# Patient Record
Sex: Female | Born: 1975 | Race: White | Hispanic: No | Marital: Married | Smoking: Current every day smoker
Health system: Southern US, Community
[De-identification: ages and names within clinical notes are randomized; demographics above are authoritative.]

## PROBLEM LIST (undated history)

## (undated) DIAGNOSIS — Z1371 Encounter for nonprocreative screening for genetic disease carrier status: Secondary | ICD-10-CM

## (undated) DIAGNOSIS — Z9221 Personal history of antineoplastic chemotherapy: Secondary | ICD-10-CM

## (undated) DIAGNOSIS — J189 Pneumonia, unspecified organism: Secondary | ICD-10-CM

## (undated) DIAGNOSIS — M5136 Other intervertebral disc degeneration, lumbar region: Secondary | ICD-10-CM

## (undated) DIAGNOSIS — M51369 Other intervertebral disc degeneration, lumbar region without mention of lumbar back pain or lower extremity pain: Secondary | ICD-10-CM

## (undated) DIAGNOSIS — IMO0002 Reserved for concepts with insufficient information to code with codable children: Secondary | ICD-10-CM

## (undated) DIAGNOSIS — I10 Essential (primary) hypertension: Secondary | ICD-10-CM

## (undated) DIAGNOSIS — Z923 Personal history of irradiation: Secondary | ICD-10-CM

## (undated) DIAGNOSIS — C50919 Malignant neoplasm of unspecified site of unspecified female breast: Secondary | ICD-10-CM

## (undated) HISTORY — PX: TUBAL LIGATION: SHX77

## (undated) HISTORY — PX: BREAST SURGERY: SHX581

## (undated) HISTORY — PX: PORT A CATH REVISION: SHX6033

## (undated) HISTORY — DX: Encounter for nonprocreative screening for genetic disease carrier status: Z13.71

## (undated) HISTORY — PX: OTHER SURGICAL HISTORY: SHX169

---

## 2004-08-23 ENCOUNTER — Emergency Department: Payer: Self-pay | Admitting: Unknown Physician Specialty

## 2005-08-21 ENCOUNTER — Ambulatory Visit: Payer: Self-pay | Admitting: Family Medicine

## 2005-09-22 ENCOUNTER — Ambulatory Visit: Payer: Self-pay | Admitting: Psychiatry

## 2006-08-03 HISTORY — PX: SPINE SURGERY: SHX786

## 2009-10-09 ENCOUNTER — Ambulatory Visit: Payer: Self-pay | Admitting: Family Medicine

## 2009-11-26 ENCOUNTER — Ambulatory Visit: Payer: Self-pay | Admitting: Family Medicine

## 2009-11-29 ENCOUNTER — Ambulatory Visit: Payer: Self-pay | Admitting: Family Medicine

## 2009-12-13 ENCOUNTER — Ambulatory Visit (HOSPITAL_COMMUNITY): Admission: RE | Admit: 2009-12-13 | Discharge: 2009-12-13 | Payer: Self-pay | Admitting: Neurosurgery

## 2010-01-05 ENCOUNTER — Inpatient Hospital Stay (HOSPITAL_COMMUNITY): Admission: EM | Admit: 2010-01-05 | Discharge: 2010-01-09 | Payer: Self-pay | Admitting: Neurosurgery

## 2010-05-09 ENCOUNTER — Encounter: Admission: RE | Admit: 2010-05-09 | Discharge: 2010-05-09 | Payer: Self-pay | Admitting: Neurosurgery

## 2010-10-20 LAB — BASIC METABOLIC PANEL
BUN: 5 mg/dL — ABNORMAL LOW (ref 6–23)
Calcium: 9.4 mg/dL (ref 8.4–10.5)
Creatinine, Ser: 0.65 mg/dL (ref 0.4–1.2)
GFR calc Af Amer: >60 mL/min (ref >60)
GFR calc non Af Amer: >60 mL/min (ref >60)

## 2010-10-20 LAB — CBC
HCT: 42.6 % (ref 36.0–46.0)
Hemoglobin: 14.5 g/dL (ref 12.0–15.0)
MCHC: 34.1 g/dL (ref 30.0–36.0)
MCV: 95.7 fL (ref 78.0–100.0)
Platelets: 227 10*3/uL (ref 150–400)
RBC: 4.46 MIL/uL (ref 3.87–5.11)
RDW: 15.3 % (ref 11.5–15.5)
WBC: 14.4 10*3/uL — ABNORMAL HIGH (ref 4.0–10.5)

## 2010-10-20 LAB — DIFFERENTIAL
Eosinophils Absolute: 0.1 10*3/uL (ref 0.0–0.7)
Lymphs Abs: 2.5 10*3/uL (ref 0.7–4.0)
Monocytes Relative: 6 % (ref 3–12)
Neutro Abs: 10.9 10*3/uL — ABNORMAL HIGH (ref 1.7–7.7)
Neutrophils Relative %: 75 % (ref 43–77)

## 2010-10-20 LAB — PROTIME-INR
INR: 0.91 (ref 0.00–1.49)
Prothrombin Time: 12.2 seconds (ref 11.6–15.2)

## 2010-10-20 LAB — APTT: aPTT: 27 seconds (ref 24–37)

## 2010-10-21 LAB — CBC
HCT: 43.3 % (ref 36.0–46.0)
Hemoglobin: 14.9 g/dL (ref 12.0–15.0)
MCHC: 34.3 g/dL (ref 30.0–36.0)
MCV: 95.9 fL (ref 78.0–100.0)
Platelets: 216 10*3/uL (ref 150–400)
RBC: 4.51 MIL/uL (ref 3.87–5.11)
RDW: 14.5 % (ref 11.5–15.5)
WBC: 10.6 10*3/uL — ABNORMAL HIGH (ref 4.0–10.5)

## 2010-10-21 LAB — SURGICAL PCR SCREEN
MRSA, PCR: NEGATIVE
Staphylococcus aureus: NEGATIVE

## 2010-10-27 ENCOUNTER — Encounter: Payer: Commercial Indemnity | Attending: Physical Medicine & Rehabilitation

## 2010-10-27 ENCOUNTER — Ambulatory Visit (HOSPITAL_BASED_OUTPATIENT_CLINIC_OR_DEPARTMENT_OTHER): Payer: Commercial Indemnity | Admitting: Physical Medicine & Rehabilitation

## 2010-10-27 DIAGNOSIS — M961 Postlaminectomy syndrome, not elsewhere classified: Secondary | ICD-10-CM

## 2010-10-27 DIAGNOSIS — IMO0002 Reserved for concepts with insufficient information to code with codable children: Secondary | ICD-10-CM

## 2010-10-27 DIAGNOSIS — F17200 Nicotine dependence, unspecified, uncomplicated: Secondary | ICD-10-CM | POA: Insufficient documentation

## 2010-10-27 DIAGNOSIS — G8929 Other chronic pain: Secondary | ICD-10-CM | POA: Insufficient documentation

## 2010-10-27 DIAGNOSIS — Z79899 Other long term (current) drug therapy: Secondary | ICD-10-CM | POA: Insufficient documentation

## 2010-10-27 DIAGNOSIS — M79609 Pain in unspecified limb: Secondary | ICD-10-CM | POA: Insufficient documentation

## 2010-10-27 DIAGNOSIS — M545 Low back pain, unspecified: Secondary | ICD-10-CM | POA: Insufficient documentation

## 2010-11-04 NOTE — Assessment & Plan Note (Signed)
REASON FOR VISIT:  Back pain and left lower extremity pain.  HISTORY OF PRESENT ILLNESS:  This is a 35 year old female with greater than 1-year history of lumbar pain and lower extremity pain.  She underwent conservative care including physical therapy and medications including narcotic analgesics, nonnarcotic, analgesics, NSAIDS, and Neurontin prior to undergoing a left L5-S1 laminectomy and diskectomy for herniated nucleus pulposus on Dec 13, 2009.  She had a recurrent disk at that same level and underwent an L5-S1 fusion on January 07, 2010. Postoperatively, she has had continued pain which she now grades as 6/10.  Her pain is described as sharp and constant aching.  She states that when her back pain really flares up her left lower extremity pain starts up again as well.  Her pain interferes with activities at a moderate level.  Sleep is fair.  Pain is worse with walking, bending, sitting, and standing and improves with rest, heat, and medications. Relief from meds is good.  The patient can walk without assistant, climb steps, and drives.  She walks 10 minutes at a time.  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  She needs help with certain meal prep, household duties, and shopping.  REVIEW OF SYSTEMS:  Positive for spasms in the low back area.  SOCIAL HISTORY:  Lives with her husband and 4 children.  Smokes 1 pack per day, but is down from a pack and half.  I reviewed her C-spine CT which showed moderate bulge at C5-6.  I reviewed her CT myelogram of the lumbosacral spine.  No recurrent disk.  This was done in November 2011.  Slight anterolisthesis 3-mm at L5 on S1.  Fusion appears to be intact.  PHYSICAL EXAMINATION:  VITAL SIGNS:  Blood pressure 116/76, pulse 85, respirations 18, and sat 98% on room air. GENERAL:  In no acute stress.  Mood and affect appropriate. BACK:  No tenderness to palpation except right around the scar location. NEUROLOGIC:  Straight leg  raising test is negative.  She has decreased bilateral S1 deep tendon reflexes, decreased left S1 sensory deficit. Upper extremity strength, range of motion, and sensation are all intact. Deep tendon reflexes are normal in the upper extremities.  IMPRESSION: 1. Left S1 chronic radiculopathy. 2. Lumbar postlaminectomy syndrome with chronic back pain as well as     lower extremity pain, 3. Narcotic analgesic dependent.  She would like to be off narcotic     analgesics be managed just on ibuprofen.  1. We discussed medication management.  We will need to get a urine     drug screen before I can prescribe any narcotic analgesics.  She     just had her hydrocodone refilled by Dr. Jeral Fruit and should have 90     tablets.  She still has two morphine 2-mg tablets left.  She has     been taking these 1 tablet twice a day with 3 times a day of     hydrocodone.  I have instructed her to take 1 tablet of     hydromorphone b.i.d. for a day along with t.i.d. hydrocodone and     then stop the hydromorphone and switch to hydrocodone 10/325 q.i.d.     thereafter. 2. I instructed her to stop the meloxicam she is on this as well as     the ibuprofen.  I have written a prescription to increase ibuprofen     to 800 t.i.d. 3. We discussed the diazepam, goal would be  to discontinue this.  We     will put her on a wean of this once again after UDS comes back, she     is on 5 mg t.i.d. 4. She will continue on her bupropion, she is on this for smoking     cessation by Dr. Elizabeth Sauer.  I discussed with the patient and     agrees with plan.  I will see her back for the epidural injection     that were planning at S1.     This will be part of the overall goal to employ a multimodal     approach and reduce narcotic analgesics hopefully in the next 2     months and discontinue.     Erick Colace, M.D. Electronically Signed    AEK/MedQ D:  10/27/2010 16:21:10  T:  10/28/2010 00:04:16  Job #:   [id number]  cc:   Duanne Limerick, MD [address] [place], [place] [address]

## 2010-12-01 ENCOUNTER — Ambulatory Visit (HOSPITAL_BASED_OUTPATIENT_CLINIC_OR_DEPARTMENT_OTHER): Payer: Commercial Indemnity | Admitting: Physical Medicine & Rehabilitation

## 2010-12-01 ENCOUNTER — Encounter: Payer: Commercial Indemnity | Attending: Physical Medicine & Rehabilitation

## 2010-12-01 DIAGNOSIS — Z79899 Other long term (current) drug therapy: Secondary | ICD-10-CM | POA: Insufficient documentation

## 2010-12-01 DIAGNOSIS — M545 Low back pain, unspecified: Secondary | ICD-10-CM | POA: Insufficient documentation

## 2010-12-01 DIAGNOSIS — M79609 Pain in unspecified limb: Secondary | ICD-10-CM | POA: Insufficient documentation

## 2010-12-01 DIAGNOSIS — M961 Postlaminectomy syndrome, not elsewhere classified: Secondary | ICD-10-CM | POA: Insufficient documentation

## 2010-12-01 DIAGNOSIS — G8929 Other chronic pain: Secondary | ICD-10-CM | POA: Insufficient documentation

## 2010-12-01 DIAGNOSIS — F17200 Nicotine dependence, unspecified, uncomplicated: Secondary | ICD-10-CM | POA: Insufficient documentation

## 2010-12-01 DIAGNOSIS — IMO0002 Reserved for concepts with insufficient information to code with codable children: Secondary | ICD-10-CM | POA: Insufficient documentation

## 2010-12-01 NOTE — Procedures (Signed)
NAMEBRYLAN, Elizabeth Torres                 ACCOUNT NO.:  [id number]  MEDICAL RECORD NO.:  [id number]           PATIENT TYPE:  O  LOCATION:  [facility]                          FACILITY:  [facility]  PHYSICIAN:  Erick Colace, M.D.DATE OF BIRTH:  12/28/1975  DATE OF PROCEDURE: DATE OF DISCHARGE:  10/27/2010                              OPERATIVE REPORT  PROCEDURE:  Left S1 transforaminal lumbar epidural steroid injection under fluoroscopic guidance.  INDICATION:  Chronic left S1 radiculitis, history of L5-S1 laminectomy, left side postoperative radicular pain.  Informed consent was obtained after describing risks and benefits of the procedure with the patient.  These include bleeding, bruising, and infection.  She elects to proceed and has given written consent.  The patient was placed prone on fluoroscopy table.  Betadine prep, sterile drape, 25-gauge inch and half needle was used to anesthetize the skin and subcutaneous tissue with 1% lidocaine x2 mL.  Then a 22-gauge 3-1/2- inch spinal needle was inserted fluoroscopic guidance, left S1 foramen. AP, lateral, and oblique images utilized.  Omnipaque 180 under live fluoro demonstrated no intravascular uptake.  Then 1 mL of 10 mg/mL of dexamethasone and 2 mL of 1% MPF lidocaine were injected.  The patient tolerated procedure well.  Postprocedure instructions given.  Return in 1 month.     Erick Colace, M.D. Electronically Signed    AEK/MEDQ  D:  12/01/2010 11:18:44  T:  12/01/2010 23:26:52  Job:  [id number]

## 2010-12-15 ENCOUNTER — Emergency Department (HOSPITAL_COMMUNITY)
Admission: EM | Admit: 2010-12-15 | Discharge: 2010-12-15 | Disposition: A | Discharge status: Home / Self Care | Payer: BC Managed Care – PPO | Attending: Emergency Medicine | Admitting: Emergency Medicine

## 2010-12-15 DIAGNOSIS — IMO0002 Reserved for concepts with insufficient information to code with codable children: Secondary | ICD-10-CM | POA: Insufficient documentation

## 2010-12-15 DIAGNOSIS — M545 Low back pain, unspecified: Secondary | ICD-10-CM | POA: Insufficient documentation

## 2010-12-15 DIAGNOSIS — M79609 Pain in unspecified limb: Secondary | ICD-10-CM | POA: Insufficient documentation

## 2010-12-15 DIAGNOSIS — Z9889 Other specified postprocedural states: Secondary | ICD-10-CM | POA: Insufficient documentation

## 2010-12-15 DIAGNOSIS — Z79899 Other long term (current) drug therapy: Secondary | ICD-10-CM | POA: Insufficient documentation

## 2010-12-18 ENCOUNTER — Ambulatory Visit (HOSPITAL_BASED_OUTPATIENT_CLINIC_OR_DEPARTMENT_OTHER): Payer: Commercial Indemnity | Admitting: Physical Medicine & Rehabilitation

## 2010-12-18 ENCOUNTER — Encounter: Payer: BC Managed Care – PPO | Attending: Physical Medicine & Rehabilitation

## 2010-12-18 DIAGNOSIS — M545 Low back pain, unspecified: Secondary | ICD-10-CM | POA: Insufficient documentation

## 2010-12-18 DIAGNOSIS — IMO0002 Reserved for concepts with insufficient information to code with codable children: Secondary | ICD-10-CM | POA: Insufficient documentation

## 2010-12-18 DIAGNOSIS — Z79899 Other long term (current) drug therapy: Secondary | ICD-10-CM | POA: Insufficient documentation

## 2010-12-18 DIAGNOSIS — G8929 Other chronic pain: Secondary | ICD-10-CM | POA: Insufficient documentation

## 2010-12-18 DIAGNOSIS — F17200 Nicotine dependence, unspecified, uncomplicated: Secondary | ICD-10-CM | POA: Insufficient documentation

## 2010-12-18 DIAGNOSIS — G544 Lumbosacral root disorders, not elsewhere classified: Secondary | ICD-10-CM

## 2010-12-18 DIAGNOSIS — M961 Postlaminectomy syndrome, not elsewhere classified: Secondary | ICD-10-CM | POA: Insufficient documentation

## 2010-12-18 DIAGNOSIS — M79609 Pain in unspecified limb: Secondary | ICD-10-CM | POA: Insufficient documentation

## 2010-12-18 NOTE — Procedures (Signed)
Elizabeth Torres, Elizabeth Torres                 ACCOUNT NO.:  [id number]  MEDICAL RECORD NO.:  [id number]           PATIENT TYPE:  O  LOCATION:  [facility]                          FACILITY:  [facility]  PHYSICIAN:  Erick Colace, M.D.DATE OF BIRTH:  27-Jan-1976  DATE OF PROCEDURE: DATE OF DISCHARGE:                              OPERATIVE REPORT  PROCEDURE:  This is a left S1 transforaminal lumbar epidural steroid injection under fluoroscopic guidance.  INDICATION:  Left S1 radiculitis previously relieved for almost 2 months with epidural injection, now recurrence after unusual activities.  Informed consent was obtained after describing risks and benefits of the procedure with the patient.  These include bleeding, bruising, and infection, she elects proceed and has given written consent.  The patient placed prone on fluoroscopy table.  Betadine prep, sterile drape, a 25-gauge inch and half needle was used to anesthetize skin and subcu tissue 1% lidocaine x2 mL.  Then a 22-gauge 3-1/2-inch spinal needle was inserted under fluoroscopic guidance, left S1 foramen, AP and lateral images utilized.  Omnipaque 180 under live fluoro demonstrated no intravascular uptake.  Then 1 mL of 10 mg/mL dexamethasone, 2 mL of 1% MPF lidocaine were injected.  The patient tolerated procedure well. Postprocedure instructions given.     Erick Colace, M.D. Electronically Signed    AEK/MEDQ  D:  12/18/2010 10:54:14  T:  12/18/2010 22:01:49  Job:  [id number]

## 2011-01-05 ENCOUNTER — Ambulatory Visit: Payer: Commercial Indemnity | Admitting: Physical Medicine & Rehabilitation

## 2011-01-19 ENCOUNTER — Ambulatory Visit: Payer: BC Managed Care – PPO | Admitting: Physical Medicine & Rehabilitation

## 2011-02-23 ENCOUNTER — Ambulatory Visit (HOSPITAL_BASED_OUTPATIENT_CLINIC_OR_DEPARTMENT_OTHER): Payer: BC Managed Care – PPO | Admitting: Physical Medicine & Rehabilitation

## 2011-02-23 ENCOUNTER — Encounter: Payer: BC Managed Care – PPO | Attending: Physical Medicine & Rehabilitation

## 2011-02-23 DIAGNOSIS — M545 Low back pain, unspecified: Secondary | ICD-10-CM | POA: Insufficient documentation

## 2011-02-23 DIAGNOSIS — F17200 Nicotine dependence, unspecified, uncomplicated: Secondary | ICD-10-CM | POA: Insufficient documentation

## 2011-02-23 DIAGNOSIS — IMO0002 Reserved for concepts with insufficient information to code with codable children: Secondary | ICD-10-CM

## 2011-02-23 DIAGNOSIS — Z79899 Other long term (current) drug therapy: Secondary | ICD-10-CM | POA: Insufficient documentation

## 2011-02-23 DIAGNOSIS — G8929 Other chronic pain: Secondary | ICD-10-CM | POA: Insufficient documentation

## 2011-02-23 DIAGNOSIS — M79609 Pain in unspecified limb: Secondary | ICD-10-CM | POA: Insufficient documentation

## 2011-02-23 DIAGNOSIS — M961 Postlaminectomy syndrome, not elsewhere classified: Secondary | ICD-10-CM | POA: Insufficient documentation

## 2011-02-24 NOTE — Assessment & Plan Note (Signed)
A 34 year old female, she has chronic radicular discomfort.  She has had previous lumbar spine surgery.  She has had good results with transforaminal injections S1 distribution.  Her pain medications are hydrocodone 10/325 q.i.d.  She still had a few Dilaudid 2 mg dose as left over which she is taking about 2-3 times per week.  She really feels that is mainly for her lower extremity symptoms.  However, she does not have these symptoms on a consistent basis.  Her average pain is 4-5/10.  Her sleep is fair.  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  Her main limitations are uncertain household duties and shopping.  Her pain is mainly in the lower extremities when it does occur less in her back.  Blood pressure 117/74, pulse 88, respirations 18 and O2 sat 98% on room air.  General, no acute distress.  Mood and affect appropriate. Negative straight leg raising test.  Deep tendon reflexes bilateral S1 reduced.  Motor strength is normal bilateral lower extremity.  No calf atrophy.  IMPRESSION:  S1 radiculitis mainly on the left side greater than right side, has a good results with transforaminal lumbar epidural steroid injection.  We will repeat in 1 month.  It is starting to wear off.  We will continue hydrocodone.  She is now out of her hydromorphone, I will not restart this.  I have given her an extra 15 tablets of the hydrocodone.  She can take 0.5 tablet 3 times a week as needed for her "bad days."  If this is not particularly helpful, we will start her on a regular dose of her gabapentin.  Discussed with the patient, agrees with plan.     Erick Colace, M.D. Electronically Signed    AEK/MedQ D:  02/23/2011 14:42:40  T:  02/24/2011 00:04:09  Job #:  [id number]  cc:   Hilda Lias, M.D. Fax: [phone]

## 2011-03-30 ENCOUNTER — Encounter: Payer: BC Managed Care – PPO | Attending: Physical Medicine & Rehabilitation

## 2011-03-30 ENCOUNTER — Ambulatory Visit (HOSPITAL_BASED_OUTPATIENT_CLINIC_OR_DEPARTMENT_OTHER): Payer: BC Managed Care – PPO | Admitting: Physical Medicine & Rehabilitation

## 2011-03-30 DIAGNOSIS — M545 Low back pain, unspecified: Secondary | ICD-10-CM | POA: Insufficient documentation

## 2011-03-30 DIAGNOSIS — M79609 Pain in unspecified limb: Secondary | ICD-10-CM | POA: Insufficient documentation

## 2011-03-30 DIAGNOSIS — IMO0002 Reserved for concepts with insufficient information to code with codable children: Secondary | ICD-10-CM | POA: Insufficient documentation

## 2011-03-30 DIAGNOSIS — F17200 Nicotine dependence, unspecified, uncomplicated: Secondary | ICD-10-CM | POA: Insufficient documentation

## 2011-03-30 DIAGNOSIS — M961 Postlaminectomy syndrome, not elsewhere classified: Secondary | ICD-10-CM | POA: Insufficient documentation

## 2011-03-30 DIAGNOSIS — G8929 Other chronic pain: Secondary | ICD-10-CM | POA: Insufficient documentation

## 2011-03-30 DIAGNOSIS — Z79899 Other long term (current) drug therapy: Secondary | ICD-10-CM | POA: Insufficient documentation

## 2011-04-01 NOTE — Procedures (Signed)
NAMEBILL, MCVEY                 ACCOUNT NO.:  [id number]  MEDICAL RECORD NO.:  [id number]           PATIENT TYPE:  LOCATION:                                 FACILITY:  PHYSICIAN:  Erick Colace, M.D.   DATE OF BIRTH:  DATE OF PROCEDURE: DATE OF DISCHARGE:                              OPERATIVE REPORT  PROCEDURE:  Left S1 transforaminal lumbar epidural steroid injection under fluoroscopic guidance.  INDICATIONS:  Lumbosacral neuritis, left S1 has had good relief with prior injections performed on December 01, 2010 as well as Dec 18, 2010, now having recurrence.  The patient has only partially relieved with medication management including narcotic analgesics.  Her pain does interfere with self-care mobility and is described as severe.  Informed consent was obtained after describing risks and benefits of the procedure with the patient.  These include bleeding, bruising, and infection.  She elects to proceed and has given written consent.  The patient was placed prone on fluoroscopy table.  Betadine prep, sterile drape, 25-gauge inch and half needle was used to anesthetize the skin and subcutaneous tissue with 1% lidocaine x2 mL.  Then, a 22 gauge 3-1/2 inches spinal needle was inserted under fluoroscopic guidance, starting the left S1 foramen.  AP, lateral, and oblique images were utilized. Omnipaque 180 under live fluoro demonstrated no intravascular uptake and a good epidural and nerve root spread followed by injection of 1 mL of 10 mg/mL dexamethasone and 2 mL of 1% MPF lidocaine.  The patient tolerated the procedure well.  Postprocedure instructions were given.     Erick Colace, M.D. Electronically Signed    AEK/MEDQ  D:  03/30/2011 10:39:42  T:  03/30/2011 13:16:23  Job:  [id number]

## 2011-06-10 ENCOUNTER — Ambulatory Visit: Payer: Self-pay | Admitting: Family Medicine

## 2011-06-23 ENCOUNTER — Ambulatory Visit: Payer: Self-pay | Admitting: Family Medicine

## 2011-06-23 ENCOUNTER — Ambulatory Visit: Payer: Self-pay | Admitting: Surgery

## 2011-06-29 ENCOUNTER — Encounter: Payer: BC Managed Care – PPO | Attending: Physical Medicine & Rehabilitation

## 2011-06-29 ENCOUNTER — Ambulatory Visit (HOSPITAL_BASED_OUTPATIENT_CLINIC_OR_DEPARTMENT_OTHER): Payer: BC Managed Care – PPO | Admitting: Physical Medicine & Rehabilitation

## 2011-06-29 DIAGNOSIS — M549 Dorsalgia, unspecified: Secondary | ICD-10-CM | POA: Insufficient documentation

## 2011-06-29 DIAGNOSIS — Z981 Arthrodesis status: Secondary | ICD-10-CM | POA: Insufficient documentation

## 2011-06-29 DIAGNOSIS — M961 Postlaminectomy syndrome, not elsewhere classified: Secondary | ICD-10-CM | POA: Insufficient documentation

## 2011-06-29 DIAGNOSIS — M79609 Pain in unspecified limb: Secondary | ICD-10-CM | POA: Insufficient documentation

## 2011-06-29 DIAGNOSIS — IMO0002 Reserved for concepts with insufficient information to code with codable children: Secondary | ICD-10-CM

## 2011-06-29 NOTE — Assessment & Plan Note (Signed)
HISTORY:  A 35 year old female with chronic left lower extremity pain. She had a 3 day relief with left S1 transforaminal lumbar epidural steroid injection under fluoroscopic guidance on March 30, 2011.  Her pain went away completely, previous injections S1 were also affected but did not last very long.  Previous injections lasted perhaps 1-2 months.  She has had no other new medical problems in the interval time.  PAST MEDICAL HISTORY:  Significant for chronic back pain and left lower extremity pain.  PAST SURGICAL HISTORY:  L5-S1 fusion on January 07, 2010.  CT myelogram in November 2011 shows slight anterolisthesis 3 mm at L5 on S1, otherwise fusion appeared to be intact.  MEDICATIONS: 1. Ibuprofen 800 t.i.d. 2. Hydrocodone 10/325, 4 or 5 times per day. 3. Flexeril 10 mg b.i.d. p.r.n.  PHYSICAL EXAMINATION:  GENERAL:  No acute distress.  Mood and affect appropriate. EXTREMITIES:  Straight leg raising test is negative.  She is able to toe walk and heel walk.  Her strength is normal in lower extremities.  Her back has some tenderness at the lumbosacral junction bilaterally.  IMPRESSION:  Lumbar post laminectomy syndrome with chronic left S1 radicular pain as well as chronic back pain.  RECOMMENDATIONS:  We had a long discussion regarding her treatment options.  Certainly if she only has a very temporary relief with her S1 transforaminal injections, I really do not see there is any reason to repeat at this point.  We also discussed her other treatment options and specifically a spinal cord stimulation.  I went over potential risks and benefits, at least in general but indicated that physician who actually performs these procedures would be better to go over this in more detail with her.  I did give her a DVD explaining the trial as well as the actual procedure.  I did explain the process in general terms.  I answered her questions.  Her husband within the room with [place].  We  will continue her current medications.  I did bump up her hydrocodone from 135 tablets per month to 150 tablets per month.  So she could take it more consistently 5 times per day.  Overall goal will be to reduce this if she had a good result with a spinal cord stimulation.  We will continue her ibuprofen 800 t.i.d., as well as her Flexeril which she only takes on a p.r.n. basis. I told her not to take any extra hydrocodone on bad days but instead rely on her Flexeril.  I discussed this at least 30 minutes visit, 50% counseling and coordination of care.  No injection today.  I will see her back in 2 months.     Erick Colace, M.D. Electronically Signed    AEK/MedQ D:  06/29/2011 10:56:53  T:  06/29/2011 14:04:30  Job #:  [id number]  cc:   Delano Metz Fax: [phone]  Dr. Elizabeth Sauer

## 2011-07-04 HISTORY — PX: BREAST BIOPSY: SHX20

## 2011-07-21 ENCOUNTER — Ambulatory Visit: Payer: Self-pay | Admitting: Surgery

## 2011-07-24 ENCOUNTER — Ambulatory Visit: Payer: Self-pay | Admitting: Oncology

## 2011-08-03 ENCOUNTER — Ambulatory Visit: Payer: Self-pay | Admitting: Oncology

## 2011-08-04 DIAGNOSIS — Z923 Personal history of irradiation: Secondary | ICD-10-CM

## 2011-08-04 DIAGNOSIS — Z9221 Personal history of antineoplastic chemotherapy: Secondary | ICD-10-CM

## 2011-08-04 DIAGNOSIS — IMO0001 Reserved for inherently not codable concepts without codable children: Secondary | ICD-10-CM

## 2011-08-04 DIAGNOSIS — C50919 Malignant neoplasm of unspecified site of unspecified female breast: Secondary | ICD-10-CM

## 2011-08-04 HISTORY — DX: Personal history of antineoplastic chemotherapy: Z92.21

## 2011-08-04 HISTORY — DX: Reserved for inherently not codable concepts without codable children: IMO0001

## 2011-08-04 HISTORY — PX: BREAST LUMPECTOMY: SHX2

## 2011-08-04 HISTORY — DX: Malignant neoplasm of unspecified site of unspecified female breast: C50.919

## 2011-08-04 HISTORY — DX: Personal history of irradiation: Z92.3

## 2011-08-07 ENCOUNTER — Ambulatory Visit: Payer: Self-pay | Admitting: Oncology

## 2011-08-07 ENCOUNTER — Other Ambulatory Visit: Payer: Self-pay | Admitting: *Deleted

## 2011-08-07 DIAGNOSIS — C50511 Malignant neoplasm of lower-outer quadrant of right female breast: Secondary | ICD-10-CM

## 2011-08-07 DIAGNOSIS — C50519 Malignant neoplasm of lower-outer quadrant of unspecified female breast: Secondary | ICD-10-CM

## 2011-08-07 HISTORY — DX: Malignant neoplasm of lower-outer quadrant of right female breast: C50.511

## 2011-08-10 ENCOUNTER — Ambulatory Visit: Payer: BC Managed Care – PPO

## 2011-08-10 ENCOUNTER — Other Ambulatory Visit (HOSPITAL_BASED_OUTPATIENT_CLINIC_OR_DEPARTMENT_OTHER): Payer: BC Managed Care – PPO | Admitting: Lab

## 2011-08-10 ENCOUNTER — Ambulatory Visit (HOSPITAL_BASED_OUTPATIENT_CLINIC_OR_DEPARTMENT_OTHER): Payer: BC Managed Care – PPO | Admitting: Oncology

## 2011-08-10 VITALS — BP 118/81 | HR 85 | Temp 98.2°F | Ht 65.0 in | Wt 147.2 lb

## 2011-08-10 DIAGNOSIS — C50919 Malignant neoplasm of unspecified site of unspecified female breast: Secondary | ICD-10-CM

## 2011-08-10 DIAGNOSIS — C50519 Malignant neoplasm of lower-outer quadrant of unspecified female breast: Secondary | ICD-10-CM

## 2011-08-10 LAB — COMPREHENSIVE METABOLIC PANEL
ALT: 22 U/L (ref 0–35)
AST: 21 U/L (ref 0–37)
Albumin: 4.1 g/dL (ref 3.5–5.2)
Alkaline Phosphatase: 68 U/L (ref 39–117)
CO2: 28 mEq/L (ref 19–32)
Chloride: 102 mEq/L (ref 96–112)
Glucose, Bld: 68 mg/dL — ABNORMAL LOW (ref 70–99)
Potassium: 3.7 mEq/L (ref 3.5–5.3)
Sodium: 139 mEq/L (ref 135–145)
Total Bilirubin: 0.1 mg/dL — ABNORMAL LOW (ref 0.3–1.2)
Total Protein: 7.1 g/dL (ref 6.0–8.3)

## 2011-08-10 LAB — CBC WITH DIFFERENTIAL/PLATELET
BASO%: 0.4 % (ref 0.0–2.0)
Basophils Absolute: 0 10*3/uL (ref 0.0–0.1)
EOS%: 7.3 % — ABNORMAL HIGH (ref 0.0–7.0)
Eosinophils Absolute: 0.7 10*3/uL — ABNORMAL HIGH (ref 0.0–0.5)
HGB: 13.6 g/dL (ref 11.6–15.9)
LYMPH%: 34.4 % (ref 14.0–49.7)
MCH: 32 pg (ref 25.1–34.0)
MCHC: 33.5 g/dL (ref 31.5–36.0)
MCV: 95.5 fL (ref 79.5–101.0)
MONO#: 0.6 10*3/uL (ref 0.1–0.9)
MONO%: 5.9 % (ref 0.0–14.0)
NEUT#: 4.9 10*3/uL (ref 1.5–6.5)
NEUT%: 52 % (ref 38.4–76.8)
Platelets: 237 10*3/uL (ref 145–400)
RBC: 4.26 10*6/uL (ref 3.70–5.45)
RDW: 14 % (ref 11.2–14.5)
WBC: 9.5 10*3/uL (ref 3.9–10.3)

## 2011-08-10 LAB — CANCER ANTIGEN 27.29: CA 27.29: 23 U/mL (ref 0–39)

## 2011-08-10 NOTE — Progress Notes (Signed)
Elizabeth Torres  DOB: 04-05-1976  MR#: [id number]  CSN#: [id number]    History of present illness:   Elizabeth Torres is a 36 year-old [facility] woman seeking a second opinion in our clinic regarding newly diagnosed breast cancer.  She had negative mammography April of 2011. More recently, she palpated a mass in her right breast. She brought this to the attention of her primary care physician Dr. Elizabeth Sauer, and he set the patient up for bilateral diagnostic mammography [facility] 06[facility]10/[facility]. The mammographer describes the breasts as moderately dense to dense. Compression views showed no definite malignant features or calcifications. Ultrasonography showed more than 1 area which were hypoechoic, one measuring 0.88 and a second one 0.93 centimeters. Taking both areas together the entire length was 1.75 cm.  Biopsy was suggested, and performed by Dr. Dionne Milo 07/21/2011. The pathology from this lesion ([id number]) showed an invasive ductal carcinoma, grade 3, which was estrogen and progesterone receptor negative at 0%, and showed no amplification of HER-2 by FISH, with a ratio of 1.08. Margins were negative for the invasive component, at 1.4 mm, but there was focal positivity for DCIS in at least one margin.  The patient has met with surgery and medical oncology in [place]. She is here to discuss her disease in general and treatment options.  Past medical history:     History of tobacco abuse; history of degenerative disc disease  Past surgical history:   Status post lumbar laminectomy x2 under Dr. Jeral Fruit  Family history:    The patient's father is alive at age 36, her mother is alive at age 19. She has 2 brothers and one sister. There is no history of cancer in the immediate family. One of 5 paternal siblings (1 of the patient's aunts) had jaw surgery remotely for a tumor.  Gynecologic history: Menarche age 43, first pregnancy to term age 75. The patient is GX P4, last menstrual  period started December 23. 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 Her husband, Wade Sigala, present today, works as a Naval architect. At home they live with their children ages 1357 and 2 year old twins. Cliff's son from an earlier marriage, age 51, is currently at home as well. The patient is not a church attender.    ADVANCED DIRECTIVES:  Health maintenance:       History  Substance Use Topics  . Smoking status: Not on file  . Smokeless tobacco: Not on file  . Alcohol Use: Not on file   the patient smokes one to one and a half packs daily and has since age 76. She is "thinking of quitting", but does not wish to pursue this at present. She drinks 12 beers over 2 days on weekends.    Colonoscopy: never  PAP: Up-to-date, December 2012  Bone density: Never  Cholesterol: "Fine"  Review of systems:  She has had significant bleeding and bruising since her excisional biopsy. Pain and has been moderate. There has been no fever. She has chronic low back pain, treated with medication 3 times a day. She thinks she may have a heart murmur but is not sure. Currently she has a slight cough which is minimally productive. A detailed review of systems was otherwise noncontributory.  Allergies:     Allergies  Allergen Reactions  . Penicillins Anaphylaxis  . Sulfa Antibiotics Anaphylaxis    Medications:  Current Outpatient Prescriptions  Medication Sig Dispense Refill  . diazepam (VALIUM) 5 MG tablet Take 5 mg by mouth daily as needed.        Marland Kitchen HYDROcodone-acetaminophen (NORCO) 10-325 MG per tablet Take 1 tablet by mouth every 8 (eight) hours as needed.        Marland Kitchen ibuprofen (ADVIL,MOTRIN) 800 MG tablet Take 800 mg by mouth.          Physical exam:  She is a young white female in no acute distress    Filed Vitals:   08/10/11 1644  BP: 118/81  Pulse: 85  Temp: 98.2 F (36.8 C)     Body mass index is 24.50  kg/(m^2).  ECOG PS:  Sclerae unicteric Oropharynx clear No peripheral adenopathy, in particular detailed examination of the right exam was negative Lungs no rales or rhonchi Heart regular rate and rhythm,  no murmur appreciated Abd benign MSK no focal spinal tenderness; there is a laminectomy scar in the lumbar area; there is some scoliosis; no peripheral edema Neuro: nonfocal Breasts: The right breast is partly bandaged. She did not want this uncovered because "it will gush out." The left breast was unremarkable   Lab results:      CBC  Lab 08/10/11 1631  WBC 9.5  HGB 13.6  HCT 40.7  PLT 237  MCV 95.5  MCH 32.0  MCHC 33.5  RDW 14.0  LYMPHSABS 3.3  MONOABS 0.6  EOSABS 0.7*  BASOSABS 0.0  BANDABS --    Chemistries   Lab 08/10/11 1631  NA 139  K 3.7  CL 102  CO2 28  GLUCOSE 68*  BUN 13  CREATININE 0.75  CALCIUM 9.6  MG --    GFR Estimated Creatinine Clearance: 89.2 ml/min (by C-G formula based on Cr of 0.75).  Coagulation profile No results found for this basename: INR:5,PROTIME:5 in the last 168 hours  Cardiac Enzymes No results found for this basename: CK:3,CKMB:3,TROPONINI:3,MYOGLOBIN:3 in the last 168 hours  No components found with this basename: POCBNP:3 No results found for this basename: DDIMER:2 in the last 72 hours No results found for this basename: HGBA1C:2 in the last 72 hours No results found for this basename: CHOL:2,HDL:2,LDLCALC:2,TRIG:2,CHOLHDL:2,LDLDIRECT:2 in the last 72 hours No results found for this basename: TSH,T4TOTAL,FREET3,T3FREE,THYROIDAB in the last 72 hours No results found for this basename: VITAMINB12:2,FOLATE:2,FERRITIN:2,TIBC:2,IRON:2,RETICCTPCT:2 in the last 72 hours No results found for this basename: LIPASE:2,AMYLASE:2 in the last 72 hours  Urine Studies No results found for this basename:  UACOL:2,UAPR:2,USPG:2,UPH:2,UTP:2,UGL:2,UKET:2,UBIL:2,UHGB:2,UNIT:2,UROB:2,ULEU:2,UEPI:2,UWBC:2,URBC:2,UBAC:2,CAST:2,CRYS:2,UCOM:2,BILUA:2 in the last 72 hours  MICROBIOLOGY: No results found for this or any previous visit (from the past 240 hour(s)).      Chemistry      Assessment: 36 year old Karie Schwalbe woman status post excisional biopsy of a right breast mass, which pathologically proved to be an invasive ductal carcinoma, grade 3, triple negative, measuring 7 mm, with margins clear for the invasive component but focally involved for the in situ component.    Plan: We spent the better part of her hour-long visit today discussing breast cancer in general, then her own situation more specifically. At this point her tumor is T1c Nx, or stage I. She should have a good prognosis overall, but there is a definite risk of systemic spread and unfortunately anti-estrogens and anti-HER-2 therapy are not options in her situation. She will need chemotherapy, and I would consider specifically 4 cycles of doxorubicin/cyclophosphamide given in dose dense fashion followed by 4 cycles of dose dense paclitaxel. I did not  discuss this specific choice with the patient, because there are many options that are equally valid as far and her local medical oncologist may have a different preference. I did tell her I did not think of 4 doses of chemotherapy alone would be sufficient in my opinion.  When she has her sentinel lymph node sampling she will have further margin clearance, and a port placed. She understands she will need radiation at the completion of chemotherapy.  I believe all her questions were answered, and the information was given to her in writing. I am not making a return appointment for her here as she may prefer to followup through her local surgeon and medical and radiation oncologists. She knows we will be glad to see her at any point if in any way we can be useful.   Haylin Camilli  C 08/10/2011

## 2011-08-17 ENCOUNTER — Ambulatory Visit: Payer: Self-pay | Admitting: Surgery

## 2011-08-17 LAB — PREGNANCY, URINE: Pregnancy Test, Urine: NEGATIVE m[IU]/mL

## 2011-08-18 ENCOUNTER — Encounter: Payer: Self-pay | Admitting: *Deleted

## 2011-08-18 NOTE — Progress Notes (Signed)
Mailed after appt letter to pt. 

## 2011-08-24 ENCOUNTER — Ambulatory Visit: Payer: BC Managed Care – PPO | Admitting: Physical Medicine & Rehabilitation

## 2011-08-24 ENCOUNTER — Ambulatory Visit: Payer: Self-pay | Admitting: Oncology

## 2011-08-24 ENCOUNTER — Encounter: Payer: BC Managed Care – PPO | Attending: Physical Medicine & Rehabilitation

## 2011-08-24 DIAGNOSIS — Z981 Arthrodesis status: Secondary | ICD-10-CM | POA: Insufficient documentation

## 2011-08-24 DIAGNOSIS — M961 Postlaminectomy syndrome, not elsewhere classified: Secondary | ICD-10-CM | POA: Insufficient documentation

## 2011-08-24 DIAGNOSIS — M549 Dorsalgia, unspecified: Secondary | ICD-10-CM | POA: Insufficient documentation

## 2011-08-24 DIAGNOSIS — M79609 Pain in unspecified limb: Secondary | ICD-10-CM | POA: Insufficient documentation

## 2011-08-24 LAB — COMPREHENSIVE METABOLIC PANEL
Anion Gap: 5 — ABNORMAL LOW (ref 7–16)
BUN: 8 mg/dL (ref 7–18)
Bilirubin,Total: 0.3 mg/dL (ref 0.2–1.0)
Chloride: 104 mmol/L (ref 98–107)
Co2: 31 mmol/L (ref 21–32)
Creatinine: 0.88 mg/dL (ref 0.60–1.30)
EGFR (African American): >60
Osmolality: 278 (ref 275–301)
Potassium: 4.9 mmol/L (ref 3.5–5.1)
Sodium: 140 mmol/L (ref 136–145)
Total Protein: 7 g/dL (ref 6.4–8.2)

## 2011-08-24 LAB — CBC CANCER CENTER
Basophil #: 0 x10 3/mm (ref 0.0–0.1)
Basophil %: 0.5 %
Eosinophil #: 1.1 x10 3/mm — ABNORMAL HIGH (ref 0.0–0.7)
Eosinophil %: 12 %
HGB: 14.1 g/dL (ref 12.0–16.0)
Lymphocyte %: 29.1 %
MCH: 32.7 pg (ref 26.0–34.0)
MCHC: 34.4 g/dL (ref 32.0–36.0)
Monocyte #: 0.4 x10 3/mm (ref 0.0–0.7)
Monocyte %: 4.3 %
Neutrophil #: 5 x10 3/mm (ref 1.4–6.5)
Neutrophil %: 54.1 %
Platelet: 267 x10 3/mm (ref 150–440)
RBC: 4.32 10*6/uL (ref 3.80–5.20)
RDW: 14.4 % (ref 11.5–14.5)

## 2011-08-28 LAB — URINALYSIS, COMPLETE
Bacteria: NONE SEEN
Bilirubin,UR: NEGATIVE
Glucose,UR: NEGATIVE mg/dL (ref 0–75)
Ketone: NEGATIVE
Nitrite: NEGATIVE
Ph: 6 (ref 4.5–8.0)
Protein: NEGATIVE
RBC,UR: 2 /HPF (ref 0–5)
Specific Gravity: 1.008 (ref 1.003–1.030)
Squamous Epithelial: 3
WBC UR: <1 /HPF (ref 0–5)

## 2011-08-28 LAB — CBC CANCER CENTER
Basophil %: 0.6 %
Eosinophil %: 7 %
HCT: 37.5 % (ref 35.0–47.0)
HGB: 12.8 g/dL (ref 12.0–16.0)
Lymphocyte %: 18.1 %
MCH: 32.4 pg (ref 26.0–34.0)
Monocyte #: 0.8 x10 3/mm — ABNORMAL HIGH (ref 0.0–0.7)
Monocyte %: 5.6 %
Neutrophil #: 9.8 x10 3/mm — ABNORMAL HIGH (ref 1.4–6.5)
Neutrophil %: 68.7 %
RBC: 3.95 10*6/uL (ref 3.80–5.20)
WBC: 14.3 x10 3/mm — ABNORMAL HIGH (ref 3.6–11.0)

## 2011-08-30 LAB — URINE CULTURE

## 2011-09-03 LAB — CULTURE, BLOOD (SINGLE)

## 2011-09-04 ENCOUNTER — Ambulatory Visit: Payer: Self-pay | Admitting: Oncology

## 2011-09-14 ENCOUNTER — Ambulatory Visit: Payer: BC Managed Care – PPO | Admitting: Physical Medicine & Rehabilitation

## 2011-09-14 ENCOUNTER — Encounter: Payer: BC Managed Care – PPO | Attending: Physical Medicine & Rehabilitation

## 2011-09-14 DIAGNOSIS — M961 Postlaminectomy syndrome, not elsewhere classified: Secondary | ICD-10-CM | POA: Insufficient documentation

## 2011-09-14 DIAGNOSIS — M79609 Pain in unspecified limb: Secondary | ICD-10-CM | POA: Insufficient documentation

## 2011-09-14 DIAGNOSIS — Z981 Arthrodesis status: Secondary | ICD-10-CM | POA: Insufficient documentation

## 2011-09-14 DIAGNOSIS — M549 Dorsalgia, unspecified: Secondary | ICD-10-CM | POA: Insufficient documentation

## 2011-09-14 LAB — COMPREHENSIVE METABOLIC PANEL
Albumin: 3.8 g/dL (ref 3.4–5.0)
Alkaline Phosphatase: 74 U/L (ref 50–136)
Anion Gap: 9 (ref 7–16)
BUN: 9 mg/dL (ref 7–18)
Calcium, Total: 9.2 mg/dL (ref 8.5–10.1)
Co2: 29 mmol/L (ref 21–32)
EGFR (African American): >60
EGFR (Non-African Amer.): >60
Glucose: 91 mg/dL (ref 65–99)
Osmolality: 278 (ref 275–301)
Potassium: 4.2 mmol/L (ref 3.5–5.1)
SGOT(AST): 17 U/L (ref 15–37)
Sodium: 140 mmol/L (ref 136–145)
Total Protein: 7.2 g/dL (ref 6.4–8.2)

## 2011-09-14 LAB — CBC CANCER CENTER
Basophil %: 1.2 %
Eosinophil #: 0.7 x10 3/mm (ref 0.0–0.7)
Eosinophil %: 6.2 %
HCT: 40.7 % (ref 35.0–47.0)
HGB: 13.9 g/dL (ref 12.0–16.0)
Lymphocyte %: 17.5 %
MCH: 32.2 pg (ref 26.0–34.0)
MCV: 94 fL (ref 80–100)
Monocyte #: 0.5 x10 3/mm (ref 0.0–0.7)
Monocyte %: 4.2 %
Neutrophil #: 8.2 x10 3/mm — ABNORMAL HIGH (ref 1.4–6.5)
Neutrophil %: 70.9 %
RBC: 4.34 10*6/uL (ref 3.80–5.20)
WBC: 11.6 x10 3/mm — ABNORMAL HIGH (ref 3.6–11.0)

## 2011-09-15 ENCOUNTER — Ambulatory Visit (HOSPITAL_BASED_OUTPATIENT_CLINIC_OR_DEPARTMENT_OTHER): Payer: BC Managed Care – PPO | Admitting: Physical Medicine & Rehabilitation

## 2011-09-15 DIAGNOSIS — M961 Postlaminectomy syndrome, not elsewhere classified: Secondary | ICD-10-CM

## 2011-09-15 DIAGNOSIS — IMO0002 Reserved for concepts with insufficient information to code with codable children: Secondary | ICD-10-CM

## 2011-09-15 NOTE — Assessment & Plan Note (Signed)
A 36 year old female who I have been following for lumbar postlaminectomy syndrome with chronic severe radiculitis in the right lower extremity.  She was last seen by me on June 29, 2011.  Interval medical history positive for diagnosis of right breast carcinoma.  She has undergone lumpectomy as well as a partial mastectomy on July 26, 2011.  She is scheduled for 16 chemotherapy treatments over the next 22 weeks, followed by radiation therapy 5 days per week for 6 weeks following that.  Current pain medications include ibuprofen 800 t.i.d., hydrocodone 10/325 five times per day, and Flexeril 10 mg b.i.d. p.r.n.  PAST SURGICAL HISTORY:  L5-S1 fusion on January 07, 2010.  Last imaging studies; CT myelogram November 2011 showing slight anterolisthesis, 3 mm L5 on S1, otherwise fusion appeared to be intact.  PHYSICAL EXAMINATION:  GENERAL:  No acute distress.  Mood and affect appropriate. MUSCULOSKELETAL:  Straight leg raising test is negative.  She is able to toe walk and heel walk.  Strength is normal in the lower extremities. Back has mild tenderness at the lumbosacral junction bilaterally.  She has decreased sensation in the right S1 dermatome.  IMPRESSION:  Chronic bilateral S1 radiculitis generally worse on the left side in terms of pain although on the right side in terms of sensation.  She was previously scheduled to undergo spinal cord stimulator trial, however, because of her new diagnosis of breast carcinoma, this will be postponed.  Since her oncologist will be seeing her on a very frequent basis, I would like to turn over her prescribing the hydrocodone 10/325 five times per day to Oncology over the next 6 months or so, until the patient is stabilized from her breast cancer treatment.  Discussed with the patient, agrees with plan.  I gave her note to give to Dr. Orlie Dakin and also another prescription for hydrocodone dated today.     Erick Colace,  M.D. Electronically Signed    AEK/MedQ D:  09/15/2011 10:29:34  T:  09/15/2011 11:43:39  Job #:  [id number]  cc:   Dr. Orlie Dakin Department of Oncology [facility]

## 2011-09-21 LAB — CBC CANCER CENTER
Basophil #: 0 x10 3/mm (ref 0.0–0.1)
Eosinophil %: 19.9 %
Lymphocyte #: 1 x10 3/mm (ref 1.0–3.6)
Lymphocyte %: 21.4 %
MCV: 94 fL (ref 80–100)
Monocyte %: 9.3 %
Neutrophil %: 49.1 %
Platelet: 196 x10 3/mm (ref 150–440)
RBC: 4.16 10*6/uL (ref 3.80–5.20)
RDW: 14 % (ref 11.5–14.5)
WBC: 4.5 x10 3/mm (ref 3.6–11.0)

## 2011-09-28 LAB — CBC CANCER CENTER
Basophil #: 0 x10 3/mm (ref 0.0–0.1)
Eosinophil %: 0.8 %
MCH: 31.8 pg (ref 26.0–34.0)
MCHC: 34.1 g/dL (ref 32.0–36.0)
MCV: 93 fL (ref 80–100)
Monocyte #: 1.1 x10 3/mm — ABNORMAL HIGH (ref 0.0–0.7)
Monocyte %: 6.6 %
Neutrophil %: 78.5 %
Platelet: 273 x10 3/mm (ref 150–440)
RBC: 4.04 10*6/uL (ref 3.80–5.20)
RDW: 14.3 % (ref 11.5–14.5)
WBC: 17 x10 3/mm — ABNORMAL HIGH (ref 3.6–11.0)

## 2011-09-28 LAB — COMPREHENSIVE METABOLIC PANEL
Albumin: 3.6 g/dL (ref 3.4–5.0)
Alkaline Phosphatase: 118 U/L (ref 50–136)
Anion Gap: 11 (ref 7–16)
Calcium, Total: 8.9 mg/dL (ref 8.5–10.1)
Chloride: 103 mmol/L (ref 98–107)
Co2: 28 mmol/L (ref 21–32)
EGFR (Non-African Amer.): >60
Osmolality: 281 (ref 275–301)
Potassium: 3.7 mmol/L (ref 3.5–5.1)
SGOT(AST): 13 U/L — ABNORMAL LOW (ref 15–37)

## 2011-10-02 ENCOUNTER — Ambulatory Visit: Payer: Self-pay | Admitting: Oncology

## 2011-10-05 LAB — CBC CANCER CENTER
Eosinophil #: 0.1 x10 3/mm (ref 0.0–0.7)
Eosinophil %: 1.6 %
HCT: 38.3 % (ref 35.0–47.0)
HGB: 12.8 g/dL (ref 12.0–16.0)
Lymphocyte #: 1.1 x10 3/mm (ref 1.0–3.6)
MCHC: 33.3 g/dL (ref 32.0–36.0)
MCV: 93.9 fL (ref 80–100)
Monocyte #: 0.3 x10 3/mm (ref 0.0–0.7)
Neutrophil #: 3.9 x10 3/mm (ref 1.4–6.5)
Platelet: 255 x10 3/mm (ref 150–440)
RDW: 12.9 % (ref 11.5–14.5)
WBC: 5.4 x10 3/mm (ref 3.6–11.0)

## 2011-10-12 LAB — CBC CANCER CENTER
Basophil %: 0.1 %
Eosinophil #: 0.1 x10 3/mm (ref 0.0–0.7)
Eosinophil %: 0.6 %
HCT: 33.9 % — ABNORMAL LOW (ref 35.0–47.0)
HGB: 11.5 g/dL — ABNORMAL LOW (ref 12.0–16.0)
Lymphocyte %: 11 %
MCH: 31.7 pg (ref 26.0–34.0)
MCHC: 34 g/dL (ref 32.0–36.0)
Monocyte %: 3.2 %
Neutrophil #: 15.6 x10 3/mm — ABNORMAL HIGH (ref 1.4–6.5)
Neutrophil %: 85.1 %
Platelet: 226 x10 3/mm (ref 150–440)
RBC: 3.64 10*6/uL — ABNORMAL LOW (ref 3.80–5.20)
WBC: 18.4 x10 3/mm — ABNORMAL HIGH (ref 3.6–11.0)

## 2011-10-12 LAB — COMPREHENSIVE METABOLIC PANEL
Albumin: 3.4 g/dL (ref 3.4–5.0)
Alkaline Phosphatase: 114 U/L (ref 50–136)
Anion Gap: 9 (ref 7–16)
BUN: 6 mg/dL — ABNORMAL LOW (ref 7–18)
Calcium, Total: 8.3 mg/dL — ABNORMAL LOW (ref 8.5–10.1)
EGFR (Non-African Amer.): >60
Glucose: 97 mg/dL (ref 65–99)
Osmolality: 279 (ref 275–301)
Potassium: 4 mmol/L (ref 3.5–5.1)
SGPT (ALT): 35 U/L
Sodium: 141 mmol/L (ref 136–145)
Total Protein: 6.5 g/dL (ref 6.4–8.2)

## 2011-10-19 LAB — CBC CANCER CENTER
Basophil #: 0 x10 3/mm (ref 0.0–0.1)
Basophil %: 0.7 %
Eosinophil #: 0.1 x10 3/mm (ref 0.0–0.7)
HCT: 34.2 % — ABNORMAL LOW (ref 35.0–47.0)
HGB: 11.2 g/dL — ABNORMAL LOW (ref 12.0–16.0)
Lymphocyte #: 0.8 x10 3/mm — ABNORMAL LOW (ref 1.0–3.6)
MCH: 30.8 pg (ref 26.0–34.0)
MCHC: 32.9 g/dL (ref 32.0–36.0)
MCV: 93.5 fL (ref 80–100)
Monocyte %: 4.2 %
Neutrophil #: 2.6 x10 3/mm (ref 1.4–6.5)
Neutrophil %: 69.5 %
Platelet: 240 x10 3/mm (ref 150–440)
WBC: 3.7 x10 3/mm (ref 3.6–11.0)

## 2011-10-26 LAB — CBC CANCER CENTER
Basophil #: 0 x10 3/mm (ref 0.0–0.1)
Eosinophil #: 0.1 x10 3/mm (ref 0.0–0.7)
Eosinophil %: 0.6 %
HCT: 33.1 % — ABNORMAL LOW (ref 35.0–47.0)
HGB: 11.3 g/dL — ABNORMAL LOW (ref 12.0–16.0)
Lymphocyte %: 11.3 %
MCHC: 34 g/dL (ref 32.0–36.0)
Neutrophil #: 11.6 x10 3/mm — ABNORMAL HIGH (ref 1.4–6.5)
Neutrophil %: 83.1 %
RBC: 3.53 10*6/uL — ABNORMAL LOW (ref 3.80–5.20)
RDW: 14.7 % — ABNORMAL HIGH (ref 11.5–14.5)

## 2011-10-26 LAB — COMPREHENSIVE METABOLIC PANEL
Albumin: 3.7 g/dL (ref 3.4–5.0)
Alkaline Phosphatase: 130 U/L (ref 50–136)
BUN: 8 mg/dL (ref 7–18)
Bilirubin,Total: 0.2 mg/dL (ref 0.2–1.0)
Calcium, Total: 8.8 mg/dL (ref 8.5–10.1)
Chloride: 104 mmol/L (ref 98–107)
Co2: 29 mmol/L (ref 21–32)
Creatinine: 0.69 mg/dL (ref 0.60–1.30)
EGFR (African American): >60
Glucose: 86 mg/dL (ref 65–99)
Osmolality: 277 (ref 275–301)
SGOT(AST): 15 U/L (ref 15–37)
SGPT (ALT): 36 U/L
Sodium: 140 mmol/L (ref 136–145)

## 2011-11-02 ENCOUNTER — Ambulatory Visit: Payer: Self-pay | Admitting: Oncology

## 2011-11-02 LAB — CBC CANCER CENTER
Basophil %: 0.7 %
Eosinophil %: 3 %
HCT: 31.7 % — ABNORMAL LOW (ref 35.0–47.0)
HGB: 10.6 g/dL — ABNORMAL LOW (ref 12.0–16.0)
Lymphocyte #: 0.6 x10 3/mm — ABNORMAL LOW (ref 1.0–3.6)
MCH: 31.8 pg (ref 26.0–34.0)
MCHC: 33.3 g/dL (ref 32.0–36.0)
MCV: 96 fL (ref 80–100)
Monocyte #: 0.7 x10 3/mm (ref 0.0–0.7)
Monocyte %: 10.7 %
Neutrophil #: 4.9 x10 3/mm (ref 1.4–6.5)
Neutrophil %: 75.9 %
Platelet: 257 x10 3/mm (ref 150–440)
RBC: 3.32 10*6/uL — ABNORMAL LOW (ref 3.80–5.20)
RDW: 18.6 % — ABNORMAL HIGH (ref 11.5–14.5)
WBC: 6.4 x10 3/mm (ref 3.6–11.0)

## 2011-11-02 LAB — CREATININE, SERUM: EGFR (African American): >60

## 2011-11-09 LAB — CBC CANCER CENTER
Basophil #: 0 x10 3/mm (ref 0.0–0.1)
Basophil %: 0.4 %
Eosinophil #: 0.4 x10 3/mm (ref 0.0–0.7)
HGB: 11.1 g/dL — ABNORMAL LOW (ref 12.0–16.0)
Lymphocyte #: 0.9 x10 3/mm — ABNORMAL LOW (ref 1.0–3.6)
MCH: 32.3 pg (ref 26.0–34.0)
MCHC: 33.9 g/dL (ref 32.0–36.0)
MCV: 95.3 fL (ref 80–100)
Monocyte #: 0.1 x10 3/mm (ref 0.0–0.7)
Neutrophil #: 5.1 x10 3/mm (ref 1.4–6.5)
WBC: 6.5 x10 3/mm (ref 3.6–11.0)

## 2011-11-13 ENCOUNTER — Other Ambulatory Visit: Payer: Self-pay | Admitting: *Deleted

## 2011-11-13 MED ORDER — IBUPROFEN 800 MG PO TABS: 800.0000 mg | ORAL_TABLET | Freq: Three times a day (TID) | ORAL | Status: DC

## 2011-11-16 LAB — CBC CANCER CENTER
Basophil #: 0.1 x10 3/mm (ref 0.0–0.1)
Basophil %: 0.6 %
Eosinophil #: 0 x10 3/mm (ref 0.0–0.7)
HGB: 11.9 g/dL — ABNORMAL LOW (ref 12.0–16.0)
Lymphocyte %: 4.4 %
MCH: 31.6 pg (ref 26.0–34.0)
MCHC: 32.9 g/dL (ref 32.0–36.0)
MCV: 96 fL (ref 80–100)
Monocyte #: 0.2 x10 3/mm (ref 0.2–0.9)
Neutrophil %: 93.6 %
Platelet: 269 x10 3/mm (ref 150–440)
RDW: 18.8 % — ABNORMAL HIGH (ref 11.5–14.5)
WBC: 15.1 x10 3/mm — ABNORMAL HIGH (ref 3.6–11.0)

## 2011-11-16 LAB — COMPREHENSIVE METABOLIC PANEL
Alkaline Phosphatase: 125 U/L (ref 50–136)
BUN: 10 mg/dL (ref 7–18)
Calcium, Total: 9.3 mg/dL (ref 8.5–10.1)
Chloride: 100 mmol/L (ref 98–107)
Co2: 28 mmol/L (ref 21–32)
EGFR (Non-African Amer.): >60
Osmolality: 284 (ref 275–301)
Potassium: 3.6 mmol/L (ref 3.5–5.1)
SGOT(AST): 18 U/L (ref 15–37)
SGPT (ALT): 26 U/L

## 2011-11-18 ENCOUNTER — Other Ambulatory Visit: Payer: Self-pay

## 2011-11-18 MED ORDER — CYCLOBENZAPRINE HCL 10 MG PO TABS: 10.0000 mg | ORAL_TABLET | Freq: Two times a day (BID) | ORAL | Status: DC | PRN

## 2011-11-23 LAB — CBC CANCER CENTER
Basophil #: 0.1 x10 3/mm (ref 0.0–0.1)
Basophil %: 1.2 %
Eosinophil #: 0.1 x10 3/mm (ref 0.0–0.7)
Eosinophil %: 1.2 %
Lymphocyte #: 1 x10 3/mm (ref 1.0–3.6)
MCV: 96 fL (ref 80–100)
Monocyte #: 0.5 x10 3/mm (ref 0.2–0.9)
Monocyte %: 8.5 %
Neutrophil #: 4.8 x10 3/mm (ref 1.4–6.5)
RDW: 18.5 % — ABNORMAL HIGH (ref 11.5–14.5)

## 2011-11-30 LAB — CBC CANCER CENTER
Basophil #: 0.1 x10 3/mm (ref 0.0–0.1)
Basophil %: 1 %
Eosinophil %: 3.5 %
Lymphocyte #: 1 x10 3/mm (ref 1.0–3.6)
MCH: 32.2 pg (ref 26.0–34.0)
MCV: 97 fL (ref 80–100)
Monocyte #: 0.4 x10 3/mm (ref 0.2–0.9)
Monocyte %: 7.3 %
Neutrophil #: 4.3 x10 3/mm (ref 1.4–6.5)
Platelet: 272 x10 3/mm (ref 150–440)
RBC: 3.4 10*6/uL — ABNORMAL LOW (ref 3.80–5.20)
WBC: 6 x10 3/mm (ref 3.6–11.0)

## 2011-12-02 ENCOUNTER — Ambulatory Visit: Payer: Self-pay | Admitting: Oncology

## 2011-12-07 LAB — CBC CANCER CENTER
HGB: 11.2 g/dL — ABNORMAL LOW (ref 12.0–16.0)
MCH: 32.4 pg (ref 26.0–34.0)
Monocyte #: 0.4 x10 3/mm (ref 0.2–0.9)
Neutrophil #: 4.8 x10 3/mm (ref 1.4–6.5)
Neutrophil %: 73 %
Platelet: 284 x10 3/mm (ref 150–440)
RBC: 3.44 10*6/uL — ABNORMAL LOW (ref 3.80–5.20)
RDW: 18.6 % — ABNORMAL HIGH (ref 11.5–14.5)
WBC: 6.5 x10 3/mm (ref 3.6–11.0)

## 2011-12-07 LAB — COMPREHENSIVE METABOLIC PANEL
Albumin: 3.7 g/dL (ref 3.4–5.0)
Alkaline Phosphatase: 76 U/L (ref 50–136)
Anion Gap: 7 (ref 7–16)
BUN: 9 mg/dL (ref 7–18)
Bilirubin,Total: 0.2 mg/dL (ref 0.2–1.0)
Co2: 26 mmol/L (ref 21–32)
Creatinine: 0.63 mg/dL (ref 0.60–1.30)
EGFR (African American): >60
EGFR (Non-African Amer.): >60
Glucose: 104 mg/dL — ABNORMAL HIGH (ref 65–99)
Osmolality: 277 (ref 275–301)
Potassium: 4 mmol/L (ref 3.5–5.1)
SGOT(AST): 21 U/L (ref 15–37)
SGPT (ALT): 33 U/L
Sodium: 139 mmol/L (ref 136–145)
Total Protein: 6.8 g/dL (ref 6.4–8.2)

## 2011-12-14 LAB — CBC CANCER CENTER
Basophil %: 0.7 %
Eosinophil %: 4.1 %
MCH: 32.8 pg (ref 26.0–34.0)
MCHC: 33.6 g/dL (ref 32.0–36.0)
MCV: 98 fL (ref 80–100)
Monocyte #: 0.5 x10 3/mm (ref 0.2–0.9)
Neutrophil #: 5.7 x10 3/mm (ref 1.4–6.5)
Neutrophil %: 73 %
Platelet: 259 x10 3/mm (ref 150–440)
RBC: 3.31 10*6/uL — ABNORMAL LOW (ref 3.80–5.20)
RDW: 17.1 % — ABNORMAL HIGH (ref 11.5–14.5)

## 2011-12-21 LAB — CBC CANCER CENTER
Basophil #: 0.1 x10 3/mm (ref 0.0–0.1)
Basophil %: 1.1 %
Eosinophil #: 0.3 x10 3/mm (ref 0.0–0.7)
Eosinophil %: 4 %
HGB: 11.6 g/dL — ABNORMAL LOW (ref 12.0–16.0)
Lymphocyte #: 1.2 x10 3/mm (ref 1.0–3.6)
Lymphocyte %: 16.7 %
MCH: 32.5 pg (ref 26.0–34.0)
MCHC: 32.9 g/dL (ref 32.0–36.0)
MCV: 99 fL (ref 80–100)
Monocyte #: 0.4 x10 3/mm (ref 0.2–0.9)
Neutrophil %: 73 %
Platelet: 291 x10 3/mm (ref 150–440)
WBC: 6.9 x10 3/mm (ref 3.6–11.0)

## 2011-12-29 LAB — CBC CANCER CENTER
Eosinophil #: 0.3 x10 3/mm (ref 0.0–0.7)
HGB: 11.4 g/dL — ABNORMAL LOW (ref 12.0–16.0)
Lymphocyte #: 0.9 x10 3/mm — ABNORMAL LOW (ref 1.0–3.6)
Lymphocyte %: 10.6 %
MCH: 32.9 pg (ref 26.0–34.0)
MCHC: 33.2 g/dL (ref 32.0–36.0)
MCV: 99 fL (ref 80–100)
Monocyte #: 0.5 x10 3/mm (ref 0.2–0.9)
Monocyte %: 5.2 %
Neutrophil #: 6.9 x10 3/mm — ABNORMAL HIGH (ref 1.4–6.5)
Platelet: 302 x10 3/mm (ref 150–440)
RBC: 3.47 10*6/uL — ABNORMAL LOW (ref 3.80–5.20)

## 2011-12-29 LAB — COMPREHENSIVE METABOLIC PANEL
Albumin: 3.6 g/dL (ref 3.4–5.0)
Alkaline Phosphatase: 72 U/L (ref 50–136)
Anion Gap: 9 (ref 7–16)
Bilirubin,Total: 0.4 mg/dL (ref 0.2–1.0)
Calcium, Total: 8.9 mg/dL (ref 8.5–10.1)
Chloride: 106 mmol/L (ref 98–107)
Creatinine: 0.68 mg/dL (ref 0.60–1.30)
EGFR (Non-African Amer.): >60
Glucose: 104 mg/dL — ABNORMAL HIGH (ref 65–99)
Sodium: 141 mmol/L (ref 136–145)
Total Protein: 6.6 g/dL (ref 6.4–8.2)

## 2012-01-02 ENCOUNTER — Ambulatory Visit: Payer: Self-pay | Admitting: Oncology

## 2012-01-04 LAB — CBC CANCER CENTER
HCT: 33.9 % — ABNORMAL LOW (ref 35.0–47.0)
Lymphocyte #: 1.2 x10 3/mm (ref 1.0–3.6)
MCH: 33.3 pg (ref 26.0–34.0)
MCHC: 33.5 g/dL (ref 32.0–36.0)
MCV: 100 fL (ref 80–100)
Monocyte #: 0.2 x10 3/mm (ref 0.2–0.9)
Monocyte %: 4 %
Neutrophil #: 3.5 x10 3/mm (ref 1.4–6.5)
Neutrophil %: 67.8 %
Platelet: 274 x10 3/mm (ref 150–440)
RBC: 3.41 10*6/uL — ABNORMAL LOW (ref 3.80–5.20)
RDW: 15.3 % — ABNORMAL HIGH (ref 11.5–14.5)
WBC: 5.2 x10 3/mm (ref 3.6–11.0)

## 2012-01-11 LAB — CBC CANCER CENTER
Basophil #: 0.1 x10 3/mm (ref 0.0–0.1)
Eosinophil %: 4.1 %
HGB: 12.1 g/dL (ref 12.0–16.0)
Lymphocyte %: 19.4 %
MCH: 32.8 pg (ref 26.0–34.0)
MCHC: 33 g/dL (ref 32.0–36.0)
MCV: 100 fL (ref 80–100)
Platelet: 283 x10 3/mm (ref 150–440)
RBC: 3.69 10*6/uL — ABNORMAL LOW (ref 3.80–5.20)
WBC: 5.9 x10 3/mm (ref 3.6–11.0)

## 2012-01-18 LAB — COMPREHENSIVE METABOLIC PANEL
Albumin: 3.6 g/dL (ref 3.4–5.0)
Anion Gap: 7 (ref 7–16)
Chloride: 107 mmol/L (ref 98–107)
Co2: 28 mmol/L (ref 21–32)
EGFR (African American): >60
EGFR (Non-African Amer.): >60
Osmolality: 281 (ref 275–301)
Potassium: 4 mmol/L (ref 3.5–5.1)
Sodium: 142 mmol/L (ref 136–145)

## 2012-01-18 LAB — CBC CANCER CENTER
Eosinophil %: 3.5 %
Lymphocyte %: 21.7 %
MCHC: 33.2 g/dL (ref 32.0–36.0)
MCV: 100 fL (ref 80–100)
Monocyte %: 7 %
Neutrophil #: 3.1 x10 3/mm (ref 1.4–6.5)
Neutrophil %: 67 %
RDW: 14.9 % — ABNORMAL HIGH (ref 11.5–14.5)
WBC: 4.6 x10 3/mm (ref 3.6–11.0)

## 2012-01-25 LAB — CBC CANCER CENTER
Basophil #: 0.1 x10 3/mm (ref 0.0–0.1)
Basophil %: 1.1 %
Eosinophil #: 0.2 x10 3/mm (ref 0.0–0.7)
Eosinophil %: 4.6 %
HCT: 36.7 % (ref 35.0–47.0)
Lymphocyte #: 1 x10 3/mm (ref 1.0–3.6)
MCH: 33.1 pg (ref 26.0–34.0)
Monocyte %: 6.9 %
Neutrophil #: 3.2 x10 3/mm (ref 1.4–6.5)
Neutrophil %: 67.3 %
Platelet: 261 x10 3/mm (ref 150–440)
RBC: 3.66 10*6/uL — ABNORMAL LOW (ref 3.80–5.20)
WBC: 4.8 x10 3/mm (ref 3.6–11.0)

## 2012-02-01 ENCOUNTER — Ambulatory Visit: Payer: Self-pay | Admitting: Oncology

## 2012-02-01 LAB — CBC CANCER CENTER
Basophil #: 0 x10 3/mm (ref 0.0–0.1)
Basophil %: 0.9 %
Eosinophil %: 4.6 %
HGB: 12.4 g/dL (ref 12.0–16.0)
Monocyte #: 0.3 x10 3/mm (ref 0.2–0.9)
Monocyte %: 6.6 %
Neutrophil %: 64.3 %
Platelet: 251 x10 3/mm (ref 150–440)
RBC: 3.79 10*6/uL — ABNORMAL LOW (ref 3.80–5.20)
RDW: 14.6 % — ABNORMAL HIGH (ref 11.5–14.5)
WBC: 4.9 x10 3/mm (ref 3.6–11.0)

## 2012-02-01 LAB — COMPREHENSIVE METABOLIC PANEL
Alkaline Phosphatase: 79 U/L (ref 50–136)
Calcium, Total: 9.1 mg/dL (ref 8.5–10.1)
Co2: 26 mmol/L (ref 21–32)
Creatinine: 0.64 mg/dL (ref 0.60–1.30)
EGFR (African American): >60
EGFR (Non-African Amer.): >60
Glucose: 106 mg/dL — ABNORMAL HIGH (ref 65–99)
SGOT(AST): 19 U/L (ref 15–37)
SGPT (ALT): 29 U/L

## 2012-02-22 LAB — CBC CANCER CENTER
Basophil %: 1 %
Eosinophil #: 0.5 x10 3/mm (ref 0.0–0.7)
Eosinophil %: 6.5 %
HCT: 38.1 % (ref 35.0–47.0)
Lymphocyte %: 25.8 %
MCHC: 34.5 g/dL (ref 32.0–36.0)
Monocyte #: 0.5 x10 3/mm (ref 0.2–0.9)
Monocyte %: 6.1 %
Neutrophil #: 4.6 x10 3/mm (ref 1.4–6.5)
Neutrophil %: 60.6 %
WBC: 7.6 x10 3/mm (ref 3.6–11.0)

## 2012-02-22 LAB — COMPREHENSIVE METABOLIC PANEL
Albumin: 3.8 g/dL (ref 3.4–5.0)
Alkaline Phosphatase: 88 U/L (ref 50–136)
Anion Gap: 9 (ref 7–16)
BUN: 10 mg/dL (ref 7–18)
Bilirubin,Total: 0.2 mg/dL (ref 0.2–1.0)
Calcium, Total: 9.2 mg/dL (ref 8.5–10.1)
Chloride: 103 mmol/L (ref 98–107)
Creatinine: 0.83 mg/dL (ref 0.60–1.30)
EGFR (African American): >60
Glucose: 98 mg/dL (ref 65–99)
Potassium: 4.2 mmol/L (ref 3.5–5.1)
SGPT (ALT): 27 U/L
Sodium: 141 mmol/L (ref 136–145)
Total Protein: 6.8 g/dL (ref 6.4–8.2)

## 2012-03-03 ENCOUNTER — Ambulatory Visit: Payer: Self-pay | Admitting: Oncology

## 2012-03-22 LAB — CBC CANCER CENTER
Basophil #: 0.1 x10 3/mm (ref 0.0–0.1)
Eosinophil #: 0.9 x10 3/mm — ABNORMAL HIGH (ref 0.0–0.7)
Eosinophil %: 11.5 %
HGB: 13.2 g/dL (ref 12.0–16.0)
Lymphocyte %: 22.9 %
MCH: 32.3 pg (ref 26.0–34.0)
MCHC: 34.3 g/dL (ref 32.0–36.0)
Monocyte #: 0.5 x10 3/mm (ref 0.2–0.9)
Neutrophil %: 57 %
Platelet: 224 x10 3/mm (ref 150–440)
RBC: 4.1 10*6/uL (ref 3.80–5.20)

## 2012-03-30 LAB — CBC CANCER CENTER
Basophil %: 1 %
Eosinophil #: 0.8 x10 3/mm — ABNORMAL HIGH (ref 0.0–0.7)
HGB: 13 g/dL (ref 12.0–16.0)
Monocyte #: 0.5 x10 3/mm (ref 0.2–0.9)
Neutrophil #: 3.5 x10 3/mm (ref 1.4–6.5)
RBC: 3.95 10*6/uL (ref 3.80–5.20)
WBC: 6.5 x10 3/mm (ref 3.6–11.0)

## 2012-03-31 ENCOUNTER — Other Ambulatory Visit: Payer: Self-pay | Admitting: Physical Medicine & Rehabilitation

## 2012-04-03 ENCOUNTER — Ambulatory Visit: Payer: Self-pay | Admitting: Oncology

## 2012-04-05 LAB — CBC CANCER CENTER
Basophil #: 0 x10 3/mm (ref 0.0–0.1)
Basophil %: 0.2 %
Eosinophil #: 1 x10 3/mm — ABNORMAL HIGH (ref 0.0–0.7)
Lymphocyte %: 20.7 %
MCH: 32 pg (ref 26.0–34.0)
MCHC: 34.2 g/dL (ref 32.0–36.0)
Monocyte #: 0.5 x10 3/mm (ref 0.2–0.9)
Monocyte %: 6.4 %
Neutrophil %: 60 %
Platelet: 207 x10 3/mm (ref 150–440)
RDW: 14.3 % (ref 11.5–14.5)

## 2012-04-12 ENCOUNTER — Encounter: Payer: BC Managed Care – PPO | Attending: Physical Medicine & Rehabilitation

## 2012-04-12 ENCOUNTER — Ambulatory Visit (HOSPITAL_BASED_OUTPATIENT_CLINIC_OR_DEPARTMENT_OTHER): Payer: Self-pay | Admitting: Physical Medicine & Rehabilitation

## 2012-04-12 ENCOUNTER — Other Ambulatory Visit: Payer: Self-pay | Admitting: Physical Medicine and Rehabilitation

## 2012-04-12 ENCOUNTER — Encounter: Payer: Self-pay | Admitting: Physical Medicine & Rehabilitation

## 2012-04-12 VITALS — BP 104/75 | HR 83 | Resp 16 | Ht 63.0 in | Wt 160.0 lb

## 2012-04-12 DIAGNOSIS — M961 Postlaminectomy syndrome, not elsewhere classified: Secondary | ICD-10-CM | POA: Insufficient documentation

## 2012-04-12 DIAGNOSIS — T50904A Poisoning by unspecified drugs, medicaments and biological substances, undetermined, initial encounter: Secondary | ICD-10-CM

## 2012-04-12 DIAGNOSIS — IMO0002 Reserved for concepts with insufficient information to code with codable children: Secondary | ICD-10-CM | POA: Insufficient documentation

## 2012-04-12 DIAGNOSIS — G620 Drug-induced polyneuropathy: Secondary | ICD-10-CM | POA: Insufficient documentation

## 2012-04-12 DIAGNOSIS — T451X5A Adverse effect of antineoplastic and immunosuppressive drugs, initial encounter: Secondary | ICD-10-CM

## 2012-04-12 LAB — CBC CANCER CENTER
Basophil %: 1.2 %
Eosinophil #: 1.2 x10 3/mm — ABNORMAL HIGH (ref 0.0–0.7)
Lymphocyte #: 2 x10 3/mm (ref 1.0–3.6)
MCH: 32.6 pg (ref 26.0–34.0)
MCHC: 34.8 g/dL (ref 32.0–36.0)
MCV: 94 fL (ref 80–100)
Monocyte #: 0.5 x10 3/mm (ref 0.2–0.9)
Monocyte %: 6.1 %
Neutrophil %: 56.2 %
Platelet: 222 x10 3/mm (ref 150–440)
RBC: 4.22 10*6/uL (ref 3.80–5.20)

## 2012-04-12 MED ORDER — GABAPENTIN 400 MG PO CAPS: 400.0000 mg | ORAL_CAPSULE | Freq: Three times a day (TID) | ORAL | Status: DC

## 2012-04-12 MED ORDER — HYDROCODONE-ACETAMINOPHEN 10-325 MG PO TABS: 1.0000 | ORAL_TABLET | Freq: Four times a day (QID) | ORAL | Status: DC | PRN

## 2012-04-12 NOTE — Patient Instructions (Addendum)
Her gabapentin dose will increase to 400 mg 3 times per day Hydrocodone 10 mg 4 times per day Return to clinic in one month for reassessment Walk 10 minutes 3 times a day on the weekends

## 2012-04-12 NOTE — Progress Notes (Signed)
Subjective:    Patient ID: Elizabeth Torres, female    DOB: 12/27/1975, 36 y.o.   MRN: [id number]  HPI This is a 36 year old female with greater  than 2-year history of lumbar pain and lower extremity pain. She  underwent conservative care including physical therapy and medications  including narcotic analgesics, nonnarcotic, analgesics, NSAIDS, and  Neurontin prior to undergoing a left L5-S1 laminectomy and diskectomy  for herniated nucleus pulposus on Dec 13, 2009. She had a recurrent  disk at that same level and underwent an L5-S1 fusion on January 07, 2010.  Interval medical history positive for diagnosis of right breast  carcinoma. She has undergone lumpectomy as well as a partial mastectomy  on July 26, 2011. She is scheduled for 16 chemotherapy treatments  over the next 22 weeks, followed by radiation therapy 5 days per week  for 7 wks.  Now completing this   Has been working all along.  Pain Inventory Average Pain 6 Pain Right Now 8 My pain is constant and aching  In the last 24 hours, has pain interfered with the following? General activity 7 Relation with others 7 Enjoyment of life 7 What TIME of day is your pain at its worst? daytime Sleep (in general) Fair  Pain is worse with: walking and standing Pain improves with: heat/ice and medication Relief from Meds: 6  Mobility walk without assistance how many minutes can you walk? 10 ability to climb steps?  yes do you drive?  yes  Function employed # of hrs/week 45 I need assistance with the following:  meal prep and household duties  Neuro/Psych numbness  Prior Studies x-rays Chemo/Radiation  Physicians involved in your care Any changes since last visit?  no   Family History  Problem Relation Age of Onset  . Diabetes Mother   . Hypertension Mother   . Cancer Paternal Uncle    History   Social History  . Marital Status: Married    Spouse Name: N/A    Number of Children: N/A  . Years of  Education: N/A   Social History Main Topics  . Smoking status: Current Everyday Smoker  . Smokeless tobacco: None  . Alcohol Use: None  . Drug Use: None  . Sexually Active: None   Other Topics Concern  . None   Social History Narrative  . None   Past Surgical History  Procedure Date  . Breast surgery   . Spine surgery    Past Medical History  Diagnosis Date  . Cancer    BP 104/75  Pulse 83  Resp 16  Ht 5\' 3"  (1.6 m)  Wt 160 lb (72.576 kg)  BMI 28.34 kg/m2  SpO2 99%      Review of Systems  Constitutional: Negative.   HENT: Negative.   Eyes: Negative.   Respiratory: Negative.   Cardiovascular: Negative.   Gastrointestinal: Negative.   Genitourinary: Negative.   Musculoskeletal: Positive for myalgias, back pain and arthralgias.  Skin: Negative.   Neurological: Negative.   Hematological: Negative.   Psychiatric/Behavioral: Negative.        Objective:   Physical Exam  Constitutional: She is oriented to person, place, and time. She appears well-developed and well-nourished.  HENT:       alopecia  Eyes: Conjunctivae are normal. Pupils are equal, round, and reactive to light.  Musculoskeletal:       Lumbar back: She exhibits decreased range of motion. She exhibits no tenderness and no deformity.  Negative straight leg raising test  Neurological: She is alert and oriented to person, place, and time. She has normal strength. She displays no atrophy. A sensory deficit is present. Gait normal.       Sensory deficit in the feet hyperalgesia to pinprick below the knees  Psychiatric: She has a normal mood and affect.          Assessment & Plan:  1. 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 Recommend increase Neurontin to 400 mg 3 times per day Continue hydrocodone 10/325 4 times a day Will need urine drug screen today I'll see her back in one month  will reassess medications. Discussed walking program 10 minutes 3 times a day on weekends.

## 2012-04-19 LAB — CBC CANCER CENTER
Basophil %: 0.8 %
Eosinophil %: 14.8 %
Lymphocyte %: 22.3 %
MCH: 32.7 pg (ref 26.0–34.0)
Monocyte #: 0.5 x10 3/mm (ref 0.2–0.9)
Monocyte %: 6.5 %
Neutrophil %: 55.6 %
RBC: 4.1 10*6/uL (ref 3.80–5.20)
WBC: 7.6 x10 3/mm (ref 3.6–11.0)

## 2012-04-26 LAB — CBC CANCER CENTER
Basophil %: 1.3 %
Eosinophil #: 1.3 x10 3/mm — ABNORMAL HIGH (ref 0.0–0.7)
Eosinophil %: 18.5 %
HCT: 38.8 % (ref 35.0–47.0)
HGB: 13.2 g/dL (ref 12.0–16.0)
MCH: 32 pg (ref 26.0–34.0)
MCHC: 34.1 g/dL (ref 32.0–36.0)
Monocyte #: 0.6 x10 3/mm (ref 0.2–0.9)
Neutrophil %: 46.7 %
RDW: 14.2 % (ref 11.5–14.5)
WBC: 7.2 x10 3/mm (ref 3.6–11.0)

## 2012-05-03 ENCOUNTER — Ambulatory Visit: Payer: Self-pay | Admitting: Oncology

## 2012-05-09 ENCOUNTER — Encounter: Payer: BC Managed Care – PPO | Attending: Physical Medicine & Rehabilitation

## 2012-05-09 ENCOUNTER — Ambulatory Visit: Payer: BC Managed Care – PPO | Admitting: Physical Medicine & Rehabilitation

## 2012-05-09 DIAGNOSIS — IMO0002 Reserved for concepts with insufficient information to code with codable children: Secondary | ICD-10-CM | POA: Insufficient documentation

## 2012-05-09 DIAGNOSIS — M961 Postlaminectomy syndrome, not elsewhere classified: Secondary | ICD-10-CM | POA: Insufficient documentation

## 2012-05-11 ENCOUNTER — Telehealth: Payer: Self-pay | Admitting: Physical Medicine & Rehabilitation

## 2012-05-11 MED ORDER — HYDROCODONE-ACETAMINOPHEN 10-325 MG PO TABS: 1.0000 | ORAL_TABLET | Freq: Four times a day (QID) | ORAL | Status: DC | PRN

## 2012-05-11 NOTE — Telephone Encounter (Signed)
Rx has been called in, pt aware. 

## 2012-05-11 NOTE — Telephone Encounter (Signed)
Please call in Hydrocodone to [facility]

## 2012-05-16 ENCOUNTER — Ambulatory Visit (HOSPITAL_BASED_OUTPATIENT_CLINIC_OR_DEPARTMENT_OTHER): Payer: BC Managed Care – PPO | Admitting: Physical Medicine & Rehabilitation

## 2012-05-16 ENCOUNTER — Encounter: Payer: Self-pay | Admitting: Physical Medicine & Rehabilitation

## 2012-05-16 VITALS — BP 112/69 | HR 77 | Resp 16 | Ht 63.0 in | Wt 163.0 lb

## 2012-05-16 DIAGNOSIS — M961 Postlaminectomy syndrome, not elsewhere classified: Secondary | ICD-10-CM

## 2012-05-16 DIAGNOSIS — G620 Drug-induced polyneuropathy: Secondary | ICD-10-CM

## 2012-05-16 DIAGNOSIS — T451X5A Adverse effect of antineoplastic and immunosuppressive drugs, initial encounter: Secondary | ICD-10-CM

## 2012-05-16 MED ORDER — GABAPENTIN 300 MG PO CAPS: 600.0000 mg | ORAL_CAPSULE | Freq: Three times a day (TID) | ORAL | Status: DC

## 2012-05-16 NOTE — Progress Notes (Signed)
Subjective:    Patient ID: Elizabeth Torres, female    DOB: 1975-11-19, 36 y.o.   MRN: [id number] This is a 36 year old female with greater  than 2-year history of lumbar pain and lower extremity pain. She  underwent conservative care including physical therapy and medications  including narcotic analgesics, nonnarcotic, analgesics, NSAIDS, and  Neurontin prior to undergoing a left L5-S1 laminectomy and diskectomy  for herniated nucleus pulposus on Dec 13, 2009. She had a recurrent  disk at that same level and underwent an L5-S1 fusion on January 07, 2010.  Interval medical history positive for diagnosis of right breast  carcinoma. She has undergone lumpectomy as well as a partial mastectomy  on July 26, 2011. She is scheduled for 16 chemotherapy treatments  over the next 22 weeks, followed by radiation therapy 5 days per week  for 7 wks. Now completing this  Has been working all along.  HPI No significant improvement on Neurontin 400 3 times per day. She is taking this for her neuropathy pain which started after her chemotherapy treatments. She has chronic back pain and postlaminectomy syndrome and has been on Norco for this 10 mg 4 times per day. She finds she is sometimes taking an extra half tablet. Pain Inventory Average Pain 5 Pain Right Now 6 My pain is aching  In the last 24 hours, has pain interfered with the following? General activity 4 Relation with others 4 Enjoyment of life 4 What TIME of day is your pain at its worst? daytime Sleep (in general) Fair  Pain is worse with: walking, bending, sitting and standing Pain improves with: heat/ice and medication Relief from Meds: 5  Mobility walk without assistance ability to climb steps?  yes do you drive?  yes  Function employed # of hrs/week 40 I need assistance with the following:  meal prep and household duties  Neuro/Psych trouble walking  Prior Studies Any changes since last visit?  no  Physicians involved  in your care Any changes since last visit?  no   Family History  Problem Relation Age of Onset  . Diabetes Mother   . Hypertension Mother   . Cancer Paternal Uncle    History   Social History  . Marital Status: Married    Spouse Name: N/A    Number of Children: N/A  . Years of Education: N/A   Social History Main Topics  . Smoking status: Current Every Day Smoker  . Smokeless tobacco: None  . Alcohol Use: None  . Drug Use: None  . Sexually Active: None   Other Topics Concern  . None   Social History Narrative  . None   Past Surgical History  Procedure Date  . Breast surgery   . Spine surgery    Past Medical History  Diagnosis Date  . Cancer    BP 112/69  Pulse 77  Resp 16  Ht 5\' 3"  (1.6 m)  Wt 163 lb (73.936 kg)  BMI 28.87 kg/m2  SpO2 97%      Review of Systems  Constitutional: Negative.   HENT: Negative.   Eyes: Negative.   Respiratory: Negative.   Cardiovascular: Negative.   Gastrointestinal: Negative.   Genitourinary: Negative.   Musculoskeletal: Positive for myalgias, back pain and arthralgias.  Skin: Negative.   Neurological: Negative.   Hematological: Negative.   Psychiatric/Behavioral: Negative.        Objective:   Physical Exam Constitutional: She is oriented to person, place, and time. She appears well-developed and well-nourished.  HENT:  alopecia  Eyes: Conjunctivae are normal. Pupils are equal, round, and reactive to light.  Musculoskeletal:  Lumbar back: She exhibits decreased range of motion. She exhibits no tenderness and no deformity.  Negative straight leg raising test  Neurological: She is alert and oriented to person, place, and time. She has normal strength. She displays no atrophy. A sensory deficit is present. Gait normal.  Sensory deficit in the feet hyperalgesia to pinprick below the knees  Psychiatric: She has a normal mood and affect.         Assessment & Plan:  1. 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  Recommend increase Neurontin to 600 mg 3 times per day  Continue hydrocodone 10/325 4 times a day ,urine drug screen Last visit was appropriate I'll see her back in one month will reassess medications

## 2012-05-16 NOTE — Patient Instructions (Addendum)
We are increasing the gabapentin dose. This is for the nerve pain in your feet. Your foot pain is mainly chemotherapy related. The hydrocodone is mainly for your back pain. We may need to change that medication but will discuss further next month.

## 2012-05-23 ENCOUNTER — Telehealth: Payer: Self-pay | Admitting: Physical Medicine & Rehabilitation

## 2012-05-23 MED ORDER — ZOLPIDEM TARTRATE 5 MG PO TABS: 5.0000 mg | ORAL_TABLET | Freq: Every evening | ORAL | Status: DC | PRN

## 2012-05-23 NOTE — Telephone Encounter (Signed)
Requesting ambien refill. She said that you released her to her oncologist and he prescribed it, but now he has released her back to you. Do you want to ok a refill?

## 2012-05-23 NOTE — Telephone Encounter (Signed)
Ok for [employer] 5 mg 1/2-1 tab qhs prn #30 0 RF

## 2012-05-23 NOTE — Telephone Encounter (Signed)
Refill on Ambien [facility]

## 2012-05-23 NOTE — Telephone Encounter (Signed)
[facility]  Patient aware.

## 2012-06-03 ENCOUNTER — Ambulatory Visit: Payer: Self-pay | Admitting: Oncology

## 2012-06-06 ENCOUNTER — Other Ambulatory Visit: Payer: Self-pay | Admitting: Physical Medicine & Rehabilitation

## 2012-06-20 ENCOUNTER — Ambulatory Visit (HOSPITAL_BASED_OUTPATIENT_CLINIC_OR_DEPARTMENT_OTHER): Payer: BC Managed Care – PPO | Admitting: Physical Medicine & Rehabilitation

## 2012-06-20 ENCOUNTER — Encounter: Payer: Self-pay | Admitting: Physical Medicine & Rehabilitation

## 2012-06-20 ENCOUNTER — Encounter: Payer: BC Managed Care – PPO | Attending: Physical Medicine & Rehabilitation

## 2012-06-20 VITALS — BP 118/60 | HR 92 | Resp 14 | Ht 63.0 in | Wt 160.0 lb

## 2012-06-20 DIAGNOSIS — M961 Postlaminectomy syndrome, not elsewhere classified: Secondary | ICD-10-CM

## 2012-06-20 DIAGNOSIS — IMO0002 Reserved for concepts with insufficient information to code with codable children: Secondary | ICD-10-CM | POA: Insufficient documentation

## 2012-06-20 DIAGNOSIS — G620 Drug-induced polyneuropathy: Secondary | ICD-10-CM

## 2012-06-20 DIAGNOSIS — T451X5A Adverse effect of antineoplastic and immunosuppressive drugs, initial encounter: Secondary | ICD-10-CM

## 2012-06-20 MED ORDER — GABAPENTIN 400 MG PO CAPS: 800.0000 mg | ORAL_CAPSULE | Freq: Three times a day (TID) | ORAL | Status: DC

## 2012-06-20 NOTE — Patient Instructions (Signed)
Plantar Fasciitis (Heel Spur Syndrome) with Rehab The plantar fascia is a fibrous, ligament-like, soft-tissue structure that spans the bottom of the foot. Plantar fasciitis is a condition that causes pain in the foot due to inflammation of the tissue. SYMPTOMS   Pain and tenderness on the underneath side of the foot.  Pain that worsens with standing or walking. CAUSES  Plantar fasciitis is caused by irritation and injury to the plantar fascia on the underneath side of the foot. Common mechanisms of injury include:  Direct trauma to bottom of the foot.  Damage to a small nerve that runs under the foot where the main fascia attaches to the heel bone.  Stress placed on the plantar fascia due to bone spurs. RISK INCREASES WITH:   Activities that place stress on the plantar fascia (running, jumping, pivoting, or cutting).  Poor strength and flexibility.  Improperly fitted shoes.  Tight calf muscles.  Flat feet.  Failure to warm-up properly before activity.  Obesity. PREVENTION  Warm up and stretch properly before activity.  Allow for adequate recovery between workouts.  Maintain physical fitness:  Strength, flexibility, and endurance.  Cardiovascular fitness.  Maintain a health body weight.  Avoid stress on the plantar fascia.  Wear properly fitted shoes, including arch supports for individuals who have flat feet. PROGNOSIS  If treated properly, then the symptoms of plantar fasciitis usually resolve without surgery. However, occasionally surgery is necessary. RELATED COMPLICATIONS   Recurrent symptoms that may result in a chronic condition.  Problems of the lower back that are caused by compensating for the injury, such as limping.  Pain or weakness of the foot during push-off following surgery.  Chronic inflammation, scarring, and partial or complete fascia tear, occurring more often from repeated injections. TREATMENT  Treatment initially involves the use of  ice and medication to help reduce pain and inflammation. The use of strengthening and stretching exercises may help reduce pain with activity, especially stretches of the Achilles tendon. These exercises may be performed at home or with a therapist. Your caregiver may recommend that you use heel cups of arch supports to help reduce stress on the plantar fascia. Occasionally, corticosteroid injections are given to reduce inflammation. If symptoms persist for greater than 6 months despite non-surgical (conservative), then surgery may be recommended.  MEDICATION   If pain medication is necessary, then nonsteroidal anti-inflammatory medications, such as aspirin and ibuprofen, or other minor pain relievers, such as acetaminophen, are often recommended.  Do not take pain medication within 7 days before surgery.  Prescription pain relievers may be given if deemed necessary by your caregiver. Use only as directed and only as much as you need.  Corticosteroid injections may be given by your caregiver. These injections should be reserved for the most serious cases, because they may only be given a certain number of times. HEAT AND COLD  Cold treatment (icing) relieves pain and reduces inflammation. Cold treatment should be applied for 10 to 15 minutes every 2 to 3 hours for inflammation and pain and immediately after any activity that aggravates your symptoms. Use ice packs or massage the area with a piece of ice (ice massage).  Heat treatment may be used prior to performing the stretching and strengthening activities prescribed by your caregiver, physical therapist, or athletic trainer. Use a heat pack or soak the injury in warm water. SEEK IMMEDIATE MEDICAL CARE IF:  Treatment seems to offer no benefit, or the condition worsens.  Any medications produce adverse side effects. EXERCISES RANGE   OF MOTION (ROM) AND STRETCHING EXERCISES - Plantar Fasciitis (Heel Spur Syndrome) These exercises may help you  when beginning to rehabilitate your injury. Your symptoms may resolve with or without further involvement from your physician, physical therapist or athletic trainer. While completing these exercises, remember:   Restoring tissue flexibility helps normal motion to return to the joints. This allows healthier, less painful movement and activity.  An effective stretch should be held for at least 30 seconds.  A stretch should never be painful. You should only feel a gentle lengthening or release in the stretched tissue. RANGE OF MOTION - Toe Extension, Flexion  Sit with your right / left leg crossed over your opposite knee.  Grasp your toes and gently pull them back toward the top of your foot. You should feel a stretch on the bottom of your toes and/or foot.  Hold this stretch for __________ seconds.  Now, gently pull your toes toward the bottom of your foot. You should feel a stretch on the top of your toes and or foot.  Hold this stretch for __________ seconds. Repeat __________ times. Complete this stretch __________ times per day.  RANGE OF MOTION - Ankle Dorsiflexion, Active Assisted  Remove shoes and sit on a chair that is preferably not on a carpeted surface.  Place right / left foot under knee. Extend your opposite leg for support.  Keeping your heel down, slide your right / left foot back toward the chair until you feel a stretch at your ankle or calf. If you do not feel a stretch, slide your bottom forward to the edge of the chair, while still keeping your heel down.  Hold this stretch for __________ seconds. Repeat __________ times. Complete this stretch __________ times per day.  STRETCH  Gastroc, Standing  Place hands on wall.  Extend right / left leg, keeping the front knee somewhat bent.  Slightly point your toes inward on your back foot.  Keeping your right / left heel on the floor and your knee straight, shift your weight toward the wall, not allowing your back to  arch.  You should feel a gentle stretch in the right / left calf. Hold this position for __________ seconds. Repeat __________ times. Complete this stretch __________ times per day. STRETCH  Soleus, Standing  Place hands on wall.  Extend right / left leg, keeping the other knee somewhat bent.  Slightly point your toes inward on your back foot.  Keep your right / left heel on the floor, bend your back knee, and slightly shift your weight over the back leg so that you feel a gentle stretch deep in your back calf.  Hold this position for __________ seconds. Repeat __________ times. Complete this stretch __________ times per day. STRETCH  Gastrocsoleus, Standing  Note: This exercise can place a lot of stress on your foot and ankle. Please complete this exercise only if specifically instructed by your caregiver.   Place the ball of your right / left foot on a step, keeping your other foot firmly on the same step.  Hold on to the wall or a rail for balance.  Slowly lift your other foot, allowing your body weight to press your heel down over the edge of the step.  You should feel a stretch in your right / left calf.  Hold this position for __________ seconds.  Repeat this exercise with a slight bend in your right / left knee. Repeat __________ times. Complete this stretch __________ times per day.    STRENGTHENING EXERCISES - Plantar Fasciitis (Heel Spur Syndrome)  These exercises may help you when beginning to rehabilitate your injury. They may resolve your symptoms with or without further involvement from your physician, physical therapist or athletic trainer. While completing these exercises, remember:   Muscles can gain both the endurance and the strength needed for everyday activities through controlled exercises.  Complete these exercises as instructed by your physician, physical therapist or athletic trainer. Progress the resistance and repetitions only as guided. STRENGTH - Towel  Curls  Sit in a chair positioned on a non-carpeted surface.  Place your foot on a towel, keeping your heel on the floor.  Pull the towel toward your heel by only curling your toes. Keep your heel on the floor.  If instructed by your physician, physical therapist or athletic trainer, add ____________________ at the end of the towel. Repeat __________ times. Complete this exercise __________ times per day. STRENGTH - Ankle Inversion  Secure one end of a rubber exercise band/tubing to a fixed object (table, pole). Loop the other end around your foot just before your toes.  Place your fists between your knees. This will focus your strengthening at your ankle.  Slowly, pull your big toe up and in, making sure the band/tubing is positioned to resist the entire motion.  Hold this position for __________ seconds.  Have your muscles resist the band/tubing as it slowly pulls your foot back to the starting position. Repeat __________ times. Complete this exercises __________ times per day.  Document Released: 07/20/2005 Document Revised: 10/12/2011 Document Reviewed: 11/01/2008 [facility] Patient Information 2013 [place], [place].  Also do ice massage with water bottle twice a day

## 2012-06-20 NOTE — Progress Notes (Signed)
Subjective:    Patient ID: Elizabeth Torres, female    DOB: 1976/03/26, 36 y.o.   MRN: [id number]  HPI This is a 36 year old female with greater  than 2-year history of lumbar pain and lower extremity pain. She  underwent conservative care including physical therapy and medications  including narcotic analgesics, nonnarcotic, analgesics, NSAIDS, and  Neurontin prior to undergoing a left L5-S1 laminectomy and diskectomy  for herniated nucleus pulposus on Dec 13, 2009. She had a recurrent  disk at that same level and underwent an L5-S1 fusion on January 07, 2010.  Interval medical history positive for diagnosis of right breast  carcinoma. She has undergone lumpectomy as well as a partial mastectomy  on July 26, 2011. She is scheduled for 16 chemotherapy treatments  over the next 22 weeks, followed by radiation therapy 5 days per week  for 7 wks. Completed radiation sept 25 2013 Onc f/u 330pm today Pain Inventory Average Pain 4 Pain Right Now 5 My pain is constant and aching  In the last 24 hours, has pain interfered with the following? General activity 4 Relation with others 3 Enjoyment of life 4 What TIME of day is your pain at its worst? varies Sleep (in general) Fair  Pain is worse with: standing and some activites Pain improves with: pacing activities and medication Relief from Meds: 7  Mobility walk without assistance how many minutes can you walk? 10 ability to climb steps?  yes do you drive?  yes  Function employed # of hrs/week 40  Neuro/Psych trouble walking  Prior Studies Any changes since last visit?  no  Physicians involved in your care Any changes since last visit?  no   Family History  Problem Relation Age of Onset  . Diabetes Mother   . Hypertension Mother   . Cancer Paternal Uncle    History   Social History  . Marital Status: Married    Spouse Name: N/A    Number of Children: N/A  . Years of Education: N/A   Social History Main Topics    . Smoking status: Current Every Day Smoker  . Smokeless tobacco: None  . Alcohol Use: None  . Drug Use: None  . Sexually Active: None   Other Topics Concern  . None   Social History Narrative  . None   Past Surgical History  Procedure Date  . Breast surgery   . Spine surgery    Past Medical History  Diagnosis Date  . Cancer    BP 118/60  Pulse 92  Resp 14  Ht 5\' 3"  (1.6 m)  Wt 160 lb (72.576 kg)  BMI 28.34 kg/m2  SpO2 97%  LMP 06/11/2012     Review of Systems  Musculoskeletal: Positive for back pain and gait problem.  All other systems reviewed and are negative.       Objective:   Physical Exam  Constitutional: She is oriented to person, place, and time.  Neurological: She is alert and oriented to person, place, and time.    Constitutional: She is oriented to person, place, and time. She appears well-developed and well-nourished.  HENT:  alopecia  Eyes: Conjunctivae are normal. Pupils are equal, round, and reactive to light.  Musculoskeletal:  Lumbar back: She exhibits decreased range of motion. She exhibits no tenderness and no deformity.  Negative straight leg raising test  Neurological: She is alert and oriented to person, place, and time. She has normal strength. She displays no atrophy. A sensory deficit is present.  Gait normal.  Sensory deficit in the feet hyperalgesia to pinprick below the knees  Psychiatric: She has a normal mood and affect.  Pain over coccyx Pain on plantar surface of foot bilateral Sensation to proprioception is normal      Assessment & Plan:  1. 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  2. Plantar fasciitis, Ice massage continue ibuprofen, start ankle stretching exercise#3. Coccydynia recommend a voiding pressure may use doughnut Recommend increase Neurontin to 800 mg 3 times per day  Continue hydrocodone 10/325 4 times  a day  Ice massage to the feet twice a day I'll see her back in one month will reassess medications.  Discussed walking program 10 minutes 3 times a day on weekends.

## 2012-06-21 ENCOUNTER — Other Ambulatory Visit: Payer: Self-pay | Admitting: Physical Medicine & Rehabilitation

## 2012-07-01 ENCOUNTER — Other Ambulatory Visit: Payer: Self-pay | Admitting: Physical Medicine & Rehabilitation

## 2012-07-03 ENCOUNTER — Ambulatory Visit: Payer: Self-pay | Admitting: Oncology

## 2012-07-11 ENCOUNTER — Other Ambulatory Visit: Payer: Self-pay | Admitting: Physical Medicine & Rehabilitation

## 2012-07-18 ENCOUNTER — Encounter: Payer: BC Managed Care – PPO | Attending: Physical Medicine & Rehabilitation

## 2012-07-18 ENCOUNTER — Ambulatory Visit: Payer: BC Managed Care – PPO | Admitting: Physical Medicine & Rehabilitation

## 2012-07-18 DIAGNOSIS — M961 Postlaminectomy syndrome, not elsewhere classified: Secondary | ICD-10-CM | POA: Insufficient documentation

## 2012-07-18 DIAGNOSIS — IMO0002 Reserved for concepts with insufficient information to code with codable children: Secondary | ICD-10-CM | POA: Insufficient documentation

## 2012-07-21 ENCOUNTER — Other Ambulatory Visit: Payer: Self-pay | Admitting: Physical Medicine & Rehabilitation

## 2012-08-10 ENCOUNTER — Other Ambulatory Visit: Payer: Self-pay | Admitting: Physical Medicine & Rehabilitation

## 2012-08-15 ENCOUNTER — Inpatient Hospital Stay: Payer: Self-pay | Admitting: Physical Medicine & Rehabilitation

## 2012-08-22 ENCOUNTER — Other Ambulatory Visit: Payer: Self-pay | Admitting: Physical Medicine & Rehabilitation

## 2012-08-29 ENCOUNTER — Ambulatory Visit (HOSPITAL_BASED_OUTPATIENT_CLINIC_OR_DEPARTMENT_OTHER): Payer: Self-pay | Admitting: Physical Medicine & Rehabilitation

## 2012-08-29 ENCOUNTER — Encounter: Payer: BC Managed Care – PPO | Attending: Physical Medicine & Rehabilitation

## 2012-08-29 ENCOUNTER — Encounter: Payer: Self-pay | Admitting: Physical Medicine & Rehabilitation

## 2012-08-29 VITALS — BP 120/75 | HR 76 | Resp 14 | Ht 63.0 in | Wt 163.2 lb

## 2012-08-29 DIAGNOSIS — G620 Drug-induced polyneuropathy: Secondary | ICD-10-CM

## 2012-08-29 DIAGNOSIS — M961 Postlaminectomy syndrome, not elsewhere classified: Secondary | ICD-10-CM | POA: Insufficient documentation

## 2012-08-29 DIAGNOSIS — IMO0002 Reserved for concepts with insufficient information to code with codable children: Secondary | ICD-10-CM | POA: Insufficient documentation

## 2012-08-29 DIAGNOSIS — T451X5A Adverse effect of antineoplastic and immunosuppressive drugs, initial encounter: Secondary | ICD-10-CM

## 2012-08-29 MED ORDER — GABAPENTIN 300 MG PO CAPS: 600.0000 mg | ORAL_CAPSULE | Freq: Three times a day (TID) | ORAL | Status: DC

## 2012-08-29 NOTE — Patient Instructions (Addendum)
Will go back to 600 mg of gabapentin Recommend walking 10 minutes 3 times per day on the weekend Can you walk in 5 minutes twice a day at work

## 2012-08-29 NOTE — Progress Notes (Signed)
Subjective:    Patient ID: Elizabeth Torres, female    DOB: 05-01-1976, 37 y.o.   MRN: [id number]  HPI This is a 37 year old female with greater  than 2-year history of lumbar pain and lower extremity pain. She  underwent conservative care including physical therapy and medications  including narcotic analgesics, nonnarcotic, analgesics, NSAIDS, and  Neurontin prior to undergoing a left L5-S1 laminectomy and diskectomy  for herniated nucleus pulposus on Dec 13, 2009. She had a recurrent  disk at that same level and underwent an L5-S1 fusion on January 07, 2010.  Interval medical history positive for diagnosis of right breast  carcinoma. She has undergone lumpectomy as well as a partial mastectomy  on July 26, 2011. She had 16 chemotherapy treatments  , followed by radiation therapy 5 days per week  for 7 wks. Completed radiation sept 25 2013     Mammogram Dec 2013 ok, plan to repeat in 6 months              Increased drowsiness since gabapentin dose was increased from 600 mg to 800 mg          Pain Inventory Average Pain 7 Pain Right Now 7 My pain is constant and aching  In the last 24 hours, has pain interfered with the following? General activity 5 Relation with others 5 Enjoyment of life 5 What TIME of day is your pain at its worst? daytime Sleep (in general) Fair  Pain is worse with: sitting Pain improves with: heat/ice and medication Relief from Meds: 5  Mobility walk without assistance how many minutes can you walk? 5 ability to climb steps?  yes do you drive?  yes  Function employed # of hrs/week 40  Neuro/Psych No problems in this area  Prior Studies Any changes since last visit?  no  Physicians involved in your care Any changes since last visit?  no   Family History  Problem Relation Age of Onset  . Diabetes Mother   . Hypertension Mother   . Cancer Paternal Uncle    History   Social History  . Marital Status: Married    Spouse Name: N/A   Number of Children: N/A  . Years of Education: N/A   Social History Main Topics  . Smoking status: Current Every Day Smoker  . Smokeless tobacco: Never Used  . Alcohol Use: None  . Drug Use: None  . Sexually Active: None   Other Topics Concern  . None   Social History Narrative  . None   Past Surgical History  Procedure Date  . Breast surgery   . Spine surgery    Past Medical History  Diagnosis Date  . Cancer    BP 120/75  Pulse 76  Resp 14  Ht 5\' 3"  (1.6 m)  Wt 163 lb 3.2 oz (74.027 kg)  BMI 28.91 kg/m2  SpO2 96%   Review of Systems  Musculoskeletal: Positive for back pain.  All other systems reviewed and are negative.       Objective:   Physical Exam  alopecia  Eyes: Conjunctivae are normal. Pupils are equal, round, and reactive to light.  Musculoskeletal:  Lumbar back: She exhibits decreased range of motion. She exhibits no tenderness and no deformity.  Negative straight leg raising test  Neurological: She is alert and oriented to person, place, and time. She has normal strength. She displays no atrophy. A sensory deficit is present. Gait normal.  Sensory deficit in the feet hyperalgesia to pinprick  below the knees  Psychiatric: She has a normal mood and affect.  Pain over coccyx Pain on plantar surface of foot bilateral Sensation to proprioception is normal      Assessment & Plan:  1. 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  2. Plantar fasciitis, improved main symptoms are in morning #3. Coccydynia recommend a voiding pressure may use doughnut Recommend Decreased Neurontin to 600 mg 3 times per day  Continue hydrocodone 10/325 4 times a day  Ice massage to the feet twice a day I'll see her back in one month will reassess medications.  Discussed walking program  2 times a day at work.  Encourage walking 10 minutes 3 times a day on  weekends

## 2012-09-07 ENCOUNTER — Other Ambulatory Visit: Payer: Self-pay | Admitting: Physical Medicine & Rehabilitation

## 2012-09-20 ENCOUNTER — Other Ambulatory Visit: Payer: Self-pay | Admitting: Physical Medicine & Rehabilitation

## 2012-09-27 ENCOUNTER — Other Ambulatory Visit: Payer: Self-pay | Admitting: Physical Medicine & Rehabilitation

## 2012-10-01 ENCOUNTER — Ambulatory Visit: Payer: Self-pay | Admitting: Oncology

## 2012-10-04 ENCOUNTER — Other Ambulatory Visit: Payer: Self-pay | Admitting: Physical Medicine & Rehabilitation

## 2012-11-08 ENCOUNTER — Other Ambulatory Visit: Payer: Self-pay | Admitting: Physical Medicine & Rehabilitation

## 2012-11-28 ENCOUNTER — Ambulatory Visit: Payer: BC Managed Care – PPO | Admitting: Physical Medicine & Rehabilitation

## 2012-11-28 ENCOUNTER — Encounter: Payer: BC Managed Care – PPO | Attending: Physical Medicine & Rehabilitation

## 2012-12-09 ENCOUNTER — Other Ambulatory Visit: Payer: Self-pay

## 2012-12-09 MED ORDER — HYDROCODONE-ACETAMINOPHEN 10-325 MG PO TABS: ORAL_TABLET | ORAL | Status: DC

## 2012-12-15 ENCOUNTER — Ambulatory Visit: Payer: Self-pay | Admitting: Oncology

## 2013-01-13 ENCOUNTER — Other Ambulatory Visit: Payer: Self-pay

## 2013-01-13 MED ORDER — HYDROCODONE-ACETAMINOPHEN 10-325 MG PO TABS: ORAL_TABLET | ORAL | Status: DC

## 2013-01-25 ENCOUNTER — Telehealth: Payer: Self-pay

## 2013-01-25 MED ORDER — ZOLPIDEM TARTRATE 5 MG PO TABS: ORAL_TABLET | ORAL | Status: DC

## 2013-01-25 NOTE — Telephone Encounter (Signed)
Ambien called in, patient informed.

## 2013-01-25 NOTE — Telephone Encounter (Signed)
Patient called to get ambien refilled.

## 2013-02-10 ENCOUNTER — Telehealth: Payer: Self-pay

## 2013-02-10 NOTE — Telephone Encounter (Signed)
[facility] called requesting hydrocodone refill.  Patient has not been seen since January.  Patient needs an appointment and to be seen to get further refills.  [facility] informed.

## 2013-02-13 ENCOUNTER — Telehealth: Payer: Self-pay

## 2013-02-13 NOTE — Telephone Encounter (Signed)
She has to have an appointment, Dr. Doroteo Bradford wanted to see her back one month after her app. In January, i guess she never came back after this. Per chart she is on Hydrocodone 10mg , not sure how she got his the last 6 month. She definitely has to be seen

## 2013-02-13 NOTE — Telephone Encounter (Signed)
Patient says she just started a new job.  She needs hydrocodone refill.  She has not been seen since January.  She was wondering if she could schedule an appointment then get enough medication to last until then.  She is afraid of going through withdrawal.  Please advise.

## 2013-02-14 NOTE — Telephone Encounter (Signed)
Notified Elizabeth Torres and she is scheduling an appt.

## 2013-02-20 ENCOUNTER — Encounter
Payer: BC Managed Care – PPO | Attending: Physical Medicine and Rehabilitation | Admitting: Physical Medicine and Rehabilitation

## 2013-02-20 ENCOUNTER — Encounter: Payer: Self-pay | Admitting: Physical Medicine and Rehabilitation

## 2013-02-20 VITALS — BP 126/85 | HR 76 | Resp 14 | Ht 63.0 in | Wt 171.0 lb

## 2013-02-20 DIAGNOSIS — G622 Polyneuropathy due to other toxic agents: Secondary | ICD-10-CM | POA: Insufficient documentation

## 2013-02-20 DIAGNOSIS — Z79899 Other long term (current) drug therapy: Secondary | ICD-10-CM

## 2013-02-20 DIAGNOSIS — Z853 Personal history of malignant neoplasm of breast: Secondary | ICD-10-CM | POA: Insufficient documentation

## 2013-02-20 DIAGNOSIS — M722 Plantar fascial fibromatosis: Secondary | ICD-10-CM | POA: Insufficient documentation

## 2013-02-20 DIAGNOSIS — M961 Postlaminectomy syndrome, not elsewhere classified: Secondary | ICD-10-CM | POA: Insufficient documentation

## 2013-02-20 DIAGNOSIS — G620 Drug-induced polyneuropathy: Secondary | ICD-10-CM

## 2013-02-20 DIAGNOSIS — Z9221 Personal history of antineoplastic chemotherapy: Secondary | ICD-10-CM | POA: Insufficient documentation

## 2013-02-20 DIAGNOSIS — Z5181 Encounter for therapeutic drug level monitoring: Secondary | ICD-10-CM

## 2013-02-20 DIAGNOSIS — T451X5A Adverse effect of antineoplastic and immunosuppressive drugs, initial encounter: Secondary | ICD-10-CM | POA: Insufficient documentation

## 2013-02-20 DIAGNOSIS — Z923 Personal history of irradiation: Secondary | ICD-10-CM | POA: Insufficient documentation

## 2013-02-20 DIAGNOSIS — Z981 Arthrodesis status: Secondary | ICD-10-CM | POA: Insufficient documentation

## 2013-02-20 MED ORDER — HYDROCODONE-ACETAMINOPHEN 10-325 MG PO TABS: ORAL_TABLET | ORAL | Status: DC

## 2013-02-20 NOTE — Progress Notes (Signed)
Subjective:    Patient ID: Elizabeth Torres, female    DOB: Oct 23, 1975, 37 y.o.   MRN: [id number]  HPI This is a 37 year old female with greater  than 2-year history of lumbar pain and lower extremity pain. She  underwent conservative care including physical therapy and medications  including narcotic analgesics, nonnarcotic, analgesics, NSAIDS, and  Neurontin prior to undergoing a left L5-S1 laminectomy and diskectomy  for herniated nucleus pulposus on Dec 13, 2009. She had a recurrent  disk at that same level and underwent an L5-S1 fusion on January 07, 2010.  Interval medical history positive for diagnosis of right breast  carcinoma. She has undergone lumpectomy as well as a partial mastectomy  on July 26, 2011. She had 16 chemotherapy treatments  , followed by radiation therapy 5 days per week  for 7 wks.  Completed radiation sept 25 2013  Mammogram Dec 2013 ok, plan to repeat in 6 months  Increased drowsiness since gabapentin dose was increased from 600 mg to 800 mg   Pain Inventory Average Pain 8 Pain Right Now 8 My pain is constant and aching  In the last 24 hours, has pain interfered with the following? General activity 7 Relation with others 8 Enjoyment of life 8 What TIME of day is your pain at its worst? evening Sleep (in general) Poor  Pain is worse with: standing Pain improves with: heat/ice and medication Relief from Meds: 5  Mobility Do you have any goals in this area?  no  Function Do you have any goals in this area?  no  Neuro/Psych No problems in this area  Prior Studies Any changes since last visit?  no  Physicians involved in your care Any changes since last visit?  no   Family History  Problem Relation Age of Onset  . Diabetes Mother   . Hypertension Mother   . Cancer Paternal Uncle    History   Social History  . Marital Status: Married    Spouse Name: N/A    Number of Children: N/A  . Years of Education: N/A   Social History Main  Topics  . Smoking status: Current Every Day Smoker  . Smokeless tobacco: Never Used  . Alcohol Use: None  . Drug Use: None  . Sexually Active: None   Other Topics Concern  . None   Social History Narrative  . None   Past Surgical History  Procedure Laterality Date  . Breast surgery    . Spine surgery     Past Medical History  Diagnosis Date  . Cancer    BP 126/85  Pulse 76  Resp 14  Ht 5\' 3"  (1.6 m)  Wt 171 lb (77.565 kg)  BMI 30.3 kg/m2  SpO2 97%     Review of Systems  Musculoskeletal: Positive for back pain.  All other systems reviewed and are negative.       Objective:   Physical Exam  Constitutional: She is oriented to person, place, and time. She appears well-developed and well-nourished.  HENT:  Head: Normocephalic.  Neck: Neck supple.  Musculoskeletal: She exhibits tenderness.  Neurological: She is alert and oriented to person, place, and time.  Skin: Skin is warm and dry.  Psychiatric: She has a normal mood and affect.    Symmetric normal motor tone is noted throughout. Normal muscle bulk. Muscle testing reveals 5/5 muscle strength of the upper extremity, and 5/5 of the lower extremity. Full range of motion in upper and lower extremities. ROM of  spine is restricted. Fine motor movements are normal in both hands.  DTR in the upper and lower extremity are present and symmetric trace to 1+. No clonus is noted.  Patient arises from chair without difficulty. Narrow based gait with normal arm swing bilateral , able to walk on heels and toes . Tandem walk is stable. No pronator drift. Rhomberg negative. Good finger to nose and heel to shin testing. No tremor, dystaxia or dysmetria noted.       Assessment & Plan:  1. Lumbar postlaminectomy syndrome status post L5-S1 fusion, 2010, by Dr. 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 Recommended a Vit B12,5,6 supplement to help with the regeneration of her  nerves. 2. Plantar fasciitis, improved main symptoms are in morning, recommended Arnica cream for her foot pain. 3. Hx of breast cancer, mastectomy in Jan. 2013, finished Chemo in Elizabeth 2013 Recommend Decreased Neurontin to 600 mg 3 times per day  Continue hydrocodone 10/325 4 times a day  Ice massage to the feet twice a day  I'll see her back in 3 month Discussed walking program 2 times a day at work. Encourage walking 10 minutes 3 times a day on weekends, also showed patient exercises to strengthen her core muscles, which she should do on a daily if possible

## 2013-02-20 NOTE — Patient Instructions (Signed)
Continue with your walking program, also try to do some of the core exercises we discussed.

## 2013-03-01 ENCOUNTER — Telehealth: Payer: Self-pay | Admitting: *Deleted

## 2013-03-01 NOTE — Telephone Encounter (Signed)
Notified Tabytha of her results and counseled about the risk involved with narcotics and ETOH.

## 2013-03-01 NOTE — Telephone Encounter (Addendum)
Message copied by Doreene Eland on Wed Mar 01, 2013  9:20 AM ------      Message from: Su Monks      Created: Mon Feb 27, 2013  3:21 PM       Please educate patient about the risks, and also please tell her, that if we detect alcohol in her UDS again, we will not prescribe narcotics anymore to her. ------

## 2013-03-14 ENCOUNTER — Other Ambulatory Visit: Payer: Self-pay | Admitting: Physical Medicine and Rehabilitation

## 2013-03-20 ENCOUNTER — Other Ambulatory Visit: Payer: Self-pay

## 2013-03-20 MED ORDER — HYDROCODONE-ACETAMINOPHEN 10-325 MG PO TABS: ORAL_TABLET | ORAL | Status: DC

## 2013-04-06 ENCOUNTER — Other Ambulatory Visit: Payer: Self-pay

## 2013-04-06 MED ORDER — IBUPROFEN 800 MG PO TABS: ORAL_TABLET | ORAL | Status: DC

## 2013-04-19 ENCOUNTER — Other Ambulatory Visit: Payer: Self-pay

## 2013-04-19 ENCOUNTER — Other Ambulatory Visit: Payer: Self-pay | Admitting: *Deleted

## 2013-04-19 MED ORDER — IBUPROFEN 800 MG PO TABS: ORAL_TABLET | ORAL | Status: DC

## 2013-04-19 NOTE — Telephone Encounter (Signed)
[facility] in [place] called about refills on ibuprofen.  [facility], [facility] to [facility] and sent.

## 2013-04-24 ENCOUNTER — Other Ambulatory Visit: Payer: Self-pay | Admitting: Physical Medicine and Rehabilitation

## 2013-04-25 ENCOUNTER — Encounter: Payer: Self-pay | Admitting: Physical Medicine and Rehabilitation

## 2013-04-25 ENCOUNTER — Encounter
Payer: BC Managed Care – PPO | Attending: Physical Medicine and Rehabilitation | Admitting: Physical Medicine and Rehabilitation

## 2013-04-25 VITALS — BP 113/79 | HR 80 | Resp 14 | Ht 63.0 in | Wt 169.4 lb

## 2013-04-25 DIAGNOSIS — F17200 Nicotine dependence, unspecified, uncomplicated: Secondary | ICD-10-CM | POA: Insufficient documentation

## 2013-04-25 DIAGNOSIS — Z9010 Acquired absence of unspecified breast and nipple: Secondary | ICD-10-CM | POA: Insufficient documentation

## 2013-04-25 DIAGNOSIS — M961 Postlaminectomy syndrome, not elsewhere classified: Secondary | ICD-10-CM

## 2013-04-25 DIAGNOSIS — M79609 Pain in unspecified limb: Secondary | ICD-10-CM | POA: Insufficient documentation

## 2013-04-25 DIAGNOSIS — Z853 Personal history of malignant neoplasm of breast: Secondary | ICD-10-CM | POA: Insufficient documentation

## 2013-04-25 DIAGNOSIS — M722 Plantar fascial fibromatosis: Secondary | ICD-10-CM | POA: Insufficient documentation

## 2013-04-25 DIAGNOSIS — M545 Low back pain, unspecified: Secondary | ICD-10-CM | POA: Insufficient documentation

## 2013-04-25 DIAGNOSIS — Z9221 Personal history of antineoplastic chemotherapy: Secondary | ICD-10-CM | POA: Insufficient documentation

## 2013-04-25 DIAGNOSIS — Z79899 Other long term (current) drug therapy: Secondary | ICD-10-CM | POA: Insufficient documentation

## 2013-04-25 MED ORDER — HYDROCODONE-ACETAMINOPHEN 10-325 MG PO TABS: ORAL_TABLET | ORAL | Status: DC

## 2013-04-25 NOTE — Progress Notes (Signed)
Subjective:    Patient ID: Elizabeth Torres, female    DOB: [birth year], 37 y.o.   MRN: [id number]  HPI This is a 37 year old female with greater than 2-year history of lumbar pain and lower extremity pain.The medication is controlling her pain well, she is working and functioning considerably well in her daily life. The problem has been stable.   She underwent conservative care including physical therapy and medications  including narcotic analgesics, nonnarcotic, analgesics, NSAIDS, and  Neurontin prior to undergoing a left L5-S1 laminectomy and diskectomy  for herniated nucleus pulposus on Dec 13, 2009. She had a recurrent  disk at that same level and underwent an L5-S1 fusion on January 07, 2010.  Interval medical history positive for diagnosis of right breast  carcinoma. She has undergone lumpectomy as well as a partial mastectomy  on July 26, 2011. She had 16 chemotherapy treatments  , followed by radiation therapy 5 days per week  for 7 wks.  Completed radiation sept 25 2013  Mammogram Dec 2013 ok, plan to repeat in 6 months    Pain Inventory Average Pain 6 Pain Right Now 7 My pain is constant and aching  In the last 24 hours, has pain interfered with the following? General activity 5 Relation with others 5 Enjoyment of life 4 What TIME of day is your pain at its worst? daytime Sleep (in general) Fair  Pain is worse with: walking, sitting and standing Pain improves with: na Relief from Meds: 5  Mobility walk without assistance ability to climb steps?  yes do you drive?  yes transfers alone Do you have any goals in this area?  no  Function Do you have any goals in this area?  no  Neuro/Psych No problems in this area  Prior Studies Any changes since last visit?  no  Physicians involved in your care Any changes since last visit?  no   Family History  Problem Relation Age of Onset  . Diabetes Mother   . Hypertension Mother   . Cancer Paternal Uncle     History   Social History  . Marital Status: Married    Spouse Name: N/A    Number of Children: N/A  . Years of Education: N/A   Social History Main Topics  . Smoking status: Current Every Day Smoker  . Smokeless tobacco: Never Used  . Alcohol Use: None  . Drug Use: None  . Sexual Activity: None   Other Topics Concern  . None   Social History Narrative  . None   Past Surgical History  Procedure Laterality Date  . Breast surgery    . Spine surgery     Past Medical History  Diagnosis Date  . Cancer    BP 113/79  Pulse 80  Resp 14  Ht 5\' 3"  (1.6 m)  Wt 169 lb 6.4 oz (76.839 kg)  BMI 30.02 kg/m2  SpO2 94%  LMP 03/22/2013    Review of Systems  All other systems reviewed and are negative.       Objective:   Physical Exam Constitutional: She is oriented to person, place, and time. She appears well-developed and well-nourished.  HENT:  Head: Normocephalic.  Neck: Neck supple.  Musculoskeletal: She exhibits tenderness.  Neurological: She is alert and oriented to person, place, and time.  Skin: Skin is warm and dry.  Psychiatric: She has a normal mood and affect.  Symmetric normal motor tone is noted throughout. Normal muscle bulk. Muscle testing reveals 5/5 muscle strength  of the upper extremity, and 5/5 of the lower extremity. Full range of motion in upper and lower extremities. ROM of spine is restricted. Fine motor movements are normal in both hands.  DTR in the upper and lower extremity are present and symmetric trace to 1+. No clonus is noted.  Patient arises from chair without difficulty. Narrow based gait with normal arm swing bilateral , able to walk on heels and toes . Tandem walk is stable. No pronator drift. Rhomberg negative.  Good finger to nose and heel to shin testing. No tremor, dystaxia or dysmetria noted.         Assessment & Plan:  1. Lumbar postlaminectomy syndrome status post L5-S1 fusion, 2010, by Dr. 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 Recommended a Vit B12,5,6 supplement to help with the regeneration of her nerves.  2. Plantar fasciitis, improved main symptoms are in morning, recommended Arnica cream for her foot pain, and ice bottle massage.  3. Hx of breast cancer, mastectomy in Jan. 2013, finished Chemo in July 2013   Continue hydrocodone 10/325 4 times a day, refilled today, she is not taking the gabapentin anymore.   I'll see her back in 1 month  Discussed walking program 2 times a day at work. Encourage walking 10 minutes 3 times a day on weekends, also showed patient exercises to strengthen her core muscles, which she should do on a daily if possible

## 2013-04-25 NOTE — Patient Instructions (Signed)
You can try Arnika cream for your foot pain

## 2013-05-22 ENCOUNTER — Ambulatory Visit: Payer: BC Managed Care – PPO | Admitting: Physical Medicine & Rehabilitation

## 2013-05-25 ENCOUNTER — Ambulatory Visit (HOSPITAL_BASED_OUTPATIENT_CLINIC_OR_DEPARTMENT_OTHER): Payer: BC Managed Care – PPO | Admitting: Physical Medicine & Rehabilitation

## 2013-05-25 ENCOUNTER — Encounter: Payer: Self-pay | Admitting: Physical Medicine & Rehabilitation

## 2013-05-25 ENCOUNTER — Encounter: Payer: BC Managed Care – PPO | Attending: Physical Medicine and Rehabilitation

## 2013-05-25 VITALS — BP 123/80 | HR 67 | Resp 14 | Ht 63.0 in | Wt 168.6 lb

## 2013-05-25 DIAGNOSIS — Z79899 Other long term (current) drug therapy: Secondary | ICD-10-CM | POA: Insufficient documentation

## 2013-05-25 DIAGNOSIS — M545 Low back pain, unspecified: Secondary | ICD-10-CM | POA: Insufficient documentation

## 2013-05-25 DIAGNOSIS — M79609 Pain in unspecified limb: Secondary | ICD-10-CM | POA: Insufficient documentation

## 2013-05-25 DIAGNOSIS — G620 Drug-induced polyneuropathy: Secondary | ICD-10-CM

## 2013-05-25 DIAGNOSIS — Z9221 Personal history of antineoplastic chemotherapy: Secondary | ICD-10-CM | POA: Insufficient documentation

## 2013-05-25 DIAGNOSIS — M722 Plantar fascial fibromatosis: Secondary | ICD-10-CM | POA: Insufficient documentation

## 2013-05-25 DIAGNOSIS — Z9010 Acquired absence of unspecified breast and nipple: Secondary | ICD-10-CM | POA: Insufficient documentation

## 2013-05-25 DIAGNOSIS — T451X5A Adverse effect of antineoplastic and immunosuppressive drugs, initial encounter: Secondary | ICD-10-CM

## 2013-05-25 DIAGNOSIS — Z853 Personal history of malignant neoplasm of breast: Secondary | ICD-10-CM | POA: Insufficient documentation

## 2013-05-25 DIAGNOSIS — M961 Postlaminectomy syndrome, not elsewhere classified: Secondary | ICD-10-CM | POA: Insufficient documentation

## 2013-05-25 DIAGNOSIS — F17200 Nicotine dependence, unspecified, uncomplicated: Secondary | ICD-10-CM | POA: Insufficient documentation

## 2013-05-25 MED ORDER — HYDROCODONE-ACETAMINOPHEN 10-325 MG PO TABS: ORAL_TABLET | ORAL | Status: DC

## 2013-05-25 MED ORDER — ZOLPIDEM TARTRATE 5 MG PO TABS: ORAL_TABLET | ORAL | Status: DC

## 2013-05-25 MED ORDER — PREGABALIN 75 MG PO CAPS: 75.0000 mg | ORAL_CAPSULE | Freq: Two times a day (BID) | ORAL | Status: DC

## 2013-05-25 NOTE — Progress Notes (Signed)
Subjective:    Patient ID: Elizabeth Torres, female    DOB: 03/11/1976, 37 y.o.   MRN: [id number]  HPI This is a 37 year old female with greater  than 2-year history of lumbar pain and lower extremity pain. She  underwent conservative care including physical therapy and medications  including narcotic analgesics, nonnarcotic, analgesics, NSAIDS, and  Neurontin prior to undergoing a left L5-S1 laminectomy and diskectomy  for herniated nucleus pulposus on Dec 13, 2009. She had a recurrent  disk at that same level and underwent an L5-S1 fusion on January 07, 2010.  Interval medical history positive for diagnosis of right breast  carcinoma. She has undergone lumpectomy as well as a partial mastectomy  on July 26, 2011. She had 16 chemotherapy treatments  , followed by radiation therapy 5 days per week  for 7 wks.  Completed radiation sept 25 2013   Started in new job, promoted.  Walking 4 miles a day  Pain Inventory Average Pain 6 Pain Right Now 6 My pain is constant  In the last 24 hours, has pain interfered with the following? General activity 4 Relation with others 3 Enjoyment of life 3 What TIME of day is your pain at its worst? evening Sleep (in general) Fair  Pain is worse with: walking, bending, sitting and standing Pain improves with: heat/ice and medication Relief from Meds: 8  Mobility Do you have any goals in this area?  no  Function Do you have any goals in this area?  no  Neuro/Psych No problems in this area  Prior Studies Any changes since last visit?  no  Physicians involved in your care Any changes since last visit?  no   Family History  Problem Relation Age of Onset  . Diabetes Mother   . Hypertension Mother   . Cancer Paternal Uncle    History   Social History  . Marital Status: Married    Spouse Name: N/A    Number of Children: N/A  . Years of Education: N/A   Social History Main Topics  . Smoking status: Current Every Day Smoker  .  Smokeless tobacco: Never Used  . Alcohol Use: None  . Drug Use: None  . Sexual Activity: None   Other Topics Concern  . None   Social History Narrative  . None   Past Surgical History  Procedure Laterality Date  . Breast surgery    . Spine surgery     Past Medical History  Diagnosis Date  . Cancer    BP 123/80  Pulse 67  Resp 14  Ht 5\' 3"  (1.6 m)  Wt 168 lb 9.6 oz (76.476 kg)  BMI 29.87 kg/m2  SpO2 97%  LMP 03/22/2013    Review of Systems  Musculoskeletal: Positive for back pain.  All other systems reviewed and are negative.       Objective:   Physical Exam alopecia  Eyes: Conjunctivae are normal. Pupils are equal, round, and reactive to light.  Musculoskeletal:  Lumbar back: She exhibits decreased range of motion. She exhibits no tenderness and no deformity.  Negative straight leg raising test  Neurological: She is alert and oriented to person, place, and time. She has normal strength. She displays no atrophy. A sensory deficit is present. Gait normal.  Sensory deficit , decreased sensation in the right foot until 4 cm above the malleoli  Decreased sensation left foot to the malleoli  Decreased left little toe sensation  Psychiatric: She has a normal mood and affect.  Sensation to proprioception is normal        Assessment & Plan:  1. 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   2. Coccydynia continue Donut Recommend lyrica 75mg  twice a day, may titrate upward if ineffective Continue hydrocodone 10/325 4 times a day   PA visit  in one month will reassess medications.  Discussed walking program  new job now walking about 4 miles per day

## 2013-05-25 NOTE — Patient Instructions (Signed)
Pregabalin capsules What is this medicine? PREGABALIN (pre GAB a lin) is used to treat nerve pain from diabetes, shingles, spinal cord injury, and fibromyalgia. It is also used to control seizures in epilepsy. This medicine may be used for other purposes; ask your health care provider or pharmacist if you have questions. What should I tell my health care provider before I take this medicine? They need to know if you have any of these conditions: -bleeding problems -heart disease, including heart failure -history of alcohol or drug abuse -kidney disease -suicidal thoughts, plans, or attempt; a previous suicide attempt by you or a family member -an unusual or allergic reaction to pregabalin, gabapentin, other medicines, foods, dyes, or preservatives -pregnant or trying to get pregnant or trying to conceive with your partner -breast-feeding How should I use this medicine? Take this medicine by mouth with a glass of water. Follow the directions on the prescription label. You can take this medicine with or without food. Take your doses at regular intervals. Do not take your medicine more often than directed. Do not stop taking except on your doctor's advice. A special MedGuide will be given to you by the pharmacist with each prescription and refill. Be sure to read this information carefully each time. Talk to your pediatrician regarding the use of this medicine in children. Special care may be needed. Overdosage: If you think you have taken too much of this medicine contact a poison control center or emergency room at once. NOTE: This medicine is only for you. Do not share this medicine with others. What if I miss a dose? If you miss a dose, take it as soon as you can. If it is almost time for your next dose, take only that dose. Do not take double or extra doses. What may interact with this medicine? -alcohol -certain medicines for blood pressure like captopril, enalapril, or  lisinopril -certain medicines for diabetes, like pioglitazone or rosiglitazone -certain medicines for anxiety or sleep -narcotic medicines for pain This list may not describe all possible interactions. Give your health care provider a list of all the medicines, herbs, non-prescription drugs, or dietary supplements you use. Also tell them if you smoke, drink alcohol, or use illegal drugs. Some items may interact with your medicine. What should I watch for while using this medicine? Tell your doctor or healthcare professional if your symptoms do not start to get better or if they get worse. Visit your doctor or health care professional for regular checks on your progress. Do not stop taking except on your doctor's advice. You may develop a severe reaction. Your doctor will tell you how much medicine to take. Wear a medical identification bracelet or chain if you are taking this medicine for seizures, and carry a card that describes your disease and details of your medicine and dosage times. You may get drowsy or dizzy. Do not drive, use machinery, or do anything that needs mental alertness until you know how this medicine affects you. Do not stand or sit up quickly, especially if you are an older patient. This reduces the risk of dizzy or fainting spells. Alcohol may interfere with the effect of this medicine. Avoid alcoholic drinks. If you have a heart condition, like congestive heart failure, and notice that you are retaining water and have swelling in your hands or feet, contact your health care provider immediately. The use of this medicine may increase the chance of suicidal thoughts or actions. Pay special attention to how you are   responding while on this medicine. Any worsening of mood, or thoughts of suicide or dying should be reported to your health care professional right away. This medicine has caused reduced sperm counts in some men. This may interfere with the ability to father a child. You  should talk to your doctor or health care professional if you are concerned about your fertility. Women who become pregnant while using this medicine for seizures may enroll in the [place] [employer] Antiepileptic Drug Pregnancy Registry by calling ([phone]. This registry collects information about the safety of antiepileptic drug use during pregnancy. What side effects may I notice from receiving this medicine? Side effects that you should report to your doctor or health care professional as soon as possible: -allergic reactions like skin rash, itching or hives, swelling of the face, lips, or tongue -breathing problems -changes in vision -chest pain -confusion -jerking or unusual movements of any part of your body -loss of memory -muscle pain, tenderness, or weakness -suicidal thoughts or other mood changes -swelling of the ankles, feet, hands -unusual bruising or bleeding Side effects that usually do not require medical attention (Report these to your doctor or health care professional if they continue or are bothersome.): -dizziness -drowsiness -dry mouth -headache -nausea -tremors -trouble sleeping -weight gain This list may not describe all possible side effects. Call your doctor for medical advice about side effects. You may report side effects to FDA at 1-800-FDA-1088. Where should I keep my medicine? Keep out of the reach of children. This medicine can be abused. Keep your medicine in a safe place to protect it from theft. Do not share this medicine with anyone. Selling or giving away this medicine is dangerous and against the law. Store at room temperature between 15 and 30 degrees C (59 and 86 degrees F). Throw away any unused medicine after the expiration date. NOTE: This sheet is a summary. It may not cover all possible information. If you have questions about this medicine, talk to your doctor, pharmacist, or health care provider.  2013, [employer]. (01/22/2011  8:00:36 PM)

## 2013-06-23 ENCOUNTER — Encounter: Payer: Self-pay | Admitting: Physical Medicine and Rehabilitation

## 2013-06-23 ENCOUNTER — Encounter
Payer: BC Managed Care – PPO | Attending: Physical Medicine and Rehabilitation | Admitting: Physical Medicine and Rehabilitation

## 2013-06-23 VITALS — BP 121/83 | HR 80 | Resp 14 | Ht 63.0 in | Wt 170.0 lb

## 2013-06-23 DIAGNOSIS — M961 Postlaminectomy syndrome, not elsewhere classified: Secondary | ICD-10-CM | POA: Insufficient documentation

## 2013-06-23 DIAGNOSIS — IMO0002 Reserved for concepts with insufficient information to code with codable children: Secondary | ICD-10-CM | POA: Insufficient documentation

## 2013-06-23 DIAGNOSIS — Z923 Personal history of irradiation: Secondary | ICD-10-CM | POA: Insufficient documentation

## 2013-06-23 DIAGNOSIS — M722 Plantar fascial fibromatosis: Secondary | ICD-10-CM | POA: Insufficient documentation

## 2013-06-23 DIAGNOSIS — Z853 Personal history of malignant neoplasm of breast: Secondary | ICD-10-CM | POA: Insufficient documentation

## 2013-06-23 DIAGNOSIS — Z981 Arthrodesis status: Secondary | ICD-10-CM | POA: Insufficient documentation

## 2013-06-23 DIAGNOSIS — T451X5A Adverse effect of antineoplastic and immunosuppressive drugs, initial encounter: Secondary | ICD-10-CM | POA: Insufficient documentation

## 2013-06-23 DIAGNOSIS — G620 Drug-induced polyneuropathy: Secondary | ICD-10-CM

## 2013-06-23 DIAGNOSIS — Z9221 Personal history of antineoplastic chemotherapy: Secondary | ICD-10-CM | POA: Insufficient documentation

## 2013-06-23 MED ORDER — HYDROCODONE-ACETAMINOPHEN 10-325 MG PO TABS: ORAL_TABLET | ORAL | Status: DC

## 2013-06-23 NOTE — Patient Instructions (Signed)
Continue with your walking

## 2013-06-23 NOTE — Progress Notes (Signed)
Subjective:    Patient ID: Elizabeth Torres, female    DOB: Apr 21, 1976, 37 y.o.   MRN: [id number]  HPI This is a 37 year old female with greater than 2-year history of lumbar pain and lower extremity pain.The medication is controlling her pain well, she is working and functioning considerably well in her daily life. The problem has been stable. Only change is that she has a new job, where she walks 4 miles per day now. She underwent conservative care including physical therapy and medications  including narcotic analgesics, nonnarcotic, analgesics, NSAIDS, and  Neurontin prior to undergoing a left L5-S1 laminectomy and diskectomy  for herniated nucleus pulposus on Dec 13, 2009. She had a recurrent  disk at that same level and underwent an L5-S1 fusion on January 07, 2010.  Interval medical history positive for diagnosis of right breast  carcinoma. She has undergone lumpectomy as well as a partial mastectomy  on July 26, 2011. She had 16 chemotherapy treatments  , followed by radiation therapy 5 days per week  for 7 wks.  Completed radiation sept 25 2013  Next Mammogram on 07/06/13   Pain Inventory Average Pain 6 Pain Right Now 6 My pain is constant and dull  In the last 24 hours, has pain interfered with the following? General activity 5 Relation with others 4 Enjoyment of life 4 What TIME of day is your pain at its worst? evening Sleep (in general) Fair  Pain is worse with: walking, sitting and standing Pain improves with: heat/ice and medication Relief from Meds: 7  Mobility Do you have any goals in this area?  no  Function Do you have any goals in this area?  no  Neuro/Psych No problems in this area  Prior Studies Any changes since last visit?  no  Physicians involved in your care Any changes since last visit?  no   Family History  Problem Relation Age of Onset  . Diabetes Mother   . Hypertension Mother   . Cancer Paternal Uncle    History   Social History   . Marital Status: Married    Spouse Name: N/A    Number of Children: N/A  . Years of Education: N/A   Social History Main Topics  . Smoking status: Current Every Day Smoker  . Smokeless tobacco: Never Used  . Alcohol Use: None  . Drug Use: None  . Sexual Activity: None   Other Topics Concern  . None   Social History Narrative  . None   Past Surgical History  Procedure Laterality Date  . Breast surgery    . Spine surgery     Past Medical History  Diagnosis Date  . Cancer    BP 121/83  Pulse 80  Resp 14  Ht 5\' 3"  (1.6 m)  Wt 170 lb (77.111 kg)  BMI 30.12 kg/m2  SpO2 96%    Review of Systems  All other systems reviewed and are negative.       Objective:   Physical Exam Constitutional: She is oriented to person, place, and time. She appears well-developed and well-nourished.  HENT:  Head: Normocephalic.  Neck: Neck supple.  Musculoskeletal: She exhibits tenderness.  Neurological: She is alert and oriented to person, place, and time.  Skin: Skin is warm and dry.  Psychiatric: She has a normal mood and affect.  Symmetric normal motor tone is noted throughout. Normal muscle bulk. Muscle testing reveals 5/5 muscle strength of the upper extremity, and 5/5 of the lower extremity. Full  range of motion in upper and lower extremities. ROM of spine is restricted. Sensory deficit in both feet, up to the malleoli  Fine motor movements are normal in both hands.  DTR in the upper and lower extremity are present and symmetric trace to 1+. No clonus is noted.  Patient arises from chair without difficulty. Narrow based gait with normal arm swing bilateral , able to walk on heels and toes . Tandem walk is stable. No pronator drift. Rhomberg negative.  Good finger to nose and heel to shin testing. No tremor, dystaxia or dysmetria noted.         Assessment & Plan:  1. Lumbar postlaminectomy syndrome status post L5-S1 fusion, 2010, by Dr. 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 Recommended a Vit B12,5,6 supplement to help with the regeneration of her nerves.  2. Plantar fasciitis, improved main symptoms are in morning, recommended Arnica cream for her foot pain, and ice bottle massage.  3. Hx of breast cancer, mastectomy in Jan. 2013, finished Chemo in July 2013  Continue hydrocodone 10/325, 4 times a day, refilled today, she is not taking the Lyrica at this time her insurance has not approved this medication yet.  back in 1 month   Encourage her to continue her walking , and recommended to wear shoes with good cushion. Also showed patient exercises to strengthen her core muscles,again this visit, which she should do on a daily if possible

## 2013-07-06 ENCOUNTER — Ambulatory Visit: Payer: Self-pay | Admitting: Oncology

## 2013-07-07 LAB — CANCER ANTIGEN 27.29: CA 27.29: 20.8 U/mL (ref 0.0–38.6)

## 2013-07-20 ENCOUNTER — Encounter
Payer: BC Managed Care – PPO | Attending: Physical Medicine and Rehabilitation | Admitting: Physical Medicine and Rehabilitation

## 2013-07-21 ENCOUNTER — Ambulatory Visit (HOSPITAL_BASED_OUTPATIENT_CLINIC_OR_DEPARTMENT_OTHER): Payer: BC Managed Care – PPO | Admitting: Physical Medicine & Rehabilitation

## 2013-07-21 ENCOUNTER — Telehealth: Payer: Self-pay

## 2013-07-21 ENCOUNTER — Encounter: Payer: Self-pay | Admitting: Physical Medicine & Rehabilitation

## 2013-07-21 ENCOUNTER — Encounter: Payer: BC Managed Care – PPO | Attending: Physical Medicine and Rehabilitation

## 2013-07-21 VITALS — BP 105/66 | HR 74 | Resp 14 | Ht 63.0 in | Wt 168.6 lb

## 2013-07-21 DIAGNOSIS — M961 Postlaminectomy syndrome, not elsewhere classified: Secondary | ICD-10-CM

## 2013-07-21 DIAGNOSIS — M722 Plantar fascial fibromatosis: Secondary | ICD-10-CM | POA: Insufficient documentation

## 2013-07-21 DIAGNOSIS — Z853 Personal history of malignant neoplasm of breast: Secondary | ICD-10-CM | POA: Insufficient documentation

## 2013-07-21 DIAGNOSIS — Z79899 Other long term (current) drug therapy: Secondary | ICD-10-CM | POA: Insufficient documentation

## 2013-07-21 DIAGNOSIS — Z9221 Personal history of antineoplastic chemotherapy: Secondary | ICD-10-CM | POA: Insufficient documentation

## 2013-07-21 DIAGNOSIS — M545 Low back pain, unspecified: Secondary | ICD-10-CM | POA: Insufficient documentation

## 2013-07-21 DIAGNOSIS — G620 Drug-induced polyneuropathy: Secondary | ICD-10-CM

## 2013-07-21 DIAGNOSIS — T451X5A Adverse effect of antineoplastic and immunosuppressive drugs, initial encounter: Secondary | ICD-10-CM

## 2013-07-21 DIAGNOSIS — M79609 Pain in unspecified limb: Secondary | ICD-10-CM | POA: Insufficient documentation

## 2013-07-21 DIAGNOSIS — Z5181 Encounter for therapeutic drug level monitoring: Secondary | ICD-10-CM

## 2013-07-21 DIAGNOSIS — F17200 Nicotine dependence, unspecified, uncomplicated: Secondary | ICD-10-CM | POA: Insufficient documentation

## 2013-07-21 DIAGNOSIS — Z9010 Acquired absence of unspecified breast and nipple: Secondary | ICD-10-CM | POA: Insufficient documentation

## 2013-07-21 MED ORDER — HYDROCODONE-ACETAMINOPHEN 10-325 MG PO TABS: ORAL_TABLET | ORAL | Status: DC

## 2013-07-21 MED ORDER — AZITHROMYCIN 250 MG PO TABS: ORAL_TABLET | ORAL | Status: DC

## 2013-07-21 MED ORDER — PREGABALIN 75 MG PO CAPS: 75.0000 mg | ORAL_CAPSULE | Freq: Two times a day (BID) | ORAL | Status: DC

## 2013-07-21 NOTE — Addendum Note (Signed)
Addended by: Doreene Eland on: 07/21/2013 11:44 AM   Modules accepted: Orders

## 2013-07-21 NOTE — Telephone Encounter (Signed)
Patient needs a PA for Lyrica.

## 2013-07-21 NOTE — Progress Notes (Signed)
Subjective:    Patient ID: Elizabeth Torres, female    DOB: 04/13/1976, 37 y.o.   MRN: [id number] This is a 37 year old female with greater  than 2-year history of lumbar pain and lower extremity pain. She  underwent conservative care including physical therapy and medications  including narcotic analgesics, nonnarcotic, analgesics, NSAIDS, and  Neurontin prior to undergoing a left L5-S1 laminectomy and diskectomy  for herniated nucleus pulposus on Dec 13, 2009. She had a recurrent  disk at that same level and underwent an L5-S1 fusion on January 07, 2010.  Interval medical history positive for diagnosis of right breast  carcinoma. She has undergone lumpectomy as well as a partial mastectomy  on July 26, 2011. She had 16 chemotherapy treatments  , followed by radiation therapy 5 days per week  for 7 wks.  Completed radiation sept 25 2013   HPI Pain Inventory Average Pain 6 Pain Right Now 6 My pain is constant and dull  In the last 24 hours, has pain interfered with the following? General activity 5 Relation with others 4 Enjoyment of life 4 What TIME of day is your pain at its worst? evening Sleep (in general) Fair  Pain is worse with: walking, sitting and standing Pain improves with: medication Relief from Meds: 6  Mobility walk without assistance transfers alone Do you have any goals in this area?  no  Function Do you have any goals in this area?  no  Neuro/Psych No problems in this area  Prior Studies Any changes since last visit?  no  Physicians involved in your care Any changes since last visit?  no   Family History  Problem Relation Age of Onset  . Diabetes Mother   . Hypertension Mother   . Cancer Paternal Uncle    History   Social History  . Marital Status: Married    Spouse Name: N/A    Number of Children: N/A  . Years of Education: N/A   Social History Main Topics  . Smoking status: Current Every Day Smoker  . Smokeless tobacco: Never Used    . Alcohol Use: None  . Drug Use: None  . Sexual Activity: None   Other Topics Concern  . None   Social History Narrative  . None   Past Surgical History  Procedure Laterality Date  . Breast surgery    . Spine surgery     Past Medical History  Diagnosis Date  . Cancer    BP 105/66  Pulse 74  Resp 14  Ht 5\' 3"  (1.6 m)  Wt 168 lb 9.6 oz (76.476 kg)  BMI 29.87 kg/m2  SpO2 95%      Review of Systems  Musculoskeletal: Positive for back pain.  All other systems reviewed and are negative.       Objective:   Physical Exam  Eyes: Conjunctivae are normal. Pupils are equal, round, and reactive to light.  Musculoskeletal:  Lumbar back: She exhibits decreased range of motion. She exhibits no tenderness and no deformity.  Negative straight leg raising test  Neurological: She is alert and oriented to person, place, and time. She has normal strength. She displays no atrophy. Gait normal.  Sensory deficit , decreased sensation in the bilat  feet until medial malleoli  Decreased sensation left foot to the malleoli  Decreased left little toe sensation  Psychiatric: She has a normal mood and affect.  Sensation to vibratory is normal       Assessment & Plan:  1. Lumbar postlaminectomy syndrome status post L5-S1 fusion with chronic S1 radiculopathy now has superimposed chemotherapy-induced polyneuropathy affecting plantar surface of both.  2. Coccydynia chronic  Recommend lyrica 75mg  twice a day, may titrate upward if ineffective, no benefit from gabapentin and it caused lethargy Continue hydrocodone 10/325 4 times a day  PA visit in one month will monitor medications.  Discussed walking program new job now walking about 2mi per day

## 2013-07-21 NOTE — Patient Instructions (Signed)
We will check on insurance pre authorization for lyrica

## 2013-07-24 NOTE — Telephone Encounter (Signed)
Prior authorization has been sent to insurance.

## 2013-08-03 ENCOUNTER — Ambulatory Visit: Payer: Self-pay | Admitting: Oncology

## 2013-08-17 ENCOUNTER — Encounter: Payer: Self-pay | Admitting: Physical Medicine and Rehabilitation

## 2013-08-17 ENCOUNTER — Encounter: Payer: 59 | Attending: Physical Medicine and Rehabilitation | Admitting: Physical Medicine and Rehabilitation

## 2013-08-17 VITALS — BP 126/70 | HR 82 | Resp 14 | Ht 63.0 in | Wt 169.0 lb

## 2013-08-17 DIAGNOSIS — G620 Drug-induced polyneuropathy: Secondary | ICD-10-CM | POA: Insufficient documentation

## 2013-08-17 DIAGNOSIS — IMO0002 Reserved for concepts with insufficient information to code with codable children: Secondary | ICD-10-CM | POA: Insufficient documentation

## 2013-08-17 DIAGNOSIS — M961 Postlaminectomy syndrome, not elsewhere classified: Secondary | ICD-10-CM | POA: Insufficient documentation

## 2013-08-17 DIAGNOSIS — T451X5A Adverse effect of antineoplastic and immunosuppressive drugs, initial encounter: Secondary | ICD-10-CM | POA: Insufficient documentation

## 2013-08-17 DIAGNOSIS — Z79899 Other long term (current) drug therapy: Secondary | ICD-10-CM

## 2013-08-17 MED ORDER — HYDROCODONE-ACETAMINOPHEN 10-325 MG PO TABS: ORAL_TABLET | ORAL | Status: DC

## 2013-08-17 MED ORDER — ZOLPIDEM TARTRATE 5 MG PO TABS: ORAL_TABLET | ORAL | Status: DC

## 2013-08-17 NOTE — Progress Notes (Signed)
Subjective:    Patient ID: Elizabeth Torres, female    DOB: 22-Jun-1976, 38 y.o.   MRN: [id number]  HPI Elizabeth Torres is a 38 year old female with greater  than 2-year history of lumbar pain and lower extremity pain. She underwent conservative care including physical therapy and medications  including narcotic analgesics, nonnarcotic, analgesics, NSAIDS, and Neurontin prior to undergoing a left L5-S1 laminectomy and diskectomy  for herniated nucleus pulposus on Dec 13, 2009. She had a recurrent disk at that same level and underwent an L5-S1 fusion on January 07, 2010.   Interval medical history positive for diagnosis of right breast  carcinoma. She has undergone lumpectomy as well as a partial mastectomy on July 26, 2011. She had 16 chemotherapy treatments  followed by radiation therapy 5 days per week for 7 wks--completed treatment Sept 25 th 2013. She is here for follow up on refill of medications. She has not started lyrica yet due to confusion pre-authorization issues. She has started an aerobic exercise program with friend but reports worsening of pain in feet with jumping activity. 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   The problem has been stable, no new medical issues.  Please see nurse's note for review of systems   Review of Systems   Pain Inventory Average Pain 6 Pain Right Now 6 My pain is constant, dull and aching  In the last 24 hours, has pain interfered with the following? General activity 4 Relation with others 4 Enjoyment of life 4 What TIME of day is your pain at its worst? evening Sleep (in general) Fair  Pain is worse with: walking, bending and sitting Pain improves with: heat/ice and medication Relief from Meds: 7  Mobility walk without assistance transfers alone Do you have any goals in this area?  no  Function Do you have any goals in this area?  no  Neuro/Psych No problems in this area  Prior Studies Any changes  since last visit?  no  Physicians involved in your care Any changes since last visit?  no   Family History  Problem Relation Age of Onset  . Diabetes Mother   . Hypertension Mother   . Cancer Paternal Uncle    History   Social History  . Marital Status: Married    Spouse Name: N/A    Number of Children: N/A  . Years of Education: N/A   Social History Main Topics  . Smoking status: Current Every Day Smoker  . Smokeless tobacco: Never Used  . Alcohol Use: None  . Drug Use: None  . Sexual Activity: None   Other Topics Concern  . None   Social History Narrative  . None   Past Surgical History  Procedure Laterality Date  . Breast surgery    . Spine surgery     Past Medical History  Diagnosis Date  . Cancer    BP 126/70  Pulse 82  Resp 14  Ht 5\' 3"  (1.6 m)  Wt 169 lb (76.658 kg)  BMI 29.94 kg/m2  SpO2 97%      Review of Systems  Musculoskeletal: Positive for back pain.  Neurological: Positive for numbness.  All other systems reviewed and are negative.       Objective:   Physical Exam  Nursing note and vitals reviewed. Constitutional: She is oriented to person, place, and time. She appears well-developed and well-nourished.  HENT:  Head: Normocephalic and atraumatic.  Eyes: Conjunctivae are normal.  Pupils are equal, round, and reactive to light.  Neck: Normal range of motion. Neck supple.  Cardiovascular: Normal rate and regular rhythm.   No murmur heard. Pulmonary/Chest: Effort normal and breath sounds normal. No respiratory distress. She has no wheezes.  Abdominal: Soft. Bowel sounds are normal. She exhibits no distension.  Musculoskeletal: She exhibits no edema and no tenderness.  Decreased ROM lower back. No pain with SLR. Has decreased sensation in the Left> right foott until medial malleoli.   Neurological: She is alert and oriented to person, place, and time.  Skin: Skin is warm and dry.  Psychiatric: She has a normal mood and affect. Her  behavior is normal. Judgment and thought content normal.          Assessment & Plan:  1. Lumbar postlaminectomy syndrome status post L5-S1 fusion with chronic S1 radiculopathy:  Hydrocodone and ibuprofen effective for pain management. Refilled:  Hydrocodone 10/325 mg #120--one every 6 hours as needed for pain. No refills--must last 30 days.   Ambien 5 mg # 30 pills with  Refills.  2. 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 She did not like neurontin due to sedative SE. Advised to start lyrica at 75 mg/HS for a week then increase to bid to prvent SE.   3. Preventive care: She was concerned about exercise worsening her symptoms. Recommended starting with bike or walking. Or taking classes that promoted gentle stretching or 3-5 lbs weight activity to slowly build up endurance /strenght prior to initiating full force areobic activity.  Also advised to use good support shoes to prevent exacerbation of back/neuropathic symptoms. Smoking cessation discussed but patient not interested.

## 2013-08-17 NOTE — Patient Instructions (Addendum)
Start Lyrica as one pill at bedtime for a week. Then increase to twice a day.  This will help decrease sedative effects Call the insurance company and get authorization number.

## 2013-09-15 ENCOUNTER — Other Ambulatory Visit: Payer: Self-pay | Admitting: Physical Medicine & Rehabilitation

## 2013-09-15 ENCOUNTER — Ambulatory Visit: Payer: Self-pay | Admitting: Family Medicine

## 2013-09-15 LAB — CBC AND DIFFERENTIAL: Hemoglobin: 14.1 g/dL (ref 12.0–16.0)

## 2013-09-18 ENCOUNTER — Other Ambulatory Visit: Payer: Self-pay | Admitting: Physical Medicine & Rehabilitation

## 2013-09-21 ENCOUNTER — Encounter: Payer: Self-pay | Admitting: Physical Medicine and Rehabilitation

## 2013-09-21 ENCOUNTER — Encounter: Payer: 59 | Attending: Physical Medicine and Rehabilitation | Admitting: Physical Medicine and Rehabilitation

## 2013-09-21 VITALS — BP 109/69 | HR 86 | Resp 14 | Ht 63.0 in | Wt 163.0 lb

## 2013-09-21 DIAGNOSIS — Z9221 Personal history of antineoplastic chemotherapy: Secondary | ICD-10-CM | POA: Insufficient documentation

## 2013-09-21 DIAGNOSIS — M545 Low back pain, unspecified: Secondary | ICD-10-CM | POA: Insufficient documentation

## 2013-09-21 DIAGNOSIS — Z9010 Acquired absence of unspecified breast and nipple: Secondary | ICD-10-CM | POA: Insufficient documentation

## 2013-09-21 DIAGNOSIS — Z791 Long term (current) use of non-steroidal anti-inflammatories (NSAID): Secondary | ICD-10-CM

## 2013-09-21 DIAGNOSIS — F17200 Nicotine dependence, unspecified, uncomplicated: Secondary | ICD-10-CM | POA: Insufficient documentation

## 2013-09-21 DIAGNOSIS — M961 Postlaminectomy syndrome, not elsewhere classified: Secondary | ICD-10-CM | POA: Insufficient documentation

## 2013-09-21 DIAGNOSIS — T451X5A Adverse effect of antineoplastic and immunosuppressive drugs, initial encounter: Secondary | ICD-10-CM | POA: Insufficient documentation

## 2013-09-21 DIAGNOSIS — C50919 Malignant neoplasm of unspecified site of unspecified female breast: Secondary | ICD-10-CM | POA: Insufficient documentation

## 2013-09-21 DIAGNOSIS — R209 Unspecified disturbances of skin sensation: Secondary | ICD-10-CM | POA: Insufficient documentation

## 2013-09-21 DIAGNOSIS — G620 Drug-induced polyneuropathy: Secondary | ICD-10-CM | POA: Insufficient documentation

## 2013-09-21 MED ORDER — HYDROCODONE-ACETAMINOPHEN 10-325 MG PO TABS: ORAL_TABLET | ORAL | Status: DC

## 2013-09-21 NOTE — Progress Notes (Signed)
Subjective:    Patient ID: Elizabeth Torres, female    DOB: 08/09/1975, 38 y.o.   MRN: [id number]  HPI  Elizabeth Torres is a 38 year old female with greater than 2-year history of lumbar pain, BLE as well as numbness/tingling in her feet.  She has had partial mastectomy with chemo/XRT for breast cancer and has chemo induced polyneuropathy.  She is here for efill of pain medications. She has still not started lyrica yet due to pre-authorization issues. She has been sick for the past three weeks and has required couple of MD visits before inally getting over pneumonia. She continues on Levaquin and has not needed cough medication past two nights. She has not been able to exercise in the past month due to illness.   She was given Cheratussin by PMD due to cough affecting sleep. She was educated that on narcotic containing cough syrups and advised to call our office prior to refilling Rx next time.   Pain Inventory Average Pain 7 Pain Right Now 7 My pain is constant, dull and aching  In the last 24 hours, has pain interfered with the following? General activity 5 Relation with others 5 Enjoyment of life 4 What TIME of day is your pain at its worst? daytime Sleep (in general) Fair  Pain is worse with: walking, bending and inactivity Pain improves with: heat/ice and medication Relief from Meds: 7  Mobility walk without assistance transfers alone Do you have any goals in this area?  no  Function Do you have any goals in this area?  no  Neuro/Psych No problems in this area  Prior Studies Any changes since last visit?  yes x-rays  Physicians involved in your care Any changes since last visit?  yes Primary care Dr. Beverely Low   Family History  Problem Relation Age of Onset  . Diabetes Mother   . Hypertension Mother   . Cancer Paternal Uncle    History   Social History  . Marital Status: Married    Spouse Name: N/A    Number of Children: N/A  . Years of Education: N/A    Social History Main Topics  . Smoking status: Current Every Day Smoker -- 1.00 packs/day    Types: Cigarettes  . Smokeless tobacco: Never Used  . Alcohol Use: No  . Drug Use: None  . Sexual Activity: None   Other Topics Concern  . None   Social History Narrative  . None   Past Surgical History  Procedure Laterality Date  . Spine surgery  2008    Dr Joya Salm  . Mastectomy Right     partial with lymph node dissection  . Breast surgery     Past Medical History  Diagnosis Date  . Cancer    BP 109/69  Pulse 86  Resp 14  Ht 5\' 3"  (1.6 m)  Wt 163 lb (73.936 kg)  BMI 28.88 kg/m2  SpO2 96%  Opioid Risk Score:   Fall Risk Score: Low Fall Risk (0-5 points) (pt educated on fall risk, brochure given to pt.)    Review of Systems  Respiratory: Positive for cough, shortness of breath and wheezing.   Musculoskeletal: Positive for back pain.  All other systems reviewed and are negative.       Objective:   Physical Exam  Nursing note and vitals reviewed. Constitutional: She is oriented to person, place, and time. She appears well-developed and well-nourished.  HENT:  Head: Normocephalic and atraumatic.  Eyes: Pupils are equal, round,  and reactive to light.  Neck: Normal range of motion.  Cardiovascular: Normal rate and regular rhythm.   Pulmonary/Chest: Effort normal and breath sounds normal. No respiratory distress. She has no wheezes.  Congested, non-productive cough noted.   Musculoskeletal: She exhibits no edema.  Decreased ROM lower back. No pain with SLR. Has decreased sensation in the Left> right foott until medial malleoli. Gets out of chair without difficulty. Normal gait.   Neurological: She is alert and oriented to person, place, and time.  Psychiatric: She has a normal mood and affect. Her behavior is normal. Judgment and thought content normal.          Assessment & Plan:  1. Lumbar postlaminectomy syndrome status post L5-S1 fusion with chronic S1  radiculopathy: Hydrocodone and ibuprofen effective for pain management. She expressed concerns that her LFTs/lytes have not been checked in two years. She was given Rx to have CMET drawn at local lab.  Refilled: Hydrocodone 10/325 mg #120--one every 6 hours as needed for pain.    2. 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 She last checked with pharmacy  3 weeks ago. Told her to call them again. Again advised to start lyrica at 75 mg/HS for a week then increase to bid to prvent SE.

## 2013-09-21 NOTE — Patient Instructions (Signed)
Be aware of cough medication with narcotics. Call office prior to filling Rx.

## 2013-10-13 ENCOUNTER — Other Ambulatory Visit: Payer: Self-pay | Admitting: *Deleted

## 2013-10-13 MED ORDER — HYDROCODONE-ACETAMINOPHEN 10-325 MG PO TABS: ORAL_TABLET | ORAL | Status: DC

## 2013-10-13 NOTE — Telephone Encounter (Signed)
RX printed for MD to sign for RN visit 10/17/13 

## 2013-10-18 ENCOUNTER — Encounter: Payer: 59 | Attending: Physical Medicine & Rehabilitation | Admitting: *Deleted

## 2013-10-18 VITALS — BP 115/64 | HR 69 | Resp 14

## 2013-10-18 DIAGNOSIS — Z923 Personal history of irradiation: Secondary | ICD-10-CM | POA: Insufficient documentation

## 2013-10-18 DIAGNOSIS — Z9221 Personal history of antineoplastic chemotherapy: Secondary | ICD-10-CM | POA: Insufficient documentation

## 2013-10-18 DIAGNOSIS — T451X5A Adverse effect of antineoplastic and immunosuppressive drugs, initial encounter: Secondary | ICD-10-CM | POA: Insufficient documentation

## 2013-10-18 DIAGNOSIS — M5412 Radiculopathy, cervical region: Secondary | ICD-10-CM | POA: Insufficient documentation

## 2013-10-18 DIAGNOSIS — M961 Postlaminectomy syndrome, not elsewhere classified: Secondary | ICD-10-CM | POA: Insufficient documentation

## 2013-10-18 DIAGNOSIS — Z981 Arthrodesis status: Secondary | ICD-10-CM | POA: Insufficient documentation

## 2013-10-18 DIAGNOSIS — F17200 Nicotine dependence, unspecified, uncomplicated: Secondary | ICD-10-CM | POA: Insufficient documentation

## 2013-10-18 DIAGNOSIS — G620 Drug-induced polyneuropathy: Secondary | ICD-10-CM | POA: Insufficient documentation

## 2013-10-18 DIAGNOSIS — C50919 Malignant neoplasm of unspecified site of unspecified female breast: Secondary | ICD-10-CM | POA: Insufficient documentation

## 2013-10-18 NOTE — Progress Notes (Signed)
Here for pill count and medication refills. Hydrocodone 10/325 # 120  Fill date 09/21/13    Today NV# 3  VSS  Dominik has had no falls and is a low fall risk.  She was educated and given a fall prevention handout at previous visit.  No changes.  She has completed her use of cough medication so it has been removed from her list.  She will return to the clinic in one month for RN med refill and 2 months with Dr Letta Pate.

## 2013-10-18 NOTE — Patient Instructions (Signed)
Follow up one month with RN for med management and 2 months with Dr Letta Pate

## 2013-11-15 ENCOUNTER — Encounter: Payer: Self-pay | Admitting: Registered Nurse

## 2013-11-15 ENCOUNTER — Encounter: Payer: 59 | Attending: Physical Medicine and Rehabilitation | Admitting: Registered Nurse

## 2013-11-15 VITALS — BP 114/77 | HR 79 | Resp 14 | Ht 63.0 in | Wt 164.0 lb

## 2013-11-15 DIAGNOSIS — Z5189 Encounter for other specified aftercare: Secondary | ICD-10-CM | POA: Insufficient documentation

## 2013-11-15 DIAGNOSIS — Z79899 Other long term (current) drug therapy: Secondary | ICD-10-CM

## 2013-11-15 DIAGNOSIS — M961 Postlaminectomy syndrome, not elsewhere classified: Secondary | ICD-10-CM | POA: Insufficient documentation

## 2013-11-15 DIAGNOSIS — T451X5A Adverse effect of antineoplastic and immunosuppressive drugs, initial encounter: Secondary | ICD-10-CM | POA: Insufficient documentation

## 2013-11-15 DIAGNOSIS — Z5181 Encounter for therapeutic drug level monitoring: Secondary | ICD-10-CM | POA: Insufficient documentation

## 2013-11-15 DIAGNOSIS — G620 Drug-induced polyneuropathy: Secondary | ICD-10-CM | POA: Insufficient documentation

## 2013-11-15 MED ORDER — HYDROCODONE-ACETAMINOPHEN 10-325 MG PO TABS: ORAL_TABLET | ORAL | Status: DC

## 2013-11-15 NOTE — Progress Notes (Signed)
Subjective:    Patient ID: Elizabeth Torres, female    DOB: July 17, 1976, 38 y.o.   MRN: [id number]  HPI: Elizabeth Torres is a 38 year old female who returns for follow up for chronic pain and medication refill. Her history includes a 2-year history of lumbar pain, BLE as well as numbness/tingling in her feet. She has had partial mastectomy with chemo/XRT for breast cancer and has chemo induced polyneuropathy. Her surgical history includes left L5-S1 laminectomy and diskectomy for herniated nucleus pulposus on Dec 13, 2009. She had a recurrent disk at that same level and underwent an L5-S1 fusion on January 07, 2010.    Her pain is usually in her lower back and bilateral lower extremities.She rated her pain last night at 7. She denies any pain at this time. Her current exercise regime is walking and exercise DVD insanity. She still waiting on authorization for Lyrica.  Pain Inventory Average Pain 6 Pain Right Now 7 My pain is constant, dull and aching  In the last 24 hours, has pain interfered with the following? General activity 5 Relation with others 5 Enjoyment of life 4 What TIME of day is your pain at its worst? evening Sleep (in general) Fair  Pain is worse with: walking, sitting, inactivity and standing Pain improves with: rest, heat/ice and medication Relief from Meds: 5  Mobility walk without assistance  Function Do you have any goals in this area?  no  Neuro/Psych No problems in this area  Prior Studies Any changes since last visit?  no  Physicians involved in your care Any changes since last visit?  no   Family History  Problem Relation Age of Onset  . Diabetes Mother   . Hypertension Mother   . Cancer Paternal Uncle    History   Social History  . Marital Status: Married    Spouse Name: N/A    Number of Children: N/A  . Years of Education: N/A   Social History Main Topics  . Smoking status: Current Every Day Smoker -- 1.00 packs/day    Types: Cigarettes    . Smokeless tobacco: Never Used  . Alcohol Use: No  . Drug Use: None  . Sexual Activity: None   Other Topics Concern  . None   Social History Narrative  . None   Past Surgical History  Procedure Laterality Date  . Spine surgery  2008    Dr Joya Salm  . Mastectomy Right     partial with lymph node dissection  . Breast surgery     Past Medical History  Diagnosis Date  . Cancer    BP 114/77  Pulse 79  Resp 14  Ht 5\' 3"  (1.6 m)  Wt 164 lb (74.39 kg)  BMI 29.06 kg/m2  SpO2 98%  Opioid Risk Score:   Fall Risk Score: Low Fall Risk (0-5 points) (educated and handout given on fall prevention at previous visit)  Review of Systems  Musculoskeletal: Positive for back pain.  All other systems reviewed and are negative.      Objective:   Physical Exam  Constitutional: She is oriented to person, place, and time. She appears well-developed and well-nourished.  HENT:  Head: Normocephalic.  Neck: Normal range of motion. Neck supple.  Cardiovascular: Normal rate, regular rhythm and normal heart sounds.   Pulmonary/Chest: Effort normal and breath sounds normal.  Musculoskeletal: Normal range of motion.  Neurological: She is alert and oriented to person, place, and time.  Skin: Skin is  warm and dry.  Psychiatric: She has a normal mood and affect.          Assessment & Plan:  1. Lumbar postlaminectomy syndrome status post L5-S1 fusion with chronic S1 radiculopathy Refilled: Hydrocodone 10/325 mg #120--one every 6 hours as needed for pain.  2. 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  20 minutes of face to face time was spent during this visit. All questions were encouraged and answered.

## 2013-11-27 ENCOUNTER — Other Ambulatory Visit: Payer: Self-pay | Admitting: Physical Medicine & Rehabilitation

## 2013-11-27 NOTE — Progress Notes (Signed)
Urine drug screen results from 11/15/2013 are consistent.

## 2013-12-03 ENCOUNTER — Emergency Department: Payer: Self-pay | Admitting: Internal Medicine

## 2013-12-12 ENCOUNTER — Ambulatory Visit (HOSPITAL_BASED_OUTPATIENT_CLINIC_OR_DEPARTMENT_OTHER): Payer: 59 | Admitting: Physical Medicine & Rehabilitation

## 2013-12-12 ENCOUNTER — Encounter: Payer: Self-pay | Admitting: Physical Medicine & Rehabilitation

## 2013-12-12 ENCOUNTER — Encounter: Payer: 59 | Attending: Physical Medicine and Rehabilitation

## 2013-12-12 VITALS — BP 120/77 | HR 82 | Resp 14 | Ht 63.0 in | Wt 163.4 lb

## 2013-12-12 DIAGNOSIS — G620 Drug-induced polyneuropathy: Secondary | ICD-10-CM

## 2013-12-12 DIAGNOSIS — M961 Postlaminectomy syndrome, not elsewhere classified: Secondary | ICD-10-CM

## 2013-12-12 DIAGNOSIS — T451X5A Adverse effect of antineoplastic and immunosuppressive drugs, initial encounter: Secondary | ICD-10-CM

## 2013-12-12 DIAGNOSIS — Z79899 Other long term (current) drug therapy: Secondary | ICD-10-CM | POA: Insufficient documentation

## 2013-12-12 DIAGNOSIS — Z5181 Encounter for therapeutic drug level monitoring: Secondary | ICD-10-CM | POA: Insufficient documentation

## 2013-12-12 MED ORDER — NORTRIPTYLINE HCL 10 MG PO CAPS: 10.0000 mg | ORAL_CAPSULE | Freq: Every day | ORAL | Status: DC

## 2013-12-12 MED ORDER — HYDROCODONE-ACETAMINOPHEN 10-325 MG PO TABS: ORAL_TABLET | ORAL | Status: DC

## 2013-12-12 NOTE — Patient Instructions (Signed)
Nortriptyline capsules What is this medicine? NORTRIPTYLINE (nor TRIP ti leen) is used to treat depression. This medicine may be used for other purposes; ask your health care provider or pharmacist if you have questions. COMMON BRAND NAME(S): Aventyl, Pamelor What should I tell my health care provider before I take this medicine? They need to know if you have any of these conditions: -an alcohol problem -bipolar disorder or schizophrenia -difficulty passing urine, prostate trouble -glaucoma -heart disease or recent heart attack -liver disease -over active thyroid -seizures -thoughts or plans of suicide or a previous suicide attempt or family history of suicide attempt -an unusual or allergic reaction to nortriptyline, other medicines, foods, dyes, or preservatives -pregnant or trying to get pregnant -breast-feeding How should I use this medicine? Take this medicine by mouth with a glass of water. Follow the directions on the prescription label. Take your doses at regular intervals. Do not take it more often than directed. Do not stop taking this medicine suddenly except upon the advice of your doctor. Stopping this medicine too quickly may cause serious side effects or your condition may worsen. A special MedGuide will be given to you by the pharmacist with each prescription and refill. Be sure to read this information carefully each time. Talk to your pediatrician regarding the use of this medicine in children. Special care may be needed. Overdosage: If you think you have taken too much of this medicine contact a poison control center or emergency room at once. NOTE: This medicine is only for you. Do not share this medicine with others. What if I miss a dose? If you miss a dose, take it as soon as you can. If it is almost time for your next dose, take only that dose. Do not take double or extra doses. What may interact with this medicine? Do not take this medicine with any of the  following medications: -arsenic trioxide -certain medicines medicines for irregular heart beat -cisapride -halofantrine -linezolid -MAOIs like Carbex, Eldepryl, Marplan, Nardil, and Parnate -methylene blue (injected into a vein) -other medicines for mental depression -phenothiazines like perphenazine, thioridazine and chlorpromazine -pimozide -probucol -procarbazine -sparfloxacin -St. John's Wort -ziprasidone This medicine may also interact with any of the following medications: -atropine and related drugs like hyoscyamine, scopolamine, tolterodine and others -barbiturate medicines for inducing sleep or treating seizures, such as phenobarbital -cimetidine -medicines for diabetes -medicines for seizures like carbamazepine or phenytoin -reserpine -thyroid medicine This list may not describe all possible interactions. Give your health care provider a list of all the medicines, herbs, non-prescription drugs, or dietary supplements you use. Also tell them if you smoke, drink alcohol, or use illegal drugs. Some items may interact with your medicine. What should I watch for while using this medicine? Tell your doctor if your symptoms do not get better or if they get worse. Visit your doctor or health care professional for regular checks on your progress. Because it may take several weeks to see the full effects of this medicine, it is important to continue your treatment as prescribed by your doctor. Patients and their families should watch out for new or worsening thoughts of suicide or depression. Also watch out for sudden changes in feelings such as feeling anxious, agitated, panicky, irritable, hostile, aggressive, impulsive, severely restless, overly excited and hyperactive, or not being able to sleep. If this happens, especially at the beginning of treatment or after a change in dose, call your health care professional. You may get drowsy or dizzy. Do not drive,   use machinery, or do  anything that needs mental alertness until you know how this medicine affects you. Do not stand or sit up quickly, especially if you are an older patient. This reduces the risk of dizzy or fainting spells. Alcohol may interfere with the effect of this medicine. Avoid alcoholic drinks. Do not treat yourself for coughs, colds, or allergies without asking your doctor or health care professional for advice. Some ingredients can increase possible side effects. Your mouth may get dry. Chewing sugarless gum or sucking hard candy, and drinking plenty of water may help. Contact your doctor if the problem does not go away or is severe. This medicine may cause dry eyes and blurred vision. If you wear contact lenses you may feel some discomfort. Lubricating drops may help. See your eye doctor if the problem does not go away or is severe. This medicine can cause constipation. Try to have a bowel movement at least every 2 to 3 days. If you do not have a bowel movement for 3 days, call your doctor or health care professional. This medicine can make you more sensitive to the sun. Keep out of the sun. If you cannot avoid being in the sun, wear protective clothing and use sunscreen. Do not use sun lamps or tanning beds/booths. What side effects may I notice from receiving this medicine? Side effects that you should report to your doctor or health care professional as soon as possible: -allergic reactions like skin rash, itching or hives, swelling of the face, lips, or tongue -abnormal production of milk in females -breast enlargement in both males and females -breathing problems -confusion, hallucinations -fever with increased sweating -irregular or fast, pounding heartbeat -muscle stiffness, or spasms -pain or difficulty passing urine, loss of bladder control -seizures -suicidal thoughts or other mood changes -swelling of the testicles -tingling, pain, or numbness in the feet or hands -yellowing of the eyes or  skin Side effects that usually do not require medical attention (report to your doctor or health care professional if they continue or are bothersome): -change in sex drive or performance -diarrhea -nausea, vomiting -weight gain or loss This list may not describe all possible side effects. Call your doctor for medical advice about side effects. You may report side effects to FDA at 1-800-FDA-1088. Where should I keep my medicine? Keep out of the reach of children. Store at room temperature between 15 and 30 degrees C (59 and 86 degrees F). Keep container tightly closed. Throw away any unused medicine after the expiration date. NOTE: This sheet is a summary. It may not cover all possible information. If you have questions about this medicine, talk to your doctor, pharmacist, or health care provider.  2014, [employer] Standard. (2011-12-07 13:57:12)

## 2013-12-12 NOTE — Progress Notes (Signed)
Subjective:    Patient ID: Elizabeth Torres, female    DOB: 1976/03/22, 38 y.o.   MRN: [id number] This is a 38 year old female with greater  than 2-year history of lumbar pain and lower extremity pain. She  underwent conservative care including physical therapy and medications  including narcotic analgesics, nonnarcotic, analgesics, NSAIDS, and  Neurontin prior to undergoing a left L5-S1 laminectomy and diskectomy  for herniated nucleus pulposus on Dec 13, 2009. She had a recurrent  disk at that same level and underwent an L5-S1 fusion on January 07, 2010.  Interval medical history positive for diagnosis of right breast  carcinoma. She has undergone lumpectomy as well as a partial mastectomy  on July 26, 2011. She had 16 chemotherapy treatments  , followed by radiation therapy 5 days per week  for 7 wks.  Completed radiation sept 25 2013   HPI Twisted R ankle at home  Pain Inventory Average Pain 6 Pain Right Now 6 My pain is constant, dull and aching  In the last 24 hours, has pain interfered with the following? General activity 4 Relation with others 3 Enjoyment of life 4 What TIME of day is your pain at its worst? evening Sleep (in general) Fair  Pain is worse with: standing Pain improves with: rest, heat/ice and medication Relief from Meds: 6  Mobility walk without assistance  Function Do you have any goals in this area?  no  Neuro/Psych No problems in this area  Prior Studies Any changes since last visit?  no  Physicians involved in your care Any changes since last visit?  no   Family History  Problem Relation Age of Onset  . Diabetes Mother   . Hypertension Mother   . Cancer Paternal Uncle    History   Social History  . Marital Status: Married    Spouse Name: N/A    Number of Children: N/A  . Years of Education: N/A   Social History Main Topics  . Smoking status: Current Every Day Smoker -- 1.00 packs/day    Types: Cigarettes  . Smokeless  tobacco: Never Used  . Alcohol Use: No  . Drug Use: None  . Sexual Activity: None   Other Topics Concern  . None   Social History Narrative  . None   Past Surgical History  Procedure Laterality Date  . Spine surgery  2008    Dr Joya Salm  . Mastectomy Right     partial with lymph node dissection  . Breast surgery     Past Medical History  Diagnosis Date  . Cancer    BP 120/77  Pulse 82  Resp 14  Ht 5\' 3"  (1.6 m)  Wt 163 lb 6.4 oz (74.118 kg)  BMI 28.95 kg/m2  SpO2 99%  Opioid Risk Score:   Fall Risk Score: Low Fall Risk (0-5 points) (educated and handout on fall prevention in the home given at previous visit) Review of Systems  Musculoskeletal: Positive for back pain.  All other systems reviewed and are negative.      Objective:   Physical Exam  Musculoskeletal:       Lumbar back: Normal.  Neurological: A sensory deficit is present. She exhibits abnormal muscle tone. Gait normal.  Reflex Scores:      Patellar reflexes are 0 on the right side and 0 on the left side.      Achilles reflexes are 0 on the right side and 0 on the left side. Reduced pp sensation bilateral toes  intact dorsal midfoot          Assessment & Plan:  1. Lumbar postlaminectomy syndrome status post L5-S1 fusion with chronic S1 radiculopathy now has superimposed chemotherapy-induced polyneuropathy affecting plantar surface of both.  2. Coccydynia chronic  3.  Twisted ankle at work limited walking Recommend lyrica 75mg  twice a day, , no benefit from gabapentin and it caused lethargy ,Lyrica not covered  By insurance will try nortriptyline Continue hydrocodone 10/325 4 times a day  M.D. visit one month may need to increase nortriptyline

## 2014-01-11 ENCOUNTER — Other Ambulatory Visit: Payer: Self-pay | Admitting: Physical Medicine & Rehabilitation

## 2014-01-19 ENCOUNTER — Ambulatory Visit: Payer: 59 | Admitting: Physical Medicine & Rehabilitation

## 2014-01-22 ENCOUNTER — Encounter: Payer: Self-pay | Admitting: Registered Nurse

## 2014-01-22 ENCOUNTER — Encounter: Payer: 59 | Attending: Physical Medicine and Rehabilitation | Admitting: Registered Nurse

## 2014-01-22 VITALS — BP 126/75 | HR 97 | Resp 14 | Ht 63.0 in | Wt 163.0 lb

## 2014-01-22 DIAGNOSIS — M961 Postlaminectomy syndrome, not elsewhere classified: Secondary | ICD-10-CM | POA: Insufficient documentation

## 2014-01-22 DIAGNOSIS — Z5189 Encounter for other specified aftercare: Secondary | ICD-10-CM | POA: Insufficient documentation

## 2014-01-22 DIAGNOSIS — Z79899 Other long term (current) drug therapy: Secondary | ICD-10-CM | POA: Insufficient documentation

## 2014-01-22 DIAGNOSIS — T451X5A Adverse effect of antineoplastic and immunosuppressive drugs, initial encounter: Secondary | ICD-10-CM | POA: Insufficient documentation

## 2014-01-22 DIAGNOSIS — G620 Drug-induced polyneuropathy: Secondary | ICD-10-CM | POA: Insufficient documentation

## 2014-01-22 DIAGNOSIS — Z5181 Encounter for therapeutic drug level monitoring: Secondary | ICD-10-CM | POA: Insufficient documentation

## 2014-01-22 MED ORDER — HYDROCODONE-ACETAMINOPHEN 10-325 MG PO TABS: ORAL_TABLET | ORAL | Status: DC

## 2014-01-22 NOTE — Progress Notes (Signed)
Subjective:    Patient ID: Elizabeth Torres, female    DOB: 1976/06/12, 38 y.o.   MRN: [id number]  HPI: Elizabeth Torres is a 38 year old female who returns for follow up for chronic pain and medication refill. She rates her pain 5. Her current exercise regime is walking.  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 Pain Inventory Average Pain 6 Pain Right Now 5 My pain is constant, dull and aching  In the last 24 hours, has pain interfered with the following? General activity 4 Relation with others 4 Enjoyment of life 4 What TIME of day is your pain at its worst? evening Sleep (in general) Fair  Pain is worse with: sitting, inactivity and standing Pain improves with: rest and medication Relief from Meds: 6  Mobility walk without assistance transfers alone Do you have any goals in this area?  no  Function Do you have any goals in this area?  no  Neuro/Psych No problems in this area  Prior Studies Any changes since last visit?  no  Physicians involved in your care Any changes since last visit?  no   Family History  Problem Relation Age of Onset  . Diabetes Mother   . Hypertension Mother   . Cancer Paternal Uncle    History   Social History  . Marital Status: Married    Spouse Name: N/A    Number of Children: N/A  . Years of Education: N/A   Social History Main Topics  . Smoking status: Current Every Day Smoker -- 1.00 packs/day    Types: Cigarettes  . Smokeless tobacco: Never Used  . Alcohol Use: No  . Drug Use: None  . Sexual Activity: None   Other Topics Concern  . None   Social History Narrative  . None   Past Surgical History  Procedure Laterality Date  . Spine surgery  2008    Dr Joya Salm  . Mastectomy Right     partial with lymph node dissection  . Breast surgery     Past Medical History  Diagnosis Date  . Cancer    BP 126/75  Pulse 97  Resp 14  Ht 5\' 3"  (1.6 m)  Wt 163 lb (73.936 kg)  BMI 28.88 kg/m2   SpO2 97%  Opioid Risk Score:   Fall Risk Score: Moderate Fall Risk (6-13 points) (pt educated onf all risk, brochure given to pt previously)    Review of Systems  Musculoskeletal: Positive for back pain.  All other systems reviewed and are negative.      Objective:   Physical Exam  Nursing note and vitals reviewed. Constitutional: She is oriented to person, place, and time. She appears well-developed and well-nourished.  HENT:  Head: Normocephalic and atraumatic.  Neck: Normal range of motion. Neck supple.  Cardiovascular: Normal rate and regular rhythm.   Pulmonary/Chest: Effort normal and breath sounds normal.  Musculoskeletal:  Normal Muscle Bulk and Muscle Testing Reveals: Upper and Lower Extremities: Full ROM and Muscle Strength 5/5 Lumbar Paraspinal Tenderness: L-3- L-5 Arises from chair with ease Narrow based Gait  Neurological: She is alert and oriented to person, place, and time.  Skin: Skin is warm and dry.  Psychiatric: She has a normal mood and affect.          Assessment & Plan:  1. Lumbar postlaminectomy syndrome status post L5-S1 fusion with chronic S1 radiculopathy  Refilled: Hydrocodone 10/325 mg #120--one every 6 hours as needed  for pain.  2. 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 Letta Pate started Pamelor 10 mg HS. She says she noticed relief with the medication.  20 minutes of face to face time was spent during this visit. All questions were encouraged and answered.   F/U in 48month

## 2014-02-05 ENCOUNTER — Other Ambulatory Visit: Payer: Self-pay | Admitting: Physical Medicine & Rehabilitation

## 2014-02-19 ENCOUNTER — Encounter: Payer: Self-pay | Admitting: Registered Nurse

## 2014-02-19 ENCOUNTER — Encounter: Payer: 59 | Attending: Physical Medicine and Rehabilitation | Admitting: Registered Nurse

## 2014-02-19 VITALS — BP 125/77 | HR 98 | Resp 14 | Ht 63.0 in | Wt 164.0 lb

## 2014-02-19 DIAGNOSIS — G620 Drug-induced polyneuropathy: Secondary | ICD-10-CM | POA: Diagnosis present

## 2014-02-19 DIAGNOSIS — Z79899 Other long term (current) drug therapy: Secondary | ICD-10-CM | POA: Insufficient documentation

## 2014-02-19 DIAGNOSIS — Z5189 Encounter for other specified aftercare: Secondary | ICD-10-CM | POA: Diagnosis not present

## 2014-02-19 DIAGNOSIS — Z5181 Encounter for therapeutic drug level monitoring: Secondary | ICD-10-CM

## 2014-02-19 DIAGNOSIS — T451X5A Adverse effect of antineoplastic and immunosuppressive drugs, initial encounter: Secondary | ICD-10-CM | POA: Diagnosis not present

## 2014-02-19 DIAGNOSIS — M961 Postlaminectomy syndrome, not elsewhere classified: Secondary | ICD-10-CM | POA: Insufficient documentation

## 2014-02-19 MED ORDER — HYDROCODONE-ACETAMINOPHEN 10-325 MG PO TABS: ORAL_TABLET | ORAL | Status: DC

## 2014-02-19 NOTE — Progress Notes (Signed)
Subjective:    Patient ID: Elizabeth Torres, female    DOB: 1975/08/12, 38 y.o.   MRN: [id number]  HPI: [name] is a 38 year old female who returns for follow up for chronic pain and medication refill. She says her pain is located in her lower back and bilateral legs. She rates her pain 5. Her current exercise regime is walking while at work. She is working 50-55 hours a week. No complaints voiced during the visit, she states has to be at 9. Has a 30- 35 minute drive to get their.  Pain Inventory Average Pain 6 Pain Right Now 5 My pain is constant, dull and aching  In the last 24 hours, has pain interfered with the following? General activity 4 Relation with others 3 Enjoyment of life 3 What TIME of day is your pain at its worst? evening Sleep (in general) Fair  Pain is worse with: bending, inactivity and standing Pain improves with: rest and medication Relief from Meds: 5  Mobility Do you have any goals in this area?  no  Function Do you have any goals in this area?  no  Neuro/Psych No problems in this area  Prior Studies Any changes since last visit?  no  Physicians involved in your care Any changes since last visit?  no   Family History  Problem Relation Age of Onset  . Diabetes Mother   . Hypertension Mother   . Cancer Paternal Uncle    History   Social History  . Marital Status: Married    Spouse Name: N/A    Number of Children: N/A  . Years of Education: N/A   Social History Main Topics  . Smoking status: Current Every Day Smoker -- 1.00 packs/day    Types: Cigarettes  . Smokeless tobacco: Never Used  . Alcohol Use: No  . Drug Use: None  . Sexual Activity: None   Other Topics Concern  . None   Social History Narrative  . None   Past Surgical History  Procedure Laterality Date  . Spine surgery  2008    Dr Joya Salm  . Mastectomy Right     partial with lymph node dissection  . Breast surgery     Past Medical History  Diagnosis Date   . Cancer    BP 125/77  Pulse 98  Resp 14  Ht 5\' 3"  (1.6 m)  Wt 164 lb (74.39 kg)  BMI 29.06 kg/m2  SpO2 98%  Opioid Risk Score:   Fall Risk Score: Low Fall Risk (0-5 points) (patient educated handout declined)   Review of Systems  Musculoskeletal: Positive for back pain.  All other systems reviewed and are negative.      Objective:   Physical Exam  Nursing note and vitals reviewed. Constitutional: She is oriented to person, place, and time. She appears well-developed and well-nourished.  HENT:  Head: Normocephalic and atraumatic.  Neck: Normal range of motion. Neck supple.  Cardiovascular: Normal rate, regular rhythm and normal heart sounds.   Pulmonary/Chest: Effort normal and breath sounds normal.  Musculoskeletal:  Normal Muscle Bulk and Muscle testing Reveals: Upper Extremities: Full ROM and Muscle Strength 5/5 Back without spinal or paraspinal tenderness Lower Extremities: Full ROM and Muscle Strength 5/5 Arises from chair with ease Narrow based Gait   Neurological: She is alert and oriented to person, place, and time.  Skin: Skin is warm and dry.  Psychiatric: She has a normal mood and affect.  Assessment & Plan:  1. Lumbar postlaminectomy syndrome status post L5-S1 fusion with chronic S1 radiculopathy  Refilled: Hydrocodone 10/325 mg #120--one every 6 hours as needed for pain.  2. 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   face to face time was spent during this visit. All questions were encouraged and answered.   F/U in 43month

## 2014-02-20 ENCOUNTER — Ambulatory Visit: Payer: 59 | Admitting: Registered Nurse

## 2014-03-07 ENCOUNTER — Other Ambulatory Visit: Payer: Self-pay | Admitting: Physical Medicine & Rehabilitation

## 2014-03-21 ENCOUNTER — Encounter: Payer: Self-pay | Admitting: Registered Nurse

## 2014-03-21 ENCOUNTER — Encounter: Payer: 59 | Attending: Physical Medicine and Rehabilitation | Admitting: Registered Nurse

## 2014-03-21 VITALS — BP 126/81 | HR 83 | Resp 14 | Wt 166.8 lb

## 2014-03-21 DIAGNOSIS — M961 Postlaminectomy syndrome, not elsewhere classified: Secondary | ICD-10-CM | POA: Insufficient documentation

## 2014-03-21 DIAGNOSIS — Z5181 Encounter for therapeutic drug level monitoring: Secondary | ICD-10-CM | POA: Insufficient documentation

## 2014-03-21 DIAGNOSIS — Z5189 Encounter for other specified aftercare: Secondary | ICD-10-CM | POA: Insufficient documentation

## 2014-03-21 DIAGNOSIS — T451X5A Adverse effect of antineoplastic and immunosuppressive drugs, initial encounter: Secondary | ICD-10-CM | POA: Diagnosis not present

## 2014-03-21 DIAGNOSIS — G620 Drug-induced polyneuropathy: Secondary | ICD-10-CM | POA: Insufficient documentation

## 2014-03-21 DIAGNOSIS — Z79899 Other long term (current) drug therapy: Secondary | ICD-10-CM

## 2014-03-21 MED ORDER — HYDROCODONE-ACETAMINOPHEN 10-325 MG PO TABS: ORAL_TABLET | ORAL | Status: DC

## 2014-03-21 NOTE — Progress Notes (Signed)
Subjective:    Patient ID: Elizabeth Torres, female    DOB: 30-Apr-1976, 38 y.o.   MRN: [id number]  HPI: [name] is a 38 year old female who returns for follow up for chronic pain and medication refill. She says her pain is located in her lower back and bilateral legs. She rates her pain 6. She hasn't been able to follow her usual exercise regime due to a sprain ankle she says.  Pain Inventory Average Pain 5 Pain Right Now 6 My pain is dull  In the last 24 hours, has pain interfered with the following? General activity 6 Relation with others 3 Enjoyment of life 3 What TIME of day is your pain at its worst? evening Sleep (in general) Fair  Pain is worse with: bending, sitting and standing Pain improves with: rest and medication Relief from Meds: 6  Mobility walk without assistance Do you have any goals in this area?  no  Function Do you have any goals in this area?  no  Neuro/Psych No problems in this area  Prior Studies Any changes since last visit?  no  Physicians involved in your care Any changes since last visit?  no   Family History  Problem Relation Age of Onset  . Diabetes Mother   . Hypertension Mother   . Cancer Paternal Uncle    History   Social History  . Marital Status: Married    Spouse Name: N/A    Number of Children: N/A  . Years of Education: N/A   Social History Main Topics  . Smoking status: Current Every Day Smoker -- 1.00 packs/day    Types: Cigarettes  . Smokeless tobacco: Never Used  . Alcohol Use: No  . Drug Use: None  . Sexual Activity: None   Other Topics Concern  . None   Social History Narrative  . None   Past Surgical History  Procedure Laterality Date  . Spine surgery  2008    Dr Joya Salm  . Mastectomy Right     partial with lymph node dissection  . Breast surgery     Past Medical History  Diagnosis Date  . Cancer    BP 126/81  Pulse 83  Resp 14  Wt 166 lb 12.8 oz (75.66 kg)  SpO2 98%  Opioid Risk  Score:   Fall Risk Score: Low Fall Risk (0-5 points) (previously educated and given handout) Review of Systems  Musculoskeletal: Positive for back pain.  All other systems reviewed and are negative.      Objective:   Physical Exam  Nursing note and vitals reviewed. Constitutional: She is oriented to person, place, and time. She appears well-developed and well-nourished.  HENT:  Head: Normocephalic and atraumatic.  Neck: Normal range of motion. Neck supple.  Cardiovascular: Normal rate and regular rhythm.   Pulmonary/Chest: Effort normal and breath sounds normal.  Musculoskeletal:  Normal Muscle Bulk and Muscle testing Reveals: Upper Extremities: Full ROM and Muscle Strength 5/5 Spinal Forward Flexion 45 Degrees and Extension 20 Degrees Lower Extremities: Full ROM and Muscle Strength 5/5 Left Leg Flexion Produces Pain into Left Hip Arises from chair with ease Narrow Based gait  Neurological: She is alert and oriented to person, place, and time.  Skin: Skin is warm and dry.  Psychiatric: She has a normal mood and affect.          Assessment & Plan:  1. Lumbar postlaminectomy syndrome status post L5-S1 fusion with chronic S1 radiculopathy  Refilled:  Hydrocodone 10/325 mg #120--one every 6 hours as needed for pain.  2. 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  15 minutes of face to face time was spent during this visit. All questions were encouraged and answered.   F/U in 6month

## 2014-04-06 ENCOUNTER — Other Ambulatory Visit: Payer: Self-pay | Admitting: Physical Medicine & Rehabilitation

## 2014-04-18 ENCOUNTER — Encounter: Payer: 59 | Attending: Physical Medicine and Rehabilitation | Admitting: Registered Nurse

## 2014-04-18 DIAGNOSIS — G620 Drug-induced polyneuropathy: Secondary | ICD-10-CM | POA: Insufficient documentation

## 2014-04-18 DIAGNOSIS — Z79899 Other long term (current) drug therapy: Secondary | ICD-10-CM | POA: Insufficient documentation

## 2014-04-18 DIAGNOSIS — Z5189 Encounter for other specified aftercare: Secondary | ICD-10-CM | POA: Insufficient documentation

## 2014-04-18 DIAGNOSIS — Z5181 Encounter for therapeutic drug level monitoring: Secondary | ICD-10-CM | POA: Insufficient documentation

## 2014-04-18 DIAGNOSIS — M961 Postlaminectomy syndrome, not elsewhere classified: Secondary | ICD-10-CM | POA: Insufficient documentation

## 2014-05-07 ENCOUNTER — Other Ambulatory Visit: Payer: Self-pay | Admitting: Physical Medicine & Rehabilitation

## 2014-05-08 ENCOUNTER — Other Ambulatory Visit: Payer: Self-pay | Admitting: Physical Medicine & Rehabilitation

## 2014-05-11 ENCOUNTER — Encounter: Payer: Self-pay | Admitting: Registered Nurse

## 2014-05-11 ENCOUNTER — Encounter: Payer: 59 | Attending: Physical Medicine and Rehabilitation | Admitting: Registered Nurse

## 2014-05-11 VITALS — BP 127/84 | HR 86 | Resp 14 | Ht 63.0 in | Wt 165.0 lb

## 2014-05-11 DIAGNOSIS — F17210 Nicotine dependence, cigarettes, uncomplicated: Secondary | ICD-10-CM | POA: Insufficient documentation

## 2014-05-11 DIAGNOSIS — Z760 Encounter for issue of repeat prescription: Secondary | ICD-10-CM | POA: Insufficient documentation

## 2014-05-11 DIAGNOSIS — T451X5S Adverse effect of antineoplastic and immunosuppressive drugs, sequela: Secondary | ICD-10-CM | POA: Insufficient documentation

## 2014-05-11 DIAGNOSIS — M79605 Pain in left leg: Secondary | ICD-10-CM | POA: Diagnosis not present

## 2014-05-11 DIAGNOSIS — Z9221 Personal history of antineoplastic chemotherapy: Secondary | ICD-10-CM | POA: Insufficient documentation

## 2014-05-11 DIAGNOSIS — G620 Drug-induced polyneuropathy: Secondary | ICD-10-CM | POA: Diagnosis not present

## 2014-05-11 DIAGNOSIS — M961 Postlaminectomy syndrome, not elsewhere classified: Secondary | ICD-10-CM | POA: Diagnosis not present

## 2014-05-11 DIAGNOSIS — G8929 Other chronic pain: Secondary | ICD-10-CM | POA: Insufficient documentation

## 2014-05-11 DIAGNOSIS — M545 Low back pain: Secondary | ICD-10-CM | POA: Diagnosis not present

## 2014-05-11 DIAGNOSIS — Z5181 Encounter for therapeutic drug level monitoring: Secondary | ICD-10-CM

## 2014-05-11 DIAGNOSIS — T451X5A Adverse effect of antineoplastic and immunosuppressive drugs, initial encounter: Secondary | ICD-10-CM

## 2014-05-11 DIAGNOSIS — M79604 Pain in right leg: Secondary | ICD-10-CM | POA: Insufficient documentation

## 2014-05-11 DIAGNOSIS — Z79899 Other long term (current) drug therapy: Secondary | ICD-10-CM

## 2014-05-11 MED ORDER — ZOLPIDEM TARTRATE 5 MG PO TABS: ORAL_TABLET | ORAL | Status: DC

## 2014-05-11 MED ORDER — HYDROCODONE-ACETAMINOPHEN 10-325 MG PO TABS: ORAL_TABLET | ORAL | Status: DC

## 2014-05-11 NOTE — Progress Notes (Signed)
Subjective:    Patient ID: Elizabeth Torres, female    DOB: 11-24-1975, 38 y.o.   MRN: [id number]  HPI: [name] is a 38 year old female who returns for follow up for chronic pain and medication refill. She says her pain is located in her lower back and bilateral legs. She rates her pain 8. She hasn't  followed her usual exercise regime. She's walking at her place of employment. She's working 40 hours a week.   Pain Inventory Average Pain 8 Pain Right Now 8 My pain is constant and aching  In the last 24 hours, has pain interfered with the following? General activity 7 Relation with others 7 Enjoyment of life 7 What TIME of day is your pain at its worst? daytime Sleep (in general) Poor  Pain is worse with: inactivity Pain improves with: heat/ice and medication Relief from Meds: 3  Mobility walk without assistance  Function Employed 40 hours per week  Neuro/Psych No problems in this area  Prior Studies Any changes since last visit?  no  Physicians involved in your care Any changes since last visit?  no   Family History  Problem Relation Age of Onset  . Diabetes Mother   . Hypertension Mother   . Cancer Paternal Uncle    History   Social History  . Marital Status: Married    Spouse Name: N/A    Number of Children: N/A  . Years of Education: N/A   Social History Main Topics  . Smoking status: Current Every Day Smoker -- 1.00 packs/day    Types: Cigarettes  . Smokeless tobacco: Never Used  . Alcohol Use: No  . Drug Use: Not on file  . Sexual Activity: Not on file   Other Topics Concern  . Not on file   Social History Narrative  . No narrative on file   Past Surgical History  Procedure Laterality Date  . Spine surgery  2008    Dr Joya Salm  . Mastectomy Right     partial with lymph node dissection  . Breast surgery     Past Medical History  Diagnosis Date  . Cancer    BP 127/84  Pulse 86  Resp 14  Ht 5\' 3"  (1.6 m)  Wt 165 lb (74.844  kg)  BMI 29.24 kg/m2  SpO2 94%  Opioid Risk Score:   Fall Risk Score: Low Fall Risk (0-5 points)   Review of Systems     Objective:   Physical Exam  Nursing note and vitals reviewed. Constitutional: She is oriented to person, place, and time. She appears well-developed and well-nourished.  HENT:  Head: Normocephalic and atraumatic.  Neck: Normal range of motion. Neck supple.  Cardiovascular: Normal rate and regular rhythm.   Pulmonary/Chest: Effort normal and breath sounds normal.  Musculoskeletal:  Normal Muscle Bulk and Muscle testing Reveals: Upper extremities: Full ROM and Muscle Strength 5/5 Spinal Forward Flexion 45 Degrees and Extension 20 Degrees Lumbar Paraspinal tenderness: L-2- L-5 Lower Extremities: Full ROM and Muscle strength 5/5 Arises from chair with ease Narrow based Gait  Neurological: She is alert and oriented to person, place, and time.  Skin: Skin is warm and dry.  Psychiatric: She has a normal mood and affect.          Assessment & Plan:  1. Lumbar postlaminectomy syndrome status post L5-S1 fusion with chronic S1 radiculopathy  Refilled: Hydrocodone 10/325 mg #120--one every 6 hours as needed for pain.  2. Chemotherapy-induced polyneuropathy  affecting plantar surface of both feet: Continue Pamelor 10 mg HS.  15 minutes of face to face time was spent during this visit. All questions were encouraged and answered.  F/U in 51month

## 2014-05-28 LAB — LIPID PANEL
Cholesterol: 172 mg/dL (ref 0–200)
HDL: 67 mg/dL (ref 35–70)
LDL Cholesterol: 90 mg/dL
Triglycerides: 75 mg/dL (ref 40–160)

## 2014-05-28 LAB — HM PAP SMEAR: HM Pap smear: NORMAL

## 2014-05-28 LAB — FECAL OCCULT BLOOD, GUAIAC: FECAL OCCULT BLD: NEGATIVE

## 2014-05-28 LAB — BASIC METABOLIC PANEL
BUN: 13 mg/dL (ref 4–21)
Creatinine: 0.8 mg/dL (ref <=1.1)
Glucose: 80 mg/dL

## 2014-06-01 ENCOUNTER — Ambulatory Visit: Payer: Self-pay | Admitting: Oncology

## 2014-06-07 ENCOUNTER — Encounter: Payer: 59 | Admitting: Registered Nurse

## 2014-06-12 ENCOUNTER — Other Ambulatory Visit: Payer: Self-pay | Admitting: Physical Medicine & Rehabilitation

## 2014-06-13 ENCOUNTER — Encounter: Payer: 59 | Attending: Physical Medicine and Rehabilitation | Admitting: Registered Nurse

## 2014-06-13 ENCOUNTER — Encounter: Payer: Self-pay | Admitting: Registered Nurse

## 2014-06-13 VITALS — BP 130/85 | HR 72 | Resp 14 | Ht 63.0 in | Wt 168.0 lb

## 2014-06-13 DIAGNOSIS — Z5181 Encounter for therapeutic drug level monitoring: Secondary | ICD-10-CM

## 2014-06-13 DIAGNOSIS — G620 Drug-induced polyneuropathy: Secondary | ICD-10-CM | POA: Diagnosis not present

## 2014-06-13 DIAGNOSIS — M961 Postlaminectomy syndrome, not elsewhere classified: Secondary | ICD-10-CM | POA: Diagnosis not present

## 2014-06-13 DIAGNOSIS — Z79899 Other long term (current) drug therapy: Secondary | ICD-10-CM

## 2014-06-13 DIAGNOSIS — M79604 Pain in right leg: Secondary | ICD-10-CM | POA: Diagnosis not present

## 2014-06-13 DIAGNOSIS — M79605 Pain in left leg: Secondary | ICD-10-CM | POA: Insufficient documentation

## 2014-06-13 DIAGNOSIS — G8929 Other chronic pain: Secondary | ICD-10-CM | POA: Diagnosis not present

## 2014-06-13 DIAGNOSIS — M545 Low back pain: Secondary | ICD-10-CM | POA: Insufficient documentation

## 2014-06-13 DIAGNOSIS — T451X5S Adverse effect of antineoplastic and immunosuppressive drugs, sequela: Secondary | ICD-10-CM | POA: Diagnosis not present

## 2014-06-13 DIAGNOSIS — Z9221 Personal history of antineoplastic chemotherapy: Secondary | ICD-10-CM | POA: Insufficient documentation

## 2014-06-13 DIAGNOSIS — Z760 Encounter for issue of repeat prescription: Secondary | ICD-10-CM | POA: Insufficient documentation

## 2014-06-13 DIAGNOSIS — F17210 Nicotine dependence, cigarettes, uncomplicated: Secondary | ICD-10-CM | POA: Insufficient documentation

## 2014-06-13 DIAGNOSIS — T451X5A Adverse effect of antineoplastic and immunosuppressive drugs, initial encounter: Secondary | ICD-10-CM

## 2014-06-13 MED ORDER — HYDROCODONE-ACETAMINOPHEN 10-325 MG PO TABS: ORAL_TABLET | ORAL | Status: DC

## 2014-06-13 NOTE — Progress Notes (Signed)
Subjective:    Patient ID: Elizabeth Torres, female    DOB: 1976/07/11, 38 y.o.   MRN: [id number]  HPI: Elizabeth Torres is a 38 year old female who returns for follow up for chronic pain and medication refill. She says her pain is located in her lower back. She rates her pain 5. She hasn't followed her usual exercise regime. She's walking at her place of employment. She's working 40 hours a week.  Pain Inventory Average Pain 4 Pain Right Now 5 My pain is constant, dull and aching  In the last 24 hours, has pain interfered with the following? General activity 4 Relation with others 4 Enjoyment of life 4 What TIME of day is your pain at its worst? evening Sleep (in general) Fair  Pain is worse with: inactivity and standing Pain improves with: heat/ice and medication Relief from Meds: 6  Mobility walk without assistance how many minutes can you walk? 10-15 ability to climb steps?  yes do you drive?  yes Do you have any goals in this area?  no  Function employed # of hrs/week 40 hrs/week Do you have any goals in this area?  no  Neuro/Psych No problems in this area  Prior Studies Any changes since last visit?  no  Physicians involved in your care Any changes since last visit?  no   Family History  Problem Relation Age of Onset  . Diabetes Mother   . Hypertension Mother   . Cancer Paternal Uncle    History   Social History  . Marital Status: Married    Spouse Name: N/A    Number of Children: N/A  . Years of Education: N/A   Social History Main Topics  . Smoking status: Current Every Day Smoker -- 1.00 packs/day    Types: Cigarettes  . Smokeless tobacco: Never Used  . Alcohol Use: No  . Drug Use: None  . Sexual Activity: None   Other Topics Concern  . None   Social History Narrative   Past Surgical History  Procedure Laterality Date  . Spine surgery  2008    Dr Joya Salm  . Mastectomy Right     partial with lymph node dissection  . Breast surgery       Past Medical History  Diagnosis Date  . Cancer    BP 130/85 mmHg  Pulse 72  Resp 14  Ht 5\' 3"  (1.6 m)  Wt 168 lb (76.204 kg)  BMI 29.77 kg/m2  SpO2 97%  Opioid Risk Score:   Fall Risk Score: Low Fall Risk (0-5 points) Review of Systems  Musculoskeletal: Positive for back pain.  All other systems reviewed and are negative.      Objective:   Physical Exam  Constitutional: She is oriented to person, place, and time. She appears well-developed and well-nourished.  HENT:  Head: Normocephalic and atraumatic.  Neck: Normal range of motion.  Cardiovascular: Normal rate and regular rhythm.   Pulmonary/Chest: Effort normal and breath sounds normal.  Musculoskeletal:  Normal Muscle Bulk and Muscle testing Reveals: Upper Extremities: Full ROM and Muscle Strength 5/5 Lumbar Paraspinal Tenderness: L-3- L-5 Lower Extremities: Full ROM and Muscle Strength 5/5 Arises from chair with ease Narrow Based Gait  Neurological: She is alert and oriented to person, place, and time.  Skin: Skin is warm and dry.  Psychiatric: She has a normal mood and affect.  Nursing note and vitals reviewed.         Assessment & Plan:  1. Lumbar postlaminectomy syndrome status post L5-S1 fusion with chronic S1 radiculopathy  Refilled: Hydrocodone 10/325 mg #120--one every 6 hours as needed for pain.  2. 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  15 minutes of face to face time was spent during this visit. All questions were encouraged and answered.  F/U in 80month

## 2014-07-06 ENCOUNTER — Other Ambulatory Visit: Payer: Self-pay | Admitting: Physical Medicine & Rehabilitation

## 2014-07-12 ENCOUNTER — Encounter: Payer: 59 | Attending: Physical Medicine and Rehabilitation | Admitting: Registered Nurse

## 2014-07-12 ENCOUNTER — Encounter: Payer: Self-pay | Admitting: Registered Nurse

## 2014-07-12 VITALS — BP 138/87 | HR 93 | Resp 14

## 2014-07-12 DIAGNOSIS — Z5181 Encounter for therapeutic drug level monitoring: Secondary | ICD-10-CM

## 2014-07-12 DIAGNOSIS — T451X5S Adverse effect of antineoplastic and immunosuppressive drugs, sequela: Secondary | ICD-10-CM | POA: Diagnosis not present

## 2014-07-12 DIAGNOSIS — J0120 Acute ethmoidal sinusitis, unspecified: Secondary | ICD-10-CM

## 2014-07-12 DIAGNOSIS — M545 Low back pain: Secondary | ICD-10-CM | POA: Insufficient documentation

## 2014-07-12 DIAGNOSIS — Z79899 Other long term (current) drug therapy: Secondary | ICD-10-CM

## 2014-07-12 DIAGNOSIS — M79604 Pain in right leg: Secondary | ICD-10-CM | POA: Insufficient documentation

## 2014-07-12 DIAGNOSIS — G620 Drug-induced polyneuropathy: Secondary | ICD-10-CM | POA: Insufficient documentation

## 2014-07-12 DIAGNOSIS — Z760 Encounter for issue of repeat prescription: Secondary | ICD-10-CM | POA: Diagnosis not present

## 2014-07-12 DIAGNOSIS — M79605 Pain in left leg: Secondary | ICD-10-CM | POA: Insufficient documentation

## 2014-07-12 DIAGNOSIS — F17210 Nicotine dependence, cigarettes, uncomplicated: Secondary | ICD-10-CM | POA: Insufficient documentation

## 2014-07-12 DIAGNOSIS — G8929 Other chronic pain: Secondary | ICD-10-CM | POA: Diagnosis not present

## 2014-07-12 DIAGNOSIS — M961 Postlaminectomy syndrome, not elsewhere classified: Secondary | ICD-10-CM | POA: Insufficient documentation

## 2014-07-12 DIAGNOSIS — T451X5A Adverse effect of antineoplastic and immunosuppressive drugs, initial encounter: Secondary | ICD-10-CM

## 2014-07-12 DIAGNOSIS — Z9221 Personal history of antineoplastic chemotherapy: Secondary | ICD-10-CM | POA: Diagnosis not present

## 2014-07-12 MED ORDER — AZITHROMYCIN 250 MG PO TABS: ORAL_TABLET | ORAL | Status: DC

## 2014-07-12 MED ORDER — HYDROCODONE-ACETAMINOPHEN 10-325 MG PO TABS: ORAL_TABLET | ORAL | Status: DC

## 2014-07-12 NOTE — Progress Notes (Signed)
   Subjective:    Patient ID: Elizabeth Torres, female    DOB: 04/17/1976, 38 y.o.   MRN: [id number]  HPI: Ms. ARLENA MARSAN is a 38 year old female who returns for follow up for chronic pain and medication refill. She says her pain is located in her lower back and lower extremities. She rates her pain 4. Her current exercise regime is walking.  She's working 40 hours a week.  Pain Inventory Average Pain 4 Pain Right Now 4 My pain is constant, dull and aching  In the last 24 hours, has pain interfered with the following? General activity 4 Relation with others 3 Enjoyment of life 3 What TIME of day is your pain at its worst? evening Sleep (in general) Fair  Pain is worse with: walking and standing Pain improves with: rest and medication Relief from Meds: 7  Mobility walk without assistance  Function Do you have any goals in this area?  no  Neuro/Psych No problems in this area  Prior Studies Any changes since last visit?  no  Physicians involved in your care Any changes since last visit?  no   Family History  Problem Relation Age of Onset  . Diabetes Mother   . Hypertension Mother   . Cancer Paternal Uncle    History   Social History  . Marital Status: Married    Spouse Name: N/A    Number of Children: N/A  . Years of Education: N/A   Social History Main Topics  . Smoking status: Current Every Day Smoker -- 1.00 packs/day    Types: Cigarettes  . Smokeless tobacco: Never Used  . Alcohol Use: No  . Drug Use: None  . Sexual Activity: None   Other Topics Concern  . None   Social History Narrative   Past Surgical History  Procedure Laterality Date  . Spine surgery  2008    Dr Joya Salm  . Mastectomy Right     partial with lymph node dissection  . Breast surgery     Past Medical History  Diagnosis Date  . Cancer    BP 138/87 mmHg  Pulse 93  Resp 14  SpO2 97%  Opioid Risk Score:   Fall Risk Score:   Review of Systems  All other systems reviewed  and are negative.      Objective:   Physical Exam  Constitutional: She appears well-developed and well-nourished.  HENT:  Head: Normocephalic and atraumatic.  Neck: Normal range of motion. Neck supple.  Cardiovascular: Normal rate and regular rhythm.   Pulmonary/Chest: Effort normal and breath sounds normal.  Musculoskeletal:  Normal Muscle Bulk and Muscle testing Reveals: Upper Extremities: Full ROM and Muscle strength 5/5 Back without spinal or Paraspinal Tenderness Lower Extremities: Full ROM and Muscle strength 5/5 Arises from chair with ease Narrow Based gait  Neurological: She is alert.  Skin: Skin is warm and dry.  Psychiatric: She has a normal mood and affect.  Nursing note and vitals reviewed.         Assessment & Plan:  1. Lumbar postlaminectomy syndrome status post L5-S1 fusion with chronic S1 radiculopathy  Refilled: Hydrocodone 10/325 mg #120--one every 6 hours as needed for pain.  2. 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  3. Sinusitis: RX: Zpak 15 minutes of face to face time was spent during this visit. All questions were encouraged and answered.  F/U in 74month

## 2014-08-04 ENCOUNTER — Other Ambulatory Visit: Payer: Self-pay | Admitting: Physical Medicine & Rehabilitation

## 2014-08-07 ENCOUNTER — Other Ambulatory Visit: Payer: Self-pay | Admitting: Physical Medicine & Rehabilitation

## 2014-08-09 ENCOUNTER — Other Ambulatory Visit: Payer: Self-pay | Admitting: Physical Medicine & Rehabilitation

## 2014-08-09 ENCOUNTER — Encounter: Payer: Self-pay | Admitting: Registered Nurse

## 2014-08-09 ENCOUNTER — Encounter: Payer: 59 | Attending: Physical Medicine and Rehabilitation | Admitting: Registered Nurse

## 2014-08-09 VITALS — BP 138/79 | HR 78 | Resp 20

## 2014-08-09 DIAGNOSIS — G894 Chronic pain syndrome: Secondary | ICD-10-CM

## 2014-08-09 DIAGNOSIS — Z9221 Personal history of antineoplastic chemotherapy: Secondary | ICD-10-CM | POA: Diagnosis not present

## 2014-08-09 DIAGNOSIS — Z760 Encounter for issue of repeat prescription: Secondary | ICD-10-CM | POA: Diagnosis not present

## 2014-08-09 DIAGNOSIS — Z79899 Other long term (current) drug therapy: Secondary | ICD-10-CM

## 2014-08-09 DIAGNOSIS — T451X5S Adverse effect of antineoplastic and immunosuppressive drugs, sequela: Secondary | ICD-10-CM | POA: Insufficient documentation

## 2014-08-09 DIAGNOSIS — M79605 Pain in left leg: Secondary | ICD-10-CM | POA: Diagnosis not present

## 2014-08-09 DIAGNOSIS — M79604 Pain in right leg: Secondary | ICD-10-CM | POA: Insufficient documentation

## 2014-08-09 DIAGNOSIS — G620 Drug-induced polyneuropathy: Secondary | ICD-10-CM | POA: Insufficient documentation

## 2014-08-09 DIAGNOSIS — F17210 Nicotine dependence, cigarettes, uncomplicated: Secondary | ICD-10-CM | POA: Diagnosis not present

## 2014-08-09 DIAGNOSIS — M961 Postlaminectomy syndrome, not elsewhere classified: Secondary | ICD-10-CM | POA: Insufficient documentation

## 2014-08-09 DIAGNOSIS — G8929 Other chronic pain: Secondary | ICD-10-CM | POA: Diagnosis not present

## 2014-08-09 DIAGNOSIS — M545 Low back pain: Secondary | ICD-10-CM | POA: Insufficient documentation

## 2014-08-09 DIAGNOSIS — Z5181 Encounter for therapeutic drug level monitoring: Secondary | ICD-10-CM

## 2014-08-09 DIAGNOSIS — T451X5A Adverse effect of antineoplastic and immunosuppressive drugs, initial encounter: Secondary | ICD-10-CM

## 2014-08-09 MED ORDER — HYDROCODONE-ACETAMINOPHEN 10-325 MG PO TABS: ORAL_TABLET | ORAL | Status: DC

## 2014-08-09 NOTE — Progress Notes (Signed)
Subjective:    Patient ID: Elizabeth Torres, female    DOB: 04/17/1976, 39 y.o.   MRN: [id number]  HPI: [name] is a 39 year old female who returns for follow up for chronic pain and medication refill. She says her pain is located in her lower back and lower extremities. She rates her pain 4. Her current exercise regime is walking. She's working 40 hours a week.   Pain Inventory Average Pain 4 Pain Right Now 4 My pain is constant and aching  In the last 24 hours, has pain interfered with the following? General activity 3 Relation with others 3 Enjoyment of life 2 What TIME of day is your pain at its worst? evening Sleep (in general) Fair  Pain is worse with: walking and sitting Pain improves with: rest and medication Relief from Meds: 4  Mobility Do you have any goals in this area?  no  Function Do you have any goals in this area?  no  Neuro/Psych No problems in this area  Prior Studies Any changes since last visit?  no  Physicians involved in your care Any changes since last visit?  no   Family History  Problem Relation Age of Onset  . Diabetes Mother   . Hypertension Mother   . Cancer Paternal Uncle    History   Social History  . Marital Status: Married    Spouse Name: N/A    Number of Children: N/A  . Years of Education: N/A   Social History Main Topics  . Smoking status: Current Every Day Smoker -- 1.00 packs/day    Types: Cigarettes  . Smokeless tobacco: Never Used  . Alcohol Use: No  . Drug Use: None  . Sexual Activity: None   Other Topics Concern  . None   Social History Narrative   Past Surgical History  Procedure Laterality Date  . Spine surgery  2008    Dr Elizabeth Torres  . Mastectomy Right     partial with lymph node dissection  . Breast surgery     Past Medical History  Diagnosis Date  . Cancer    BP 138/79 mmHg  Pulse 78  Resp 20  SpO2 97%  Opioid Risk Score:   Fall Risk Score: Low Fall Risk (0-5 points) Review of  Systems  All other systems reviewed and are negative.      Objective:   Physical Exam  Constitutional: She is oriented to person, place, and time. She appears well-developed and well-nourished.  HENT:  Head: Normocephalic and atraumatic.  Neck: Normal range of motion. Neck supple.  Cardiovascular: Normal rate and regular rhythm.   Pulmonary/Chest: Effort normal and breath sounds normal.  Musculoskeletal:  Normal Muscle Bulk and Muscle Testing Reveals: Upper Extremities: Full ROM and Muscle Strength 5/5 Back without spinal or Paraspinal Tenderness Lower Extremities: Full ROM and Muscle strength 5/5 Arises from chair with ease Narrow Based gait  Neurological: She is alert and oriented to person, place, and time.  Skin: Skin is warm and dry.  Psychiatric: She has a normal mood and affect.  Nursing note and vitals reviewed.         Assessment & Plan:  1. Lumbar postlaminectomy syndrome status post L5-S1 fusion with chronic S1 radiculopathy  Refilled: Hydrocodone 10/325 mg #120--one every 6 hours as needed for pain.  2. 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   15 minutes of face to face time was spent during  this visit. All questions were encouraged and answered.  F/U in 33month

## 2014-08-10 LAB — PMP ALCOHOL METABOLITE (ETG): Ethyl Glucuronide (EtG): NEGATIVE ng/mL

## 2014-08-13 LAB — OPIATES/OPIOIDS (LC/MS-MS)
Codeine Urine: NEGATIVE ng/mL (ref <50)
Hydrocodone: 544 ng/mL (ref <50)
Hydromorphone: NEGATIVE ng/mL — AB (ref <50)
Morphine Urine: NEGATIVE ng/mL (ref <50)
Norhydrocodone, Ur: 616 ng/mL (ref <50)
Noroxycodone, Ur: NEGATIVE ng/mL (ref <50)
Oxycodone, ur: NEGATIVE ng/mL (ref <50)
Oxymorphone: NEGATIVE ng/mL (ref <50)

## 2014-08-14 LAB — PRESCRIPTION MONITORING PROFILE (SOLSTAS)
AMPHETAMINE/METH: NEGATIVE ng/mL
BARBITURATE SCREEN, URINE: NEGATIVE ng/mL
Benzodiazepine Screen, Urine: NEGATIVE ng/mL
Buprenorphine, Urine: NEGATIVE ng/mL
CANNABINOID SCRN UR: NEGATIVE ng/mL
Carisoprodol, Urine: NEGATIVE ng/mL
Cocaine Metabolites: NEGATIVE ng/mL
Creatinine, Urine: 14.11 mg/dL — ABNORMAL LOW (ref >20.0)
Fentanyl, Ur: NEGATIVE ng/mL
MDMA URINE: NEGATIVE ng/mL
Meperidine, Ur: NEGATIVE ng/mL
Methadone Screen, Urine: NEGATIVE ng/mL
Nitrites, Initial: NEGATIVE ug/mL
OXYCODONE SCRN UR: NEGATIVE ng/mL
PROPOXYPHENE: NEGATIVE ng/mL
TAPENTADOLUR: NEGATIVE ng/mL
Tramadol Scrn, Ur: NEGATIVE ng/mL
Zolpidem, Urine: NEGATIVE ng/mL
pH, Initial: 5.7 pH (ref 4.5–8.9)

## 2014-08-20 NOTE — Progress Notes (Signed)
Urine drug screen for this encounter is consistent for prescribed medication hydrocodone. Ambien reported last taken 08/08/14 but I don't think screen was actually done for ambien.

## 2014-09-06 ENCOUNTER — Other Ambulatory Visit: Payer: Self-pay | Admitting: Registered Nurse

## 2014-09-12 ENCOUNTER — Encounter: Payer: Self-pay | Admitting: Registered Nurse

## 2014-09-12 ENCOUNTER — Encounter: Payer: 59 | Attending: Physical Medicine and Rehabilitation | Admitting: Registered Nurse

## 2014-09-12 VITALS — BP 128/68 | HR 80 | Resp 14

## 2014-09-12 DIAGNOSIS — G8929 Other chronic pain: Secondary | ICD-10-CM | POA: Insufficient documentation

## 2014-09-12 DIAGNOSIS — M545 Low back pain: Secondary | ICD-10-CM | POA: Insufficient documentation

## 2014-09-12 DIAGNOSIS — F17210 Nicotine dependence, cigarettes, uncomplicated: Secondary | ICD-10-CM | POA: Insufficient documentation

## 2014-09-12 DIAGNOSIS — Z79899 Other long term (current) drug therapy: Secondary | ICD-10-CM

## 2014-09-12 DIAGNOSIS — Z760 Encounter for issue of repeat prescription: Secondary | ICD-10-CM | POA: Diagnosis present

## 2014-09-12 DIAGNOSIS — G894 Chronic pain syndrome: Secondary | ICD-10-CM

## 2014-09-12 DIAGNOSIS — G620 Drug-induced polyneuropathy: Secondary | ICD-10-CM | POA: Diagnosis not present

## 2014-09-12 DIAGNOSIS — M79604 Pain in right leg: Secondary | ICD-10-CM | POA: Diagnosis not present

## 2014-09-12 DIAGNOSIS — M961 Postlaminectomy syndrome, not elsewhere classified: Secondary | ICD-10-CM | POA: Diagnosis not present

## 2014-09-12 DIAGNOSIS — Z9221 Personal history of antineoplastic chemotherapy: Secondary | ICD-10-CM | POA: Diagnosis not present

## 2014-09-12 DIAGNOSIS — T451X5A Adverse effect of antineoplastic and immunosuppressive drugs, initial encounter: Secondary | ICD-10-CM

## 2014-09-12 DIAGNOSIS — M79605 Pain in left leg: Secondary | ICD-10-CM | POA: Insufficient documentation

## 2014-09-12 DIAGNOSIS — Z5181 Encounter for therapeutic drug level monitoring: Secondary | ICD-10-CM

## 2014-09-12 DIAGNOSIS — T451X5S Adverse effect of antineoplastic and immunosuppressive drugs, sequela: Secondary | ICD-10-CM | POA: Diagnosis not present

## 2014-09-12 MED ORDER — HYDROCODONE-ACETAMINOPHEN 10-325 MG PO TABS: ORAL_TABLET | ORAL | Status: DC

## 2014-09-12 NOTE — Progress Notes (Signed)
Subjective:    Patient ID: Elizabeth Torres, female    DOB: November 26, 1975, 39 y.o.   MRN: [id number]  HPI: [name] is a 39 year old female who returns for follow up for chronic pain and medication refill. She says her pain is located in her lower back and lower extremities. She rates her pain 4. Her current exercise regime is walking. She's working 40 hours a week.   Pain Inventory Average Pain 4 Pain Right Now 4 My pain is constant, dull and aching  In the last 24 hours, has pain interfered with the following? General activity 3 Relation with others 3 Enjoyment of life 3 What TIME of day is your pain at its worst? evening Sleep (in general) Fair  Pain is worse with: walking, inactivity and standing Pain improves with: rest and medication Relief from Meds: 5  Mobility walk without assistance how many minutes can you walk? 30 ability to climb steps?  yes do you drive?  yes  Function disabled: date disabled .  Neuro/Psych No problems in this area  Prior Studies Any changes since last visit?  no  Physicians involved in your care Any changes since last visit?  no   Family History  Problem Relation Age of Onset  . Diabetes Mother   . Hypertension Mother   . Cancer Paternal Uncle    History   Social History  . Marital Status: Married    Spouse Name: N/A  . Number of Children: N/A  . Years of Education: N/A   Social History Main Topics  . Smoking status: Current Every Day Smoker -- 1.00 packs/day    Types: Cigarettes  . Smokeless tobacco: Never Used  . Alcohol Use: No  . Drug Use: Not on file  . Sexual Activity: Not on file   Other Topics Concern  . None   Social History Narrative   Past Surgical History  Procedure Laterality Date  . Spine surgery  2008    Dr Joya Salm  . Mastectomy Right     partial with lymph node dissection  . Breast surgery     Past Medical History  Diagnosis Date  . Cancer    BP 128/68 mmHg  Pulse 80  Resp 14  SpO2  99%  Opioid Risk Score:   Fall Risk Score: Moderate Fall Risk (6-13 points)  Review of Systems  Constitutional: Negative.   HENT: Negative.   Eyes: Negative.   Respiratory: Negative.   Cardiovascular: Negative.   Gastrointestinal: Negative.   Endocrine: Negative.   Genitourinary: Negative.   Musculoskeletal: Positive for myalgias, back pain and arthralgias.  Skin: Negative.   Allergic/Immunologic: Negative.   Neurological: Negative.   Hematological: Negative.   Psychiatric/Behavioral: Negative.        Objective:   Physical Exam  Constitutional: She is oriented to person, place, and time. She appears well-developed and well-nourished.  HENT:  Head: Normocephalic and atraumatic.  Neck: Normal range of motion. Neck supple.  Cardiovascular: Normal rate and regular rhythm.   Pulmonary/Chest: Effort normal and breath sounds normal.  Musculoskeletal:  Normal Muscle Bulk and Muscle Testing Reveals: Upper Extremities: Full ROM and Muscle strength 5/5 Back without Spinal or Paraspinal tenderness Lower Extremities: Full ROM and Muscle strength 5/5 Arises from chair with ease Narrow Based Gait     Neurological: She is alert and oriented to person, place, and time.  Skin: Skin is warm and dry.  Psychiatric: She has a normal mood and affect.  Nursing  note and vitals reviewed.         Assessment & Plan:  1. Lumbar postlaminectomy syndrome status post L5-S1 fusion with chronic S1 radiculopathy  Refilled: Hydrocodone 10/325 mg #120--one every 6 hours as needed for pain.  2. 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   15 minutes of face to face time was spent during this visit. All questions were encouraged and answered.  F/U in 3month

## 2014-10-10 ENCOUNTER — Encounter: Payer: 59 | Attending: Physical Medicine and Rehabilitation | Admitting: Registered Nurse

## 2014-10-10 ENCOUNTER — Encounter: Payer: Self-pay | Admitting: Registered Nurse

## 2014-10-10 VITALS — BP 122/72 | HR 84 | Resp 14

## 2014-10-10 DIAGNOSIS — M545 Low back pain: Secondary | ICD-10-CM | POA: Insufficient documentation

## 2014-10-10 DIAGNOSIS — M79604 Pain in right leg: Secondary | ICD-10-CM | POA: Diagnosis not present

## 2014-10-10 DIAGNOSIS — J0120 Acute ethmoidal sinusitis, unspecified: Secondary | ICD-10-CM

## 2014-10-10 DIAGNOSIS — T451X5A Adverse effect of antineoplastic and immunosuppressive drugs, initial encounter: Secondary | ICD-10-CM

## 2014-10-10 DIAGNOSIS — T451X5S Adverse effect of antineoplastic and immunosuppressive drugs, sequela: Secondary | ICD-10-CM | POA: Diagnosis not present

## 2014-10-10 DIAGNOSIS — G8929 Other chronic pain: Secondary | ICD-10-CM | POA: Diagnosis not present

## 2014-10-10 DIAGNOSIS — M961 Postlaminectomy syndrome, not elsewhere classified: Secondary | ICD-10-CM | POA: Insufficient documentation

## 2014-10-10 DIAGNOSIS — Z5181 Encounter for therapeutic drug level monitoring: Secondary | ICD-10-CM

## 2014-10-10 DIAGNOSIS — Z79899 Other long term (current) drug therapy: Secondary | ICD-10-CM

## 2014-10-10 DIAGNOSIS — Z760 Encounter for issue of repeat prescription: Secondary | ICD-10-CM | POA: Diagnosis present

## 2014-10-10 DIAGNOSIS — F17210 Nicotine dependence, cigarettes, uncomplicated: Secondary | ICD-10-CM | POA: Insufficient documentation

## 2014-10-10 DIAGNOSIS — G620 Drug-induced polyneuropathy: Secondary | ICD-10-CM | POA: Insufficient documentation

## 2014-10-10 DIAGNOSIS — G894 Chronic pain syndrome: Secondary | ICD-10-CM

## 2014-10-10 DIAGNOSIS — M79605 Pain in left leg: Secondary | ICD-10-CM | POA: Insufficient documentation

## 2014-10-10 DIAGNOSIS — Z9221 Personal history of antineoplastic chemotherapy: Secondary | ICD-10-CM | POA: Insufficient documentation

## 2014-10-10 MED ORDER — HYDROCODONE-ACETAMINOPHEN 10-325 MG PO TABS: ORAL_TABLET | ORAL | Status: DC

## 2014-10-10 MED ORDER — AZITHROMYCIN 250 MG PO TABS: ORAL_TABLET | ORAL | Status: DC

## 2014-10-10 NOTE — Progress Notes (Signed)
Subjective:    Patient ID: Elizabeth Torres, female    DOB: 01-27-1976, 39 y.o.   MRN: [id number]  HPI: [name] is a 39 year old female who returns for follow up for chronic pain and medication refill. She says her pain is located in her lower back and lower extremities. She rates her pain 4. Her current exercise regime is walking. She's working 40 hours a week.  She has a sinusitis will order a Z-pak today, she verbalizes understanding. Hold Pamelor while on Z-Pak.  Pain Inventory Average Pain 4 Pain Right Now 4 My pain is constant, dull and aching  In the last 24 hours, has pain interfered with the following? General activity 3 Relation with others 3 Enjoyment of life 3 What TIME of day is your pain at its worst? evening Sleep (in general) Fair  Pain is worse with: sitting and standing Pain improves with: rest and medication Relief from Meds: 5  Mobility Do you have any goals in this area?  no  Function Do you have any goals in this area?  no  Neuro/Psych No problems in this area  Prior Studies Any changes since last visit?  no  Physicians involved in your care Any changes since last visit?  no   Family History  Problem Relation Age of Onset  . Diabetes Mother   . Hypertension Mother   . Cancer Paternal Uncle    History   Social History  . Marital Status: Married    Spouse Name: N/A  . Number of Children: N/A  . Years of Education: N/A   Social History Main Topics  . Smoking status: Current Every Day Smoker -- 1.00 packs/day    Types: Cigarettes  . Smokeless tobacco: Never Used  . Alcohol Use: No  . Drug Use: Not on file  . Sexual Activity: Not on file   Other Topics Concern  . None   Social History Narrative   Past Surgical History  Procedure Laterality Date  . Spine surgery  2008    Dr Joya Salm  . Mastectomy Right     partial with lymph node dissection  . Breast surgery     Past Medical History  Diagnosis Date  . Cancer    BP  122/72 mmHg  Pulse 84  Resp 14  SpO2 97%  Opioid Risk Score:   Fall Risk Score: Low Fall Risk (0-5 points) (patient previously educated)  Review of Systems  All other systems reviewed and are negative.      Objective:   Physical Exam  Constitutional: She is oriented to person, place, and time. She appears well-developed and well-nourished.  HENT:  Head: Normocephalic and atraumatic.  Neck: Normal range of motion. Neck supple.  Cardiovascular: Normal rate and regular rhythm.   Pulmonary/Chest: Effort normal and breath sounds normal.  Musculoskeletal:  Normal Muscle Bulk and Muscle Testing Reveals: Upper Extremities: Full ROM and Muscle Strength 5/5 Lumbar Paraspinal Tenderness: L-3- L-5 Lower Extremities: Full ROM and Muscle Strength 5/5 Arises from chair with ease   Neurological: She is alert and oriented to person, place, and time.  Skin: Skin is warm and dry.  Psychiatric: She has a normal mood and affect.  Nursing note and vitals reviewed.         Assessment & Plan:  1. Lumbar postlaminectomy syndrome status post L5-S1 fusion with chronic S1 radiculopathy  Refilled: Hydrocodone 10/325 mg #120--one every 6 hours as needed for pain.  2. 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  3. Sinusitis: RX: Z-pak. Hold Pamelor while on Z-pak 15 minutes of face to face time was spent during this visit. All questions were encouraged and answered.  F/U in 27month

## 2014-10-30 ENCOUNTER — Other Ambulatory Visit: Payer: Self-pay | Admitting: Registered Nurse

## 2014-11-14 ENCOUNTER — Encounter: Payer: Self-pay | Admitting: Registered Nurse

## 2014-11-14 ENCOUNTER — Other Ambulatory Visit: Payer: Self-pay | Admitting: Physical Medicine & Rehabilitation

## 2014-11-14 ENCOUNTER — Encounter: Payer: 59 | Attending: Physical Medicine and Rehabilitation | Admitting: Registered Nurse

## 2014-11-14 VITALS — BP 132/85 | HR 77 | Resp 14

## 2014-11-14 DIAGNOSIS — M545 Low back pain: Secondary | ICD-10-CM | POA: Insufficient documentation

## 2014-11-14 DIAGNOSIS — M961 Postlaminectomy syndrome, not elsewhere classified: Secondary | ICD-10-CM | POA: Diagnosis not present

## 2014-11-14 DIAGNOSIS — Z5181 Encounter for therapeutic drug level monitoring: Secondary | ICD-10-CM | POA: Diagnosis not present

## 2014-11-14 DIAGNOSIS — G620 Drug-induced polyneuropathy: Secondary | ICD-10-CM | POA: Diagnosis not present

## 2014-11-14 DIAGNOSIS — Z79899 Other long term (current) drug therapy: Secondary | ICD-10-CM

## 2014-11-14 DIAGNOSIS — G8929 Other chronic pain: Secondary | ICD-10-CM | POA: Insufficient documentation

## 2014-11-14 DIAGNOSIS — M79604 Pain in right leg: Secondary | ICD-10-CM | POA: Insufficient documentation

## 2014-11-14 DIAGNOSIS — T451X5A Adverse effect of antineoplastic and immunosuppressive drugs, initial encounter: Secondary | ICD-10-CM

## 2014-11-14 DIAGNOSIS — Z760 Encounter for issue of repeat prescription: Secondary | ICD-10-CM | POA: Insufficient documentation

## 2014-11-14 DIAGNOSIS — G894 Chronic pain syndrome: Secondary | ICD-10-CM | POA: Diagnosis not present

## 2014-11-14 DIAGNOSIS — T451X5S Adverse effect of antineoplastic and immunosuppressive drugs, sequela: Secondary | ICD-10-CM | POA: Insufficient documentation

## 2014-11-14 DIAGNOSIS — Z9221 Personal history of antineoplastic chemotherapy: Secondary | ICD-10-CM | POA: Diagnosis not present

## 2014-11-14 DIAGNOSIS — F17210 Nicotine dependence, cigarettes, uncomplicated: Secondary | ICD-10-CM | POA: Insufficient documentation

## 2014-11-14 DIAGNOSIS — M79605 Pain in left leg: Secondary | ICD-10-CM | POA: Insufficient documentation

## 2014-11-14 MED ORDER — HYDROCODONE-ACETAMINOPHEN 10-325 MG PO TABS: ORAL_TABLET | ORAL | Status: DC

## 2014-11-14 NOTE — Progress Notes (Signed)
Subjective:    Patient ID: Elizabeth Torres, female    DOB: [birth year], 39 y.o.   MRN: [id number]  HPI: [name] is a 39 year old female who returns for follow up for chronic pain and medication refill. She says her pain is located in her lower back and lower extremities. She rates her pain 3. Her current exercise regime is walking. She's working 40 hours a week.  She showed me pictures of her phone a few weeks ago she noticed her middle finger on the right hand and on the left hand her index and small finger turned white it lasted a few seconds. This is not the first time this has occurred. It could be Raynaud's Phenomenon instructed to call PCP and follow up she verbalizes understanding.    Pain Inventory Average Pain 4 Pain Right Now 3 My pain is constant, dull and aching  In the last 24 hours, has pain interfered with the following? General activity 3 Relation with others 3 Enjoyment of life 3 What TIME of day is your pain at its worst? evening Sleep (in general) Fair  Pain is worse with: sitting, inactivity and standing Pain improves with: rest and medication Relief from Meds: 5  Mobility Do you have any goals in this area?  no  Function Do you have any goals in this area?  no  Neuro/Psych No problems in this area  Prior Studies Any changes since last visit?  no  Physicians involved in your care Any changes since last visit?  no   Family History  Problem Relation Age of Onset  . Diabetes Mother   . Hypertension Mother   . Cancer Paternal Uncle    History   Social History  . Marital Status: Married    Spouse Name: N/A  . Number of Children: N/A  . Years of Education: N/A   Social History Main Topics  . Smoking status: Current Every Day Smoker -- 1.00 packs/day    Types: Cigarettes  . Smokeless tobacco: Never Used  . Alcohol Use: No  . Drug Use: Not on file  . Sexual Activity: Not on file   Other Topics Concern  . None   Social History  Narrative   Past Surgical History  Procedure Laterality Date  . Spine surgery  2008    Dr Joya Salm  . Mastectomy Right     partial with lymph node dissection  . Breast surgery     Past Medical History  Diagnosis Date  . Cancer    BP 132/85 mmHg  Pulse 77  Resp 14  SpO2 99%  Opioid Risk Score:   Fall Risk Score: Low Fall Risk (0-5 points)`1  Depression screen PHQ 2/9  Depression screen PHQ 2/9 11/14/2014  Decreased Interest 0  Down, Depressed, Hopeless 0  PHQ - 2 Score 0  Altered sleeping 1  Tired, decreased energy 1  Change in appetite 0  Feeling bad or failure about yourself  0  Trouble concentrating 0  Moving slowly or fidgety/restless 0  Suicidal thoughts 0  PHQ-9 Score 2     Review of Systems  All other systems reviewed and are negative.      Objective:   Physical Exam  Constitutional: She is oriented to person, place, and time. She appears well-developed and well-nourished.  HENT:  Head: Normocephalic and atraumatic.  Neck: Normal range of motion. Neck supple.  Cardiovascular: Normal rate and regular rhythm.   Pulmonary/Chest: Effort normal and breath sounds  normal.  Musculoskeletal:  Normal Muscle Bulk and Muscle Testing Reveals: Upper Extremities: Full ROM and Muscle Strength 5/5 Back without spinal or paraspinal tenderness Lower Extremities: Full ROM and Muscle Strength 5/5 Arises from chair with ease Narrow Based gait  Neurological: She is alert and oriented to person, place, and time.  Skin: Skin is warm and dry.  Psychiatric: She has a normal mood and affect.  Nursing note and vitals reviewed.         Assessment & Plan:  1. Lumbar postlaminectomy syndrome status post L5-S1 fusion with chronic S1 radiculopathy  Refilled: Hydrocodone 10/325 mg #120--one every 6 hours as needed for pain.  2. 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   15 minutes of face to face time was spent  during this visit. All questions were encouraged and answered.  F/U in 74month

## 2014-11-15 LAB — PMP ALCOHOL METABOLITE (ETG): Ethyl Glucuronide (EtG): NEGATIVE ng/mL

## 2014-11-16 LAB — OPIATES/OPIOIDS (LC/MS-MS)
CODEINE URINE: NEGATIVE ng/mL (ref <50)
HYDROMORPHONE: NEGATIVE ng/mL — AB (ref <50)
Hydrocodone: 598 ng/mL (ref <50)
Morphine Urine: NEGATIVE ng/mL (ref <50)
NOROXYCODONE, UR: NEGATIVE ng/mL (ref <50)
Norhydrocodone, Ur: 724 ng/mL (ref <50)
OXYCODONE, UR: NEGATIVE ng/mL (ref <50)
Oxymorphone: NEGATIVE ng/mL (ref <50)

## 2014-11-17 LAB — PRESCRIPTION MONITORING PROFILE (SOLSTAS)
AMPHETAMINE/METH: NEGATIVE ng/mL
Barbiturate Screen, Urine: NEGATIVE ng/mL
Benzodiazepine Screen, Urine: NEGATIVE ng/mL
Buprenorphine, Urine: NEGATIVE ng/mL
COCAINE METABOLITES: NEGATIVE ng/mL
CREATININE, URINE: 14.48 mg/dL — AB (ref >20.0)
Cannabinoid Scrn, Ur: NEGATIVE ng/mL
Carisoprodol, Urine: NEGATIVE ng/mL
ECSTASY: NEGATIVE ng/mL
Fentanyl, Ur: NEGATIVE ng/mL
MEPERIDINE UR: NEGATIVE ng/mL
METHADONE SCREEN, URINE: NEGATIVE ng/mL
Nitrites, Initial: NEGATIVE ug/mL
Oxycodone Screen, Ur: NEGATIVE ng/mL
PH URINE, INITIAL: 5.8 pH (ref 4.5–8.9)
Propoxyphene: NEGATIVE ng/mL
Tapentadol, urine: NEGATIVE ng/mL
Tramadol Scrn, Ur: NEGATIVE ng/mL
ZOLPIDEM, URINE: NEGATIVE ng/mL

## 2014-11-20 NOTE — Consult Note (Signed)
Reason for Visit: This 39 year old Female patient presents to the clinic for initial evaluation of  Breast cancer .   Referred by Dr. Grayland Ormond.  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 This was confirmed on mammogram. She underwent a wide local excision which was positive forinvasive mammary carcinoma nototherwise specified. Tumor was 0.7 cm. 4 sentinel lymph nodes were all negative for metastatic disease. Margins were clear but close at 2.6 mm. Tumor was triple negative. Patient was enrolled on protocoland is undergone Cytoxan Adriamycin with weekly Taxol. She tolerated her treatments well. She specifically denies cough or bone pain. She still has some persistent breast tenderness. She is otherwise without complaint.she was treated on NSABP protocol B. 49.she still has some breast tenderness.  Past Hx:    Oncology Protocol: Patient participating in NSABP B-49 research study - receiving Standard of Care regimen of Adriamycin, Cytoxan and Taxol. Research contact is [employer]. Call cancer center at [phone] for any problems or questions.   Heart Murmer:    Degenerative Disc Disease:    Tobacco Use:    wheezing:    Right breast cancer:    Right breast biopsy: Dec 2012   Cesarean Section: 2002   Lumbar Fusion: 2012  Past, Family and Social History:   Past Medical History positive    Cardiovascular Heart murmur    Respiratory asthma; History of wheezing    Past Surgical History Lumbar fusion, cesarean section    Family  History positive    Family History Comments Family history of adult onset diabetes and cardiovascular heart disease    Social History positive    Social History Comments 30-pack-year smoking history, no EtOH abuse history    Additional Past Medical and Surgical History Accompanied by protocol nurse today.   Allergies:   Penicillin: Other  Sulfa drugs: Other  Ampicillin: Other  Prochlorperazine: Headaches  Review of Systems:   General negative    Performance Status (ECOG) 0    Skin negative    Breast see HPI    Ophthalmologic negative    ENMT negative    Respiratory and Thorax negative    Cardiovascular negative    Gastrointestinal negative    Genitourinary negative    Musculoskeletal negative    Neurological negative    Psychiatric negative    Hematology/Lymphatics negative    Endocrine negative    Allergic/Immunologic negative   Nursing Notes:  Nursing Vital Signs and Chemo Nursing Nursing Notes: *CC Vital Signs Flowsheet:   22-Jul-13 15:40   Temp Temperature 97.6   Pulse Pulse 87   Respirations Respirations 18   SBP SBP 115   DBP DBP 78   Current Weight (kg) (kg) 73.7   Height (cm) centimeters 162.5   BSA (m2) 1.7   Physical Exam:  General/Skin/HEENT:   General normal    Skin normal    Eyes normal    ENMT normal    Head and Neck normal    Additional PE Well-developed well-nourished female in NAD. She has a right lumpectomy scar.  Fair cosmetic result with some retraction towards the scar. No dominant mass or nodularity is noted in either breast into position examined. No axillary or supraclavicular adenopathy is appreciated. Port-A-Cath is placed in the left anterior chest wall. Lungs are clear to A&P cardiac examination shows regular rate and rhythm.   Breasts/Resp/CV/GI/GU:   Respiratory and Thorax normal    Cardiovascular normal    Gastrointestinal normal    Genitourinary normal   MS/Neuro/Psych/Lymph:   Musculoskeletal  normal    Neurological normal    Lymphatics normal   Assessment and Plan:  Impression:   stage I invasive mammary carcinoma and 39 year old female treated with adjuvant chemotherapy under NSABP protocol B. 49  Plan:   at this time like to treat her right breast with adjuvant radiation therapy. Would plan on delivering 5000 cGy over 5 weeks. Would also boost or scar based on the close margin another 1600 cGy using electron beam therapy. Risks and benefits of treatment including skin reaction, inclusion of superficial lung, I have reviewed the current B. 49 protocol and will treat MI discretion with the above-stated doses recommended by [facility] guidelines. I have set her up for CT simulation later this week.  I would like to take this opportunity to thank you for allowing me to continue to participate in this patient's care.  CC Referral:   cc: Dr. Otilio Miu   Electronic Signatures: Baruch Gouty, Roda Shutters (MD)  (Signed 22-Jul-13 16:20)  Authored: HPI, Diagnosis, Past Hx, PFSH, Allergies, ROS, Nursing Notes, Physical Exam, Encounter Assessment and Plan, CC Referring Physician   Last Updated: 22-Jul-13 16:20 by Armstead Peaks (MD)

## 2014-11-21 NOTE — Progress Notes (Signed)
Urine drug screen for this encounter is consistent for prescribed medication. Reported Ambien the night before but there are no metabolites present.

## 2014-11-25 NOTE — Op Note (Signed)
PATIENT NAME:  Elizabeth Torres, Elizabeth Torres MR#:  [id number] DATE OF BIRTH:  Jan 19, 1976  DATE OF PROCEDURE:  08/17/2011  PREOPERATIVE DIAGNOSIS: Right breast cancer.   POSTOPERATIVE DIAGNOSIS: Right breast cancer.   PROCEDURES:  1. Right partial mastectomy.  2. Right sentinel node biopsy.  3. Left subclavian vein port placement with fluoro.   SURGEON: Phoebe Perch, M.D.   ANESTHESIA: General with LMA.   INDICATIONS: This is a patient with right breast cancer, triple negative in nature with positive margins for DCIS. Preoperatively we discussed rationale for re-excision partial mastectomy, sentinel node biopsy, the potential for axillary node dissection, and port placement. We discussed the risks of bleeding, infection, recurrence of disease, positive margins, and cosmetic deformity including hematoma, seroma, and draining wound, also the risk of port placement with infection, thrombosis, nonfunction, breakage, pneumothorax, hemopneumothorax, any of which could require further surgery, chest tube placement, etc., and this was all reviewed for her and her family in the preop holding area. They understood and agreed to proceed. She had no questions.   FINDINGS: Partial mastectomy was labeled with a nylon suture long lateral, short superior. The sentinel node was read as two nodes negative for macrometastases on touch prep. No frozen section was available.  Subclavian vein port placement on the left was performed with fluoroscopy and postoperative chest film was ordered.   DESCRIPTION OF PROCEDURE: The patient was induced to general anesthesia, given IV antibiotics. VTE prophylaxis was in place. She was prepped and draped in a sterile fashion. She had been injected with radionuclide preoperatively and was injected with Lymphazurin blue, 0.25 mL around the periareolar area. Then transcutaneous scanning of the axilla demonstrated an area of high counts. This was marked and then an incision was made after placing  a small amount of Marcaine in the subcutaneous tissues.   Dissection down to identifiable counts and a blue-stained lymph node was performed. It was elevated and sent off for examination.  Sentinel node evaluation by pathology was reported intraoperatively and found to be negative by touch prep.  No frozen section was available. There were two lymph nodes analyzed.   Once assuring that hemostasis was adequate, additional Marcaine was placed and the wound was closed with deep sutures of 3-0 Vicryl followed by 4-0 subcuticular Monocryl.   Attention was turned to the right biopsy site. The previous incision was drawn out as a lenticular incision to encompass the previous incision, and this was executed using electrocautery. Dissection around the hematoma was performed. The hematoma was evacuated and a nylon suture was utilized to label long lateral, short superior, and then the specimen was sent off in formalin.    Hemostasis was with electrocautery. Additional Marcaine was placed, and once assuring that hemostasis was adequate, the wound was closed with two layers of 3-0 Vicryl deep sutures followed by 4-0 subcuticular Monocryl. Steri-Strips, Mastisol, and sterile dressings were placed.  Gloves and gowns and instruments were changed. The patient was reprepped and draped and a surgical pause was performed. Then local anesthetic was placed into the subcutaneous tissues around the left subclavian area and the anterior chest wall on the left, and on the first attempt a large-bore needle was placed. Seldinger wire was placed. Fluoroscopy demonstrated that the wire was in the superior vena cava. An incision was made and a port pocket was developed with electrocautery and blunt finger dissection. Hemostasis was adequate. Over the Seldinger wire was placed the introducer and dilator followed by the previously flushed port. Fluoroscopy again demonstrated that  the port was in the superior vena cava in proper  position. It was trimmed and attached to the previously flushed port which was placed in the pocket, held in with 3-0 Prolenes. It was flushed for function and aspirated and flushed again and then the wound was closed with deep sutures of 3-0 Vicryl followed by 4-0 subcuticular Monocryl. Steri-Strips, Mastisol, and sterile dressings were placed.      The patient tolerated the procedure well. There were no complications. She was taken to the recovery room in stable condition. Postoperative chest film was ordered. Sponge, lap, and needle counts were correct.     ____________________________ Jerrol Banana. Burt Knack, MD rec:bjt D: 08/17/2011 14:59:57 ET T: 08/18/2011 10:03:19 ET JOB#: [id number]  cc: Jerrol Banana. Burt Knack, MD, <Dictator> Florene Glen MD ELECTRONICALLY SIGNED 08/21/2011 21:44

## 2014-11-25 NOTE — H&P (Signed)
PATIENT NAME:  Elizabeth Torres, CHOUINARD MR#:  [id number] DATE OF BIRTH:  [birth year]  DATE OF ADMISSION:  08/17/2011  CHIEF COMPLAINT: Right breast cancer.   HISTORY OF PRESENT ILLNESS: This is a patient with a right breast mass that was biopsied which showed infiltrating ductal carcinoma ER/PR and HER-2/neu negative so triple negative disease and she has negative margins for invasive disease but positive margins for DCIS and ADH. She is here for elective re-excision partial mastectomy on the right, right sentinel node biopsy, possible right axillary node dissection and port placement with fluoroscopy.   PAST MEDICAL HISTORY: None other than the newly diagnosed breast cancer.   PAST SURGICAL HISTORY: 1. Recent breast biopsy.  2. Back surgery.  3. Cesarean section.   MEDICATIONS:  1. Bupropion. 2. Wellbutrin. 3. Ibuprofen.   ALLERGIES: Penicillin and sulfa.   SOCIAL HISTORY: Patient drinks and is a smoker.   FAMILY HISTORY: High blood pressure, heart disease and diabetes. No breast cancer history.   PHYSICAL EXAMINATION:  GENERAL: Healthy female patient.   NECK: No palpable neck nodes.   CHEST: Clear to auscultation.   CARDIAC: Regular rate and rhythm.   ABDOMEN: Soft, nontender.   EXTREMITIES: Without edema.   NEUROLOGIC: Grossly intact.   INTEGUMENT: No jaundice.   BREASTS: Examination shows a healing wound with hematoma and minimal ecchymosis on the right breast. No other masses.   LABORATORY, DIAGNOSTIC AND RADIOLOGICAL DATA: Pathology report demonstrates an 8 mm infiltrating ductal carcinoma, negative margins, but positive DCIS at the margins with possible ADH and a triple negative profile.   ASSESSMENT AND PLAN: This is a patient with triple negative breast cancer in a young female patient with positive margins for DCIS. She requires re-excision, partial mastectomy for margins. She also requires sentinel node biopsy with the potential for axillary node dissection and  Port-A-Cath placement has been discussed with her as well for aggressive chemotherapy and the need for radiotherapy. She has chosen breast conservation therapy over mastectomy and we will plan as above.   Patient has been counseled concerning the options of mastectomy versus breast conservation therapy which she has chosen. She has been counseled concerning the absolute need for radiotherapy and Dr. Grayland Ormond has seen the patient concerning chemotherapy in the immediate postoperative period and has requested port placement.   We discussed the options and the risks of bleeding, infection, hematoma, seroma, cosmetic deformity, open draining wound, sentinel node biopsy versus axillary dissection with drain placement and temporary or permanent numbness or lymphedema, also the need for radiotherapy was discussed.   In relation to port placement we have discussed the options of port versus PICC line, and the risks of bleeding, infection, thrombosis, nonfunction, breakage, pneumothorax, hemopneumothorax, any of which could require chest tube placement and/or additional surgery. This was all reviewed for she and her husband.   They understood and agreed to proceed.  ____________________________ Jerrol Banana Burt Knack, MD rec:cms D: 08/17/2011 08:53:36 ET T: 08/17/2011 09:01:45 ET JOB#: [id number]  cc: Jerrol Banana. Burt Knack, MD, <Dictator> Florene Glen MD ELECTRONICALLY SIGNED 08/21/2011 21:44

## 2014-12-14 ENCOUNTER — Encounter: Payer: Self-pay | Admitting: Physical Medicine & Rehabilitation

## 2014-12-14 ENCOUNTER — Ambulatory Visit (HOSPITAL_BASED_OUTPATIENT_CLINIC_OR_DEPARTMENT_OTHER): Payer: 59 | Admitting: Physical Medicine & Rehabilitation

## 2014-12-14 ENCOUNTER — Encounter: Payer: 59 | Attending: Physical Medicine and Rehabilitation

## 2014-12-14 VITALS — BP 146/93 | HR 93 | Resp 14

## 2014-12-14 DIAGNOSIS — G8929 Other chronic pain: Secondary | ICD-10-CM | POA: Diagnosis not present

## 2014-12-14 DIAGNOSIS — Z760 Encounter for issue of repeat prescription: Secondary | ICD-10-CM | POA: Insufficient documentation

## 2014-12-14 DIAGNOSIS — G620 Drug-induced polyneuropathy: Secondary | ICD-10-CM | POA: Diagnosis not present

## 2014-12-14 DIAGNOSIS — T451X5A Adverse effect of antineoplastic and immunosuppressive drugs, initial encounter: Secondary | ICD-10-CM

## 2014-12-14 DIAGNOSIS — M79604 Pain in right leg: Secondary | ICD-10-CM | POA: Diagnosis not present

## 2014-12-14 DIAGNOSIS — F17210 Nicotine dependence, cigarettes, uncomplicated: Secondary | ICD-10-CM | POA: Diagnosis not present

## 2014-12-14 DIAGNOSIS — M961 Postlaminectomy syndrome, not elsewhere classified: Secondary | ICD-10-CM | POA: Diagnosis not present

## 2014-12-14 DIAGNOSIS — T451X5S Adverse effect of antineoplastic and immunosuppressive drugs, sequela: Secondary | ICD-10-CM | POA: Insufficient documentation

## 2014-12-14 DIAGNOSIS — M79605 Pain in left leg: Secondary | ICD-10-CM | POA: Diagnosis not present

## 2014-12-14 DIAGNOSIS — M545 Low back pain: Secondary | ICD-10-CM | POA: Insufficient documentation

## 2014-12-14 DIAGNOSIS — Z9221 Personal history of antineoplastic chemotherapy: Secondary | ICD-10-CM | POA: Insufficient documentation

## 2014-12-14 MED ORDER — NORTRIPTYLINE HCL 10 MG PO CAPS: 10.0000 mg | ORAL_CAPSULE | Freq: Every day | ORAL | Status: DC

## 2014-12-14 MED ORDER — HYDROCODONE-ACETAMINOPHEN 10-325 MG PO TABS: ORAL_TABLET | ORAL | Status: DC

## 2014-12-14 NOTE — Patient Instructions (Signed)
Cont walking and weight loss efforts

## 2014-12-14 NOTE — Progress Notes (Signed)
Subjective:    Patient ID: Elizabeth Torres, female    DOB: 13-Jun-1976, 39 y.o.   MRN: [id number] underwent conservative care including physical therapy and medications  including narcotic analgesics, nonnarcotic, analgesics, NSAIDS, and  Neurontin prior to undergoing a left L5-S1 laminectomy and diskectomy  for herniated nucleus pulposus on Dec 13, 2009. She had a recurrent  disk at that same level and underwent an L5-S1 fusion on January 07, 2010.  Interval medical history positive for diagnosis of right breast  carcinoma. She has undergone lumpectomy as well as a partial mastectomy  on July 26, 2011. She had 16 chemotherapy treatments  , followed by radiation therapy 5 days per week  for 7 wks.  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 Starting to do more walking and has lost 8 pounds so far on a diet Pain Inventory Average Pain 4 Pain Right Now 4 My pain is constant, dull and aching  In the last 24 hours, has pain interfered with the following? General activity 3 Relation with others 2 Enjoyment of life 3 What TIME of day is your pain at its worst? evening Sleep (in general) Fair  Pain is worse with: walking and standing Pain improves with: rest and medication Relief from Meds: not answered  Mobility walk without assistance Do you have any goals in this area?  no  Function Do you have any goals in this area?  no  Neuro/Psych No problems in this area  Prior Studies Any changes since last visit?  no  Physicians involved in your care Any changes since last visit?  no   Family History  Problem Relation Age of Onset  . Diabetes Mother   . Hypertension Mother   . Cancer Paternal Uncle    History   Social History  . Marital Status: Married    Spouse Name: N/A  . Number of Children: N/A  . Years of  Education: N/A   Social History Main Topics  . Smoking status: Current Every Day Smoker -- 1.00 packs/day    Types: Cigarettes  . Smokeless tobacco: Never Used  . Alcohol Use: No  . Drug Use: Not on file  . Sexual Activity: Not on file   Other Topics Concern  . None   Social History Narrative   Past Surgical History  Procedure Laterality Date  . Spine surgery  2008    Dr Joya Salm  . Mastectomy Right     partial with lymph node dissection  . Breast surgery     Past Medical History  Diagnosis Date  . Cancer    BP 146/93 mmHg  Pulse 93  Resp 14  SpO2 99%  Opioid Risk Score:   Fall Risk Score: Low Fall Risk (0-5 points)`1  Depression screen PHQ 2/9  Depression screen PHQ 2/9 11/14/2014  Decreased Interest 0  Down, Depressed, Hopeless 0  PHQ - 2 Score 0  Altered sleeping 1  Tired, decreased energy 1  Change in appetite 0  Feeling bad or failure about yourself  0  Trouble concentrating 0  Moving slowly or fidgety/restless 0  Suicidal thoughts 0  PHQ-9 Score 2     Review of Systems  Musculoskeletal: Positive for back pain.  All other systems reviewed and are negative.      Objective:   Physical Exam  Constitutional: She  is oriented to person, place, and time. She appears well-developed and well-nourished.  HENT:  Head: Normocephalic and atraumatic.  Eyes: Conjunctivae are normal. Pupils are equal, round, and reactive to light.  Neck: Normal range of motion.  Musculoskeletal:  Lumbar spine has a healed midline incision No tenderness to palpation Lumbar spine range of motion 75% flexion extension lateral bending  Negative straight leg raising    Neurological: She is alert and oriented to person, place, and time. Gait normal.  Motor strength is 5/5 bilateral Deltoid, biceps, triceps, grip, hip flexor, knee extensor, ankle dorsiflexor and plantar flexor  Psychiatric: She has a normal mood and affect.  Nursing note and vitals reviewed.           Assessment & Plan:  1. Lumbar postlaminectomy syndrome with chronic postoperative pain, she's had some increase of pain with weight gain but she is starting to walk and diet and has lost 8 pounds thus far. We'll continue current dose of hydrocodone 10/325 one by mouth 4 times a day #120 Continue opioid monitoring program. This consists of regular clinic visits, examinations, urine drug screen, pill counts as well as use of [place] controlled substance reporting System. 2. 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 Has decent relief with nortriptyline 10 mg daily at bedtime  Nurse practitioner visit monthly basis M.D. Visit yearly basis

## 2014-12-26 ENCOUNTER — Ambulatory Visit: Payer: Self-pay | Admitting: General Surgery

## 2015-01-01 ENCOUNTER — Other Ambulatory Visit: Payer: Self-pay | Admitting: Registered Nurse

## 2015-01-01 ENCOUNTER — Ambulatory Visit: Payer: Self-pay | Admitting: General Surgery

## 2015-01-11 ENCOUNTER — Encounter: Payer: 59 | Admitting: Registered Nurse

## 2015-01-15 ENCOUNTER — Encounter: Payer: 59 | Attending: Physical Medicine and Rehabilitation | Admitting: Registered Nurse

## 2015-01-15 ENCOUNTER — Encounter: Payer: Self-pay | Admitting: Registered Nurse

## 2015-01-15 VITALS — BP 124/86 | HR 82 | Resp 14

## 2015-01-15 DIAGNOSIS — Z9221 Personal history of antineoplastic chemotherapy: Secondary | ICD-10-CM | POA: Diagnosis not present

## 2015-01-15 DIAGNOSIS — F17210 Nicotine dependence, cigarettes, uncomplicated: Secondary | ICD-10-CM | POA: Insufficient documentation

## 2015-01-15 DIAGNOSIS — M961 Postlaminectomy syndrome, not elsewhere classified: Secondary | ICD-10-CM

## 2015-01-15 DIAGNOSIS — G620 Drug-induced polyneuropathy: Secondary | ICD-10-CM

## 2015-01-15 DIAGNOSIS — T451X5S Adverse effect of antineoplastic and immunosuppressive drugs, sequela: Secondary | ICD-10-CM | POA: Insufficient documentation

## 2015-01-15 DIAGNOSIS — M79605 Pain in left leg: Secondary | ICD-10-CM | POA: Insufficient documentation

## 2015-01-15 DIAGNOSIS — T451X5A Adverse effect of antineoplastic and immunosuppressive drugs, initial encounter: Secondary | ICD-10-CM

## 2015-01-15 DIAGNOSIS — Z760 Encounter for issue of repeat prescription: Secondary | ICD-10-CM | POA: Diagnosis not present

## 2015-01-15 DIAGNOSIS — Z5181 Encounter for therapeutic drug level monitoring: Secondary | ICD-10-CM | POA: Diagnosis not present

## 2015-01-15 DIAGNOSIS — G894 Chronic pain syndrome: Secondary | ICD-10-CM

## 2015-01-15 DIAGNOSIS — M545 Low back pain: Secondary | ICD-10-CM | POA: Insufficient documentation

## 2015-01-15 DIAGNOSIS — M79604 Pain in right leg: Secondary | ICD-10-CM | POA: Diagnosis not present

## 2015-01-15 DIAGNOSIS — G8929 Other chronic pain: Secondary | ICD-10-CM | POA: Insufficient documentation

## 2015-01-15 DIAGNOSIS — Z79899 Other long term (current) drug therapy: Secondary | ICD-10-CM

## 2015-01-15 MED ORDER — HYDROCODONE-ACETAMINOPHEN 10-325 MG PO TABS: ORAL_TABLET | ORAL | Status: DC

## 2015-01-15 NOTE — Progress Notes (Signed)
Subjective:    Patient ID: Elizabeth Torres, female    DOB: [birth year], 39 y.o.   MRN: [id number]  HPI: Elizabeth Torres is a 39 year old female who returns for follow up for chronic pain and medication refill. She says her pain is located in her lower back and lower extremities. She rates her pain 3. Her current exercise regime is walking is performing stretching and strengthening exercises. She's working 40 hours a week.   Pain Inventory Average Pain 4 Pain Right Now 3 My pain is constant, dull and aching  In the last 24 hours, has pain interfered with the following? General activity 3 Relation with others 3 Enjoyment of life 2 What TIME of day is your pain at its worst? evening Sleep (in general) Fair  Pain is worse with: sitting, inactivity and standing Pain improves with: rest and medication Relief from Meds: 5  Mobility Do you have any goals in this area?  no  Function Do you have any goals in this area?  no  Neuro/Psych No problems in this area  Prior Studies Any changes since last visit?  no  Physicians involved in your care Any changes since last visit?  no   Family History  Problem Relation Age of Onset  . Diabetes Mother   . Hypertension Mother   . Cancer Paternal Uncle    History   Social History  . Marital Status: Married    Spouse Name: N/A  . Number of Children: N/A  . Years of Education: N/A   Social History Main Topics  . Smoking status: Current Every Day Smoker -- 1.00 packs/day    Types: Cigarettes  . Smokeless tobacco: Never Used  . Alcohol Use: No  . Drug Use: Not on file  . Sexual Activity: Not on file   Other Topics Concern  . None   Social History Narrative   Past Surgical History  Procedure Laterality Date  . Spine surgery  2008    Dr Joya Salm  . Mastectomy Right     partial with lymph node dissection  . Breast surgery     Past Medical History  Diagnosis Date  . Cancer    BP 124/86 mmHg  Pulse 82  Resp 14  SpO2  95%  Opioid Risk Score:   Fall Risk Score: Low Fall Risk (0-5 points) (pt previously educated)`1  Depression screen PHQ 2/9  Depression screen PHQ 2/9 11/14/2014  Decreased Interest 0  Down, Depressed, Hopeless 0  PHQ - 2 Score 0  Altered sleeping 1  Tired, decreased energy 1  Change in appetite 0  Feeling bad or failure about yourself  0  Trouble concentrating 0  Moving slowly or fidgety/restless 0  Suicidal thoughts 0  PHQ-9 Score 2     Review of Systems  All other systems reviewed and are negative.      Objective:   Physical Exam  Constitutional: She is oriented to person, place, and time. She appears well-developed and well-nourished.  HENT:  Head: Normocephalic and atraumatic.  Neck: Normal range of motion. Neck supple.  Cardiovascular: Normal rate and regular rhythm.   Pulmonary/Chest: Effort normal and breath sounds normal.  Musculoskeletal:  Normal Muscle Bulk and Muscle Testing Reveals: Upper Extremities: Full ROM and Muscle Strength 5/5 Back without spinal or paraspinal tenderness Lower Extremities: Full ROM and Muscle Strength 5/5 Arises from chair with ease Narrow Based gait  Neurological: She is alert and oriented to person, place, and time.  Skin: Skin is warm and dry.  Psychiatric: She has a normal mood and affect.  Nursing note and vitals reviewed.         Assessment & Plan:  1. Lumbar postlaminectomy syndrome status post L5-S1 fusion with chronic S1 radiculopathy  Refilled: Hydrocodone 10/325 mg #120--one every 6 hours as needed for pain. Second script given to accommodate schedule appointment 2. 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   15 minutes of face to face time was spent during this visit. All questions were encouraged and answered.  F/U in 21month

## 2015-01-16 ENCOUNTER — Encounter: Payer: Self-pay | Admitting: *Deleted

## 2015-02-19 ENCOUNTER — Encounter (HOSPITAL_COMMUNITY): Payer: Self-pay | Admitting: Physical Medicine and Rehabilitation

## 2015-02-19 ENCOUNTER — Emergency Department (HOSPITAL_COMMUNITY): Payer: 59

## 2015-02-19 ENCOUNTER — Encounter: Payer: Self-pay | Admitting: Registered Nurse

## 2015-02-19 ENCOUNTER — Encounter: Payer: 59 | Attending: Physical Medicine and Rehabilitation | Admitting: Registered Nurse

## 2015-02-19 ENCOUNTER — Emergency Department (HOSPITAL_COMMUNITY)
Admission: EM | Admit: 2015-02-19 | Discharge: 2015-02-19 | Disposition: A | Discharge status: Home / Self Care | Payer: 59 | Attending: Emergency Medicine | Admitting: Emergency Medicine

## 2015-02-19 VITALS — BP 134/86 | HR 83

## 2015-02-19 DIAGNOSIS — M545 Low back pain: Secondary | ICD-10-CM | POA: Diagnosis not present

## 2015-02-19 DIAGNOSIS — I491 Atrial premature depolarization: Secondary | ICD-10-CM | POA: Insufficient documentation

## 2015-02-19 DIAGNOSIS — G620 Drug-induced polyneuropathy: Secondary | ICD-10-CM | POA: Diagnosis not present

## 2015-02-19 DIAGNOSIS — Z760 Encounter for issue of repeat prescription: Secondary | ICD-10-CM | POA: Insufficient documentation

## 2015-02-19 DIAGNOSIS — G894 Chronic pain syndrome: Secondary | ICD-10-CM

## 2015-02-19 DIAGNOSIS — Z5181 Encounter for therapeutic drug level monitoring: Secondary | ICD-10-CM

## 2015-02-19 DIAGNOSIS — M79605 Pain in left leg: Secondary | ICD-10-CM | POA: Diagnosis not present

## 2015-02-19 DIAGNOSIS — Z853 Personal history of malignant neoplasm of breast: Secondary | ICD-10-CM | POA: Diagnosis not present

## 2015-02-19 DIAGNOSIS — Z9221 Personal history of antineoplastic chemotherapy: Secondary | ICD-10-CM | POA: Insufficient documentation

## 2015-02-19 DIAGNOSIS — Z880 Allergy status to penicillin: Secondary | ICD-10-CM | POA: Diagnosis not present

## 2015-02-19 DIAGNOSIS — Z79899 Other long term (current) drug therapy: Secondary | ICD-10-CM | POA: Diagnosis not present

## 2015-02-19 DIAGNOSIS — R002 Palpitations: Secondary | ICD-10-CM | POA: Diagnosis present

## 2015-02-19 DIAGNOSIS — F17210 Nicotine dependence, cigarettes, uncomplicated: Secondary | ICD-10-CM | POA: Insufficient documentation

## 2015-02-19 DIAGNOSIS — M961 Postlaminectomy syndrome, not elsewhere classified: Secondary | ICD-10-CM | POA: Insufficient documentation

## 2015-02-19 DIAGNOSIS — T451X5A Adverse effect of antineoplastic and immunosuppressive drugs, initial encounter: Secondary | ICD-10-CM

## 2015-02-19 DIAGNOSIS — T451X5S Adverse effect of antineoplastic and immunosuppressive drugs, sequela: Secondary | ICD-10-CM | POA: Insufficient documentation

## 2015-02-19 DIAGNOSIS — Z720 Tobacco use: Secondary | ICD-10-CM | POA: Diagnosis not present

## 2015-02-19 DIAGNOSIS — G8929 Other chronic pain: Secondary | ICD-10-CM | POA: Insufficient documentation

## 2015-02-19 DIAGNOSIS — M79604 Pain in right leg: Secondary | ICD-10-CM | POA: Diagnosis not present

## 2015-02-19 LAB — COMPREHENSIVE METABOLIC PANEL
ALBUMIN: 3.9 g/dL (ref 3.5–5.0)
ALK PHOS: 59 U/L (ref 38–126)
ALT: 23 U/L (ref 14–54)
AST: 27 U/L (ref 15–41)
Anion gap: 6 (ref 5–15)
BUN: 7 mg/dL (ref 6–20)
CHLORIDE: 102 mmol/L (ref 101–111)
CO2: 28 mmol/L (ref 22–32)
CREATININE: 0.72 mg/dL (ref 0.44–1.00)
Calcium: 9.4 mg/dL (ref 8.9–10.3)
GFR calc Af Amer: >60 mL/min (ref >60)
Glucose, Bld: 100 mg/dL — ABNORMAL HIGH (ref 65–99)
Potassium: 3.8 mmol/L (ref 3.5–5.1)
Sodium: 136 mmol/L (ref 135–145)
Total Bilirubin: 0.7 mg/dL (ref 0.3–1.2)
Total Protein: 6.3 g/dL — ABNORMAL LOW (ref 6.5–8.1)

## 2015-02-19 LAB — CBC WITH DIFFERENTIAL/PLATELET
Basophils Absolute: 0 10*3/uL (ref 0.0–0.1)
Basophils Relative: 0 % (ref 0–1)
EOS ABS: 0.8 10*3/uL — AB (ref 0.0–0.7)
Eosinophils Relative: 9 % — ABNORMAL HIGH (ref 0–5)
HEMATOCRIT: 41.3 % (ref 36.0–46.0)
Hemoglobin: 14.2 g/dL (ref 12.0–15.0)
LYMPHS ABS: 2.6 10*3/uL (ref 0.7–4.0)
Lymphocytes Relative: 31 % (ref 12–46)
MCH: 32 pg (ref 26.0–34.0)
MCHC: 34.4 g/dL (ref 30.0–36.0)
MCV: 93 fL (ref 78.0–100.0)
MONO ABS: 0.4 10*3/uL (ref 0.1–1.0)
MONOS PCT: 4 % (ref 3–12)
NEUTROS ABS: 4.5 10*3/uL (ref 1.7–7.7)
Neutrophils Relative %: 56 % (ref 43–77)
Platelets: 233 10*3/uL (ref 150–400)
RBC: 4.44 MIL/uL (ref 3.87–5.11)
RDW: 14 % (ref 11.5–15.5)
WBC: 8.3 10*3/uL (ref 4.0–10.5)

## 2015-02-19 LAB — I-STAT TROPONIN, ED: TROPONIN I, POC: 0 ng/mL (ref 0.00–0.08)

## 2015-02-19 MED ORDER — HYDROCODONE-ACETAMINOPHEN 10-325 MG PO TABS: ORAL_TABLET | ORAL | Status: DC

## 2015-02-19 NOTE — ED Notes (Signed)
Patient called but had to go to the bathroom first.   Patient currently in restroom.

## 2015-02-19 NOTE — ED Notes (Signed)
Pt presents to department for evaluation of palpitations. Onset Monday. Reports feeling of heart "racing." denies chest pain upon arrival. Respirations unlabored. Pt is alert and oriented x4.

## 2015-02-19 NOTE — ED Notes (Signed)
Pt returned to room  

## 2015-02-19 NOTE — Discharge Instructions (Signed)
Your palpitations are coming from premature atrial contractions. Occasionally patients feel this as a fluttering or racing heart rate. Avoid caffeine, nicotine, stress, dehydration, or cold/sinus medications which can worsen symptoms. Follow up with your regular doctor for ongoing medical care, and follow up with cardiologist as needed for persistent symptoms. Return to the ER for changes or worsening symptoms.   Palpitations A palpitation is the feeling that your heartbeat is irregular. It may feel like your heart is fluttering or skipping a beat. It may also feel like your heart is beating faster than normal. This is usually not a serious problem. In some cases, you may need more medical tests. HOME CARE  Avoid:  Caffeine in coffee, tea, soft drinks, diet pills, and energy drinks.  Chocolate.  Alcohol.  Stop smoking if you smoke.  Reduce your stress and anxiety. Try:  A method that measures bodily functions so you can learn to control them (biofeedback).  Yoga.  Meditation.  Physical activity such as swimming, jogging, or walking.  Get plenty of rest and sleep. GET HELP IF:  Your fast or irregular heartbeat continues after 24 hours.  Your palpitations occur more often. GET HELP RIGHT AWAY IF:   You have chest pain.  You feel short of breath.  You have a very bad headache.  You feel dizzy or pass out (faint). MAKE SURE YOU:   Understand these instructions.  Will watch your condition.  Will get help right away if you are not doing well or get worse. Document Released: 04/28/2008 Document Revised: 12/04/2013 Document Reviewed: 09/18/2011 [facility] Patient Information 2015 [place], [place]. This information is not intended to replace advice given to you by your health care provider. Make sure you discuss any questions you have with your health care provider.  Premature Beats A premature beat is an extra heartbeat that happens earlier than normal. Premature beats are  called premature atrial contractions (PACs) or premature ventricular contractions (PVCs) depending on the area of the heart where they start. CAUSES  Premature beats may be brought on by a variety of factors including:  Emotional stress.  Lack of sleep.  Caffeine.  Asthma medicines.  Stimulants.  Herbal teas.  Dietary supplements.  Alcohol. In most cases, premature beats are not dangerous and are not a sign of serious heart disease. Most patients evaluated for premature beats have completely normal heart function. Rarely, premature beats may be a sign of more significant heart problems or medical illness. SYMPTOMS  Premature beats may cause palpitations. This means you feel like your heart is skipping a beat or beating harder than usual. Sometimes, slight chest pain occurs with premature beats, lasting only a few seconds. This pain has been described as a "flopping" feeling inside the chest. In many cases, premature beats do not cause any symptoms and they are only detected when an electrocardiography test (EKG) or heart monitoring is performed. DIAGNOSIS  Your caregiver may run some tests to evaluate your heart such as an EKG or echocardiography. You may need to wear a portable heart monitor for several days to record the electrical activity of your heart. Blood testing may also be performed to check your electrolytes and thyroid function. TREATMENT  Premature beats usually go away with rest. If the problem continues, your caregiver will determine a treatment plan for you.  HOME CARE INSTRUCTIONS  Get plenty of rest over the next few days until your symptoms improve.  Avoid coffee, tea, alcohol, and soda (pop, cola).  Do not smoke. SEEK  MEDICAL CARE IF:  Your symptoms continue after 1 to 2 days of rest.  You have new symptoms, such as chest pain or trouble breathing. SEEK IMMEDIATE MEDICAL CARE IF:  You have severe chest pain or abdominal pain.  You have pain that radiates  into the neck, arm, or jaw.  You faint or have extreme weakness.  You have shortness of breath.  Your heartbeat races for more than 5 seconds. MAKE SURE YOU:  Understand these instructions.  Will watch your condition.  Will get help right away if you are not doing well or get worse. Document Released: 08/27/2004 Document Revised: 10/12/2011 Document Reviewed: 03/23/2011 [facility] Patient Information 2015 [place], [place]. This information is not intended to replace advice given to you by your health care provider. Make sure you discuss any questions you have with your health care provider.

## 2015-02-19 NOTE — Progress Notes (Signed)
Subjective:    Patient ID: Elizabeth Torres, female    DOB: Aug 18, 1975, 39 y.o.   MRN: [id number]  HPI: [name] is a 39 year old female who returns for follow up for chronic pain and medication refill. She says her pain is located in her lower back and lower extremities. She rates her pain 3. Her current exercise regime is walking is performing stretching and strengthening exercises. She's working 40 hours a week. Also states yesterday she was having palpations she didn't call her PCP they subsided. As she was driving to office this morning they returned she denies chest pain or SOB. She refuses ED at [facility] she states she will go to her PCP office today Dr. Ronnald Ramp in [place]. Instructed to call office after seeing Dr. Ronnald Ramp she verbalizes understanding.  Pain Inventory Average Pain 4 Pain Right Now 3 My pain is constant, dull and aching  In the last 24 hours, has pain interfered with the following? General activity 3 Relation with others 3 Enjoyment of life 2 What TIME of day is your pain at its worst? evening Sleep (in general) Fair  Pain is worse with: sitting, inactivity and standing Pain improves with: rest and medication Relief from Meds: 5  Mobility walk without assistance Do you have any goals in this area?  no  Function Do you have any goals in this area?  no  Neuro/Psych No problems in this area  Prior Studies Any changes since last visit?  no  Physicians involved in your care Any changes since last visit?  no   Family History  Problem Relation Age of Onset  . Diabetes Mother   . Hypertension Mother   . Cancer Paternal Uncle    History   Social History  . Marital Status: Married    Spouse Name: N/A  . Number of Children: N/A  . Years of Education: N/A   Social History Main Topics  . Smoking status: Current Every Day Smoker -- 1.00 packs/day    Types: Cigarettes  . Smokeless tobacco: Never Used  . Alcohol Use: No  . Drug Use: Not on  file  . Sexual Activity: Not on file   Other Topics Concern  . None   Social History Narrative   Past Surgical History  Procedure Laterality Date  . Spine surgery  2008    Dr Joya Salm  . Mastectomy Right     partial with lymph node dissection  . Breast surgery     Past Medical History  Diagnosis Date  . Cancer    BP 134/86 mmHg  Pulse 83  SpO2 97%  Opioid Risk Score:   Fall Risk Score:  `1  Depression screen PHQ 2/9  Depression screen [facility] 2/9 02/19/2015 11/14/2014  Decreased Interest 0 0  Down, Depressed, Hopeless 0 0  PHQ - 2 Score 0 0  Altered sleeping 1 1  Tired, decreased energy 1 1  Change in appetite 0 0  Feeling bad or failure about yourself  0 0  Trouble concentrating 0 0  Moving slowly or fidgety/restless 0 0  Suicidal thoughts 0 0  PHQ-9 Score 2 2     Review of Systems  All other systems reviewed and are negative.      Objective:   Physical Exam  Constitutional: She is oriented to person, place, and time. She appears well-developed and well-nourished.  HENT:  Head: Normocephalic and atraumatic.  Neck: Normal range of motion. Neck supple.  Cardiovascular:  [id number]  Pulmonary/Chest: Effort normal and breath sounds normal.  Musculoskeletal:  Normal Muscle Bulk and Muscle Testing Reveals: Upper Extremities: Full ROM and Muscle Strength 5/5 Back without Spinal or Paraspinal Tenderness Lower Extremities: Full ROM and Muscle Strength 5/5 Arises from chair with ease Narrow Based Gait   Neurological: She is alert and oriented to person, place, and time.  Skin: Skin is warm and dry.  Psychiatric: She has a normal mood and affect.  Nursing note and vitals reviewed.         Assessment & Plan:  1. Lumbar postlaminectomy syndrome status post L5-S1 fusion with chronic S1 radiculopathy  Refilled: Hydrocodone 10/325 mg #120--one every 6 hours as needed for pain. 2. 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   15 minutes of face to face time was spent during this visit. All questions were encouraged and answered.  F/U in 65month

## 2015-02-19 NOTE — ED Notes (Signed)
Pt transported to xray 

## 2015-02-19 NOTE — ED Provider Notes (Signed)
CSN: [id number]     Arrival date & time 02/19/15  0931 History   First MD Initiated Contact with Patient 02/19/15 [phone]     Chief Complaint  Patient presents with  . Palpitations     (Consider location/radiation/quality/duration/timing/severity/associated sxs/prior Treatment) HPI Comments: Elizabeth Torres is a 39 y.o. female with a PMHx of chemotherapy induced polyneuropathy, breast cancer s/p mastectomy, XRT, and chemo, and chronic pain/back pain s/p left L5-S1 laminectomy and diskectomy for herniated nucleus pulposus and L5-S1 fusion (controlled substances contract with [facility]), who presents to the ED with complaints of palpitations that began yesterday morning upon awakening. She describes a sensation of her heart racing and fluttering for several seconds, intermittently throughout the day, with no known context, worsens with taking deep breaths and being in the heat, and with no known alleviating factors given that she has not tried anything prior to arrival. She denies any caffeine use, hyperventilation, dehydration, changes in medications, changes with exertion, change with smoking, anxiety/stress, or recent infection. She is a smoker but denies that her symptoms are associated with nicotine use. She denies any lightheadedness, dizziness, fevers, chills, chest pain, shortness of breath, URI symptoms, cough, leg swelling, recent travel/surgery/immobilization, estrogen use, abdominal pain, nausea, vomiting, diarrhea, constipation, melena, hematochezia, dysuria, hematuria, vaginal bleeding or discharge, numbness, tingling, or weakness. She has a family history of mitral valve prolapse and her daughter, but no other cardiac history in herself or in her family. Denies any illicit drug or chronic alcohol use.  Patient is a 39 y.o. female presenting with palpitations. The history is provided by the patient. No language interpreter was used.  Palpitations Palpitations quality:  Fast Onset quality:  At  rest Duration:  1 day Timing:  Intermittent Progression:  Unchanged Chronicity:  New Context: not anxiety, not caffeine, not hyperventilation, not illicit drugs, not nicotine and not stimulant use   Relieved by:  None tried Exacerbated by: taking deep breaths and being outside in the heat. Ineffective treatments:  None tried Associated symptoms: no chest pain, no chest pressure, no diaphoresis, no dizziness, no lower extremity edema, no nausea, no numbness, no shortness of breath, no vomiting and no weakness   Risk factors: no hx of atrial fibrillation, no hx of DVT and no stress     Past Medical History  Diagnosis Date  . Cancer    Past Surgical History  Procedure Laterality Date  . Spine surgery  2008    Dr Joya Salm  . Mastectomy Right     partial with lymph node dissection  . Breast surgery     Family History  Problem Relation Age of Onset  . Diabetes Mother   . Hypertension Mother   . Cancer Paternal Uncle    History  Substance Use Topics  . Smoking status: Current Every Day Smoker -- 1.00 packs/day    Types: Cigarettes  . Smokeless tobacco: Never Used  . Alcohol Use: No   OB History    No data available     Review of Systems  Constitutional: Negative for fever, chills and diaphoresis.  Respiratory: Negative for shortness of breath.   Cardiovascular: Positive for palpitations. Negative for chest pain and leg swelling.  Gastrointestinal: Negative for nausea, vomiting, abdominal pain, diarrhea, constipation and blood in stool.  Genitourinary: Negative for dysuria, hematuria, vaginal bleeding and vaginal discharge.  Musculoskeletal: Negative for myalgias and arthralgias.  Skin: Negative for color change.  Allergic/Immunologic: Negative for immunocompromised state.  Neurological: Negative for dizziness, weakness, light-headedness and numbness.  Psychiatric/Behavioral: Negative for confusion.   10 Systems reviewed and are negative for acute change except as noted in  the HPI.    Allergies  Penicillins; Sulfa antibiotics; Neurontin; and Zofran  Home Medications   Prior to Admission medications   Medication Sig Start Date End Date Taking? Authorizing Provider  HYDROcodone-acetaminophen (NORCO) 10-325 MG per tablet TAKE 1 TABLET BY MOUTH EVERY 6 HOURS AS NEEDED FOR PAIN 02/19/15   Bayard Hugger, NP  ibuprofen (ADVIL,MOTRIN) 800 MG tablet 4 (four) times daily - after meals and at bedtime. TAKE 1 TABLET BY MOUTH 3 TIMES A DAY 04/19/13   Aundria Mems, PA-C  nortriptyline (PAMELOR) 10 MG capsule Take 1 capsule (10 mg total) by mouth at bedtime. 12/14/14   Charlett Blake, MD  zolpidem (AMBIEN) 5 MG tablet TAKE 1 TABLET BY MOUTH AT BEDTIME AS NEEDED FOR SLEEP 01/02/15   Bayard Hugger, NP   BP 157/86 mmHg  Pulse 95  Temp(Src) 97.9 F (36.6 C) (Oral)  Resp 18  Ht 5\' 3"  (1.6 m)  Wt 150 lb (68.04 kg)  BMI 26.58 kg/m2  SpO2 98% Physical Exam  Constitutional: She is oriented to person, place, and time. Vital signs are normal. She appears well-developed and well-nourished.  Non-toxic appearance. No distress.  Afebrile, nontoxic, NAD  HENT:  Head: Normocephalic and atraumatic.  Mouth/Throat: Oropharynx is clear and moist and mucous membranes are normal.  Eyes: Conjunctivae and EOM are normal. Right eye exhibits no discharge. Left eye exhibits no discharge.  Neck: Normal range of motion. Neck supple.  Cardiovascular: Normal rate, regular rhythm, normal heart sounds and intact distal pulses.  Exam reveals no gallop and no friction rub.   No murmur heard. RRR, nl s1/s2, no m/r/g, distal pulses intact, no pedal edema   Pulmonary/Chest: Effort normal and breath sounds normal. No respiratory distress. She has no decreased breath sounds. She has no wheezes. She has no rhonchi. She has no rales. She exhibits no tenderness, no crepitus and no deformity.  CTAB in all lung fields, no w/r/r, no hypoxia or increased WOB, speaking in full sentences, SpO2 98% on RA No  chest wall tenderness, crepitus, or deformity  Abdominal: Soft. Normal appearance and bowel sounds are normal. She exhibits no distension. There is no tenderness. There is no rigidity, no rebound and no guarding.  Musculoskeletal: Normal range of motion.  MAE x4 Strength and sensation grossly intact Distal pulses intact No pedal edema  Neurological: She is alert and oriented to person, place, and time. She has normal strength. No sensory deficit.  Skin: Skin is warm, dry and intact. No rash noted.  Psychiatric: She has a normal mood and affect.  Nursing note and vitals reviewed.   ED Course  Procedures (including critical care time) Labs Review Labs Reviewed  CBC WITH DIFFERENTIAL/PLATELET - Abnormal; Notable for the following:    Eosinophils Relative 9 (*)    Eosinophils Absolute 0.8 (*)    All other components within normal limits  COMPREHENSIVE METABOLIC PANEL - Abnormal; Notable for the following:    Glucose, Bld 100 (*)    Total Protein 6.3 (*)    All other components within normal limits  I-STAT TROPOININ, ED    Imaging Review Dg Chest 2 View  02/19/2015   CLINICAL DATA:  Chest pain, palpitation, history of breast cancer  EXAM: CHEST  2 VIEW  COMPARISON:  09/15/2013  FINDINGS: Cardiomediastinal silhouette is stable. No acute infiltrate or pleural effusion. No pulmonary edema. Left subclavian  Port-A-Cath is stable in position. Bony thorax is unremarkable.  IMPRESSION: No active cardiopulmonary disease.   Electronically Signed   By: Lahoma Crocker M.D.   On: 02/19/2015 10:07     EKG Interpretation   Date/Time:  Tuesday February 19 2015 09:41:45 EDT Ventricular Rate:  97 PR Interval:  128 QRS Duration: 80 QT Interval:  360 QTC Calculation: 457 R Axis:   81 Text Interpretation:  Normal sinus rhythm Possible Left atrial enlargement  Borderline ECG No old tracing to compare Confirmed by CAMPOS  MD, KEVIN  ([id number]) on 02/19/2015 10:51:49 AM      MDM   Final diagnoses:   Intermittent palpitations  PAC (premature atrial contraction)    39 y.o. female here with sensation of palpitations intermittently since yesterday. No known inciting factor aside from taking deep breaths, denies CP/SOB/lightheadedness. Will obtain labs, EKG, CXR, and re-assess shortly. Will attempt to capture EKG strip when symptoms occur, currently asymptomatic.  10:58 AM Trop neg. EKG with LAE but otherwise WNL. CBC and CMP WNL. CXR clear. Able to capture on monitor a few PACs at the time of her symptoms. Discussed lifestyle modifications to help avoid this. Will have her f/up with PCP in 1wk to recheck, but gave her [facility] f/up PRN for persistent symptoms. I explained the diagnosis and have given explicit precautions to return to the ER including for any other new or worsening symptoms. The patient understands and accepts the medical plan as it's been dictated and I have answered their questions. Discharge instructions concerning home care and prescriptions have been given. The patient is STABLE and is discharged to home in good condition.  BP 157/86 mmHg  Pulse 95  Temp(Src) 97.9 F (36.6 C) (Oral)  Resp 18  Ht 5\' 3"  (1.6 m)  Wt 150 lb (68.04 kg)  BMI 26.58 kg/m2  SpO2 98%    Kaliopi Blyden Camprubi-Soms, PA-C 02/19/15 [place], MD 02/19/15 1115

## 2015-02-27 ENCOUNTER — Other Ambulatory Visit: Payer: Self-pay | Admitting: Registered Nurse

## 2015-03-19 ENCOUNTER — Encounter: Payer: 59 | Admitting: Registered Nurse

## 2015-03-20 ENCOUNTER — Encounter: Payer: Self-pay | Admitting: Registered Nurse

## 2015-03-20 ENCOUNTER — Encounter: Payer: 59 | Attending: Physical Medicine and Rehabilitation | Admitting: Registered Nurse

## 2015-03-20 VITALS — BP 125/86 | HR 75 | Resp 14

## 2015-03-20 DIAGNOSIS — G894 Chronic pain syndrome: Secondary | ICD-10-CM | POA: Diagnosis not present

## 2015-03-20 DIAGNOSIS — Z5181 Encounter for therapeutic drug level monitoring: Secondary | ICD-10-CM | POA: Diagnosis not present

## 2015-03-20 DIAGNOSIS — T451X5S Adverse effect of antineoplastic and immunosuppressive drugs, sequela: Secondary | ICD-10-CM | POA: Diagnosis not present

## 2015-03-20 DIAGNOSIS — M961 Postlaminectomy syndrome, not elsewhere classified: Secondary | ICD-10-CM | POA: Insufficient documentation

## 2015-03-20 DIAGNOSIS — F17210 Nicotine dependence, cigarettes, uncomplicated: Secondary | ICD-10-CM | POA: Diagnosis not present

## 2015-03-20 DIAGNOSIS — M79605 Pain in left leg: Secondary | ICD-10-CM | POA: Insufficient documentation

## 2015-03-20 DIAGNOSIS — G8929 Other chronic pain: Secondary | ICD-10-CM | POA: Insufficient documentation

## 2015-03-20 DIAGNOSIS — Z9221 Personal history of antineoplastic chemotherapy: Secondary | ICD-10-CM | POA: Diagnosis not present

## 2015-03-20 DIAGNOSIS — M545 Low back pain: Secondary | ICD-10-CM | POA: Insufficient documentation

## 2015-03-20 DIAGNOSIS — Z760 Encounter for issue of repeat prescription: Secondary | ICD-10-CM | POA: Insufficient documentation

## 2015-03-20 DIAGNOSIS — G620 Drug-induced polyneuropathy: Secondary | ICD-10-CM | POA: Insufficient documentation

## 2015-03-20 DIAGNOSIS — Z79899 Other long term (current) drug therapy: Secondary | ICD-10-CM

## 2015-03-20 DIAGNOSIS — T451X5A Adverse effect of antineoplastic and immunosuppressive drugs, initial encounter: Secondary | ICD-10-CM

## 2015-03-20 DIAGNOSIS — M79604 Pain in right leg: Secondary | ICD-10-CM | POA: Insufficient documentation

## 2015-03-20 MED ORDER — HYDROCODONE-ACETAMINOPHEN 10-325 MG PO TABS: ORAL_TABLET | ORAL | Status: DC

## 2015-03-20 NOTE — Progress Notes (Signed)
Subjective:    Patient ID: Elizabeth Torres, female    DOB: Jul 03, 1976, 39 y.o.   MRN: [id number]  HPI: Elizabeth Torres is a 39 year old female who returns for follow up for chronic pain and medication refill. She says her pain is located in her lower back and lower extremities. She rates her pain 4. Her current exercise regime is walking is performing stretching and strengthening exercises. She's working 40 hours a week.  Went to [employer] on 02/19/15 for palpitations. She was diagnosed with Intermittent palpitations ( PAC) PCP following.  Pain Inventory Average Pain 4 Pain Right Now 4 My pain is constant, dull and aching  In the last 24 hours, has pain interfered with the following? General activity 2 Relation with others 3 Enjoyment of life 3 What TIME of day is your pain at its worst? evening Sleep (in general) Fair  Pain is worse with: sitting, inactivity and standing Pain improves with: rest and medication Relief from Meds: 5  Mobility Do you have any goals in this area?  no  Function Do you have any goals in this area?  no  Neuro/Psych No problems in this area  Prior Studies Any changes since last visit?  no  Physicians involved in your care Any changes since last visit?  no   Family History  Problem Relation Age of Onset  . Diabetes Mother   . Hypertension Mother   . Cancer Paternal Uncle    Social History   Social History  . Marital Status: Married    Spouse Name: N/A  . Number of Children: N/A  . Years of Education: N/A   Social History Main Topics  . Smoking status: Current Every Day Smoker -- 1.00 packs/day    Types: Cigarettes  . Smokeless tobacco: Never Used  . Alcohol Use: No  . Drug Use: None  . Sexual Activity: Not Asked   Other Topics Concern  . None   Social History Narrative   Past Surgical History  Procedure Laterality Date  . Spine surgery  2008    Dr Joya Salm  . Mastectomy Right     partial with lymph node dissection  .  Breast surgery     Past Medical History  Diagnosis Date  . Cancer    BP 125/86 mmHg  Pulse 75  Resp 14  SpO2 98%  Opioid Risk Score:   Fall Risk Score:  `1  Depression screen PHQ 2/9  Depression screen [facility] 2/9 02/19/2015 11/14/2014  Decreased Interest 0 0  Down, Depressed, Hopeless 0 0  PHQ - 2 Score 0 0  Altered sleeping 1 1  Tired, decreased energy 1 1  Change in appetite 0 0  Feeling bad or failure about yourself  0 0  Trouble concentrating 0 0  Moving slowly or fidgety/restless 0 0  Suicidal thoughts 0 0  PHQ-9 Score 2 2     Review of Systems  All other systems reviewed and are negative.      Objective:   Physical Exam  Constitutional: She is oriented to person, place, and time. She appears well-developed and well-nourished.  HENT:  Head: Normocephalic and atraumatic.  Neck: Normal range of motion. Neck supple.  Cardiovascular: Normal rate and regular rhythm.   Pulmonary/Chest: Effort normal and breath sounds normal.  Musculoskeletal:  Normal Muscle Bulk and Muscle Testing Reveals: Upper Extremities: Full ROM and Muscle Strength 5/5 Back without spinal or paraspinal tenderness Lower Extremities: Full ROM and Muscle Strength  5/5 Arises from chair with ease Narrow Based gait  Neurological: She is alert and oriented to person, place, and time.  Skin: Skin is warm and dry.  Psychiatric: She has a normal mood and affect.  Nursing note and vitals reviewed.         Assessment & Plan:  1. Lumbar postlaminectomy syndrome status post L5-S1 fusion with chronic S1 radiculopathy  Refilled: Hydrocodone 10/325 mg #120--one every 6 hours as needed for pain. 2. 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   15 minutes of face to face time was spent during this visit. All questions were encouraged and answered.  F/U in 2month

## 2015-04-17 ENCOUNTER — Encounter: Payer: Self-pay | Admitting: Registered Nurse

## 2015-04-17 ENCOUNTER — Other Ambulatory Visit: Payer: Self-pay | Admitting: Physical Medicine & Rehabilitation

## 2015-04-17 ENCOUNTER — Encounter: Payer: 59 | Attending: Physical Medicine and Rehabilitation | Admitting: Registered Nurse

## 2015-04-17 VITALS — BP 128/86 | HR 90

## 2015-04-17 DIAGNOSIS — G620 Drug-induced polyneuropathy: Secondary | ICD-10-CM | POA: Diagnosis not present

## 2015-04-17 DIAGNOSIS — Z79899 Other long term (current) drug therapy: Secondary | ICD-10-CM

## 2015-04-17 DIAGNOSIS — M545 Low back pain: Secondary | ICD-10-CM | POA: Insufficient documentation

## 2015-04-17 DIAGNOSIS — Z5181 Encounter for therapeutic drug level monitoring: Secondary | ICD-10-CM | POA: Diagnosis not present

## 2015-04-17 DIAGNOSIS — F17210 Nicotine dependence, cigarettes, uncomplicated: Secondary | ICD-10-CM | POA: Diagnosis not present

## 2015-04-17 DIAGNOSIS — M79604 Pain in right leg: Secondary | ICD-10-CM | POA: Diagnosis not present

## 2015-04-17 DIAGNOSIS — T451X5A Adverse effect of antineoplastic and immunosuppressive drugs, initial encounter: Secondary | ICD-10-CM

## 2015-04-17 DIAGNOSIS — Z9221 Personal history of antineoplastic chemotherapy: Secondary | ICD-10-CM | POA: Diagnosis not present

## 2015-04-17 DIAGNOSIS — M79605 Pain in left leg: Secondary | ICD-10-CM | POA: Insufficient documentation

## 2015-04-17 DIAGNOSIS — M961 Postlaminectomy syndrome, not elsewhere classified: Secondary | ICD-10-CM | POA: Insufficient documentation

## 2015-04-17 DIAGNOSIS — T451X5S Adverse effect of antineoplastic and immunosuppressive drugs, sequela: Secondary | ICD-10-CM | POA: Diagnosis not present

## 2015-04-17 DIAGNOSIS — G8929 Other chronic pain: Secondary | ICD-10-CM | POA: Diagnosis not present

## 2015-04-17 DIAGNOSIS — G894 Chronic pain syndrome: Secondary | ICD-10-CM

## 2015-04-17 DIAGNOSIS — Z760 Encounter for issue of repeat prescription: Secondary | ICD-10-CM | POA: Diagnosis present

## 2015-04-17 MED ORDER — HYDROCODONE-ACETAMINOPHEN 10-325 MG PO TABS: ORAL_TABLET | ORAL | Status: DC

## 2015-04-17 NOTE — Progress Notes (Signed)
Subjective:    Patient ID: Elizabeth Torres, female    DOB: 1975-09-19, 39 y.o.   MRN: [id number]  HPI: Elizabeth Torres is a 39 year old female who returns for follow up for chronic pain and medication refill. She says her pain is located in her lower back and lower extremities. She rates her pain 4. Her current exercise regime is walking is performing stretching and strengthening exercises. She's working 40 hours a week.  Pain Inventory Average Pain 4 Pain Right Now 4 My pain is constant, dull and aching  In the last 24 hours, has pain interfered with the following? General activity 4 Relation with others 3 Enjoyment of life 3 What TIME of day is your pain at its worst? evening Sleep (in general) Fair  Pain is worse with: sitting and inactivity Pain improves with: rest and medication Relief from Meds: no answer  Mobility Do you have any goals in this area?  no  Function Do you have any goals in this area?  no  Neuro/Psych No problems in this area  Prior Studies Any changes since last visit?  no  Physicians involved in your care Any changes since last visit?  no   Family History  Problem Relation Age of Onset  . Diabetes Mother   . Hypertension Mother   . Cancer Paternal Uncle    Social History   Social History  . Marital Status: Married    Spouse Name: N/A  . Number of Children: N/A  . Years of Education: N/A   Social History Main Topics  . Smoking status: Current Every Day Smoker -- 1.00 packs/day    Types: Cigarettes  . Smokeless tobacco: Never Used  . Alcohol Use: No  . Drug Use: None  . Sexual Activity: Not Asked   Other Topics Concern  . None   Social History Narrative   Past Surgical History  Procedure Laterality Date  . Spine surgery  2008    Dr Joya Salm  . Mastectomy Right     partial with lymph node dissection  . Breast surgery     Past Medical History  Diagnosis Date  . Cancer    BP 128/86 mmHg  Pulse 90  SpO2 99%  Opioid  Risk Score:   Fall Risk Score:  `1  Depression screen PHQ 2/9  Depression screen [facility] 2/9 04/17/2015 02/19/2015 11/14/2014  Decreased Interest 0 0 0  Down, Depressed, Hopeless 0 0 0  PHQ - 2 Score 0 0 0  Altered sleeping - 1 1  Tired, decreased energy - 1 1  Change in appetite - 0 0  Feeling bad or failure about yourself  - 0 0  Trouble concentrating - 0 0  Moving slowly or fidgety/restless - 0 0  Suicidal thoughts - 0 0  PHQ-9 Score - 2 2     Review of Systems  Musculoskeletal: Positive for back pain.  All other systems reviewed and are negative.      Objective:   Physical Exam  Constitutional: She is oriented to person, place, and time. She appears well-developed and well-nourished.  HENT:  Head: Normocephalic and atraumatic.  Neck: Normal range of motion. Neck supple.  Cardiovascular: Normal rate and regular rhythm.   Pulmonary/Chest: Effort normal and breath sounds normal.  Musculoskeletal:  Normal Muscle Bulk and Muscle Testing Reveals:  Upper Extremities: Full ROM and Muscle Strength 5/5 Back without spinal or paraspinal tenderness Lower Extremities: Full ROM and Muscle Strength 5/5 Arises from  chair with ease Narrow Based Gait   Neurological: She is alert and oriented to person, place, and time.  Skin: Skin is warm and dry.  Nursing note and vitals reviewed.         Assessment & Plan:  1. Lumbar postlaminectomy syndrome status post L5-S1 fusion with chronic S1 radiculopathy  Refilled: Hydrocodone 10/325 mg #120--one every 6 hours as needed for pain. 2. 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   15 minutes of face to face time was spent during this visit. All questions were encouraged and answered.  F/U in 26month

## 2015-04-18 LAB — PMP ALCOHOL METABOLITE (ETG): ETGU: NEGATIVE ng/mL

## 2015-04-22 LAB — OPIATES/OPIOIDS (LC/MS-MS)
CODEINE URINE: NEGATIVE ng/mL (ref <50)
HYDROCODONE: 556 ng/mL (ref <50)
Hydromorphone: NEGATIVE ng/mL — AB (ref <50)
Morphine Urine: NEGATIVE ng/mL (ref <50)
NOROXYCODONE, UR: NEGATIVE ng/mL (ref <50)
Norhydrocodone, Ur: 547 ng/mL (ref <50)
Oxycodone, ur: NEGATIVE ng/mL (ref <50)
Oxymorphone: NEGATIVE ng/mL (ref <50)

## 2015-04-23 LAB — PRESCRIPTION MONITORING PROFILE (SOLSTAS)
AMPHETAMINE/METH: NEGATIVE ng/mL
BUPRENORPHINE, URINE: NEGATIVE ng/mL
Barbiturate Screen, Urine: NEGATIVE ng/mL
Benzodiazepine Screen, Urine: NEGATIVE ng/mL
CARISOPRODOL, URINE: NEGATIVE ng/mL
CREATININE, URINE: 12.15 mg/dL — AB (ref >20.0)
Cannabinoid Scrn, Ur: NEGATIVE ng/mL
Cocaine Metabolites: NEGATIVE ng/mL
ECSTASY: NEGATIVE ng/mL
Fentanyl, Ur: NEGATIVE ng/mL
MEPERIDINE UR: NEGATIVE ng/mL
Methadone Screen, Urine: NEGATIVE ng/mL
Nitrites, Initial: NEGATIVE ug/mL
Oxycodone Screen, Ur: NEGATIVE ng/mL
PH URINE, INITIAL: 5.1 pH (ref 4.5–8.9)
Propoxyphene: NEGATIVE ng/mL
Tapentadol, urine: NEGATIVE ng/mL
Tramadol Scrn, Ur: NEGATIVE ng/mL
ZOLPIDEM, URINE: NEGATIVE ng/mL

## 2015-04-25 ENCOUNTER — Other Ambulatory Visit: Payer: Self-pay | Admitting: Registered Nurse

## 2015-05-01 NOTE — Progress Notes (Signed)
Urine drug screen for this encounter is consistent for prescribed medication 

## 2015-05-05 ENCOUNTER — Other Ambulatory Visit: Payer: Self-pay | Admitting: Physical Medicine & Rehabilitation

## 2015-05-14 ENCOUNTER — Encounter: Payer: 59 | Attending: Physical Medicine and Rehabilitation | Admitting: Registered Nurse

## 2015-05-14 ENCOUNTER — Encounter: Payer: Self-pay | Admitting: Registered Nurse

## 2015-05-14 VITALS — BP 140/88 | HR 89

## 2015-05-14 DIAGNOSIS — M79605 Pain in left leg: Secondary | ICD-10-CM | POA: Insufficient documentation

## 2015-05-14 DIAGNOSIS — M961 Postlaminectomy syndrome, not elsewhere classified: Secondary | ICD-10-CM | POA: Insufficient documentation

## 2015-05-14 DIAGNOSIS — G8929 Other chronic pain: Secondary | ICD-10-CM | POA: Insufficient documentation

## 2015-05-14 DIAGNOSIS — F17210 Nicotine dependence, cigarettes, uncomplicated: Secondary | ICD-10-CM | POA: Diagnosis not present

## 2015-05-14 DIAGNOSIS — Z5181 Encounter for therapeutic drug level monitoring: Secondary | ICD-10-CM

## 2015-05-14 DIAGNOSIS — M545 Low back pain: Secondary | ICD-10-CM | POA: Insufficient documentation

## 2015-05-14 DIAGNOSIS — G894 Chronic pain syndrome: Secondary | ICD-10-CM | POA: Diagnosis not present

## 2015-05-14 DIAGNOSIS — Z760 Encounter for issue of repeat prescription: Secondary | ICD-10-CM | POA: Diagnosis not present

## 2015-05-14 DIAGNOSIS — M79604 Pain in right leg: Secondary | ICD-10-CM | POA: Insufficient documentation

## 2015-05-14 DIAGNOSIS — G620 Drug-induced polyneuropathy: Secondary | ICD-10-CM

## 2015-05-14 DIAGNOSIS — T451X5S Adverse effect of antineoplastic and immunosuppressive drugs, sequela: Secondary | ICD-10-CM | POA: Diagnosis not present

## 2015-05-14 DIAGNOSIS — T451X5A Adverse effect of antineoplastic and immunosuppressive drugs, initial encounter: Secondary | ICD-10-CM

## 2015-05-14 DIAGNOSIS — Z9221 Personal history of antineoplastic chemotherapy: Secondary | ICD-10-CM | POA: Diagnosis not present

## 2015-05-14 DIAGNOSIS — Z79899 Other long term (current) drug therapy: Secondary | ICD-10-CM

## 2015-05-14 MED ORDER — HYDROCODONE-ACETAMINOPHEN 10-325 MG PO TABS: ORAL_TABLET | ORAL | Status: DC

## 2015-05-14 NOTE — Progress Notes (Signed)
Subjective:    Patient ID: Elizabeth Torres, female    DOB: [birth year], 39 y.o.   MRN: [id number]  HPI: [name] is a 39 year old female who returns for follow up for chronic pain and medication refill. She says her pain is located in her lower back radiating into her lower extremities posteriorly. She rates her pain 3. Her current exercise regime is walking. She's working 40 hours a week.  Pain Inventory Average Pain 4 Pain Right Now 3 My pain is constant, dull and aching  In the last 24 hours, has pain interfered with the following? General activity 3 Relation with others 3 Enjoyment of life 2 What TIME of day is your pain at its worst? evening Sleep (in general) Fair  Pain is worse with: sitting, inactivity and standing Pain improves with: rest and medication Relief from Meds: 5  Mobility walk without assistance  Function Do you have any goals in this area?  no  Neuro/Psych No problems in this area  Prior Studies Any changes since last visit?  no  Physicians involved in your care Any changes since last visit?  no   Family History  Problem Relation Age of Onset  . Diabetes Mother   . Hypertension Mother   . Cancer Paternal Uncle    Social History   Social History  . Marital Status: Married    Spouse Name: N/A  . Number of Children: N/A  . Years of Education: N/A   Social History Main Topics  . Smoking status: Current Every Day Smoker -- 1.00 packs/day    Types: Cigarettes  . Smokeless tobacco: Never Used  . Alcohol Use: No  . Drug Use: None  . Sexual Activity: Not Asked   Other Topics Concern  . None   Social History Narrative   Past Surgical History  Procedure Laterality Date  . Spine surgery  2008    Dr Joya Salm  . Mastectomy Right     partial with lymph node dissection  . Breast surgery     Past Medical History  Diagnosis Date  . Cancer (HCC)    BP 140/88 mmHg  Pulse 89  SpO2 96%  Opioid Risk Score:   Fall Risk Score:   `1  Depression screen PHQ 2/9  Depression screen [facility] 2/9 04/17/2015 02/19/2015 11/14/2014  Decreased Interest 0 0 0  Down, Depressed, Hopeless 0 0 0  PHQ - 2 Score 0 0 0  Altered sleeping - 1 1  Tired, decreased energy - 1 1  Change in appetite - 0 0  Feeling bad or failure about yourself  - 0 0  Trouble concentrating - 0 0  Moving slowly or fidgety/restless - 0 0  Suicidal thoughts - 0 0  PHQ-9 Score - 2 2     Review of Systems  All other systems reviewed and are negative.      Objective:   Physical Exam  Constitutional: She is oriented to person, place, and time. She appears well-developed and well-nourished.  HENT:  Head: Normocephalic and atraumatic.  Neck: Normal range of motion. Neck supple.  Cardiovascular: Normal rate and regular rhythm.   Pulmonary/Chest: Effort normal and breath sounds normal.  Musculoskeletal:  Normal Muscle Bulk and muscle Testing Reveals: Upper Extremities: Full ROM and Muscle Strength 5/5 Lumbar Paraspinal Tenderness: L-3- L-5 Lower Extremities: Full ROM and Muscle Strength 5/5 Arises from chair with ease Narrow Based Gait  Neurological: She is alert and oriented to person, place, and  time.  Skin: Skin is warm and dry.  Psychiatric: She has a normal mood and affect.  Nursing note and vitals reviewed.         Assessment & Plan:  1. Lumbar postlaminectomy syndrome status post L5-S1 fusion with chronic S1 radiculopathy  Refilled: Hydrocodone 10/325 mg #120--one every 6 hours as needed for pain. 2. 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   15 minutes of face to face time was spent during this visit. All questions were encouraged and answered.  F/U in 73month

## 2015-06-11 ENCOUNTER — Ambulatory Visit (HOSPITAL_BASED_OUTPATIENT_CLINIC_OR_DEPARTMENT_OTHER): Payer: 59 | Admitting: Physical Medicine & Rehabilitation

## 2015-06-11 ENCOUNTER — Encounter: Payer: 59 | Attending: Physical Medicine and Rehabilitation

## 2015-06-11 ENCOUNTER — Encounter: Payer: Self-pay | Admitting: Physical Medicine & Rehabilitation

## 2015-06-11 VITALS — BP 126/87 | HR 86 | Resp 16

## 2015-06-11 DIAGNOSIS — G620 Drug-induced polyneuropathy: Secondary | ICD-10-CM

## 2015-06-11 DIAGNOSIS — Z760 Encounter for issue of repeat prescription: Secondary | ICD-10-CM | POA: Diagnosis not present

## 2015-06-11 DIAGNOSIS — M961 Postlaminectomy syndrome, not elsewhere classified: Secondary | ICD-10-CM

## 2015-06-11 DIAGNOSIS — F17210 Nicotine dependence, cigarettes, uncomplicated: Secondary | ICD-10-CM | POA: Diagnosis not present

## 2015-06-11 DIAGNOSIS — Z9221 Personal history of antineoplastic chemotherapy: Secondary | ICD-10-CM | POA: Diagnosis not present

## 2015-06-11 DIAGNOSIS — T451X5S Adverse effect of antineoplastic and immunosuppressive drugs, sequela: Secondary | ICD-10-CM | POA: Diagnosis not present

## 2015-06-11 DIAGNOSIS — M79604 Pain in right leg: Secondary | ICD-10-CM | POA: Insufficient documentation

## 2015-06-11 DIAGNOSIS — M79605 Pain in left leg: Secondary | ICD-10-CM | POA: Diagnosis not present

## 2015-06-11 DIAGNOSIS — M545 Low back pain: Secondary | ICD-10-CM | POA: Insufficient documentation

## 2015-06-11 DIAGNOSIS — T451X5A Adverse effect of antineoplastic and immunosuppressive drugs, initial encounter: Secondary | ICD-10-CM

## 2015-06-11 DIAGNOSIS — G8929 Other chronic pain: Secondary | ICD-10-CM | POA: Insufficient documentation

## 2015-06-11 MED ORDER — HYDROCODONE-ACETAMINOPHEN 10-325 MG PO TABS: ORAL_TABLET | ORAL | Status: DC

## 2015-06-11 NOTE — Progress Notes (Signed)
Subjective:    Patient ID: Elizabeth Torres, female    DOB: [birth year], 39 y.o.   MRN: [id number] L5-S1 laminectomy and diskectomy  for herniated nucleus pulposus on Dec 13, 2009. She had a recurrent  disk at that same level and underwent an L5-S1 fusion on January 07, 2010.  Interval medical history positive for diagnosis of right breast  carcinoma. She has undergone lumpectomy as well as a partial mastectomy  on July 26, 2011. She had 16 chemotherapy treatments  , followed by radiation therapy 5 days per week  for 7 wks.  Completed radiation sept 25 2013  HPI No new pain concerns Asking about whether the neuropathy pain is permanent. As noted above her chemotherapy was over 3 years ago. Therefore it is unlikely that the neuropathy pain will improve spontaneously. She continues to take nortriptyline for this pain. We discussed other options including capsaicin creme  Her back pain is about the same she has been compliant with her hydrocodone dosage  Pain Inventory Average Pain 4 Pain Right Now 3 My pain is constant, dull and aching  In the last 24 hours, has pain interfered with the following? General activity 3 Relation with others 3 Enjoyment of life 2 What TIME of day is your pain at its worst? evening Sleep (in general) Fair  Pain is worse with: sitting, inactivity and standing Pain improves with: rest and medication Relief from Meds: 5  Mobility Do you have any goals in this area?  no  Function Do you have any goals in this area?  no  Neuro/Psych No problems in this area  Prior Studies Any changes since last visit?  no  Physicians involved in your care Any changes since last visit?  no   Family History  Problem Relation Age of Onset  . Diabetes Mother   . Hypertension Mother   . Cancer Paternal Uncle    Social History   Social History  . Marital Status: Married    Spouse Name: N/A  . Number of Children: N/A  . Years of Education: N/A   Social  History Main Topics  . Smoking status: Current Every Day Smoker -- 1.00 packs/day    Types: Cigarettes  . Smokeless tobacco: Never Used  . Alcohol Use: No  . Drug Use: None  . Sexual Activity: Not Asked   Other Topics Concern  . None   Social History Narrative   Past Surgical History  Procedure Laterality Date  . Spine surgery  2008    Dr Joya Salm  . Mastectomy Right     partial with lymph node dissection  . Breast surgery     Past Medical History  Diagnosis Date  . Cancer (HCC)    BP 126/87 mmHg  Pulse 86  Resp 16  SpO2 99%  Opioid Risk Score:   Fall Risk Score:  `1  Depression screen PHQ 2/9  Depression screen [facility] 2/9 04/17/2015 02/19/2015 11/14/2014  Decreased Interest 0 0 0  Down, Depressed, Hopeless 0 0 0  PHQ - 2 Score 0 0 0  Altered sleeping - 1 1  Tired, decreased energy - 1 1  Change in appetite - 0 0  Feeling bad or failure about yourself  - 0 0  Trouble concentrating - 0 0  Moving slowly or fidgety/restless - 0 0  Suicidal thoughts - 0 0  PHQ-9 Score - 2 2     Review of Systems  All other systems reviewed and are negative.  Objective:   Physical Exam  Constitutional: She is oriented to person, place, and time. She appears well-developed and well-nourished.  HENT:  Head: Normocephalic and atraumatic.  Eyes: Conjunctivae and EOM are normal. Pupils are equal, round, and reactive to light.  Musculoskeletal:       Lumbar back: She exhibits normal range of motion, no tenderness and no deformity.  Negative straight leg raising  Neurological: She is alert and oriented to person, place, and time.  Psychiatric: She has a normal mood and affect.  Nursing note and vitals reviewed.   No pain to palpation lumbar paraspinal muscles She is decreased sensation to pinprick in the toes of both feet. She has intact light touch. Skin no evidence of lesions on the feet or toes. Motor strength is 5/5 bilateral deltoid, biceps, triceps, grip, hip flexor, knee  extensor, ankle dorsiflexor and plantar flexor      Assessment & Plan:   1. Lumbar postlaminectomy syndrome with history of L5-S1 fusion in 2011. No progressive pain, if this occurs she may need additional imaging study to look for adjacent level degeneration. Also at risk for sacroiliac pain as time wears on.  Continue hydrocodone 10/325 one by mouth 4 times a day Continue opioid monitoring program. This consists of regular clinic visits, examinations, urine drug screen, pill counts as well as use of [place] controlled substance reporting System. Last urine toxicology September 2016 which was appropriate and consistent  2. 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 She is getting good relief with nortriptyline but she would like to minimize her oral pain medications. Have instructed her to give a trial of Capsaicin creme to toes QID.  It may burn for the first week but then should diminish the pain to the point where she may be able to come off of her nortriptyline.

## 2015-06-11 NOTE — Patient Instructions (Signed)
Capsaicin Name, apply to feet 4 times per day, the first week will cause some burning and then it should diminish over time and her pain should improve. Hopefully we can get you off of the nortriptyline If this is helpful.

## 2015-06-24 ENCOUNTER — Telehealth: Payer: Self-pay

## 2015-06-24 ENCOUNTER — Other Ambulatory Visit: Payer: Self-pay | Admitting: Registered Nurse

## 2015-06-24 NOTE — Telephone Encounter (Signed)
It's okay to fill with 2 refills

## 2015-06-24 NOTE — Telephone Encounter (Signed)
Refill request for ambien.  No note to when or how patient should be taking this.  Please advise on refills?

## 2015-06-24 NOTE — Telephone Encounter (Signed)
[facility] per Danella Sensing ok.

## 2015-07-16 ENCOUNTER — Encounter: Payer: 59 | Attending: Physical Medicine and Rehabilitation | Admitting: Registered Nurse

## 2015-07-16 ENCOUNTER — Encounter: Payer: Self-pay | Admitting: Registered Nurse

## 2015-07-16 VITALS — BP 132/77 | HR 83

## 2015-07-16 DIAGNOSIS — F17210 Nicotine dependence, cigarettes, uncomplicated: Secondary | ICD-10-CM | POA: Insufficient documentation

## 2015-07-16 DIAGNOSIS — M79605 Pain in left leg: Secondary | ICD-10-CM | POA: Insufficient documentation

## 2015-07-16 DIAGNOSIS — T451X5S Adverse effect of antineoplastic and immunosuppressive drugs, sequela: Secondary | ICD-10-CM | POA: Diagnosis not present

## 2015-07-16 DIAGNOSIS — Z9221 Personal history of antineoplastic chemotherapy: Secondary | ICD-10-CM | POA: Insufficient documentation

## 2015-07-16 DIAGNOSIS — G894 Chronic pain syndrome: Secondary | ICD-10-CM

## 2015-07-16 DIAGNOSIS — M961 Postlaminectomy syndrome, not elsewhere classified: Secondary | ICD-10-CM | POA: Diagnosis not present

## 2015-07-16 DIAGNOSIS — M79604 Pain in right leg: Secondary | ICD-10-CM | POA: Insufficient documentation

## 2015-07-16 DIAGNOSIS — G8929 Other chronic pain: Secondary | ICD-10-CM | POA: Insufficient documentation

## 2015-07-16 DIAGNOSIS — G620 Drug-induced polyneuropathy: Secondary | ICD-10-CM | POA: Insufficient documentation

## 2015-07-16 DIAGNOSIS — Z5181 Encounter for therapeutic drug level monitoring: Secondary | ICD-10-CM

## 2015-07-16 DIAGNOSIS — Z760 Encounter for issue of repeat prescription: Secondary | ICD-10-CM | POA: Diagnosis not present

## 2015-07-16 DIAGNOSIS — T451X5A Adverse effect of antineoplastic and immunosuppressive drugs, initial encounter: Secondary | ICD-10-CM

## 2015-07-16 DIAGNOSIS — M545 Low back pain: Secondary | ICD-10-CM | POA: Insufficient documentation

## 2015-07-16 DIAGNOSIS — Z79899 Other long term (current) drug therapy: Secondary | ICD-10-CM

## 2015-07-16 MED ORDER — HYDROCODONE-ACETAMINOPHEN 10-325 MG PO TABS: ORAL_TABLET | ORAL | Status: DC

## 2015-07-16 NOTE — Progress Notes (Signed)
Subjective:    Patient ID: Elizabeth Torres, female    DOB: 09-Apr-1976, 39 y.o.   MRN: [id number]  HPI: Ms. HONORE PENSE is a 39 year old female who returns for follow up for chronic pain and medication refill. She says her pain is located in her lower back radiating into her lower extremities posteriorly. She rates her pain 3. Her current exercise regime is walking. She's working 40 hours a week.   Pain Inventory Average Pain 4 Pain Right Now 3 My pain is constant, dull and aching  In the last 24 hours, has pain interfered with the following? General activity 3 Relation with others 3 Enjoyment of life 2 What TIME of day is your pain at its worst? evening Sleep (in general) Fair  Pain is worse with: sitting, inactivity and standing Pain improves with: rest and medication Relief from Meds: 5  Mobility walk without assistance Do you have any goals in this area?  no  Function Do you have any goals in this area?  no  Neuro/Psych No problems in this area  Prior Studies Any changes since last visit?  no  Physicians involved in your care Any changes since last visit?  no   Family History  Problem Relation Age of Onset  . Diabetes Mother   . Hypertension Mother   . Cancer Paternal Uncle    Social History   Social History  . Marital Status: Married    Spouse Name: N/A  . Number of Children: N/A  . Years of Education: N/A   Social History Main Topics  . Smoking status: Current Every Day Smoker -- 1.00 packs/day    Types: Cigarettes  . Smokeless tobacco: Never Used  . Alcohol Use: No  . Drug Use: None  . Sexual Activity: Not Asked   Other Topics Concern  . None   Social History Narrative   Past Surgical History  Procedure Laterality Date  . Spine surgery  2008    Dr Joya Salm  . Mastectomy Right     partial with lymph node dissection  . Breast surgery     Past Medical History  Diagnosis Date  . Cancer (HCC)    BP 132/77 mmHg  Pulse 83  SpO2 98%   LMP 06/24/2015  Opioid Risk Score:   Fall Risk Score:  `1  Depression screen PHQ 2/9  Depression screen [facility] 2/9 04/17/2015 02/19/2015 11/14/2014  Decreased Interest 0 0 0  Down, Depressed, Hopeless 0 0 0  PHQ - 2 Score 0 0 0  Altered sleeping - 1 1  Tired, decreased energy - 1 1  Change in appetite - 0 0  Feeling bad or failure about yourself  - 0 0  Trouble concentrating - 0 0  Moving slowly or fidgety/restless - 0 0  Suicidal thoughts - 0 0  PHQ-9 Score - 2 2     Review of Systems  All other systems reviewed and are negative.      Objective:   Physical Exam  Constitutional: She is oriented to person, place, and time. She appears well-developed and well-nourished.  HENT:  Head: Normocephalic and atraumatic.  Neck: Normal range of motion. Neck supple.  Cardiovascular: Normal rate and regular rhythm.   Pulmonary/Chest: Effort normal and breath sounds normal.  Musculoskeletal:  Normal Muscle Bulk and Muscle Testing Reveals: Upper Extremities: Full ROM and Muscle Strength 5/5 Back without spinal or paraspinal tenderness Lower Extremities: Full ROM and Muscle Strength 5/5 Arises from chair with  ease Narrow Based Gait   Neurological: She is alert and oriented to person, place, and time.  Skin: Skin is warm and dry.  Psychiatric: She has a normal mood and affect.  Nursing note and vitals reviewed.         Assessment & Plan:  1. Lumbar postlaminectomy syndrome status post L5-S1 fusion with chronic S1 radiculopathy  Refilled: Hydrocodone 10/325 mg #120--one every 6 hours as needed for pain. 2. 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   15 minutes of face to face time was spent during this visit. All questions were encouraged and answered.  F/U in 5month

## 2015-08-13 ENCOUNTER — Encounter: Payer: 59 | Admitting: Registered Nurse

## 2015-08-15 ENCOUNTER — Encounter: Payer: 59 | Attending: Physical Medicine and Rehabilitation | Admitting: Registered Nurse

## 2015-08-15 ENCOUNTER — Encounter: Payer: Self-pay | Admitting: Registered Nurse

## 2015-08-15 VITALS — BP 117/82 | HR 75

## 2015-08-15 DIAGNOSIS — T451X5A Adverse effect of antineoplastic and immunosuppressive drugs, initial encounter: Secondary | ICD-10-CM

## 2015-08-15 DIAGNOSIS — F17210 Nicotine dependence, cigarettes, uncomplicated: Secondary | ICD-10-CM | POA: Diagnosis not present

## 2015-08-15 DIAGNOSIS — T451X5S Adverse effect of antineoplastic and immunosuppressive drugs, sequela: Secondary | ICD-10-CM | POA: Insufficient documentation

## 2015-08-15 DIAGNOSIS — Z5181 Encounter for therapeutic drug level monitoring: Secondary | ICD-10-CM | POA: Diagnosis not present

## 2015-08-15 DIAGNOSIS — M545 Low back pain: Secondary | ICD-10-CM | POA: Diagnosis not present

## 2015-08-15 DIAGNOSIS — M79605 Pain in left leg: Secondary | ICD-10-CM | POA: Insufficient documentation

## 2015-08-15 DIAGNOSIS — Z760 Encounter for issue of repeat prescription: Secondary | ICD-10-CM | POA: Insufficient documentation

## 2015-08-15 DIAGNOSIS — G620 Drug-induced polyneuropathy: Secondary | ICD-10-CM | POA: Diagnosis not present

## 2015-08-15 DIAGNOSIS — M79604 Pain in right leg: Secondary | ICD-10-CM | POA: Insufficient documentation

## 2015-08-15 DIAGNOSIS — G894 Chronic pain syndrome: Secondary | ICD-10-CM

## 2015-08-15 DIAGNOSIS — Z9221 Personal history of antineoplastic chemotherapy: Secondary | ICD-10-CM | POA: Diagnosis not present

## 2015-08-15 DIAGNOSIS — M961 Postlaminectomy syndrome, not elsewhere classified: Secondary | ICD-10-CM | POA: Diagnosis not present

## 2015-08-15 DIAGNOSIS — G8929 Other chronic pain: Secondary | ICD-10-CM | POA: Insufficient documentation

## 2015-08-15 DIAGNOSIS — Z79899 Other long term (current) drug therapy: Secondary | ICD-10-CM | POA: Diagnosis not present

## 2015-08-15 MED ORDER — HYDROCODONE-ACETAMINOPHEN 10-325 MG PO TABS: ORAL_TABLET | ORAL | Status: DC

## 2015-08-15 NOTE — Progress Notes (Signed)
Subjective:    Patient ID: Elizabeth Torres, female    DOB: November 21, 1975, 40 y.o.   MRN: [id number]  HPI: Elizabeth Torres is a 40 year old female who returns for follow up for chronic pain and medication refill. She says her pain is located in her lower back radiating into her lower extremities posteriorly. She rates her pain 3. Her current exercise regime is walking and performing stretching exercises. She's working 40 hours a week.  Pain Inventory Average Pain 4 Pain Right Now 3 My pain is constant, dull and aching  In the last 24 hours, has pain interfered with the following? General activity 3 Relation with others 3 Enjoyment of life 2 What TIME of day is your pain at its worst? evening Sleep (in general) Fair  Pain is worse with: sitting, inactivity and standing Pain improves with: rest and medication Relief from Meds: 5  Mobility walk without assistance  Function employed # of hrs/week .  Neuro/Psych No problems in this area  Prior Studies Any changes since last visit?  no  Physicians involved in your care Any changes since last visit?  no   Family History  Problem Relation Age of Onset  . Diabetes Mother   . Hypertension Mother   . Cancer Paternal Uncle    Social History   Social History  . Marital Status: Married    Spouse Name: N/A  . Number of Children: N/A  . Years of Education: N/A   Social History Main Topics  . Smoking status: Current Every Day Smoker -- 1.00 packs/day    Types: Cigarettes  . Smokeless tobacco: Never Used  . Alcohol Use: No  . Drug Use: None  . Sexual Activity: Not Asked   Other Topics Concern  . None   Social History Narrative   Past Surgical History  Procedure Laterality Date  . Spine surgery  2008    Dr Joya Salm  . Mastectomy Right     partial with lymph node dissection  . Breast surgery     Past Medical History  Diagnosis Date  . Cancer (HCC)    BP 117/82 mmHg  Pulse 75  SpO2 97%  LMP 06/24/2015  Opioid  Risk Score:   Fall Risk Score:  `1  Depression screen PHQ 2/9  Depression screen [facility] 2/9 08/15/2015 04/17/2015 02/19/2015 11/14/2014  Decreased Interest 0 0 0 0  Down, Depressed, Hopeless 0 0 0 0  PHQ - 2 Score 0 0 0 0  Altered sleeping - - 1 1  Tired, decreased energy - - 1 1  Change in appetite - - 0 0  Feeling bad or failure about yourself  - - 0 0  Trouble concentrating - - 0 0  Moving slowly or fidgety/restless - - 0 0  Suicidal thoughts - - 0 0  PHQ-9 Score - - 2 2     Review of Systems  All other systems reviewed and are negative.      Objective:   Physical Exam  Constitutional: She is oriented to person, place, and time. She appears well-developed and well-nourished.  HENT:  Head: Normocephalic and atraumatic.  Neck: Normal range of motion. Neck supple.  Cardiovascular: Normal rate and regular rhythm.   Pulmonary/Chest: Effort normal and breath sounds normal.  Musculoskeletal:  Normal Muscle Bulk and Muscle testing Reveals: Upper Extremities: Full ROM and Muscle Strength 5/5 Back without spinal or paraspinal tenderness Lower Extremities: Full ROM and Muscle Strength 5/5 Arises from chair with  ease Narrow Based Gait  Neurological: She is alert and oriented to person, place, and time.  Skin: Skin is warm and dry.  Psychiatric: She has a normal mood and affect.  Nursing note and vitals reviewed.         Assessment & Plan:  1. Lumbar postlaminectomy syndrome status post L5-S1 fusion with chronic S1 radiculopathy  Refilled: Hydrocodone 10/325 mg #120--one every 6 hours as needed for pain. 2. 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   15 minutes of face to face time was spent during this visit. All questions were encouraged and answered.  F/U in 36month

## 2015-08-19 ENCOUNTER — Other Ambulatory Visit: Payer: Self-pay | Admitting: Registered Nurse

## 2015-08-19 NOTE — Telephone Encounter (Signed)
Please advise on refill? I do not see documentation in note to continue this medication. Thanks!

## 2015-08-20 NOTE — Telephone Encounter (Signed)
Patient is requesting a refill on Ambien 5mg  [facility], phone number [phone].

## 2015-08-21 NOTE — Telephone Encounter (Signed)
Please advise on refill? I do not see documentation to continue this medication. Thanks!

## 2015-08-21 NOTE — Telephone Encounter (Signed)
Ambien prescription called in.

## 2015-08-24 LAB — TOXASSURE SELECT,+ANTIDEPR,UR: PDF: 0

## 2015-08-30 NOTE — Progress Notes (Signed)
Urine drug screen for this encounter is consistent for prescribed medication.  Urine creatinine is low, but has been low on previous tests as well, and her medication is present.

## 2015-09-02 ENCOUNTER — Other Ambulatory Visit: Payer: Self-pay | Admitting: Physical Medicine & Rehabilitation

## 2015-09-12 ENCOUNTER — Encounter: Payer: 59 | Attending: Physical Medicine and Rehabilitation | Admitting: Registered Nurse

## 2015-09-12 ENCOUNTER — Encounter: Payer: Self-pay | Admitting: Registered Nurse

## 2015-09-12 VITALS — BP 133/93 | HR 76 | Resp 14

## 2015-09-12 DIAGNOSIS — G620 Drug-induced polyneuropathy: Secondary | ICD-10-CM

## 2015-09-12 DIAGNOSIS — Z9221 Personal history of antineoplastic chemotherapy: Secondary | ICD-10-CM | POA: Insufficient documentation

## 2015-09-12 DIAGNOSIS — M79604 Pain in right leg: Secondary | ICD-10-CM | POA: Insufficient documentation

## 2015-09-12 DIAGNOSIS — G894 Chronic pain syndrome: Secondary | ICD-10-CM | POA: Diagnosis not present

## 2015-09-12 DIAGNOSIS — Z5181 Encounter for therapeutic drug level monitoring: Secondary | ICD-10-CM

## 2015-09-12 DIAGNOSIS — T451X5S Adverse effect of antineoplastic and immunosuppressive drugs, sequela: Secondary | ICD-10-CM | POA: Diagnosis not present

## 2015-09-12 DIAGNOSIS — M545 Low back pain: Secondary | ICD-10-CM | POA: Diagnosis not present

## 2015-09-12 DIAGNOSIS — Z79899 Other long term (current) drug therapy: Secondary | ICD-10-CM

## 2015-09-12 DIAGNOSIS — F17210 Nicotine dependence, cigarettes, uncomplicated: Secondary | ICD-10-CM | POA: Diagnosis not present

## 2015-09-12 DIAGNOSIS — G8929 Other chronic pain: Secondary | ICD-10-CM | POA: Insufficient documentation

## 2015-09-12 DIAGNOSIS — M961 Postlaminectomy syndrome, not elsewhere classified: Secondary | ICD-10-CM | POA: Diagnosis not present

## 2015-09-12 DIAGNOSIS — Z760 Encounter for issue of repeat prescription: Secondary | ICD-10-CM | POA: Insufficient documentation

## 2015-09-12 DIAGNOSIS — T451X5A Adverse effect of antineoplastic and immunosuppressive drugs, initial encounter: Secondary | ICD-10-CM

## 2015-09-12 DIAGNOSIS — M79605 Pain in left leg: Secondary | ICD-10-CM | POA: Diagnosis not present

## 2015-09-12 MED ORDER — HYDROCODONE-ACETAMINOPHEN 10-325 MG PO TABS: ORAL_TABLET | ORAL | Status: DC

## 2015-09-12 NOTE — Progress Notes (Signed)
Subjective:    Patient ID: Elizabeth Torres, female    DOB: May 14, 1976, 40 y.o.   MRN: [id number]  HPI: Elizabeth Torres is a 40 year old female who returns for follow up for chronic pain and medication refill. She says her pain is located in her lower back radiating into her lower extremities posteriorly. She rates her pain 3. Her current exercise regime is walking and performing stretching exercises. She's working 40 hours a week.  Pain Inventory Average Pain 4 Pain Right Now 3 My pain is constant, dull and aching  In the last 24 hours, has pain interfered with the following? General activity 3 Relation with others 3 Enjoyment of life 2 What TIME of day is your pain at its worst? evening Sleep (in general) Fair  Pain is worse with: sitting, inactivity and standing Pain improves with: rest and medication Relief from Meds: 5  Mobility Do you have any goals in this area?  no  Function Do you have any goals in this area?  no  Neuro/Psych No problems in this area  Prior Studies Any changes since last visit?  no  Physicians involved in your care Any changes since last visit?  no   Family History  Problem Relation Age of Onset  . Diabetes Mother   . Hypertension Mother   . Cancer Paternal Uncle    Social History   Social History  . Marital Status: Married    Spouse Name: N/A  . Number of Children: N/A  . Years of Education: N/A   Social History Main Topics  . Smoking status: Current Every Day Smoker -- 1.00 packs/day    Types: Cigarettes  . Smokeless tobacco: Never Used  . Alcohol Use: No  . Drug Use: None  . Sexual Activity: Not Asked   Other Topics Concern  . None   Social History Narrative   Past Surgical History  Procedure Laterality Date  . Spine surgery  2008    Dr Joya Salm  . Mastectomy Right     partial with lymph node dissection  . Breast surgery     Past Medical History  Diagnosis Date  . Cancer (HCC)    BP 133/93 mmHg  Pulse 76  Resp  14  SpO2 96%  Opioid Risk Score:   Fall Risk Score:  `1  Depression screen PHQ 2/9  Depression screen [facility] 2/9 08/15/2015 04/17/2015 02/19/2015 11/14/2014  Decreased Interest 0 0 0 0  Down, Depressed, Hopeless 0 0 0 0  PHQ - 2 Score 0 0 0 0  Altered sleeping - - 1 1  Tired, decreased energy - - 1 1  Change in appetite - - 0 0  Feeling bad or failure about yourself  - - 0 0  Trouble concentrating - - 0 0  Moving slowly or fidgety/restless - - 0 0  Suicidal thoughts - - 0 0  PHQ-9 Score - - 2 2     Review of Systems  All other systems reviewed and are negative.      Objective:   Physical Exam  Constitutional: She is oriented to person, place, and time. She appears well-developed and well-nourished.  HENT:  Head: Normocephalic and atraumatic.  Neck: Normal range of motion. Neck supple.  Cardiovascular: Normal rate and regular rhythm.   Pulmonary/Chest: Effort normal and breath sounds normal.  Musculoskeletal:  Normal Muscle Bulk and Muscle Testing Reveals: Upper Extremities: Full ROM and Muscle Strength 5/5 Lumbar Paraspinal Tenderness: L-3- L-5 Lower  Extremities: Full ROM and Muscle Strength 5/5 Arises from chair with ease Narrow Based Gait   Neurological: She is alert and oriented to person, place, and time.  Skin: Skin is warm and dry.  Psychiatric: She has a normal mood and affect.  Nursing note and vitals reviewed.         Assessment & Plan:  1. Lumbar postlaminectomy syndrome status post L5-S1 fusion with chronic S1 radiculopathy  Refilled: Hydrocodone 10/325 mg #120--one every 6 hours as needed for pain. 2. 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   15 minutes of face to face time was spent during this visit. All questions were encouraged and answered.  F/U in 77month

## 2015-10-09 ENCOUNTER — Telehealth: Payer: Self-pay | Admitting: *Deleted

## 2015-10-09 NOTE — Telephone Encounter (Signed)
Received t/c from Elizabeth Torres reporting she was long over due for an appointment with Dr. Grayland Ormond and her mammogram. Last appt with Dr. Grayland Ormond was 06/01/14 and last mammogram was on 06/01/14. Patient reports "things feel a little weird" regarding her right breast, and adds "it may just be scar tissue." Inst that I will talk to Dr. Grayland Ormond and see if he wants to see her first - or get a mammogram first, then call her back with appointments. [name] states she needs early morning appts and has to be at work ny noon. [name], BSN, MHA, OCN 10/09/2015 1:41 PM  T/C back to patient to inform of appt with Dr. Grayland Ormond on Friday - 10/11/15 at 9:30am at the [facility]. Patient agrees to this. Also informed that Dr. Grayland Ormond wants to see and examine her before scheduling a mammogram. [name], BSN, MHA, OCN 10/09/2015 2:26 PM

## 2015-10-10 ENCOUNTER — Encounter: Payer: 59 | Attending: Physical Medicine and Rehabilitation | Admitting: Registered Nurse

## 2015-10-10 ENCOUNTER — Encounter: Payer: Self-pay | Admitting: Registered Nurse

## 2015-10-10 VITALS — BP 126/96 | HR 84 | Resp 14

## 2015-10-10 DIAGNOSIS — F17210 Nicotine dependence, cigarettes, uncomplicated: Secondary | ICD-10-CM | POA: Diagnosis not present

## 2015-10-10 DIAGNOSIS — M79605 Pain in left leg: Secondary | ICD-10-CM | POA: Diagnosis not present

## 2015-10-10 DIAGNOSIS — G620 Drug-induced polyneuropathy: Secondary | ICD-10-CM | POA: Insufficient documentation

## 2015-10-10 DIAGNOSIS — Z9221 Personal history of antineoplastic chemotherapy: Secondary | ICD-10-CM | POA: Diagnosis not present

## 2015-10-10 DIAGNOSIS — M545 Low back pain: Secondary | ICD-10-CM | POA: Diagnosis not present

## 2015-10-10 DIAGNOSIS — Z79899 Other long term (current) drug therapy: Secondary | ICD-10-CM

## 2015-10-10 DIAGNOSIS — G894 Chronic pain syndrome: Secondary | ICD-10-CM | POA: Diagnosis not present

## 2015-10-10 DIAGNOSIS — Z5181 Encounter for therapeutic drug level monitoring: Secondary | ICD-10-CM | POA: Diagnosis not present

## 2015-10-10 DIAGNOSIS — M79604 Pain in right leg: Secondary | ICD-10-CM | POA: Insufficient documentation

## 2015-10-10 DIAGNOSIS — Z760 Encounter for issue of repeat prescription: Secondary | ICD-10-CM | POA: Insufficient documentation

## 2015-10-10 DIAGNOSIS — M961 Postlaminectomy syndrome, not elsewhere classified: Secondary | ICD-10-CM | POA: Diagnosis not present

## 2015-10-10 DIAGNOSIS — G8929 Other chronic pain: Secondary | ICD-10-CM | POA: Diagnosis not present

## 2015-10-10 DIAGNOSIS — T451X5S Adverse effect of antineoplastic and immunosuppressive drugs, sequela: Secondary | ICD-10-CM | POA: Insufficient documentation

## 2015-10-10 DIAGNOSIS — T451X5A Adverse effect of antineoplastic and immunosuppressive drugs, initial encounter: Secondary | ICD-10-CM

## 2015-10-10 MED ORDER — HYDROCODONE-ACETAMINOPHEN 10-325 MG PO TABS: ORAL_TABLET | ORAL | Status: DC

## 2015-10-10 NOTE — Progress Notes (Signed)
Subjective:    Patient ID: Elizabeth Torres, female    DOB: [birth year], 40 y.o.   MRN: [id number]  HPI: Elizabeth Torres is a 40 year old female who returns for follow up for chronic pain and medication refill. She states her pain is located in her lower back and lower extremities. She rates her pain 5. Her current exercise regime is walking and performing stretching exercises. She's working 40 hours a week.  Pain Inventory Average Pain 4 Pain Right Now 4 My pain is constant, dull and aching  In the last 24 hours, has pain interfered with the following? General activity 3 Relation with others 3 Enjoyment of life 3 What TIME of day is your pain at its worst? evening Sleep (in general) Fair  Pain is worse with: sitting and standing Pain improves with: rest and medication Relief from Meds: 7  Mobility Do you have any goals in this area?  no  Function Do you have any goals in this area?  no  Neuro/Psych No problems in this area  Prior Studies Any changes since last visit?  no  Physicians involved in your care Any changes since last visit?  no   Family History  Problem Relation Age of Onset  . Diabetes Mother   . Hypertension Mother   . Cancer Paternal Uncle    Social History   Social History  . Marital Status: Married    Spouse Name: N/A  . Number of Children: N/A  . Years of Education: N/A   Social History Main Topics  . Smoking status: Current Every Day Smoker -- 1.00 packs/day    Types: Cigarettes  . Smokeless tobacco: Never Used  . Alcohol Use: No  . Drug Use: None  . Sexual Activity: Not Asked   Other Topics Concern  . None   Social History Narrative   Past Surgical History  Procedure Laterality Date  . Spine surgery  2008    Dr Joya Salm  . Mastectomy Right     partial with lymph node dissection  . Breast surgery     Past Medical History  Diagnosis Date  . Cancer (HCC)    BP 126/96 mmHg  Pulse 84  Resp 14  SpO2 99%  Opioid Risk Score:    Fall Risk Score:  `1  Depression screen PHQ 2/9  Depression screen [facility] 2/9 10/10/2015 08/15/2015 04/17/2015 02/19/2015 11/14/2014  Decreased Interest 0 0 0 0 0  Down, Depressed, Hopeless 0 0 0 0 0  PHQ - 2 Score 0 0 0 0 0  Altered sleeping 2 - - 1 1  Tired, decreased energy 1 - - 1 1  Change in appetite 0 - - 0 0  Feeling bad or failure about yourself  0 - - 0 0  Trouble concentrating 0 - - 0 0  Moving slowly or fidgety/restless 0 - - 0 0  Suicidal thoughts - - - 0 0  PHQ-9 Score 3 - - 2 2  Difficult doing work/chores Not difficult at all - - - -     Review of Systems  All other systems reviewed and are negative.      Objective:   Physical Exam  Constitutional: She is oriented to person, place, and time. She appears well-developed and well-nourished.  HENT:  Head: Normocephalic and atraumatic.  Neck: Normal range of motion. Neck supple.  Cardiovascular: Normal rate and regular rhythm.   Pulmonary/Chest: Effort normal and breath sounds normal.  Musculoskeletal:  Normal  Muscle Bulk and Muscle Testing Reveals: Upper Extremities: Full ROM and Muscle Strength 5/5 Back without spinal or paraspinal tenderness Lower Extremities: Full ROM and Muscle Strength 5/5 Arises from chair with ease Narrow Based Gait  Neurological: She is alert and oriented to person, place, and time.  Skin: Skin is warm and dry.  Psychiatric: She has a normal mood and affect.  Nursing note and vitals reviewed.         Assessment & Plan:  1. Lumbar postlaminectomy syndrome status post L5-S1 fusion with chronic S1 radiculopathy  Refilled: Hydrocodone 10/325 mg #120--one every 6 hours as needed for pain. 2. 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   15 minutes of face to face time was spent during this visit. All questions were encouraged and answered.  F/U in 22month

## 2015-10-11 ENCOUNTER — Inpatient Hospital Stay: Payer: 59 | Attending: Oncology | Admitting: Oncology

## 2015-10-11 ENCOUNTER — Inpatient Hospital Stay: Payer: 59

## 2015-10-11 VITALS — BP 136/87 | HR 80 | Temp 97.2°F | Resp 18 | Wt 160.3 lb

## 2015-10-11 DIAGNOSIS — Z171 Estrogen receptor negative status [ER-]: Secondary | ICD-10-CM | POA: Diagnosis not present

## 2015-10-11 DIAGNOSIS — Z9221 Personal history of antineoplastic chemotherapy: Secondary | ICD-10-CM

## 2015-10-11 DIAGNOSIS — Z79899 Other long term (current) drug therapy: Secondary | ICD-10-CM | POA: Diagnosis not present

## 2015-10-11 DIAGNOSIS — F17210 Nicotine dependence, cigarettes, uncomplicated: Secondary | ICD-10-CM | POA: Diagnosis not present

## 2015-10-11 DIAGNOSIS — Z923 Personal history of irradiation: Secondary | ICD-10-CM | POA: Diagnosis not present

## 2015-10-11 DIAGNOSIS — Z9011 Acquired absence of right breast and nipple: Secondary | ICD-10-CM | POA: Insufficient documentation

## 2015-10-11 DIAGNOSIS — C50911 Malignant neoplasm of unspecified site of right female breast: Secondary | ICD-10-CM | POA: Diagnosis present

## 2015-10-11 DIAGNOSIS — C50511 Malignant neoplasm of lower-outer quadrant of right female breast: Secondary | ICD-10-CM

## 2015-10-11 NOTE — Progress Notes (Signed)
Patient not seen in office since 2015 and returns with concerns of rash on right breast also occasional right rib pain.

## 2015-10-11 NOTE — Progress Notes (Signed)
Elizabeth Torres  [phone] [phone]  ID: [name] OB: [birth year]  MR#: [id number]  GUR#:[id number]  Patient Care Team: Juline Patch, MD as PCP - General (Family Medicine)  CHIEF COMPLAINT:  Chief Complaint  Patient presents with  . Breast Cancer     INTERVAL HISTORY: Patient last evaluated in clinic in December 2014.  She returns to clinic today for routine followup and further evaluation.  She has some mild tenderness at her surgical site on her right breast, but otherwise feels well.  She currently feels well and is asymptomatic.  She has no neurologic complaints.  She denies any recent fevers. She has a good appetite and denies weight loss. She denies any chest pain or shortness of breath.  She has no nausea, vomiting, constipation, or diarrhea.  She has no urinary complaints.  Patient offers no further specific complaints today.  REVIEW OF SYSTEMS:   Review of Systems  Constitutional: Negative.  Negative for fever, weight loss and malaise/fatigue.  Respiratory: Negative.  Negative for shortness of breath.   Cardiovascular: Negative.  Negative for chest pain.  Gastrointestinal: Negative.  Negative for abdominal pain.  Musculoskeletal: Negative.   Neurological: Negative.  Negative for weakness.    As per HPI. Otherwise, a complete review of systems is negatve.  PAST MEDICAL HISTORY: Past Medical History  Diagnosis Date  . Cancer [facility])     PAST SURGICAL HISTORY: Past Surgical History  Procedure Laterality Date  . Spine surgery  2008    Dr Joya Salm  . Mastectomy Right     partial with lymph node dissection  . Breast surgery      FAMILY HISTORY Family History  Problem Relation Age of Onset  . Diabetes Mother   . Hypertension Mother   . Cancer Paternal Uncle        ADVANCED DIRECTIVES:    HEALTH MAINTENANCE: Social History  Substance Use Topics  . Smoking status: Current Every Day Smoker -- 1.00 packs/day    Types:  Cigarettes  . Smokeless tobacco: Never Used  . Alcohol Use: No     Colonoscopy:  PAP:  Bone density:  Lipid panel:  Allergies  Allergen Reactions  . Penicillins Anaphylaxis  . Sulfa Antibiotics Anaphylaxis  . Neurontin [Gabapentin] Other (See Comments)    lethargy  . Zofran [Ondansetron Hcl]     Current Outpatient Prescriptions  Medication Sig Dispense Refill  . HYDROcodone-acetaminophen (NORCO) 10-325 MG tablet TAKE 1 TABLET BY MOUTH EVERY 6 HOURS AS NEEDED FOR PAIN 120 tablet 0  . ibuprofen (ADVIL,MOTRIN) 800 MG tablet Take 800 mg by mouth every 6 (six) hours as needed (pain).     . nortriptyline (PAMELOR) 10 MG capsule TAKE 1 CAPSULE (10 MG TOTAL) BY MOUTH AT BEDTIME. 30 capsule 3  . zolpidem (AMBIEN) 5 MG tablet TAKE 1 TABLET BY MOUTH AT BEDTIME AS NEEDED 30 tablet 2   No current facility-administered medications for this visit.    OBJECTIVE: Filed Vitals:   10/11/15 0945  BP: 136/87  Pulse: 80  Temp: 97.2 F (36.2 C)  Resp: 18     Body mass index is 28.4 kg/(m^2).    ECOG FS:0 - Asymptomatic  General: Well-developed, well-nourished, no acute distress. Eyes: Pink conjunctiva, anicteric sclera. HEENT: Normocephalic, moist mucous membranes, clear oropharnyx. Breasts: Bilateral breast and axilla without lumps or masses. Lungs: Clear to auscultation bilaterally. Heart: Regular rate and rhythm. No rubs, murmurs, or gallops. Abdomen: Soft, nontender, nondistended. No organomegaly noted, normoactive bowel  sounds. Musculoskeletal: No edema, cyanosis, or clubbing. Neuro: Alert, answering all questions appropriately. Cranial nerves grossly intact. Skin: No rashes or petechiae noted. Psych: Normal affect. Lymphatics: No cervical, calvicular, axillary or inguinal LAD.   LAB RESULTS:  Lab Results  Component Value Date   NA 136 02/19/2015   K 3.8 02/19/2015   CL 102 02/19/2015   CO2 28 02/19/2015   GLUCOSE 100* 02/19/2015   BUN 7 02/19/2015   CREATININE 0.72  02/19/2015   CALCIUM 9.4 02/19/2015   PROT 6.3* 02/19/2015   ALBUMIN 3.9 02/19/2015   AST 27 02/19/2015   ALT 23 02/19/2015   ALKPHOS 59 02/19/2015   BILITOT 0.7 02/19/2015   GFRNONAA >60 02/19/2015   GFRAA >60 02/19/2015    Lab Results  Component Value Date   WBC 8.3 02/19/2015   NEUTROABS 4.5 02/19/2015   HGB 14.2 02/19/2015   HCT 41.3 02/19/2015   MCV 93.0 02/19/2015   PLT 233 02/19/2015     STUDIES: No results found.  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   PLAN:    1.  Breast cancer: BRCA negative.  Patient completed chemotherapy with AC-Taxol in July 2013. She completed adjuvant XRT in October 2013. Patient has not had a mammogram in greater than 2 years therefore will schedule one in the next 1-2 weeks. CA-27-29 is pending from today.  She does not require tamoxifen given the ER/PR status of her tumor. Return to clinic in 6 months for routine evaluation.  2.  Peripheral neuropathy: Resolved. 3.  Port: Patient has not had her port flushed since her last visit greater than 2 years ago. Will make referral to surgery for removal.  Patient expressed understanding and was in agreement with this plan. She also understands that She can call clinic at any time with any questions, concerns, or complaints.   Lloyd Huger, MD   10/11/2015 10:14 AM

## 2015-10-12 LAB — CANCER ANTIGEN 27.29: CA 27.29: 28.3 U/mL (ref 0.0–38.6)

## 2015-10-15 ENCOUNTER — Encounter: Payer: Self-pay | Admitting: *Deleted

## 2015-10-28 ENCOUNTER — Ambulatory Visit: Payer: Self-pay | Admitting: General Surgery

## 2015-10-31 ENCOUNTER — Other Ambulatory Visit: Payer: Self-pay | Admitting: Oncology

## 2015-10-31 ENCOUNTER — Ambulatory Visit
Admission: RE | Admit: 2015-10-31 | Discharge: 2015-10-31 | Disposition: A | Discharge status: Home / Self Care | Payer: 59 | Source: Ambulatory Visit | Attending: Oncology | Admitting: Oncology

## 2015-10-31 DIAGNOSIS — C50511 Malignant neoplasm of lower-outer quadrant of right female breast: Secondary | ICD-10-CM

## 2015-10-31 DIAGNOSIS — Z853 Personal history of malignant neoplasm of breast: Secondary | ICD-10-CM | POA: Diagnosis present

## 2015-10-31 HISTORY — DX: Malignant neoplasm of unspecified site of unspecified female breast: C50.919

## 2015-10-31 HISTORY — DX: Reserved for concepts with insufficient information to code with codable children: IMO0002

## 2015-10-31 HISTORY — DX: Personal history of antineoplastic chemotherapy: Z92.21

## 2015-11-07 ENCOUNTER — Encounter: Payer: 59 | Attending: Physical Medicine and Rehabilitation | Admitting: Registered Nurse

## 2015-11-07 ENCOUNTER — Encounter: Payer: Self-pay | Admitting: Registered Nurse

## 2015-11-07 ENCOUNTER — Ambulatory Visit (INDEPENDENT_AMBULATORY_CARE_PROVIDER_SITE_OTHER): Payer: 59 | Admitting: General Surgery

## 2015-11-07 ENCOUNTER — Encounter: Payer: Self-pay | Admitting: General Surgery

## 2015-11-07 ENCOUNTER — Ambulatory Visit: Payer: Self-pay | Admitting: General Surgery

## 2015-11-07 VITALS — BP 144/78 | HR 90 | Resp 12 | Ht 63.0 in | Wt 162.0 lb

## 2015-11-07 VITALS — BP 135/92 | HR 97 | Resp 14

## 2015-11-07 DIAGNOSIS — G8929 Other chronic pain: Secondary | ICD-10-CM | POA: Diagnosis not present

## 2015-11-07 DIAGNOSIS — M961 Postlaminectomy syndrome, not elsewhere classified: Secondary | ICD-10-CM | POA: Insufficient documentation

## 2015-11-07 DIAGNOSIS — G894 Chronic pain syndrome: Secondary | ICD-10-CM

## 2015-11-07 DIAGNOSIS — T451X5A Adverse effect of antineoplastic and immunosuppressive drugs, initial encounter: Secondary | ICD-10-CM

## 2015-11-07 DIAGNOSIS — G620 Drug-induced polyneuropathy: Secondary | ICD-10-CM | POA: Diagnosis not present

## 2015-11-07 DIAGNOSIS — Z5181 Encounter for therapeutic drug level monitoring: Secondary | ICD-10-CM

## 2015-11-07 DIAGNOSIS — Z79899 Other long term (current) drug therapy: Secondary | ICD-10-CM

## 2015-11-07 DIAGNOSIS — M79604 Pain in right leg: Secondary | ICD-10-CM | POA: Diagnosis not present

## 2015-11-07 DIAGNOSIS — M79605 Pain in left leg: Secondary | ICD-10-CM | POA: Diagnosis not present

## 2015-11-07 DIAGNOSIS — Z853 Personal history of malignant neoplasm of breast: Secondary | ICD-10-CM | POA: Diagnosis not present

## 2015-11-07 DIAGNOSIS — M545 Low back pain: Secondary | ICD-10-CM | POA: Insufficient documentation

## 2015-11-07 DIAGNOSIS — F17210 Nicotine dependence, cigarettes, uncomplicated: Secondary | ICD-10-CM | POA: Insufficient documentation

## 2015-11-07 DIAGNOSIS — T451X5S Adverse effect of antineoplastic and immunosuppressive drugs, sequela: Secondary | ICD-10-CM | POA: Diagnosis not present

## 2015-11-07 DIAGNOSIS — Z9221 Personal history of antineoplastic chemotherapy: Secondary | ICD-10-CM | POA: Insufficient documentation

## 2015-11-07 DIAGNOSIS — Z760 Encounter for issue of repeat prescription: Secondary | ICD-10-CM | POA: Diagnosis not present

## 2015-11-07 HISTORY — PX: PORT-A-CATH REMOVAL: SHX5289

## 2015-11-07 MED ORDER — HYDROCODONE-ACETAMINOPHEN 10-325 MG PO TABS: ORAL_TABLET | ORAL | Status: DC

## 2015-11-07 NOTE — Patient Instructions (Signed)
Keep area clean Ice pack as needed for comfort May remove dressing in 2-3 days, steri strips will fall off in 1-2 weeks. The patient is aware to call back for any questions or concerns.

## 2015-11-07 NOTE — Progress Notes (Signed)
Subjective:    Patient ID: Elizabeth Torres, female    DOB: July 31, 1976, 40 y.o.   MRN: [id number]  HPI: Elizabeth Torres is a 40 year old female who returns for follow up for chronic pain and medication refill. She states her pain is located in her lower back and lower extremities. She rates her pain 3. Her current exercise regime is walking and performing stretching exercises. She's working 40 hours a week.  Pain Inventory Average Pain 4 Pain Right Now 3 My pain is constant, dull and aching  In the last 24 hours, has pain interfered with the following? General activity 3 Relation with others 3 Enjoyment of life 2 What TIME of day is your pain at its worst? evening Sleep (in general) Fair  Pain is worse with: sitting, inactivity and standing Pain improves with: rest and medication Relief from Meds: 5  Mobility Do you have any goals in this area?  no  Function Do you have any goals in this area?  no  Neuro/Psych No problems in this area  Prior Studies Any changes since last visit?  no  Physicians involved in your care Any changes since last visit?  no   Family History  Problem Relation Age of Onset  . Diabetes Mother   . Hypertension Mother   . Cancer Paternal Uncle   . Breast cancer Paternal Uncle     40'S  . Breast cancer Paternal Aunt     88'S   Social History   Social History  . Marital Status: Married    Spouse Name: N/A  . Number of Children: N/A  . Years of Education: N/A   Social History Main Topics  . Smoking status: Current Every Day Smoker -- 1.00 packs/day    Types: Cigarettes  . Smokeless tobacco: Never Used  . Alcohol Use: No  . Drug Use: None  . Sexual Activity: Not Asked   Other Topics Concern  . None   Social History Narrative   Past Surgical History  Procedure Laterality Date  . Spine surgery  2008    Dr Joya Salm  . Mastectomy Right     partial with lymph node dissection  . Breast surgery     Past Medical History  Diagnosis  Date  . Cancer ([place])   . Breast cancer ([place]) 2013    RT LUMPECTOMY  . Radiation 2013    BREAST CA  . Status post chemotherapy 2013    BREAST CA   BP 135/92 mmHg  Pulse 97  Resp 14  SpO2 96%  LMP 10/09/2015  Opioid Risk Score:   Fall Risk Score:  `1  Depression screen PHQ 2/9  Depression screen [facility] 2/9 10/10/2015 08/15/2015 04/17/2015 02/19/2015 11/14/2014  Decreased Interest 0 0 0 0 0  Down, Depressed, Hopeless 0 0 0 0 0  PHQ - 2 Score 0 0 0 0 0  Altered sleeping 2 - - 1 1  Tired, decreased energy 1 - - 1 1  Change in appetite 0 - - 0 0  Feeling bad or failure about yourself  0 - - 0 0  Trouble concentrating 0 - - 0 0  Moving slowly or fidgety/restless 0 - - 0 0  Suicidal thoughts - - - 0 0  PHQ-9 Score 3 - - 2 2  Difficult doing work/chores Not difficult at all - - - -      Review of Systems  All other systems reviewed and are negative.  Objective:   Physical Exam  Constitutional: She is oriented to person, place, and time. She appears well-developed and well-nourished.  HENT:  Head: Normocephalic and atraumatic.  Neck: Normal range of motion. Neck supple.  Cardiovascular: Normal rate and regular rhythm.   Pulmonary/Chest: Effort normal and breath sounds normal.  Musculoskeletal:  Normal Muscle Bulk and Muscle Testing Reveals: Upper Extremities: Full ROM and Muscle Strength 5/5 Back without spinal tenderness Lower Extremities: Full ROM and Muscle Strength 5/5 Arises from chair with ease Narrow Based Gait  Neurological: She is alert and oriented to person, place, and time.  Skin: Skin is warm and dry.  Psychiatric: She has a normal mood and affect.  Nursing note and vitals reviewed.         Assessment & Plan:  1. Lumbar postlaminectomy syndrome status post L5-S1 fusion with chronic S1 radiculopathy  Refilled: Hydrocodone 10/325 mg #120--one every 6 hours as needed for pain. 2. 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   15 minutes of face to face time was spent during this visit. All questions were encouraged and answered.  F/U in 16month

## 2015-11-07 NOTE — Progress Notes (Signed)
Patient ID: Elizabeth Torres, female   DOB: [birth year], 40 y.o.   MRN: [id number]  Chief Complaint  Patient presents with  . Procedure    port removal    HPI Elizabeth Torres is a 40 y.o. female here today for a port removal. The initial port placement was completed by Phoebe Perch, M.D.   HPI  Past Medical History  Diagnosis Date  . Cancer ([place])   . Breast cancer ([place]) 2013    RT LUMPECTOMY  . Radiation 2013    BREAST CA  . Status post chemotherapy 2013    BREAST CA  . BRCA negative     Past Surgical History  Procedure Laterality Date  . Spine surgery  2008    Dr Joya Salm  . Mastectomy Right     partial with lymph node dissection  . Breast surgery    . Port a cath revision    . Port-a-cath removal  11-07-15    Dr Bary Castilla    Family History  Problem Relation Age of Onset  . Diabetes Mother   . Hypertension Mother   . Cancer Paternal Uncle   . Breast cancer Paternal Uncle     40'S  . Breast cancer Paternal Aunt     89'S    Social History Social History  Substance Use Topics  . Smoking status: Current Every Day Smoker -- 1.00 packs/day    Types: Cigarettes  . Smokeless tobacco: Never Used  . Alcohol Use: No    Allergies  Allergen Reactions  . Penicillins Anaphylaxis  . Sulfa Antibiotics Anaphylaxis  . Neurontin [Gabapentin] Other (See Comments)    lethargy  . Zofran [Ondansetron Hcl]     Current Outpatient Prescriptions  Medication Sig Dispense Refill  . HYDROcodone-acetaminophen (NORCO) 10-325 MG tablet TAKE 1 TABLET BY MOUTH EVERY 6 HOURS AS NEEDED FOR PAIN 120 tablet 0  . ibuprofen (ADVIL,MOTRIN) 800 MG tablet Take 800 mg by mouth every 6 (six) hours as needed (pain).     . nortriptyline (PAMELOR) 10 MG capsule TAKE 1 CAPSULE (10 MG TOTAL) BY MOUTH AT BEDTIME. 30 capsule 3  . zolpidem (AMBIEN) 5 MG tablet TAKE 1 TABLET BY MOUTH AT BEDTIME AS NEEDED 30 tablet 2   No current facility-administered medications for this visit.    Review of Systems Review  of Systems  Constitutional: Negative.   Respiratory: Negative.   Cardiovascular: Negative.     Blood pressure 144/78, pulse 90, resp. rate 12, height _0  (1.6 m), weight 162 lb (73.483 kg), last menstrual period 10/09/2015.  Physical Exam Physical Exam  Constitutional: She is oriented to person, place, and time. She appears well-developed and well-nourished.  Pulmonary/Chest:    Neurological: She is alert and oriented to person, place, and time.  Skin: Skin is warm and dry.    Data Reviewed Request for port removal received from attending oncologist.  Assessment    Candidate for port removal.    Plan    The procedure was reviewed with the patient and she was amenable to proceed. Alcohol is applied to the skin followed by 10 mL of 0.5% Xylocaine with 0.25% Marcaine with 1-200,000 units of epinephrine. ChloraPrep was applied to the skin. The original incision was opened. The 3 transfixing sutures were removed and the port mobilized. The catheter was easily extracted from the subclavian vein with no bleeding noted. The site was closed with a running 3-0 Vicryl suture to the adipose layer. The skin was closed with a running  3-0 Vicryl subcuticular suture. Benzoin, Steri-Strips, Telfa and Tegaderm dressing applied.  The patient tolerated the procedure well. Ice pack provided. Tylenol or nonsteroidal for comfort as needed. She is welcome to return for wound check with the nurse next week if desired. Follow up otherwise will be on an as-needed basis.     PCP:  Otilio Miu This information has been scribed by Gaspar Cola CMA.   Robert Bellow 11/07/2015, 1:55 PM

## 2015-11-13 ENCOUNTER — Other Ambulatory Visit: Payer: Self-pay | Admitting: Registered Nurse

## 2015-12-04 ENCOUNTER — Encounter: Payer: Self-pay | Admitting: Registered Nurse

## 2015-12-04 ENCOUNTER — Encounter: Payer: 59 | Attending: Physical Medicine and Rehabilitation | Admitting: Registered Nurse

## 2015-12-04 VITALS — BP 134/77 | HR 83 | Resp 14

## 2015-12-04 DIAGNOSIS — G8929 Other chronic pain: Secondary | ICD-10-CM | POA: Diagnosis not present

## 2015-12-04 DIAGNOSIS — M961 Postlaminectomy syndrome, not elsewhere classified: Secondary | ICD-10-CM | POA: Insufficient documentation

## 2015-12-04 DIAGNOSIS — F17210 Nicotine dependence, cigarettes, uncomplicated: Secondary | ICD-10-CM | POA: Insufficient documentation

## 2015-12-04 DIAGNOSIS — M79605 Pain in left leg: Secondary | ICD-10-CM | POA: Insufficient documentation

## 2015-12-04 DIAGNOSIS — Z9221 Personal history of antineoplastic chemotherapy: Secondary | ICD-10-CM | POA: Diagnosis not present

## 2015-12-04 DIAGNOSIS — M545 Low back pain: Secondary | ICD-10-CM | POA: Insufficient documentation

## 2015-12-04 DIAGNOSIS — Z5181 Encounter for therapeutic drug level monitoring: Secondary | ICD-10-CM | POA: Diagnosis not present

## 2015-12-04 DIAGNOSIS — G894 Chronic pain syndrome: Secondary | ICD-10-CM | POA: Diagnosis not present

## 2015-12-04 DIAGNOSIS — T451X5S Adverse effect of antineoplastic and immunosuppressive drugs, sequela: Secondary | ICD-10-CM | POA: Diagnosis not present

## 2015-12-04 DIAGNOSIS — Z760 Encounter for issue of repeat prescription: Secondary | ICD-10-CM | POA: Insufficient documentation

## 2015-12-04 DIAGNOSIS — Z79899 Other long term (current) drug therapy: Secondary | ICD-10-CM

## 2015-12-04 DIAGNOSIS — G620 Drug-induced polyneuropathy: Secondary | ICD-10-CM | POA: Diagnosis not present

## 2015-12-04 DIAGNOSIS — M79604 Pain in right leg: Secondary | ICD-10-CM | POA: Insufficient documentation

## 2015-12-04 DIAGNOSIS — T451X5A Adverse effect of antineoplastic and immunosuppressive drugs, initial encounter: Secondary | ICD-10-CM

## 2015-12-04 MED ORDER — HYDROCODONE-ACETAMINOPHEN 10-325 MG PO TABS: ORAL_TABLET | ORAL | Status: DC

## 2015-12-04 NOTE — Progress Notes (Signed)
Subjective:    Patient ID: Elizabeth Torres, female    DOB: 1975-08-25, 40 y.o.   MRN: [id number]  HPI: [name] is a 40 year old female who returns for follow up for chronic pain and medication refill. She states her pain is located in her lower back and lower extremities. She rates her pain 3. Her current exercise regime is walking and performing stretching exercises. She's working 40 hours a week.  Pain Inventory Average Pain 4 Pain Right Now 3 My pain is constant, dull and aching  In the last 24 hours, has pain interfered with the following? General activity 3 Relation with others 3 Enjoyment of life 2 What TIME of day is your pain at its worst? evening Sleep (in general) Fair  Pain is worse with: sitting, inactivity and standing Pain improves with: rest and medication Relief from Meds: 5  Mobility walk without assistance Do you have any goals in this area?  no  Function Do you have any goals in this area?  no  Neuro/Psych No problems in this area  Prior Studies Any changes since last visit?  no  Physicians involved in your care Any changes since last visit?  no   Family History  Problem Relation Age of Onset  . Diabetes Mother   . Hypertension Mother   . Cancer Paternal Uncle   . Breast cancer Paternal Uncle     40'S  . Breast cancer Paternal Aunt     68'S   Social History   Social History  . Marital Status: Married    Spouse Name: N/A  . Number of Children: N/A  . Years of Education: N/A   Social History Main Topics  . Smoking status: Current Every Day Smoker -- 1.00 packs/day    Types: Cigarettes  . Smokeless tobacco: Never Used  . Alcohol Use: No  . Drug Use: None  . Sexual Activity: Not Asked   Other Topics Concern  . None   Social History Narrative   Past Surgical History  Procedure Laterality Date  . Spine surgery  2008    Dr Joya Salm  . Mastectomy Right     partial with lymph node dissection  . Breast surgery    . Port a  cath revision    . Port-a-cath removal  11-07-15    Dr Bary Castilla   Past Medical History  Diagnosis Date  . Cancer ([place])   . Breast cancer ([place]) 2013    RT LUMPECTOMY  . Radiation 2013    BREAST CA  . Status post chemotherapy 2013    BREAST CA  . BRCA negative    BP 134/77 mmHg  Pulse 83  Resp 14  SpO2 97%  LMP 10/09/2015  Opioid Risk Score:   Fall Risk Score:  `1  Depression screen PHQ 2/9  Depression screen [facility] 2/9 10/10/2015 08/15/2015 04/17/2015 02/19/2015 11/14/2014  Decreased Interest 0 0 0 0 0  Down, Depressed, Hopeless 0 0 0 0 0  PHQ - 2 Score 0 0 0 0 0  Altered sleeping 2 - - 1 1  Tired, decreased energy 1 - - 1 1  Change in appetite 0 - - 0 0  Feeling bad or failure about yourself  0 - - 0 0  Trouble concentrating 0 - - 0 0  Moving slowly or fidgety/restless 0 - - 0 0  Suicidal thoughts - - - 0 0  PHQ-9 Score 3 - - 2 2  Difficult doing  work/chores Not difficult at all - - - -     Review of Systems  All other systems reviewed and are negative.      Objective:   Physical Exam  Constitutional: She is oriented to person, place, and time. She appears well-developed and well-nourished.  HENT:  Head: Normocephalic and atraumatic.  Neck: Normal range of motion. Neck supple.  Cardiovascular: Normal rate and regular rhythm.   Pulmonary/Chest: Effort normal and breath sounds normal.  Musculoskeletal:  Normal Muscle Bulk and Muscle Testing Reveals: Upper Extremities: Full ROM and Muscle Strength 5/5 Back without tenderness noted Lower Extremities: Full ROM and Muscle Strength 5/5 Arises from chair with ease Narrow Based Gait  Neurological: She is alert and oriented to person, place, and time.  Skin: Skin is warm and dry.  Psychiatric: She has a normal mood and affect.  Nursing note and vitals reviewed.         Assessment & Plan:  1. Lumbar postlaminectomy syndrome status post L5-S1 fusion with chronic S1 radiculopathy  Refilled: Hydrocodone 10/325 mg  #120--one every 6 hours as needed for pain. We will continue the opioid monitoring program, this consists of regular clinic visits, examinations, urine drug screen, pill counts as well as use of [place] Controlled Substance reporting System. 2. 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   15 minutes of face to face time was spent during this visit. All questions were encouraged and answered.  F/U in 72month

## 2015-12-04 NOTE — Addendum Note (Signed)
Addended by: Caro Hight on: 12/04/2015 02:49 PM   Modules accepted: Orders

## 2015-12-11 LAB — TOXASSURE SELECT,+ANTIDEPR,UR

## 2015-12-11 NOTE — Progress Notes (Signed)
Urine drug screen for this encounter is consistent for prescribed medication 

## 2015-12-17 ENCOUNTER — Telehealth: Payer: Self-pay | Admitting: Registered Nurse

## 2015-12-17 ENCOUNTER — Telehealth: Payer: Self-pay

## 2015-12-17 DIAGNOSIS — M542 Cervicalgia: Secondary | ICD-10-CM

## 2015-12-17 DIAGNOSIS — M25511 Pain in right shoulder: Secondary | ICD-10-CM

## 2015-12-17 NOTE — Telephone Encounter (Signed)
Pt is requesting to speak with ET. Did not specify details in message.

## 2015-12-17 NOTE — Telephone Encounter (Signed)
Xrays ordered.

## 2015-12-17 NOTE — Telephone Encounter (Signed)
Ms. Hollern states she awaken with te neck and right shoulder pain over a week ago. The pain has increased in intensity she denies falling. Also denies SOB or chest pain. We will order X-rays. She would like to have X-rays at [facility]. They are closed at this time. X-ray order will be faxed in the morning she verbalizes understanding. [facility]: [phone]

## 2015-12-18 ENCOUNTER — Ambulatory Visit
Admission: RE | Admit: 2015-12-18 | Discharge: 2015-12-18 | Disposition: A | Discharge status: Home / Self Care | Payer: 59 | Source: Ambulatory Visit | Attending: Registered Nurse | Admitting: Registered Nurse

## 2015-12-18 ENCOUNTER — Other Ambulatory Visit: Payer: Self-pay | Admitting: *Deleted

## 2015-12-18 ENCOUNTER — Telehealth: Payer: Self-pay | Admitting: Registered Nurse

## 2015-12-18 ENCOUNTER — Other Ambulatory Visit: Payer: Self-pay | Admitting: Registered Nurse

## 2015-12-18 DIAGNOSIS — M542 Cervicalgia: Secondary | ICD-10-CM

## 2015-12-18 DIAGNOSIS — M25511 Pain in right shoulder: Secondary | ICD-10-CM | POA: Insufficient documentation

## 2015-12-18 DIAGNOSIS — M40202 Unspecified kyphosis, cervical region: Secondary | ICD-10-CM | POA: Diagnosis not present

## 2015-12-18 DIAGNOSIS — M50322 Other cervical disc degeneration at C5-C6 level: Secondary | ICD-10-CM | POA: Diagnosis not present

## 2015-12-18 NOTE — Telephone Encounter (Signed)
Called Elizabeth Torres this morning, she is aware the X-ray ordered has been placed, she verbalizes understanding.

## 2015-12-18 NOTE — Telephone Encounter (Signed)
Placed a call to Elizabeth Torres. Reviewed X-ray results, she verbalizes understanding.

## 2015-12-31 ENCOUNTER — Other Ambulatory Visit: Payer: Self-pay | Admitting: Physical Medicine & Rehabilitation

## 2016-01-08 ENCOUNTER — Encounter: Payer: 59 | Attending: Physical Medicine and Rehabilitation | Admitting: Registered Nurse

## 2016-01-08 ENCOUNTER — Encounter: Payer: Self-pay | Admitting: Registered Nurse

## 2016-01-08 VITALS — BP 130/89 | HR 83

## 2016-01-08 DIAGNOSIS — M545 Low back pain: Secondary | ICD-10-CM | POA: Diagnosis not present

## 2016-01-08 DIAGNOSIS — M5030 Other cervical disc degeneration, unspecified cervical region: Secondary | ICD-10-CM | POA: Diagnosis not present

## 2016-01-08 DIAGNOSIS — F17210 Nicotine dependence, cigarettes, uncomplicated: Secondary | ICD-10-CM | POA: Insufficient documentation

## 2016-01-08 DIAGNOSIS — T451X5S Adverse effect of antineoplastic and immunosuppressive drugs, sequela: Secondary | ICD-10-CM | POA: Diagnosis not present

## 2016-01-08 DIAGNOSIS — M961 Postlaminectomy syndrome, not elsewhere classified: Secondary | ICD-10-CM | POA: Diagnosis not present

## 2016-01-08 DIAGNOSIS — Z760 Encounter for issue of repeat prescription: Secondary | ICD-10-CM | POA: Insufficient documentation

## 2016-01-08 DIAGNOSIS — M25511 Pain in right shoulder: Secondary | ICD-10-CM

## 2016-01-08 DIAGNOSIS — M79605 Pain in left leg: Secondary | ICD-10-CM | POA: Diagnosis not present

## 2016-01-08 DIAGNOSIS — Z9221 Personal history of antineoplastic chemotherapy: Secondary | ICD-10-CM | POA: Insufficient documentation

## 2016-01-08 DIAGNOSIS — G620 Drug-induced polyneuropathy: Secondary | ICD-10-CM | POA: Insufficient documentation

## 2016-01-08 DIAGNOSIS — M5010 Cervical disc disorder with radiculopathy, unspecified cervical region: Secondary | ICD-10-CM | POA: Diagnosis not present

## 2016-01-08 DIAGNOSIS — G894 Chronic pain syndrome: Secondary | ICD-10-CM

## 2016-01-08 DIAGNOSIS — G8929 Other chronic pain: Secondary | ICD-10-CM | POA: Insufficient documentation

## 2016-01-08 DIAGNOSIS — T451X5A Adverse effect of antineoplastic and immunosuppressive drugs, initial encounter: Secondary | ICD-10-CM

## 2016-01-08 DIAGNOSIS — M79604 Pain in right leg: Secondary | ICD-10-CM | POA: Insufficient documentation

## 2016-01-08 DIAGNOSIS — M47812 Spondylosis without myelopathy or radiculopathy, cervical region: Secondary | ICD-10-CM

## 2016-01-08 MED ORDER — HYDROCODONE-ACETAMINOPHEN 10-325 MG PO TABS: ORAL_TABLET | ORAL | Status: DC

## 2016-01-08 NOTE — Progress Notes (Signed)
Subjective:    Patient ID: Elizabeth Torres, female    DOB: 02-Feb-1976, 40 y.o.   MRN: [id number]  HPI: [name] is a 40 year old female who returns for follow up for chronic pain and medication refill. She states her pain is located in her neck radiating into her right arm, lower back and lower extremities.  [name] would like to see Dr. Joya Salm referral placed. She rates her pain 4. During assessment Ms. Bones becomes tearful with right arm elevation states she is tired of hurting. She was on gabapentin in the past with no relief, we discussed Lyrica she states her insurance wouldn't cover it. We discussed [place], will speak with Dr. Letta Pate and give her a call she verbalizes understanding. Her current exercise regime is walking. She's working 40 hours a week.  Pain Inventory Average Pain 4 Pain Right Now 4 My pain is constant, dull and aching  In the last 24 hours, has pain interfered with the following? General activity 6 Relation with others 5 Enjoyment of life 6 What TIME of day is your pain at its worst? evening Sleep (in general) Poor  Pain is worse with: sitting, standing and some activites Pain improves with: heat/ice and medication Relief from Meds: 4  Mobility ability to climb steps?  yes do you drive?  yes Do you have any goals in this area?  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  no  Neuro/Psych No problems in this area  Prior Studies Any changes since last visit?  yes  Physicians involved in your care Any changes since last visit?  no   Family History  Problem Relation Age of Onset  . Diabetes Mother   . Hypertension Mother   . Cancer Paternal Uncle   . Breast cancer Paternal Uncle     40'S  . Breast cancer Paternal Aunt     69'S   Social History   Social History  . Marital Status: Married    Spouse Name: N/A  . Number of Children: N/A  . Years of Education: N/A     Social History Main Topics  . Smoking status: Current Every Day Smoker -- 1.00 packs/day    Types: Cigarettes  . Smokeless tobacco: Never Used  . Alcohol Use: No  . Drug Use: None  . Sexual Activity: Not Asked   Other Topics Concern  . None   Social History Narrative   Past Surgical History  Procedure Laterality Date  . Spine surgery  2008    Dr Joya Salm  . Mastectomy Right     partial with lymph node dissection  . Breast surgery    . Port a cath revision    . Port-a-cath removal  11-07-15    Dr Bary Castilla   Past Medical History  Diagnosis Date  . Cancer ([place])   . Breast cancer ([place]) 2013    RT LUMPECTOMY  . Radiation 2013    BREAST CA  . Status post chemotherapy 2013    BREAST CA  . BRCA negative    BP 130/89 mmHg  Pulse 83  SpO2 96%  LMP 12/06/2015  Opioid Risk Score:   Fall Risk Score:  `1  Depression screen PHQ 2/9  Depression screen [facility] 2/9 10/10/2015 08/15/2015 04/17/2015 02/19/2015 11/14/2014  Decreased Interest 0 0 0 0 0  Down, Depressed, Hopeless 0 0 0 0 0  PHQ -  2 Score 0 0 0 0 0  Altered sleeping 2 - - 1 1  Tired, decreased energy 1 - - 1 1  Change in appetite 0 - - 0 0  Feeling bad or failure about yourself  0 - - 0 0  Trouble concentrating 0 - - 0 0  Moving slowly or fidgety/restless 0 - - 0 0  Suicidal thoughts - - - 0 0  PHQ-9 Score 3 - - 2 2  Difficult doing work/chores Not difficult at all - - - -     Review of Systems     Objective:   Physical Exam  Constitutional: She is oriented to person, place, and time. She appears well-developed and well-nourished.  HENT:  Head: Normocephalic and atraumatic.  Neck: Normal range of motion. Neck supple.  Cervical Paraspinal Tenderness: C-5- C-6 Mainly Right Side   Cardiovascular: Normal rate and regular rhythm.   Pulmonary/Chest: Effort normal and breath sounds normal.  Musculoskeletal:  Normal Muscle Bulk and Muscle Testing Reveals:  Upper Extremities: Right: Decreased ROM 45 Degrees and Muscle  Strength 4/5 Left: Full ROM and Muscle Strength 5/5 Thoracic Paraspinal Hypersensitivity: T-1- T-3 Lumbar Paraspinal Tenderness: L-4- L-5 Lower Extremities: Full ROM and Muscle Strength 5/5 Arises from chair with ease Narrow Based Gait  Neurological: She is alert and oriented to person, place, and time.  Skin: Skin is warm and dry.  Psychiatric: She has a normal mood and affect.  Nursing note and vitals reviewed.         Assessment & Plan:  1. Lumbar postlaminectomy syndrome status post L5-S1 fusion with chronic S1 radiculopathy  Refilled: Hydrocodone 10/325 mg #120--one every 6 hours as needed for pain. We will continue the opioid monitoring program, this consists of regular clinic visits, examinations, urine drug screen, pill counts as well as use of [place] Controlled Substance reporting System. 2. 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 Continue Pamelor 3. Cervical DDD: Referral Placed to Dr. Joya Salm  15 minutes of face to face time was spent during this visit. All questions were encouraged and answered.  F/U in 43month

## 2016-01-09 ENCOUNTER — Telehealth: Payer: Self-pay | Admitting: Registered Nurse

## 2016-01-09 NOTE — Telephone Encounter (Signed)
Placed a call to Elizabeth Torres no answer, left message to return the call.

## 2016-01-09 NOTE — Telephone Encounter (Signed)
Placed a call to Mrs. Casalino regarding Dr. Letta Pate decision, no answer. Left message to return the call.

## 2016-01-10 ENCOUNTER — Telehealth: Payer: Self-pay | Admitting: Registered Nurse

## 2016-01-10 MED ORDER — PREGABALIN 50 MG PO CAPS: 50.0000 mg | ORAL_CAPSULE | Freq: Two times a day (BID) | ORAL | Status: DC

## 2016-01-10 MED ORDER — METHYLPREDNISOLONE 4 MG PO TBPK
ORAL_TABLET | ORAL | Status: DC
Start: 2016-01-10 — End: 2016-02-14

## 2016-01-10 NOTE — Telephone Encounter (Signed)
Placed a call to Ms. Perras. No answer. Lyrica was called in and Medrol dose pak ordered.

## 2016-02-14 ENCOUNTER — Other Ambulatory Visit: Payer: Self-pay | Admitting: Registered Nurse

## 2016-02-14 ENCOUNTER — Encounter: Payer: 59 | Attending: Physical Medicine and Rehabilitation

## 2016-02-14 ENCOUNTER — Encounter: Payer: Self-pay | Admitting: Physical Medicine & Rehabilitation

## 2016-02-14 ENCOUNTER — Ambulatory Visit (HOSPITAL_BASED_OUTPATIENT_CLINIC_OR_DEPARTMENT_OTHER): Payer: 59 | Admitting: Physical Medicine & Rehabilitation

## 2016-02-14 VITALS — BP 121/86 | HR 83 | Resp 14

## 2016-02-14 DIAGNOSIS — M961 Postlaminectomy syndrome, not elsewhere classified: Secondary | ICD-10-CM | POA: Insufficient documentation

## 2016-02-14 DIAGNOSIS — Z760 Encounter for issue of repeat prescription: Secondary | ICD-10-CM | POA: Diagnosis present

## 2016-02-14 DIAGNOSIS — M79604 Pain in right leg: Secondary | ICD-10-CM | POA: Diagnosis not present

## 2016-02-14 DIAGNOSIS — M791 Myalgia: Secondary | ICD-10-CM | POA: Diagnosis not present

## 2016-02-14 DIAGNOSIS — Z9221 Personal history of antineoplastic chemotherapy: Secondary | ICD-10-CM | POA: Insufficient documentation

## 2016-02-14 DIAGNOSIS — G8929 Other chronic pain: Secondary | ICD-10-CM | POA: Diagnosis not present

## 2016-02-14 DIAGNOSIS — F17210 Nicotine dependence, cigarettes, uncomplicated: Secondary | ICD-10-CM | POA: Insufficient documentation

## 2016-02-14 DIAGNOSIS — M79605 Pain in left leg: Secondary | ICD-10-CM | POA: Diagnosis not present

## 2016-02-14 DIAGNOSIS — M7918 Myalgia, other site: Secondary | ICD-10-CM | POA: Insufficient documentation

## 2016-02-14 DIAGNOSIS — G620 Drug-induced polyneuropathy: Secondary | ICD-10-CM | POA: Diagnosis not present

## 2016-02-14 DIAGNOSIS — M545 Low back pain: Secondary | ICD-10-CM | POA: Insufficient documentation

## 2016-02-14 DIAGNOSIS — T451X5S Adverse effect of antineoplastic and immunosuppressive drugs, sequela: Secondary | ICD-10-CM | POA: Insufficient documentation

## 2016-02-14 MED ORDER — HYDROCODONE-ACETAMINOPHEN 10-325 MG PO TABS: ORAL_TABLET | ORAL | Status: DC

## 2016-02-14 MED ORDER — NORTRIPTYLINE HCL 10 MG PO CAPS: 20.0000 mg | ORAL_CAPSULE | Freq: Every day | ORAL | Status: DC

## 2016-02-14 MED ORDER — METHYLPREDNISOLONE 4 MG PO TBPK: ORAL_TABLET | ORAL | Status: DC

## 2016-02-14 NOTE — Progress Notes (Signed)
Subjective:    Patient ID: Elizabeth Torres, female    DOB: February 07, 1976, 40 y.o.   MRN: [id number] L5-S1 laminectomy and diskectomy  for herniated nucleus pulposus on Dec 13, 2009. She had a recurrent  disk at that same level and underwent an L5-S1 fusion on January 07, 2010.  Interval medical history positive for diagnosis of right breast  carcinoma. She has undergone lumpectomy as well as a partial mastectomy  on July 26, 2011. She had 16 chemotherapy treatments  , followed by radiation therapy 5 days per week  for 7 wks.  Completed radiation sept 25 2013  HPI  Patient's main complaint over the last month or 2 has been right-sided neck pain. She does not have any pain that goes into her hand. She does not have any numbness or tingling into the shoulder. She does not complain of any weakness in the right arm.  Has not tried any physical therapy. He has not tried any exercise. She continues on her narcotic analgesic medications, but this does not seem to help a lot. She has pain when she tries to sleep on her right side. No falls or trauma.  Pain Inventory Average Pain 5 Pain Right Now 5 My pain is constant and aching  In the last 24 hours, has pain interfered with the following? General activity 6 Relation with others 5 Enjoyment of life 5 What TIME of day is your pain at its worst? evening Sleep (in general) Poor  Pain is worse with: sitting Pain improves with: rest and medication Relief from Meds: 6  Mobility Do you have any goals in this area?  no  Function Do you have any goals in this area?  no  Neuro/Psych No problems in this area  Prior Studies Any changes since last visit?  no  Physicians involved in your care Any changes since last visit?  no   Family History  Problem Relation Age of Onset  . Diabetes Mother   . Hypertension Mother   . Cancer Paternal Uncle   . Breast cancer Paternal Uncle     40'S  . Breast cancer Paternal Aunt     62'S   Social  History   Social History  . Marital Status: Married    Spouse Name: N/A  . Number of Children: N/A  . Years of Education: N/A   Social History Main Topics  . Smoking status: Current Every Day Smoker -- 1.00 packs/day    Types: Cigarettes  . Smokeless tobacco: Never Used  . Alcohol Use: No  . Drug Use: None  . Sexual Activity: Not Asked   Other Topics Concern  . None   Social History Narrative   Past Surgical History  Procedure Laterality Date  . Spine surgery  2008    Dr Joya Salm  . Mastectomy Right     partial with lymph node dissection  . Breast surgery    . Port a cath revision    . Port-a-cath removal  11-07-15    Dr Bary Castilla   Past Medical History  Diagnosis Date  . Cancer ([place])   . Breast cancer ([place]) 2013    RT LUMPECTOMY  . Radiation 2013    BREAST CA  . Status post chemotherapy 2013    BREAST CA  . BRCA negative    BP 121/86 mmHg  Pulse 83  Resp 14  SpO2 94%  Opioid Risk Score:   Fall Risk Score:  `1  Depression screen [facility] 2/9  Depression screen [facility] 2/9 10/10/2015 08/15/2015 04/17/2015 02/19/2015 11/14/2014  Decreased Interest 0 0 0 0 0  Down, Depressed, Hopeless 0 0 0 0 0  PHQ - 2 Score 0 0 0 0 0  Altered sleeping 2 - - 1 1  Tired, decreased energy 1 - - 1 1  Change in appetite 0 - - 0 0  Feeling bad or failure about yourself  0 - - 0 0  Trouble concentrating 0 - - 0 0  Moving slowly or fidgety/restless 0 - - 0 0  Suicidal thoughts - - - 0 0  PHQ-9 Score 3 - - 2 2  Difficult doing work/chores Not difficult at all - - - -     Review of Systems  Constitutional: Negative.   HENT: Negative.   Eyes: Negative.   Respiratory: Negative.   Cardiovascular: Negative.   Gastrointestinal: Negative.   Endocrine: Negative.   Genitourinary: Negative.   Musculoskeletal: Positive for back pain.  Skin: Negative.   Allergic/Immunologic: Negative.   Neurological: Negative.   Hematological: Negative.   Psychiatric/Behavioral: Negative.   All other systems  reviewed and are negative.      Objective:   Physical Exam  Constitutional: She appears well-developed and well-nourished.  HENT:  Head: Normocephalic and atraumatic.  Nursing note and vitals reviewed.  Tenderness to palpation in the right trapezius muscle. She has pain with right lateral bending of the neck also limited motion with extension, flexion and rotation towards the right and to the left side. She has approximately 50% range. She has 5/5 strength, bilateral deltoid, biceps, triceps, grip Deep tendon reflexes are normal in the upper extremity. Sensation intact to light touch and pinprick in the right upper extremity. Her back is no tenderness. Patient to palpation. Lumbar spine. Gait is normal. Mood and affect are appropriate       Assessment & Plan:  1. Lumbar postlaminectomy syndrome. Her symptoms are well controlled on the current pain medication regimen Hydrocodone 10 mg 4 times per day Pamelor 10 mg daily at bedtime  2. Neck pain appears to be myofascial. We will increase Pamelor to 20 g daily at bedtime We'll do trigger point injection, right upper trapezius muscle.  Trigger Point Injection  Indication: Right trapezius Myofascial pain not relieved by medication management and other conservative care.  Informed consent was obtained after describing risk and benefits of the procedure with the patient, this includes bleeding, bruising, infection and medication side effects.  The patient wishes to proceed and has given written consent.  The patient was placed in a Seated position.  The Right trapezius  area was marked and prepped with Betadine.  It was entered with a 25-gauge 1-1/2 inch needle and 1 mL of 1% lidocaine was injected into each of 2 trigger points, after negative draw back for blood.  The patient tolerated the procedure well.  Post procedure instructions were given.

## 2016-02-14 NOTE — Patient Instructions (Addendum)
Trigger point injection Increased nortriptyline Cortisone 1 wk pack

## 2016-03-13 ENCOUNTER — Encounter: Payer: 59 | Attending: Physical Medicine and Rehabilitation | Admitting: Registered Nurse

## 2016-03-13 ENCOUNTER — Encounter: Payer: Self-pay | Admitting: Registered Nurse

## 2016-03-13 VITALS — BP 144/77 | HR 78 | Resp 16

## 2016-03-13 DIAGNOSIS — M545 Low back pain: Secondary | ICD-10-CM | POA: Diagnosis not present

## 2016-03-13 DIAGNOSIS — M5416 Radiculopathy, lumbar region: Secondary | ICD-10-CM

## 2016-03-13 DIAGNOSIS — M79604 Pain in right leg: Secondary | ICD-10-CM | POA: Insufficient documentation

## 2016-03-13 DIAGNOSIS — F17210 Nicotine dependence, cigarettes, uncomplicated: Secondary | ICD-10-CM | POA: Insufficient documentation

## 2016-03-13 DIAGNOSIS — T451X5S Adverse effect of antineoplastic and immunosuppressive drugs, sequela: Secondary | ICD-10-CM | POA: Diagnosis not present

## 2016-03-13 DIAGNOSIS — M79605 Pain in left leg: Secondary | ICD-10-CM | POA: Insufficient documentation

## 2016-03-13 DIAGNOSIS — G894 Chronic pain syndrome: Secondary | ICD-10-CM

## 2016-03-13 DIAGNOSIS — G620 Drug-induced polyneuropathy: Secondary | ICD-10-CM | POA: Insufficient documentation

## 2016-03-13 DIAGNOSIS — Z760 Encounter for issue of repeat prescription: Secondary | ICD-10-CM | POA: Insufficient documentation

## 2016-03-13 DIAGNOSIS — M961 Postlaminectomy syndrome, not elsewhere classified: Secondary | ICD-10-CM | POA: Diagnosis not present

## 2016-03-13 DIAGNOSIS — G8929 Other chronic pain: Secondary | ICD-10-CM | POA: Diagnosis not present

## 2016-03-13 DIAGNOSIS — T451X5A Adverse effect of antineoplastic and immunosuppressive drugs, initial encounter: Secondary | ICD-10-CM

## 2016-03-13 DIAGNOSIS — Z9221 Personal history of antineoplastic chemotherapy: Secondary | ICD-10-CM | POA: Insufficient documentation

## 2016-03-13 NOTE — Progress Notes (Signed)
Subjective:    Patient ID: LENEA BYWATER, female    DOB: Aug 14, 1975, 40 y.o.   MRN: [id number]  HPI:  Ms. SHANDIIN EISENBEIS is a 40 year old female who returns for follow up for chronic pain and medication refill. She states her pain is located in her lower back radiating into her lower extremities posteriorly. She rates her pain 4. Her current exercise regime is walking and performing stretching exercises. She's working 40 hours a week.  Pain Inventory Average Pain 5 Pain Right Now 4 My pain is constant, dull and aching  In the last 24 hours, has pain interfered with the following? General activity 4 Relation with others 4 Enjoyment of life 4 What TIME of day is your pain at its worst? evening Sleep (in general) Poor  Pain is worse with: sitting, inactivity and standing Pain improves with: medication Relief from Meds: 5  Mobility walk without assistance  Function Do you have any goals in this area?  no  Neuro/Psych No problems in this area  Prior Studies Any changes since last visit?  no  Physicians involved in your care Any changes since last visit?  no   Family History  Problem Relation Age of Onset  . Diabetes Mother   . Hypertension Mother   . Cancer Paternal Uncle   . Breast cancer Paternal Uncle     40'S  . Breast cancer Paternal Aunt     15'S   Social History   Social History  . Marital status: Married    Spouse name: N/A  . Number of children: N/A  . Years of education: N/A   Social History Main Topics  . Smoking status: Current Every Day Smoker    Packs/day: 1.00    Types: Cigarettes  . Smokeless tobacco: Never Used  . Alcohol use No  . Drug use: Unknown  . Sexual activity: Not on file   Other Topics Concern  . Not on file   Social History Narrative  . No narrative on file   Past Surgical History:  Procedure Laterality Date  . BREAST SURGERY    . mastectomy Right    partial with lymph node dissection  . PORT A CATH REVISION    .  PORT-A-CATH REMOVAL  11-07-15   Dr Bary Castilla  . SPINE SURGERY  2008   Dr Joya Salm   Past Medical History:  Diagnosis Date  . BRCA negative   . Breast cancer ([place]) 2013   RT LUMPECTOMY  . Cancer ([place])   . Radiation 2013   BREAST CA  . Status post chemotherapy 2013   BREAST CA   BP (!) 144/77 (BP Location: Left Arm, Patient Position: Sitting, Cuff Size: Normal)   Pulse 78   Resp 16   SpO2 98%   Opioid Risk Score:   Fall Risk Score:  `1  Depression screen [facility] 2/9  Depression screen [facility] 2/9 03/13/2016 10/10/2015 08/15/2015 04/17/2015 02/19/2015 11/14/2014  Decreased Interest 0 0 0 0 0 0  Down, Depressed, Hopeless 0 0 0 0 0 0  PHQ - 2 Score 0 0 0 0 0 0  Altered sleeping - 2 - - 1 1  Tired, decreased energy - 1 - - 1 1  Change in appetite - 0 - - 0 0  Feeling bad or failure about yourself  - 0 - - 0 0  Trouble concentrating - 0 - - 0 0  Moving slowly or fidgety/restless - 0 - - 0 0  Suicidal thoughts - - - - 0 0  PHQ-9 Score - 3 - - 2 2  Difficult doing work/chores - Not difficult at all - - - -    Review of Systems  All other systems reviewed and are negative.      Objective:   Physical Exam  Constitutional: She is oriented to person, place, and time. She appears well-developed and well-nourished.  HENT:  Head: Normocephalic and atraumatic.  Neck: Normal range of motion. Neck supple.  Cardiovascular: Normal rate and regular rhythm.   Pulmonary/Chest: Effort normal and breath sounds normal.  Musculoskeletal:  Normal Muscle Bulk and Muscle TestingReveals: Upper Extremities: Full ROM and Muscle Strength 5/5 Back without spinal tenderness Lower Extremities: Full ROM and Muscle Strength 5/5 Arises from chair with ease Narrow Based gait  Neurological: She is alert and oriented to person, place, and time.  Skin: Skin is warm and dry.  Psychiatric: She has a normal mood and affect.  Nursing note and vitals reviewed.         Assessment & Plan:  1. Lumbar postlaminectomy  syndrome status post L5-S1 fusion with chronic S1 radiculopathy  Continue: Hydrocodone 10/325 mg #120--one every 6 hours as needed for pain. We will continue the opioid monitoring program, this consists of regular clinic visits, examinations, urine drug screen, pill counts as well as use of [place] Controlled Substance reporting System. 2. 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 Insomnia: Continue Ambien  15 minutes of face to face time was spent during this visit. All questions were encouraged and answered.  F/U in 90month

## 2016-04-10 ENCOUNTER — Encounter: Payer: Self-pay | Admitting: Registered Nurse

## 2016-04-10 ENCOUNTER — Encounter: Payer: 59 | Attending: Physical Medicine and Rehabilitation | Admitting: Registered Nurse

## 2016-04-10 VITALS — BP 131/86 | HR 87 | Resp 14

## 2016-04-10 DIAGNOSIS — G620 Drug-induced polyneuropathy: Secondary | ICD-10-CM

## 2016-04-10 DIAGNOSIS — T451X5S Adverse effect of antineoplastic and immunosuppressive drugs, sequela: Secondary | ICD-10-CM | POA: Insufficient documentation

## 2016-04-10 DIAGNOSIS — M961 Postlaminectomy syndrome, not elsewhere classified: Secondary | ICD-10-CM

## 2016-04-10 DIAGNOSIS — F17210 Nicotine dependence, cigarettes, uncomplicated: Secondary | ICD-10-CM | POA: Diagnosis not present

## 2016-04-10 DIAGNOSIS — M79604 Pain in right leg: Secondary | ICD-10-CM | POA: Insufficient documentation

## 2016-04-10 DIAGNOSIS — G8929 Other chronic pain: Secondary | ICD-10-CM | POA: Diagnosis not present

## 2016-04-10 DIAGNOSIS — Z760 Encounter for issue of repeat prescription: Secondary | ICD-10-CM | POA: Insufficient documentation

## 2016-04-10 DIAGNOSIS — M5416 Radiculopathy, lumbar region: Secondary | ICD-10-CM

## 2016-04-10 DIAGNOSIS — G894 Chronic pain syndrome: Secondary | ICD-10-CM

## 2016-04-10 DIAGNOSIS — M545 Low back pain: Secondary | ICD-10-CM | POA: Insufficient documentation

## 2016-04-10 DIAGNOSIS — M79605 Pain in left leg: Secondary | ICD-10-CM | POA: Diagnosis not present

## 2016-04-10 DIAGNOSIS — Z79899 Other long term (current) drug therapy: Secondary | ICD-10-CM

## 2016-04-10 DIAGNOSIS — T451X5A Adverse effect of antineoplastic and immunosuppressive drugs, initial encounter: Secondary | ICD-10-CM

## 2016-04-10 DIAGNOSIS — Z9221 Personal history of antineoplastic chemotherapy: Secondary | ICD-10-CM | POA: Diagnosis not present

## 2016-04-10 DIAGNOSIS — Z5181 Encounter for therapeutic drug level monitoring: Secondary | ICD-10-CM

## 2016-04-10 MED ORDER — HYDROCODONE-ACETAMINOPHEN 10-325 MG PO TABS: ORAL_TABLET | ORAL | 0 refills | Status: DC

## 2016-04-10 NOTE — Addendum Note (Signed)
Addended by: Geryl Rankins D on: 04/10/2016 02:15 PM   Modules accepted: Orders

## 2016-04-10 NOTE — Progress Notes (Signed)
Subjective:    Patient ID: Elizabeth Torres, female    DOB: [birth year], 40 y.o.   MRN: [id number]  HPI: Elizabeth Torres is a 40 year old female who returns for follow up for chronic pain and medication refill. She states her pain is located in her lower back radiating into her lower extremities posteriorly. She rates her pain 4. Her current exercise regime is walking and performing stretching exercises. She's working 40 hours a week.  Pain Inventory Average Pain 4 Pain Right Now 4 My pain is constant, dull and aching  In the last 24 hours, has pain interfered with the following? General activity 4 Relation with others 3 Enjoyment of life 3 What TIME of day is your pain at its worst? evening Sleep (in general) Fair  Pain is worse with: sitting, inactivity and standing Pain improves with: rest and medication Relief from Meds: 5  Mobility walk without assistance Do you have any goals in this area?  no  Function Do you have any goals in this area?  no  Neuro/Psych No problems in this area  Prior Studies Any changes since last visit?  no  Physicians involved in your care Any changes since last visit?  no   Family History  Problem Relation Age of Onset  . Diabetes Mother   . Hypertension Mother   . Cancer Paternal Uncle   . Breast cancer Paternal Uncle     40'S  . Breast cancer Paternal Aunt     97'S   Social History   Social History  . Marital status: Married    Spouse name: N/A  . Number of children: N/A  . Years of education: N/A   Social History Main Topics  . Smoking status: Current Every Day Smoker    Packs/day: 1.00    Types: Cigarettes  . Smokeless tobacco: Never Used  . Alcohol use No  . Drug use: Unknown  . Sexual activity: Not Asked   Other Topics Concern  . None   Social History Narrative  . None   Past Surgical History:  Procedure Laterality Date  . BREAST SURGERY    . mastectomy Right    partial with lymph node dissection  . PORT  A CATH REVISION    . PORT-A-CATH REMOVAL  11-07-15   Dr Bary Castilla  . SPINE SURGERY  2008   Dr Joya Salm   Past Medical History:  Diagnosis Date  . BRCA negative   . Breast cancer ([place]) 2013   RT LUMPECTOMY  . Cancer ([place])   . Radiation 2013   BREAST CA  . Status post chemotherapy 2013   BREAST CA   BP 131/86 (BP Location: Left Arm, Patient Position: Sitting, Cuff Size: Large)   Pulse 87   Resp 14   SpO2 95%   Opioid Risk Score:   Fall Risk Score:  `1  Depression screen [facility] 2/9  Depression screen [facility] 2/9 03/13/2016 10/10/2015 08/15/2015 04/17/2015 02/19/2015 11/14/2014  Decreased Interest 0 0 0 0 0 0  Down, Depressed, Hopeless 0 0 0 0 0 0  PHQ - 2 Score 0 0 0 0 0 0  Altered sleeping - 2 - - 1 1  Tired, decreased energy - 1 - - 1 1  Change in appetite - 0 - - 0 0  Feeling bad or failure about yourself  - 0 - - 0 0  Trouble concentrating - 0 - - 0 0  Moving slowly or fidgety/restless - 0 - -  0 0  Suicidal thoughts - - - - 0 0  PHQ-9 Score - 3 - - 2 2  Difficult doing work/chores - Not difficult at all - - - -    Review of Systems  Constitutional: Negative.   HENT: Negative.   Eyes: Negative.   Respiratory: Negative.   Cardiovascular: Negative.   Gastrointestinal: Negative.   Endocrine: Negative.   Genitourinary: Negative.   Musculoskeletal: Positive for back pain and myalgias.  Allergic/Immunologic: Negative.   Neurological: Negative.   Hematological: Negative.   Psychiatric/Behavioral: Negative.   All other systems reviewed and are negative.      Objective:   Physical Exam  Constitutional: She is oriented to person, place, and time. She appears well-developed and well-nourished.  HENT:  Head: Normocephalic and atraumatic.  Neck: Normal range of motion. Neck supple.  Cardiovascular: Normal rate and regular rhythm.   Pulmonary/Chest: Effort normal and breath sounds normal.  Musculoskeletal:  Normal Muscle Bulk and Muscle Testing Reveals: Upper Extremities: Full ROM  and Muscle Strength 5/5 Lumbar Paraspinal Tenderness: L-3- L-5 Lower Extremities: Full ROM and Muscle Strength 5/5 Arises from Table with ease Narrow Based Gait  Neurological: She is alert and oriented to person, place, and time.  Skin: Skin is warm and dry.  Psychiatric: She has a normal mood and affect.  Nursing note and vitals reviewed.         Assessment & Plan:  1. Lumbar postlaminectomy syndrome status post L5-S1 fusion with chronic S1 radiculopathy  Continue: Hydrocodone 10/325 mg #120--one every 6 hours as needed for pain. We will continue the opioid monitoring program, this consists of regular clinic visits, examinations, urine drug screen, pill counts as well as use of [place] Controlled Substance reporting System. 2. 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 Insomnia: Continue Ambien  15 minutes of face to face time was spent during this visit. All questions were encouraged and answered.  F/U in 75month

## 2016-04-17 LAB — TOXASSURE SELECT,+ANTIDEPR,UR

## 2016-04-22 NOTE — Progress Notes (Signed)
Urine drug screen for this encounter is consistent for prescribed medications.   

## 2016-04-23 NOTE — Progress Notes (Deleted)
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   INTERVAL HISTORY: Patient last evaluated in clinic in December 2014.  She returns to clinic today for routine followup and further evaluation.  She has some mild tenderness at her surgical site on her right breast, but otherwise feels well.  She currently feels well and is asymptomatic.  She has no neurologic complaints.  She denies any recent fevers. She has a good appetite and denies weight loss. She denies any chest pain or shortness of breath.  She has no nausea, vomiting, constipation, or diarrhea.  She has no urinary complaints.  Patient offers no further specific complaints today.  REVIEW OF SYSTEMS:   Review of Systems  Constitutional: Negative.  Negative for fever, malaise/fatigue and weight loss.  Respiratory: Negative.  Negative for shortness of breath.   Cardiovascular: Negative.  Negative for chest pain.  Gastrointestinal: Negative.  Negative for abdominal pain.  Musculoskeletal: Negative.   Neurological: Negative.  Negative for weakness.    As per HPI. Otherwise, a complete review of systems is negatve.  PAST MEDICAL HISTORY: Past Medical History:  Diagnosis Date  . BRCA negative   . Breast cancer ([place]) 2013   RT LUMPECTOMY  . Cancer ([place])   . Radiation 2013   BREAST CA  . Status post chemotherapy 2013   BREAST CA    PAST SURGICAL HISTORY: Past Surgical History:  Procedure Laterality Date  . BREAST SURGERY    . mastectomy Right    partial with lymph node dissection  . PORT A CATH REVISION    . PORT-A-CATH REMOVAL  11-07-15   Dr Bary Castilla  .  SPINE SURGERY  2008   Dr Joya Salm    FAMILY HISTORY Family History  Problem Relation Age of Onset  . Diabetes Mother   . Hypertension Mother   . Cancer Paternal Uncle   . Breast cancer Paternal Uncle     40'S  . Breast cancer Paternal Aunt     27'S       ADVANCED DIRECTIVES:    HEALTH MAINTENANCE: Social History  Substance Use Topics  . Smoking status: Current Every Day Smoker    Packs/day: 1.00    Types: Cigarettes  . Smokeless tobacco: Never Used  . Alcohol use No     Colonoscopy:  PAP:  Bone density:  Lipid panel:  Allergies  Allergen Reactions  . Penicillins Anaphylaxis  . Sulfa Antibiotics Anaphylaxis  . Neurontin [Gabapentin] Other (See Comments)    lethargy  . Ondansetron     Other reaction(s): Other (See Comments) migraines  . Zofran [Ondansetron Hcl]     Current Outpatient Prescriptions  Medication Sig Dispense Refill  . HYDROcodone-acetaminophen (NORCO) 10-325 MG tablet TAKE 1 TABLET BY MOUTH EVERY 6 HOURS AS NEEDED FOR PAIN 120 tablet 0  . ibuprofen (ADVIL,MOTRIN) 800 MG tablet Take 800 mg by mouth every 6 (six) hours as needed (pain).     . nortriptyline (PAMELOR) 10 MG capsule Take 2 capsules (20 mg total) by mouth at bedtime. 30 capsule 3  . pregabalin (LYRICA) 50 MG capsule Take 1 capsule (50 mg total) by mouth 2 (two) times daily. 60 capsule  2  . zolpidem (AMBIEN) 5 MG tablet TAKE 1 TABLET BY MOUTH AT BEDTIME AS NEEDED 30 tablet 2   No current facility-administered medications for this visit.     OBJECTIVE: There were no vitals filed for this visit.   There is no height or weight on file to calculate BMI.    ECOG FS:0 - Asymptomatic  General: Well-developed, well-nourished, no acute distress. Eyes: Pink conjunctiva, anicteric sclera. HEENT: Normocephalic, moist mucous membranes, clear oropharnyx. Breasts: Bilateral breast and axilla without lumps or masses. Lungs: Clear to auscultation bilaterally. Heart: Regular rate and rhythm. No  rubs, murmurs, or gallops. Abdomen: Soft, nontender, nondistended. No organomegaly noted, normoactive bowel sounds. Musculoskeletal: No edema, cyanosis, or clubbing. Neuro: Alert, answering all questions appropriately. Cranial nerves grossly intact. Skin: No rashes or petechiae noted. Psych: Normal affect. Lymphatics: No cervical, calvicular, axillary or inguinal LAD.   LAB RESULTS:  Lab Results  Component Value Date   NA 136 02/19/2015   K 3.8 02/19/2015   CL 102 02/19/2015   CO2 28 02/19/2015   GLUCOSE 100 (H) 02/19/2015   BUN 7 02/19/2015   CREATININE 0.72 02/19/2015   CALCIUM 9.4 02/19/2015   PROT 6.3 (L) 02/19/2015   ALBUMIN 3.9 02/19/2015   AST 27 02/19/2015   ALT 23 02/19/2015   ALKPHOS 59 02/19/2015   BILITOT 0.7 02/19/2015   GFRNONAA >60 02/19/2015   GFRAA >60 02/19/2015    Lab Results  Component Value Date   WBC 8.3 02/19/2015   NEUTROABS 4.5 02/19/2015   HGB 14.2 02/19/2015   HCT 41.3 02/19/2015   MCV 93.0 02/19/2015   PLT 233 02/19/2015     STUDIES: No results found.  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   PLAN:    1.  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  Patient completed chemotherapy with AC-Taxol in July 2013. She completed adjuvant XRT in October 2013. Patient has not had a mammogram in greater than 2 years therefore will schedule one in the next 1-2 weeks. CA-27-29 is pending from today.  She does not require tamoxifen given the ER/PR status of her tumor. Return to clinic in 6 months for routine evaluation.  2.  Peripheral neuropathy: Resolved. 3.  Port: Patient has not had her port flushed since her last visit greater than 2 years ago. Will make referral to surgery for removal.  Patient expressed understanding and was in agreement with this plan. She also understands that She can call clinic at any time with any  questions, concerns, or complaints.   Lloyd Huger, MD   04/23/2016 10:53 PM

## 2016-04-24 ENCOUNTER — Inpatient Hospital Stay: Payer: 59

## 2016-04-24 ENCOUNTER — Ambulatory Visit
Admission: EM | Admit: 2016-04-24 | Discharge: 2016-04-24 | Disposition: A | Discharge status: Home / Self Care | Payer: 59 | Attending: Family Medicine | Admitting: Family Medicine

## 2016-04-24 ENCOUNTER — Inpatient Hospital Stay: Payer: 59 | Admitting: Oncology

## 2016-04-24 DIAGNOSIS — Z720 Tobacco use: Secondary | ICD-10-CM | POA: Diagnosis not present

## 2016-04-24 DIAGNOSIS — J40 Bronchitis, not specified as acute or chronic: Secondary | ICD-10-CM

## 2016-04-24 DIAGNOSIS — F17200 Nicotine dependence, unspecified, uncomplicated: Secondary | ICD-10-CM

## 2016-04-24 DIAGNOSIS — R059 Cough, unspecified: Secondary | ICD-10-CM

## 2016-04-24 DIAGNOSIS — R05 Cough: Secondary | ICD-10-CM

## 2016-04-24 MED ORDER — ALBUTEROL SULFATE HFA 108 (90 BASE) MCG/ACT IN AERS: 2.0000 | INHALATION_SPRAY | Freq: Four times a day (QID) | RESPIRATORY_TRACT | 0 refills | Status: DC | PRN

## 2016-04-24 MED ORDER — PREDNISONE 20 MG PO TABS: ORAL_TABLET | ORAL | 0 refills | Status: DC

## 2016-04-24 MED ORDER — DOXYCYCLINE HYCLATE 100 MG PO TABS
100.0000 mg | ORAL_TABLET | Freq: Two times a day (BID) | ORAL | 0 refills | Status: DC
Start: 2016-04-24 — End: 2016-06-05

## 2016-04-24 MED ORDER — IPRATROPIUM-ALBUTEROL 0.5-2.5 (3) MG/3ML IN SOLN
3.0000 mL | Freq: Once | RESPIRATORY_TRACT | Status: AC
  Administered 2016-04-24: 3 mL via RESPIRATORY_TRACT

## 2016-04-24 NOTE — ED Provider Notes (Signed)
[facility]    CSN: [id number] Arrival date & time: 04/24/16  8768  First Provider Contact:  None       History   Chief Complaint Chief Complaint  Patient presents with  . Shortness of Breath    HPI Elizabeth Torres is a 40 y.o. female.   The history is provided by the patient.  URI  Presenting symptoms: congestion, cough, fatigue and fever   Severity:  Moderate Onset quality:  Sudden Duration:  2 days Timing:  Constant Progression:  Worsening Chronicity:  New Relieved by:  None tried Associated symptoms: wheezing   Risk factors: sick contacts   Risk factors: not elderly, no chronic cardiac disease, no chronic kidney disease, no diabetes mellitus, no immunosuppression, no recent illness and no recent travel  Chronic respiratory disease: unknown, however patient is a chronic smoker.     Past Medical History:  Diagnosis Date  . BRCA negative   . Breast cancer ([place]) 2013   RT LUMPECTOMY  . Cancer ([place])   . Radiation 2013   BREAST CA  . Status post chemotherapy 2013   BREAST CA    Patient Active Problem List   Diagnosis Date Noted  . Cervical myofascial pain syndrome 02/14/2016  . History of breast cancer 11/07/2015  . Postlaminectomy syndrome, lumbar region 04/12/2012  . Chemotherapy-induced neuropathy ([place]) 04/12/2012  . Primary cancer of lower outer quadrant of left female breast ([place]) 08/07/2011    Past Surgical History:  Procedure Laterality Date  . BREAST SURGERY    . mastectomy Right    partial with lymph node dissection  . PORT A CATH REVISION    . PORT-A-CATH REMOVAL  11-07-15   Dr Bary Castilla  . SPINE SURGERY  2008   Dr Joya Salm    OB History    No data available       Home Medications    Prior to Admission medications   Medication Sig Start Date End Date Taking? Authorizing Provider  HYDROcodone-acetaminophen (NORCO) 10-325 MG tablet TAKE 1 TABLET BY MOUTH EVERY 6 HOURS AS NEEDED FOR PAIN 04/10/16  Yes Bayard Hugger, NP    ibuprofen (ADVIL,MOTRIN) 800 MG tablet Take 800 mg by mouth every 6 (six) hours as needed (pain).  04/19/13  Yes Santiago Glad Prueter, PA-C  nortriptyline (PAMELOR) 10 MG capsule Take 2 capsules (20 mg total) by mouth at bedtime. 02/14/16  Yes Charlett Blake, MD  zolpidem (AMBIEN) 5 MG tablet TAKE 1 TABLET BY MOUTH AT BEDTIME AS NEEDED 02/14/16  Yes Bayard Hugger, NP  albuterol (PROVENTIL HFA;VENTOLIN HFA) 108 (90 Base) MCG/ACT inhaler Inhale 2 puffs into the lungs every 6 (six) hours as needed for wheezing or shortness of breath. 04/24/16   Norval Gable, MD  doxycycline (VIBRA-TABS) 100 MG tablet Take 1 tablet (100 mg total) by mouth 2 (two) times daily. 04/24/16   Norval Gable, MD  predniSONE (DELTASONE) 20 MG tablet 1 tab po qd 04/24/16   Norval Gable, MD  pregabalin (LYRICA) 50 MG capsule Take 1 capsule (50 mg total) by mouth 2 (two) times daily. 01/10/16   Bayard Hugger, NP    Family History Family History  Problem Relation Age of Onset  . Diabetes Mother   . Hypertension Mother   . Cancer Paternal Uncle   . Breast cancer Paternal Uncle     40'S  . Breast cancer Paternal Aunt     97'S    Social History Social History  Substance Use Topics  .  Smoking status: Current Every Day Smoker    Packs/day: 1.00    Types: Cigarettes  . Smokeless tobacco: Never Used  . Alcohol use Yes     Comment: socially     Allergies   Penicillins; Sulfa antibiotics; Neurontin [gabapentin]; Ondansetron; and Zofran [ondansetron hcl]   Review of Systems Review of Systems  Constitutional: Positive for fatigue and fever.  HENT: Positive for congestion.   Respiratory: Positive for cough and wheezing.      Physical Exam Triage Vital Signs ED Triage Vitals  Enc Vitals Group     BP 04/24/16 1046 120/86     Pulse Rate 04/24/16 1046 95     Resp 04/24/16 1046 16     Temp 04/24/16 1046 97.9 F (36.6 C)     Temp Source 04/24/16 1046 Tympanic     SpO2 04/24/16 1046 98 %     Weight 04/24/16 1045 155  lb (70.3 kg)     Height 04/24/16 1045 '5\' 3"'  (1.6 m)     Head Circumference --      Peak Flow --      Pain Score 04/24/16 1047 7     Pain Loc --      Pain Edu? --      Excl. in [place]? --    No data found.   Updated Vital Signs BP 120/86 (BP Location: Left Arm)   Pulse 82   Temp 97.9 F (36.6 C) (Tympanic)   Resp 16   Ht '5\' 3"'  (1.6 m)   Wt 155 lb (70.3 kg)   LMP 04/12/2016   SpO2 100%   BMI 27.46 kg/m   Visual Acuity Right Eye Distance:   Left Eye Distance:   Bilateral Distance:    Right Eye Near:   Left Eye Near:    Bilateral Near:     Physical Exam  Constitutional: She appears well-developed and well-nourished. No distress.  HENT:  Head: Normocephalic and atraumatic.  Right Ear: Tympanic membrane, external ear and ear canal normal.  Left Ear: Tympanic membrane, external ear and ear canal normal.  Nose: Rhinorrhea present. No nose lacerations, sinus tenderness, nasal deformity, septal deviation or nasal septal hematoma. No epistaxis.  No foreign bodies. Right sinus exhibits no maxillary sinus tenderness and no frontal sinus tenderness. Left sinus exhibits no maxillary sinus tenderness and no frontal sinus tenderness.  Mouth/Throat: Uvula is midline, oropharynx is clear and moist and mucous membranes are normal. No oropharyngeal exudate.  Eyes: Conjunctivae and EOM are normal. Pupils are equal, round, and reactive to light. Right eye exhibits no discharge. Left eye exhibits no discharge. No scleral icterus.  Neck: Normal range of motion. Neck supple. No thyromegaly present.  Cardiovascular: Normal rate, regular rhythm and normal heart sounds.   Pulmonary/Chest: Effort normal. No respiratory distress. She has wheezes (diffuse inspiratory and expiratory wheezes). She has no rales.  Lymphadenopathy:    She has no cervical adenopathy.  Skin: She is not diaphoretic.  Nursing note and vitals reviewed.    UC Treatments / Results  Labs (all labs ordered are listed, but only  abnormal results are displayed) Labs Reviewed - No data to display  EKG  EKG Interpretation None       Radiology No results found.  Procedures Procedures (including critical care time)  Medications Ordered in UC Medications  ipratropium-albuterol (DUONEB) 0.5-2.5 (3) MG/3ML nebulizer solution 3 mL (3 mLs Nebulization Given 04/24/16 1228)  ipratropium-albuterol (DUONEB) 0.5-2.5 (3) MG/3ML nebulizer solution 3 mL (3 mLs Nebulization Given  04/24/16 1245)     Initial Impression / Assessment and Plan / UC Course  I have reviewed the triage vital signs and the nursing notes.  Pertinent labs & imaging results that were available during my care of the patient were reviewed by me and considered in my medical decision making (see chart for details).  Clinical Course      Final Clinical Impressions(s) / UC Diagnoses   Final diagnoses:  Bronchitis  Cough  Smoker    New Prescriptions Discharge Medication List as of 04/24/2016  1:10 PM    START taking these medications   Details  albuterol (PROVENTIL HFA;VENTOLIN HFA) 108 (90 Base) MCG/ACT inhaler Inhale 2 puffs into the lungs every 6 (six) hours as needed for wheezing or shortness of breath., Starting Fri 04/24/2016, Normal    doxycycline (VIBRA-TABS) 100 MG tablet Take 1 tablet (100 mg total) by mouth 2 (two) times daily., Starting Fri 04/24/2016, Normal    predniSONE (DELTASONE) 20 MG tablet 1 tab po qd, Normal       1.diagnosis reviewed with patient 2. Patient given duoneb tx x 2 with significant improvement of symptoms rx as per orders above; reviewed possible side effects, interactions, risks and benefits  3. Recommend supportive treatment with increased fluids, rest, smoking cessation 4. Follow-up prn if symptoms worsen or don't improve   Norval Gable, MD 04/24/16 1459

## 2016-04-24 NOTE — ED Triage Notes (Signed)
Patient reported that shortness of breath started yesterday with cough, fever, wheezing, congestion.

## 2016-05-08 ENCOUNTER — Encounter: Payer: 59 | Attending: Physical Medicine and Rehabilitation | Admitting: Registered Nurse

## 2016-05-08 ENCOUNTER — Encounter: Payer: Self-pay | Admitting: Registered Nurse

## 2016-05-08 VITALS — BP 121/80 | HR 97

## 2016-05-08 DIAGNOSIS — M961 Postlaminectomy syndrome, not elsewhere classified: Secondary | ICD-10-CM

## 2016-05-08 DIAGNOSIS — Z9221 Personal history of antineoplastic chemotherapy: Secondary | ICD-10-CM | POA: Diagnosis not present

## 2016-05-08 DIAGNOSIS — T451X5S Adverse effect of antineoplastic and immunosuppressive drugs, sequela: Secondary | ICD-10-CM | POA: Insufficient documentation

## 2016-05-08 DIAGNOSIS — M5416 Radiculopathy, lumbar region: Secondary | ICD-10-CM | POA: Diagnosis not present

## 2016-05-08 DIAGNOSIS — G620 Drug-induced polyneuropathy: Secondary | ICD-10-CM | POA: Diagnosis not present

## 2016-05-08 DIAGNOSIS — G894 Chronic pain syndrome: Secondary | ICD-10-CM

## 2016-05-08 DIAGNOSIS — T451X5A Adverse effect of antineoplastic and immunosuppressive drugs, initial encounter: Secondary | ICD-10-CM

## 2016-05-08 DIAGNOSIS — Z5181 Encounter for therapeutic drug level monitoring: Secondary | ICD-10-CM

## 2016-05-08 DIAGNOSIS — M79604 Pain in right leg: Secondary | ICD-10-CM | POA: Insufficient documentation

## 2016-05-08 DIAGNOSIS — F17210 Nicotine dependence, cigarettes, uncomplicated: Secondary | ICD-10-CM | POA: Insufficient documentation

## 2016-05-08 DIAGNOSIS — M79605 Pain in left leg: Secondary | ICD-10-CM | POA: Diagnosis not present

## 2016-05-08 DIAGNOSIS — G8929 Other chronic pain: Secondary | ICD-10-CM | POA: Diagnosis not present

## 2016-05-08 DIAGNOSIS — M545 Low back pain: Secondary | ICD-10-CM | POA: Insufficient documentation

## 2016-05-08 DIAGNOSIS — Z760 Encounter for issue of repeat prescription: Secondary | ICD-10-CM | POA: Diagnosis not present

## 2016-05-08 DIAGNOSIS — Z79899 Other long term (current) drug therapy: Secondary | ICD-10-CM

## 2016-05-08 MED ORDER — HYDROCODONE-ACETAMINOPHEN 10-325 MG PO TABS: ORAL_TABLET | ORAL | 0 refills | Status: DC
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
## 2016-05-12 ENCOUNTER — Ambulatory Visit: Payer: 59 | Admitting: Physical Medicine & Rehabilitation

## 2016-05-13 ENCOUNTER — Other Ambulatory Visit: Payer: Self-pay | Admitting: Registered Nurse

## 2016-05-13 NOTE — Telephone Encounter (Signed)
Per Dr Letta Pate note in July, Pamelor was increased to 20 mg q hs so refilled with #60 disp of 10mg  capsules.

## 2016-05-13 NOTE — Progress Notes (Deleted)
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   INTERVAL HISTORY: Patient last evaluated in clinic in December 2014.  She returns to clinic today for routine followup and further evaluation.  She has some mild tenderness at her surgical site on her right breast, but otherwise feels well.  She currently feels well and is asymptomatic.  She has no neurologic complaints.  She denies any recent fevers. She has a good appetite and denies weight loss. She denies any chest pain or shortness of breath.  She has no nausea, vomiting, constipation, or diarrhea.  She has no urinary complaints.  Patient offers no further specific complaints today.  REVIEW OF SYSTEMS:   Review of Systems  Constitutional: Negative.  Negative for fever, malaise/fatigue and weight loss.  Respiratory: Negative.  Negative for shortness of breath.   Cardiovascular: Negative.  Negative for chest pain.  Gastrointestinal: Negative.  Negative for abdominal pain.  Musculoskeletal: Negative.   Neurological: Negative.  Negative for weakness.    As per HPI. Otherwise, a complete review of systems is negatve.  PAST MEDICAL HISTORY: Past Medical History:  Diagnosis Date  . BRCA negative   . Breast cancer ([place]) 2013   RT LUMPECTOMY  . Cancer ([place])   . Radiation 2013   BREAST CA  . Status post chemotherapy 2013   BREAST CA    PAST SURGICAL HISTORY: Past Surgical History:  Procedure Laterality Date  . BREAST SURGERY    . mastectomy Right    partial with lymph node dissection  . PORT A CATH REVISION    . PORT-A-CATH REMOVAL  11-07-15   Dr Bary Castilla  .  SPINE SURGERY  2008   Dr Joya Salm    FAMILY HISTORY Family History  Problem Relation Age of Onset  . Diabetes Mother   . Hypertension Mother   . Cancer Paternal Uncle   . Breast cancer Paternal Uncle     40'S  . Breast cancer Paternal Aunt     23'S       ADVANCED DIRECTIVES:    HEALTH MAINTENANCE: Social History  Substance Use Topics  . Smoking status: Current Every Day Smoker    Packs/day: 1.00    Types: Cigarettes  . Smokeless tobacco: Never Used  . Alcohol use Yes     Comment: socially     Colonoscopy:  PAP:  Bone density:  Lipid panel:  Allergies  Allergen Reactions  . Penicillins Anaphylaxis  . Sulfa Antibiotics Anaphylaxis  . Neurontin [Gabapentin] Other (See Comments)    lethargy  . Ondansetron     Other reaction(s): Other (See Comments) migraines  . Zofran [Ondansetron Hcl]     Current Outpatient Prescriptions  Medication Sig Dispense Refill  . albuterol (PROVENTIL HFA;VENTOLIN HFA) 108 (90 Base) MCG/ACT inhaler Inhale 2 puffs into the lungs every 6 (six) hours as needed for wheezing or shortness of breath. 1 Inhaler 0  . doxycycline (VIBRA-TABS) 100 MG tablet Take 1 tablet (100 mg total) by mouth 2 (two) times daily. 20 tablet 0  . HYDROcodone-acetaminophen (NORCO) 10-325 MG tablet TAKE 1 TABLET BY MOUTH EVERY 6 HOURS AS NEEDED FOR PAIN 120 tablet 0  . ibuprofen (  ADVIL,MOTRIN) 800 MG tablet Take 800 mg by mouth every 6 (six) hours as needed (pain).     . nortriptyline (PAMELOR) 10 MG capsule TAKE 2 CAPSULES (20 MG TOTAL) BY MOUTH AT BEDTIME. 60 capsule 3  . predniSONE (DELTASONE) 20 MG tablet 1 tab po qd 7 tablet 0  . pregabalin (LYRICA) 50 MG capsule Take 1 capsule (50 mg total) by mouth 2 (two) times daily. 60 capsule 2  . zolpidem (AMBIEN) 5 MG tablet TAKE 1 TABLET BY MOUTH AT BEDTIME AS NEEDED 30 tablet 2   No current facility-administered medications for this visit.     OBJECTIVE: There were no vitals filed for this visit.   There is no  height or weight on file to calculate BMI.    ECOG FS:0 - Asymptomatic  General: Well-developed, well-nourished, no acute distress. Eyes: Pink conjunctiva, anicteric sclera. HEENT: Normocephalic, moist mucous membranes, clear oropharnyx. Breasts: Bilateral breast and axilla without lumps or masses. Lungs: Clear to auscultation bilaterally. Heart: Regular rate and rhythm. No rubs, murmurs, or gallops. Abdomen: Soft, nontender, nondistended. No organomegaly noted, normoactive bowel sounds. Musculoskeletal: No edema, cyanosis, or clubbing. Neuro: Alert, answering all questions appropriately. Cranial nerves grossly intact. Skin: No rashes or petechiae noted. Psych: Normal affect. Lymphatics: No cervical, calvicular, axillary or inguinal LAD.   LAB RESULTS:  Lab Results  Component Value Date   NA 136 02/19/2015   K 3.8 02/19/2015   CL 102 02/19/2015   CO2 28 02/19/2015   GLUCOSE 100 (H) 02/19/2015   BUN 7 02/19/2015   CREATININE 0.72 02/19/2015   CALCIUM 9.4 02/19/2015   PROT 6.3 (L) 02/19/2015   ALBUMIN 3.9 02/19/2015   AST 27 02/19/2015   ALT 23 02/19/2015   ALKPHOS 59 02/19/2015   BILITOT 0.7 02/19/2015   GFRNONAA >60 02/19/2015   GFRAA >60 02/19/2015    Lab Results  Component Value Date   WBC 8.3 02/19/2015   NEUTROABS 4.5 02/19/2015   HGB 14.2 02/19/2015   HCT 41.3 02/19/2015   MCV 93.0 02/19/2015   PLT 233 02/19/2015     STUDIES: No results found.  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   PLAN:    1.  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  Patient completed chemotherapy with AC-Taxol in July 2013. She completed adjuvant XRT in October 2013. Patient has not had a mammogram in greater than 2 years therefore will schedule one in the next 1-2 weeks. CA-27-29 is pending from today.  She does not require tamoxifen given the ER/PR status of  her tumor. Return to clinic in 6 months for routine evaluation.  2.  Peripheral neuropathy: Resolved. 3.  Port: Patient has not had her port flushed since her last visit greater than 2 years ago. Will make referral to surgery for removal.  Patient expressed understanding and was in agreement with this plan. She also understands that She can call clinic at any time with any questions, concerns, or complaints.   Lloyd Huger, MD   05/13/2016 11:18 PM

## 2016-05-15 ENCOUNTER — Ambulatory Visit: Payer: 59 | Admitting: Oncology

## 2016-05-15 ENCOUNTER — Other Ambulatory Visit: Payer: 59

## 2016-05-18 ENCOUNTER — Telehealth: Payer: Self-pay | Admitting: Physical Medicine & Rehabilitation

## 2016-05-18 MED ORDER — ZOLPIDEM TARTRATE 5 MG PO TABS: 5.0000 mg | ORAL_TABLET | Freq: Every evening | ORAL | 2 refills | Status: DC | PRN

## 2016-05-18 NOTE — Telephone Encounter (Signed)
Patient is calling to let us know she is in need of her Ambien and the pharmacy is telling her it needs prior authorization.

## 2016-05-27 NOTE — Progress Notes (Deleted)
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   INTERVAL HISTORY: Patient last evaluated in clinic in December 2014.  She returns to clinic today for routine followup and further evaluation.  She has some mild tenderness at her surgical site on her right breast, but otherwise feels well.  She currently feels well and is asymptomatic.  She has no neurologic complaints.  She denies any recent fevers. She has a good appetite and denies weight loss. She denies any chest pain or shortness of breath.  She has no nausea, vomiting, constipation, or diarrhea.  She has no urinary complaints.  Patient offers no further specific complaints today.  REVIEW OF SYSTEMS:   Review of Systems  Constitutional: Negative.  Negative for fever, malaise/fatigue and weight loss.  Respiratory: Negative.  Negative for shortness of breath.   Cardiovascular: Negative.  Negative for chest pain.  Gastrointestinal: Negative.  Negative for abdominal pain.  Musculoskeletal: Negative.   Neurological: Negative.  Negative for weakness.    As per HPI. Otherwise, a complete review of systems is negatve.  PAST MEDICAL HISTORY: Past Medical History:  Diagnosis Date  . BRCA negative   . Breast cancer ([place]) 2013   RT LUMPECTOMY  . Cancer ([place])   . Radiation 2013   BREAST CA  . Status post chemotherapy 2013   BREAST CA    PAST SURGICAL HISTORY: Past Surgical History:  Procedure Laterality Date  . BREAST SURGERY    . mastectomy Right    partial with lymph node dissection  . PORT A CATH REVISION    . PORT-A-CATH REMOVAL  11-07-15   Dr Bary Castilla  .  SPINE SURGERY  2008   Dr Joya Salm    FAMILY HISTORY Family History  Problem Relation Age of Onset  . Diabetes Mother   . Hypertension Mother   . Cancer Paternal Uncle   . Breast cancer Paternal Uncle     40'S  . Breast cancer Paternal Aunt     65'S       ADVANCED DIRECTIVES:    HEALTH MAINTENANCE: Social History  Substance Use Topics  . Smoking status: Current Every Day Smoker    Packs/day: 1.00    Types: Cigarettes  . Smokeless tobacco: Never Used  . Alcohol use Yes     Comment: socially     Colonoscopy:  PAP:  Bone density:  Lipid panel:  Allergies  Allergen Reactions  . Penicillins Anaphylaxis  . Sulfa Antibiotics Anaphylaxis  . Neurontin [Gabapentin] Other (See Comments)    lethargy  . Ondansetron     Other reaction(s): Other (See Comments) migraines  . Zofran [Ondansetron Hcl]     Current Outpatient Prescriptions  Medication Sig Dispense Refill  . albuterol (PROVENTIL HFA;VENTOLIN HFA) 108 (90 Base) MCG/ACT inhaler Inhale 2 puffs into the lungs every 6 (six) hours as needed for wheezing or shortness of breath. 1 Inhaler 0  . doxycycline (VIBRA-TABS) 100 MG tablet Take 1 tablet (100 mg total) by mouth 2 (two) times daily. 20 tablet 0  . HYDROcodone-acetaminophen (NORCO) 10-325 MG tablet TAKE 1 TABLET BY MOUTH EVERY 6 HOURS AS NEEDED FOR PAIN 120 tablet 0  . ibuprofen (  ADVIL,MOTRIN) 800 MG tablet Take 800 mg by mouth every 6 (six) hours as needed (pain).     . nortriptyline (PAMELOR) 10 MG capsule TAKE 2 CAPSULES (20 MG TOTAL) BY MOUTH AT BEDTIME. 60 capsule 3  . predniSONE (DELTASONE) 20 MG tablet 1 tab po qd 7 tablet 0  . pregabalin (LYRICA) 50 MG capsule Take 1 capsule (50 mg total) by mouth 2 (two) times daily. 60 capsule 2  . zolpidem (AMBIEN) 5 MG tablet Take 1 tablet (5 mg total) by mouth at bedtime as needed. 30 tablet 2   No current facility-administered medications for this visit.     OBJECTIVE: There were no vitals filed for this visit.    There is no height or weight on file to calculate BMI.    ECOG FS:0 - Asymptomatic  General: Well-developed, well-nourished, no acute distress. Eyes: Pink conjunctiva, anicteric sclera. HEENT: Normocephalic, moist mucous membranes, clear oropharnyx. Breasts: Bilateral breast and axilla without lumps or masses. Lungs: Clear to auscultation bilaterally. Heart: Regular rate and rhythm. No rubs, murmurs, or gallops. Abdomen: Soft, nontender, nondistended. No organomegaly noted, normoactive bowel sounds. Musculoskeletal: No edema, cyanosis, or clubbing. Neuro: Alert, answering all questions appropriately. Cranial nerves grossly intact. Skin: No rashes or petechiae noted. Psych: Normal affect. Lymphatics: No cervical, calvicular, axillary or inguinal LAD.   LAB RESULTS:  Lab Results  Component Value Date   NA 136 02/19/2015   K 3.8 02/19/2015   CL 102 02/19/2015   CO2 28 02/19/2015   GLUCOSE 100 (H) 02/19/2015   BUN 7 02/19/2015   CREATININE 0.72 02/19/2015   CALCIUM 9.4 02/19/2015   PROT 6.3 (L) 02/19/2015   ALBUMIN 3.9 02/19/2015   AST 27 02/19/2015   ALT 23 02/19/2015   ALKPHOS 59 02/19/2015   BILITOT 0.7 02/19/2015   GFRNONAA >60 02/19/2015   GFRAA >60 02/19/2015    Lab Results  Component Value Date   WBC 8.3 02/19/2015   NEUTROABS 4.5 02/19/2015   HGB 14.2 02/19/2015   HCT 41.3 02/19/2015   MCV 93.0 02/19/2015   PLT 233 02/19/2015     STUDIES: No results found.  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   PLAN:    1.  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  Patient completed chemotherapy with AC-Taxol in July 2013. She completed adjuvant XRT in October 2013. Patient has not had a mammogram in greater than 2 years therefore will schedule one in the next 1-2 weeks. CA-27-29 is pending from today.  She does not require tamoxifen given the  ER/PR status of her tumor. Return to clinic in 6 months for routine evaluation.  2.  Peripheral neuropathy: Resolved. 3.  Port: Patient has not had her port flushed since her last visit greater than 2 years ago. Will make referral to surgery for removal.  Patient expressed understanding and was in agreement with this plan. She also understands that She can call clinic at any time with any questions, concerns, or complaints.   Lloyd Huger, MD   05/27/2016 11:30 PM

## 2016-05-29 ENCOUNTER — Inpatient Hospital Stay: Payer: 59 | Attending: Oncology

## 2016-05-29 ENCOUNTER — Inpatient Hospital Stay: Payer: 59 | Admitting: Oncology

## 2016-06-05 ENCOUNTER — Encounter: Payer: Self-pay | Admitting: Physical Medicine & Rehabilitation

## 2016-06-05 ENCOUNTER — Encounter: Payer: 59 | Attending: Physical Medicine and Rehabilitation

## 2016-06-05 ENCOUNTER — Ambulatory Visit: Payer: 59 | Admitting: Registered Nurse

## 2016-06-05 ENCOUNTER — Ambulatory Visit (HOSPITAL_BASED_OUTPATIENT_CLINIC_OR_DEPARTMENT_OTHER): Payer: 59 | Admitting: Physical Medicine & Rehabilitation

## 2016-06-05 ENCOUNTER — Encounter (INDEPENDENT_AMBULATORY_CARE_PROVIDER_SITE_OTHER): Payer: Self-pay

## 2016-06-05 VITALS — BP 125/87 | HR 92 | Resp 14

## 2016-06-05 DIAGNOSIS — M791 Myalgia: Secondary | ICD-10-CM

## 2016-06-05 DIAGNOSIS — G620 Drug-induced polyneuropathy: Secondary | ICD-10-CM | POA: Diagnosis not present

## 2016-06-05 DIAGNOSIS — M79604 Pain in right leg: Secondary | ICD-10-CM | POA: Insufficient documentation

## 2016-06-05 DIAGNOSIS — M79605 Pain in left leg: Secondary | ICD-10-CM | POA: Diagnosis not present

## 2016-06-05 DIAGNOSIS — F17210 Nicotine dependence, cigarettes, uncomplicated: Secondary | ICD-10-CM | POA: Insufficient documentation

## 2016-06-05 DIAGNOSIS — M545 Low back pain: Secondary | ICD-10-CM | POA: Insufficient documentation

## 2016-06-05 DIAGNOSIS — Z9221 Personal history of antineoplastic chemotherapy: Secondary | ICD-10-CM | POA: Insufficient documentation

## 2016-06-05 DIAGNOSIS — T451X5S Adverse effect of antineoplastic and immunosuppressive drugs, sequela: Secondary | ICD-10-CM | POA: Insufficient documentation

## 2016-06-05 DIAGNOSIS — M961 Postlaminectomy syndrome, not elsewhere classified: Secondary | ICD-10-CM

## 2016-06-05 DIAGNOSIS — T451X5A Adverse effect of antineoplastic and immunosuppressive drugs, initial encounter: Secondary | ICD-10-CM | POA: Diagnosis not present

## 2016-06-05 DIAGNOSIS — Z760 Encounter for issue of repeat prescription: Secondary | ICD-10-CM | POA: Insufficient documentation

## 2016-06-05 DIAGNOSIS — G8929 Other chronic pain: Secondary | ICD-10-CM | POA: Diagnosis not present

## 2016-06-05 DIAGNOSIS — M7918 Myalgia, other site: Secondary | ICD-10-CM

## 2016-06-05 MED ORDER — HYDROCODONE-ACETAMINOPHEN 10-325 MG PO TABS
ORAL_TABLET | ORAL | 0 refills | Status: DC
Start: 2016-06-05 — End: 2016-07-08

## 2016-06-05 NOTE — Progress Notes (Signed)
 Subjective:    Patient ID: Elizabeth Torres, female    DOB: 09/27/1975, 40 y.o.   MRN: [id number]  HPI Working at warehouse ~60hrs per week. Right trapezius trigger point injections were helpful in July, lasted a couple months. The increase in nortriptyline was helpful for foot pain. Starting to develop some pain in the left shoulder area. Sits at a computer most of the day. No falls or trauma. No pain radiating into the arms. Pain Inventory Average Pain 4 Pain Right Now 3 My pain is constant, dull and aching  In the last 24 hours, has pain interfered with the following? General activity 3 Relation with others 3 Enjoyment of life 2 What TIME of day is your pain at its worst? evening Sleep (in general) Fair  Pain is worse with: sitting, inactivity and standing Pain improves with: rest and medication Relief from Meds: 5  Mobility Do you have any goals in this area?  no  Function Do you have any goals in this area?  no  Neuro/Psych No problems in this area  Prior Studies Any changes since last visit?  no  Physicians involved in your care Any changes since last visit?  no   Family History  Problem Relation Age of Onset  . Diabetes Mother   . Hypertension Mother   . Cancer Paternal Uncle   . Breast cancer Paternal Uncle     40'S  . Breast cancer Paternal Aunt     60'S   Social History   Social History  . Marital status: Married    Spouse name: N/A  . Number of children: N/A  . Years of education: N/A   Social History Main Topics  . Smoking status: Current Every Day Smoker    Packs/day: 1.00    Types: Cigarettes  . Smokeless tobacco: Never Used  . Alcohol use Yes     Comment: socially  . Drug use: No  . Sexual activity: Not Asked   Other Topics Concern  . None   Social History Narrative  . None   Past Surgical History:  Procedure Laterality Date  . BREAST SURGERY    . mastectomy Right    partial with lymph node dissection  . PORT A CATH  REVISION    . PORT-A-CATH REMOVAL  11-07-15   Dr Byrnett  . SPINE SURGERY  2008   Dr Botero   Past Medical History:  Diagnosis Date  . BRCA negative   . Breast cancer ([facility]) 2013   RT LUMPECTOMY  . Cancer ([facility])   . Radiation 2013   BREAST CA  . Status post chemotherapy 2013   BREAST CA   BP 125/87 (BP Location: Left Arm, Patient Position: Sitting, Cuff Size: Large)   Pulse 92   Resp 14   SpO2 93%   Opioid Risk Score:   Fall Risk Score:  `1  Depression screen PHQ 2/9  Depression screen PHQ 2/9 03/13/2016 10/10/2015 08/15/2015 04/17/2015 02/19/2015 11/14/2014  Decreased Interest 0 0 0 0 0 0  Down, Depressed, Hopeless 0 0 0 0 0 0  PHQ - 2 Score 0 0 0 0 0 0  Altered sleeping - 2 - - 1 1  Tired, decreased energy - 1 - - 1 1  Change in appetite - 0 - - 0 0  Feeling bad or failure about yourself  - 0 - - 0 0  Trouble concentrating - 0 - - 0 0  Moving slowly or fidgety/restless - 0 - -   0 0  Suicidal thoughts - - - - 0 0  PHQ-9 Score - 3 - - 2 2  Difficult doing work/chores - Not difficult at all - - - -    Review of Systems  Constitutional: Negative.   HENT: Negative.   Respiratory: Negative.   Cardiovascular: Negative.   Gastrointestinal: Negative.   Endocrine: Negative.   Genitourinary: Negative.   Musculoskeletal: Positive for arthralgias, back pain, myalgias and neck pain.  Skin: Negative.   Neurological: Negative.   Psychiatric/Behavioral: Negative.        Objective:   Physical Exam  Constitutional: She is oriented to person, place, and time. She appears well-developed and well-nourished.  HENT:  Head: Normocephalic and atraumatic.  Eyes: Conjunctivae and EOM are normal. Pupils are equal, round, and reactive to light.  Neck: Normal range of motion.  Pulmonary/Chest: Effort normal. No respiratory distress.  Neurological: She is alert and oriented to person, place, and time.  Psychiatric: She has a normal mood and affect.  Nursing note and vitals  reviewed.  Tenderness palpation bilateral upper trapezius. Upper extremity strength 5/5 in the deltoid, biceps, triceps, grip. Lower extremity strength is 5/5 hip flexor, knee extensor ankles. Lumbar spine has 75%, lateral bending percent for flexion percent, extension Negative straight leg raising.        Assessment & Plan:  1. Lumbar postlaminectomy syndrome, chronic low back pain Hydrocodone 10/325 4 times a day. Continue opioid monitoring program. This consists of regular clinic visits, examinations, urine drug screen, pill counts as well as use of [place] controlled substance reporting System.UDS 04/10/16 consistent  2. Cervical myofascial pain. Trigger point injections today Trigger Point Injection, Discussed shoulder roll exercises  Indication: Cervical Myofascial pain not relieved by medication management and other conservative care.  Informed consent was obtained after describing risk and benefits of the procedure with the patient, this includes bleeding, bruising, infection and medication side effects.  The patient wishes to proceed and has given written consent.  The patient was placed in a seated position.  The Trap area was marked and prepped with Betadine.  It was entered with a 25-gauge 1-1/2 inch needle and 1 mL of 1% lidocaine was injected into each of 4 trigger points, after negative draw back for blood.  The patient tolerated the procedure well.  Post procedure instructions were given.  3. 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

## 2016-06-05 NOTE — Patient Instructions (Signed)
Please perform shoulder rolls 20 repetitions forward and 20 repetitions backwards every hour when awake

## 2016-06-10 ENCOUNTER — Other Ambulatory Visit: Payer: Self-pay | Admitting: *Deleted

## 2016-06-10 MED ORDER — NORTRIPTYLINE HCL 10 MG PO CAPS: 20.0000 mg | ORAL_CAPSULE | Freq: Every day | ORAL | 0 refills | Status: DC

## 2016-07-06 ENCOUNTER — Encounter: Payer: 59 | Admitting: Registered Nurse

## 2016-07-08 ENCOUNTER — Encounter: Payer: 59 | Attending: Physical Medicine and Rehabilitation | Admitting: Registered Nurse

## 2016-07-08 ENCOUNTER — Encounter: Payer: Self-pay | Admitting: Registered Nurse

## 2016-07-08 VITALS — BP 125/84 | HR 84

## 2016-07-08 DIAGNOSIS — Z760 Encounter for issue of repeat prescription: Secondary | ICD-10-CM | POA: Diagnosis not present

## 2016-07-08 DIAGNOSIS — M545 Low back pain: Secondary | ICD-10-CM | POA: Insufficient documentation

## 2016-07-08 DIAGNOSIS — M79604 Pain in right leg: Secondary | ICD-10-CM | POA: Diagnosis not present

## 2016-07-08 DIAGNOSIS — T451X5S Adverse effect of antineoplastic and immunosuppressive drugs, sequela: Secondary | ICD-10-CM | POA: Diagnosis not present

## 2016-07-08 DIAGNOSIS — F17210 Nicotine dependence, cigarettes, uncomplicated: Secondary | ICD-10-CM | POA: Insufficient documentation

## 2016-07-08 DIAGNOSIS — Z9221 Personal history of antineoplastic chemotherapy: Secondary | ICD-10-CM | POA: Diagnosis not present

## 2016-07-08 DIAGNOSIS — M79605 Pain in left leg: Secondary | ICD-10-CM | POA: Diagnosis not present

## 2016-07-08 DIAGNOSIS — G620 Drug-induced polyneuropathy: Secondary | ICD-10-CM | POA: Diagnosis not present

## 2016-07-08 DIAGNOSIS — G894 Chronic pain syndrome: Secondary | ICD-10-CM

## 2016-07-08 DIAGNOSIS — M961 Postlaminectomy syndrome, not elsewhere classified: Secondary | ICD-10-CM

## 2016-07-08 DIAGNOSIS — T451X5A Adverse effect of antineoplastic and immunosuppressive drugs, initial encounter: Secondary | ICD-10-CM

## 2016-07-08 DIAGNOSIS — G8929 Other chronic pain: Secondary | ICD-10-CM | POA: Diagnosis not present

## 2016-07-08 DIAGNOSIS — Z79899 Other long term (current) drug therapy: Secondary | ICD-10-CM

## 2016-07-08 DIAGNOSIS — M5416 Radiculopathy, lumbar region: Secondary | ICD-10-CM

## 2016-07-08 DIAGNOSIS — Z5181 Encounter for therapeutic drug level monitoring: Secondary | ICD-10-CM

## 2016-07-08 MED ORDER — HYDROCODONE-ACETAMINOPHEN 10-325 MG PO TABS: ORAL_TABLET | ORAL | 0 refills | Status: DC

## 2016-07-08 NOTE — Progress Notes (Signed)
Subjective:    Patient ID: Elizabeth Torres, female    DOB: 1975/08/24, 40 y.o.   MRN: [id number]  HPI: Elizabeth Torres is a 40 year old female who returns for follow up for chronic pain and medication refill. She states her pain is located in her  lower back radiating into her bilateral lower extremities posteriorly. She rates her pain 4. Her current exercise regime is walking.   She's working 40 hours a week.  Pain Inventory Average Pain 4 Pain Right Now 4 My pain is constant and aching  In the last 24 hours, has pain interfered with the following? General activity 4 Relation with others 3 Enjoyment of life 3 What TIME of day is your pain at its worst? evening Sleep (in general) Fair  Pain is worse with: sitting, inactivity and standing Pain improves with: rest and medication Relief from Meds: 4  Mobility walk without assistance Do you have any goals in this area?  no  Function employed # of hrs/week 40  Neuro/Psych No problems in this area  Prior Studies Any changes since last visit?  no  Physicians involved in your care Any changes since last visit?  no   Family History  Problem Relation Age of Onset  . Diabetes Mother   . Hypertension Mother   . Cancer Paternal Uncle   . Breast cancer Paternal Uncle     40'S  . Breast cancer Paternal Aunt     49'S   Social History   Social History  . Marital status: Married    Spouse name: N/A  . Number of children: N/A  . Years of education: N/A   Social History Main Topics  . Smoking status: Current Every Day Smoker    Packs/day: 1.00    Types: Cigarettes  . Smokeless tobacco: Never Used  . Alcohol use Yes     Comment: socially  . Drug use: No  . Sexual activity: Not on file   Other Topics Concern  . Not on file   Social History Narrative  . No narrative on file   Past Surgical History:  Procedure Laterality Date  . BREAST SURGERY    . mastectomy Right    partial with lymph node dissection  .  PORT A CATH REVISION    . PORT-A-CATH REMOVAL  11-07-15   Dr Bary Castilla  . SPINE SURGERY  2008   Dr Joya Salm   Past Medical History:  Diagnosis Date  . BRCA negative   . Breast cancer ([place]) 2013   RT LUMPECTOMY  . Cancer ([place])   . Radiation 2013   BREAST CA  . Status post chemotherapy 2013   BREAST CA   There were no vitals taken for this visit.  Opioid Risk Score:   Fall Risk Score:  `1  Depression screen PHQ 2/9  Depression screen [facility] 2/9 03/13/2016 10/10/2015 08/15/2015 04/17/2015 02/19/2015 11/14/2014  Decreased Interest 0 0 0 0 0 0  Down, Depressed, Hopeless 0 0 0 0 0 0  PHQ - 2 Score 0 0 0 0 0 0  Altered sleeping - 2 - - 1 1  Tired, decreased energy - 1 - - 1 1  Change in appetite - 0 - - 0 0  Feeling bad or failure about yourself  - 0 - - 0 0  Trouble concentrating - 0 - - 0 0  Moving slowly or fidgety/restless - 0 - - 0 0  Suicidal thoughts - - - - 0  0  PHQ-9 Score - 3 - - 2 2  Difficult doing work/chores - Not difficult at all - - - -   Review of Systems  Constitutional: Negative.   HENT: Negative.   Eyes: Negative.   Respiratory: Negative.   Cardiovascular: Negative.   Gastrointestinal: Negative.   Endocrine: Negative.   Genitourinary: Negative.   Musculoskeletal: Positive for back pain.  Skin: Negative.   Allergic/Immunologic: Negative.   Neurological: Negative.   Hematological: Negative.   Psychiatric/Behavioral: Negative.   All other systems reviewed and are negative.      Objective:   Physical Exam  Constitutional: She is oriented to person, place, and time. She appears well-developed and well-nourished.  HENT:  Head: Normocephalic and atraumatic.  Neck: Normal range of motion. Neck supple.  Cardiovascular: Normal rate and regular rhythm.   Pulmonary/Chest: Effort normal and breath sounds normal.  Musculoskeletal:  Normal Muscle Bulk and Muscle Testing Reveals:  Upper Extremities: Full ROM and Muscle Strength 5/5 Lumbar Paraspinal Tenderness:  L-4-L-5 Lower Extremities: Full ROM and Muscle Strength 5/5 Arises From Table with ease Narrow Based Gait    Neurological: She is alert and oriented to person, place, and time.  Skin: Skin is warm and dry.  Psychiatric: She has a normal mood and affect.  Nursing note and vitals reviewed.         Assessment & Plan:  1. Lumbar postlaminectomy syndrome status post L5-S1 fusion with chronic S1 radiculopathy  Continue: Hydrocodone 10/325 mg #120--one every 6 hours as needed for pain. We will continue the opioid monitoring program, this consists of regular clinic visits, examinations, urine drug screen, pill counts as well as use of [place] Controlled Substance reporting System. 2. 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 Insomnia: Continue Ambien  15 minutes of face to face time was spent during this visit. All questions were encouraged and answered.   F/U in 56month

## 2016-08-05 ENCOUNTER — Encounter: Payer: Self-pay | Admitting: Registered Nurse

## 2016-08-05 ENCOUNTER — Encounter: Payer: 59 | Attending: Physical Medicine and Rehabilitation | Admitting: Registered Nurse

## 2016-08-05 VITALS — BP 122/85 | HR 88 | Resp 14

## 2016-08-05 DIAGNOSIS — M545 Low back pain: Secondary | ICD-10-CM | POA: Diagnosis not present

## 2016-08-05 DIAGNOSIS — Z5181 Encounter for therapeutic drug level monitoring: Secondary | ICD-10-CM

## 2016-08-05 DIAGNOSIS — Z79899 Other long term (current) drug therapy: Secondary | ICD-10-CM

## 2016-08-05 DIAGNOSIS — G620 Drug-induced polyneuropathy: Secondary | ICD-10-CM | POA: Insufficient documentation

## 2016-08-05 DIAGNOSIS — G894 Chronic pain syndrome: Secondary | ICD-10-CM

## 2016-08-05 DIAGNOSIS — Z9221 Personal history of antineoplastic chemotherapy: Secondary | ICD-10-CM | POA: Insufficient documentation

## 2016-08-05 DIAGNOSIS — F17210 Nicotine dependence, cigarettes, uncomplicated: Secondary | ICD-10-CM | POA: Insufficient documentation

## 2016-08-05 DIAGNOSIS — M79605 Pain in left leg: Secondary | ICD-10-CM | POA: Diagnosis not present

## 2016-08-05 DIAGNOSIS — G8929 Other chronic pain: Secondary | ICD-10-CM | POA: Diagnosis not present

## 2016-08-05 DIAGNOSIS — Z760 Encounter for issue of repeat prescription: Secondary | ICD-10-CM | POA: Insufficient documentation

## 2016-08-05 DIAGNOSIS — M961 Postlaminectomy syndrome, not elsewhere classified: Secondary | ICD-10-CM | POA: Insufficient documentation

## 2016-08-05 DIAGNOSIS — M79604 Pain in right leg: Secondary | ICD-10-CM | POA: Diagnosis not present

## 2016-08-05 DIAGNOSIS — M5416 Radiculopathy, lumbar region: Secondary | ICD-10-CM | POA: Diagnosis not present

## 2016-08-05 DIAGNOSIS — T451X5A Adverse effect of antineoplastic and immunosuppressive drugs, initial encounter: Secondary | ICD-10-CM

## 2016-08-05 DIAGNOSIS — T451X5S Adverse effect of antineoplastic and immunosuppressive drugs, sequela: Secondary | ICD-10-CM | POA: Insufficient documentation

## 2016-08-05 MED ORDER — HYDROCODONE-ACETAMINOPHEN 10-325 MG PO TABS: ORAL_TABLET | ORAL | 0 refills | Status: DC

## 2016-08-05 NOTE — Progress Notes (Signed)
Subjective:    Patient ID: Elizabeth Torres, female    DOB: 1976-04-16, 41 y.o.   MRN: [id number]  HPI: [name] is a 41year old female who returns for follow up appointment for chronic pain and medication refill. She states her pain is located in her lower back radiating into her bilateral lower extremities posteriorly.  Also stated "occasionally when she stands from a sitting position she hears a popping sound in her lower back and feels her bones are rubbing together". I will speak with Dr. Letta Pate regarding the above and give her a call. She rates her pain 4. Her current exercise regime is walking.   She's working 40 hours a week.  Pain Inventory Average Pain 4 Pain Right Now 4 My pain is constant, dull and aching  In the last 24 hours, has pain interfered with the following? General activity 4 Relation with others 3 Enjoyment of life 3 What TIME of day is your pain at its worst? evening Sleep (in general) Fair  Pain is worse with: sitting, inactivity and standing Pain improves with: rest and medication Relief from Meds: 4  Mobility Do you have any goals in this area?  no  Function Do you have any goals in this area?  no  Neuro/Psych No problems in this area  Prior Studies Any changes since last visit?  no  Physicians involved in your care Any changes since last visit?  no   Family History  Problem Relation Age of Onset  . Diabetes Mother   . Hypertension Mother   . Cancer Paternal Uncle   . Breast cancer Paternal Uncle     40'S  . Breast cancer Paternal Aunt     68'S   Social History   Social History  . Marital status: Married    Spouse name: N/A  . Number of children: N/A  . Years of education: N/A   Social History Main Topics  . Smoking status: Current Every Day Smoker    Packs/day: 1.00    Types: Cigarettes  . Smokeless tobacco: Never Used  . Alcohol use Yes     Comment: socially  . Drug use: No  . Sexual activity: Not Asked    Other Topics Concern  . None   Social History Narrative  . None   Past Surgical History:  Procedure Laterality Date  . BREAST SURGERY    . mastectomy Right    partial with lymph node dissection  . PORT A CATH REVISION    . PORT-A-CATH REMOVAL  11-07-15   Dr Bary Castilla  . SPINE SURGERY  2008   Dr Joya Salm   Past Medical History:  Diagnosis Date  . BRCA negative   . Breast cancer ([place]) 2013   RT LUMPECTOMY  . Cancer ([place])   . Radiation 2013   BREAST CA  . Status post chemotherapy 2013   BREAST CA   BP 122/85   Pulse 88   Resp 14   SpO2 98%   Opioid Risk Score:   Fall Risk Score:  `1  Depression screen PHQ 2/9  Depression screen [facility] 2/9 03/13/2016 10/10/2015 08/15/2015 04/17/2015 02/19/2015 11/14/2014  Decreased Interest 0 0 0 0 0 0  Down, Depressed, Hopeless 0 0 0 0 0 0  PHQ - 2 Score 0 0 0 0 0 0  Altered sleeping - 2 - - 1 1  Tired, decreased energy - 1 - - 1 1  Change in appetite - 0 - -  0 0  Feeling bad or failure about yourself  - 0 - - 0 0  Trouble concentrating - 0 - - 0 0  Moving slowly or fidgety/restless - 0 - - 0 0  Suicidal thoughts - - - - 0 0  PHQ-9 Score - 3 - - 2 2  Difficult doing work/chores - Not difficult at all - - - -    Review of Systems  Constitutional: Negative.   HENT: Negative.   Eyes: Negative.   Respiratory: Negative.   Cardiovascular: Negative.   Gastrointestinal: Negative.   Endocrine: Negative.   Genitourinary: Negative.   Musculoskeletal: Negative.   Skin: Negative.   Allergic/Immunologic: Negative.   Neurological: Negative.   Hematological: Negative.   Psychiatric/Behavioral: Negative.   All other systems reviewed and are negative.      Objective:   Physical Exam  Constitutional: She is oriented to person, place, and time. She appears well-developed and well-nourished.  HENT:  Head: Normocephalic and atraumatic.  Neck: Normal range of motion. Neck supple.  Cardiovascular: Normal rate and regular rhythm.    Pulmonary/Chest: Effort normal and breath sounds normal.  Musculoskeletal:  Normal Muscle Bulk and Muscle Testing Reveals: Upper Extremities: Full ROM and Muscle Strength 5/5 Back without spinal Tenderness Noted Lower Extremities: Full ROM and Muscle Strength 5/5 Arises from Chair with ease Narrow Based Gait  Neurological: She is alert and oriented to person, place, and time.  Skin: Skin is warm and dry.  Psychiatric: She has a normal mood and affect.  Nursing note and vitals reviewed.         Assessment & Plan:  1. Lumbar postlaminectomy syndrome status post L5-S1 fusion with chronic S1 radiculopathy  Continue: Hydrocodone 10/325 mg #120--one every 6 hours as needed for pain. We will continue the opioid monitoring program, this consists of regular clinic visits, examinations, urine drug screen, pill counts as well as use of [place] Controlled Substance reporting System. 2. 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 Insomnia: Continue Ambien  15 minutes of face to face time was spent during this visit. All questions were encouraged and answered.   F/U in 67month

## 2016-08-05 NOTE — Addendum Note (Signed)
Addended by: Elinor Parkinson I on: 08/05/2016 02:06 PM   Modules accepted: Orders

## 2016-08-07 ENCOUNTER — Telehealth: Payer: Self-pay | Admitting: Registered Nurse

## 2016-08-07 NOTE — Telephone Encounter (Signed)
Left detailed message on voicemail per DPR , and asked for her to call us back and let us know what she wants to do.

## 2016-08-07 NOTE — Telephone Encounter (Signed)
Placed a call to Ms. Giambra regarding her questions, spoke with Dr. Letta Pate. We can obtain the Lumbar X-ray, we can schedule for MBB with Dr. Letta Pate. Awaiting her call back to discuss the above.

## 2016-08-07 NOTE — Telephone Encounter (Signed)
She is returnig your call please call back at [phone]

## 2016-08-11 LAB — TOXASSURE SELECT,+ANTIDEPR,UR

## 2016-08-12 ENCOUNTER — Other Ambulatory Visit: Payer: Self-pay

## 2016-08-12 MED ORDER — ZOLPIDEM TARTRATE 5 MG PO TABS: 5.0000 mg | ORAL_TABLET | Freq: Every evening | ORAL | 2 refills | Status: DC | PRN

## 2016-08-12 NOTE — Progress Notes (Signed)
Urine drug screen for this encounter is consistent for prescribed medication 

## 2016-08-20 NOTE — Telephone Encounter (Signed)
On January 18,2018 NCCSR was reviewed: No conflict was seen on the [place] with Multiple Prescribers. Ms. Prudhomme has signed a Narcotic Contract with our office. If there were any discrepancies this will be reported to her  Physcian.

## 2016-09-03 ENCOUNTER — Encounter: Payer: 59 | Attending: Physical Medicine and Rehabilitation | Admitting: Registered Nurse

## 2016-09-03 ENCOUNTER — Encounter: Payer: Self-pay | Admitting: Registered Nurse

## 2016-09-03 VITALS — BP 127/82 | HR 96 | Resp 14

## 2016-09-03 DIAGNOSIS — Z5181 Encounter for therapeutic drug level monitoring: Secondary | ICD-10-CM

## 2016-09-03 DIAGNOSIS — T451X5S Adverse effect of antineoplastic and immunosuppressive drugs, sequela: Secondary | ICD-10-CM | POA: Insufficient documentation

## 2016-09-03 DIAGNOSIS — M79604 Pain in right leg: Secondary | ICD-10-CM | POA: Diagnosis not present

## 2016-09-03 DIAGNOSIS — T451X5A Adverse effect of antineoplastic and immunosuppressive drugs, initial encounter: Secondary | ICD-10-CM

## 2016-09-03 DIAGNOSIS — G8929 Other chronic pain: Secondary | ICD-10-CM | POA: Insufficient documentation

## 2016-09-03 DIAGNOSIS — M961 Postlaminectomy syndrome, not elsewhere classified: Secondary | ICD-10-CM | POA: Insufficient documentation

## 2016-09-03 DIAGNOSIS — F17210 Nicotine dependence, cigarettes, uncomplicated: Secondary | ICD-10-CM | POA: Diagnosis not present

## 2016-09-03 DIAGNOSIS — M5416 Radiculopathy, lumbar region: Secondary | ICD-10-CM | POA: Diagnosis not present

## 2016-09-03 DIAGNOSIS — G620 Drug-induced polyneuropathy: Secondary | ICD-10-CM

## 2016-09-03 DIAGNOSIS — Z9221 Personal history of antineoplastic chemotherapy: Secondary | ICD-10-CM | POA: Insufficient documentation

## 2016-09-03 DIAGNOSIS — Z760 Encounter for issue of repeat prescription: Secondary | ICD-10-CM | POA: Diagnosis not present

## 2016-09-03 DIAGNOSIS — Z79899 Other long term (current) drug therapy: Secondary | ICD-10-CM

## 2016-09-03 DIAGNOSIS — M79605 Pain in left leg: Secondary | ICD-10-CM | POA: Diagnosis not present

## 2016-09-03 DIAGNOSIS — M545 Low back pain: Secondary | ICD-10-CM | POA: Diagnosis not present

## 2016-09-03 DIAGNOSIS — G894 Chronic pain syndrome: Secondary | ICD-10-CM

## 2016-09-03 MED ORDER — HYDROCODONE-ACETAMINOPHEN 10-325 MG PO TABS: ORAL_TABLET | ORAL | 0 refills | Status: DC

## 2016-09-03 NOTE — Progress Notes (Signed)
Subjective:    Patient ID: Elizabeth Torres, female    DOB: 12-May-1976, 41 y.o.   MRN: [id number]  HPI:  Elizabeth Torres is a 41year old female who returns for follow up appointment for chronic pain and medication refill. She states her pain is located in her lower back radiating into her bilaterallower extremities posteriorly.  Also stated she has noticed bruising on her bilateral ankles when she works 12 hours, no ecchymosis noted today, also states she will follow up with her oncologist. She rates her pain 3. Her current exercise regime is walking.   She's working 40 hours a week.  Pain Inventory Average Pain 4 Pain Right Now 3 My pain is constant, dull and aching  In the last 24 hours, has pain interfered with the following? General activity 3 Relation with others 3 Enjoyment of life 2 What TIME of day is your pain at its worst? evening Sleep (in general) Fair  Pain is worse with: sitting, inactivity and standing Pain improves with: rest and medication Relief from Meds: 5  Mobility Do you have any goals in this area?  no  Function Do you have any goals in this area?  no  Neuro/Psych No problems in this area  Prior Studies Any changes since last visit?  no  Physicians involved in your care Any changes since last visit?  no   Family History  Problem Relation Age of Onset  . Diabetes Mother   . Hypertension Mother   . Cancer Paternal Uncle   . Breast cancer Paternal Uncle     40'S  . Breast cancer Paternal Aunt     80'S   Social History   Social History  . Marital status: Married    Spouse name: N/A  . Number of children: N/A  . Years of education: N/A   Social History Main Topics  . Smoking status: Current Every Day Smoker    Packs/day: 1.00    Types: Cigarettes  . Smokeless tobacco: Never Used  . Alcohol use Yes     Comment: socially  . Drug use: No  . Sexual activity: Not Asked   Other Topics Concern  . None   Social History Narrative    . None   Past Surgical History:  Procedure Laterality Date  . BREAST SURGERY    . mastectomy Right    partial with lymph node dissection  . PORT A CATH REVISION    . PORT-A-CATH REMOVAL  11-07-15   Dr Bary Castilla  . SPINE SURGERY  2008   Dr Joya Salm   Past Medical History:  Diagnosis Date  . BRCA negative   . Breast cancer ([place]) 2013   RT LUMPECTOMY  . Cancer ([place])   . Radiation 2013   BREAST CA  . Status post chemotherapy 2013   BREAST CA   BP 127/82   Pulse 96   Resp 14   SpO2 98%   Opioid Risk Score:   Fall Risk Score:  `1  Depression screen PHQ 2/9  Depression screen [facility] 2/9 03/13/2016 10/10/2015 08/15/2015 04/17/2015 02/19/2015 11/14/2014  Decreased Interest 0 0 0 0 0 0  Down, Depressed, Hopeless 0 0 0 0 0 0  PHQ - 2 Score 0 0 0 0 0 0  Altered sleeping - 2 - - 1 1  Tired, decreased energy - 1 - - 1 1  Change in appetite - 0 - - 0 0  Feeling bad or failure about yourself  -  0 - - 0 0  Trouble concentrating - 0 - - 0 0  Moving slowly or fidgety/restless - 0 - - 0 0  Suicidal thoughts - - - - 0 0  PHQ-9 Score - 3 - - 2 2  Difficult doing work/chores - Not difficult at all - - - -      Review of Systems  Constitutional: Negative.   HENT: Negative.   Eyes: Negative.   Respiratory: Negative.   Cardiovascular: Negative.   Gastrointestinal: Negative.   Endocrine: Negative.   Genitourinary: Negative.   Musculoskeletal: Negative.   Skin: Negative.   Allergic/Immunologic: Negative.   Neurological: Negative.   Hematological: Negative.   Psychiatric/Behavioral: Negative.   All other systems reviewed and are negative.      Objective:   Physical Exam  Constitutional: She is oriented to person, place, and time. She appears well-developed and well-nourished.  HENT:  Head: Normocephalic and atraumatic.  Neck: Normal range of motion. Neck supple.  Cardiovascular: Normal rate and regular rhythm.   Pulmonary/Chest: Effort normal and breath sounds normal.   Musculoskeletal:  Normal Muscle Bulk and Muscle Testing Reveals: Upper Extremities: Full ROM and Muscle Strength 5/5 Back without spinal tenderness noted Lower Extremities Full ROM and Muscle Strength 5/5 Arises from chair with ease Narrow Based Gait  Neurological: She is alert and oriented to person, place, and time.  Skin: Skin is warm and dry.  Psychiatric: She has a normal mood and affect.  Nursing note and vitals reviewed.         Assessment & Plan:  1. Lumbar postlaminectomy syndrome status post L5-S1 fusion with chronic S1 radiculopathy  Continue: Hydrocodone 10/325 mg #120--one every 6 hours as needed for pain. We will continue the opioid monitoring program, this consists of regular clinic visits, examinations, urine drug screen, pill counts as well as use of [place] Controlled Substance reporting System. 2. 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 Insomnia: Continue Ambien  15 minutes of face to face time was spent during this visit. All questions were encouraged and answered.   F/U in 53month

## 2016-09-17 ENCOUNTER — Other Ambulatory Visit: Payer: Self-pay

## 2016-09-17 MED ORDER — NORTRIPTYLINE HCL 10 MG PO CAPS: 20.0000 mg | ORAL_CAPSULE | Freq: Every day | ORAL | 0 refills | Status: DC

## 2016-09-29 ENCOUNTER — Telehealth: Payer: Self-pay | Admitting: *Deleted

## 2016-09-29 ENCOUNTER — Telehealth: Payer: Self-pay

## 2016-09-29 NOTE — Telephone Encounter (Signed)
Called pt regarding msg left about needing antibiotic. Explained to her that she has not had a OV with Dr. Ronnald Ramp since 2015. She needs to be seen before a antibiotic to get prescribed. Told her I can transer her up front to make an appt. She stated "I don't know when I'll have time. I'll be fine."

## 2016-09-29 NOTE — Telephone Encounter (Signed)
Elizabeth Torres called to request that Elizabeth Torres write her a prescription for a z pak for her sinus infection. I have called Elizabeth Torres back and left her a message that she needs to see her PCP or urgent care about a prescription for antibiotics.

## 2016-10-02 ENCOUNTER — Encounter: Payer: 59 | Attending: Physical Medicine and Rehabilitation | Admitting: Registered Nurse

## 2016-10-02 ENCOUNTER — Encounter: Payer: Self-pay | Admitting: Registered Nurse

## 2016-10-02 VITALS — BP 118/79 | HR 91

## 2016-10-02 DIAGNOSIS — Z79899 Other long term (current) drug therapy: Secondary | ICD-10-CM | POA: Diagnosis not present

## 2016-10-02 DIAGNOSIS — M5416 Radiculopathy, lumbar region: Secondary | ICD-10-CM

## 2016-10-02 DIAGNOSIS — Z9221 Personal history of antineoplastic chemotherapy: Secondary | ICD-10-CM | POA: Insufficient documentation

## 2016-10-02 DIAGNOSIS — M79604 Pain in right leg: Secondary | ICD-10-CM | POA: Diagnosis not present

## 2016-10-02 DIAGNOSIS — F17210 Nicotine dependence, cigarettes, uncomplicated: Secondary | ICD-10-CM | POA: Diagnosis not present

## 2016-10-02 DIAGNOSIS — T451X5S Adverse effect of antineoplastic and immunosuppressive drugs, sequela: Secondary | ICD-10-CM | POA: Insufficient documentation

## 2016-10-02 DIAGNOSIS — G620 Drug-induced polyneuropathy: Secondary | ICD-10-CM | POA: Diagnosis not present

## 2016-10-02 DIAGNOSIS — G894 Chronic pain syndrome: Secondary | ICD-10-CM

## 2016-10-02 DIAGNOSIS — Z5181 Encounter for therapeutic drug level monitoring: Secondary | ICD-10-CM

## 2016-10-02 DIAGNOSIS — M79605 Pain in left leg: Secondary | ICD-10-CM | POA: Diagnosis not present

## 2016-10-02 DIAGNOSIS — M545 Low back pain: Secondary | ICD-10-CM | POA: Insufficient documentation

## 2016-10-02 DIAGNOSIS — M961 Postlaminectomy syndrome, not elsewhere classified: Secondary | ICD-10-CM | POA: Diagnosis not present

## 2016-10-02 DIAGNOSIS — G8929 Other chronic pain: Secondary | ICD-10-CM | POA: Diagnosis not present

## 2016-10-02 DIAGNOSIS — T451X5A Adverse effect of antineoplastic and immunosuppressive drugs, initial encounter: Secondary | ICD-10-CM | POA: Diagnosis not present

## 2016-10-02 DIAGNOSIS — Z760 Encounter for issue of repeat prescription: Secondary | ICD-10-CM | POA: Diagnosis not present

## 2016-10-02 MED ORDER — HYDROCODONE-ACETAMINOPHEN 10-325 MG PO TABS: ORAL_TABLET | ORAL | 0 refills | Status: DC

## 2016-10-02 NOTE — Progress Notes (Signed)
Subjective:    Patient ID: Elizabeth Torres, female    DOB: 1975-10-31, 41 y.o.   MRN: [id number]  HPI: [name] is a 41year old female who returns for follow up appointmentfor chronic pain and medication refill. She states her pain is located in her lower back and bilaterallower extremities posteriorly.   She rates her pain 3. Her current exercise regime is walking.   She's working 40 hours a week.  Pain Inventory Average Pain 4 Pain Right Now 3 My pain is constant, dull and aching  In the last 24 hours, has pain interfered with the following? General activity 3 Relation with others 3 Enjoyment of life 2 What TIME of day is your pain at its worst? evening Sleep (in general) Fair  Pain is worse with: sitting, inactivity and standing Pain improves with: rest and medication Relief from Meds: 5  Mobility Do you have any goals in this area?  no  Function Do you have any goals in this area?  no  Neuro/Psych No problems in this area  Prior Studies Any changes since last visit?  no  Physicians involved in your care Any changes since last visit?  no   Family History  Problem Relation Age of Onset  . Diabetes Mother   . Hypertension Mother   . Cancer Paternal Uncle   . Breast cancer Paternal Uncle     40'S  . Breast cancer Paternal Aunt     22'S   Social History   Social History  . Marital status: Married    Spouse name: N/A  . Number of children: N/A  . Years of education: N/A   Social History Main Topics  . Smoking status: Current Every Day Smoker    Packs/day: 1.00    Types: Cigarettes  . Smokeless tobacco: Never Used  . Alcohol use Yes     Comment: socially  . Drug use: No  . Sexual activity: Not on file   Other Topics Concern  . Not on file   Social History Narrative  . No narrative on file   Past Surgical History:  Procedure Laterality Date  . BREAST SURGERY    . mastectomy Right    partial with lymph node dissection  . PORT A  CATH REVISION    . PORT-A-CATH REMOVAL  11-07-15   Dr Bary Castilla  . SPINE SURGERY  2008   Dr Joya Salm   Past Medical History:  Diagnosis Date  . BRCA negative   . Breast cancer ([place]) 2013   RT LUMPECTOMY  . Cancer ([place])   . Radiation 2013   BREAST CA  . Status post chemotherapy 2013   BREAST CA   There were no vitals taken for this visit.  Opioid Risk Score:   Fall Risk Score:  `1  Depression screen PHQ 2/9  Depression screen [facility] 2/9 03/13/2016 10/10/2015 08/15/2015 04/17/2015 02/19/2015 11/14/2014  Decreased Interest 0 0 0 0 0 0  Down, Depressed, Hopeless 0 0 0 0 0 0  PHQ - 2 Score 0 0 0 0 0 0  Altered sleeping - 2 - - 1 1  Tired, decreased energy - 1 - - 1 1  Change in appetite - 0 - - 0 0  Feeling bad or failure about yourself  - 0 - - 0 0  Trouble concentrating - 0 - - 0 0  Moving slowly or fidgety/restless - 0 - - 0 0  Suicidal thoughts - - - - 0  0  PHQ-9 Score - 3 - - 2 2  Difficult doing work/chores - Not difficult at all - - - -    Review of Systems  Constitutional: Negative.   HENT: Negative.   Eyes: Negative.   Respiratory: Negative.   Cardiovascular: Negative.   Gastrointestinal: Negative.   Endocrine: Negative.   Genitourinary: Negative.   Musculoskeletal: Negative.   Skin: Negative.   Allergic/Immunologic: Negative.   Neurological: Negative.   Hematological: Negative.   Psychiatric/Behavioral: Negative.   All other systems reviewed and are negative.      Objective:   Physical Exam  Constitutional: She is oriented to person, place, and time. She appears well-developed and well-nourished.  HENT:  Head: Normocephalic and atraumatic.  Neck: Normal range of motion. Neck supple.  Cardiovascular: Normal rate and regular rhythm.   Pulmonary/Chest: Effort normal and breath sounds normal.  Musculoskeletal:  Normal Muscle Bulk and Muscle Testing Reveals: Upper Extremities: Full ROM and Muscle Strength 5/5 Back without spinal tenderness Lower Extremities: Full  ROM and Muscle Strength 5/5 Arises from chair with ease Narrow Based gait   Neurological: She is alert and oriented to person, place, and time.  Skin: Skin is warm and dry.  Psychiatric: She has a normal mood and affect.  Nursing note and vitals reviewed.         Assessment & Plan:  1. Lumbar postlaminectomy syndrome status post L5-S1 fusion with chronic S1 radiculopathy. 10/02/2016 Continue: Hydrocodone 10/325 mg #120--one every 6 hours as needed for pain. We will continue the opioid monitoring program, this consists of regular clinic visits, examinations, urine drug screen, pill counts as well as use of [place] Controlled Substance reporting System. 2. 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 10/02/2016 3. Insomnia: Continue Ambien. 0-3/09/2016  15 minutes of face to face patient care time was spent during this visit. All questions were encouraged and answered.   F/U in 24month

## 2016-10-11 NOTE — Progress Notes (Signed)
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   INTERVAL HISTORY: Patient returns to clinic for routine yearly evaluation. She continues to have has some mild tenderness at her surgical site on her right breast, but otherwise feels well.  She is highly anxious, but otherwise feels well and is asymptomatic.  She has no neurologic complaints.  She denies any recent fevers. She has a good appetite and denies weight loss. She denies any chest pain or shortness of breath.  She has no nausea, vomiting, constipation, or diarrhea.  She has no urinary complaints.  Patient offers no further specific complaints today.  REVIEW OF SYSTEMS:   Review of Systems  Constitutional: Negative.  Negative for fever, malaise/fatigue and weight loss.  Respiratory: Negative.  Negative for cough and shortness of breath.   Cardiovascular: Negative.  Negative for chest pain and leg swelling.  Gastrointestinal: Negative.  Negative for abdominal pain.  Genitourinary: Negative.   Musculoskeletal: Negative.   Neurological: Negative.  Negative for sensory change and weakness.  Psychiatric/Behavioral: The patient is nervous/anxious.     As per HPI. Otherwise, a complete review of systems is negative.  PAST MEDICAL HISTORY: Past Medical History:  Diagnosis Date  . BRCA negative   . Breast cancer ([place]) 2013   RT LUMPECTOMY  . Cancer ([place])   . Radiation 2013   BREAST CA  . Status post chemotherapy 2013   BREAST CA    PAST SURGICAL HISTORY: Past Surgical History:  Procedure Laterality Date  . BREAST SURGERY    . mastectomy Right    partial with lymph node dissection  . PORT A CATH REVISION    . PORT-A-CATH REMOVAL  11-07-15   Dr Bary Castilla  . SPINE SURGERY  2008   Dr Joya Salm    FAMILY HISTORY Family History  Problem Relation Age of Onset  . Diabetes Mother   . Hypertension Mother   . Cancer Paternal Uncle   . Breast cancer Paternal Uncle     40'S  . Breast cancer Paternal Aunt     92'S       ADVANCED DIRECTIVES:    HEALTH MAINTENANCE: Social History  Substance Use Topics  . Smoking status: Current Every Day Smoker    Packs/day: 1.00    Types: Cigarettes  . Smokeless tobacco: Never Used  . Alcohol use Yes     Comment: socially     Colonoscopy:  PAP:  Bone density:  Lipid panel:  Allergies  Allergen Reactions  . Penicillins Anaphylaxis  . Sulfa Antibiotics Anaphylaxis  . Neurontin [Gabapentin] Other (See Comments)    lethargy  . Ondansetron     Other reaction(s): Other (See Comments) migraines  . Zofran [Ondansetron Hcl]     Current Outpatient Prescriptions  Medication Sig Dispense Refill  . HYDROcodone-acetaminophen (NORCO) 10-325 MG tablet TAKE 1 TABLET BY MOUTH EVERY 6 HOURS AS NEEDED FOR PAIN 120 tablet 0  . ibuprofen (ADVIL,MOTRIN) 800 MG tablet Take 800 mg by mouth every 6 (six) hours as needed (pain).     . nortriptyline (PAMELOR) 10 MG capsule Take 2 capsules (20 mg total) by mouth at bedtime. 180 capsule 0  .  tranexamic acid (LYSTEDA) 650 MG TABS tablet Take by mouth.    . zolpidem (AMBIEN) 5 MG tablet Take 1 tablet (5 mg total) by mouth at bedtime as needed. 30 tablet 2   No current facility-administered medications for this visit.     OBJECTIVE: Vitals:   10/13/16 1152  BP: 127/86  Pulse: 84  Temp: 97 F (36.1 C)     Body mass index is 29.64 kg/m.    ECOG FS:0 - Asymptomatic  General: Well-developed, well-nourished, no acute distress. Eyes: Pink conjunctiva, anicteric sclera. Breasts: Bilateral breast and axilla without lumps or masses. Lungs: Clear to auscultation  bilaterally. Heart: Regular rate and rhythm. No rubs, murmurs, or gallops. Abdomen: Soft, nontender, nondistended. No organomegaly noted, normoactive bowel sounds. Musculoskeletal: No edema, cyanosis, or clubbing. Neuro: Alert, answering all questions appropriately. Cranial nerves grossly intact. Skin: No rashes or petechiae noted. Psych: Normal affect.   LAB RESULTS:  Lab Results  Component Value Date   NA 136 02/19/2015   K 3.8 02/19/2015   CL 102 02/19/2015   CO2 28 02/19/2015   GLUCOSE 100 (H) 02/19/2015   BUN 7 02/19/2015   CREATININE 0.72 02/19/2015   CALCIUM 9.4 02/19/2015   PROT 6.3 (L) 02/19/2015   ALBUMIN 3.9 02/19/2015   AST 27 02/19/2015   ALT 23 02/19/2015   ALKPHOS 59 02/19/2015   BILITOT 0.7 02/19/2015   GFRNONAA >60 02/19/2015   GFRAA >60 02/19/2015    Lab Results  Component Value Date   WBC 8.3 02/19/2015   NEUTROABS 4.5 02/19/2015   HGB 14.2 02/19/2015   HCT 41.3 02/19/2015   MCV 93.0 02/19/2015   PLT 233 02/19/2015   Lab Results  Component Value Date   LABCA2 31.7 10/13/2016     STUDIES: No results found.  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   PLAN:    1. 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  Patient completed chemotherapy with AC-Taxol in July 2013. She completed adjuvant XRT in October 2013.patient's most recent mammogram on October 31, 2015 was reported as BI-RADS 2, repeat in March 2018.  She does not require tamoxifen given the ER/PR status of her tumor. Patient has requested minimal follow-up, but has agreed to return to clinic in 1 year for further evaluation.  2.  Peripheral neuropathy: Resolved.  Patient expressed understanding and was in agreement with this plan. She also understands that She can call clinic at any time with any questions, concerns, or complaints.   Lloyd Huger, MD    10/16/2016 9:12 AM

## 2016-10-13 ENCOUNTER — Inpatient Hospital Stay: Payer: 59 | Attending: Oncology | Admitting: Oncology

## 2016-10-13 ENCOUNTER — Inpatient Hospital Stay: Payer: 59

## 2016-10-13 ENCOUNTER — Encounter: Payer: Self-pay | Admitting: Oncology

## 2016-10-13 VITALS — BP 127/86 | HR 84 | Temp 97.0°F | Wt 167.3 lb

## 2016-10-13 DIAGNOSIS — C50511 Malignant neoplasm of lower-outer quadrant of right female breast: Secondary | ICD-10-CM | POA: Insufficient documentation

## 2016-10-13 DIAGNOSIS — Z923 Personal history of irradiation: Secondary | ICD-10-CM | POA: Diagnosis not present

## 2016-10-13 DIAGNOSIS — Z9011 Acquired absence of right breast and nipple: Secondary | ICD-10-CM | POA: Diagnosis not present

## 2016-10-13 DIAGNOSIS — F17210 Nicotine dependence, cigarettes, uncomplicated: Secondary | ICD-10-CM | POA: Diagnosis not present

## 2016-10-13 DIAGNOSIS — Z9221 Personal history of antineoplastic chemotherapy: Secondary | ICD-10-CM | POA: Diagnosis not present

## 2016-10-13 DIAGNOSIS — Z171 Estrogen receptor negative status [ER-]: Secondary | ICD-10-CM | POA: Insufficient documentation

## 2016-10-14 ENCOUNTER — Telehealth: Payer: Self-pay | Admitting: Physical Medicine & Rehabilitation

## 2016-10-14 LAB — CANCER ANTIGEN 27.29: CA 27.29: 31.7 U/mL (ref 0.0–38.6)

## 2016-10-14 NOTE — Telephone Encounter (Signed)
May schedule.  

## 2016-10-14 NOTE — Telephone Encounter (Signed)
ptn called and wants trigger point in cervical area again - do you want to proceed with me booking?

## 2016-10-29 ENCOUNTER — Encounter: Payer: Self-pay | Admitting: Physical Medicine & Rehabilitation

## 2016-10-29 ENCOUNTER — Encounter: Payer: Self-pay | Admitting: Registered Nurse

## 2016-10-29 ENCOUNTER — Ambulatory Visit (HOSPITAL_BASED_OUTPATIENT_CLINIC_OR_DEPARTMENT_OTHER): Payer: 59 | Admitting: Physical Medicine & Rehabilitation

## 2016-10-29 ENCOUNTER — Encounter: Payer: 59 | Admitting: Registered Nurse

## 2016-10-29 VITALS — BP 136/92 | HR 93 | Resp 14

## 2016-10-29 DIAGNOSIS — Z760 Encounter for issue of repeat prescription: Secondary | ICD-10-CM | POA: Diagnosis not present

## 2016-10-29 DIAGNOSIS — M791 Myalgia: Secondary | ICD-10-CM | POA: Diagnosis not present

## 2016-10-29 DIAGNOSIS — M7918 Myalgia, other site: Secondary | ICD-10-CM

## 2016-10-29 MED ORDER — HYDROCODONE-ACETAMINOPHEN 10-325 MG PO TABS: ORAL_TABLET | ORAL | 0 refills | Status: DC

## 2016-10-29 NOTE — Progress Notes (Signed)
  Trigger Point Injection  Indication: Cervical Myofascial pain not relieved by medication management and other conservative care.  Informed consent was obtained after describing risk and benefits of the procedure with the patient, this includes bleeding, bruising, infection and medication side effects.  The patient wishes to proceed and has given written consent.  The patient was placed in a seated position.  The Left and right upper trap and levator scapulae area was marked and prepped with Betadine.  It was entered with a 25-gauge 1-1/2 inch needle and 1 mL of 1% lidocaine was injected into each of 6 trigger points, after negative draw back for blood.  The patient tolerated the procedure well.  Post procedure instructions were given.

## 2016-10-29 NOTE — Progress Notes (Signed)
Subjective:    Patient ID: Elizabeth Torres, female    DOB: [birth year], 41 y.o.   MRN: [id number]  HPI: [name] arrived in excruciating neck pain, she states pain began two weeks ago. She's scheduled to have cervical trigger point on 11/05/2016. Spoke with Dr. Letta Pate, he will perform Trigger Point Injection today.    Pain Inventory Average Pain 4 Pain Right Now 3 My pain is constant, dull and aching  In the last 24 hours, has pain interfered with the following? General activity 3 Relation with others 3 Enjoyment of life 2 What TIME of day is your pain at its worst? evening Sleep (in general) Fair  Pain is worse with: sitting, inactivity and standing Pain improves with: rest and medication Relief from Meds: 5  Mobility walk without assistance Do you have any goals in this area?  no  Function Do you have any goals in this area?  no  Neuro/Psych No problems in this area  Prior Studies Any changes since last visit?  no  Physicians involved in your care Any changes since last visit?  no   Family History  Problem Relation Age of Onset  . Diabetes Mother   . Hypertension Mother   . Cancer Paternal Uncle   . Breast cancer Paternal Uncle     40'S  . Breast cancer Paternal Aunt     65'S   Social History   Social History  . Marital status: Married    Spouse name: N/A  . Number of children: N/A  . Years of education: N/A   Social History Main Topics  . Smoking status: Current Every Day Smoker    Packs/day: 1.00    Types: Cigarettes  . Smokeless tobacco: Never Used  . Alcohol use Yes     Comment: socially  . Drug use: No  . Sexual activity: Not Asked   Other Topics Concern  . None   Social History Narrative  . None   Past Surgical History:  Procedure Laterality Date  . BREAST SURGERY    . mastectomy Right    partial with lymph node dissection  . PORT A CATH REVISION    . PORT-A-CATH REMOVAL  11-07-15   Dr Bary Castilla  . SPINE SURGERY  2008   Dr Joya Salm    Past Medical History:  Diagnosis Date  . BRCA negative   . Breast cancer ([place]) 2013   RT LUMPECTOMY  . Cancer ([place])   . Radiation 2013   BREAST CA  . Status post chemotherapy 2013   BREAST CA   BP (!) 136/92 (BP Location: Left Arm, Patient Position: Sitting, Cuff Size: Normal)   Pulse 93   Resp 14   SpO2 95%   Opioid Risk Score:   Fall Risk Score:  `1  Depression screen PHQ 2/9  Depression screen [facility] 2/9 03/13/2016 10/10/2015 08/15/2015 04/17/2015 02/19/2015 11/14/2014  Decreased Interest 0 0 0 0 0 0  Down, Depressed, Hopeless 0 0 0 0 0 0  PHQ - 2 Score 0 0 0 0 0 0  Altered sleeping - 2 - - 1 1  Tired, decreased energy - 1 - - 1 1  Change in appetite - 0 - - 0 0  Feeling bad or failure about yourself  - 0 - - 0 0  Trouble concentrating - 0 - - 0 0  Moving slowly or fidgety/restless - 0 - - 0 0  Suicidal thoughts - - - - 0 0  PHQ-9 Score -  3 - - 2 2  Difficult doing work/chores - Not difficult at all - - - -    Review of Systems  Constitutional: Negative.   HENT: Negative.   Eyes: Negative.   Respiratory: Negative.   Cardiovascular: Negative.   Gastrointestinal: Negative.   Endocrine: Negative.   Genitourinary: Negative.   Musculoskeletal: Positive for back pain.  Skin: Negative.   Allergic/Immunologic: Negative.   Neurological: Negative.   Hematological: Negative.   Psychiatric/Behavioral: Negative.   All other systems reviewed and are negative.      Objective:   Physical Exam Patient Transferred to Dr. Letta Pate Scheduled       Assessment & Plan:  Cervical Myofascial Pain Syndrome: Transferred to Dr. Letta Pate. He will Perform Cervical Trigger Point Injection.

## 2016-11-03 ENCOUNTER — Ambulatory Visit
Admission: RE | Admit: 2016-11-03 | Discharge: 2016-11-03 | Disposition: A | Discharge status: Home / Self Care | Payer: 59 | Source: Ambulatory Visit | Attending: Oncology | Admitting: Oncology

## 2016-11-03 ENCOUNTER — Other Ambulatory Visit: Payer: Self-pay | Admitting: Oncology

## 2016-11-03 DIAGNOSIS — C50511 Malignant neoplasm of lower-outer quadrant of right female breast: Secondary | ICD-10-CM

## 2016-11-03 DIAGNOSIS — Z853 Personal history of malignant neoplasm of breast: Secondary | ICD-10-CM | POA: Diagnosis not present

## 2016-11-03 DIAGNOSIS — Z09 Encounter for follow-up examination after completed treatment for conditions other than malignant neoplasm: Secondary | ICD-10-CM | POA: Diagnosis not present

## 2016-11-03 DIAGNOSIS — N6452 Nipple discharge: Secondary | ICD-10-CM | POA: Insufficient documentation

## 2016-11-03 DIAGNOSIS — Z803 Family history of malignant neoplasm of breast: Secondary | ICD-10-CM | POA: Insufficient documentation

## 2016-11-03 HISTORY — DX: Personal history of antineoplastic chemotherapy: Z92.21

## 2016-11-04 ENCOUNTER — Telehealth: Payer: Self-pay | Admitting: *Deleted

## 2016-11-04 NOTE — Telephone Encounter (Signed)
She wanted to let you know that she has screws in her lower back by Dr Joya Salm at [facility]. She will call Dr Dwyane Luo office for an appt

## 2016-11-04 NOTE — Telephone Encounter (Signed)
Called to states that when she had her mammogram yesterday, they told her she needs an MRI done and is asking what is to happen next. Please call her back with plans  IMPRESSION: No mammographic or sonographic evidence of malignancy.  RECOMMENDATION: Recommend surgical consultation with consideration of breast MRI for further evaluation of the patient's left nipple discharge. Also, the patient had an aunt and uncle diagnosed with breast cancer. I suspect her lifetime risk of breast cancer is greater than 20% and annual breast MRI is recommended.  I have discussed the findings and recommendations with the patient. Results were also provided in writing at the conclusion of the visit. If applicable, a reminder letter will be sent to the patient regarding the next appointment

## 2016-11-04 NOTE — Telephone Encounter (Signed)
Per pt, Dr Curly Shores office wants a referral sent over. Dr Grayland Ormond notified of this and he had me to schedule patient to see him instead. She agrees to come 11/09/16 @ 1030

## 2016-11-04 NOTE — Telephone Encounter (Signed)
i'd defer to surgery to evaluate before doing an MRI.

## 2016-11-05 ENCOUNTER — Ambulatory Visit: Payer: 59 | Admitting: Physical Medicine & Rehabilitation

## 2016-11-06 ENCOUNTER — Telehealth: Payer: Self-pay

## 2016-11-06 MED ORDER — ZOLPIDEM TARTRATE 5 MG PO TABS: 5.0000 mg | ORAL_TABLET | Freq: Every evening | ORAL | 2 refills | Status: DC | PRN

## 2016-11-06 NOTE — Telephone Encounter (Signed)
Called to pharmacy and [facility] notified by VM

## 2016-11-06 NOTE — Telephone Encounter (Signed)
Patient would like a refill on her medication ambien. Patient states she has left several messages regarding this. -is it okay to refill? please advise.

## 2016-11-08 NOTE — Progress Notes (Signed)
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   INTERVAL HISTORY: Patient returns to clinic as an add-on after reporting she had nipple discharge at her most recent mammogram. Patient states his only happened once and has not recurred. She otherwise feels well and is asymptomatic. She continues to be highly anxious. She has no neurologic complaints.  She denies any recent fevers. She has a good appetite and denies weight loss. She denies any chest pain or shortness of breath.  She has no nausea, vomiting, constipation, or diarrhea.  She has no urinary complaints.  Patient offers no further specific complaints today.  REVIEW OF SYSTEMS:   Review of Systems  Constitutional: Negative.  Negative for fever, malaise/fatigue and weight loss.  Respiratory: Negative.  Negative for cough and shortness of breath.   Cardiovascular: Negative.  Negative for chest pain and leg swelling.  Gastrointestinal: Negative.  Negative for abdominal pain.  Genitourinary: Negative.   Musculoskeletal: Negative.   Neurological: Negative.  Negative for sensory change and weakness.  Psychiatric/Behavioral: The patient is nervous/anxious.     As per HPI. Otherwise, a complete review of systems is negative.  PAST MEDICAL HISTORY: Past Medical History:  Diagnosis Date  . BRCA negative   . Breast cancer ([place]) 2013   RT LUMPECTOMY with chemo and rad tx  . Cancer ([place])   . Personal history of chemotherapy 2013   for breast ca  . Radiation 2013   BREAST CA  . Status post chemotherapy 2013   BREAST CA    PAST SURGICAL HISTORY: Past  Surgical History:  Procedure Laterality Date  . BREAST BIOPSY Right 07/2011   invasive ductal carcinoma  . 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 RAd and chemo tx  . BREAST SURGERY    . mastectomy Right    partial with lymph node dissection  . PORT A CATH REVISION    . PORT-A-CATH REMOVAL  11-07-15   Dr Bary Castilla  . SPINE SURGERY  2008   Dr Joya Salm    FAMILY HISTORY Family History  Problem Relation Age of Onset  . Diabetes Mother   . Hypertension Mother   . Cancer Paternal Uncle   . Breast cancer Paternal Uncle     40'S  . Breast cancer Paternal Aunt     41'S       ADVANCED DIRECTIVES:    HEALTH MAINTENANCE: Social History  Substance Use Topics  . Smoking status: Current Every Day Smoker    Packs/day: 1.00    Types: Cigarettes  . Smokeless tobacco: Never Used  . Alcohol use Yes     Comment: socially     Colonoscopy:  PAP:  Bone density:  Lipid panel:  Allergies  Allergen Reactions  . Penicillins Anaphylaxis  . Sulfa Antibiotics Anaphylaxis  . Neurontin [Gabapentin] Other (See Comments)    lethargy  . Ondansetron     Other reaction(s): Other (See Comments) migraines  . Zofran [Ondansetron Hcl]     Current Outpatient Prescriptions  Medication Sig Dispense Refill  . HYDROcodone-acetaminophen (NORCO) 10-325 MG tablet TAKE 1 TABLET BY MOUTH EVERY 6 HOURS AS NEEDED FOR  PAIN 120 tablet 0  . ibuprofen (ADVIL,MOTRIN) 800 MG tablet Take 800 mg by mouth every 6 (six) hours as needed (pain).     . nortriptyline (PAMELOR) 10 MG capsule Take 2 capsules (20 mg total) by mouth at bedtime. 180 capsule 0  . zolpidem (AMBIEN) 5 MG tablet Take 1 tablet (5 mg total) by mouth at bedtime as needed. 30 tablet 2   No current facility-administered medications for this visit.     OBJECTIVE: Vitals:   11/09/16 1042  BP: 127/86  Pulse: (!) 112  Temp: 97.9 F (36.6 C)     Body mass index is 29.14 kg/m.    ECOG FS:0 -  Asymptomatic  General: Well-developed, well-nourished, no acute distress. Eyes: Pink conjunctiva, anicteric sclera. Breasts: Bilateral breast and axilla without lumps or masses. Lungs: Clear to auscultation bilaterally. Heart: Regular rate and rhythm. No rubs, murmurs, or gallops. Abdomen: Soft, nontender, nondistended. No organomegaly noted, normoactive bowel sounds. Musculoskeletal: No edema, cyanosis, or clubbing. Neuro: Alert, answering all questions appropriately. Cranial nerves grossly intact. Skin: No rashes or petechiae noted. Psych: Normal affect.   LAB RESULTS:  Lab Results  Component Value Date   NA 136 02/19/2015   K 3.8 02/19/2015   CL 102 02/19/2015   CO2 28 02/19/2015   GLUCOSE 100 (H) 02/19/2015   BUN 7 02/19/2015   CREATININE 0.72 02/19/2015   CALCIUM 9.4 02/19/2015   PROT 6.3 (L) 02/19/2015   ALBUMIN 3.9 02/19/2015   AST 27 02/19/2015   ALT 23 02/19/2015   ALKPHOS 59 02/19/2015   BILITOT 0.7 02/19/2015   GFRNONAA >60 02/19/2015   GFRAA >60 02/19/2015    Lab Results  Component Value Date   WBC 8.3 02/19/2015   NEUTROABS 4.5 02/19/2015   HGB 14.2 02/19/2015   HCT 41.3 02/19/2015   MCV 93.0 02/19/2015   PLT 233 02/19/2015   Lab Results  Component Value Date   LABCA2 31.7 10/13/2016     STUDIES: US Breast Limited Uni Left Inc Axilla  Result Date: 11/03/2016 CLINICAL DATA:  Annual mammography. Recent single episode of clear unilateral single duct non spontaneous nipple discharge cyst on the left. EXAM: 2D DIGITAL DIAGNOSTIC BILATERAL MAMMOGRAM WITH CAD AND ADJUNCT TOMO ULTRASOUND LEFT BREAST COMPARISON:  Previous exam(s). ACR Breast Density Category c: The breast tissue is heterogeneously dense, which may obscure small masses. FINDINGS: The right lumpectomy site appears as expected. There are benign calcifications on the left. One of them layers superiorly. No other suspicious mammographic findings. Mammographic images were processed with CAD. On  physical exam, no suspicious lumps are identified. Targeted ultrasound is performed, showing fibrocystic changes throughout the left breast correlating with mammographic findings. No intraductal masses or cause for nipple discharge identified. IMPRESSION: No mammographic or sonographic evidence of malignancy. RECOMMENDATION: Recommend surgical consultation with consideration of breast MRI for further evaluation of the patient's left nipple discharge. Also, the patient had an aunt and uncle diagnosed with breast cancer. I suspect her lifetime risk of breast cancer is greater than 20% and annual breast MRI is recommended. I have discussed the findings and recommendations with the patient. Results were also provided in writing at the conclusion of the visit. If applicable, a reminder letter will be sent to the patient regarding the next appointment. BI-RADS CATEGORY  2: Benign. Electronically Signed   By: Dorise Bullion III M.D   On: 11/03/2016 11:19   Mm Diag Breast Tomo Bilateral  Result Date: 11/03/2016 CLINICAL DATA:  Annual mammography. Recent single  episode of clear unilateral single duct non spontaneous nipple discharge cyst on the left. EXAM: 2D DIGITAL DIAGNOSTIC BILATERAL MAMMOGRAM WITH CAD AND ADJUNCT TOMO ULTRASOUND LEFT BREAST COMPARISON:  Previous exam(s). ACR Breast Density Category c: The breast tissue is heterogeneously dense, which may obscure small masses. FINDINGS: The right lumpectomy site appears as expected. There are benign calcifications on the left. One of them layers superiorly. No other suspicious mammographic findings. Mammographic images were processed with CAD. On physical exam, no suspicious lumps are identified. Targeted ultrasound is performed, showing fibrocystic changes throughout the left breast correlating with mammographic findings. No intraductal masses or cause for nipple discharge identified. IMPRESSION: No mammographic or sonographic evidence of malignancy. RECOMMENDATION:  Recommend surgical consultation with consideration of breast MRI for further evaluation of the patient's left nipple discharge. Also, the patient had an aunt and uncle diagnosed with breast cancer. I suspect her lifetime risk of breast cancer is greater than 20% and annual breast MRI is recommended. I have discussed the findings and recommendations with the patient. Results were also provided in writing at the conclusion of the visit. If applicable, a reminder letter will be sent to the patient regarding the next appointment. BI-RADS CATEGORY  2: Benign. 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   PLAN:    1. 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  Patient completed chemotherapy with AC-Taxol in July 2013. She completed adjuvant XRT in October 2013. Patient's most recent mammogram and ultrasound on November 03, 2016 was reported as BI-RADS 2. She does not require breast MRI at this time. She does not require tamoxifen given the ER/PR status of her tumor. Return to clinic as previously scheduled for routine evaluation. 2.  Peripheral neuropathy: Resolved. 3. Nipple discharge: Unclear etiology. Patient reports it was a one-time occurrence and has not recurred. She did agree that if it occurs again will likely order breast MRI for further evaluation.  Approximately 30 minutes was spent in discussion of which greater than 50% was consultation.  Patient expressed understanding and was in agreement with this plan. She also understands that She can call clinic at any time with any questions, concerns, or complaints.   Lloyd Huger, MD   11/15/2016 10:41 AM

## 2016-11-09 ENCOUNTER — Inpatient Hospital Stay: Payer: 59 | Attending: Oncology | Admitting: Oncology

## 2016-11-09 VITALS — BP 127/86 | HR 112 | Temp 97.9°F | Wt 164.5 lb

## 2016-11-09 DIAGNOSIS — N6452 Nipple discharge: Secondary | ICD-10-CM | POA: Insufficient documentation

## 2016-11-09 DIAGNOSIS — Z9221 Personal history of antineoplastic chemotherapy: Secondary | ICD-10-CM | POA: Diagnosis not present

## 2016-11-09 DIAGNOSIS — F17210 Nicotine dependence, cigarettes, uncomplicated: Secondary | ICD-10-CM | POA: Diagnosis not present

## 2016-11-09 DIAGNOSIS — Z9011 Acquired absence of right breast and nipple: Secondary | ICD-10-CM | POA: Insufficient documentation

## 2016-11-09 DIAGNOSIS — Z923 Personal history of irradiation: Secondary | ICD-10-CM | POA: Diagnosis not present

## 2016-11-09 DIAGNOSIS — Z171 Estrogen receptor negative status [ER-]: Secondary | ICD-10-CM | POA: Insufficient documentation

## 2016-11-09 DIAGNOSIS — Z853 Personal history of malignant neoplasm of breast: Secondary | ICD-10-CM | POA: Insufficient documentation

## 2016-11-09 DIAGNOSIS — C50511 Malignant neoplasm of lower-outer quadrant of right female breast: Secondary | ICD-10-CM

## 2016-11-09 NOTE — Progress Notes (Signed)
Patient having discharge from left breast and a "feeling of itching on the inside.

## 2016-12-01 ENCOUNTER — Telehealth: Payer: Self-pay | Admitting: Registered Nurse

## 2016-12-01 ENCOUNTER — Encounter: Payer: 59 | Attending: Physical Medicine and Rehabilitation | Admitting: Registered Nurse

## 2016-12-01 ENCOUNTER — Encounter: Payer: Self-pay | Admitting: Registered Nurse

## 2016-12-01 VITALS — BP 139/88 | HR 82

## 2016-12-01 DIAGNOSIS — M961 Postlaminectomy syndrome, not elsewhere classified: Secondary | ICD-10-CM | POA: Diagnosis not present

## 2016-12-01 DIAGNOSIS — M545 Low back pain: Secondary | ICD-10-CM | POA: Insufficient documentation

## 2016-12-01 DIAGNOSIS — G8929 Other chronic pain: Secondary | ICD-10-CM | POA: Insufficient documentation

## 2016-12-01 DIAGNOSIS — G620 Drug-induced polyneuropathy: Secondary | ICD-10-CM | POA: Diagnosis not present

## 2016-12-01 DIAGNOSIS — Z760 Encounter for issue of repeat prescription: Secondary | ICD-10-CM | POA: Insufficient documentation

## 2016-12-01 DIAGNOSIS — T451X5A Adverse effect of antineoplastic and immunosuppressive drugs, initial encounter: Secondary | ICD-10-CM | POA: Diagnosis not present

## 2016-12-01 DIAGNOSIS — F17210 Nicotine dependence, cigarettes, uncomplicated: Secondary | ICD-10-CM | POA: Insufficient documentation

## 2016-12-01 DIAGNOSIS — M5010 Cervical disc disorder with radiculopathy, unspecified cervical region: Secondary | ICD-10-CM

## 2016-12-01 DIAGNOSIS — Z9221 Personal history of antineoplastic chemotherapy: Secondary | ICD-10-CM | POA: Diagnosis not present

## 2016-12-01 DIAGNOSIS — T451X5S Adverse effect of antineoplastic and immunosuppressive drugs, sequela: Secondary | ICD-10-CM | POA: Insufficient documentation

## 2016-12-01 DIAGNOSIS — Z79899 Other long term (current) drug therapy: Secondary | ICD-10-CM | POA: Diagnosis not present

## 2016-12-01 DIAGNOSIS — Z5181 Encounter for therapeutic drug level monitoring: Secondary | ICD-10-CM

## 2016-12-01 DIAGNOSIS — M79604 Pain in right leg: Secondary | ICD-10-CM | POA: Insufficient documentation

## 2016-12-01 DIAGNOSIS — M79605 Pain in left leg: Secondary | ICD-10-CM | POA: Insufficient documentation

## 2016-12-01 DIAGNOSIS — M791 Myalgia: Secondary | ICD-10-CM

## 2016-12-01 DIAGNOSIS — M5416 Radiculopathy, lumbar region: Secondary | ICD-10-CM

## 2016-12-01 DIAGNOSIS — G894 Chronic pain syndrome: Secondary | ICD-10-CM

## 2016-12-01 DIAGNOSIS — M7918 Myalgia, other site: Secondary | ICD-10-CM

## 2016-12-01 MED ORDER — NORTRIPTYLINE HCL 10 MG PO CAPS: 20.0000 mg | ORAL_CAPSULE | Freq: Every day | ORAL | 0 refills | Status: DC

## 2016-12-01 MED ORDER — HYDROCODONE-ACETAMINOPHEN 10-325 MG PO TABS: ORAL_TABLET | ORAL | 0 refills | Status: DC

## 2016-12-01 NOTE — Telephone Encounter (Signed)
On 12/01/2016 the [place] was reviewed no conflict was seen on the [facility] with multiple prescribers. Elizabeth Torres has a signed narcotic contract with our office. If there were any discrepancies this would have been reported to her physician.

## 2016-12-01 NOTE — Progress Notes (Signed)
Subjective:    Patient ID: Elizabeth Torres, female    DOB: 25-May-1976, 41 y.o.   MRN: [id number]  HPI: Elizabeth Torres is a 41year old female who returns for follow up appointmentfor chronic pain and medication refill. She states her pain is located in her neck radiating into her right shoulder, lower back radiating into her bilaterallower extremities. She rates her pain 6. Her current exercise regime is walking.   S/P Trigger Point Injection relief for 2 days only. Will prescribe Lyrica 50 mg BID, spoke with Dr. Letta Pate he's in agreement. Will ask for a PA to be performed.  She verbalizes understanding.   Last UDS was on 08/05/2016 it was consistent,  UDS ordered today.  She's working 40 hours a week.   Pain Inventory Average Pain 4 Pain Right Now 6 My pain is constant, dull and aching  In the last 24 hours, has pain interfered with the following? General activity 3 Relation with others 3 Enjoyment of life 2 What TIME of day is your pain at its worst? evening Sleep (in general) Fair  Pain is worse with: sitting, inactivity and standing Pain improves with: rest and medication Relief from Meds: 5  Mobility walk without assistance  Function Do you have any goals in this area?  no  Neuro/Psych weakness numbness tingling  Prior Studies Any changes since last visit?  no  Physicians involved in your care Any changes since last visit?  no   Family History  Problem Relation Age of Onset  . Diabetes Mother   . Hypertension Mother   . Cancer Paternal Uncle   . Breast cancer Paternal Uncle     40'S  . Breast cancer Paternal Aunt     59'S   Social History   Social History  . Marital status: Married    Spouse name: N/A  . Number of children: N/A  . Years of education: N/A   Social History Main Topics  . Smoking status: Current Every Day Smoker    Packs/day: 1.00    Types: Cigarettes  . Smokeless tobacco: Never Used  . Alcohol use Yes     Comment:  socially  . Drug use: No  . Sexual activity: Not Asked   Other Topics Concern  . None   Social History Narrative  . None   Past Surgical History:  Procedure Laterality Date  . BREAST BIOPSY Right 07/2011   invasive ductal carcinoma  . 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 RAd and chemo tx  . BREAST SURGERY    . mastectomy Right    partial with lymph node dissection  . PORT A CATH REVISION    . PORT-A-CATH REMOVAL  11-07-15   Dr Bary Castilla  . SPINE SURGERY  2008   Dr Joya Salm   Past Medical History:  Diagnosis Date  . BRCA negative   . Breast cancer ([place]) 2013   RT LUMPECTOMY with chemo and rad tx  . Cancer ([place])   . Personal history of chemotherapy 2013   for breast ca  . Radiation 2013   BREAST CA  . Status post chemotherapy 2013   BREAST CA   BP 139/88 (BP Location: Left Arm, Patient Position: Sitting, Cuff Size: Normal)   Pulse 82   SpO2 96%   Opioid Risk Score:   Fall Risk Score:  `1  Depression screen [facility] 2/9  Depression screen [facility] 2/9 03/13/2016 10/10/2015 08/15/2015 04/17/2015 02/19/2015 11/14/2014  Decreased Interest 0 0 0 0 0 0  Down, Depressed, Hopeless 0 0 0 0 0 0  PHQ - 2 Score 0 0 0 0 0 0  Altered sleeping - 2 - - 1 1  Tired, decreased energy - 1 - - 1 1  Change in appetite - 0 - - 0 0  Feeling bad or failure about yourself  - 0 - - 0 0  Trouble concentrating - 0 - - 0 0  Moving slowly or fidgety/restless - 0 - - 0 0  Suicidal thoughts - - - - 0 0  PHQ-9 Score - 3 - - 2 2  Difficult doing work/chores - Not difficult at all - - - -    Review of Systems  Constitutional: Negative.   HENT: Negative.   Eyes: Negative.   Respiratory: Negative.   Cardiovascular: Negative.   Gastrointestinal: Negative.   Endocrine: Negative.   Genitourinary: Negative.   Musculoskeletal: Positive for back pain and neck pain.  Skin: Negative.   Allergic/Immunologic: Negative.   Neurological: Negative.   Hematological:  Negative.   Psychiatric/Behavioral: Negative.   All other systems reviewed and are negative.      Objective:   Physical Exam  Constitutional: She is oriented to person, place, and time. She appears well-developed and well-nourished.  HENT:  Head: Normocephalic and atraumatic.  Neck: Normal range of motion. Neck supple.  Cardiovascular: Normal rate and regular rhythm.   Pulmonary/Chest: Effort normal and breath sounds normal.  Musculoskeletal:  Normal Muscle Bulk and Muscle Testing Reveals: Upper Extremities: Left: Full ROM and Muscle Strength 5/5 Right: Decreased ROM 45 Degrees and Muscle Strength 4/5 Right AC Joint Tenderness Thoracic Paraspinal Tenderness: T-1-T-3 Mainly Right Side Lower Extremities: Full ROM and Muscle Strength 5/5 Arises from Table with ease Narrow Based Gait   Neurological: She is alert and oriented to person, place, and time.  Skin: Skin is warm and dry.  Psychiatric: She has a normal mood and affect.  Nursing note and vitals reviewed.         Assessment & Plan:  1. Lumbar postlaminectomy syndrome status post L5-S1 fusion with chronic S1 radiculopathy. 12/01/2016 Continue: Hydrocodone 10/325 mg #120--one every 6 hours as needed for pain. We will continue the opioid monitoring program, this consists of regular clinic visits, examinations, urine drug screen, pill counts as well as use of [place] Controlled Substance reporting System. 2. 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 12/01/2016 3. Insomnia: Continue Ambien. 12/01/2016 4. Cervical Radiculopathy: RX: Lyrica 50 mg BID. Gabapentin and Pamelor  not effective.  20 minutes of face to face patient care time was spent during this visit. All questions were encouraged and answered.  F/U in 1 month.

## 2016-12-06 LAB — TOXASSURE SELECT,+ANTIDEPR,UR

## 2016-12-07 ENCOUNTER — Telehealth: Payer: Self-pay | Admitting: Registered Nurse

## 2016-12-07 MED ORDER — PREGABALIN 50 MG PO CAPS: 50.0000 mg | ORAL_CAPSULE | Freq: Two times a day (BID) | ORAL | 2 refills | Status: DC

## 2016-12-07 NOTE — Telephone Encounter (Signed)
Placed a call to Ms. Tullier, left her a message on hervoice mail.  The Lyrica has been approved. Lyrica 50 mg BID has been called into the pharmacy.

## 2016-12-08 ENCOUNTER — Telehealth: Payer: Self-pay | Admitting: *Deleted

## 2016-12-08 NOTE — Telephone Encounter (Signed)
Urine drug screen for this encounter is consistent for prescribed medication. Ambien per tox report "is not always detected even when used as directed."

## 2016-12-22 ENCOUNTER — Ambulatory Visit
Admission: EM | Admit: 2016-12-22 | Discharge: 2016-12-22 | Disposition: A | Discharge status: Home / Self Care | Payer: 59 | Attending: Family Medicine | Admitting: Family Medicine

## 2016-12-22 DIAGNOSIS — R05 Cough: Secondary | ICD-10-CM

## 2016-12-22 DIAGNOSIS — J22 Unspecified acute lower respiratory infection: Secondary | ICD-10-CM

## 2016-12-22 DIAGNOSIS — R0602 Shortness of breath: Secondary | ICD-10-CM

## 2016-12-22 DIAGNOSIS — J9801 Acute bronchospasm: Secondary | ICD-10-CM

## 2016-12-22 MED ORDER — BENZONATATE 200 MG PO CAPS: 200.0000 mg | ORAL_CAPSULE | Freq: Three times a day (TID) | ORAL | 0 refills | Status: DC | PRN

## 2016-12-22 MED ORDER — IPRATROPIUM-ALBUTEROL 0.5-2.5 (3) MG/3ML IN SOLN
3.0000 mL | Freq: Once | RESPIRATORY_TRACT | Status: AC
  Administered 2016-12-22: 3 mL via RESPIRATORY_TRACT

## 2016-12-22 MED ORDER — METHYLPREDNISOLONE SODIUM SUCC 125 MG IJ SOLR
125.0000 mg | Freq: Once | INTRAMUSCULAR | Status: AC
  Administered 2016-12-22: 125 mg via INTRAMUSCULAR

## 2016-12-22 MED ORDER — AZITHROMYCIN 250 MG PO TABS: ORAL_TABLET | ORAL | 0 refills | Status: DC

## 2016-12-22 MED ORDER — PREDNISONE 10 MG (21) PO TBPK: ORAL_TABLET | ORAL | 0 refills | Status: DC

## 2016-12-22 NOTE — ED Triage Notes (Signed)
Patient complains of shortness of breath, wheezing, tight feeling in chest. Patient states that she has been coughing. Patient reports that symptoms started Sunday night and have progressively worsening.

## 2016-12-22 NOTE — ED Provider Notes (Signed)
[facility]    CSN: [id number] Arrival date & time: 12/22/16  4174     History   Chief Complaint Chief Complaint  Patient presents with  . Shortness of Breath    HPI Elizabeth Torres is a 41 y.o. female.   Patient's here because of cough and congestion. She states the cough shortness of breath and wheezing started Sunday night. She's coughing up greenish material is gotten worse. She states about 6 months ago having something similar to this she had a chest x-ray according to her which was negative. Does smoke but she's not been smoking lately. She denies any history of asthma or COPD or diagnosis of respiratory problems past for this. Patient does have a history of breast biopsy breast lumpectomy and breast cancer she didn't mention to me was on the records. She does smoke about pack cigars a day mother has diabetes and hypertension. She is allergic to penicillin and sulfur Neurontin and Zofran.   The history is provided by the patient. No language interpreter was used.  Shortness of Breath  Severity:  Moderate Onset quality:  Sudden Timing:  Constant Progression:  Worsening Chronicity:  New Context: known allergens   Relieved by:  Nothing Worsened by:  Nothing Ineffective treatments:  None tried Associated symptoms: cough     Past Medical History:  Diagnosis Date  . BRCA negative   . Breast cancer ([place]) 2013   RT LUMPECTOMY with chemo and rad tx  . Cancer ([place])   . Personal history of chemotherapy 2013   for breast ca  . Radiation 2013   BREAST CA  . Status post chemotherapy 2013   BREAST CA    Patient Active Problem List   Diagnosis Date Noted  . Cervical myofascial pain syndrome 02/14/2016  . History of breast cancer 11/07/2015  . Postlaminectomy syndrome, lumbar region 04/12/2012  . Chemotherapy-induced neuropathy ([place]) 04/12/2012  . 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 ([place]) 08/07/2011    Past Surgical History:    Procedure Laterality Date  . BREAST BIOPSY Right 07/2011   invasive ductal carcinoma  . 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 RAd and chemo tx  . BREAST SURGERY    . mastectomy Right    partial with lymph node dissection  . PORT A CATH REVISION    . PORT-A-CATH REMOVAL  11-07-15   Dr Bary Castilla  . SPINE SURGERY  2008   Dr Joya Salm    OB History    No data available       Home Medications    Prior to Admission medications   Medication Sig Start Date End Date Taking? Authorizing Provider  HYDROcodone-acetaminophen (NORCO) 10-325 MG tablet TAKE 1 TABLET BY MOUTH EVERY 6 HOURS AS NEEDED FOR PAIN 12/01/16  Yes Bayard Hugger, NP  ibuprofen (ADVIL,MOTRIN) 800 MG tablet Take 800 mg by mouth every 6 (six) hours as needed (pain).  04/19/13  Yes Prueter, Santiago Glad, PA-C  nortriptyline (PAMELOR) 10 MG capsule Take 2 capsules (20 mg total) by mouth at bedtime. 12/01/16  Yes Bayard Hugger, NP  pregabalin (LYRICA) 50 MG capsule Take 1 capsule (50 mg total) by mouth 2 (two) times daily. 12/07/16  Yes Bayard Hugger, NP  zolpidem (AMBIEN) 5 MG tablet Take 1 tablet (5 mg total) by mouth at bedtime as needed. 11/06/16  Yes Bayard Hugger, NP  azithromycin (ZITHROMAX Z-PAK) 250 MG tablet Take 2 tablets first  day and then 1 po a day for 4 days 12/22/16   Frederich Cha, MD  benzonatate (TESSALON) 200 MG capsule Take 1 capsule (200 mg total) by mouth 3 (three) times daily as needed. 12/22/16   Frederich Cha, MD  predniSONE (STERAPRED UNI-PAK 21 TAB) 10 MG (21) TBPK tablet Sig 6 tablet day 1, 5 tablets day 2, 4 tablets day 3,,3tablets day 4, 2 tablets day 5, 1 tablet day 6 take all tablets orally 12/22/16   Frederich Cha, MD    Family History Family History  Problem Relation Age of Onset  . Diabetes Mother   . Hypertension Mother   . Cancer Paternal Uncle   . Breast cancer Paternal Uncle        40'S  . Breast cancer Paternal Aunt        7'S    Social  History Social History  Substance Use Topics  . Smoking status: Current Every Day Smoker    Packs/day: 1.00    Types: Cigarettes  . Smokeless tobacco: Never Used  . Alcohol use Yes     Comment: socially     Allergies   Penicillins; Sulfa antibiotics; Neurontin [gabapentin]; Ondansetron; and Zofran [ondansetron hcl]   Review of Systems Review of Systems  Respiratory: Positive for cough and shortness of breath.   All other systems reviewed and are negative.    Physical Exam Triage Vital Signs ED Triage Vitals  Enc Vitals Group     BP 12/22/16 0935 112/83     Pulse Rate 12/22/16 0935 (!) 102     Resp 12/22/16 0935 14     Temp 12/22/16 0935 97.6 F (36.4 C)     Temp Source 12/22/16 0935 Oral     SpO2 12/22/16 0935 98 %     Weight 12/22/16 0933 160 lb (72.6 kg)     Height 12/22/16 0933 '5\' 3"'  (1.6 m)     Head Circumference --      Peak Flow --      Pain Score 12/22/16 0933 6     Pain Loc --      Pain Edu? --      Excl. in [place]? --    No data found.   Updated Vital Signs BP 112/83 (BP Location: Left Arm)   Pulse 88   Temp 97.6 F (36.4 C) (Oral)   Resp 18   Ht '5\' 3"'  (1.6 m)   Wt 160 lb (72.6 kg)   LMP 12/08/2016   SpO2 99%   BMI 28.34 kg/m   Visual Acuity Right Eye Distance:   Left Eye Distance:   Bilateral Distance:    Right Eye Near:   Left Eye Near:    Bilateral Near:     Physical Exam  Constitutional: She is oriented to person, place, and time. She appears well-developed and well-nourished. No distress.  HENT:  Head: Normocephalic and atraumatic.  Right Ear: External ear normal.  Left Ear: External ear normal.  Eyes: Pupils are equal, round, and reactive to light.  Neck: Normal range of motion. Neck supple.  Cardiovascular: Regular rhythm.  Tachycardia present.   Pulmonary/Chest: She has wheezes.  Musculoskeletal: Normal range of motion. She exhibits no edema or deformity.  Neurological: She is alert and oriented to person, place, and time.   Skin: Skin is warm. She is not diaphoretic.  Psychiatric: She has a normal mood and affect.  Vitals reviewed.    UC Treatments / Results  Labs (all labs ordered are listed, but  only abnormal results are displayed) Labs Reviewed - No data to display  EKG  EKG Interpretation None       Radiology No results found.  Procedures Procedures (including critical care time)  Medications Ordered in UC Medications  ipratropium-albuterol (DUONEB) 0.5-2.5 (3) MG/3ML nebulizer solution 3 mL (3 mLs Nebulization Given 12/22/16 1008)  methylPREDNISolone sodium succinate (SOLU-MEDROL) 125 mg/2 mL injection 125 mg (125 mg Intramuscular Given 12/22/16 1010)     Initial Impression / Assessment and Plan / UC Course  I have reviewed the triage vital signs and the nursing notes.  Pertinent labs & imaging results that were available during my care of the patient were reviewed by me and considered in my medical decision making (see chart for details).  We'll give patient a DuoNeb treatment on 25 mg Solu-Medrol will place on Z-Pak 6 days of prednisone taper and albuterol aerosol which she still has and Tessalon Perles for cough follow PCP in one week if needed work note given for today and tomorrow as well.    Final Clinical Impressions(s) / UC Diagnoses   Final diagnoses:  Acute lower respiratory infection  Bronchospasm    New Prescriptions New Prescriptions   AZITHROMYCIN (ZITHROMAX Z-PAK) 250 MG TABLET    Take 2 tablets first day and then 1 po a day for 4 days   BENZONATATE (TESSALON) 200 MG CAPSULE    Take 1 capsule (200 mg total) by mouth 3 (three) times daily as needed.   PREDNISONE (STERAPRED UNI-PAK 21 TAB) 10 MG (21) TBPK TABLET    Sig 6 tablet day 1, 5 tablets day 2, 4 tablets day 3,,3tablets day 4, 2 tablets day 5, 1 tablet day 6 take all tablets orally     Note: This dictation was prepared with Dragon dictation along with smaller phrase technology. Any transcriptional errors  that result from this process are unintentional.   Frederich Cha, MD 12/22/16 1053

## 2017-01-01 ENCOUNTER — Encounter: Payer: Self-pay | Admitting: Registered Nurse

## 2017-01-01 ENCOUNTER — Encounter: Payer: 59 | Attending: Physical Medicine and Rehabilitation | Admitting: Registered Nurse

## 2017-01-01 VITALS — BP 124/87 | HR 97

## 2017-01-01 DIAGNOSIS — F17210 Nicotine dependence, cigarettes, uncomplicated: Secondary | ICD-10-CM | POA: Diagnosis not present

## 2017-01-01 DIAGNOSIS — Z5181 Encounter for therapeutic drug level monitoring: Secondary | ICD-10-CM | POA: Diagnosis not present

## 2017-01-01 DIAGNOSIS — T451X5S Adverse effect of antineoplastic and immunosuppressive drugs, sequela: Secondary | ICD-10-CM | POA: Diagnosis not present

## 2017-01-01 DIAGNOSIS — M545 Low back pain: Secondary | ICD-10-CM | POA: Diagnosis not present

## 2017-01-01 DIAGNOSIS — M7918 Myalgia, other site: Secondary | ICD-10-CM

## 2017-01-01 DIAGNOSIS — M79604 Pain in right leg: Secondary | ICD-10-CM | POA: Insufficient documentation

## 2017-01-01 DIAGNOSIS — G894 Chronic pain syndrome: Secondary | ICD-10-CM

## 2017-01-01 DIAGNOSIS — M791 Myalgia: Secondary | ICD-10-CM

## 2017-01-01 DIAGNOSIS — G620 Drug-induced polyneuropathy: Secondary | ICD-10-CM | POA: Diagnosis not present

## 2017-01-01 DIAGNOSIS — Z79899 Other long term (current) drug therapy: Secondary | ICD-10-CM

## 2017-01-01 DIAGNOSIS — M79605 Pain in left leg: Secondary | ICD-10-CM | POA: Diagnosis not present

## 2017-01-01 DIAGNOSIS — G8929 Other chronic pain: Secondary | ICD-10-CM | POA: Insufficient documentation

## 2017-01-01 DIAGNOSIS — M961 Postlaminectomy syndrome, not elsewhere classified: Secondary | ICD-10-CM | POA: Insufficient documentation

## 2017-01-01 DIAGNOSIS — M5416 Radiculopathy, lumbar region: Secondary | ICD-10-CM

## 2017-01-01 DIAGNOSIS — Z9221 Personal history of antineoplastic chemotherapy: Secondary | ICD-10-CM | POA: Diagnosis not present

## 2017-01-01 DIAGNOSIS — T451X5A Adverse effect of antineoplastic and immunosuppressive drugs, initial encounter: Secondary | ICD-10-CM

## 2017-01-01 DIAGNOSIS — Z760 Encounter for issue of repeat prescription: Secondary | ICD-10-CM | POA: Diagnosis not present

## 2017-01-01 MED ORDER — HYDROCODONE-ACETAMINOPHEN 10-325 MG PO TABS: ORAL_TABLET | ORAL | 0 refills | Status: DC

## 2017-01-01 NOTE — Progress Notes (Signed)
Subjective:    Patient ID: Elizabeth Torres, female    DOB: 1975/10/11, 41 y.o.   MRN: [id number]  HPI: [name] is a 41year old female who returns for follow up appointmentfor chronic pain and medication refill. She states her pain is located in her neck, lower back radiating into herbilaterallower extremities. She rates her pain 6. Her current exercise regime is walking.   Last UDS was on 12/01/2016 it was consistent.  She's working 40 hours a week.  Pain Inventory Average Pain 4 Pain Right Now 4 My pain is constant, dull and aching  In the last 24 hours, has pain interfered with the following? General activity 4 Relation with others 4 Enjoyment of life 4 What TIME of day is your pain at its worst? evening Sleep (in general) Fair  Pain is worse with: sitting, inactivity and standing Pain improves with: rest and medication Relief from Meds: 5  Mobility Do you have any goals in this area?  no  Function Do you have any goals in this area?  no  Neuro/Psych No problems in this area  Prior Studies Any changes since last visit?  no  Physicians involved in your care Any changes since last visit?  no   Family History  Problem Relation Age of Onset  . Diabetes Mother   . Hypertension Mother   . Cancer Paternal Uncle   . Breast cancer Paternal Uncle        40'S  . Breast cancer Paternal Aunt        64'S   Social History   Social History  . Marital status: Married    Spouse name: N/A  . Number of children: N/A  . Years of education: N/A   Social History Main Topics  . Smoking status: Current Every Day Smoker    Packs/day: 1.00    Types: Cigarettes  . Smokeless tobacco: Never Used  . Alcohol use Yes     Comment: socially  . Drug use: No  . Sexual activity: Not Asked   Other Topics Concern  . None   Social History Narrative  . None   Past Surgical History:  Procedure Laterality Date  . BREAST BIOPSY Right 07/2011   invasive ductal  carcinoma  . 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 RAd and chemo tx  . BREAST SURGERY    . mastectomy Right    partial with lymph node dissection  . PORT A CATH REVISION    . PORT-A-CATH REMOVAL  11-07-15   Dr Bary Castilla  . SPINE SURGERY  2008   Dr Joya Salm   Past Medical History:  Diagnosis Date  . BRCA negative   . Breast cancer ([place]) 2013   RT LUMPECTOMY with chemo and rad tx  . Cancer ([place])   . Personal history of chemotherapy 2013   for breast ca  . Radiation 2013   BREAST CA  . Status post chemotherapy 2013   BREAST CA   LMP 12/08/2016   Opioid Risk Score:   Fall Risk Score:  `1  Depression screen PHQ 2/9  Depression screen [facility] 2/9 03/13/2016 10/10/2015 08/15/2015 04/17/2015 02/19/2015 11/14/2014  Decreased Interest 0 0 0 0 0 0  Down, Depressed, Hopeless 0 0 0 0 0 0  PHQ - 2 Score 0 0 0 0 0 0  Altered sleeping - 2 - - 1 1  Tired, decreased energy - 1 - - 1 1  Change  in appetite - 0 - - 0 0  Feeling bad or failure about yourself  - 0 - - 0 0  Trouble concentrating - 0 - - 0 0  Moving slowly or fidgety/restless - 0 - - 0 0  Suicidal thoughts - - - - 0 0  PHQ-9 Score - 3 - - 2 2  Difficult doing work/chores - Not difficult at all - - - -    Review of Systems  Constitutional: Negative.   HENT: Negative.   Eyes: Negative.   Cardiovascular: Negative.   Gastrointestinal: Negative.   Endocrine: Negative.   Genitourinary: Negative.   Musculoskeletal: Positive for arthralgias, back pain, myalgias and neck pain.  Skin: Negative.   Allergic/Immunologic: Negative.   Neurological: Negative.   Hematological: Negative.   Psychiatric/Behavioral: Negative.   All other systems reviewed and are negative.      Objective:   Physical Exam  Constitutional: She is oriented to person, place, and time. She appears well-developed and well-nourished.  HENT:  Head: Normocephalic and atraumatic.  Neck: Normal range of motion. Neck supple.    Cervical Paraspinal Tenderness: C-5-C-6  Cardiovascular: Normal rate and regular rhythm.   Pulmonary/Chest: Effort normal and breath sounds normal.  Musculoskeletal:  Normal Muscle Bulk and Muscle Testing Reveals: Upper Extremities: Full ROM and Muscle Strength 5/5 Thoracic Paraspinal Tenderness: T-1-T-3 Lower Extremities: Full ROM and Muscle Strength 5/5 Arises from chair with ease Narrow Based Gait  Neurological: She is alert and oriented to person, place, and time.  Skin: Skin is warm and dry.  Psychiatric: She has a normal mood and affect.  Nursing note and vitals reviewed.         Assessment & Plan:  1. Lumbar postlaminectomy syndrome status post L5-S1 fusion with chronic S1 radiculopathy. 01/01/2017 Continue: Hydrocodone 10/325 mg #120--one every 6 hours as needed for pain. We will continue the opioid monitoring program, this consists of regular clinic visits, examinations, urine drug screen, pill counts as well as use of [place] Controlled Substance reporting System. 2. 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 01/01/2017 3. Insomnia: Continue Ambien. 01/01/2017 4. Cervical Radiculopathy: Continue: Lyrica 50 mg BID/ Awaiting Tier Exception approval. Continue Pamelor. 01/01/2017  15  minutes of face to face patient care time was spent during this visit. All questions were encouraged and answered.   F/U in 1 month.

## 2017-01-27 ENCOUNTER — Other Ambulatory Visit: Payer: Self-pay | Admitting: *Deleted

## 2017-01-27 MED ORDER — ZOLPIDEM TARTRATE 5 MG PO TABS: 5.0000 mg | ORAL_TABLET | Freq: Every evening | ORAL | 2 refills | Status: DC | PRN

## 2017-02-02 ENCOUNTER — Encounter: Payer: 59 | Attending: Physical Medicine and Rehabilitation | Admitting: Registered Nurse

## 2017-02-02 ENCOUNTER — Encounter: Payer: Self-pay | Admitting: Registered Nurse

## 2017-02-02 VITALS — BP 121/82 | HR 96 | Resp 14

## 2017-02-02 DIAGNOSIS — Z9221 Personal history of antineoplastic chemotherapy: Secondary | ICD-10-CM | POA: Insufficient documentation

## 2017-02-02 DIAGNOSIS — T451X5S Adverse effect of antineoplastic and immunosuppressive drugs, sequela: Secondary | ICD-10-CM | POA: Diagnosis not present

## 2017-02-02 DIAGNOSIS — G620 Drug-induced polyneuropathy: Secondary | ICD-10-CM

## 2017-02-02 DIAGNOSIS — F17210 Nicotine dependence, cigarettes, uncomplicated: Secondary | ICD-10-CM | POA: Insufficient documentation

## 2017-02-02 DIAGNOSIS — M79604 Pain in right leg: Secondary | ICD-10-CM | POA: Diagnosis not present

## 2017-02-02 DIAGNOSIS — Z5181 Encounter for therapeutic drug level monitoring: Secondary | ICD-10-CM

## 2017-02-02 DIAGNOSIS — M5416 Radiculopathy, lumbar region: Secondary | ICD-10-CM | POA: Diagnosis not present

## 2017-02-02 DIAGNOSIS — M79605 Pain in left leg: Secondary | ICD-10-CM | POA: Diagnosis not present

## 2017-02-02 DIAGNOSIS — G8929 Other chronic pain: Secondary | ICD-10-CM | POA: Diagnosis not present

## 2017-02-02 DIAGNOSIS — M7918 Myalgia, other site: Secondary | ICD-10-CM

## 2017-02-02 DIAGNOSIS — M791 Myalgia: Secondary | ICD-10-CM | POA: Diagnosis not present

## 2017-02-02 DIAGNOSIS — M545 Low back pain: Secondary | ICD-10-CM | POA: Insufficient documentation

## 2017-02-02 DIAGNOSIS — M961 Postlaminectomy syndrome, not elsewhere classified: Secondary | ICD-10-CM | POA: Diagnosis not present

## 2017-02-02 DIAGNOSIS — Z79899 Other long term (current) drug therapy: Secondary | ICD-10-CM | POA: Diagnosis not present

## 2017-02-02 DIAGNOSIS — G894 Chronic pain syndrome: Secondary | ICD-10-CM | POA: Diagnosis not present

## 2017-02-02 DIAGNOSIS — T451X5A Adverse effect of antineoplastic and immunosuppressive drugs, initial encounter: Secondary | ICD-10-CM

## 2017-02-02 DIAGNOSIS — Z760 Encounter for issue of repeat prescription: Secondary | ICD-10-CM | POA: Diagnosis present

## 2017-02-02 MED ORDER — HYDROCODONE-ACETAMINOPHEN 10-325 MG PO TABS: ORAL_TABLET | ORAL | 0 refills | Status: DC

## 2017-02-02 NOTE — Progress Notes (Signed)
Subjective:    Patient ID: Elizabeth Torres, female    DOB: 02-Sep-1975, 41 y.o.   MRN: [id number]  HPI:  [name] is a 41year old female who returns for follow up appointmentfor chronic pain and medication refill. She states her pain is located in her neck, lower back radiating into herbilaterallower extremities. She rates her pain 3. Her current exercise regime is walking.   Last UDS was on 12/01/2016 it was consistent.  She's working 40 hours a week.   Pain Inventory Average Pain 4 Pain Right Now 3 My pain is constant, dull and aching  In the last 24 hours, has pain interfered with the following? General activity 3 Relation with others 3 Enjoyment of life 2 What TIME of day is your pain at its worst? evening Sleep (in general) Fair  Pain is worse with: sitting, inactivity and standing Pain improves with: rest and medication Relief from Meds: 5  Mobility Do you have any goals in this area?  no  Function Do you have any goals in this area?  no  Neuro/Psych No problems in this area  Prior Studies Any changes since last visit?  no  Physicians involved in your care Any changes since last visit?  no   Family History  Problem Relation Age of Onset  . Diabetes Mother   . Hypertension Mother   . Cancer Paternal Uncle   . Breast cancer Paternal Uncle        40'S  . Breast cancer Paternal Aunt        7'S   Social History   Social History  . Marital status: Married    Spouse name: N/A  . Number of children: N/A  . Years of education: N/A   Social History Main Topics  . Smoking status: Current Every Day Smoker    Packs/day: 1.00    Types: Cigarettes  . Smokeless tobacco: Never Used  . Alcohol use Yes     Comment: socially  . Drug use: No  . Sexual activity: Not on file   Other Topics Concern  . Not on file   Social History Narrative  . No narrative on file   Past Surgical History:  Procedure Laterality Date  . BREAST BIOPSY Right  07/2011   invasive ductal carcinoma  . 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 RAd and chemo tx  . BREAST SURGERY    . mastectomy Right    partial with lymph node dissection  . PORT A CATH REVISION    . PORT-A-CATH REMOVAL  11-07-15   Dr Bary Castilla  . SPINE SURGERY  2008   Dr Joya Salm   Past Medical History:  Diagnosis Date  . BRCA negative   . Breast cancer ([place]) 2013   RT LUMPECTOMY with chemo and rad tx  . Cancer ([place])   . Personal history of chemotherapy 2013   for breast ca  . Radiation 2013   BREAST CA  . Status post chemotherapy 2013   BREAST CA   There were no vitals taken for this visit.  Opioid Risk Score:   Fall Risk Score:  `1  Depression screen PHQ 2/9  Depression screen [facility] 2/9 03/13/2016 10/10/2015 08/15/2015 04/17/2015 02/19/2015 11/14/2014  Decreased Interest 0 0 0 0 0 0  Down, Depressed, Hopeless 0 0 0 0 0 0  PHQ - 2 Score 0 0 0 0 0 0  Altered sleeping - 2 - - 1  1  Tired, decreased energy - 1 - - 1 1  Change in appetite - 0 - - 0 0  Feeling bad or failure about yourself  - 0 - - 0 0  Trouble concentrating - 0 - - 0 0  Moving slowly or fidgety/restless - 0 - - 0 0  Suicidal thoughts - - - - 0 0  PHQ-9 Score - 3 - - 2 2  Difficult doing work/chores - Not difficult at all - - - -      Review of Systems  Constitutional: Negative.   HENT: Negative.   Eyes: Negative.   Respiratory: Negative.   Cardiovascular: Negative.   Gastrointestinal: Negative.   Endocrine: Negative.   Genitourinary: Negative.   Musculoskeletal: Negative.   Skin: Negative.   Allergic/Immunologic: Negative.   Neurological: Negative.   Hematological: Negative.   Psychiatric/Behavioral: Negative.   All other systems reviewed and are negative.      Objective:   Physical Exam  Constitutional: She is oriented to person, place, and time. She appears well-developed and well-nourished.  HENT:  Head: Normocephalic and atraumatic.  Neck:  Normal range of motion. Neck supple.  Cardiovascular: Normal rate and regular rhythm.   Pulmonary/Chest: Effort normal and breath sounds normal.  Musculoskeletal:  Normal Muscle Bulk and Muscle Testing reveals: Upper Extremities: Full ROM and Muscle Strength 5/5 No Lumbar Paraspinal tenderness Lower Extremities: Full ROM and Muscle Strength 5/5 Arises from chair with ease Narrow Based gait  Neurological: She is alert and oriented to person, place, and time.  Skin: Skin is warm and dry.  Psychiatric: She has a normal mood and affect.  Nursing note and vitals reviewed.         Assessment & Plan:  1. Lumbar postlaminectomy syndrome status post L5-S1 fusion with chronic S1 radiculopathy. 02/02/2017 Continue: Hydrocodone 10/325 mg #120--one every 6 hours as needed for pain. We will continue the opioid monitoring program, this consists of regular clinic visits, examinations, urine drug screen, pill counts as well as use of [place] Controlled Substance reporting System. 2. 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 02/02/2017 3. Insomnia: Continue Ambien. 02/02/2017 4. Cervical Radiculopathy:. Continue Pamelor. 02/02/2017  15 minutes of face to face patient care time was spent during this visit. All questions were encouraged and answered.  F/U in 1 month.

## 2017-03-03 ENCOUNTER — Encounter: Payer: 59 | Attending: Physical Medicine and Rehabilitation | Admitting: Registered Nurse

## 2017-03-03 ENCOUNTER — Encounter: Payer: Self-pay | Admitting: Registered Nurse

## 2017-03-03 VITALS — BP 127/91 | HR 98

## 2017-03-03 DIAGNOSIS — T451X5S Adverse effect of antineoplastic and immunosuppressive drugs, sequela: Secondary | ICD-10-CM | POA: Diagnosis not present

## 2017-03-03 DIAGNOSIS — Z760 Encounter for issue of repeat prescription: Secondary | ICD-10-CM | POA: Diagnosis present

## 2017-03-03 DIAGNOSIS — G620 Drug-induced polyneuropathy: Secondary | ICD-10-CM | POA: Diagnosis not present

## 2017-03-03 DIAGNOSIS — F17210 Nicotine dependence, cigarettes, uncomplicated: Secondary | ICD-10-CM | POA: Diagnosis not present

## 2017-03-03 DIAGNOSIS — M545 Low back pain: Secondary | ICD-10-CM | POA: Diagnosis not present

## 2017-03-03 DIAGNOSIS — Z79899 Other long term (current) drug therapy: Secondary | ICD-10-CM | POA: Diagnosis not present

## 2017-03-03 DIAGNOSIS — G8929 Other chronic pain: Secondary | ICD-10-CM | POA: Insufficient documentation

## 2017-03-03 DIAGNOSIS — G894 Chronic pain syndrome: Secondary | ICD-10-CM | POA: Diagnosis not present

## 2017-03-03 DIAGNOSIS — Z9221 Personal history of antineoplastic chemotherapy: Secondary | ICD-10-CM | POA: Diagnosis not present

## 2017-03-03 DIAGNOSIS — T451X5A Adverse effect of antineoplastic and immunosuppressive drugs, initial encounter: Secondary | ICD-10-CM | POA: Diagnosis not present

## 2017-03-03 DIAGNOSIS — M79604 Pain in right leg: Secondary | ICD-10-CM | POA: Diagnosis not present

## 2017-03-03 DIAGNOSIS — M5416 Radiculopathy, lumbar region: Secondary | ICD-10-CM

## 2017-03-03 DIAGNOSIS — Z5181 Encounter for therapeutic drug level monitoring: Secondary | ICD-10-CM | POA: Diagnosis not present

## 2017-03-03 DIAGNOSIS — M961 Postlaminectomy syndrome, not elsewhere classified: Secondary | ICD-10-CM | POA: Diagnosis not present

## 2017-03-03 DIAGNOSIS — M79605 Pain in left leg: Secondary | ICD-10-CM | POA: Insufficient documentation

## 2017-03-03 MED ORDER — HYDROCODONE-ACETAMINOPHEN 10-325 MG PO TABS: ORAL_TABLET | ORAL | 0 refills | Status: DC

## 2017-03-03 NOTE — Progress Notes (Signed)
Subjective:    Patient ID: Elizabeth Torres, female    DOB: 01-28-1976, 41 y.o.   MRN: [id number]  HPI: [name] is a 41year old female who returns for follow up appointmentfor chronic pain and medication refill. She states her pain is located in her lower back radiating into herbilaterallower extremities. She rates her pain 3. Her current exercise regime is walking.   Last UDS was on 12/01/2016 it was consistent.  She's working 40 hours a week.  Pain Inventory Average Pain 4 Pain Right Now 3 My pain is constant, dull and aching  In the last 24 hours, has pain interfered with the following? General activity 3 Relation with others 3 Enjoyment of life 2 What TIME of day is your pain at its worst? evening Sleep (in general) Fair  Pain is worse with: sitting, inactivity and standing Pain improves with: rest and medication Relief from Meds: 5  Mobility walk without assistance  Function Do you have any goals in this area?  no  Neuro/Psych No problems in this area  Prior Studies Any changes since last visit?  no  Physicians involved in your care Any changes since last visit?  no   Family History  Problem Relation Age of Onset  . Diabetes Mother   . Hypertension Mother   . Cancer Paternal Uncle   . Breast cancer Paternal Uncle        40'S  . Breast cancer Paternal Aunt        64'S   Social History   Social History  . Marital status: Married    Spouse name: N/A  . Number of children: N/A  . Years of education: N/A   Social History Main Topics  . Smoking status: Current Every Day Smoker    Packs/day: 1.00    Types: Cigarettes  . Smokeless tobacco: Never Used  . Alcohol use Yes     Comment: socially  . Drug use: No  . Sexual activity: Not Asked   Other Topics Concern  . None   Social History Narrative  . None   Past Surgical History:  Procedure Laterality Date  . BREAST BIOPSY Right 07/2011   invasive ductal carcinoma  . BREAST  LUMPECTOMY Right 08/2011   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 RAd and chemo tx  . BREAST SURGERY    . mastectomy Right    partial with lymph node dissection  . PORT A CATH REVISION    . PORT-A-CATH REMOVAL  11-07-15   Dr Bary Castilla  . SPINE SURGERY  2008   Dr Joya Salm   Past Medical History:  Diagnosis Date  . BRCA negative   . Breast cancer ([place]) 2013   RT LUMPECTOMY with chemo and rad tx  . Cancer ([place])   . Personal history of chemotherapy 2013   for breast ca  . Radiation 2013   BREAST CA  . Status post chemotherapy 2013   BREAST CA   BP (!) 127/91 (BP Location: Left Arm, Patient Position: Sitting, Cuff Size: Normal)   Pulse 98   SpO2 94%   Opioid Risk Score:  1 Fall Risk Score:  `1  Depression screen PHQ 2/9  Depression screen [facility] 2/9 03/03/2017 03/13/2016 10/10/2015 08/15/2015 04/17/2015 02/19/2015 11/14/2014  Decreased Interest 0 0 0 0 0 0 0  Down, Depressed, Hopeless 0 0 0 0 0 0 0  PHQ - 2 Score 0 0 0 0 0 0 0  Altered sleeping - - 2 - -  1 1  Tired, decreased energy - - 1 - - 1 1  Change in appetite - - 0 - - 0 0  Feeling bad or failure about yourself  - - 0 - - 0 0  Trouble concentrating - - 0 - - 0 0  Moving slowly or fidgety/restless - - 0 - - 0 0  Suicidal thoughts - - - - - 0 0  PHQ-9 Score - - 3 - - 2 2  Difficult doing work/chores - - Not difficult at all - - - -   Review of Systems  Constitutional: Negative.   HENT: Negative.   Eyes: Negative.   Respiratory: Negative.   Cardiovascular: Negative.   Gastrointestinal: Negative.   Endocrine: Negative.   Genitourinary: Negative.   Musculoskeletal: Negative.   Skin: Negative.   Allergic/Immunologic: Negative.   Neurological: Negative.   Hematological: Negative.   Psychiatric/Behavioral: Negative.   All other systems reviewed and are negative.      Objective:   Physical Exam  Constitutional: She is oriented to person, place, and time. She appears well-developed and well-nourished.  HENT:    Head: Normocephalic and atraumatic.  Neck: Normal range of motion. Neck supple.  Cardiovascular: Normal rate and regular rhythm.   Pulmonary/Chest: Effort normal and breath sounds normal.  Musculoskeletal:  Normal Muscle Bulk and Muscle Testing Reveals: Upper Extremities: Full ROM and Muscle Strength 5/5 Back without spinal tenderness noted Lower Extremities: Full ROM and Muscle Strength 5/5 Arises from chair with ease Narrow Based Gait  Neurological: She is alert and oriented to person, place, and time.  Skin: Skin is warm and dry.  Psychiatric: She has a normal mood and affect.  Nursing note and vitals reviewed.         Assessment & Plan:  1. Lumbar postlaminectomy syndrome status post L5-S1 fusion with chronic S1 radiculopathy. 02/02/2017 Continue: Hydrocodone 10/325 mg #120--one every 6 hours as needed for pain. We will continue the opioid monitoring program, this consists of regular clinic visits, examinations, urine drug screen, pill counts as well as use of [place] Controlled Substance reporting System. 2. 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 02/02/2017 3. Insomnia: Continue Ambien. 02/02/2017 4. Cervical Radiculopathy:. ContinuePamelor. 02/02/2017  30 minutes of face to face patient care time was spent during this visit. All questions were encouraged and answered.  F/U in 1 month.

## 2017-03-12 ENCOUNTER — Other Ambulatory Visit: Payer: Self-pay | Admitting: Registered Nurse

## 2017-04-06 ENCOUNTER — Encounter: Payer: Self-pay | Admitting: Registered Nurse

## 2017-04-06 ENCOUNTER — Telehealth: Payer: Self-pay | Admitting: Registered Nurse

## 2017-04-06 ENCOUNTER — Encounter: Payer: 59 | Attending: Physical Medicine and Rehabilitation | Admitting: Registered Nurse

## 2017-04-06 VITALS — BP 123/84 | HR 100

## 2017-04-06 DIAGNOSIS — G894 Chronic pain syndrome: Secondary | ICD-10-CM | POA: Diagnosis not present

## 2017-04-06 DIAGNOSIS — T451X5A Adverse effect of antineoplastic and immunosuppressive drugs, initial encounter: Secondary | ICD-10-CM

## 2017-04-06 DIAGNOSIS — M5416 Radiculopathy, lumbar region: Secondary | ICD-10-CM | POA: Diagnosis not present

## 2017-04-06 DIAGNOSIS — M961 Postlaminectomy syndrome, not elsewhere classified: Secondary | ICD-10-CM

## 2017-04-06 DIAGNOSIS — Z79899 Other long term (current) drug therapy: Secondary | ICD-10-CM

## 2017-04-06 DIAGNOSIS — T451X5S Adverse effect of antineoplastic and immunosuppressive drugs, sequela: Secondary | ICD-10-CM | POA: Diagnosis not present

## 2017-04-06 DIAGNOSIS — M545 Low back pain: Secondary | ICD-10-CM | POA: Insufficient documentation

## 2017-04-06 DIAGNOSIS — M79605 Pain in left leg: Secondary | ICD-10-CM | POA: Insufficient documentation

## 2017-04-06 DIAGNOSIS — M79604 Pain in right leg: Secondary | ICD-10-CM | POA: Diagnosis not present

## 2017-04-06 DIAGNOSIS — G620 Drug-induced polyneuropathy: Secondary | ICD-10-CM | POA: Insufficient documentation

## 2017-04-06 DIAGNOSIS — G8929 Other chronic pain: Secondary | ICD-10-CM | POA: Insufficient documentation

## 2017-04-06 DIAGNOSIS — Z5181 Encounter for therapeutic drug level monitoring: Secondary | ICD-10-CM | POA: Diagnosis not present

## 2017-04-06 DIAGNOSIS — Z9221 Personal history of antineoplastic chemotherapy: Secondary | ICD-10-CM | POA: Diagnosis not present

## 2017-04-06 DIAGNOSIS — F17210 Nicotine dependence, cigarettes, uncomplicated: Secondary | ICD-10-CM | POA: Insufficient documentation

## 2017-04-06 DIAGNOSIS — Z760 Encounter for issue of repeat prescription: Secondary | ICD-10-CM | POA: Insufficient documentation

## 2017-04-06 MED ORDER — ZOLPIDEM TARTRATE 5 MG PO TABS: 5.0000 mg | ORAL_TABLET | Freq: Every evening | ORAL | 2 refills | Status: DC | PRN

## 2017-04-06 MED ORDER — HYDROCODONE-ACETAMINOPHEN 10-325 MG PO TABS: ORAL_TABLET | ORAL | 0 refills | Status: DC

## 2017-04-06 NOTE — Telephone Encounter (Signed)
On 04/06/2017 the [place] was reviewed no conflict was seen on the [place] with multiple prescribers. Elizabeth Torres has a signed narcotic contract with our office. If there were any discrepancies this would have been reported to her physician.

## 2017-04-06 NOTE — Progress Notes (Signed)
Subjective:    Patient ID: Elizabeth Torres, female    DOB: [birth year], 41 y.o.   MRN: [id number]  HPI: Ms. NICK ARMEL is a 41year old female who returns for follow up appointmentfor chronic pain and medication refill. She states her pain is located in her lower back radiating into herbilaterallower extremities. She rates her pain 4. Her current exercise regime is walking.   Last UDS was on 12/01/2016 it was consistent.  She's working 40 hours a week.  Pain Inventory Average Pain 4 Pain Right Now 4 My pain is constant, dull and aching  In the last 24 hours, has pain interfered with the following? General activity 4 Relation with others 4 Enjoyment of life 4 What TIME of day is your pain at its worst? evening Sleep (in general) Fair  Pain is worse with: sitting, inactivity and standing Pain improves with: rest and medication Relief from Meds: 5  Mobility Do you have any goals in this area?  no  Function Do you have any goals in this area?  no  Neuro/Psych No problems in this area  Prior Studies Any changes since last visit?  no  Physicians involved in your care Any changes since last visit?  no   Family History  Problem Relation Age of Onset  . Diabetes Mother   . Hypertension Mother   . Cancer Paternal Uncle   . Breast cancer Paternal Uncle        40'S  . Breast cancer Paternal Aunt        23'S   Social History   Social History  . Marital status: Married    Spouse name: N/A  . Number of children: N/A  . Years of education: N/A   Social History Main Topics  . Smoking status: Current Every Day Smoker    Packs/day: 1.00    Types: Cigarettes  . Smokeless tobacco: Never Used  . Alcohol use Yes     Comment: socially  . Drug use: No  . Sexual activity: Not Asked   Other Topics Concern  . None   Social History Narrative  . None   Past Surgical History:  Procedure Laterality Date  . BREAST BIOPSY Right 07/2011   invasive ductal carcinoma    . 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 RAd and chemo tx  . BREAST SURGERY    . mastectomy Right    partial with lymph node dissection  . PORT A CATH REVISION    . PORT-A-CATH REMOVAL  11-07-15   Dr Bary Castilla  . SPINE SURGERY  2008   Dr Joya Salm   Past Medical History:  Diagnosis Date  . BRCA negative   . Breast cancer ([place]) 2013   RT LUMPECTOMY with chemo and rad tx  . Cancer ([place])   . Personal history of chemotherapy 2013   for breast ca  . Radiation 2013   BREAST CA  . Status post chemotherapy 2013   BREAST CA   BP 123/84   Pulse (!) 110   SpO2 95%   Opioid Risk Score:  1 Fall Risk Score:  `1  Depression screen PHQ 2/9  Depression screen [facility] 2/9 04/06/2017 03/03/2017 03/13/2016 10/10/2015 08/15/2015 04/17/2015 02/19/2015  Decreased Interest 0 0 0 0 0 0 0  Down, Depressed, Hopeless 0 0 0 0 0 0 0  PHQ - 2 Score 0 0 0 0 0 0 0  Altered sleeping - - - 2 - -  1  Tired, decreased energy - - - 1 - - 1  Change in appetite - - - 0 - - 0  Feeling bad or failure about yourself  - - - 0 - - 0  Trouble concentrating - - - 0 - - 0  Moving slowly or fidgety/restless - - - 0 - - 0  Suicidal thoughts - - - - - - 0  PHQ-9 Score - - - 3 - - 2  Difficult doing work/chores - - - Not difficult at all - - -    Review of Systems  Constitutional: Negative.   HENT: Negative.   Eyes: Negative.   Respiratory: Negative.   Cardiovascular: Negative.   Gastrointestinal: Negative.   Endocrine: Negative.   Genitourinary: Negative.   Musculoskeletal: Negative.   Skin: Negative.   Allergic/Immunologic: Negative.   Neurological: Negative.   Hematological: Negative.   Psychiatric/Behavioral: Negative.   All other systems reviewed and are negative.      Objective:   Physical Exam  Constitutional: She is oriented to person, place, and time. She appears well-developed and well-nourished.  HENT:  Head: Normocephalic and atraumatic.  Neck: Normal range  of motion. Neck supple.  Cardiovascular: Normal rate and regular rhythm.   Pulmonary/Chest: Effort normal and breath sounds normal.  Musculoskeletal:  Normal Muscle Bulk and Muscle Testing Reveals: Upper Extremities: Full ROM and Muscle Strength 5/5 Back without spinal tenderness Lower Extremities: Full ROM and Muscle Strength 5/5 Arises from chair with ease Narrow Based Gait  Neurological: She is alert and oriented to person, place, and time.  Skin: Skin is warm and dry.  Psychiatric: She has a normal mood and affect.  Nursing note and vitals reviewed.         Assessment & Plan:  1. Lumbar postlaminectomy syndrome status post L5-S1 fusion with chronic S1 radiculopathy. 04/06/2017 Continue: Hydrocodone 10/325 mg #120--one every 6 hours as needed for pain. We will continue the opioid monitoring program, this consists of regular clinic visits, examinations, urine drug screen, pill counts as well as use of [place] Controlled Substance reporting System. 2. 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 04/06/2017 3. Insomnia: Continue Ambien. 04/06/2017 4. Cervical Radiculopathy:. ContinuePamelor. 04/06/2017  20 minutes of face to face patient care time was spent during this visit. All questions were encouraged and answered.  F/U in 1 month.

## 2017-05-04 ENCOUNTER — Ambulatory Visit (HOSPITAL_BASED_OUTPATIENT_CLINIC_OR_DEPARTMENT_OTHER): Payer: 59 | Admitting: Physical Medicine & Rehabilitation

## 2017-05-04 ENCOUNTER — Encounter: Payer: Self-pay | Admitting: Physical Medicine & Rehabilitation

## 2017-05-04 ENCOUNTER — Encounter: Payer: 59 | Attending: Physical Medicine and Rehabilitation

## 2017-05-04 VITALS — BP 130/88 | HR 95

## 2017-05-04 DIAGNOSIS — M79604 Pain in right leg: Secondary | ICD-10-CM | POA: Diagnosis not present

## 2017-05-04 DIAGNOSIS — T451X5S Adverse effect of antineoplastic and immunosuppressive drugs, sequela: Secondary | ICD-10-CM | POA: Insufficient documentation

## 2017-05-04 DIAGNOSIS — M79605 Pain in left leg: Secondary | ICD-10-CM | POA: Insufficient documentation

## 2017-05-04 DIAGNOSIS — F17210 Nicotine dependence, cigarettes, uncomplicated: Secondary | ICD-10-CM | POA: Insufficient documentation

## 2017-05-04 DIAGNOSIS — G620 Drug-induced polyneuropathy: Secondary | ICD-10-CM | POA: Diagnosis not present

## 2017-05-04 DIAGNOSIS — G8929 Other chronic pain: Secondary | ICD-10-CM | POA: Diagnosis not present

## 2017-05-04 DIAGNOSIS — Z760 Encounter for issue of repeat prescription: Secondary | ICD-10-CM | POA: Insufficient documentation

## 2017-05-04 DIAGNOSIS — M961 Postlaminectomy syndrome, not elsewhere classified: Secondary | ICD-10-CM

## 2017-05-04 DIAGNOSIS — M5416 Radiculopathy, lumbar region: Secondary | ICD-10-CM

## 2017-05-04 DIAGNOSIS — M545 Low back pain: Secondary | ICD-10-CM | POA: Insufficient documentation

## 2017-05-04 DIAGNOSIS — Z9221 Personal history of antineoplastic chemotherapy: Secondary | ICD-10-CM | POA: Diagnosis not present

## 2017-05-04 MED ORDER — HYDROCODONE-ACETAMINOPHEN 10-325 MG PO TABS: ORAL_TABLET | ORAL | 0 refills | Status: DC

## 2017-05-04 NOTE — Progress Notes (Signed)
Subjective:    Patient ID: Elizabeth Torres, female    DOB: [birth year], 41 y.o.   MRN: [id number]  HPI Reviewed PMP aware data, and Rx score 451. Overdose risk score 260 Additional risk factor of being on greater than 40 mEq of morphine per day  Still having some pain over the right trapezius. She has constant throbbing pain which is fairly well-controlled by her current medications.  Patient works in Teacher, adult education, walks a lot, has use a fitness tracker has documented about 10,000-12,000 steps Pain Inventory Average Pain 4 Pain Right Now 3 My pain is constant, dull and aching  In the last 24 hours, has pain interfered with the following? General activity 3 Relation with others 3 Enjoyment of life 2 What TIME of day is your pain at its worst? evening Sleep (in general) Fair  Pain is worse with: sitting, inactivity and standing Pain improves with: rest and medication Relief from Meds: 5  Mobility walk without assistance  Function Do you have any goals in this area?  no  Neuro/Psych No problems in this area  Prior Studies Any changes since last visit?  no  Physicians involved in your care Any changes since last visit?  no   Family History  Problem Relation Age of Onset  . Diabetes Mother   . Hypertension Mother   . Cancer Paternal Uncle   . Breast cancer Paternal Uncle        40'S  . Breast cancer Paternal Aunt        48'S   Social History   Social History  . Marital status: Married    Spouse name: N/A  . Number of children: N/A  . Years of education: N/A   Social History Main Topics  . Smoking status: Current Every Day Smoker    Packs/day: 1.00    Types: Cigarettes  . Smokeless tobacco: Never Used  . Alcohol use Yes     Comment: socially  . Drug use: No  . Sexual activity: Not on file   Other Topics Concern  . Not on file   Social History Narrative  . No narrative on file   Past Surgical History:  Procedure Laterality Date  . BREAST BIOPSY  Right 07/2011   invasive ductal carcinoma  . 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 RAd and chemo tx  . BREAST SURGERY    . mastectomy Right    partial with lymph node dissection  . PORT A CATH REVISION    . PORT-A-CATH REMOVAL  11-07-15   Dr Bary Castilla  . SPINE SURGERY  2008   Dr Joya Salm   Past Medical History:  Diagnosis Date  . BRCA negative   . Breast cancer ([place]) 2013   RT LUMPECTOMY with chemo and rad tx  . Cancer ([place])   . Personal history of chemotherapy 2013   for breast ca  . Radiation 2013   BREAST CA  . Status post chemotherapy 2013   BREAST CA   There were no vitals taken for this visit.  Opioid Risk Score:   Fall Risk Score:  `1  Depression screen PHQ 2/9  Depression screen [facility] 2/9 04/06/2017 03/03/2017 03/13/2016 10/10/2015 08/15/2015 04/17/2015 02/19/2015  Decreased Interest 0 0 0 0 0 0 0  Down, Depressed, Hopeless 0 0 0 0 0 0 0  PHQ - 2 Score 0 0 0 0 0 0 0  Altered sleeping - - - 2 - - 1  Tired, decreased energy - - - 1 - - 1  Change in appetite - - - 0 - - 0  Feeling bad or failure about yourself  - - - 0 - - 0  Trouble concentrating - - - 0 - - 0  Moving slowly or fidgety/restless - - - 0 - - 0  Suicidal thoughts - - - - - - 0  PHQ-9 Score - - - 3 - - 2  Difficult doing work/chores - - - Not difficult at all - - -     Review of Systems  Constitutional: Negative.   HENT: Negative.   Eyes: Negative.   Respiratory: Negative.   Cardiovascular: Negative.   Gastrointestinal: Negative.   Endocrine: Negative.   Genitourinary: Negative.   Musculoskeletal: Negative.   Skin: Negative.   Allergic/Immunologic: Negative.   Neurological: Negative.   Hematological: Negative.   Psychiatric/Behavioral: Negative.   All other systems reviewed and are negative.      Objective:   Physical Exam  Constitutional: She is oriented to person, place, and time. She appears well-developed and well-nourished. No distress.  HENT:    Head: Normocephalic and atraumatic.  Eyes: Pupils are equal, round, and reactive to light. Conjunctivae and EOM are normal.  Neck: Normal range of motion. No JVD present.  Cardiovascular: Normal rate, regular rhythm and normal heart sounds.   No murmur heard. Pulmonary/Chest: Effort normal and breath sounds normal. No stridor. No respiratory distress. She has no wheezes.  Abdominal: Soft. Bowel sounds are normal. She exhibits no distension. There is no tenderness.  Musculoskeletal: She exhibits no edema.       Lumbar back: She exhibits tenderness. She exhibits normal range of motion, no deformity and no spasm.  Neurological: She is alert and oriented to person, place, and time. She displays no atrophy. She exhibits normal muscle tone. Coordination and gait normal.  Motor strength is 5/5 bilateral hip flexor, knee extensor, ankle dorsal flexor.   Skin: Skin is warm and dry. She is not diaphoretic.  Psychiatric: She has a normal mood and affect. Her behavior is normal. Judgment and thought content normal.  Nursing note and vitals reviewed.         Assessment & Plan:  1. Lumbar postlaminectomy syndrome with chronic axial and radicular discomfort. 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 This consists of regular clinic visits, examinations, urine drug screen, pill counts as well as use of [place] controlled substance reporting System.  Continue hydrocodone 10 mg 4 times per day. Continue Ambien 5 mg daily at bedtime when necessary Continue Pamelor 10 mg 2 tablets daily at bedtime

## 2017-05-04 NOTE — Patient Instructions (Signed)
We discussed your prescription management program results today. If you have any questions on that you can asked [name] next month

## 2017-06-04 ENCOUNTER — Encounter: Payer: 59 | Attending: Physical Medicine and Rehabilitation | Admitting: Registered Nurse

## 2017-06-04 ENCOUNTER — Encounter: Payer: Self-pay | Admitting: Registered Nurse

## 2017-06-04 VITALS — BP 125/86 | HR 98

## 2017-06-04 DIAGNOSIS — G894 Chronic pain syndrome: Secondary | ICD-10-CM

## 2017-06-04 DIAGNOSIS — M961 Postlaminectomy syndrome, not elsewhere classified: Secondary | ICD-10-CM | POA: Diagnosis not present

## 2017-06-04 DIAGNOSIS — G620 Drug-induced polyneuropathy: Secondary | ICD-10-CM | POA: Diagnosis not present

## 2017-06-04 DIAGNOSIS — M5416 Radiculopathy, lumbar region: Secondary | ICD-10-CM | POA: Diagnosis not present

## 2017-06-04 DIAGNOSIS — Z79899 Other long term (current) drug therapy: Secondary | ICD-10-CM

## 2017-06-04 DIAGNOSIS — Z760 Encounter for issue of repeat prescription: Secondary | ICD-10-CM | POA: Diagnosis present

## 2017-06-04 DIAGNOSIS — F17210 Nicotine dependence, cigarettes, uncomplicated: Secondary | ICD-10-CM | POA: Insufficient documentation

## 2017-06-04 DIAGNOSIS — M79605 Pain in left leg: Secondary | ICD-10-CM | POA: Insufficient documentation

## 2017-06-04 DIAGNOSIS — M545 Low back pain: Secondary | ICD-10-CM | POA: Diagnosis not present

## 2017-06-04 DIAGNOSIS — Z5181 Encounter for therapeutic drug level monitoring: Secondary | ICD-10-CM

## 2017-06-04 DIAGNOSIS — Z9221 Personal history of antineoplastic chemotherapy: Secondary | ICD-10-CM | POA: Insufficient documentation

## 2017-06-04 DIAGNOSIS — T451X5S Adverse effect of antineoplastic and immunosuppressive drugs, sequela: Secondary | ICD-10-CM | POA: Insufficient documentation

## 2017-06-04 DIAGNOSIS — M79604 Pain in right leg: Secondary | ICD-10-CM | POA: Diagnosis not present

## 2017-06-04 DIAGNOSIS — T451X5A Adverse effect of antineoplastic and immunosuppressive drugs, initial encounter: Secondary | ICD-10-CM | POA: Diagnosis not present

## 2017-06-04 DIAGNOSIS — G8929 Other chronic pain: Secondary | ICD-10-CM | POA: Insufficient documentation

## 2017-06-04 MED ORDER — HYDROCODONE-ACETAMINOPHEN 10-325 MG PO TABS: ORAL_TABLET | ORAL | 0 refills | Status: DC

## 2017-06-04 MED ORDER — NORTRIPTYLINE HCL 10 MG PO CAPS: 20.0000 mg | ORAL_CAPSULE | Freq: Every day | ORAL | 2 refills | Status: DC

## 2017-06-04 NOTE — Progress Notes (Signed)
Subjective:    Patient ID: Elizabeth Torres, female    DOB: [birth year], 41 y.o.   MRN: [id number]  HPI: Elizabeth Torres is a 41 year old female who returns for follow up appointmentfor chronic pain and medication refill. Elizabeth Torres states her pain is located in her lower back radiating into herbilaterallower extremities. Elizabeth Torres rates her pain 4. Her current exercise regime is walking.   Elizabeth Torres Morphine equivalent is 40.00 MME.    Last UDS was on 12/01/2016 it was consistent.  Elizabeth Torres's working 40 hours a week.  Pain Inventory Average Pain 4 Pain Right Now 4 My pain is constant, dull and aching  In the last 24 hours, has pain interfered with the following? General activity 4 Relation with others 3 Enjoyment of life 3 What TIME of day is your pain at its worst? evening Sleep (in general) Fair  Pain is worse with: sitting, inactivity and standing Pain improves with: rest and medication Relief from Meds: 5  Mobility Do you have any goals in this area?  no  Function Do you have any goals in this area?  no  Neuro/Psych No problems in this area  Prior Studies Any changes since last visit?  no  Physicians involved in your care Any changes since last visit?  no   Family History  Problem Relation Age of Onset  . Diabetes Mother   . Hypertension Mother   . Cancer Paternal Uncle   . Breast cancer Paternal Uncle        40'S  . Breast cancer Paternal Aunt        1'S   Social History   Social History  . Marital status: Married    Spouse name: N/A  . Number of children: N/A  . Years of education: N/A   Social History Main Topics  . Smoking status: Current Every Day Smoker    Packs/day: 1.00    Types: Cigarettes  . Smokeless tobacco: Never Used  . Alcohol use Yes     Comment: socially  . Drug use: No  . Sexual activity: Not on file   Other Topics Concern  . Not on file   Social History Narrative  . No narrative on file   Past Surgical History:  Procedure  Laterality Date  . BREAST BIOPSY Right 07/2011   invasive ductal carcinoma  . 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 RAd and chemo tx  . BREAST SURGERY    . mastectomy Right    partial with lymph node dissection  . PORT A CATH REVISION    . PORT-A-CATH REMOVAL  11-07-15   Dr Bary Castilla  . SPINE SURGERY  2008   Dr Joya Salm   Past Medical History:  Diagnosis Date  . BRCA negative   . Breast cancer ([place]) 2013   RT LUMPECTOMY with chemo and rad tx  . Cancer ([place])   . Personal history of chemotherapy 2013   for breast ca  . Radiation 2013   BREAST CA  . Status post chemotherapy 2013   BREAST CA   There were no vitals taken for this visit.  Opioid Risk Score:  1 Fall Risk Score:  `1  Depression screen PHQ 2/9  Depression screen [facility] 2/9 04/06/2017 03/03/2017 03/13/2016 10/10/2015 08/15/2015 04/17/2015 02/19/2015  Decreased Interest 0 0 0 0 0 0 0  Down, Depressed, Hopeless 0 0 0 0 0 0 0  PHQ - 2 Score 0 0 0  0 0 0 0  Altered sleeping - - - 2 - - 1  Tired, decreased energy - - - 1 - - 1  Change in appetite - - - 0 - - 0  Feeling bad or failure about yourself  - - - 0 - - 0  Trouble concentrating - - - 0 - - 0  Moving slowly or fidgety/restless - - - 0 - - 0  Suicidal thoughts - - - - - - 0  PHQ-9 Score - - - 3 - - 2  Difficult doing work/chores - - - Not difficult at all - - -    Review of Systems  Constitutional: Negative.   HENT: Negative.   Eyes: Negative.   Respiratory: Negative.   Cardiovascular: Negative.   Gastrointestinal: Negative.   Endocrine: Negative.   Genitourinary: Negative.   Musculoskeletal: Negative.   Skin: Negative.   Allergic/Immunologic: Negative.   Neurological: Negative.   Hematological: Negative.   Psychiatric/Behavioral: Negative.   All other systems reviewed and are negative.      Objective:   Physical Exam  Constitutional: Elizabeth Torres is oriented to person, place, and time. Elizabeth Torres appears well-developed and  well-nourished.  HENT:  Head: Normocephalic and atraumatic.  Neck: Normal range of motion. Neck supple.  Cardiovascular: Normal rate and regular rhythm.   Pulmonary/Chest: Effort normal and breath sounds normal.  Musculoskeletal:  Normal Muscle Bulk and Muscle Testing Reveals: Upper Extremities: Full ROM and Muscle Strength 5/5 Lumbar Paraspinal Tenderness: L-3-L-5 Lower Extremities: Full ROM and Muscle Strength 5/5 Arises from chair with ease Narrow Based Gait  Neurological: Elizabeth Torres is alert and oriented to person, place, and time.  Skin: Skin is warm and dry.  Psychiatric: Elizabeth Torres has a normal mood and affect.  Nursing note and vitals reviewed.         Assessment & Plan:  1. Lumbar postlaminectomy syndrome status post L5-S1 fusion with chronic S1 radiculopathy. 06/04/2017 Continue: Hydrocodone 10/325 mg #120--one every 6 hours as needed for pain. We will continue the opioid monitoring program, this consists of regular clinic visits, examinations, urine drug screen, pill counts as well as use of [place] Controlled Substance reporting System. 2. 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 06/04/2017 3. Insomnia: Continue Ambien. 06/04/2017 4. Cervical Radiculopathy:. ContinuePamelor. 06/04/2017  20 minutes of face to face patient care time was spent during this visit. All questions were encouraged and answered.  F/U in 1 month.

## 2017-07-07 ENCOUNTER — Encounter: Payer: 59 | Attending: Physical Medicine and Rehabilitation | Admitting: Registered Nurse

## 2017-07-07 ENCOUNTER — Encounter: Payer: Self-pay | Admitting: Registered Nurse

## 2017-07-07 ENCOUNTER — Telehealth: Payer: Self-pay | Admitting: Registered Nurse

## 2017-07-07 VITALS — BP 127/82 | HR 96 | Resp 14

## 2017-07-07 DIAGNOSIS — G620 Drug-induced polyneuropathy: Secondary | ICD-10-CM | POA: Insufficient documentation

## 2017-07-07 DIAGNOSIS — M79604 Pain in right leg: Secondary | ICD-10-CM | POA: Diagnosis not present

## 2017-07-07 DIAGNOSIS — Z9221 Personal history of antineoplastic chemotherapy: Secondary | ICD-10-CM | POA: Insufficient documentation

## 2017-07-07 DIAGNOSIS — M961 Postlaminectomy syndrome, not elsewhere classified: Secondary | ICD-10-CM | POA: Diagnosis not present

## 2017-07-07 DIAGNOSIS — M5416 Radiculopathy, lumbar region: Secondary | ICD-10-CM

## 2017-07-07 DIAGNOSIS — G894 Chronic pain syndrome: Secondary | ICD-10-CM | POA: Diagnosis not present

## 2017-07-07 DIAGNOSIS — F17210 Nicotine dependence, cigarettes, uncomplicated: Secondary | ICD-10-CM | POA: Insufficient documentation

## 2017-07-07 DIAGNOSIS — G8929 Other chronic pain: Secondary | ICD-10-CM | POA: Insufficient documentation

## 2017-07-07 DIAGNOSIS — Z760 Encounter for issue of repeat prescription: Secondary | ICD-10-CM | POA: Insufficient documentation

## 2017-07-07 DIAGNOSIS — M545 Low back pain: Secondary | ICD-10-CM | POA: Diagnosis not present

## 2017-07-07 DIAGNOSIS — T451X5A Adverse effect of antineoplastic and immunosuppressive drugs, initial encounter: Secondary | ICD-10-CM

## 2017-07-07 DIAGNOSIS — T451X5S Adverse effect of antineoplastic and immunosuppressive drugs, sequela: Secondary | ICD-10-CM | POA: Insufficient documentation

## 2017-07-07 DIAGNOSIS — M79605 Pain in left leg: Secondary | ICD-10-CM | POA: Diagnosis not present

## 2017-07-07 DIAGNOSIS — Z79899 Other long term (current) drug therapy: Secondary | ICD-10-CM | POA: Diagnosis not present

## 2017-07-07 DIAGNOSIS — Z5181 Encounter for therapeutic drug level monitoring: Secondary | ICD-10-CM

## 2017-07-07 MED ORDER — HYDROCODONE-ACETAMINOPHEN 10-325 MG PO TABS: ORAL_TABLET | ORAL | 0 refills | Status: DC

## 2017-07-07 MED ORDER — ZOLPIDEM TARTRATE 5 MG PO TABS: 5.0000 mg | ORAL_TABLET | Freq: Every evening | ORAL | 2 refills | Status: DC | PRN

## 2017-07-07 NOTE — Telephone Encounter (Signed)
On 07/07/2017 the  [place] was reviewed no conflict was seen on the [place] with multiple prescribers. Elizabeth Torres has a signed narcotic contract with our office. If there were any discrepancies this would have been reported to her physician.

## 2017-07-07 NOTE — Progress Notes (Signed)
Subjective:    Patient ID: LAGUANA DESAUTEL, female    DOB: 09-13-1975, 41 y.o.   MRN: [id number]  HPI: Ms. CORNELL GABER is a 41 year old female who returns for follow up appointmentfor chronic pain and medication refill. She states her pain is located in her lower back radiating into herbilaterallower extremities. She rates her pain 3. Her current exercise regime is walking.   Ms. Paule Morphine equivalent is 40.00 MME.    Last UDS was on 12/01/2016 it was consistent.  She's working 40 hours a week.  Pain Inventory Average Pain 4 Pain Right Now 3 My pain is constant, dull and aching  In the last 24 hours, has pain interfered with the following? General activity 3 Relation with others 3 Enjoyment of life 2 What TIME of day is your pain at its worst? evening Sleep (in general) Fair  Pain is worse with: sitting, inactivity and standing Pain improves with: rest and medication Relief from Meds: 5  Mobility walk without assistance do you drive?  no Do you have any goals in this area?  no  Function Do you have any goals in this area?  no  Neuro/Psych No problems in this area  Prior Studies Any changes since last visit?  no  Physicians involved in your care Any changes since last visit?  no   Family History  Problem Relation Age of Onset  . Diabetes Mother   . Hypertension Mother   . Cancer Paternal Uncle   . Breast cancer Paternal Uncle        40'S  . Breast cancer Paternal Aunt        74'S   Social History   Socioeconomic History  . Marital status: Married    Spouse name: None  . Number of children: None  . Years of education: None  . Highest education level: None  Social Needs  . Financial resource strain: None  . Food insecurity - worry: None  . Food insecurity - inability: None  . Transportation needs - medical: None  . Transportation needs - non-medical: None  Occupational History  . None  Tobacco Use  . Smoking status: Current Every Day  Smoker    Packs/day: 1.00    Types: Cigarettes  . Smokeless tobacco: Never Used  Substance and Sexual Activity  . Alcohol use: Yes    Comment: socially  . Drug use: No  . Sexual activity: None  Other Topics Concern  . None  Social History Narrative  . None   Past Surgical History:  Procedure Laterality Date  . BREAST BIOPSY Right 07/2011   invasive ductal carcinoma  . 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 RAd and chemo tx  . BREAST SURGERY    . mastectomy Right    partial with lymph node dissection  . PORT A CATH REVISION    . PORT-A-CATH REMOVAL  11-07-15   Dr Bary Castilla  . SPINE SURGERY  2008   Dr Joya Salm   Past Medical History:  Diagnosis Date  . BRCA negative   . Breast cancer ([place]) 2013   RT LUMPECTOMY with chemo and rad tx  . Cancer ([place])   . Personal history of chemotherapy 2013   for breast ca  . Radiation 2013   BREAST CA  . Status post chemotherapy 2013   BREAST CA   BP 127/82 (BP Location: Left Arm, Patient Position: Sitting, Cuff Size: Normal)   Pulse 96  Resp 14   SpO2 94%   Opioid Risk Score:  1 Fall Risk Score:  `1  Depression screen PHQ 2/9  Depression screen [facility] 2/9 04/06/2017 03/03/2017 03/13/2016 10/10/2015 08/15/2015 04/17/2015 02/19/2015  Decreased Interest 0 0 0 0 0 0 0  Down, Depressed, Hopeless 0 0 0 0 0 0 0  PHQ - 2 Score 0 0 0 0 0 0 0  Altered sleeping - - - 2 - - 1  Tired, decreased energy - - - 1 - - 1  Change in appetite - - - 0 - - 0  Feeling bad or failure about yourself  - - - 0 - - 0  Trouble concentrating - - - 0 - - 0  Moving slowly or fidgety/restless - - - 0 - - 0  Suicidal thoughts - - - - - - 0  PHQ-9 Score - - - 3 - - 2  Difficult doing work/chores - - - Not difficult at all - - -    Review of Systems  Constitutional: Negative.   HENT: Negative.   Eyes: Negative.   Respiratory: Negative.   Cardiovascular: Negative.   Gastrointestinal: Negative.   Endocrine: Negative.     Genitourinary: Negative.   Musculoskeletal: Positive for back pain.  Skin: Negative.   Allergic/Immunologic: Negative.   Neurological: Negative.   Hematological: Negative.   Psychiatric/Behavioral: Negative.   All other systems reviewed and are negative.      Objective:   Physical Exam  Constitutional: She is oriented to person, place, and time. She appears well-developed and well-nourished.  HENT:  Head: Normocephalic and atraumatic.  Neck: Normal range of motion. Neck supple.  Cardiovascular: Normal rate and regular rhythm.  Pulmonary/Chest: Effort normal and breath sounds normal.  Musculoskeletal:  Normal Muscle Bulk and Muscle Testing Reveals: Upper Extremities: Full ROM and Muscle Strength 5/5 Lumbar Paraspinal Tenderness: L-4-L-5 Lower Extremities: Full ROM and Muscle Strength 5/5 Arises from chair with ease Narrow Based Gait  Neurological: She is alert and oriented to person, place, and time.  Skin: Skin is warm and dry.  Psychiatric: She has a normal mood and affect.  Nursing note and vitals reviewed.         Assessment & Plan:  1. Lumbar postlaminectomy syndrome status post L5-S1 fusion with chronic S1 radiculopathy. 07/07/2017 Continue: Hydrocodone 10/325 mg #120--one every 6 hours as needed for pain. We will continue the opioid monitoring program, this consists of regular clinic visits, examinations, urine drug screen, pill counts as well as use of [place] Controlled Substance reporting System. 2. 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 07/07/2017 3. Insomnia: Continue Ambien. 07/07/2017 4. Cervical Radiculopathy:. ContinuePamelor. 07/07/2017  20 minutes of face to face patient care time was spent during this visit. All questions were encouraged and answered.  F/U in 1 month.

## 2017-08-06 ENCOUNTER — Encounter: Payer: Self-pay | Admitting: Registered Nurse

## 2017-08-06 ENCOUNTER — Encounter: Payer: 59 | Attending: Physical Medicine and Rehabilitation | Admitting: Registered Nurse

## 2017-08-06 VITALS — BP 133/86 | HR 100

## 2017-08-06 DIAGNOSIS — F17210 Nicotine dependence, cigarettes, uncomplicated: Secondary | ICD-10-CM | POA: Insufficient documentation

## 2017-08-06 DIAGNOSIS — M79604 Pain in right leg: Secondary | ICD-10-CM | POA: Insufficient documentation

## 2017-08-06 DIAGNOSIS — M79605 Pain in left leg: Secondary | ICD-10-CM | POA: Insufficient documentation

## 2017-08-06 DIAGNOSIS — G8929 Other chronic pain: Secondary | ICD-10-CM | POA: Diagnosis not present

## 2017-08-06 DIAGNOSIS — T451X5S Adverse effect of antineoplastic and immunosuppressive drugs, sequela: Secondary | ICD-10-CM | POA: Insufficient documentation

## 2017-08-06 DIAGNOSIS — G894 Chronic pain syndrome: Secondary | ICD-10-CM

## 2017-08-06 DIAGNOSIS — M5416 Radiculopathy, lumbar region: Secondary | ICD-10-CM | POA: Diagnosis not present

## 2017-08-06 DIAGNOSIS — M545 Low back pain: Secondary | ICD-10-CM | POA: Diagnosis not present

## 2017-08-06 DIAGNOSIS — T451X5A Adverse effect of antineoplastic and immunosuppressive drugs, initial encounter: Secondary | ICD-10-CM | POA: Diagnosis not present

## 2017-08-06 DIAGNOSIS — Z760 Encounter for issue of repeat prescription: Secondary | ICD-10-CM | POA: Insufficient documentation

## 2017-08-06 DIAGNOSIS — Z5181 Encounter for therapeutic drug level monitoring: Secondary | ICD-10-CM

## 2017-08-06 DIAGNOSIS — Z9221 Personal history of antineoplastic chemotherapy: Secondary | ICD-10-CM | POA: Diagnosis not present

## 2017-08-06 DIAGNOSIS — Z79899 Other long term (current) drug therapy: Secondary | ICD-10-CM

## 2017-08-06 DIAGNOSIS — M961 Postlaminectomy syndrome, not elsewhere classified: Secondary | ICD-10-CM | POA: Insufficient documentation

## 2017-08-06 DIAGNOSIS — G620 Drug-induced polyneuropathy: Secondary | ICD-10-CM | POA: Insufficient documentation

## 2017-08-06 MED ORDER — NORTRIPTYLINE HCL 10 MG PO CAPS: 20.0000 mg | ORAL_CAPSULE | Freq: Every day | ORAL | 2 refills | Status: DC

## 2017-08-06 MED ORDER — HYDROCODONE-ACETAMINOPHEN 10-325 MG PO TABS: ORAL_TABLET | ORAL | 0 refills | Status: DC

## 2017-08-06 NOTE — Progress Notes (Signed)
Subjective:    Patient ID: Elizabeth Torres, female    DOB: 24-Aug-1975, 42 y.o.   MRN: [id number]  HPI: Elizabeth Torres is a 42 year old female who returns for follow up appointmentfor chronic pain and medication refill. She states her pain is located in her lower back radiating into herbilaterallower extremities. She rates her pain 3. Her current exercise regime is walking.   Elizabeth Torres Morphine equivalent is 41.33 MME.    Last UDS was on 12/01/2016 it was consistent.  She's working 40 hours a week.  Pain Inventory Average Pain 4 Pain Right Now 3 My pain is constant, dull and aching  In the last 24 hours, has pain interfered with the following? General activity 3 Relation with others 3 Enjoyment of life 2 What TIME of day is your pain at its worst? evening Sleep (in general) Fair  Pain is worse with: sitting, inactivity and standing Pain improves with: rest and medication Relief from Meds: 5  Mobility walk without assistance Do you have any goals in this area?  no  Function Do you have any goals in this area?  no  Neuro/Psych No problems in this area  Prior Studies Any changes since last visit?  no  Physicians involved in your care Any changes since last visit?  no   Family History  Problem Relation Age of Onset  . Diabetes Mother   . Hypertension Mother   . Cancer Paternal Uncle   . Breast cancer Paternal Uncle        40'S  . Breast cancer Paternal Aunt        15'S   Social History   Socioeconomic History  . Marital status: Married    Spouse name: Not on file  . Number of children: Not on file  . Years of education: Not on file  . Highest education level: Not on file  Social Needs  . Financial resource strain: Not on file  . Food insecurity - worry: Not on file  . Food insecurity - inability: Not on file  . Transportation needs - medical: Not on file  . Transportation needs - non-medical: Not on file  Occupational History  . Not on file    Tobacco Use  . Smoking status: Current Every Day Smoker    Packs/day: 1.00    Types: Cigarettes  . Smokeless tobacco: Never Used  Substance and Sexual Activity  . Alcohol use: Yes    Comment: socially  . Drug use: No  . Sexual activity: Not on file  Other Topics Concern  . Not on file  Social History Narrative  . Not on file   Past Surgical History:  Procedure Laterality Date  . BREAST BIOPSY Right 07/2011   invasive ductal carcinoma  . 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 RAd and chemo tx  . BREAST SURGERY    . mastectomy Right    partial with lymph node dissection  . PORT A CATH REVISION    . PORT-A-CATH REMOVAL  11-07-15   Dr Bary Castilla  . SPINE SURGERY  2008   Dr Joya Salm   Past Medical History:  Diagnosis Date  . BRCA negative   . Breast cancer ([place]) 2013   RT LUMPECTOMY with chemo and rad tx  . Cancer ([place])   . Personal history of chemotherapy 2013   for breast ca  . Radiation 2013   BREAST CA  . Status post chemotherapy 2013  BREAST CA   There were no vitals taken for this visit.  Opioid Risk Score:  1 Fall Risk Score:  `1  Depression screen PHQ 2/9  Depression screen [facility] 2/9 04/06/2017 03/03/2017 03/13/2016 10/10/2015 08/15/2015 04/17/2015 02/19/2015  Decreased Interest 0 0 0 0 0 0 0  Down, Depressed, Hopeless 0 0 0 0 0 0 0  PHQ - 2 Score 0 0 0 0 0 0 0  Altered sleeping - - - 2 - - 1  Tired, decreased energy - - - 1 - - 1  Change in appetite - - - 0 - - 0  Feeling bad or failure about yourself  - - - 0 - - 0  Trouble concentrating - - - 0 - - 0  Moving slowly or fidgety/restless - - - 0 - - 0  Suicidal thoughts - - - - - - 0  PHQ-9 Score - - - 3 - - 2  Difficult doing work/chores - - - Not difficult at all - - -    Review of Systems  Constitutional: Negative.   HENT: Negative.   Eyes: Negative.   Respiratory: Negative.   Cardiovascular: Negative.   Gastrointestinal: Negative.   Endocrine: Negative.    Genitourinary: Negative.   Musculoskeletal: Positive for back pain.  Skin: Negative.   Allergic/Immunologic: Negative.   Neurological: Negative.   Hematological: Negative.   Psychiatric/Behavioral: Negative.   All other systems reviewed and are negative.      Objective:   Physical Exam  Constitutional: She is oriented to person, place, and time. She appears well-developed and well-nourished.  HENT:  Head: Normocephalic and atraumatic.  Neck: Normal range of motion. Neck supple.  Cardiovascular: Normal rate and regular rhythm.  Pulmonary/Chest: Effort normal and breath sounds normal.  Musculoskeletal:  Normal Muscle Bulk and Muscle Testing Reveals: Upper Extremities: Full ROM and Muscle Strength 5/5 Back without spinal tenderness noted Lower Extremities: Full ROM and Muscle Strength 5/5 Arises from chair with ease Narrow Based Gait  Neurological: She is alert and oriented to person, place, and time.  Skin: Skin is warm and dry.  Psychiatric: She has a normal mood and affect.  Nursing note and vitals reviewed.         Assessment & Plan:  1. Lumbar postlaminectomy syndrome status post L5-S1 fusion with chronic S1 radiculopathy. 08/06/2017 Continue: Hydrocodone 10/325 mg #120--one every 6 hours as needed for pain. We will continue the opioid monitoring program, this consists of regular clinic visits, examinations, urine drug screen, pill counts as well as use of [place] Controlled Substance reporting System. 2. 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 08/06/2017 3. Insomnia: Continue Ambien. 08/06/2017 4. Cervical Radiculopathy:. ContinuePamelor. 08/06/2017  20 minutes of face to face patient care time was spent during this visit. All questions were encouraged and answered.  F/U in 1 month.

## 2017-09-06 ENCOUNTER — Telehealth: Payer: Self-pay | Admitting: Registered Nurse

## 2017-09-06 ENCOUNTER — Encounter: Payer: BLUE CROSS/BLUE SHIELD | Admitting: Registered Nurse

## 2017-09-06 NOTE — Telephone Encounter (Signed)
Pt had to cx appt today bc she was called into work. She has rescheduled for Wed Feb 6 at 1130. She needs for you to call her regarding her insurance situation.  [place]

## 2017-09-07 ENCOUNTER — Ambulatory Visit: Payer: 59 | Admitting: Registered Nurse

## 2017-09-07 NOTE — Telephone Encounter (Signed)
Return Elizabeth Torres call, she reports she has a new job in the same company  and  Still waiting on the date  her insurance will be active. She was instructed to call her Environmental manager and call office with date. We will be able to reschedule her appointment. She verbalizes understanding.

## 2017-09-08 ENCOUNTER — Encounter: Payer: BLUE CROSS/BLUE SHIELD | Attending: Physical Medicine and Rehabilitation | Admitting: Registered Nurse

## 2017-09-08 ENCOUNTER — Other Ambulatory Visit: Payer: Self-pay

## 2017-09-08 ENCOUNTER — Encounter: Payer: Self-pay | Admitting: Registered Nurse

## 2017-09-08 VITALS — BP 122/84 | HR 94

## 2017-09-08 DIAGNOSIS — Z760 Encounter for issue of repeat prescription: Secondary | ICD-10-CM | POA: Insufficient documentation

## 2017-09-08 DIAGNOSIS — Z79899 Other long term (current) drug therapy: Secondary | ICD-10-CM

## 2017-09-08 DIAGNOSIS — G8929 Other chronic pain: Secondary | ICD-10-CM | POA: Insufficient documentation

## 2017-09-08 DIAGNOSIS — M79604 Pain in right leg: Secondary | ICD-10-CM | POA: Insufficient documentation

## 2017-09-08 DIAGNOSIS — F17210 Nicotine dependence, cigarettes, uncomplicated: Secondary | ICD-10-CM | POA: Diagnosis not present

## 2017-09-08 DIAGNOSIS — M79605 Pain in left leg: Secondary | ICD-10-CM | POA: Insufficient documentation

## 2017-09-08 DIAGNOSIS — M961 Postlaminectomy syndrome, not elsewhere classified: Secondary | ICD-10-CM | POA: Diagnosis not present

## 2017-09-08 DIAGNOSIS — G620 Drug-induced polyneuropathy: Secondary | ICD-10-CM | POA: Insufficient documentation

## 2017-09-08 DIAGNOSIS — M545 Low back pain: Secondary | ICD-10-CM | POA: Insufficient documentation

## 2017-09-08 DIAGNOSIS — Z5181 Encounter for therapeutic drug level monitoring: Secondary | ICD-10-CM

## 2017-09-08 DIAGNOSIS — T451X5A Adverse effect of antineoplastic and immunosuppressive drugs, initial encounter: Secondary | ICD-10-CM | POA: Diagnosis not present

## 2017-09-08 DIAGNOSIS — G894 Chronic pain syndrome: Secondary | ICD-10-CM

## 2017-09-08 DIAGNOSIS — Z9221 Personal history of antineoplastic chemotherapy: Secondary | ICD-10-CM | POA: Diagnosis not present

## 2017-09-08 DIAGNOSIS — T451X5S Adverse effect of antineoplastic and immunosuppressive drugs, sequela: Secondary | ICD-10-CM | POA: Insufficient documentation

## 2017-09-08 DIAGNOSIS — M5416 Radiculopathy, lumbar region: Secondary | ICD-10-CM

## 2017-09-08 MED ORDER — HYDROCODONE-ACETAMINOPHEN 10-325 MG PO TABS: ORAL_TABLET | ORAL | 0 refills | Status: DC

## 2017-09-08 NOTE — Progress Notes (Signed)
Subjective:    Patient ID: Elizabeth Torres, female    DOB: 24-Jan-1976, 42 y.o.   MRN: [id number]  HPI: [name] is a 42 year old female who returns for follow up appointmentfor chronic pain and medication refill. She states her pain is located in her lower back radiating into her bilateral lower extremities. She rates her pain 5. Her current exercise regime is walking.   Elizabeth Torres Morphine equivalent is 36.00 MME.    Last UDS was on 12/01/2016 it was consistent.  She's working 40 hours a week.  Pain Inventory Average Pain 5 Pain Right Now 5 My pain is constant, dull and aching  In the last 24 hours, has pain interfered with the following? General activity 4 Relation with others 4 Enjoyment of life 5 What TIME of day is your pain at its worst? evening Sleep (in general) Fair  Pain is worse with: sitting, inactivity and standing Pain improves with: rest and medication Relief from Meds: 4  Mobility walk without assistance Do you have any goals in this area?  no  Function Do you have any goals in this area?  no  Neuro/Psych No problems in this area  Prior Studies Any changes since last visit?  no  Physicians involved in your care Any changes since last visit?  no   Family History  Problem Relation Age of Onset  . Diabetes Mother   . Hypertension Mother   . Cancer Paternal Uncle   . Breast cancer Paternal Uncle        40'S  . Breast cancer Paternal Aunt        79'S   Social History   Socioeconomic History  . Marital status: Married    Spouse name: None  . Number of children: None  . Years of education: None  . Highest education level: None  Social Needs  . Financial resource strain: None  . Food insecurity - worry: None  . Food insecurity - inability: None  . Transportation needs - medical: None  . Transportation needs - non-medical: None  Occupational History  . None  Tobacco Use  . Smoking status: Current Every Day Smoker    Packs/day:  1.00    Types: Cigarettes  . Smokeless tobacco: Never Used  Substance and Sexual Activity  . Alcohol use: Yes    Comment: socially  . Drug use: No  . Sexual activity: None  Other Topics Concern  . None  Social History Narrative  . None   Past Surgical History:  Procedure Laterality Date  . BREAST BIOPSY Right 07/2011   invasive ductal carcinoma  . 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 RAd and chemo tx  . BREAST SURGERY    . mastectomy Right    partial with lymph node dissection  . PORT A CATH REVISION    . PORT-A-CATH REMOVAL  11-07-15   Dr Bary Castilla  . SPINE SURGERY  2008   Dr Joya Salm   Past Medical History:  Diagnosis Date  . BRCA negative   . Breast cancer ([place]) 2013   RT LUMPECTOMY with chemo and rad tx  . Cancer ([place])   . Personal history of chemotherapy 2013   for breast ca  . Radiation 2013   BREAST CA  . Status post chemotherapy 2013   BREAST CA   BP 122/84   Pulse 94   SpO2 95%   Opioid Risk Score:  1 Fall Risk  Score:  `1  Depression screen PHQ 2/9  Depression screen [facility] 2/9 09/08/2017 04/06/2017 03/03/2017 03/13/2016 10/10/2015 08/15/2015 04/17/2015  Decreased Interest 0 0 0 0 0 0 0  Down, Depressed, Hopeless 0 0 0 0 0 0 0  PHQ - 2 Score 0 0 0 0 0 0 0  Altered sleeping - - - - 2 - -  Tired, decreased energy - - - - 1 - -  Change in appetite - - - - 0 - -  Feeling bad or failure about yourself  - - - - 0 - -  Trouble concentrating - - - - 0 - -  Moving slowly or fidgety/restless - - - - 0 - -  Suicidal thoughts - - - - - - -  PHQ-9 Score - - - - 3 - -  Difficult doing work/chores - - - - Not difficult at all - -    Review of Systems  Constitutional: Negative.   HENT: Negative.   Eyes: Negative.   Respiratory: Negative.   Cardiovascular: Negative.   Gastrointestinal: Negative.   Endocrine: Negative.   Genitourinary: Negative.   Musculoskeletal: Positive for back pain.  Skin: Negative.     Allergic/Immunologic: Negative.   Neurological: Negative.   Hematological: Negative.   Psychiatric/Behavioral: Negative.   All other systems reviewed and are negative.      Objective:   Physical Exam  Constitutional: She is oriented to person, place, and time. She appears well-developed and well-nourished.  HENT:  Head: Normocephalic and atraumatic.  Neck: Normal range of motion. Neck supple.  Cardiovascular: Normal rate and regular rhythm.  Pulmonary/Chest: Effort normal and breath sounds normal.  Musculoskeletal:  Normal Muscle Bulk and Muscle Testing Reveals: Upper Extremities: Full ROM and Muscle Strength 5/5 Lumbar Paraspinal Tenderness: L-3-L-5 Lower Extremities: Full ROM and Muscle Strength 5/5 Arises from chair with ease Narrow Based Gait  Neurological: She is alert and oriented to person, place, and time.  Skin: Skin is warm and dry.  Psychiatric: She has a normal mood and affect.  Nursing note and vitals reviewed.         Assessment & Plan:  1. Lumbar postlaminectomy syndrome status post L5-S1 fusion with chronic S1 radiculopathy. Continue Amitriptylline 09/08/2017 Continue: Hydrocodone 10/325 mg #120--one every 6 hours as needed for pain. We will continue the opioid monitoring program, this consists of regular clinic visits, examinations, urine drug screen, pill counts as well as use of [place] Controlled Substance reporting System. 2. 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 09/08/2017 3. Insomnia: Continue Ambien. 09/08/2017  20 minutes of face to face patient care time was spent during this visit. All questions were encouraged and answered.  F/U in 1 month.

## 2017-10-06 ENCOUNTER — Telehealth: Payer: Self-pay | Admitting: Registered Nurse

## 2017-10-06 ENCOUNTER — Encounter: Payer: BLUE CROSS/BLUE SHIELD | Admitting: Registered Nurse

## 2017-10-06 MED ORDER — HYDROCODONE-ACETAMINOPHEN 10-325 MG PO TABS: ORAL_TABLET | ORAL | 0 refills | Status: DC

## 2017-10-06 NOTE — Telephone Encounter (Signed)
This provider spoke with [name], she is ill. We will e-scribe her Hydrocodone, according to the PMP Aware Website she picked up her Hydrocodone 09/08/2017. [name] realizes she must be seen in our office this month, she verbalizes understanding.

## 2017-10-08 ENCOUNTER — Telehealth: Payer: Self-pay | Admitting: Oncology

## 2017-10-08 NOTE — Telephone Encounter (Signed)
Left pt VM to call me back to r\s appt on 10/13/17. Woodfin Ganja will be out of the office that afternoon.

## 2017-10-09 ENCOUNTER — Encounter: Payer: Self-pay | Admitting: Emergency Medicine

## 2017-10-09 ENCOUNTER — Other Ambulatory Visit: Payer: Self-pay

## 2017-10-09 ENCOUNTER — Emergency Department
Admission: EM | Admit: 2017-10-09 | Discharge: 2017-10-09 | Disposition: A | Discharge status: Home / Self Care | Payer: BLUE CROSS/BLUE SHIELD | Attending: Emergency Medicine | Admitting: Emergency Medicine

## 2017-10-09 ENCOUNTER — Emergency Department: Payer: BLUE CROSS/BLUE SHIELD

## 2017-10-09 DIAGNOSIS — Z79899 Other long term (current) drug therapy: Secondary | ICD-10-CM | POA: Insufficient documentation

## 2017-10-09 DIAGNOSIS — R05 Cough: Secondary | ICD-10-CM | POA: Diagnosis not present

## 2017-10-09 DIAGNOSIS — F17210 Nicotine dependence, cigarettes, uncomplicated: Secondary | ICD-10-CM | POA: Insufficient documentation

## 2017-10-09 DIAGNOSIS — J189 Pneumonia, unspecified organism: Secondary | ICD-10-CM | POA: Diagnosis not present

## 2017-10-09 DIAGNOSIS — R079 Chest pain, unspecified: Secondary | ICD-10-CM | POA: Diagnosis not present

## 2017-10-09 DIAGNOSIS — D509 Iron deficiency anemia, unspecified: Secondary | ICD-10-CM | POA: Diagnosis not present

## 2017-10-09 LAB — COMPREHENSIVE METABOLIC PANEL
ALT: 19 U/L (ref 14–54)
AST: 16 U/L (ref 15–41)
Albumin: 3.5 g/dL (ref 3.5–5.0)
Alkaline Phosphatase: 61 U/L (ref 38–126)
Anion gap: 8 (ref 5–15)
BUN: 10 mg/dL (ref 6–20)
CHLORIDE: 105 mmol/L (ref 101–111)
CO2: 22 mmol/L (ref 22–32)
Calcium: 8.9 mg/dL (ref 8.9–10.3)
Creatinine, Ser: 0.6 mg/dL (ref 0.44–1.00)
Glucose, Bld: 104 mg/dL — ABNORMAL HIGH (ref 65–99)
Potassium: 3.6 mmol/L (ref 3.5–5.1)
Sodium: 135 mmol/L (ref 135–145)
Total Bilirubin: 0.6 mg/dL (ref 0.3–1.2)
Total Protein: 7 g/dL (ref 6.5–8.1)

## 2017-10-09 LAB — CBC WITH DIFFERENTIAL/PLATELET
BASOS ABS: 0.1 10*3/uL (ref 0–0.1)
Basophils Relative: 0 %
EOS PCT: 2 %
Eosinophils Absolute: 0.2 10*3/uL (ref 0–0.7)
HCT: 37.7 % (ref 35.0–47.0)
Hemoglobin: 12.7 g/dL (ref 12.0–16.0)
LYMPHS PCT: 10 %
Lymphs Abs: 1.1 10*3/uL (ref 1.0–3.6)
MCH: 31.3 pg (ref 26.0–34.0)
MCHC: 33.7 g/dL (ref 32.0–36.0)
MCV: 92.9 fL (ref 80.0–100.0)
MONO ABS: 0.8 10*3/uL (ref 0.2–0.9)
Monocytes Relative: 7 %
Neutro Abs: 9.7 10*3/uL — ABNORMAL HIGH (ref 1.4–6.5)
Neutrophils Relative %: 81 %
PLATELETS: 199 10*3/uL (ref 150–440)
RBC: 4.06 MIL/uL (ref 3.80–5.20)
RDW: 14.2 % (ref 11.5–14.5)
WBC: 11.9 10*3/uL — ABNORMAL HIGH (ref 3.6–11.0)

## 2017-10-09 MED ORDER — PREDNISONE 10 MG PO TABS: 50.0000 mg | ORAL_TABLET | Freq: Every day | ORAL | 0 refills | Status: DC

## 2017-10-09 MED ORDER — GUAIFENESIN-CODEINE 100-10 MG/5ML PO SYRP: 5.0000 mL | ORAL_SOLUTION | Freq: Three times a day (TID) | ORAL | 0 refills | Status: DC | PRN

## 2017-10-09 MED ORDER — SODIUM CHLORIDE 0.9 % IV BOLUS (SEPSIS)
1000.0000 mL | Freq: Once | INTRAVENOUS | Status: AC
  Administered 2017-10-09: 1000 mL via INTRAVENOUS

## 2017-10-09 MED ORDER — LEVOFLOXACIN 750 MG PO TABS: 750.0000 mg | ORAL_TABLET | Freq: Every day | ORAL | 0 refills | Status: AC

## 2017-10-09 MED ORDER — ALBUTEROL SULFATE HFA 108 (90 BASE) MCG/ACT IN AERS: 2.0000 | INHALATION_SPRAY | RESPIRATORY_TRACT | 1 refills | Status: DC | PRN

## 2017-10-09 MED ORDER — LEVOFLOXACIN IN D5W 750 MG/150ML IV SOLN
750.0000 mg | Freq: Once | INTRAVENOUS | Status: AC
  Administered 2017-10-09: 750 mg via INTRAVENOUS
  Filled 2017-10-09: qty 150

## 2017-10-09 MED ORDER — IPRATROPIUM-ALBUTEROL 0.5-2.5 (3) MG/3ML IN SOLN
3.0000 mL | Freq: Once | RESPIRATORY_TRACT | Status: AC
Start: 2017-10-09 — End: 2017-10-09
  Administered 2017-10-09: 3 mL via RESPIRATORY_TRACT
  Filled 2017-10-09: qty 3

## 2017-10-09 NOTE — ED Triage Notes (Signed)
States began chills and general body aches 4 days ago. States has mid back pain which increases with cough or inspiration.

## 2017-10-09 NOTE — ED Notes (Signed)
See triage note.  States she has been feeling bad for several days  Now having pain in chest with cough and inspiration

## 2017-10-09 NOTE — ED Provider Notes (Signed)
[facility] Emergency Department Provider Note  ____________________________________________  Time seen: Approximately 3:48 PM  I have reviewed the triage vital signs and the nursing notes.   HISTORY  Chief Complaint Chills and Generalized Body Aches   HPI Elizabeth Torres is a 42 y.o. female who presents to the emergency department for evaluation and treatment of cough.  She was initially treated by her primary care provider with azithromycin and had some improvement until yesterday.  She still having pain in her chest with deep inspiration and cough.  She is also had some back pain that increases only with cough or inspiration.  Chills started 4 days ago with out a measured temperature.  Past Medical History:  Diagnosis Date  . BRCA negative   . Breast cancer ([place]) 2013   RT LUMPECTOMY with chemo and rad tx  . Cancer ([place])   . Personal history of chemotherapy 2013   for breast ca  . Radiation 2013   BREAST CA  . Status post chemotherapy 2013   BREAST CA    Patient Active Problem List   Diagnosis Date Noted  . Cervical myofascial pain syndrome 02/14/2016  . History of breast cancer 11/07/2015  . Postlaminectomy syndrome, lumbar region 04/12/2012  . Chemotherapy-induced neuropathy ([place]) 04/12/2012  . 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 ([place]) 08/07/2011    Past Surgical History:  Procedure Laterality Date  . BREAST BIOPSY Right 07/2011   invasive ductal carcinoma  . 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 RAd and chemo tx  . BREAST SURGERY    . mastectomy Right    partial with lymph node dissection  . PORT A CATH REVISION    . PORT-A-CATH REMOVAL  11-07-15   Dr Bary Castilla  . SPINE SURGERY  2008   Dr Joya Salm    Prior to Admission medications   Medication Sig Start Date End Date Taking? Authorizing Provider  albuterol (PROVENTIL HFA;VENTOLIN HFA) 108 (90 Base) MCG/ACT inhaler  Inhale 2 puffs into the lungs every 4 (four) hours as needed for wheezing or shortness of breath. 10/09/17   Arihana Ambrocio B, FNP  guaiFENesin-codeine (ROBITUSSIN AC) 100-10 MG/5ML syrup Take 5 mLs by mouth 3 (three) times daily as needed for cough. 10/09/17   Chaia Ikard, Johnette Abraham B, FNP  HYDROcodone-acetaminophen (NORCO) 10-325 MG tablet TAKE 1 TABLET BY MOUTH EVERY 6 HOURS AS NEEDED FOR PAIN 10/06/17   Bayard Hugger, NP  ibuprofen (ADVIL,MOTRIN) 800 MG tablet Take 800 mg by mouth every 6 (six) hours as needed (pain).  04/19/13   Prueter, Santiago Glad, PA-C  levofloxacin (LEVAQUIN) 750 MG tablet Take 1 tablet (750 mg total) by mouth daily for 7 days. 10/09/17 10/16/17  Eva Vallee, Johnette Abraham B, FNP  nortriptyline (PAMELOR) 10 MG capsule Take 2 capsules (20 mg total) by mouth at bedtime. 08/06/17   Bayard Hugger, NP  predniSONE (DELTASONE) 10 MG tablet Take 5 tablets (50 mg total) by mouth daily. 10/09/17   Neven Fina B, FNP  zolpidem (AMBIEN) 5 MG tablet Take 1 tablet (5 mg total) by mouth at bedtime as needed. 07/07/17   Bayard Hugger, NP    Allergies Penicillins; Sulfa antibiotics; Ondansetron; and Zofran [ondansetron hcl]  Family History  Problem Relation Age of Onset  . Diabetes Mother   . Hypertension Mother   . Cancer Paternal Uncle   . Breast cancer Paternal Uncle        40'S  . Breast  cancer Paternal Aunt        28'S    Social History Social History   Tobacco Use  . Smoking status: Current Every Day Smoker    Packs/day: 1.00    Types: Cigarettes  . Smokeless tobacco: Never Used  Substance Use Topics  . Alcohol use: Yes    Comment: socially  . Drug use: No    Review of Systems Constitutional: Positive for fever/chills ENT: Negative for sore throat. Cardiovascular: Denies chest pain. Respiratory: Negative for shortness of breath.  Positive for cough. Gastrointestinal: Negative nausea, no vomiting.  No diarrhea.  Musculoskeletal: Negative for body aches Skin: Negative for  rash. Neurological: Negative for headaches ____________________________________________   PHYSICAL EXAM:  VITAL SIGNS: ED Triage Vitals  Enc Vitals Group     BP 10/09/17 0838 (!) 122/94     Pulse Rate 10/09/17 0838 (!) 102     Resp 10/09/17 0838 20     Temp 10/09/17 0838 99.7 F (37.6 C)     Temp Source 10/09/17 0838 Oral     SpO2 10/09/17 0838 97 %     Weight 10/09/17 0839 160 lb (72.6 kg)     Height 10/09/17 0839 '5\' 3"'  (1.6 m)     Head Circumference --      Peak Flow --      Pain Score 10/09/17 0839 4     Pain Loc --      Pain Edu? --      Excl. in [place]? --     Constitutional: Alert and oriented.  Acutely ill appearing and in no acute distress. Eyes: Conjunctivae are normal. EOMI. Ears: Bilateral tympanic membranes are normal Nose: No congestion noted; no rhinnorhea. Mouth/Throat: Mucous membranes are moist.  Oropharynx clear. Tonsils flat. Neck: No stridor.  Lymphatic: Anterior cervical lymphadenopathy. Cardiovascular: Normal rate, regular rhythm. Good peripheral circulation. Respiratory: Normal respiratory effort.  No retractions.  Rhonchi noted in the right lower lobe, air exchange is diminished in the right upper lobe and the left lower lobe.. Gastrointestinal: Soft and nontender.  Musculoskeletal: FROM x 4 extremities.  Neurologic:  Normal speech and language.  Skin:  Skin is warm, dry and intact. No rash noted. Psychiatric: Mood and affect are normal. Speech and behavior are normal.  ____________________________________________   LABS (all labs ordered are listed, but only abnormal results are displayed)  Labs Reviewed  CBC WITH DIFFERENTIAL/PLATELET - Abnormal; Notable for the following components:      Result Value   WBC 11.9 (*)    Neutro Abs 9.7 (*)    All other components within normal limits  COMPREHENSIVE METABOLIC PANEL - Abnormal; Notable for the following components:   Glucose, Bld 104 (*)    All other components within normal limits  CULTURE,  BLOOD (ROUTINE X 2)  CULTURE, BLOOD (ROUTINE X 2)   ____________________________________________  EKG  Not indicated ____________________________________________  RADIOLOGY  Patchy bilateral airspace opacities are concerning for multifocal pneumonia per radiology. ____________________________________________   PROCEDURES  Procedure(s) performed: None  Critical Care performed: No ____________________________________________   INITIAL IMPRESSION / ASSESSMENT AND PLAN / ED COURSE  42 y.o. female who presents to the emergency department for evaluation and treatment of cough that had improved after azithromycin.  Today, patient was given Levaquin of the and she will be given a prescription for the same.  Patient promises to follow-up with her primary care provider either Monday or Tuesday for a recheck.  She will be discharged home with prednisone, albuterol, and Robitussin-AC  as well.  She was given strict return precautions if any of her symptoms get worse over the weekend and she agrees to return if so.  Medications  ipratropium-albuterol (DUONEB) 0.5-2.5 (3) MG/3ML nebulizer solution 3 mL (3 mLs Nebulization Given 10/09/17 1021)  levofloxacin (LEVAQUIN) IVPB 750 mg (0 mg Intravenous Stopped 10/09/17 1210)  sodium chloride 0.9 % bolus 1,000 mL (0 mLs Intravenous Stopped 10/09/17 1211)    ED Discharge Orders        Ordered    levofloxacin (LEVAQUIN) 750 MG tablet  Daily     10/09/17 1206    predniSONE (DELTASONE) 10 MG tablet  Daily     10/09/17 1206    guaiFENesin-codeine (ROBITUSSIN AC) 100-10 MG/5ML syrup  3 times daily PRN     10/09/17 1206    albuterol (PROVENTIL HFA;VENTOLIN HFA) 108 (90 Base) MCG/ACT inhaler  Every 4 hours PRN     10/09/17 1206       Pertinent labs & imaging results that were available during my care of the patient were reviewed by me and considered in my medical decision making (see chart for details).    If controlled substance prescribed during  this visit, 12 month history viewed on the [place] prior to issuing an initial prescription for Schedule II or III opiod. ____________________________________________   FINAL CLINICAL IMPRESSION(S) / ED DIAGNOSES  Final diagnoses:  Pneumonia of both lungs due to infectious organism, unspecified part of lung    Note:  This document was prepared using Dragon voice recognition software and may include unintentional dictation errors.     Victorino Dike, FNP 10/09/17 1553    Nena Polio, MD 10/10/17 [phone]

## 2017-10-10 LAB — BLOOD CULTURE ID PANEL (REFLEXED)
ACINETOBACTER BAUMANNII: NOT DETECTED
CANDIDA GLABRATA: NOT DETECTED
CANDIDA KRUSEI: NOT DETECTED
CANDIDA PARAPSILOSIS: NOT DETECTED
CANDIDA TROPICALIS: NOT DETECTED
Candida albicans: NOT DETECTED
ENTEROBACTER CLOACAE COMPLEX: NOT DETECTED
ENTEROCOCCUS SPECIES: NOT DETECTED
ESCHERICHIA COLI: NOT DETECTED
Enterobacteriaceae species: NOT DETECTED
Haemophilus influenzae: NOT DETECTED
KLEBSIELLA OXYTOCA: NOT DETECTED
KLEBSIELLA PNEUMONIAE: NOT DETECTED
LISTERIA MONOCYTOGENES: NOT DETECTED
Neisseria meningitidis: NOT DETECTED
Proteus species: NOT DETECTED
Pseudomonas aeruginosa: NOT DETECTED
STREPTOCOCCUS PYOGENES: NOT DETECTED
Serratia marcescens: NOT DETECTED
Staphylococcus aureus (BCID): NOT DETECTED
Staphylococcus species: NOT DETECTED
Streptococcus agalactiae: NOT DETECTED
Streptococcus pneumoniae: NOT DETECTED
Streptococcus species: DETECTED — AB

## 2017-10-10 NOTE — Progress Notes (Signed)
PHARMACY - PHYSICIAN COMMUNICATION CRITICAL VALUE ALERT - BLOOD CULTURE IDENTIFICATION (BCID)  Elizabeth Torres is an 42 y.o. female who presented to [facility] on 10/09/2017 with a chief complaint of   Assessment:  1/4 Strep spp (include suspected source if known)  Name of physician (or Provider) Contacted: ED charge  Current antibiotics: d/c  Changes to prescribed antibiotics recommended:  TBD  Results for orders placed or performed during the hospital encounter of 10/09/17  Blood Culture ID Panel (Reflexed) (Collected: 10/09/2017 10:06 AM)  Result Value Ref Range   Enterococcus species NOT DETECTED NOT DETECTED   Listeria monocytogenes NOT DETECTED NOT DETECTED   Staphylococcus species NOT DETECTED NOT DETECTED   Staphylococcus aureus NOT DETECTED NOT DETECTED   Streptococcus species DETECTED (A) NOT DETECTED   Streptococcus agalactiae NOT DETECTED NOT DETECTED   Streptococcus pneumoniae NOT DETECTED NOT DETECTED   Streptococcus pyogenes NOT DETECTED NOT DETECTED   Acinetobacter baumannii NOT DETECTED NOT DETECTED   Enterobacteriaceae species NOT DETECTED NOT DETECTED   Enterobacter cloacae complex NOT DETECTED NOT DETECTED   Escherichia coli NOT DETECTED NOT DETECTED   Klebsiella oxytoca NOT DETECTED NOT DETECTED   Klebsiella pneumoniae NOT DETECTED NOT DETECTED   Proteus species NOT DETECTED NOT DETECTED   Serratia marcescens NOT DETECTED NOT DETECTED   Haemophilus influenzae NOT DETECTED NOT DETECTED   Neisseria meningitidis NOT DETECTED NOT DETECTED   Pseudomonas aeruginosa NOT DETECTED NOT DETECTED   Candida albicans NOT DETECTED NOT DETECTED   Candida glabrata NOT DETECTED NOT DETECTED   Candida krusei NOT DETECTED NOT DETECTED   Candida parapsilosis NOT DETECTED NOT DETECTED   Candida tropicalis NOT DETECTED NOT DETECTED    [name] S 10/10/2017  5:31 AM

## 2017-10-10 NOTE — Progress Notes (Deleted)
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   INTERVAL HISTORY: Patient returns to clinic as an add-on after reporting she had nipple discharge at her most recent mammogram. Patient states his only happened once and has not recurred. She otherwise feels well and is asymptomatic. She continues to be highly anxious. She has no neurologic complaints.  She denies any recent fevers. She has a good appetite and denies weight loss. She denies any chest pain or shortness of breath.  She has no nausea, vomiting, constipation, or diarrhea.  She has no urinary complaints.  Patient offers no further specific complaints today.  REVIEW OF SYSTEMS:   Review of Systems  Constitutional: Negative.  Negative for fever, malaise/fatigue and weight loss.  Respiratory: Negative.  Negative for cough and shortness of breath.   Cardiovascular: Negative.  Negative for chest pain and leg swelling.  Gastrointestinal: Negative.  Negative for abdominal pain.  Genitourinary: Negative.   Musculoskeletal: Negative.   Neurological: Negative.  Negative for sensory change and weakness.  Psychiatric/Behavioral: The patient is nervous/anxious.     As per HPI. Otherwise, a complete review of systems is negative.  PAST MEDICAL HISTORY: Past Medical History:  Diagnosis Date  . BRCA negative   . Breast cancer ([place]) 2013   RT LUMPECTOMY with chemo and rad tx  . Cancer ([place])   . Personal history of chemotherapy 2013   for breast ca  . Radiation 2013   BREAST CA  . Status post chemotherapy 2013   BREAST CA    PAST SURGICAL HISTORY: Past  Surgical History:  Procedure Laterality Date  . BREAST BIOPSY Right 07/2011   invasive ductal carcinoma  . 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 RAd and chemo tx  . BREAST SURGERY    . mastectomy Right    partial with lymph node dissection  . PORT A CATH REVISION    . PORT-A-CATH REMOVAL  11-07-15   Dr Bary Castilla  . SPINE SURGERY  2008   Dr Joya Salm    FAMILY HISTORY Family History  Problem Relation Age of Onset  . Diabetes Mother   . Hypertension Mother   . Cancer Paternal Uncle   . Breast cancer Paternal Uncle        40'S  . Breast cancer Paternal Aunt        59'S       ADVANCED DIRECTIVES:    HEALTH MAINTENANCE: Social History   Tobacco Use  . Smoking status: Current Every Day Smoker    Packs/day: 1.00    Types: Cigarettes  . Smokeless tobacco: Never Used  Substance Use Topics  . Alcohol use: Yes    Comment: socially  . Drug use: No     Colonoscopy:  PAP:  Bone density:  Lipid panel:  Allergies  Allergen Reactions  . Penicillins Anaphylaxis  . Sulfa Antibiotics Anaphylaxis  . Ondansetron     Other reaction(s): Other (See Comments) migraines  . Zofran [Ondansetron Hcl]     Current Outpatient Medications  Medication Sig Dispense Refill  . albuterol (PROVENTIL HFA;VENTOLIN HFA) 108 (90 Base) MCG/ACT inhaler Inhale 2 puffs into  the lungs every 4 (four) hours as needed for wheezing or shortness of breath. 1 Inhaler 1  . guaiFENesin-codeine (ROBITUSSIN AC) 100-10 MG/5ML syrup Take 5 mLs by mouth 3 (three) times daily as needed for cough. 120 mL 0  . HYDROcodone-acetaminophen (NORCO) 10-325 MG tablet TAKE 1 TABLET BY MOUTH EVERY 6 HOURS AS NEEDED FOR PAIN 120 tablet 0  . ibuprofen (ADVIL,MOTRIN) 800 MG tablet Take 800 mg by mouth every 6 (six) hours as needed (pain).     Marland Kitchen levofloxacin (LEVAQUIN) 750 MG tablet Take 1 tablet (750 mg total) by mouth daily for 7 days. 7 tablet 0  . nortriptyline (PAMELOR) 10 MG  capsule Take 2 capsules (20 mg total) by mouth at bedtime. 180 capsule 2  . predniSONE (DELTASONE) 10 MG tablet Take 5 tablets (50 mg total) by mouth daily. 25 tablet 0  . zolpidem (AMBIEN) 5 MG tablet Take 1 tablet (5 mg total) by mouth at bedtime as needed. 30 tablet 2   No current facility-administered medications for this visit.     OBJECTIVE: There were no vitals filed for this visit.   There is no height or weight on file to calculate BMI.    ECOG FS:0 - Asymptomatic  General: Well-developed, well-nourished, no acute distress. Eyes: Pink conjunctiva, anicteric sclera. Breasts: Bilateral breast and axilla without lumps or masses. Lungs: Clear to auscultation bilaterally. Heart: Regular rate and rhythm. No rubs, murmurs, or gallops. Abdomen: Soft, nontender, nondistended. No organomegaly noted, normoactive bowel sounds. Musculoskeletal: No edema, cyanosis, or clubbing. Neuro: Alert, answering all questions appropriately. Cranial nerves grossly intact. Skin: No rashes or petechiae noted. Psych: Normal affect.   LAB RESULTS:  Lab Results  Component Value Date   NA 135 10/09/2017   K 3.6 10/09/2017   CL 105 10/09/2017   CO2 22 10/09/2017   GLUCOSE 104 (H) 10/09/2017   BUN 10 10/09/2017   CREATININE 0.60 10/09/2017   CALCIUM 8.9 10/09/2017   PROT 7.0 10/09/2017   ALBUMIN 3.5 10/09/2017   AST 16 10/09/2017   ALT 19 10/09/2017   ALKPHOS 61 10/09/2017   BILITOT 0.6 10/09/2017   GFRNONAA >60 10/09/2017   GFRAA >60 10/09/2017    Lab Results  Component Value Date   WBC 11.9 (H) 10/09/2017   NEUTROABS 9.7 (H) 10/09/2017   HGB 12.7 10/09/2017   HCT 37.7 10/09/2017   MCV 92.9 10/09/2017   PLT 199 10/09/2017   Lab Results  Component Value Date   LABCA2 31.7 10/13/2016     STUDIES: Dg Chest 2 View  Result Date: 10/09/2017 CLINICAL DATA:  Chest pain, cough EXAM: CHEST - 2 VIEW COMPARISON:  02/19/2015 FINDINGS: Interval removal of left Port-A-Cath. Patchy airspace  disease in the right upper and lower lung concerning for pneumonia. Patchy opacity also in the left retrocardiac region. Heart is normal size. No effusions or acute bony abnormality. IMPRESSION: Patchy bilateral airspace opacities concerning for multifocal pneumonia. Electronically Signed   By: Rolm Baptise M.D.   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   PLAN:    1. 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  Patient completed chemotherapy with AC-Taxol in July 2013. She completed adjuvant XRT in October 2013. Patient's most recent mammogram and ultrasound on November 03, 2016 was reported as BI-RADS 2. She does not require breast MRI at this time. She  does not require tamoxifen given the ER/PR status of her tumor. Return to clinic as previously scheduled for routine evaluation. 2.  Peripheral neuropathy: Resolved. 3. Nipple discharge: Unclear etiology. Patient reports it was a one-time occurrence and has not recurred. She did agree that if it occurs again will likely order breast MRI for further evaluation.  Approximately 30 minutes was spent in discussion of which greater than 50% was consultation.  Patient expressed understanding and was in agreement with this plan. She also understands that She can call clinic at any time with any questions, concerns, or complaints.   Lloyd Huger, MD   10/10/2017 11:26 AM

## 2017-10-12 LAB — CULTURE, BLOOD (ROUTINE X 2): Special Requests: ADEQUATE

## 2017-10-13 ENCOUNTER — Inpatient Hospital Stay: Payer: BLUE CROSS/BLUE SHIELD

## 2017-10-13 ENCOUNTER — Emergency Department
Admission: EM | Admit: 2017-10-13 | Discharge: 2017-10-13 | Disposition: A | Discharge status: Home / Self Care | Payer: BLUE CROSS/BLUE SHIELD | Attending: Emergency Medicine | Admitting: Emergency Medicine

## 2017-10-13 ENCOUNTER — Other Ambulatory Visit: Payer: Self-pay

## 2017-10-13 ENCOUNTER — Telehealth: Payer: Self-pay | Admitting: Oncology

## 2017-10-13 ENCOUNTER — Inpatient Hospital Stay: Payer: BLUE CROSS/BLUE SHIELD | Admitting: Oncology

## 2017-10-13 DIAGNOSIS — J189 Pneumonia, unspecified organism: Secondary | ICD-10-CM | POA: Diagnosis not present

## 2017-10-13 DIAGNOSIS — R7881 Bacteremia: Secondary | ICD-10-CM | POA: Diagnosis not present

## 2017-10-13 DIAGNOSIS — Z79899 Other long term (current) drug therapy: Secondary | ICD-10-CM | POA: Diagnosis not present

## 2017-10-13 DIAGNOSIS — J188 Other pneumonia, unspecified organism: Secondary | ICD-10-CM | POA: Diagnosis not present

## 2017-10-13 DIAGNOSIS — Z853 Personal history of malignant neoplasm of breast: Secondary | ICD-10-CM | POA: Insufficient documentation

## 2017-10-13 DIAGNOSIS — R195 Other fecal abnormalities: Secondary | ICD-10-CM | POA: Diagnosis not present

## 2017-10-13 DIAGNOSIS — R799 Abnormal finding of blood chemistry, unspecified: Secondary | ICD-10-CM | POA: Diagnosis not present

## 2017-10-13 LAB — COMPREHENSIVE METABOLIC PANEL
ALK PHOS: 70 U/L (ref 38–126)
ALT: 35 U/L (ref 14–54)
ANION GAP: 10 (ref 5–15)
AST: 26 U/L (ref 15–41)
Albumin: 4.3 g/dL (ref 3.5–5.0)
BILIRUBIN TOTAL: 0.5 mg/dL (ref 0.3–1.2)
BUN: 15 mg/dL (ref 6–20)
CALCIUM: 9.9 mg/dL (ref 8.9–10.3)
CO2: 26 mmol/L (ref 22–32)
CREATININE: 0.67 mg/dL (ref 0.44–1.00)
Chloride: 102 mmol/L (ref 101–111)
GFR calc Af Amer: >60 mL/min (ref >60)
GFR calc non Af Amer: >60 mL/min (ref >60)
Glucose, Bld: 152 mg/dL — ABNORMAL HIGH (ref 65–99)
Potassium: 4.2 mmol/L (ref 3.5–5.1)
Sodium: 138 mmol/L (ref 135–145)
TOTAL PROTEIN: 8.1 g/dL (ref 6.5–8.1)

## 2017-10-13 LAB — CBC
HEMATOCRIT: 42.1 % (ref 35.0–47.0)
HEMOGLOBIN: 14.6 g/dL (ref 12.0–16.0)
MCH: 32.4 pg (ref 26.0–34.0)
MCHC: 34.6 g/dL (ref 32.0–36.0)
MCV: 93.5 fL (ref 80.0–100.0)
Platelets: 334 10*3/uL (ref 150–440)
RBC: 4.51 MIL/uL (ref 3.80–5.20)
RDW: 14.6 % — ABNORMAL HIGH (ref 11.5–14.5)
WBC: 10.7 10*3/uL (ref 3.6–11.0)

## 2017-10-13 NOTE — Progress Notes (Signed)
ER Culture report showing viridans streptococcus in 2 of 4 bottles (single set). Patient was discharged on levofloxacin 750 mg PO Q24H x 7 days (this is day 5). Left message with [facility] (PCP is Dr. Otilio Miu according to our records) to make them aware of culture results. Also left message for patient to call back regarding results.  10/13/17 17:55 Spoke with Ms. Hemp - she's still not feeling well. She spoke with her PCP and was told to go back to ED to be worked up for sepsis. I told her I would let ED know she was coming back, and spoke with Annie Main in ED to make him aware patient is coming back this evening.   Jamaury Gumz A. [place], [place].D., BCPS Clinical Pharmacist 10/13/17 14:54

## 2017-10-13 NOTE — ED Notes (Signed)
Spoke with Der Jacqualine Code regarding pt's presenting s/x's and c/o's. Per Dr Jacqualine Code, Elizabeth Torres for CBC and CMP lab work; no imaging or other lab testing required at this time.

## 2017-10-13 NOTE — Telephone Encounter (Signed)
2nd attempt to contact patient to r\s appt for 10/13/17.

## 2017-10-13 NOTE — ED Triage Notes (Signed)
Pt arrives to ED via POV from home with c/o abnormal lab results. Pt states she was called and instructed to come to the ED d/t blood culture results that were collected Saturday (4 days ago). Pt reports "still hurts to breath" but "not as bad as when I came in Saturday". Pt reports being d/x'd with bilateral pneumonia and r/x'd Levaquin 750mg  once daily. Pt is A&O, in NAD; RR even, regular, and unlabored.

## 2017-10-13 NOTE — ED Provider Notes (Signed)
[facility] Emergency Department Provider Note  ___________________________________________   First MD Initiated Contact with Patient 10/13/17 2123     (approximate)  I have reviewed the triage vital signs and the nursing notes.   HISTORY  Chief Complaint Abnormal Lab   HPI Elizabeth Torres is a 42 y.o. female who was seen in the emergency department on March 9 for multifocal pneumonia now on Levaquin who was called back into the emergency department for positive blood cultures.  Reports that since starting on Levaquin on the ninth that she has had great improvement with less shortness of breath, coughing and pain.  Patient also is no longer having fevers.  Patient's primary care doctor was concerned about the contaminants as well as the patient's persistent chest pain and recommended that she be sent to the emergency department for evaluation this evening.  Past Medical History:  Diagnosis Date  . BRCA negative   . Breast cancer ([place]) 2013   RT LUMPECTOMY with chemo and rad tx  . Cancer ([place])   . Personal history of chemotherapy 2013   for breast ca  . Radiation 2013   BREAST CA  . Status post chemotherapy 2013   BREAST CA    Patient Active Problem List   Diagnosis Date Noted  . Cervical myofascial pain syndrome 02/14/2016  . History of breast cancer 11/07/2015  . Postlaminectomy syndrome, lumbar region 04/12/2012  . 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 ([place]) 08/07/2011    Past Surgical History:  Procedure Laterality Date  . BREAST BIOPSY Right 07/2011   invasive ductal carcinoma  . 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 RAd and chemo tx  . BREAST SURGERY    . mastectomy Right    partial with lymph node dissection  . PORT A CATH REVISION    . PORT-A-CATH REMOVAL  11-07-15   Dr Bary Castilla  . SPINE SURGERY  2008   Dr Joya Salm      Prior to Admission medications   Medication Sig Start Date End Date Taking? Authorizing Provider  albuterol (PROVENTIL HFA;VENTOLIN HFA) 108 (90 Base) MCG/ACT inhaler Inhale 2 puffs into the lungs every 4 (four) hours as needed for wheezing or shortness of breath. 10/09/17   Triplett, Cari B, FNP  guaiFENesin-codeine (ROBITUSSIN AC) 100-10 MG/5ML syrup Take 5 mLs by mouth 3 (three) times daily as needed for cough. 10/09/17   Triplett, Johnette Abraham B, FNP  HYDROcodone-acetaminophen (NORCO) 10-325 MG tablet TAKE 1 TABLET BY MOUTH EVERY 6 HOURS AS NEEDED FOR PAIN 10/06/17   Bayard Hugger, NP  ibuprofen (ADVIL,MOTRIN) 800 MG tablet Take 800 mg by mouth every 6 (six) hours as needed (pain).  04/19/13   Prueter, Santiago Glad, PA-C  levofloxacin (LEVAQUIN) 750 MG tablet Take 1 tablet (750 mg total) by mouth daily for 7 days. 10/09/17 10/16/17  Triplett, Johnette Abraham B, FNP  nortriptyline (PAMELOR) 10 MG capsule Take 2 capsules (20 mg total) by mouth at bedtime. 08/06/17   Bayard Hugger, NP  predniSONE (DELTASONE) 10 MG tablet Take 5 tablets (50 mg total) by mouth daily. 10/09/17   Triplett, Cari B, FNP  zolpidem (AMBIEN) 5 MG tablet Take 1 tablet (5 mg total) by mouth at bedtime as needed. 07/07/17   Bayard Hugger, NP    Allergies Penicillins; Sulfa antibiotics; Ondansetron; and Zofran [ondansetron hcl]  Family History  Problem Relation Age of Onset  .  Diabetes Mother   . Hypertension Mother   . Cancer Paternal Uncle   . Breast cancer Paternal Uncle        40'S  . Breast cancer Paternal Aunt        74'S    Social History Social History   Tobacco Use  . Smoking status: Current Every Day Smoker    Packs/day: 1.00    Types: Cigarettes  . Smokeless tobacco: Never Used  Substance Use Topics  . Alcohol use: Yes    Comment: socially  . Drug use: No    Review of Systems  Constitutional: No fever/chills Eyes: No visual changes. ENT: No sore throat. Cardiovascular: As above Respiratory: As  above Gastrointestinal: No abdominal pain.  No nausea, no vomiting.  No diarrhea.  No constipation. Genitourinary: Negative for dysuria. Musculoskeletal: Negative for back pain. Skin: Negative for rash. Neurological: Negative for headaches, focal weakness or numbness.   ____________________________________________   PHYSICAL EXAM:  VITAL SIGNS: ED Triage Vitals [10/13/17 1928]  Enc Vitals Group     BP (!) 139/95     Pulse Rate (!) 102     Resp 18     Temp 98.4 F (36.9 C)     Temp Source Oral     SpO2 100 %     Weight 155 lb (70.3 kg)     Height '5\' 3"'  (1.6 m)     Head Circumference      Peak Flow      Pain Score 3     Pain Loc      Pain Edu?      Excl. in [place]?     Constitutional: Alert and oriented. Well appearing and in no acute distress. Eyes: Conjunctivae are normal.  Head: Atraumatic. Nose: No congestion/rhinnorhea. Mouth/Throat: Mucous membranes are moist.  Neck: No stridor.   Cardiovascular: Normal rate, regular rhythm. Grossly normal heart sounds.  Heart rate of 91 bpm in the room. Respiratory: Normal respiratory effort.  No retractions.  Minimal rales that are scattered, bilaterally. Gastrointestinal: Soft and nontender. No distention. No CVA tenderness. Musculoskeletal: No lower extremity tenderness nor edema.  No joint effusions. Neurologic:  Normal speech and language. No gross focal neurologic deficits are appreciated. Skin:  Skin is warm, dry and intact. No rash noted. Psychiatric: Mood and affect are normal. Speech and behavior are normal.  ____________________________________________   LABS (all labs ordered are listed, but only abnormal results are displayed)  Labs Reviewed  CBC - Abnormal; Notable for the following components:      Result Value   RDW 14.6 (*)    All other components within normal limits  COMPREHENSIVE METABOLIC PANEL - Abnormal; Notable for the following components:   Glucose, Bld 152 (*)    All other components within normal  limits   ____________________________________________  EKG   ____________________________________________  RADIOLOGY   ____________________________________________   PROCEDURES  Procedure(s) performed:   Procedures  Critical Care performed:   ____________________________________________   INITIAL IMPRESSION / ASSESSMENT AND PLAN / ED COURSE  Pertinent labs & imaging results that were available during my care of the patient were reviewed by me and considered in my medical decision making (see chart for details).  Differential diagnosis includes, but is not limited to, ACS, aortic dissection, pulmonary embolism, cardiac tamponade, pneumothorax, pneumonia, pericarditis, myocarditis, GI-related causes including esophagitis/gastritis, and musculoskeletal chest wall pain.   As part of my medical decision making, I reviewed the following data within the electronic MEDICAL RECORD NUMBER Notes from  prior ED visits  Patient overall seems to be improving on her antibiotics.  Multiple species of strep make me think that the blood cultures are contaminated.  Patient will continue to take her Levaquin as prescribed through this Friday.  Patient has a follow-up appoint with her primary care doctor tomorrow.  I would expect the patient to have a continued, mild degree of pain as well as possible rales that she is presenting with today. ____________________________________________   FINAL CLINICAL IMPRESSION(S) / ED DIAGNOSES  Positive blood culture.  Multifocal pneumonia.    NEW MEDICATIONS STARTED DURING THIS VISIT:  New Prescriptions   No medications on file     Note:  This document was prepared using Dragon voice recognition software and may include unintentional dictation errors.     Orbie Pyo, MD 10/13/17 2217

## 2017-10-14 ENCOUNTER — Ambulatory Visit: Payer: Self-pay | Admitting: Family Medicine

## 2017-10-14 LAB — CULTURE, BLOOD (ROUTINE X 2)
CULTURE: NO GROWTH
SPECIAL REQUESTS: ADEQUATE

## 2017-10-24 NOTE — Progress Notes (Signed)
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   INTERVAL HISTORY: Patient returns to clinic today for routine yearly follow-up.  She recently had pneumonia with positive blood cultures, but this is now resolved and she is back to her baseline.  She currently feels well and is asymptomatic. She has no neurologic complaints.  She denies any recent fevers. She has a good appetite and denies weight loss. She denies any chest pain or shortness of breath.  She has no nausea, vomiting, constipation, or diarrhea.  She has no urinary complaints.  Patient offers no further specific complaints today.  REVIEW OF SYSTEMS:   Review of Systems  Constitutional: Negative.  Negative for fever, malaise/fatigue and weight loss.  Respiratory: Negative.  Negative for cough and shortness of breath.   Cardiovascular: Negative.  Negative for chest pain and leg swelling.  Gastrointestinal: Negative.  Negative for abdominal pain.  Genitourinary: Negative.   Musculoskeletal: Negative.   Skin: Negative.  Negative for rash.  Neurological: Negative.  Negative for sensory change, focal weakness and weakness.  Psychiatric/Behavioral: The patient is nervous/anxious.     As per HPI. Otherwise, a complete review of systems is negative.  PAST MEDICAL HISTORY: Past Medical History:  Diagnosis Date  . BRCA negative   . Breast cancer ([place]) 2013   RT LUMPECTOMY with chemo and rad tx  . Cancer ([place])   . Personal history of chemotherapy 2013   for breast ca  . Radiation 2013   BREAST CA  . Status post chemotherapy 2013   BREAST CA    PAST  SURGICAL HISTORY: Past Surgical History:  Procedure Laterality Date  . BREAST BIOPSY Right 07/2011   invasive ductal carcinoma  . 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 RAd and chemo tx  . BREAST SURGERY    . mastectomy Right    partial with lymph node dissection  . PORT A CATH REVISION    . PORT-A-CATH REMOVAL  11-07-15   Dr Bary Castilla  . SPINE SURGERY  2008   Dr Joya Salm    FAMILY HISTORY Family History  Problem Relation Age of Onset  . Diabetes Mother   . Hypertension Mother   . Cancer Paternal Uncle   . Breast cancer Paternal Uncle        40'S  . Breast cancer Paternal Aunt        12'S       ADVANCED DIRECTIVES:    HEALTH MAINTENANCE: Social History   Tobacco Use  . Smoking status: Current Some Day Smoker    Packs/day: 1.00    Types: Cigarettes  . Smokeless tobacco: Never Used  Substance Use Topics  . Alcohol use: Yes    Comment: socially  . Drug use: No     Colonoscopy:  PAP:  Bone density:  Lipid panel:  Allergies  Allergen Reactions  . Penicillins Anaphylaxis  . Sulfa Antibiotics Anaphylaxis  . Ondansetron     Other reaction(s): Other (See Comments) migraines  . Zofran [Ondansetron Hcl]     Current Outpatient Medications  Medication Sig Dispense Refill  . HYDROcodone-acetaminophen (NORCO) 10-325 MG tablet TAKE 1  TABLET BY MOUTH EVERY 6 HOURS AS NEEDED FOR PAIN 120 tablet 0  . ibuprofen (ADVIL,MOTRIN) 800 MG tablet Take 800 mg by mouth every 6 (six) hours as needed (pain).     . nortriptyline (PAMELOR) 10 MG capsule Take 2 capsules (20 mg total) by mouth at bedtime. 180 capsule 2  . zolpidem (AMBIEN) 5 MG tablet Take 1 tablet (5 mg total) by mouth at bedtime as needed. 30 tablet 2   No current facility-administered medications for this visit.     OBJECTIVE: Vitals:   10/26/17 1107 10/26/17 1111  BP: 114/76   Pulse: 90   Temp: 98 F (36.7 C)   SpO2:  96%     Body mass index is 27.99 kg/m.     ECOG FS:0 - Asymptomatic  General: Well-developed, well-nourished, no acute distress. Eyes: Pink conjunctiva, anicteric sclera. Breasts: Bilateral breast and axilla without lumps or masses. Lungs: Clear to auscultation bilaterally. Heart: Regular rate and rhythm. No rubs, murmurs, or gallops. Abdomen: Soft, nontender, nondistended. No organomegaly noted, normoactive bowel sounds. Musculoskeletal: No edema, cyanosis, or clubbing. Neuro: Alert, answering all questions appropriately. Cranial nerves grossly intact. Skin: No rashes or petechiae noted. Psych: Normal affect.   LAB RESULTS:  Lab Results  Component Value Date   NA 138 10/13/2017   K 4.2 10/13/2017   CL 102 10/13/2017   CO2 26 10/13/2017   GLUCOSE 152 (H) 10/13/2017   BUN 15 10/13/2017   CREATININE 0.67 10/13/2017   CALCIUM 9.9 10/13/2017   PROT 8.1 10/13/2017   ALBUMIN 4.3 10/13/2017   AST 26 10/13/2017   ALT 35 10/13/2017   ALKPHOS 70 10/13/2017   BILITOT 0.5 10/13/2017   GFRNONAA >60 10/13/2017   GFRAA >60 10/13/2017    Lab Results  Component Value Date   WBC 10.7 10/13/2017   NEUTROABS 9.7 (H) 10/09/2017   HGB 14.6 10/13/2017   HCT 42.1 10/13/2017   MCV 93.5 10/13/2017   PLT 334 10/13/2017     STUDIES: Dg Chest 2 View  Result Date: 10/09/2017 CLINICAL DATA:  Chest pain, cough EXAM: CHEST - 2 VIEW COMPARISON:  02/19/2015 FINDINGS: Interval removal of left Port-A-Cath. Patchy airspace disease in the right upper and lower lung concerning for pneumonia. Patchy opacity also in the left retrocardiac region. Heart is normal size. No effusions or acute bony abnormality. IMPRESSION: Patchy bilateral airspace opacities concerning for multifocal pneumonia. Electronically Signed   By: Rolm Baptise M.D.   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   PLAN:    1. Triple negative stage Ia invasive carcinoma of the lower outer  quadrant of the right breast: BRCA negative.  Patient completed chemotherapy with AC-Taxol in July 2013. She completed adjuvant XRT in October 2013. Patient's most recent mammogram and ultrasound on November 03, 2016 was reported as BI-RADS 2.  Repeat in April 2019.  Her CA-27-29 continues to be within normal limits at 36.9.  She does not require breast MRI at this time. She does not require tamoxifen given the ER/PR status of her tumor. Return to clinic in 1 year for routine evaluation.   2.  Pneumonia/positive blood cultures: Resolved.  Approximately 20 minutes was spent in discussion of which greater than 50% was consultation.  Patient expressed understanding and was in agreement with this plan. She also understands that She can call clinic at any time with any questions, concerns, or complaints.  Lloyd Huger, MD   10/27/2017 4:55 PM

## 2017-10-25 ENCOUNTER — Other Ambulatory Visit: Payer: Self-pay | Admitting: *Deleted

## 2017-10-25 DIAGNOSIS — C50919 Malignant neoplasm of unspecified site of unspecified female breast: Secondary | ICD-10-CM

## 2017-10-26 ENCOUNTER — Inpatient Hospital Stay: Payer: BLUE CROSS/BLUE SHIELD | Attending: Oncology | Admitting: Oncology

## 2017-10-26 ENCOUNTER — Inpatient Hospital Stay: Payer: BLUE CROSS/BLUE SHIELD

## 2017-10-26 ENCOUNTER — Other Ambulatory Visit: Payer: Self-pay

## 2017-10-26 VITALS — BP 114/76 | HR 90 | Temp 98.0°F | Wt 158.0 lb

## 2017-10-26 DIAGNOSIS — Z171 Estrogen receptor negative status [ER-]: Secondary | ICD-10-CM | POA: Diagnosis not present

## 2017-10-26 DIAGNOSIS — C50919 Malignant neoplasm of unspecified site of unspecified female breast: Secondary | ICD-10-CM

## 2017-10-26 DIAGNOSIS — C50511 Malignant neoplasm of lower-outer quadrant of right female breast: Secondary | ICD-10-CM | POA: Insufficient documentation

## 2017-10-26 DIAGNOSIS — F17210 Nicotine dependence, cigarettes, uncomplicated: Secondary | ICD-10-CM | POA: Diagnosis not present

## 2017-10-26 NOTE — Progress Notes (Signed)
Patient here today for follow up.   

## 2017-10-27 ENCOUNTER — Ambulatory Visit (INDEPENDENT_AMBULATORY_CARE_PROVIDER_SITE_OTHER): Payer: BLUE CROSS/BLUE SHIELD | Admitting: Family Medicine

## 2017-10-27 ENCOUNTER — Encounter: Payer: Self-pay | Admitting: Family Medicine

## 2017-10-27 VITALS — BP 120/80 | HR 84 | Ht 63.0 in | Wt 160.0 lb

## 2017-10-27 DIAGNOSIS — J189 Pneumonia, unspecified organism: Secondary | ICD-10-CM

## 2017-10-27 DIAGNOSIS — Z171 Estrogen receptor negative status [ER-]: Secondary | ICD-10-CM

## 2017-10-27 DIAGNOSIS — Z23 Encounter for immunization: Secondary | ICD-10-CM | POA: Diagnosis not present

## 2017-10-27 DIAGNOSIS — F17210 Nicotine dependence, cigarettes, uncomplicated: Secondary | ICD-10-CM

## 2017-10-27 DIAGNOSIS — C50911 Malignant neoplasm of unspecified site of right female breast: Secondary | ICD-10-CM

## 2017-10-27 LAB — CANCER ANTIGEN 27.29: CAN 27.29: 36.9 U/mL (ref 0.0–38.6)

## 2017-10-27 NOTE — Progress Notes (Signed)
Name: Elizabeth Torres   MRN: [id number]    DOB: 01-04-1976   Date:10/27/2017       Progress Note  Subjective  Chief Complaint  Chief Complaint  Patient presents with  . Follow-up    er visit-dx pneumonia- finished with antibiotics    Pneumonia  She complains of cough. There is no chest tightness, difficulty breathing, frequent throat clearing, hemoptysis, hoarse voice, shortness of breath, sputum production or wheezing. Primary symptoms comments: pneumonia. This is a chronic problem. The current episode started 1 to 4 weeks ago. The problem has been waxing and waning. The cough is non-productive. Associated symptoms include chest pain, dyspnea on exertion, a fever and postnasal drip. Pertinent negatives include no appetite change, ear congestion, ear pain, headaches, heartburn, malaise/fatigue, myalgias, nasal congestion, orthopnea, PND, rhinorrhea, sneezing, sore throat, sweats, trouble swallowing or weight loss. Associated symptoms comments: Pain with inspiration. Her symptoms are aggravated by exposure to smoke. Her symptoms are alleviated by beta-agonist (levaquin/albuterol inhaler). Risk factors for lung disease include smoking/tobacco exposure. Her past medical history is significant for pneumonia.    No problem-specific Assessment & Plan notes found for this encounter.   Past Medical History:  Diagnosis Date  . BRCA negative   . Breast cancer ([place]) 2013   RT LUMPECTOMY with chemo and rad tx  . Cancer ([place])   . Personal history of chemotherapy 2013   for breast ca  . Radiation 2013   BREAST CA  . Status post chemotherapy 2013   BREAST CA    Past Surgical History:  Procedure Laterality Date  . BREAST BIOPSY Right 07/2011   invasive ductal carcinoma  . 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 RAd and chemo tx  . BREAST SURGERY    . mastectomy Right    partial with lymph node dissection  . PORT A CATH REVISION    . PORT-A-CATH  REMOVAL  11-07-15   Dr Bary Castilla  . SPINE SURGERY  2008   Dr Joya Salm    Family History  Problem Relation Age of Onset  . Diabetes Mother   . Hypertension Mother   . Cancer Paternal Uncle   . Breast cancer Paternal Uncle        40'S  . Breast cancer Paternal Aunt        31'S    Social History   Socioeconomic History  . Marital status: Married    Spouse name: Not on file  . Number of children: Not on file  . Years of education: Not on file  . Highest education level: Not on file  Occupational History  . Not on file  Social Needs  . Financial resource strain: Not on file  . Food insecurity:    Worry: Not on file    Inability: Not on file  . Transportation needs:    Medical: Not on file    Non-medical: Not on file  Tobacco Use  . Smoking status: Current Some Day Smoker    Packs/day: 1.00    Types: Cigarettes  . Smokeless tobacco: Never Used  Substance and Sexual Activity  . Alcohol use: Yes    Comment: socially  . Drug use: No  . Sexual activity: Not on file  Lifestyle  . Physical activity:    Days per week: Not on file    Minutes per session: Not on file  . Stress: Not on file  Relationships  . Social connections:    Talks on phone:  Not on file    Gets together: Not on file    Attends religious service: Not on file    Active member of club or organization: Not on file    Attends meetings of clubs or organizations: Not on file    Relationship status: Not on file  . Intimate partner violence:    Fear of current or ex partner: Not on file    Emotionally abused: Not on file    Physically abused: Not on file    Forced sexual activity: Not on file  Other Topics Concern  . Not on file  Social History Narrative  . Not on file    Allergies  Allergen Reactions  . Penicillins Anaphylaxis  . Sulfa Antibiotics Anaphylaxis  . Ondansetron     Other reaction(s): Other (See Comments) migraines  . Zofran [Ondansetron Hcl]     Outpatient Medications Prior to Visit   Medication Sig Dispense Refill  . HYDROcodone-acetaminophen (NORCO) 10-325 MG tablet TAKE 1 TABLET BY MOUTH EVERY 6 HOURS AS NEEDED FOR PAIN 120 tablet 0  . ibuprofen (ADVIL,MOTRIN) 800 MG tablet Take 800 mg by mouth every 6 (six) hours as needed (pain).     . nortriptyline (PAMELOR) 10 MG capsule Take 2 capsules (20 mg total) by mouth at bedtime. 180 capsule 2  . zolpidem (AMBIEN) 5 MG tablet Take 1 tablet (5 mg total) by mouth at bedtime as needed. 30 tablet 2   No facility-administered medications prior to visit.     Review of Systems  Constitutional: Positive for fever. Negative for appetite change, chills, malaise/fatigue and weight loss.  HENT: Positive for postnasal drip. Negative for ear discharge, ear pain, hoarse voice, rhinorrhea, sneezing, sore throat and trouble swallowing.   Eyes: Negative for blurred vision.  Respiratory: Positive for cough. Negative for hemoptysis, sputum production, shortness of breath and wheezing.   Cardiovascular: Positive for chest pain and dyspnea on exertion. Negative for palpitations, leg swelling and PND.  Gastrointestinal: Negative for abdominal pain, blood in stool, constipation, diarrhea, heartburn, melena and nausea.  Genitourinary: Negative for dysuria, frequency, hematuria and urgency.  Musculoskeletal: Negative for back pain, joint pain, myalgias and neck pain.  Skin: Negative for rash.  Neurological: Negative for dizziness, tingling, sensory change, focal weakness and headaches.  Endo/Heme/Allergies: Negative for environmental allergies and polydipsia. Does not bruise/bleed easily.  Psychiatric/Behavioral: Negative for depression and suicidal ideas. The patient is not nervous/anxious and does not have insomnia.      Objective  Vitals:   10/27/17 1027  BP: 120/80  Pulse: 84  SpO2: 96%  Weight: 160 lb (72.6 kg)  Height: '5\' 3"'  (1.6 m)    Physical Exam  Constitutional: She is well-developed, well-nourished, and in no distress. No  distress.  HENT:  Head: Normocephalic and atraumatic.  Right Ear: External ear normal.  Left Ear: External ear normal.  Nose: Nose normal.  Mouth/Throat: Oropharynx is clear and moist.  Eyes: Pupils are equal, round, and reactive to light. Conjunctivae and EOM are normal. Right eye exhibits no discharge. Left eye exhibits no discharge.  Neck: Normal range of motion. Neck supple. No JVD present. No thyromegaly present.  Cardiovascular: Normal rate, regular rhythm, normal heart sounds and intact distal pulses. Exam reveals no gallop and no friction rub.  No murmur heard. Pulmonary/Chest: Effort normal. No accessory muscle usage. No respiratory distress. She has decreased breath sounds in the right upper field. She has no wheezes. She has no rales.  Abdominal: Soft. Bowel sounds are normal.  She exhibits no mass. There is no tenderness. There is no guarding.  Musculoskeletal: Normal range of motion. She exhibits no edema.  Lymphadenopathy:    She has no cervical adenopathy.  Neurological: She is alert. She has normal reflexes.  Skin: Skin is warm and dry. She is not diaphoretic.  Psychiatric: Mood and affect normal.  Nursing note and vitals reviewed.     Assessment & Plan  Problem List Items Addressed This Visit    None    Visit Diagnoses    Multifocal pneumonia    -  Primary   2 week follow up   Malignant neoplasm of right breast in female, estrogen receptor negative, unspecified site of breast ([place])       Need for diphtheria-tetanus-pertussis (Tdap) vaccine       Relevant Orders   Tdap vaccine greater than or equal to 7yo IM (Completed)   Cigarette nicotine dependence without complication       Need for 23-polyvalent pneumococcal polysaccharide vaccine       hx breast cancer- treated with chemotherapy currently   Relevant Orders   Pneumococcal polysaccharide vaccine 23-valent greater than or equal to 2yo subcutaneous/IM (Completed)      No orders of the defined types were  placed in this encounter.     Dr. Macon [facility]  10/27/17

## 2017-10-28 ENCOUNTER — Other Ambulatory Visit: Payer: Self-pay

## 2017-10-28 MED ORDER — ZOLPIDEM TARTRATE 5 MG PO TABS: 5.0000 mg | ORAL_TABLET | Freq: Every evening | ORAL | 2 refills | Status: DC | PRN

## 2017-10-28 NOTE — Telephone Encounter (Signed)
Was accidentally printed.  Medication called into pharmacy.

## 2017-11-02 ENCOUNTER — Encounter: Payer: BLUE CROSS/BLUE SHIELD | Attending: Physical Medicine and Rehabilitation

## 2017-11-02 ENCOUNTER — Encounter: Payer: Self-pay | Admitting: Physical Medicine & Rehabilitation

## 2017-11-02 ENCOUNTER — Ambulatory Visit (HOSPITAL_BASED_OUTPATIENT_CLINIC_OR_DEPARTMENT_OTHER): Payer: BLUE CROSS/BLUE SHIELD | Admitting: Physical Medicine & Rehabilitation

## 2017-11-02 VITALS — BP 116/82 | HR 99 | Resp 14 | Ht 63.0 in | Wt 158.0 lb

## 2017-11-02 DIAGNOSIS — Z760 Encounter for issue of repeat prescription: Secondary | ICD-10-CM | POA: Diagnosis not present

## 2017-11-02 DIAGNOSIS — M961 Postlaminectomy syndrome, not elsewhere classified: Secondary | ICD-10-CM | POA: Diagnosis not present

## 2017-11-02 DIAGNOSIS — G620 Drug-induced polyneuropathy: Secondary | ICD-10-CM | POA: Diagnosis not present

## 2017-11-02 DIAGNOSIS — M79604 Pain in right leg: Secondary | ICD-10-CM | POA: Diagnosis not present

## 2017-11-02 DIAGNOSIS — Z79891 Long term (current) use of opiate analgesic: Secondary | ICD-10-CM

## 2017-11-02 DIAGNOSIS — F17210 Nicotine dependence, cigarettes, uncomplicated: Secondary | ICD-10-CM | POA: Diagnosis not present

## 2017-11-02 DIAGNOSIS — T451X5S Adverse effect of antineoplastic and immunosuppressive drugs, sequela: Secondary | ICD-10-CM | POA: Insufficient documentation

## 2017-11-02 DIAGNOSIS — M545 Low back pain: Secondary | ICD-10-CM | POA: Insufficient documentation

## 2017-11-02 DIAGNOSIS — G894 Chronic pain syndrome: Secondary | ICD-10-CM | POA: Diagnosis not present

## 2017-11-02 DIAGNOSIS — M79605 Pain in left leg: Secondary | ICD-10-CM | POA: Insufficient documentation

## 2017-11-02 DIAGNOSIS — Z5181 Encounter for therapeutic drug level monitoring: Secondary | ICD-10-CM

## 2017-11-02 DIAGNOSIS — G8929 Other chronic pain: Secondary | ICD-10-CM | POA: Diagnosis not present

## 2017-11-02 DIAGNOSIS — Z9221 Personal history of antineoplastic chemotherapy: Secondary | ICD-10-CM | POA: Insufficient documentation

## 2017-11-02 MED ORDER — HYDROCODONE-ACETAMINOPHEN 10-325 MG PO TABS: ORAL_TABLET | ORAL | 0 refills | Status: DC

## 2017-11-02 NOTE — Progress Notes (Signed)
Subjective:    Patient ID: Elizabeth Torres, female    DOB: February 23, 1976, 42 y.o.   MRN: [id number]  HPI 42 year old female with history of lumbar postlaminectomy syndrome primarily with axial low back pain.  In addition she does have history of chemo related neuropathy which has gradually improved over time.  She did not get good relief with gabapentin or Lyrica.  She was trialed on nortriptyline.  Recently she stopped this for about 1 week and did not note any worsening of her neuropathic pain symptoms. Patient without new pain issues.  She continues have chronic low back pain which is well relieved by her medication management at the current time.  She has had no signs of aberrant drug behavior.  She has been on consistent monitoring program for several years. No new medical issues  Patient is very busy around the house.  She also works full-time  No constipation from hydrocodone, takes laxatives once a month Pain Inventory Average Pain 4 Pain Right Now 4 My pain is constant, dull and aching  In the last 24 hours, has pain interfered with the following? General activity 4 Relation with others 4 Enjoyment of life 4 What TIME of day is your pain at its worst? evening Sleep (in general) Fair  Pain is worse with: sitting, inactivity and standing Pain improves with: rest and medication Relief from Meds: no selection  Mobility walk without assistance Do you have any goals in this area?  no  Function Do you have any goals in this area?  no  Neuro/Psych No problems in this area  Prior Studies Any changes since last visit?  no  Physicians involved in your care Any changes since last visit?  no   Family History  Problem Relation Age of Onset  . Diabetes Mother   . Hypertension Mother   . Cancer Paternal Uncle   . Breast cancer Paternal Uncle        40'S  . Breast cancer Paternal Aunt        70'S   Social History   Socioeconomic History  . Marital status: Married   Spouse name: Not on file  . Number of children: Not on file  . Years of education: Not on file  . Highest education level: Not on file  Occupational History  . Not on file  Social Needs  . Financial resource strain: Not on file  . Food insecurity:    Worry: Not on file    Inability: Not on file  . Transportation needs:    Medical: Not on file    Non-medical: Not on file  Tobacco Use  . Smoking status: Current Some Day Smoker    Packs/day: 1.00    Types: Cigarettes  . Smokeless tobacco: Never Used  Substance and Sexual Activity  . Alcohol use: Yes    Comment: socially  . Drug use: No  . Sexual activity: Not on file  Lifestyle  . Physical activity:    Days per week: Not on file    Minutes per session: Not on file  . Stress: Not on file  Relationships  . Social connections:    Talks on phone: Not on file    Gets together: Not on file    Attends religious service: Not on file    Active member of club or organization: Not on file    Attends meetings of clubs or organizations: Not on file    Relationship status: Not on file  Other Topics  Concern  . Not on file  Social History Narrative  . Not on file   Past Surgical History:  Procedure Laterality Date  . BREAST BIOPSY Right 07/2011   invasive ductal carcinoma  . 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 RAd and chemo tx  . BREAST SURGERY    . mastectomy Right    partial with lymph node dissection  . PORT A CATH REVISION    . PORT-A-CATH REMOVAL  11-07-15   Dr Bary Castilla  . SPINE SURGERY  2008   Dr Joya Salm   Past Medical History:  Diagnosis Date  . BRCA negative   . Breast cancer ([place]) 2013   RT LUMPECTOMY with chemo and rad tx  . Cancer ([place])   . Personal history of chemotherapy 2013   for breast ca  . Radiation 2013   BREAST CA  . Status post chemotherapy 2013   BREAST CA   BP 116/82 (BP Location: Left Arm, Patient Position: Sitting, Cuff Size: Normal)   Pulse 99    Resp 14   Ht _0  (1.6 m)   Wt 158 lb (71.7 kg)   SpO2 92%   BMI 27.99 kg/m   Opioid Risk Score:   Fall Risk Score:  `1  Depression screen PHQ 2/9  Depression screen [facility] 2/9 09/08/2017 04/06/2017 03/03/2017 03/13/2016 10/10/2015 08/15/2015 04/17/2015  Decreased Interest 0 0 0 0 0 0 0  Down, Depressed, Hopeless 0 0 0 0 0 0 0  PHQ - 2 Score 0 0 0 0 0 0 0  Altered sleeping - - - - 2 - -  Tired, decreased energy - - - - 1 - -  Change in appetite - - - - 0 - -  Feeling bad or failure about yourself  - - - - 0 - -  Trouble concentrating - - - - 0 - -  Moving slowly or fidgety/restless - - - - 0 - -  Suicidal thoughts - - - - - - -  PHQ-9 Score - - - - 3 - -  Difficult doing work/chores - - - - Not difficult at all - -    Review of Systems  Constitutional: Negative.   HENT: Negative.   Eyes: Negative.   Respiratory: Positive for cough, shortness of breath and wheezing.   Cardiovascular: Negative.   Gastrointestinal: Negative.   Endocrine: Negative.   Genitourinary: Negative.   Musculoskeletal: Positive for arthralgias, back pain and myalgias.  Skin: Negative.   Neurological: Negative.   Hematological: Negative.        Objective:   Physical Exam  Constitutional: She is oriented to person, place, and time. She appears well-developed and well-nourished. No distress.  HENT:  Head: Normocephalic and atraumatic.  Eyes: Pupils are equal, round, and reactive to light. Conjunctivae and EOM are normal.  Neck: Normal range of motion.  Musculoskeletal:  Lumbar spine range of motion 75% range extension and flexion, 75% lateral bending to the left into the right as well as twisting.  Negative straight leg raising test  Neurological: She is alert and oriented to person, place, and time. Coordination normal.  Motor strength is 5/5 bilateral deltoid bicep tricep grip hip flexor knee extensor ankle dorsiflexor  Skin: Skin is warm. She is not diaphoretic.  Nursing note and vitals  reviewed.         Assessment & Plan:  1.  Lumbar postlaminectomy syndrome encouraged activity, patient busy between job as well as  taking care of her family.  Nurse practitioner visit in 1 month I do not see any indication for interventional pain procedures at the current time  Indication for chronic opioid: Lumbar post laminectomy syndrome Medication and dose: Hydrocodone 54m QID # pills per month: 120 Last UDS date: 11/02/17 Opioid Treatment Agreement signed (Y/N): y Opioid Treatment Agreement last reviewed with patient:   [facility] reviewed this encounter (include red flags):  11/02/17

## 2017-11-06 LAB — TOXASSURE SELECT,+ANTIDEPR,UR

## 2017-11-09 ENCOUNTER — Telehealth: Payer: Self-pay | Admitting: *Deleted

## 2017-11-09 NOTE — Telephone Encounter (Signed)
Urine drug screen for this encounter is consistent for prescribed medication 

## 2017-11-10 ENCOUNTER — Ambulatory Visit: Payer: BLUE CROSS/BLUE SHIELD | Admitting: Family Medicine

## 2017-11-19 ENCOUNTER — Other Ambulatory Visit: Payer: BLUE CROSS/BLUE SHIELD

## 2017-12-02 ENCOUNTER — Encounter: Payer: BLUE CROSS/BLUE SHIELD | Attending: Physical Medicine and Rehabilitation | Admitting: Registered Nurse

## 2017-12-02 ENCOUNTER — Encounter: Payer: Self-pay | Admitting: Registered Nurse

## 2017-12-02 VITALS — BP 117/87 | HR 95 | Resp 14 | Ht 64.0 in | Wt 155.0 lb

## 2017-12-02 DIAGNOSIS — Z79891 Long term (current) use of opiate analgesic: Secondary | ICD-10-CM | POA: Diagnosis not present

## 2017-12-02 DIAGNOSIS — T451X5A Adverse effect of antineoplastic and immunosuppressive drugs, initial encounter: Secondary | ICD-10-CM | POA: Diagnosis not present

## 2017-12-02 DIAGNOSIS — Z9221 Personal history of antineoplastic chemotherapy: Secondary | ICD-10-CM | POA: Insufficient documentation

## 2017-12-02 DIAGNOSIS — M5416 Radiculopathy, lumbar region: Secondary | ICD-10-CM | POA: Diagnosis not present

## 2017-12-02 DIAGNOSIS — M79604 Pain in right leg: Secondary | ICD-10-CM | POA: Diagnosis not present

## 2017-12-02 DIAGNOSIS — G4700 Insomnia, unspecified: Secondary | ICD-10-CM

## 2017-12-02 DIAGNOSIS — M79605 Pain in left leg: Secondary | ICD-10-CM | POA: Diagnosis not present

## 2017-12-02 DIAGNOSIS — Z5181 Encounter for therapeutic drug level monitoring: Secondary | ICD-10-CM

## 2017-12-02 DIAGNOSIS — T451X5S Adverse effect of antineoplastic and immunosuppressive drugs, sequela: Secondary | ICD-10-CM | POA: Insufficient documentation

## 2017-12-02 DIAGNOSIS — G620 Drug-induced polyneuropathy: Secondary | ICD-10-CM | POA: Diagnosis not present

## 2017-12-02 DIAGNOSIS — M545 Low back pain: Secondary | ICD-10-CM | POA: Diagnosis not present

## 2017-12-02 DIAGNOSIS — M961 Postlaminectomy syndrome, not elsewhere classified: Secondary | ICD-10-CM | POA: Insufficient documentation

## 2017-12-02 DIAGNOSIS — G8929 Other chronic pain: Secondary | ICD-10-CM | POA: Diagnosis not present

## 2017-12-02 DIAGNOSIS — G894 Chronic pain syndrome: Secondary | ICD-10-CM

## 2017-12-02 DIAGNOSIS — F17210 Nicotine dependence, cigarettes, uncomplicated: Secondary | ICD-10-CM | POA: Diagnosis not present

## 2017-12-02 DIAGNOSIS — Z760 Encounter for issue of repeat prescription: Secondary | ICD-10-CM | POA: Insufficient documentation

## 2017-12-02 MED ORDER — HYDROCODONE-ACETAMINOPHEN 10-325 MG PO TABS: ORAL_TABLET | ORAL | 0 refills | Status: DC

## 2017-12-02 NOTE — Progress Notes (Signed)
Subjective:    Patient ID: Elizabeth Torres, female    DOB: 03/22/1976, 42 y.o.   MRN: [id number]  HPI: Elizabeth Torres is a 42 year old female female who returns for follow up appointment for chronic pain and medication refill. She states her pain is located in her lower back radiating into her bilateral lower extremities. Elizabeth Torres also reports her new job is very demanding on her physical body and has been experiencing increase intensity of lower back pain, her current dose of hydrocodone only lasting about 4-6 hours.  We will increase her tablets to allow her to take the hydrocodone 5 times a day on work days only, she verbalizes understanding.  She rates her pain 4. Her current exercise regime is walking and performing stretching exercises.  Elizabeth Torres Morphine Equivalent is 44.00 MME.  Last UDS was Performed on 04/02//2019, it was consistent.   She's working 40 hours a week.    Pain Inventory Average Pain 4 Pain Right Now 4 My pain is constant, dull and aching  In the last 24 hours, has pain interfered with the following? General activity 4 Relation with others 3 Enjoyment of life 3 What TIME of day is your pain at its worst? evening Sleep (in general) Fair  Pain is worse with: sitting and inactivity Pain improves with: rest and medication Relief from Meds: 6  Mobility Do you have any goals in this area?  no  Function Do you have any goals in this area?  no  Neuro/Psych No problems in this area  Prior Studies Any changes since last visit?  no  Physicians involved in your care Any changes since last visit?  no   Family History  Problem Relation Age of Onset  . Diabetes Mother   . Hypertension Mother   . Cancer Paternal Uncle   . Breast cancer Paternal Uncle        40'S  . Breast cancer Paternal Aunt        24'S   Social History   Socioeconomic History  . Marital status: Married    Spouse name: Not on file  . Number of children: Not on file  . Years of  education: Not on file  . Highest education level: Not on file  Occupational History  . Not on file  Social Needs  . Financial resource strain: Not on file  . Food insecurity:    Worry: Not on file    Inability: Not on file  . Transportation needs:    Medical: Not on file    Non-medical: Not on file  Tobacco Use  . Smoking status: Current Some Day Smoker    Packs/day: 1.00    Types: Cigarettes  . Smokeless tobacco: Never Used  Substance and Sexual Activity  . Alcohol use: Yes    Comment: socially  . Drug use: No  . Sexual activity: Not on file  Lifestyle  . Physical activity:    Days per week: Not on file    Minutes per session: Not on file  . Stress: Not on file  Relationships  . Social connections:    Talks on phone: Not on file    Gets together: Not on file    Attends religious service: Not on file    Active member of club or organization: Not on file    Attends meetings of clubs or organizations: Not on file    Relationship status: Not on file  Other Topics Concern  .  Not on file  Social History Narrative  . Not on file   Past Surgical History:  Procedure Laterality Date  . BREAST BIOPSY Right 07/2011   invasive ductal carcinoma  . 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 RAd and chemo tx  . BREAST SURGERY    . mastectomy Right    partial with lymph node dissection  . PORT A CATH REVISION    . PORT-A-CATH REMOVAL  11-07-15   Dr Bary Castilla  . SPINE SURGERY  2008   Dr Joya Salm   Past Medical History:  Diagnosis Date  . BRCA negative   . Breast cancer ([place]) 2013   RT LUMPECTOMY with chemo and rad tx  . Cancer ([place])   . Personal history of chemotherapy 2013   for breast ca  . Radiation 2013   BREAST CA  . Status post chemotherapy 2013   BREAST CA   BP 117/87 (BP Location: Left Arm, Patient Position: Sitting, Cuff Size: Normal)   Pulse 95   Resp 14   Ht _0  (1.626 m)   Wt 155 lb (70.3 kg)   SpO2 95%   BMI  26.61 kg/m   Opioid Risk Score:   Fall Risk Score:  `1  Depression screen PHQ 2/9  Depression screen [facility] 2/9 12/02/2017 09/08/2017 04/06/2017 03/03/2017 03/13/2016 10/10/2015 08/15/2015  Decreased Interest 0 0 0 0 0 0 0  Down, Depressed, Hopeless 0 0 0 0 0 0 0  PHQ - 2 Score 0 0 0 0 0 0 0  Altered sleeping - - - - - 2 -  Tired, decreased energy - - - - - 1 -  Change in appetite - - - - - 0 -  Feeling bad or failure about yourself  - - - - - 0 -  Trouble concentrating - - - - - 0 -  Moving slowly or fidgety/restless - - - - - 0 -  Suicidal thoughts - - - - - - -  PHQ-9 Score - - - - - 3 -  Difficult doing work/chores - - - - - Not difficult at all -    Review of Systems  Constitutional: Negative.   HENT: Negative.   Eyes: Negative.   Respiratory: Negative.   Cardiovascular: Negative.   Gastrointestinal: Negative.   Endocrine: Negative.   Genitourinary: Negative.   Musculoskeletal: Positive for back pain.  Skin: Negative.   Allergic/Immunologic: Negative.   Neurological: Negative.   Hematological: Negative.   Psychiatric/Behavioral: Negative.        Objective:   Physical Exam  Constitutional: She is oriented to person, place, and time. She appears well-developed and well-nourished.  HENT:  Head: Normocephalic and atraumatic.  Neck: Normal range of motion. Neck supple.  Cardiovascular: Normal rate and regular rhythm.  Pulmonary/Chest: Effort normal and breath sounds normal.  Musculoskeletal:  Normal Muscle Bulk and Muscle Testing Reveals: Upper Extremities: Full ROM and Muscle Strength 5/5 Bilateral AC Joint Tenderness Thoracic Paraspinal Tenderness: T-1-T-3 Lower Extremities: Full ROM and Muscle Strength 5/5 Arises from chair with ease Narrow Based gait   Neurological: She is alert and oriented to person, place, and time.  Skin: Skin is warm and dry.  Nursing note and vitals reviewed.         Assessment & Plan:  1. Lumbar postlaminectomy syndrome status post  L5-S1 fusion with chronic S1 radiculopathy. Continue current medication regimen with Amitriptylline 12/02/2017 Refilled: Increased Hydrocodone 10/325 mg #145-one every 6 hours  as needed for pain, may take an extra tablet on work days only. 12/02/2017. We will continue the opioid monitoring program, this consists of regular clinic visits, examinations, urine drug screen, pill counts as well as use of [place] Controlled Substance reporting System. 2. 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 12/02/2017 3. Insomnia: Continue current medication regimen Ambien. 12/02/2017  20 minutes of face to face patient care time was spent during this visit. All questions were encouraged and answered.  F/U in 1 month.

## 2017-12-10 ENCOUNTER — Ambulatory Visit
Admission: RE | Admit: 2017-12-10 | Discharge: 2017-12-10 | Disposition: A | Discharge status: Home / Self Care | Payer: BLUE CROSS/BLUE SHIELD | Source: Ambulatory Visit | Attending: Oncology | Admitting: Oncology

## 2017-12-10 DIAGNOSIS — N6489 Other specified disorders of breast: Secondary | ICD-10-CM | POA: Diagnosis not present

## 2017-12-10 DIAGNOSIS — C50511 Malignant neoplasm of lower-outer quadrant of right female breast: Secondary | ICD-10-CM

## 2017-12-10 DIAGNOSIS — R928 Other abnormal and inconclusive findings on diagnostic imaging of breast: Secondary | ICD-10-CM | POA: Diagnosis not present

## 2017-12-10 HISTORY — DX: Personal history of irradiation: Z92.3

## 2017-12-20 ENCOUNTER — Telehealth: Payer: Self-pay

## 2017-12-20 NOTE — Telephone Encounter (Signed)
Patient called back for [name], wanted to tell her that the new medication scheduling that [facility] placed her on is working well.

## 2018-01-05 ENCOUNTER — Telehealth: Payer: Self-pay | Admitting: Registered Nurse

## 2018-01-05 MED ORDER — HYDROCODONE-ACETAMINOPHEN 10-325 MG PO TABS: ORAL_TABLET | ORAL | 0 refills | Status: DC

## 2018-01-05 NOTE — Telephone Encounter (Signed)
Hydrocodone prescription sent, According to the PMP Aware Web-site last prescription picked up on 12/02/2017. Scheduled appointment on 01/11/2018. Left message for Ms. Giuffre regarding the above, awaiting response.

## 2018-01-11 ENCOUNTER — Encounter: Payer: Self-pay | Admitting: Registered Nurse

## 2018-01-11 ENCOUNTER — Encounter: Payer: BLUE CROSS/BLUE SHIELD | Attending: Physical Medicine and Rehabilitation | Admitting: Registered Nurse

## 2018-01-11 VITALS — BP 125/86 | HR 91 | Ht 64.0 in | Wt 156.0 lb

## 2018-01-11 DIAGNOSIS — G620 Drug-induced polyneuropathy: Secondary | ICD-10-CM | POA: Insufficient documentation

## 2018-01-11 DIAGNOSIS — M79604 Pain in right leg: Secondary | ICD-10-CM | POA: Insufficient documentation

## 2018-01-11 DIAGNOSIS — T451X5A Adverse effect of antineoplastic and immunosuppressive drugs, initial encounter: Secondary | ICD-10-CM | POA: Diagnosis not present

## 2018-01-11 DIAGNOSIS — M5416 Radiculopathy, lumbar region: Secondary | ICD-10-CM | POA: Diagnosis not present

## 2018-01-11 DIAGNOSIS — G8929 Other chronic pain: Secondary | ICD-10-CM | POA: Diagnosis not present

## 2018-01-11 DIAGNOSIS — F17210 Nicotine dependence, cigarettes, uncomplicated: Secondary | ICD-10-CM | POA: Diagnosis not present

## 2018-01-11 DIAGNOSIS — G4700 Insomnia, unspecified: Secondary | ICD-10-CM | POA: Diagnosis not present

## 2018-01-11 DIAGNOSIS — G894 Chronic pain syndrome: Secondary | ICD-10-CM

## 2018-01-11 DIAGNOSIS — Z79891 Long term (current) use of opiate analgesic: Secondary | ICD-10-CM | POA: Diagnosis not present

## 2018-01-11 DIAGNOSIS — M545 Low back pain: Secondary | ICD-10-CM | POA: Diagnosis not present

## 2018-01-11 DIAGNOSIS — Z760 Encounter for issue of repeat prescription: Secondary | ICD-10-CM | POA: Insufficient documentation

## 2018-01-11 DIAGNOSIS — M79605 Pain in left leg: Secondary | ICD-10-CM | POA: Insufficient documentation

## 2018-01-11 DIAGNOSIS — T451X5S Adverse effect of antineoplastic and immunosuppressive drugs, sequela: Secondary | ICD-10-CM | POA: Insufficient documentation

## 2018-01-11 DIAGNOSIS — Z9221 Personal history of antineoplastic chemotherapy: Secondary | ICD-10-CM | POA: Insufficient documentation

## 2018-01-11 DIAGNOSIS — M961 Postlaminectomy syndrome, not elsewhere classified: Secondary | ICD-10-CM | POA: Insufficient documentation

## 2018-01-11 DIAGNOSIS — Z5181 Encounter for therapeutic drug level monitoring: Secondary | ICD-10-CM | POA: Diagnosis not present

## 2018-01-11 MED ORDER — HYDROCODONE-ACETAMINOPHEN 10-325 MG PO TABS: ORAL_TABLET | ORAL | 0 refills | Status: DC

## 2018-01-11 NOTE — Progress Notes (Signed)
Subjective:    Patient ID: Elizabeth Torres, female    DOB: 09/20/1975, 42 y.o.   MRN: [id number]  HPI: [name] is a 42 year old female who returns for follow up appointment for chronic pain and medication refill. She states her pain is located in her lower back radiating into her bilateral lower extremities. She rates her pain 4. Her current exercise regime is walking.   Elizabeth Torres Morphine Equivalent is 50.00MME. Last UDS was Performed on 11/02/2017, it was consistent.   Pain Inventory Average Pain 4 Pain Right Now 4 My pain is constant, dull and aching  In the last 24 hours, has pain interfered with the following? General activity 3 Relation with others 3 Enjoyment of life 2 What TIME of day is your pain at its worst? evening Sleep (in general) Fair  Pain is worse with: sitting, inactivity and standing Pain improves with: rest and medication Relief from Meds: 5  Mobility walk without assistance Do you have any goals in this area?  no  Function Do you have any goals in this area?  no  Neuro/Psych No problems in this area  Prior Studies Any changes since last visit?  no  Physicians involved in your care Any changes since last visit?  no   Family History  Problem Relation Age of Onset  . Diabetes Mother   . Hypertension Mother   . Cancer Paternal Uncle   . Breast cancer Paternal Uncle        40'S  . Breast cancer Paternal Aunt        63'S   Social History   Socioeconomic History  . Marital status: Married    Spouse name: Not on file  . Number of children: Not on file  . Years of education: Not on file  . Highest education level: Not on file  Occupational History  . Not on file  Social Needs  . Financial resource strain: Not on file  . Food insecurity:    Worry: Not on file    Inability: Not on file  . Transportation needs:    Medical: Not on file    Non-medical: Not on file  Tobacco Use  . Smoking status: Current Some Day Smoker   Packs/day: 1.00    Types: Cigarettes  . Smokeless tobacco: Never Used  Substance and Sexual Activity  . Alcohol use: Yes    Comment: socially  . Drug use: No  . Sexual activity: Not on file  Lifestyle  . Physical activity:    Days per week: Not on file    Minutes per session: Not on file  . Stress: Not on file  Relationships  . Social connections:    Talks on phone: Not on file    Gets together: Not on file    Attends religious service: Not on file    Active member of club or organization: Not on file    Attends meetings of clubs or organizations: Not on file    Relationship status: Not on file  Other Topics Concern  . Not on file  Social History Narrative  . Not on file   Past Surgical History:  Procedure Laterality Date  . BREAST BIOPSY Right 07/2011   invasive ductal carcinoma  . 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 RAd and chemo tx  . BREAST SURGERY    . mastectomy Right    partial with lymph node dissection  .  PORT A CATH REVISION    . PORT-A-CATH REMOVAL  11-07-15   Dr Bary Castilla  . SPINE SURGERY  2008   Dr Joya Salm   Past Medical History:  Diagnosis Date  . BRCA negative   . Breast cancer ([place]) 2013   RT LUMPECTOMY with chemo and rad tx  . Personal history of chemotherapy 2013   for breast ca  . Personal history of radiation therapy 2013   F/U right breast cancer   . Radiation 2013   BREAST CA  . Status post chemotherapy 2013   BREAST CA   There were no vitals taken for this visit.  Opioid Risk Score:   Fall Risk Score:  `1  Depression screen PHQ 2/9  Depression screen [facility] 2/9 12/02/2017 09/08/2017 04/06/2017 03/03/2017 03/13/2016 10/10/2015 08/15/2015  Decreased Interest 0 0 0 0 0 0 0  Down, Depressed, Hopeless 0 0 0 0 0 0 0  PHQ - 2 Score 0 0 0 0 0 0 0  Altered sleeping - - - - - 2 -  Tired, decreased energy - - - - - 1 -  Change in appetite - - - - - 0 -  Feeling bad or failure about yourself  - - - - - 0 -    Trouble concentrating - - - - - 0 -  Moving slowly or fidgety/restless - - - - - 0 -  Suicidal thoughts - - - - - - -  PHQ-9 Score - - - - - 3 -  Difficult doing work/chores - - - - - Not difficult at all -    Review of Systems  Constitutional: Negative.   HENT: Negative.   Eyes: Negative.   Respiratory: Negative.   Cardiovascular: Negative.   Gastrointestinal: Negative.   Endocrine: Negative.   Genitourinary: Negative.   Musculoskeletal: Positive for arthralgias, back pain and myalgias.  Skin: Negative.   Allergic/Immunologic: Negative.   Neurological: Negative.   Hematological: Negative.   Psychiatric/Behavioral: Negative.   All other systems reviewed and are negative.      Objective:   Physical Exam  Constitutional: She is oriented to person, place, and time. She appears well-developed and well-nourished.  HENT:  Head: Normocephalic and atraumatic.  Neck: Normal range of motion. Neck supple.  Cardiovascular: Normal rate and regular rhythm.  Pulmonary/Chest: Effort normal and breath sounds normal.  Musculoskeletal:  Normal Muscle Bulk and Muscle Testing Reveals: Upper Extremities: Full ROM and Muscle Strength 5/5 Back without spinal tenderness noted Lower Extremities: Full ROM and Muscle Strength 5/5 Arises from chair with ease Narrow Based gait  Neurological: She is alert and oriented to person, place, and time.  Skin: Skin is warm and dry.  Psychiatric: She has a normal mood and affect.  Nursing note and vitals reviewed.         Assessment & Plan:  1. Lumbar postlaminectomy syndrome status post L5-S1 fusion with chronic S1 radiculopathy.Continue current medication regimen with Amitriptylline05/09/2017 Refilled:  Hydrocodone 10/325 mg #145-one every 6 hours as needed for pain, may take an extra tablet on work days only. 01/10/2018. We will continue the opioid monitoring program, this consists of regular clinic visits, examinations, urine drug screen, pill  counts as well as use of [place] Controlled Substance reporting System. 2. 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 01/10/2018 3. Insomnia: Continue current medication regimen Ambien. 01/10/2018  20 minutes of face to face patient care time was spent  during this visit. All questions were encouraged and answered.  F/U in 1 month.

## 2018-01-26 ENCOUNTER — Other Ambulatory Visit: Payer: Self-pay | Admitting: Physical Medicine & Rehabilitation

## 2018-02-15 ENCOUNTER — Encounter: Payer: BLUE CROSS/BLUE SHIELD | Admitting: Registered Nurse

## 2018-02-15 ENCOUNTER — Encounter: Payer: BLUE CROSS/BLUE SHIELD | Attending: Physical Medicine and Rehabilitation | Admitting: Registered Nurse

## 2018-02-15 ENCOUNTER — Encounter: Payer: Self-pay | Admitting: Registered Nurse

## 2018-02-15 VITALS — BP 127/93 | HR 91 | Resp 14 | Ht 63.0 in | Wt 155.0 lb

## 2018-02-15 DIAGNOSIS — T451X5S Adverse effect of antineoplastic and immunosuppressive drugs, sequela: Secondary | ICD-10-CM | POA: Diagnosis not present

## 2018-02-15 DIAGNOSIS — G8929 Other chronic pain: Secondary | ICD-10-CM | POA: Insufficient documentation

## 2018-02-15 DIAGNOSIS — M5416 Radiculopathy, lumbar region: Secondary | ICD-10-CM | POA: Diagnosis not present

## 2018-02-15 DIAGNOSIS — Z9221 Personal history of antineoplastic chemotherapy: Secondary | ICD-10-CM | POA: Diagnosis not present

## 2018-02-15 DIAGNOSIS — M79604 Pain in right leg: Secondary | ICD-10-CM | POA: Diagnosis not present

## 2018-02-15 DIAGNOSIS — G620 Drug-induced polyneuropathy: Secondary | ICD-10-CM | POA: Diagnosis not present

## 2018-02-15 DIAGNOSIS — M79605 Pain in left leg: Secondary | ICD-10-CM | POA: Insufficient documentation

## 2018-02-15 DIAGNOSIS — T451X5A Adverse effect of antineoplastic and immunosuppressive drugs, initial encounter: Secondary | ICD-10-CM | POA: Diagnosis not present

## 2018-02-15 DIAGNOSIS — Z760 Encounter for issue of repeat prescription: Secondary | ICD-10-CM | POA: Diagnosis not present

## 2018-02-15 DIAGNOSIS — F17210 Nicotine dependence, cigarettes, uncomplicated: Secondary | ICD-10-CM | POA: Diagnosis not present

## 2018-02-15 DIAGNOSIS — M545 Low back pain: Secondary | ICD-10-CM | POA: Insufficient documentation

## 2018-02-15 DIAGNOSIS — M961 Postlaminectomy syndrome, not elsewhere classified: Secondary | ICD-10-CM | POA: Diagnosis not present

## 2018-02-15 DIAGNOSIS — G894 Chronic pain syndrome: Secondary | ICD-10-CM

## 2018-02-15 DIAGNOSIS — Z5181 Encounter for therapeutic drug level monitoring: Secondary | ICD-10-CM | POA: Diagnosis not present

## 2018-02-15 DIAGNOSIS — Z79891 Long term (current) use of opiate analgesic: Secondary | ICD-10-CM

## 2018-02-15 MED ORDER — HYDROCODONE-ACETAMINOPHEN 10-325 MG PO TABS: ORAL_TABLET | ORAL | 0 refills | Status: DC

## 2018-02-15 NOTE — Progress Notes (Signed)
Subjective:    Patient ID: Elizabeth Torres, female    DOB: 09-14-1975, 42 y.o.   MRN: [id number]  HPI: Elizabeth Torres is a 42 year old female who returns for follow up appointment for chronic pain and medication refill. She states her pain is located in her lower back radiating into her bilateral lower extremities. She rates her pain 4. Her current exercise regime is walking.   Elizabeth Torres states her son is in [facility] on [facility], waiting on heart transplant, emotional support given.   [name] Morphine Equivalent is 48.33 MME. Last UDS was Performed on 11/02/2017, it was consistent.    Pain Inventory Average Pain 4 Pain Right Now 4 My pain is constant and dull  In the last 24 hours, has pain interfered with the following? General activity 4 Relation with others 3 Enjoyment of life 3 What TIME of day is your pain at its worst? evening Sleep (in general) Fair  Pain is worse with: inactivity and standing Pain improves with: rest and medication Relief from Meds: 4  Mobility walk without assistance Do you have any goals in this area?  no  Function Do you have any goals in this area?  no  Neuro/Psych No problems in this area  Prior Studies Any changes since last visit?  no  Physicians involved in your care Any changes since last visit?  no   Family History  Problem Relation Age of Onset  . Diabetes Mother   . Hypertension Mother   . Cancer Paternal Uncle   . Breast cancer Paternal Uncle        40'S  . Breast cancer Paternal Aunt        92'S   Social History   Socioeconomic History  . Marital status: Married    Spouse name: Not on file  . Number of children: Not on file  . Years of education: Not on file  . Highest education level: Not on file  Occupational History  . Not on file  Social Needs  . Financial resource strain: Not on file  . Food insecurity:    Worry: Not on file    Inability: Not on file  . Transportation needs:    Medical: Not on  file    Non-medical: Not on file  Tobacco Use  . Smoking status: Current Some Day Smoker    Packs/day: 1.00    Types: Cigarettes  . Smokeless tobacco: Never Used  Substance and Sexual Activity  . Alcohol use: Yes    Comment: socially  . Drug use: No  . Sexual activity: Not on file  Lifestyle  . Physical activity:    Days per week: Not on file    Minutes per session: Not on file  . Stress: Not on file  Relationships  . Social connections:    Talks on phone: Not on file    Gets together: Not on file    Attends religious service: Not on file    Active member of club or organization: Not on file    Attends meetings of clubs or organizations: Not on file    Relationship status: Not on file  Other Topics Concern  . Not on file  Social History Narrative  . Not on file   Past Surgical History:  Procedure Laterality Date  . BREAST BIOPSY Right 07/2011   invasive ductal carcinoma  . 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 RAd  and chemo tx  . BREAST SURGERY    . mastectomy Right    partial with lymph node dissection  . PORT A CATH REVISION    . PORT-A-CATH REMOVAL  11-07-15   Dr Bary Castilla  . SPINE SURGERY  2008   Dr Joya Salm   Past Medical History:  Diagnosis Date  . BRCA negative   . Breast cancer ([place]) 2013   RT LUMPECTOMY with chemo and rad tx  . Personal history of chemotherapy 2013   for breast ca  . Personal history of radiation therapy 2013   F/U right breast cancer   . Radiation 2013   BREAST CA  . Status post chemotherapy 2013   BREAST CA   BP (!) 127/93 (BP Location: Left Arm, Patient Position: Sitting, Cuff Size: Normal)   Pulse 91   Resp 14   Ht '5\' 3"'  (1.6 m)   Wt 155 lb (70.3 kg)   SpO2 96%   BMI 27.46 kg/m   Opioid Risk Score:   Fall Risk Score:  `1  Depression screen PHQ 2/9  Depression screen [facility] 2/9 12/02/2017 09/08/2017 04/06/2017 03/03/2017 03/13/2016 10/10/2015 08/15/2015  Decreased Interest 0 0 0 0 0 0 0    Down, Depressed, Hopeless 0 0 0 0 0 0 0  PHQ - 2 Score 0 0 0 0 0 0 0  Altered sleeping - - - - - 2 -  Tired, decreased energy - - - - - 1 -  Change in appetite - - - - - 0 -  Feeling bad or failure about yourself  - - - - - 0 -  Trouble concentrating - - - - - 0 -  Moving slowly or fidgety/restless - - - - - 0 -  Suicidal thoughts - - - - - - -  PHQ-9 Score - - - - - 3 -  Difficult doing work/chores - - - - - Not difficult at all -    Review of Systems  Constitutional: Negative.   HENT: Negative.   Eyes: Negative.   Respiratory: Negative.   Cardiovascular: Negative.   Gastrointestinal: Negative.   Endocrine: Negative.   Genitourinary: Negative.   Musculoskeletal: Positive for back pain.  Skin: Negative.   Allergic/Immunologic: Negative.   Neurological: Negative.   Hematological: Negative.   Psychiatric/Behavioral: Negative.   All other systems reviewed and are negative.      Objective:   Physical Exam  Constitutional: She is oriented to person, place, and time. She appears well-developed and well-nourished.  HENT:  Head: Normocephalic and atraumatic.  Neck: Normal range of motion. Neck supple.  Cardiovascular: Normal rate and regular rhythm.  Pulmonary/Chest: Effort normal and breath sounds normal.  Musculoskeletal:  Normal Muscle Bulk and Muscle Testing Reveals: Upper Extremities: Full ROM and Muscle Strength 5/5 Back without spinal tenderness noted Lower Extremities: Full ROM and Muscle Strength 5/5 Arises from Chair with Ease  Narrow Based Gait  Neurological: She is alert and oriented to person, place, and time.  Skin: Skin is warm and dry.  Psychiatric: She has a normal mood and affect. Her behavior is normal.  Nursing note and vitals reviewed.         Assessment & Plan:  1. Lumbar postlaminectomy syndrome status post L5-S1 fusion with chronic S1 radiculopathy.Continuecurrent medication regimen withNortriptylline07/16/2019 Refilled:Hydrocodone  10/325 mg #145-one every 6 hours as needed for pain, may take an extra tablet on work days only. 02/15/2018. We will continue the opioid monitoring program, this consists of regular  clinic visits, examinations, urine drug screen, pill counts as well as use of [place] Controlled Substance reporting System. 2. 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 02/15/2018 3. Insomnia: Continuecurrent medication regimenAmbien. 02/15/2018  20 minutes of face to face patient care time was spent during this visit. All questions were encouraged and answered.  F/U in 1 month.

## 2018-03-23 ENCOUNTER — Encounter: Payer: Self-pay | Admitting: Registered Nurse

## 2018-03-23 ENCOUNTER — Encounter: Payer: BLUE CROSS/BLUE SHIELD | Attending: Physical Medicine and Rehabilitation | Admitting: Registered Nurse

## 2018-03-23 VITALS — BP 115/83 | HR 89 | Ht 63.0 in | Wt 154.0 lb

## 2018-03-23 DIAGNOSIS — F17210 Nicotine dependence, cigarettes, uncomplicated: Secondary | ICD-10-CM | POA: Insufficient documentation

## 2018-03-23 DIAGNOSIS — G620 Drug-induced polyneuropathy: Secondary | ICD-10-CM | POA: Insufficient documentation

## 2018-03-23 DIAGNOSIS — M5416 Radiculopathy, lumbar region: Secondary | ICD-10-CM

## 2018-03-23 DIAGNOSIS — T451X5S Adverse effect of antineoplastic and immunosuppressive drugs, sequela: Secondary | ICD-10-CM | POA: Insufficient documentation

## 2018-03-23 DIAGNOSIS — Z79891 Long term (current) use of opiate analgesic: Secondary | ICD-10-CM

## 2018-03-23 DIAGNOSIS — Z760 Encounter for issue of repeat prescription: Secondary | ICD-10-CM | POA: Insufficient documentation

## 2018-03-23 DIAGNOSIS — Z9221 Personal history of antineoplastic chemotherapy: Secondary | ICD-10-CM | POA: Diagnosis not present

## 2018-03-23 DIAGNOSIS — Z5181 Encounter for therapeutic drug level monitoring: Secondary | ICD-10-CM

## 2018-03-23 DIAGNOSIS — G8929 Other chronic pain: Secondary | ICD-10-CM | POA: Diagnosis not present

## 2018-03-23 DIAGNOSIS — M79604 Pain in right leg: Secondary | ICD-10-CM | POA: Insufficient documentation

## 2018-03-23 DIAGNOSIS — G894 Chronic pain syndrome: Secondary | ICD-10-CM

## 2018-03-23 DIAGNOSIS — M545 Low back pain: Secondary | ICD-10-CM | POA: Diagnosis not present

## 2018-03-23 DIAGNOSIS — M961 Postlaminectomy syndrome, not elsewhere classified: Secondary | ICD-10-CM | POA: Diagnosis not present

## 2018-03-23 DIAGNOSIS — M79605 Pain in left leg: Secondary | ICD-10-CM | POA: Insufficient documentation

## 2018-03-23 DIAGNOSIS — G4700 Insomnia, unspecified: Secondary | ICD-10-CM

## 2018-03-23 DIAGNOSIS — T451X5A Adverse effect of antineoplastic and immunosuppressive drugs, initial encounter: Secondary | ICD-10-CM

## 2018-03-23 MED ORDER — ZOLPIDEM TARTRATE 5 MG PO TABS: ORAL_TABLET | ORAL | 2 refills | Status: DC

## 2018-03-23 MED ORDER — HYDROCODONE-ACETAMINOPHEN 10-325 MG PO TABS: ORAL_TABLET | ORAL | 0 refills | Status: DC

## 2018-03-23 NOTE — Progress Notes (Signed)
Subjective:    Patient ID: Elizabeth Torres, female    DOB: 05-02-1976, 42 y.o.   MRN: [id number]  HPI: Mr. Elizabeth Torres is a 42 year old female who returns for follow up appointment for chronic pain and medication refill. She states her pain is located in her lower back radiating into her bilateral lower extremities. She rates her pain 3. Her current exercise regime is walking.  Elizabeth Torres reports her son passed away on 03-15-2018, emotional support given.   Elizabeth Torres Morphine Equivalent is 50.00 MME. Last UDS was Performed on 11/02/2017, it was consistent.   Pain Inventory Average Pain 4 Pain Right Now 3 My pain is constant, dull and aching  In the last 24 hours, has pain interfered with the following? General activity 3 Relation with others 3 Enjoyment of life 3 What TIME of day is your pain at its worst? evening Sleep (in general) Poor  Pain is worse with: walking, sitting, inactivity and standing Pain improves with: rest and medication Relief from Meds: 5  Mobility Do you have any goals in this area?  no  Function Do you have any goals in this area?  no  Neuro/Psych No problems in this area  Prior Studies Any changes since last visit?  no  Physicians involved in your care Any changes since last visit?  no   Family History  Problem Relation Age of Onset  . Diabetes Mother   . Hypertension Mother   . Cancer Paternal Uncle   . Breast cancer Paternal Uncle        40'S  . Breast cancer Paternal Aunt        72'S   Social History   Socioeconomic History  . Marital status: Married    Spouse name: Not on file  . Number of children: Not on file  . Years of education: Not on file  . Highest education level: Not on file  Occupational History  . Not on file  Social Needs  . Financial resource strain: Not on file  . Food insecurity:    Worry: Not on file    Inability: Not on file  . Transportation needs:    Medical: Not on file    Non-medical: Not on file    Tobacco Use  . Smoking status: Current Some Day Smoker    Packs/day: 1.00    Types: Cigarettes  . Smokeless tobacco: Never Used  Substance and Sexual Activity  . Alcohol use: Yes    Comment: socially  . Drug use: No  . Sexual activity: Not on file  Lifestyle  . Physical activity:    Days per week: Not on file    Minutes per session: Not on file  . Stress: Not on file  Relationships  . Social connections:    Talks on phone: Not on file    Gets together: Not on file    Attends religious service: Not on file    Active member of club or organization: Not on file    Attends meetings of clubs or organizations: Not on file    Relationship status: Not on file  Other Topics Concern  . Not on file  Social History Narrative  . Not on file   Past Surgical History:  Procedure Laterality Date  . BREAST BIOPSY Right 07/2011   invasive ductal carcinoma  . 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 RAd and chemo tx  . BREAST SURGERY    .  mastectomy Right    partial with lymph node dissection  . PORT A CATH REVISION    . PORT-A-CATH REMOVAL  11-07-15   Dr Bary Castilla  . SPINE SURGERY  2008   Dr Joya Salm   Past Medical History:  Diagnosis Date  . BRCA negative   . Breast cancer ([place]) 2013   RT LUMPECTOMY with chemo and rad tx  . Personal history of chemotherapy 2013   for breast ca  . Personal history of radiation therapy 2013   F/U right breast cancer   . Radiation 2013   BREAST CA  . Status post chemotherapy 2013   BREAST CA   There were no vitals taken for this visit.  Opioid Risk Score:   Fall Risk Score:  `1  Depression screen PHQ 2/9  Depression screen [facility] 2/9 12/02/2017 09/08/2017 04/06/2017 03/03/2017 03/13/2016 10/10/2015 08/15/2015  Decreased Interest 0 0 0 0 0 0 0  Down, Depressed, Hopeless 0 0 0 0 0 0 0  PHQ - 2 Score 0 0 0 0 0 0 0  Altered sleeping - - - - - 2 -  Tired, decreased energy - - - - - 1 -  Change in appetite - - - - -  0 -  Feeling bad or failure about yourself  - - - - - 0 -  Trouble concentrating - - - - - 0 -  Moving slowly or fidgety/restless - - - - - 0 -  Suicidal thoughts - - - - - - -  PHQ-9 Score - - - - - 3 -  Difficult doing work/chores - - - - - Not difficult at all -     Review of Systems  Constitutional: Negative.   HENT: Negative.   Eyes: Negative.   Respiratory: Negative.   Cardiovascular: Negative.   Gastrointestinal: Negative.   Endocrine: Negative.   Genitourinary: Negative.   Musculoskeletal: Positive for arthralgias, back pain and myalgias.  Skin: Negative.   Allergic/Immunologic: Negative.   Neurological: Negative.   Hematological: Negative.   Psychiatric/Behavioral: Negative.   All other systems reviewed and are negative.      Objective:   Physical Exam  Constitutional: She is oriented to person, place, and time. She appears well-developed and well-nourished.  HENT:  Head: Normocephalic and atraumatic.  Neck: Normal range of motion. Neck supple.  Cardiovascular: Normal rate and regular rhythm.  Pulmonary/Chest: Effort normal and breath sounds normal.  Musculoskeletal:  Normal Muscle Bulk and Muscle Testing Reveals: Upper Extremities: Full ROM and Muscle Strength 5/5 Lumbar Paraspinal Tenderness: L-3-L-5 Lower Extremities: Full ROM and Muscle Strength 5/5 Arises From chair with ease Narrow Based gait  Neurological: She is alert and oriented to person, place, and time.  Skin: Skin is warm and dry.  Psychiatric: She has a normal mood and affect. Her behavior is normal.  Nursing note and vitals reviewed.         Assessment & Plan:  1. Lumbar postlaminectomy syndrome status post L5-S1 fusion with chronic S1 radiculopathy.Continuecurrent medication regimen withNortriptylline08/21/2019 Refilled:Hydrocodone 10/325 mg #145-one every 6 hours as needed for pain, may take an extra tablet on work days only. 03/23/2018. We will continue the opioid monitoring  program, this consists of regular clinic visits, examinations, urine drug screen, pill counts as well as use of [place] Controlled Substance reporting System. 2. 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 03/23/2018 3. Insomnia: Continuecurrent medication regimenAmbien. 03/23/2018  20 minutes of face  to face patient care time was spent during this visit. All questions were encouraged and answered.  F/U in 1 month.

## 2018-04-20 ENCOUNTER — Encounter: Payer: BLUE CROSS/BLUE SHIELD | Attending: Physical Medicine and Rehabilitation | Admitting: Registered Nurse

## 2018-04-20 ENCOUNTER — Encounter: Payer: Self-pay | Admitting: Registered Nurse

## 2018-04-20 ENCOUNTER — Other Ambulatory Visit: Payer: Self-pay

## 2018-04-20 VITALS — BP 135/89 | HR 89 | Ht 63.0 in | Wt 154.6 lb

## 2018-04-20 DIAGNOSIS — Z760 Encounter for issue of repeat prescription: Secondary | ICD-10-CM | POA: Insufficient documentation

## 2018-04-20 DIAGNOSIS — G8929 Other chronic pain: Secondary | ICD-10-CM | POA: Diagnosis not present

## 2018-04-20 DIAGNOSIS — G894 Chronic pain syndrome: Secondary | ICD-10-CM

## 2018-04-20 DIAGNOSIS — Z9221 Personal history of antineoplastic chemotherapy: Secondary | ICD-10-CM | POA: Insufficient documentation

## 2018-04-20 DIAGNOSIS — T451X5S Adverse effect of antineoplastic and immunosuppressive drugs, sequela: Secondary | ICD-10-CM | POA: Diagnosis not present

## 2018-04-20 DIAGNOSIS — M545 Low back pain: Secondary | ICD-10-CM | POA: Insufficient documentation

## 2018-04-20 DIAGNOSIS — M79605 Pain in left leg: Secondary | ICD-10-CM | POA: Insufficient documentation

## 2018-04-20 DIAGNOSIS — G620 Drug-induced polyneuropathy: Secondary | ICD-10-CM | POA: Diagnosis not present

## 2018-04-20 DIAGNOSIS — Z79891 Long term (current) use of opiate analgesic: Secondary | ICD-10-CM

## 2018-04-20 DIAGNOSIS — F17210 Nicotine dependence, cigarettes, uncomplicated: Secondary | ICD-10-CM | POA: Diagnosis not present

## 2018-04-20 DIAGNOSIS — M79604 Pain in right leg: Secondary | ICD-10-CM | POA: Diagnosis not present

## 2018-04-20 DIAGNOSIS — M961 Postlaminectomy syndrome, not elsewhere classified: Secondary | ICD-10-CM

## 2018-04-20 DIAGNOSIS — T451X5A Adverse effect of antineoplastic and immunosuppressive drugs, initial encounter: Secondary | ICD-10-CM

## 2018-04-20 DIAGNOSIS — Z5181 Encounter for therapeutic drug level monitoring: Secondary | ICD-10-CM

## 2018-04-20 DIAGNOSIS — G4700 Insomnia, unspecified: Secondary | ICD-10-CM

## 2018-04-20 DIAGNOSIS — M5416 Radiculopathy, lumbar region: Secondary | ICD-10-CM

## 2018-04-20 MED ORDER — HYDROCODONE-ACETAMINOPHEN 10-325 MG PO TABS: ORAL_TABLET | ORAL | 0 refills | Status: DC

## 2018-04-20 NOTE — Progress Notes (Signed)
Subjective:    Patient ID: Elizabeth Torres, female    DOB: [birth year], 42 y.o.   MRN: [id number]  HPI: Elizabeth Torres is a 42 year old female who returns for follow up appointment for chronic pain and medication refill. She states her pain is located in her lower back radiating into her bilateral lower extremities. She rates her pain 3. Her current exercise regime is walking.   Elizabeth Torres Morphine Equivalent is 50.00 MME. Last UDS was Performed on 11/02/2017, it was consistent. UDS ordered today.    Pain Inventory Average Pain 4 Pain Right Now 3 My pain is constant, dull and aching  In the last 24 hours, has pain interfered with the following? General activity 3 Relation with others 3 Enjoyment of life 3 What TIME of day is your pain at its worst? evening Sleep (in general) Fair  Pain is worse with: sitting, inactivity and standing Pain improves with: rest and medication Relief from Meds: na  Mobility Do you have any goals in this area?  no  Function Do you have any goals in this area?  no  Neuro/Psych No problems in this area  Prior Studies Any changes since last visit?  no  Physicians involved in your care Any changes since last visit?  no   Family History  Problem Relation Age of Onset  . Diabetes Mother   . Hypertension Mother   . Cancer Paternal Uncle   . Breast cancer Paternal Uncle        40'S  . Breast cancer Paternal Aunt        70'S   Social History   Socioeconomic History  . Marital status: Married    Spouse name: Not on file  . Number of children: Not on file  . Years of education: Not on file  . Highest education level: Not on file  Occupational History  . Not on file  Social Needs  . Financial resource strain: Not on file  . Food insecurity:    Worry: Not on file    Inability: Not on file  . Transportation needs:    Medical: Not on file    Non-medical: Not on file  Tobacco Use  . Smoking status: Current Some Day Smoker    Packs/day:  1.00    Types: Cigarettes  . Smokeless tobacco: Never Used  Substance and Sexual Activity  . Alcohol use: Yes    Comment: socially  . Drug use: No  . Sexual activity: Not on file  Lifestyle  . Physical activity:    Days per week: Not on file    Minutes per session: Not on file  . Stress: Not on file  Relationships  . Social connections:    Talks on phone: Not on file    Gets together: Not on file    Attends religious service: Not on file    Active member of club or organization: Not on file    Attends meetings of clubs or organizations: Not on file    Relationship status: Not on file  Other Topics Concern  . Not on file  Social History Narrative  . Not on file   Past Surgical History:  Procedure Laterality Date  . BREAST BIOPSY Right 07/2011   invasive ductal carcinoma  . 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 RAd and chemo tx  . BREAST SURGERY    . mastectomy Right    partial with lymph  node dissection  . PORT A CATH REVISION    . PORT-A-CATH REMOVAL  11-07-15   Dr Bary Castilla  . SPINE SURGERY  2008   Dr Joya Salm   Past Medical History:  Diagnosis Date  . BRCA negative   . Breast cancer ([place]) 2013   RT LUMPECTOMY with chemo and rad tx  . Personal history of chemotherapy 2013   for breast ca  . Personal history of radiation therapy 2013   F/U right breast cancer   . Radiation 2013   BREAST CA  . Status post chemotherapy 2013   BREAST CA   BP 135/89   Pulse 89   Ht '5\' 3"'  (1.6 m)   Wt 154 lb 9.6 oz (70.1 kg)   SpO2 96%   BMI 27.39 kg/m   Opioid Risk Score:   Fall Risk Score:  `1  Depression screen PHQ 2/9  Depression screen [facility] 2/9 04/20/2018 12/02/2017 09/08/2017 04/06/2017 03/03/2017 03/13/2016 10/10/2015  Decreased Interest 0 0 0 0 0 0 0  Down, Depressed, Hopeless 0 0 0 0 0 0 0  PHQ - 2 Score 0 0 0 0 0 0 0  Altered sleeping - - - - - - 2  Tired, decreased energy - - - - - - 1  Change in appetite - - - - - - 0    Feeling bad or failure about yourself  - - - - - - 0  Trouble concentrating - - - - - - 0  Moving slowly or fidgety/restless - - - - - - 0  Suicidal thoughts - - - - - - -  PHQ-9 Score - - - - - - 3  Difficult doing work/chores - - - - - - Not difficult at all   Review of Systems  Constitutional: Negative.   HENT: Negative.   Eyes: Negative.   Respiratory: Negative.   Cardiovascular: Negative.   Gastrointestinal: Negative.   Endocrine: Negative.   Genitourinary: Negative.   Musculoskeletal: Negative.   Skin: Negative.   Allergic/Immunologic: Negative.   Neurological: Negative.   Hematological: Negative.   Psychiatric/Behavioral: Negative.   All other systems reviewed and are negative.      Objective:   Physical Exam  Constitutional: She is oriented to person, place, and time. She appears well-developed and well-nourished.  HENT:  Head: Normocephalic and atraumatic.  Neck: Normal range of motion. Neck supple.  Cardiovascular: Normal rate and regular rhythm.  Pulmonary/Chest: Effort normal and breath sounds normal.  Musculoskeletal:  Normal Muscle Bulk and Muscle Testing Reveals: Upper Extremities: Full ROM and Muscle Strength 5/5 Lower Extremities: Full ROM and Muscle Strength 5/5 Arises from chair with ease Narrow Based Gait  Neurological: She is alert and oriented to person, place, and time.  Skin: Skin is warm and dry.  Psychiatric: She has a normal mood and affect. Her behavior is normal.  Nursing note and vitals reviewed.         Assessment & Plan:  1. Lumbar postlaminectomy syndrome status post L5-S1 fusion with chronic S1 radiculopathy.Continuecurrent medication regimen withNortriptylline09/18/2019 Refilled:Hydrocodone 10/325 mg #145-one every 6 hours as needed for pain, may take an extra tablet on work days only. 03/23/2018. We will continue the opioid monitoring program, this consists of regular clinic visits, examinations, urine drug screen, pill  counts as well as use of [place] Controlled Substance reporting System. 2. 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 04/20/2018 3. Insomnia: Continuecurrent medication regimenAmbien.  04/20/2018  20 minutes of face to face patient care time was spent during this visit. All questions were encouraged and answered.  F/U in 1 month.

## 2018-04-26 LAB — TOXASSURE SELECT,+ANTIDEPR,UR

## 2018-05-03 ENCOUNTER — Telehealth: Payer: Self-pay | Admitting: *Deleted

## 2018-05-03 NOTE — Telephone Encounter (Signed)
Urine drug screen for this encounter is consistent for prescribed medication 

## 2018-05-18 ENCOUNTER — Encounter: Payer: Self-pay | Admitting: Registered Nurse

## 2018-05-18 ENCOUNTER — Encounter: Payer: BLUE CROSS/BLUE SHIELD | Attending: Physical Medicine and Rehabilitation | Admitting: Registered Nurse

## 2018-05-18 VITALS — BP 128/86 | HR 86 | Resp 14 | Ht 63.0 in | Wt 157.0 lb

## 2018-05-18 DIAGNOSIS — Z79891 Long term (current) use of opiate analgesic: Secondary | ICD-10-CM

## 2018-05-18 DIAGNOSIS — M961 Postlaminectomy syndrome, not elsewhere classified: Secondary | ICD-10-CM

## 2018-05-18 DIAGNOSIS — Z9221 Personal history of antineoplastic chemotherapy: Secondary | ICD-10-CM | POA: Diagnosis not present

## 2018-05-18 DIAGNOSIS — G620 Drug-induced polyneuropathy: Secondary | ICD-10-CM | POA: Diagnosis not present

## 2018-05-18 DIAGNOSIS — M545 Low back pain: Secondary | ICD-10-CM | POA: Insufficient documentation

## 2018-05-18 DIAGNOSIS — G4700 Insomnia, unspecified: Secondary | ICD-10-CM | POA: Diagnosis not present

## 2018-05-18 DIAGNOSIS — G8929 Other chronic pain: Secondary | ICD-10-CM | POA: Insufficient documentation

## 2018-05-18 DIAGNOSIS — M5416 Radiculopathy, lumbar region: Secondary | ICD-10-CM

## 2018-05-18 DIAGNOSIS — M79605 Pain in left leg: Secondary | ICD-10-CM | POA: Insufficient documentation

## 2018-05-18 DIAGNOSIS — G894 Chronic pain syndrome: Secondary | ICD-10-CM

## 2018-05-18 DIAGNOSIS — T451X5S Adverse effect of antineoplastic and immunosuppressive drugs, sequela: Secondary | ICD-10-CM | POA: Insufficient documentation

## 2018-05-18 DIAGNOSIS — Z760 Encounter for issue of repeat prescription: Secondary | ICD-10-CM | POA: Insufficient documentation

## 2018-05-18 DIAGNOSIS — T451X5A Adverse effect of antineoplastic and immunosuppressive drugs, initial encounter: Secondary | ICD-10-CM

## 2018-05-18 DIAGNOSIS — Z5181 Encounter for therapeutic drug level monitoring: Secondary | ICD-10-CM

## 2018-05-18 DIAGNOSIS — M79604 Pain in right leg: Secondary | ICD-10-CM | POA: Insufficient documentation

## 2018-05-18 DIAGNOSIS — F17210 Nicotine dependence, cigarettes, uncomplicated: Secondary | ICD-10-CM | POA: Insufficient documentation

## 2018-05-18 MED ORDER — HYDROCODONE-ACETAMINOPHEN 10-325 MG PO TABS: ORAL_TABLET | ORAL | 0 refills | Status: DC

## 2018-05-18 MED ORDER — NORTRIPTYLINE HCL 10 MG PO CAPS: ORAL_CAPSULE | ORAL | 3 refills | Status: DC

## 2018-05-18 NOTE — Progress Notes (Signed)
Subjective:    Patient ID: Elizabeth Torres, female    DOB: 02-18-1976, 42 y.o.   MRN: [id number]  HPI: [name] is a 42 year old female who is here for follow up appointment for chronic pain and medication refill. She states her pain is located in her lower back radiating into her bilateral lower extremities. She rates her pain 4. Her current exercise regime is walking.   [name] Morphine Equivalent is 50.00 MME. Last UDS was Performed on 04/20/2018, it was consistent.    Pain Inventory Average Pain 4 Pain Right Now 4 My pain is constant, dull and aching  In the last 24 hours, has pain interfered with the following? General activity 3 Relation with others 3 Enjoyment of life 3 What TIME of day is your pain at its worst? evening Sleep (in general) NA  Pain is worse with: inactivity Pain improves with: rest and medication Relief from Meds: 5  Mobility Do you have any goals in this area?  no  Function Do you have any goals in this area?  no  Neuro/Psych No problems in this area  Prior Studies Any changes since last visit?  no  Physicians involved in your care Any changes since last visit?  no   Family History  Problem Relation Age of Onset  . Diabetes Mother   . Hypertension Mother   . Cancer Paternal Uncle   . Breast cancer Paternal Uncle        40'S  . Breast cancer Paternal Aunt        79'S   Social History   Socioeconomic History  . Marital status: Married    Spouse name: Not on file  . Number of children: Not on file  . Years of education: Not on file  . Highest education level: Not on file  Occupational History  . Not on file  Social Needs  . Financial resource strain: Not on file  . Food insecurity:    Worry: Not on file    Inability: Not on file  . Transportation needs:    Medical: Not on file    Non-medical: Not on file  Tobacco Use  . Smoking status: Current Some Day Smoker    Packs/day: 1.00    Types: Cigarettes  . Smokeless  tobacco: Never Used  Substance and Sexual Activity  . Alcohol use: Yes    Comment: socially  . Drug use: No  . Sexual activity: Not on file  Lifestyle  . Physical activity:    Days per week: Not on file    Minutes per session: Not on file  . Stress: Not on file  Relationships  . Social connections:    Talks on phone: Not on file    Gets together: Not on file    Attends religious service: Not on file    Active member of club or organization: Not on file    Attends meetings of clubs or organizations: Not on file    Relationship status: Not on file  Other Topics Concern  . Not on file  Social History Narrative  . Not on file   Past Surgical History:  Procedure Laterality Date  . BREAST BIOPSY Right 07/2011   invasive ductal carcinoma  . 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 RAd and chemo tx  . BREAST SURGERY    . mastectomy Right    partial with lymph node dissection  . PORT  A CATH REVISION    . PORT-A-CATH REMOVAL  11-07-15   Dr Bary Castilla  . SPINE SURGERY  2008   Dr Joya Salm   Past Medical History:  Diagnosis Date  . BRCA negative   . Breast cancer ([place]) 2013   RT LUMPECTOMY with chemo and rad tx  . Personal history of chemotherapy 2013   for breast ca  . Personal history of radiation therapy 2013   F/U right breast cancer   . Radiation 2013   BREAST CA  . Status post chemotherapy 2013   BREAST CA   BP 128/86   Pulse 86   Resp 14   Ht '5\' 3"'  (1.6 m)   Wt 157 lb (71.2 kg)   SpO2 95%   BMI 27.81 kg/m   Opioid Risk Score:   Fall Risk Score:  `1  Depression screen PHQ 2/9  Depression screen [facility] 2/9 04/20/2018 12/02/2017 09/08/2017 04/06/2017 03/03/2017 03/13/2016 10/10/2015  Decreased Interest 0 0 0 0 0 0 0  Down, Depressed, Hopeless 0 0 0 0 0 0 0  PHQ - 2 Score 0 0 0 0 0 0 0  Altered sleeping - - - - - - 2  Tired, decreased energy - - - - - - 1  Change in appetite - - - - - - 0  Feeling bad or failure about yourself  - -  - - - - 0  Trouble concentrating - - - - - - 0  Moving slowly or fidgety/restless - - - - - - 0  Suicidal thoughts - - - - - - -  PHQ-9 Score - - - - - - 3  Difficult doing work/chores - - - - - - Not difficult at all    Review of Systems  Constitutional: Negative.   HENT: Negative.   Eyes: Negative.   Respiratory: Negative.   Cardiovascular: Negative.   Gastrointestinal: Negative.   Endocrine: Negative.   Genitourinary: Negative.   Musculoskeletal: Positive for back pain.  Skin: Negative.   Allergic/Immunologic: Negative.   Neurological: Negative.   Hematological: Negative.   Psychiatric/Behavioral: Negative.        Objective:   Physical Exam  Constitutional: She is oriented to person, place, and time. She appears well-developed and well-nourished.  HENT:  Head: Normocephalic and atraumatic.  Neck: Normal range of motion. Neck supple.  Cardiovascular: Normal rate and regular rhythm.  Pulmonary/Chest: Effort normal and breath sounds normal.  Musculoskeletal:  Normal Muscle Bulk and Muscle Testing Reveals: Upper Extremities: Full ROM and Muscle Strength 5/5 Back without spinal Tenderness Noted Lower Extremities: Full ROM and Muscle Strength 5/5 Arises from Table with Ease  Narrow Based Gait   Neurological: She is alert and oriented to person, place, and time.  Skin: Skin is warm and dry.  Psychiatric: She has a normal mood and affect. Her behavior is normal.  Nursing note and vitals reviewed.         Assessment & Plan:  1. Lumbar postlaminectomy syndrome status post L5-S1 fusion with chronic S1 radiculopathy.Continuecurrent medication regimen withNortriptylline10/16/2019 Refilled:Hydrocodone 10/325 mg #145-one every 6 hours as needed for pain, may take an extra tablet on work days only. 05/18/2018. We will continue the opioid monitoring program, this consists of regular clinic visits, examinations, urine drug screen, pill counts as well as use of [place] Controlled Substance reporting System. 2. 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 05/18/2018 3. Insomnia: Continuecurrent medication regimenAmbien. 05/18/2018  20 minutes of face to face patient care time was spent during this visit. All questions were encouraged and answered.  F/U in 1 month.

## 2018-06-14 ENCOUNTER — Encounter: Payer: Self-pay | Admitting: Physical Medicine & Rehabilitation

## 2018-06-14 ENCOUNTER — Ambulatory Visit (HOSPITAL_BASED_OUTPATIENT_CLINIC_OR_DEPARTMENT_OTHER): Payer: BLUE CROSS/BLUE SHIELD | Admitting: Physical Medicine & Rehabilitation

## 2018-06-14 ENCOUNTER — Encounter: Payer: BLUE CROSS/BLUE SHIELD | Attending: Physical Medicine and Rehabilitation

## 2018-06-14 VITALS — BP 124/90 | HR 94 | Ht 63.0 in | Wt 160.0 lb

## 2018-06-14 DIAGNOSIS — M961 Postlaminectomy syndrome, not elsewhere classified: Secondary | ICD-10-CM

## 2018-06-14 DIAGNOSIS — Z9221 Personal history of antineoplastic chemotherapy: Secondary | ICD-10-CM | POA: Insufficient documentation

## 2018-06-14 DIAGNOSIS — M545 Low back pain: Secondary | ICD-10-CM | POA: Diagnosis not present

## 2018-06-14 DIAGNOSIS — G620 Drug-induced polyneuropathy: Secondary | ICD-10-CM | POA: Insufficient documentation

## 2018-06-14 DIAGNOSIS — M79604 Pain in right leg: Secondary | ICD-10-CM | POA: Insufficient documentation

## 2018-06-14 DIAGNOSIS — G5600 Carpal tunnel syndrome, unspecified upper limb: Secondary | ICD-10-CM | POA: Diagnosis not present

## 2018-06-14 DIAGNOSIS — Z760 Encounter for issue of repeat prescription: Secondary | ICD-10-CM | POA: Insufficient documentation

## 2018-06-14 DIAGNOSIS — F17210 Nicotine dependence, cigarettes, uncomplicated: Secondary | ICD-10-CM | POA: Insufficient documentation

## 2018-06-14 DIAGNOSIS — T451X5S Adverse effect of antineoplastic and immunosuppressive drugs, sequela: Secondary | ICD-10-CM | POA: Insufficient documentation

## 2018-06-14 DIAGNOSIS — G8929 Other chronic pain: Secondary | ICD-10-CM | POA: Insufficient documentation

## 2018-06-14 DIAGNOSIS — T451X5A Adverse effect of antineoplastic and immunosuppressive drugs, initial encounter: Secondary | ICD-10-CM

## 2018-06-14 DIAGNOSIS — M79605 Pain in left leg: Secondary | ICD-10-CM | POA: Diagnosis not present

## 2018-06-14 MED ORDER — HYDROCODONE-ACETAMINOPHEN 10-325 MG PO TABS: ORAL_TABLET | ORAL | 0 refills | Status: DC

## 2018-06-14 NOTE — Progress Notes (Signed)
Subjective:    Patient ID: Elizabeth Torres, female    DOB: 02-23-1976, 42 y.o.   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 General activity 4 Relation with others 4 Enjoyment of life 4 What TIME of day is your pain at its worst? evening Sleep (in general) Fair  Pain is worse with: sitting, inactivity and standing Pain improves with: rest and medication Relief from Meds: 5  Mobility walk without assistance Do you have any goals in this area?  no  Function Do you have any goals in this area?  no  Neuro/Psych No problems in this area  Prior Studies Any changes since last visit?  no  Physicians involved in your care Any changes since last visit?  no   Family History  Problem Relation Age of Onset  . Diabetes Mother   . Hypertension Mother   . Cancer Paternal Uncle   . Breast cancer Paternal Uncle        40'S  . Breast cancer Paternal Aunt        81'S   Social History   Socioeconomic History  . Marital status: Married    Spouse name: Not on file  . Number of children: Not on file  . Years of education: Not on file  . Highest education level: Not on file  Occupational History  . Not on file  Social Needs  . Financial resource strain: Not on file  . Food insecurity:    Worry: Not on file    Inability: Not on file  . Transportation needs:    Medical: Not on file    Non-medical: Not on file  Tobacco Use  . Smoking status: Current Some Day Smoker    Packs/day: 1.00    Types: Cigarettes  . Smokeless tobacco: Never Used  Substance and Sexual Activity  . Alcohol use: Yes    Comment: socially  . Drug use: No  . Sexual activity: Not on file  Lifestyle  . Physical  activity:    Days per week: Not on file    Minutes per session: Not on file  . Stress: Not on file  Relationships  . Social connections:    Talks on phone: Not on file    Gets together: Not on file    Attends religious service: Not on file    Active member of club or organization: Not on file    Attends meetings of clubs or organizations: Not on file    Relationship status: Not on file  Other Topics Concern  . Not on file  Social History Narrative  . Not on file   Past Surgical History:  Procedure Laterality Date  . BREAST BIOPSY Right 07/2011   invasive ductal carcinoma  . 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 RAd and chemo tx  . BREAST SURGERY    . mastectomy Right    partial with lymph node dissection  . PORT A CATH REVISION    . PORT-A-CATH REMOVAL  11-07-15   Dr Bary Castilla  . SPINE SURGERY  2008   Dr Joya Salm   Past Medical History:  Diagnosis Date  . BRCA  negative   . Breast cancer ([place]) 2013   RT LUMPECTOMY with chemo and rad tx  . Personal history of chemotherapy 2013   for breast ca  . Personal history of radiation therapy 2013   F/U right breast cancer   . Radiation 2013   BREAST CA  . Status post chemotherapy 2013   BREAST CA   There were no vitals taken for this visit.  Opioid Risk Score:   Fall Risk Score:  `1  Depression screen PHQ 2/9  Depression screen [facility] 2/9 04/20/2018 12/02/2017 09/08/2017 04/06/2017 03/03/2017 03/13/2016 10/10/2015  Decreased Interest 0 0 0 0 0 0 0  Down, Depressed, Hopeless 0 0 0 0 0 0 0  PHQ - 2 Score 0 0 0 0 0 0 0  Altered sleeping - - - - - - 2  Tired, decreased energy - - - - - - 1  Change in appetite - - - - - - 0  Feeling bad or failure about yourself  - - - - - - 0  Trouble concentrating - - - - - - 0  Moving slowly or fidgety/restless - - - - - - 0  Suicidal thoughts - - - - - - -  PHQ-9 Score - - - - - - 3  Difficult doing work/chores - - - - - - Not difficult at all      Review of Systems  Constitutional: Negative.   HENT: Negative.   Eyes: Negative.   Respiratory: Negative.   Cardiovascular: Negative.   Gastrointestinal: Negative.   Endocrine: Negative.   Genitourinary: Negative.   Musculoskeletal: Positive for arthralgias, back pain and myalgias.  Skin: Negative.   Allergic/Immunologic: Negative.   Neurological: Negative.   Hematological: Negative.   Psychiatric/Behavioral: Negative.   All other systems reviewed and are negative.      Objective:   Physical Exam  Constitutional: She appears well-developed and well-nourished. No distress.  HENT:  Head: Normocephalic and atraumatic.  Skin: She is not diaphoretic.  Nursing note and vitals reviewed.         Assessment & Plan:   1.  Lumbar postlaminectomy syndrome with chronic postoperative pain.  She is able to work a very physical job and takes extra hydrocodone on the days that she works therefore her total monthly prescribed amount is 145 tablets Continue hydrocodone 10/325 1 p.o. 4 times daily and on work days 5 times per day Last UDS 2 months ago appropriate PMP aware reviewed no red flags Continue opioid monitoring program which consists of pill counts as well as monthly visits with nurse practitioner  2.  Neuropathy related to chemotherapy, continue nortriptyline 10 mg nightly  3.  Bilateral middle finger numbness occurs at work during certain activities as well as at night. CTS symptoms in bilateral middle finger fairly mild we discussed using wrist splints.  We discussed that an EMG can be performed at this clinic if symptoms progress.

## 2018-06-21 ENCOUNTER — Ambulatory Visit: Payer: BLUE CROSS/BLUE SHIELD | Admitting: Physical Medicine & Rehabilitation

## 2018-07-15 ENCOUNTER — Encounter: Payer: Self-pay | Admitting: Registered Nurse

## 2018-07-15 ENCOUNTER — Encounter: Payer: BLUE CROSS/BLUE SHIELD | Attending: Physical Medicine and Rehabilitation | Admitting: Registered Nurse

## 2018-07-15 ENCOUNTER — Other Ambulatory Visit: Payer: Self-pay

## 2018-07-15 VITALS — BP 135/87 | HR 104 | Ht 63.0 in | Wt 160.8 lb

## 2018-07-15 DIAGNOSIS — G8929 Other chronic pain: Secondary | ICD-10-CM | POA: Diagnosis not present

## 2018-07-15 DIAGNOSIS — T451X5A Adverse effect of antineoplastic and immunosuppressive drugs, initial encounter: Secondary | ICD-10-CM

## 2018-07-15 DIAGNOSIS — Z760 Encounter for issue of repeat prescription: Secondary | ICD-10-CM | POA: Insufficient documentation

## 2018-07-15 DIAGNOSIS — F17210 Nicotine dependence, cigarettes, uncomplicated: Secondary | ICD-10-CM | POA: Diagnosis not present

## 2018-07-15 DIAGNOSIS — M79604 Pain in right leg: Secondary | ICD-10-CM | POA: Diagnosis not present

## 2018-07-15 DIAGNOSIS — G4700 Insomnia, unspecified: Secondary | ICD-10-CM

## 2018-07-15 DIAGNOSIS — M79605 Pain in left leg: Secondary | ICD-10-CM | POA: Diagnosis not present

## 2018-07-15 DIAGNOSIS — M961 Postlaminectomy syndrome, not elsewhere classified: Secondary | ICD-10-CM | POA: Diagnosis not present

## 2018-07-15 DIAGNOSIS — M5416 Radiculopathy, lumbar region: Secondary | ICD-10-CM | POA: Diagnosis not present

## 2018-07-15 DIAGNOSIS — Z79891 Long term (current) use of opiate analgesic: Secondary | ICD-10-CM

## 2018-07-15 DIAGNOSIS — Z5181 Encounter for therapeutic drug level monitoring: Secondary | ICD-10-CM

## 2018-07-15 DIAGNOSIS — Z9221 Personal history of antineoplastic chemotherapy: Secondary | ICD-10-CM | POA: Diagnosis not present

## 2018-07-15 DIAGNOSIS — T451X5S Adverse effect of antineoplastic and immunosuppressive drugs, sequela: Secondary | ICD-10-CM | POA: Insufficient documentation

## 2018-07-15 DIAGNOSIS — G620 Drug-induced polyneuropathy: Secondary | ICD-10-CM | POA: Diagnosis not present

## 2018-07-15 DIAGNOSIS — G894 Chronic pain syndrome: Secondary | ICD-10-CM

## 2018-07-15 DIAGNOSIS — M545 Low back pain: Secondary | ICD-10-CM | POA: Insufficient documentation

## 2018-07-15 MED ORDER — ZOLPIDEM TARTRATE 5 MG PO TABS: ORAL_TABLET | ORAL | 2 refills | Status: DC

## 2018-07-15 MED ORDER — HYDROCODONE-ACETAMINOPHEN 10-325 MG PO TABS: ORAL_TABLET | ORAL | 0 refills | Status: DC

## 2018-07-15 NOTE — Progress Notes (Signed)
Subjective:    Patient ID: Elizabeth Torres, female    DOB: [birth year], 42 y.o.   MRN: [id number]  HPI: Elizabeth Torres is a 42 y.o. female who returns for follow up appointment for chronic pain and medication refill. She states her pain is located in her lower back radiating into her bilateral lower extremities. She  rates her pain 4. Her current exercise regime is walking and performing stretching exercises.  Elizabeth Torres Morphine Equivalent is 50.00 MME. Last UDS was Performed on 04/20/2018, it was consistent.   Pain Inventory Average Pain 4 Pain Right Now 4 My pain is constant, dull and aching  In the last 24 hours, has pain interfered with the following? General activity 4 Relation with others 4 Enjoyment of life 4 What TIME of day is your pain at its worst? evening Sleep (in general) Fair  Pain is worse with: sitting, inactivity and standing Pain improves with: rest and medication Relief from Meds: 6  Mobility Do you have any goals in this area?  no  Function Do you have any goals in this area?  no  Neuro/Psych No problems in this area  Prior Studies Any changes since last visit?  no  Physicians involved in your care Any changes since last visit?  no   Family History  Problem Relation Age of Onset  . Diabetes Mother   . Hypertension Mother   . Cancer Paternal Uncle   . Breast cancer Paternal Uncle        40'S  . Breast cancer Paternal Aunt        44'S   Social History   Socioeconomic History  . Marital status: Married    Spouse name: Not on file  . Number of children: Not on file  . Years of education: Not on file  . Highest education level: Not on file  Occupational History  . Not on file  Social Needs  . Financial resource strain: Not on file  . Food insecurity:    Worry: Not on file    Inability: Not on file  . Transportation needs:    Medical: Not on file    Non-medical: Not on file  Tobacco Use  . Smoking status: Current Some Day Smoker   Packs/day: 1.00    Types: Cigarettes  . Smokeless tobacco: Never Used  Substance and Sexual Activity  . Alcohol use: Yes    Comment: socially  . Drug use: No  . Sexual activity: Not on file  Lifestyle  . Physical activity:    Days per week: Not on file    Minutes per session: Not on file  . Stress: Not on file  Relationships  . Social connections:    Talks on phone: Not on file    Gets together: Not on file    Attends religious service: Not on file    Active member of club or organization: Not on file    Attends meetings of clubs or organizations: Not on file    Relationship status: Not on file  Other Topics Concern  . Not on file  Social History Narrative  . Not on file   Past Surgical History:  Procedure Laterality Date  . BREAST BIOPSY Right 07/2011   invasive ductal carcinoma  . 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 RAd and chemo tx  . BREAST SURGERY    . mastectomy Right    partial with lymph node dissection  .  PORT A CATH REVISION    . PORT-A-CATH REMOVAL  11-07-15   Dr Bary Castilla  . SPINE SURGERY  2008   Dr Joya Salm   Past Medical History:  Diagnosis Date  . BRCA negative   . Breast cancer ([place]) 2013   RT LUMPECTOMY with chemo and rad tx  . Personal history of chemotherapy 2013   for breast ca  . Personal history of radiation therapy 2013   F/U right breast cancer   . Radiation 2013   BREAST CA  . Status post chemotherapy 2013   BREAST CA   BP 135/87   Pulse (!) 104   Ht 5' 3" (1.6 m)   Wt 160 lb 12.8 oz (72.9 kg)   SpO2 98%   BMI 28.48 kg/m   Opioid Risk Score:   Fall Risk Score:  `1  Depression screen PHQ 2/9  Depression screen [facility] 2/9 07/15/2018 04/20/2018 12/02/2017 09/08/2017 04/06/2017 03/03/2017 03/13/2016  Decreased Interest 0 0 0 0 0 0 0  Down, Depressed, Hopeless 0 0 0 0 0 0 0  PHQ - 2 Score 0 0 0 0 0 0 0  Altered sleeping - - - - - - -  Tired, decreased energy - - - - - - -  Change in appetite -  - - - - - -  Feeling bad or failure about yourself  - - - - - - -  Trouble concentrating - - - - - - -  Moving slowly or fidgety/restless - - - - - - -  Suicidal thoughts - - - - - - -  PHQ-9 Score - - - - - - -  Difficult doing work/chores - - - - - - -    Review of Systems  Constitutional: Negative.   HENT: Negative.   Eyes: Negative.   Respiratory: Negative.   Cardiovascular: Negative.   Gastrointestinal: Negative.   Endocrine: Negative.   Genitourinary: Negative.   Musculoskeletal: Negative.   Skin: Negative.   Allergic/Immunologic: Negative.   Neurological: Negative.   Hematological: Negative.   Psychiatric/Behavioral: Negative.   All other systems reviewed and are negative.      Objective:   Physical Exam Vitals signs and nursing note reviewed.  Constitutional:      Appearance: Normal appearance.  Neck:     Musculoskeletal: Normal range of motion and neck supple.  Cardiovascular:     Rate and Rhythm: Normal rate and regular rhythm.  Pulmonary:     Effort: Pulmonary effort is normal.     Breath sounds: Normal breath sounds.  Musculoskeletal:     Comments: Normal Muscle Bulk and Muscle Testing Reveals:  Upper Extremities:Full  ROM and Muscle Strength 5/5 Back without spinal tenderness noted  Lower Extremities: Full ROM and Muscle Strength 5/5 Narrow Based Gait   Skin:    General: Skin is warm and dry.  Neurological:     Mental Status: She is alert and oriented to person, place, and time.  Psychiatric:        Mood and Affect: Mood normal.        Behavior: Behavior normal.           Assessment & Plan:  1. Lumbar postlaminectomy syndrome status post L5-S1 fusion with chronic S1 radiculopathy.Continuecurrent medication regimen withNortriptyline. Refilled:Hydrocodone 10/325 mg #145-one every 6 hours as needed for pain, may take an extra tablet on work days only. 07/15/2018. We will continue the opioid monitoring program, this consists of regular  clinic visits, examinations, urine  drug screen, pill counts as well as use of [place] Controlled Substance reporting System. 2. 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 07/15/2018 3. Insomnia: Continuecurrent medication regimenAmbien. 07/15/2018  20 minutes of face to face patient care time was spent during this visit. All questions were encouraged and answered.  F/U in 1 month.

## 2018-08-11 ENCOUNTER — Encounter: Payer: BLUE CROSS/BLUE SHIELD | Admitting: Registered Nurse

## 2018-08-12 ENCOUNTER — Encounter: Payer: Self-pay | Admitting: Registered Nurse

## 2018-08-12 ENCOUNTER — Encounter: Payer: BLUE CROSS/BLUE SHIELD | Attending: Physical Medicine and Rehabilitation | Admitting: Registered Nurse

## 2018-08-12 VITALS — BP 126/87 | HR 97 | Resp 14 | Ht 63.0 in | Wt 158.0 lb

## 2018-08-12 DIAGNOSIS — M5416 Radiculopathy, lumbar region: Secondary | ICD-10-CM | POA: Diagnosis not present

## 2018-08-12 DIAGNOSIS — Z9221 Personal history of antineoplastic chemotherapy: Secondary | ICD-10-CM | POA: Insufficient documentation

## 2018-08-12 DIAGNOSIS — M961 Postlaminectomy syndrome, not elsewhere classified: Secondary | ICD-10-CM

## 2018-08-12 DIAGNOSIS — Z760 Encounter for issue of repeat prescription: Secondary | ICD-10-CM | POA: Diagnosis not present

## 2018-08-12 DIAGNOSIS — M545 Low back pain: Secondary | ICD-10-CM | POA: Diagnosis not present

## 2018-08-12 DIAGNOSIS — G620 Drug-induced polyneuropathy: Secondary | ICD-10-CM | POA: Diagnosis not present

## 2018-08-12 DIAGNOSIS — F17210 Nicotine dependence, cigarettes, uncomplicated: Secondary | ICD-10-CM | POA: Insufficient documentation

## 2018-08-12 DIAGNOSIS — G8929 Other chronic pain: Secondary | ICD-10-CM | POA: Diagnosis not present

## 2018-08-12 DIAGNOSIS — M79605 Pain in left leg: Secondary | ICD-10-CM | POA: Diagnosis not present

## 2018-08-12 DIAGNOSIS — T451X5S Adverse effect of antineoplastic and immunosuppressive drugs, sequela: Secondary | ICD-10-CM | POA: Insufficient documentation

## 2018-08-12 DIAGNOSIS — M79604 Pain in right leg: Secondary | ICD-10-CM | POA: Insufficient documentation

## 2018-08-12 DIAGNOSIS — G894 Chronic pain syndrome: Secondary | ICD-10-CM

## 2018-08-12 DIAGNOSIS — G4700 Insomnia, unspecified: Secondary | ICD-10-CM | POA: Diagnosis not present

## 2018-08-12 DIAGNOSIS — Z5181 Encounter for therapeutic drug level monitoring: Secondary | ICD-10-CM

## 2018-08-12 DIAGNOSIS — T451X5A Adverse effect of antineoplastic and immunosuppressive drugs, initial encounter: Secondary | ICD-10-CM

## 2018-08-12 DIAGNOSIS — Z79891 Long term (current) use of opiate analgesic: Secondary | ICD-10-CM

## 2018-08-12 MED ORDER — HYDROCODONE-ACETAMINOPHEN 10-325 MG PO TABS: ORAL_TABLET | ORAL | 0 refills | Status: DC

## 2018-08-12 MED ORDER — NORTRIPTYLINE HCL 10 MG PO CAPS: ORAL_CAPSULE | ORAL | 3 refills | Status: DC

## 2018-08-12 NOTE — Progress Notes (Signed)
Subjective:    Patient ID: Elizabeth Torres, female    DOB: 10-24-1975, 43 y.o.   MRN: [id number]  HPI: Elizabeth Torres is a 44 y.o. female who returns for follow up appointment for chronic pain and medication refill. She states her pain is located in her lower back. She rates her pain 4. Her current exercise regime is walking and performing stretching exercises.  Ms. Michele Morphine equivalent is  48.33 MME.  Last UDS for 04/20/2018, it was consistent.   Pain Inventory Average Pain 4 Pain Right Now 4 My pain is constant, dull and aching  In the last 24 hours, has pain interfered with the following? General activity 4 Relation with others 4 Enjoyment of life 4 What TIME of day is your pain at its worst? evening Sleep (in general) Fair  Pain is worse with: sitting, inactivity and standing Pain improves with: rest and medication Relief from Meds: 8  Mobility walk without assistance Do you have any goals in this area?  no  Function Do you have any goals in this area?  no  Neuro/Psych No problems in this area  Prior Studies Any changes since last visit?  no  Physicians involved in your care Any changes since last visit?  no   Family History  Problem Relation Age of Onset  . Diabetes Mother   . Hypertension Mother   . Cancer Paternal Uncle   . Breast cancer Paternal Uncle        40'S  . Breast cancer Paternal Aunt        20'S   Social History   Socioeconomic History  . Marital status: Married    Spouse name: Not on file  . Number of children: Not on file  . Years of education: Not on file  . Highest education level: Not on file  Occupational History  . Not on file  Social Needs  . Financial resource strain: Not on file  . Food insecurity:    Worry: Not on file    Inability: Not on file  . Transportation needs:    Medical: Not on file    Non-medical: Not on file  Tobacco Use  . Smoking status: Current Some Day Smoker    Packs/day: 1.00    Types: Cigarettes    . Smokeless tobacco: Never Used  Substance and Sexual Activity  . Alcohol use: Yes    Comment: socially  . Drug use: No  . Sexual activity: Not on file  Lifestyle  . Physical activity:    Days per week: Not on file    Minutes per session: Not on file  . Stress: Not on file  Relationships  . Social connections:    Talks on phone: Not on file    Gets together: Not on file    Attends religious service: Not on file    Active member of club or organization: Not on file    Attends meetings of clubs or organizations: Not on file    Relationship status: Not on file  Other Topics Concern  . Not on file  Social History Narrative  . Not on file   Past Surgical History:  Procedure Laterality Date  . BREAST BIOPSY Right 07/2011   invasive ductal carcinoma  . 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 RAd and chemo tx  . BREAST SURGERY    . mastectomy Right    partial with lymph node dissection  . PORT  A CATH REVISION    . PORT-A-CATH REMOVAL  11-07-15   Dr Bary Castilla  . SPINE SURGERY  2008   Dr Joya Salm   Past Medical History:  Diagnosis Date  . BRCA negative   . Breast cancer ([place]) 2013   RT LUMPECTOMY with chemo and rad tx  . Personal history of chemotherapy 2013   for breast ca  . Personal history of radiation therapy 2013   F/U right breast cancer   . Radiation 2013   BREAST CA  . Status post chemotherapy 2013   BREAST CA   BP 126/87   Pulse 97   Resp 14   Ht _0  (1.6 m)   Wt 158 lb (71.7 kg)   SpO2 96%   BMI 27.99 kg/m   Opioid Risk Score:   Fall Risk Score:  `1  Depression screen PHQ 2/9  Depression screen [facility] 2/9 07/15/2018 04/20/2018 12/02/2017 09/08/2017 04/06/2017 03/03/2017 03/13/2016  Decreased Interest 0 0 0 0 0 0 0  Down, Depressed, Hopeless 0 0 0 0 0 0 0  PHQ - 2 Score 0 0 0 0 0 0 0  Altered sleeping - - - - - - -  Tired, decreased energy - - - - - - -  Change in appetite - - - - - - -  Feeling bad or failure  about yourself  - - - - - - -  Trouble concentrating - - - - - - -  Moving slowly or fidgety/restless - - - - - - -  Suicidal thoughts - - - - - - -  PHQ-9 Score - - - - - - -  Difficult doing work/chores - - - - - - -    Review of Systems  Constitutional: Negative.   HENT: Negative.   Eyes: Negative.   Respiratory: Negative.   Cardiovascular: Negative.   Gastrointestinal: Negative.   Endocrine: Negative.   Genitourinary: Positive for vaginal pain.  Musculoskeletal: Positive for back pain.  Skin: Negative.   Allergic/Immunologic: Negative.   Neurological: Negative.   Hematological: Negative.   Psychiatric/Behavioral: Negative.   All other systems reviewed and are negative.      Objective:   Physical Exam Vitals signs and nursing note reviewed.  Constitutional:      Appearance: Normal appearance.  Neck:     Musculoskeletal: Normal range of motion and neck supple.  Cardiovascular:     Rate and Rhythm: Normal rate and regular rhythm.     Pulses: Normal pulses.     Heart sounds: Normal heart sounds.  Pulmonary:     Effort: Pulmonary effort is normal.     Breath sounds: Normal breath sounds.  Musculoskeletal:     Comments: Normal Muscle Bulk and Muscle Testing Reveals:  Upper Extremities: Full ROM and Muscle Strength 5/5  Lumbar Paraspinal Tenderness: L-3-L-5 Lower Extremities: Full ROM and Muscle Strength 5/5 Arises from chair with ease Narrow Based  Gait   Skin:    General: Skin is warm and dry.  Neurological:     Mental Status: She is alert and oriented to person, place, and time.  Psychiatric:        Mood and Affect: Mood normal.        Behavior: Behavior normal.           Assessment & Plan:  1. Lumbar postlaminectomy syndrome status post L5-S1 fusion with chronic S1 radiculopathy.Continuecurrent medication regimen withNortriptyline. Refilled:Hydrocodone 10/325 mg #145-one every 6 hours as needed for pain, may  take an extra tablet on work days only.  08/12/2018. We will continue the opioid monitoring program, this consists of regular clinic visits, examinations, urine drug screen, pill counts as well as use of [place] Controlled Substance reporting System. 2. 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 Insomnia: Continuecurrent medication regimenAmbien.08/12/2018  20 minutes of face to face patient care time was spent during this visit. All questions were encouraged and answered.  F/U in 1 month.

## 2018-09-10 ENCOUNTER — Other Ambulatory Visit: Payer: Self-pay | Admitting: Registered Nurse

## 2018-09-15 ENCOUNTER — Encounter: Payer: BLUE CROSS/BLUE SHIELD | Attending: Physical Medicine and Rehabilitation | Admitting: Registered Nurse

## 2018-09-15 ENCOUNTER — Encounter: Payer: Self-pay | Admitting: Registered Nurse

## 2018-09-15 ENCOUNTER — Other Ambulatory Visit: Payer: Self-pay

## 2018-09-15 VITALS — BP 138/87 | HR 89 | Ht 63.0 in | Wt 161.0 lb

## 2018-09-15 DIAGNOSIS — F17210 Nicotine dependence, cigarettes, uncomplicated: Secondary | ICD-10-CM | POA: Diagnosis not present

## 2018-09-15 DIAGNOSIS — M79605 Pain in left leg: Secondary | ICD-10-CM | POA: Diagnosis not present

## 2018-09-15 DIAGNOSIS — Z9221 Personal history of antineoplastic chemotherapy: Secondary | ICD-10-CM | POA: Insufficient documentation

## 2018-09-15 DIAGNOSIS — G620 Drug-induced polyneuropathy: Secondary | ICD-10-CM | POA: Diagnosis not present

## 2018-09-15 DIAGNOSIS — T451X5A Adverse effect of antineoplastic and immunosuppressive drugs, initial encounter: Secondary | ICD-10-CM

## 2018-09-15 DIAGNOSIS — Z760 Encounter for issue of repeat prescription: Secondary | ICD-10-CM | POA: Insufficient documentation

## 2018-09-15 DIAGNOSIS — M961 Postlaminectomy syndrome, not elsewhere classified: Secondary | ICD-10-CM | POA: Diagnosis not present

## 2018-09-15 DIAGNOSIS — Z5181 Encounter for therapeutic drug level monitoring: Secondary | ICD-10-CM | POA: Diagnosis not present

## 2018-09-15 DIAGNOSIS — G8929 Other chronic pain: Secondary | ICD-10-CM | POA: Insufficient documentation

## 2018-09-15 DIAGNOSIS — G4700 Insomnia, unspecified: Secondary | ICD-10-CM

## 2018-09-15 DIAGNOSIS — M79604 Pain in right leg: Secondary | ICD-10-CM | POA: Diagnosis not present

## 2018-09-15 DIAGNOSIS — G894 Chronic pain syndrome: Secondary | ICD-10-CM | POA: Diagnosis not present

## 2018-09-15 DIAGNOSIS — Z79891 Long term (current) use of opiate analgesic: Secondary | ICD-10-CM | POA: Diagnosis not present

## 2018-09-15 DIAGNOSIS — M545 Low back pain: Secondary | ICD-10-CM | POA: Insufficient documentation

## 2018-09-15 DIAGNOSIS — M5416 Radiculopathy, lumbar region: Secondary | ICD-10-CM

## 2018-09-15 DIAGNOSIS — T451X5S Adverse effect of antineoplastic and immunosuppressive drugs, sequela: Secondary | ICD-10-CM | POA: Insufficient documentation

## 2018-09-15 MED ORDER — ZOLPIDEM TARTRATE 5 MG PO TABS: ORAL_TABLET | ORAL | 2 refills | Status: DC

## 2018-09-15 MED ORDER — HYDROCODONE-ACETAMINOPHEN 10-325 MG PO TABS: ORAL_TABLET | ORAL | 0 refills | Status: DC

## 2018-09-15 NOTE — Progress Notes (Signed)
Subjective:    Patient ID: Elizabeth Torres, female    DOB: May 04, 1976, 43 y.o.   MRN: [id number]  HPI: Elizabeth Torres is a 43 y.o. female who returns for follow up appointment for chronic pain and medication refill. She states her pain is located in her lower back radiating into her bilateral lower extremities. She rates her pain 4. Her current exercise regime is walking and performing stretching exercises.  Elizabeth Torres Morphine equivalent is 50.00 MME.  Last UDS was Performed on 04/20/2018 it was consistent. Oral swab was performed today.   Pain Inventory Average Pain 4 Pain Right Now 4 My pain is constant, dull and aching  In the last 24 hours, has pain interfered with the following? General activity 4 Relation with others 4 Enjoyment of life 4 What TIME of day is your pain at its worst? evening Sleep (in general) Fair  Pain is worse with: sitting, inactivity and standing Pain improves with: rest and medication Relief from Meds: 5  Mobility Do you have any goals in this area?  no  Function Do you have any goals in this area?  no  Neuro/Psych No problems in this area  Prior Studies Any changes since last visit?  no  Physicians involved in your care Any changes since last visit?  no   Family History  Problem Relation Age of Onset  . Diabetes Mother   . Hypertension Mother   . Cancer Paternal Uncle   . Breast cancer Paternal Uncle        40'S  . Breast cancer Paternal Aunt        13'S   Social History   Socioeconomic History  . Marital status: Married    Spouse name: Not on file  . Number of children: Not on file  . Years of education: Not on file  . Highest education level: Not on file  Occupational History  . Not on file  Social Needs  . Financial resource strain: Not on file  . Food insecurity:    Worry: Not on file    Inability: Not on file  . Transportation needs:    Medical: Not on file    Non-medical: Not on file  Tobacco Use  . Smoking status:  Current Some Day Smoker    Packs/day: 1.00    Types: Cigarettes  . Smokeless tobacco: Never Used  Substance and Sexual Activity  . Alcohol use: Yes    Comment: socially  . Drug use: No  . Sexual activity: Not on file  Lifestyle  . Physical activity:    Days per week: Not on file    Minutes per session: Not on file  . Stress: Not on file  Relationships  . Social connections:    Talks on phone: Not on file    Gets together: Not on file    Attends religious service: Not on file    Active member of club or organization: Not on file    Attends meetings of clubs or organizations: Not on file    Relationship status: Not on file  Other Topics Concern  . Not on file  Social History Narrative  . Not on file   Past Surgical History:  Procedure Laterality Date  . BREAST BIOPSY Right 07/2011   invasive ductal carcinoma  . 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 RAd and chemo tx  . BREAST SURGERY    . mastectomy Right  partial with lymph node dissection  . PORT A CATH REVISION    . PORT-A-CATH REMOVAL  11-07-15   Dr Elizabeth Torres  . SPINE SURGERY  2008   Dr Elizabeth Torres   Past Medical History:  Diagnosis Date  . BRCA negative   . Breast cancer ([place]) 2013   RT LUMPECTOMY with chemo and rad tx  . Personal history of chemotherapy 2013   for breast ca  . Personal history of radiation therapy 2013   F/U right breast cancer   . Radiation 2013   BREAST CA  . Status post chemotherapy 2013   BREAST CA   BP 138/87   Pulse 89   Ht '5\' 3"'  (1.6 m)   Wt 161 lb (73 kg)   SpO2 95%   BMI 28.52 kg/m   Opioid Risk Score:   Fall Risk Score:  `1  Depression screen PHQ 2/9  Depression screen [facility] 2/9 09/15/2018 07/15/2018 04/20/2018 12/02/2017 09/08/2017 04/06/2017 03/03/2017  Decreased Interest 0 0 0 0 0 0 0  Down, Depressed, Hopeless 0 0 0 0 0 0 0  PHQ - 2 Score 0 0 0 0 0 0 0  Altered sleeping - - - - - - -  Tired, decreased energy - - - - - - -  Change  in appetite - - - - - - -  Feeling bad or failure about yourself  - - - - - - -  Trouble concentrating - - - - - - -  Moving slowly or fidgety/restless - - - - - - -  Suicidal thoughts - - - - - - -  PHQ-9 Score - - - - - - -  Difficult doing work/chores - - - - - - -    Review of Systems  Constitutional: Positive for diaphoresis.  HENT: Negative.   Eyes: Negative.   Respiratory: Negative.   Cardiovascular: Negative.   Gastrointestinal: Negative.   Endocrine: Negative.   Genitourinary: Negative.   Musculoskeletal: Negative.   Skin: Negative.   Allergic/Immunologic: Negative.   Neurological: Negative.   Hematological: Negative.   Psychiatric/Behavioral: Negative.   All other systems reviewed and are negative.      Objective:   Physical Exam Vitals signs and nursing note reviewed.  Constitutional:      Appearance: Normal appearance.  Neck:     Musculoskeletal: Normal range of motion and neck supple.  Cardiovascular:     Rate and Rhythm: Normal rate and regular rhythm.     Pulses: Normal pulses.     Heart sounds: Normal heart sounds.  Pulmonary:     Effort: Pulmonary effort is normal.     Breath sounds: Normal breath sounds.  Musculoskeletal:     Comments: Normal Muscle Bulk and Muscle Testing Reveals:  Upper Extremities: Full  ROM and Muscle Strength 5/5 Back without spinal tenderness  Lower Extremities: Full ROM and Muscle Strength 5/5 Arises from Table with ease Narrow Based Gait   Skin:    General: Skin is warm and dry.  Neurological:     Mental Status: She is alert and oriented to person, place, and time.  Psychiatric:        Mood and Affect: Mood normal.        Behavior: Behavior normal.           Assessment & Plan:  1. Lumbar postlaminectomy syndrome status post L5-S1 fusion with chronic S1 radiculopathy.Continuecurrent medication regimen withNortriptyline. Refilled:Hydrocodone 10/325 mg #145-one every 6 hours as needed for pain,  may take an  extra tablet on work days only. 09/15/2018. We will continue the opioid monitoring program, this consists of regular clinic visits, examinations, urine drug screen, pill counts as well as use of [place] Controlled Substance reporting System. 2. 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 Insomnia: Continuecurrent medication regimenAmbien.09/15/2018  20 minutes of face to face patient care time was spent during this visit. All questions were encouraged and answered.  F/U in 1 month.

## 2018-09-23 LAB — DRUG TOX MONITOR 1 W/CONF, ORAL FLD
Amphetamines: NEGATIVE ng/mL (ref <10)
BUPRENORPHINE: NEGATIVE ng/mL (ref <0.10)
Barbiturates: NEGATIVE ng/mL (ref <10)
Benzodiazepines: NEGATIVE ng/mL (ref <0.50)
Cocaine: NEGATIVE ng/mL (ref <5.0)
Codeine: NEGATIVE ng/mL (ref <2.5)
Cotinine: 45.2 ng/mL — ABNORMAL HIGH (ref <5.0)
Dihydrocodeine: 6.2 ng/mL — ABNORMAL HIGH (ref <2.5)
Fentanyl: NEGATIVE ng/mL (ref <0.10)
Heroin Metabolite: NEGATIVE ng/mL (ref <1.0)
Hydrocodone: 113.5 ng/mL — ABNORMAL HIGH (ref <2.5)
Hydromorphone: NEGATIVE ng/mL (ref <2.5)
MARIJUANA: NEGATIVE ng/mL (ref <2.5)
MDMA: NEGATIVE ng/mL (ref <10)
Meprobamate: NEGATIVE ng/mL (ref <2.5)
Methadone: NEGATIVE ng/mL (ref <5.0)
Morphine: NEGATIVE ng/mL (ref <2.5)
NORHYDROCODONE: 10.6 ng/mL — AB (ref <2.5)
Naloxone: NEGATIVE ng/mL (ref <0.25)
Nicotine Metabolite: POSITIVE ng/mL — AB (ref <5.0)
Norbuprenorphine: NEGATIVE ng/mL (ref <0.50)
Noroxycodone: NEGATIVE ng/mL (ref <2.5)
Opiates: POSITIVE ng/mL — AB (ref <2.5)
Oxycodone: NEGATIVE ng/mL (ref <2.5)
Oxymorphone: NEGATIVE ng/mL (ref <2.5)
Phencyclidine: NEGATIVE ng/mL (ref <10)
Tapentadol: NEGATIVE ng/mL (ref <5.0)
Tramadol: NEGATIVE ng/mL (ref <5.0)
Zolpidem: NEGATIVE ng/mL (ref <5.0)

## 2018-09-23 LAB — DRUG TOX ALC METAB W/CON, ORAL FLD: Alcohol Metabolite: NEGATIVE ng/mL (ref <25)

## 2018-09-26 ENCOUNTER — Telehealth: Payer: Self-pay | Admitting: *Deleted

## 2018-09-26 NOTE — Telephone Encounter (Signed)
Oral swab drug screen was consistent for prescribed medications.  ?

## 2018-10-07 ENCOUNTER — Ambulatory Visit
Admission: EM | Admit: 2018-10-07 | Discharge: 2018-10-07 | Disposition: A | Discharge status: Home / Self Care | Payer: BLUE CROSS/BLUE SHIELD | Attending: Family Medicine | Admitting: Family Medicine

## 2018-10-07 ENCOUNTER — Other Ambulatory Visit: Payer: Self-pay

## 2018-10-07 ENCOUNTER — Encounter: Payer: Self-pay | Admitting: Emergency Medicine

## 2018-10-07 DIAGNOSIS — M7502 Adhesive capsulitis of left shoulder: Secondary | ICD-10-CM

## 2018-10-07 DIAGNOSIS — M25511 Pain in right shoulder: Secondary | ICD-10-CM | POA: Diagnosis not present

## 2018-10-07 DIAGNOSIS — M7542 Impingement syndrome of left shoulder: Secondary | ICD-10-CM | POA: Diagnosis not present

## 2018-10-07 NOTE — ED Provider Notes (Signed)
[facility]    CSN: [id number] Arrival date & time: 10/07/18  1204     History   Chief Complaint Chief Complaint  Patient presents with  . Shoulder Pain    left    HPI Elizabeth Torres is a 43 y.o. female.   43 yo female with a c/o left shoulder pain for the past week. Denies any falls or other injuries. States pain is worse with movement and shoulder is stiff.   The history is provided by the patient.  Shoulder Pain    Past Medical History:  Diagnosis Date  . BRCA negative   . Breast cancer ([place]) 2013   RT LUMPECTOMY with chemo and rad tx  . Personal history of chemotherapy 2013   for breast ca  . Personal history of radiation therapy 2013   F/U right breast cancer   . Radiation 2013   BREAST CA  . Status post chemotherapy 2013   BREAST CA    Patient Active Problem List   Diagnosis Date Noted  . Cervical myofascial pain syndrome 02/14/2016  . History of breast cancer 11/07/2015  . Postlaminectomy syndrome, lumbar region 04/12/2012  . Chemotherapy-induced neuropathy ([place]) 04/12/2012  . 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 ([place]) 08/07/2011    Past Surgical History:  Procedure Laterality Date  . BREAST BIOPSY Right 07/2011   invasive ductal carcinoma  . 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 RAd and chemo tx  . BREAST SURGERY    . mastectomy Right    partial with lymph node dissection  . PORT A CATH REVISION    . PORT-A-CATH REMOVAL  11-07-15   Dr Bary Castilla  . SPINE SURGERY  2008   Dr Joya Salm    OB History   No obstetric history on file.      Home Medications    Prior to Admission medications   Medication Sig Start Date End Date Taking? Authorizing Provider  HYDROcodone-acetaminophen (NORCO) 10-325 MG tablet TAKE 1 TABLET BY MOUTH EVERY 6 HOURS AS NEEDED FOR PAIN, may take an extra tablet on work days. No more than 5 a day 09/15/18  Yes Danella Sensing L, NP  ibuprofen  (ADVIL,MOTRIN) 800 MG tablet Take 800 mg by mouth every 6 (six) hours as needed (pain).  04/19/13  Yes Prueter, Santiago Glad, PA-C  nortriptyline (PAMELOR) 10 MG capsule TAKE 2 CAPSULES BY MOUTH AT BEDTIME 09/12/18  Yes Bayard Hugger, NP  zolpidem (AMBIEN) 5 MG tablet TAKE 1 TABLET BY MOUTH AT BEDTIME AS NEEDED FOR INSOMNIA 09/15/18  Yes Bayard Hugger, NP    Family History Family History  Problem Relation Age of Onset  . Diabetes Mother   . Hypertension Mother   . Cancer Paternal Uncle   . Breast cancer Paternal Uncle        40'S  . Breast cancer Paternal Aunt        70'S    Social History Social History   Tobacco Use  . Smoking status: Current Some Day Smoker    Packs/day: 1.00    Types: Cigarettes  . Smokeless tobacco: Never Used  Substance Use Topics  . Alcohol use: Yes    Comment: socially  . Drug use: No     Allergies   Penicillins; Sulfa antibiotics; Ondansetron; and Zofran [ondansetron hcl]   Review of Systems Review of Systems   Physical Exam Triage Vital Signs ED Triage Vitals  Enc Vitals Group  BP 10/07/18 1213 (!) 139/94     Pulse Rate 10/07/18 1213 97     Resp 10/07/18 1213 16     Temp 10/07/18 1213 98.1 F (36.7 C)     Temp Source 10/07/18 1213 Oral     SpO2 10/07/18 1213 97 %     Weight 10/07/18 1210 155 lb (70.3 kg)     Height 10/07/18 1210 '5\' 3"'  (1.6 m)     Head Circumference --      Peak Flow --      Pain Score 10/07/18 1209 8     Pain Loc --      Pain Edu? --      Excl. in [place]? --    No data found.  Updated Vital Signs BP (!) 139/94 (BP Location: Right Arm)   Pulse 97   Temp 98.1 F (36.7 C) (Oral)   Resp 16   Ht '5\' 3"'  (1.6 m)   Wt 70.3 kg   LMP 09/12/2018 (Approximate)   SpO2 97%   BMI 27.46 kg/m   Visual Acuity Right Eye Distance:   Left Eye Distance:   Bilateral Distance:    Right Eye Near:   Left Eye Near:    Bilateral Near:     Physical Exam Vitals signs and nursing note reviewed.  Constitutional:       General: She is not in acute distress.    Appearance: She is not toxic-appearing or diaphoretic.  Musculoskeletal:     Left shoulder: She exhibits decreased range of motion, tenderness, pain and decreased strength. She exhibits no swelling, no effusion, no crepitus, no deformity, no laceration, no spasm and normal pulse.  Neurological:     Mental Status: She is alert.      UC Treatments / Results  Labs (all labs ordered are listed, but only abnormal results are displayed) Labs Reviewed - No data to display  EKG None  Radiology No results found.  Procedures Procedures (including critical care time)  Medications Ordered in UC Medications - No data to display  Initial Impression / Assessment and Plan / UC Course  I have reviewed the triage vital signs and the nursing notes.  Pertinent labs & imaging results that were available during my care of the patient were reviewed by me and considered in my medical decision making (see chart for details).     Final Clinical Impressions(s) / UC Diagnoses   Final diagnoses:  Acute pain of right shoulder  Adhesive capsulitis of left shoulder     Discharge Instructions     Ice/heat, continue current anti-inflammatories  Follow up with orthopedist    ED Prescriptions    None     1.  diagnosis reviewed with patient 2. Recommend supportive treatment as above 3. Offered rx for prednisone however patient declined 4. Recommend follow up with orthopedist for further evaluation and management   Controlled Substance Prescriptions [place] Controlled Substance Registry consulted? Not Applicable   Norval Gable, MD 10/07/18 [phone]

## 2018-10-07 NOTE — ED Triage Notes (Signed)
Patient c/o left shoulder pain that started a week ago.  Patient states that is hurts when she tries to move it.  Patient denies injury or fall.

## 2018-10-07 NOTE — Discharge Instructions (Signed)
Ice/heat, continue current anti-inflammatories  Follow up with orthopedist

## 2018-10-14 ENCOUNTER — Encounter: Payer: BLUE CROSS/BLUE SHIELD | Attending: Physical Medicine and Rehabilitation | Admitting: Registered Nurse

## 2018-10-14 ENCOUNTER — Encounter: Payer: Self-pay | Admitting: Registered Nurse

## 2018-10-14 ENCOUNTER — Other Ambulatory Visit: Payer: Self-pay

## 2018-10-14 VITALS — BP 131/88 | HR 96 | Ht 63.0 in | Wt 161.2 lb

## 2018-10-14 DIAGNOSIS — G8929 Other chronic pain: Secondary | ICD-10-CM | POA: Diagnosis not present

## 2018-10-14 DIAGNOSIS — Z9221 Personal history of antineoplastic chemotherapy: Secondary | ICD-10-CM | POA: Insufficient documentation

## 2018-10-14 DIAGNOSIS — T451X5S Adverse effect of antineoplastic and immunosuppressive drugs, sequela: Secondary | ICD-10-CM | POA: Insufficient documentation

## 2018-10-14 DIAGNOSIS — T451X5A Adverse effect of antineoplastic and immunosuppressive drugs, initial encounter: Secondary | ICD-10-CM

## 2018-10-14 DIAGNOSIS — F17210 Nicotine dependence, cigarettes, uncomplicated: Secondary | ICD-10-CM | POA: Insufficient documentation

## 2018-10-14 DIAGNOSIS — G620 Drug-induced polyneuropathy: Secondary | ICD-10-CM | POA: Diagnosis not present

## 2018-10-14 DIAGNOSIS — G894 Chronic pain syndrome: Secondary | ICD-10-CM | POA: Diagnosis not present

## 2018-10-14 DIAGNOSIS — Z79891 Long term (current) use of opiate analgesic: Secondary | ICD-10-CM

## 2018-10-14 DIAGNOSIS — M961 Postlaminectomy syndrome, not elsewhere classified: Secondary | ICD-10-CM | POA: Insufficient documentation

## 2018-10-14 DIAGNOSIS — M5416 Radiculopathy, lumbar region: Secondary | ICD-10-CM

## 2018-10-14 DIAGNOSIS — Z5181 Encounter for therapeutic drug level monitoring: Secondary | ICD-10-CM

## 2018-10-14 DIAGNOSIS — M79605 Pain in left leg: Secondary | ICD-10-CM | POA: Diagnosis not present

## 2018-10-14 DIAGNOSIS — M79604 Pain in right leg: Secondary | ICD-10-CM | POA: Diagnosis not present

## 2018-10-14 DIAGNOSIS — Z760 Encounter for issue of repeat prescription: Secondary | ICD-10-CM | POA: Insufficient documentation

## 2018-10-14 DIAGNOSIS — M545 Low back pain: Secondary | ICD-10-CM | POA: Diagnosis not present

## 2018-10-14 DIAGNOSIS — G4700 Insomnia, unspecified: Secondary | ICD-10-CM

## 2018-10-14 MED ORDER — HYDROCODONE-ACETAMINOPHEN 10-325 MG PO TABS: ORAL_TABLET | ORAL | 0 refills | Status: DC

## 2018-10-14 NOTE — Progress Notes (Signed)
Subjective:    Patient ID: Elizabeth Torres, female    DOB: 01-23-1976, 43 y.o.   MRN: [id number]  HPI: Elizabeth Torres is a 43 y.o. female who returns for follow up appointment for chronic pain and medication refill. She states her pain is located in her left shoulder and lower back pain radiating into her bilateral lower extremities. She  rates her pain 4. Her current exercise regime is walking and performing stretching exercises.  [name] went to [facility] on 10/07/2018 for left shoulder pain she received a cortisone injection she reports, not was reviewed.   [name] Morphine equivalent is 48.33 MME.  Last Oral Swab was performed on 09/26/2018, it was consistent.    Pain Inventory Average Pain 4 Pain Right Now 4 My pain is constant and aching  In the last 24 hours, has pain interfered with the following? General activity 4 Relation with others 4 Enjoyment of life 3 What TIME of day is your pain at its worst? evening Sleep (in general) Fair  Pain is worse with: sitting, inactivity, standing and some activites Pain improves with: rest and medication Relief from Meds: na  Mobility Do you have any goals in this area?  no  Function Do you have any goals in this area?  no  Neuro/Psych No problems in this area  Prior Studies Any changes since last visit?  no  Physicians involved in your care Any changes since last visit?  no   Family History  Problem Relation Age of Onset  . Diabetes Mother   . Hypertension Mother   . Cancer Paternal Uncle   . Breast cancer Paternal Uncle        40'S  . Breast cancer Paternal Aunt        71'S   Social History   Socioeconomic History  . Marital status: Married    Spouse name: Not on file  . Number of children: Not on file  . Years of education: Not on file  . Highest education level: Not on file  Occupational History  . Not on file  Social Needs  . Financial resource strain: Not on file  . Food  insecurity:    Worry: Not on file    Inability: Not on file  . Transportation needs:    Medical: Not on file    Non-medical: Not on file  Tobacco Use  . Smoking status: Current Some Day Smoker    Packs/day: 1.00    Types: Cigarettes  . Smokeless tobacco: Never Used  Substance and Sexual Activity  . Alcohol use: Yes    Comment: socially  . Drug use: No  . Sexual activity: Not on file  Lifestyle  . Physical activity:    Days per week: Not on file    Minutes per session: Not on file  . Stress: Not on file  Relationships  . Social connections:    Talks on phone: Not on file    Gets together: Not on file    Attends religious service: Not on file    Active member of club or organization: Not on file    Attends meetings of clubs or organizations: Not on file    Relationship status: Not on file  Other Topics Concern  . Not on file  Social History Narrative  . Not on file   Past Surgical History:  Procedure Laterality Date  . BREAST BIOPSY Right 07/2011   invasive ductal carcinoma  . BREAST  LUMPECTOMY Right 08/2011   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 RAd and chemo tx  . BREAST SURGERY    . mastectomy Right    partial with lymph node dissection  . PORT A CATH REVISION    . PORT-A-CATH REMOVAL  11-07-15   Dr Bary Castilla  . SPINE SURGERY  2008   Dr Joya Salm   Past Medical History:  Diagnosis Date  . BRCA negative   . Breast cancer ([place]) 2013   RT LUMPECTOMY with chemo and rad tx  . Personal history of chemotherapy 2013   for breast ca  . Personal history of radiation therapy 2013   F/U right breast cancer   . Radiation 2013   BREAST CA  . Status post chemotherapy 2013   BREAST CA   BP 131/88   Pulse 96   Ht '5\' 3"'  (1.6 m)   Wt 161 lb 3.2 oz (73.1 kg)   SpO2 95%   BMI 28.56 kg/m   Opioid Risk Score:   Fall Risk Score:  `1  Depression screen PHQ 2/9  Depression screen [facility] 2/9 10/14/2018 09/15/2018 07/15/2018 04/20/2018 12/02/2017 09/08/2017 04/06/2017   Decreased Interest 0 0 0 0 0 0 0  Down, Depressed, Hopeless 0 0 0 0 0 0 0  PHQ - 2 Score 0 0 0 0 0 0 0  Altered sleeping - - - - - - -  Tired, decreased energy - - - - - - -  Change in appetite - - - - - - -  Feeling bad or failure about yourself  - - - - - - -  Trouble concentrating - - - - - - -  Moving slowly or fidgety/restless - - - - - - -  Suicidal thoughts - - - - - - -  PHQ-9 Score - - - - - - -  Difficult doing work/chores - - - - - - -   Review of Systems  Constitutional: Negative.   HENT: Negative.   Eyes: Negative.   Respiratory: Negative.   Cardiovascular: Negative.   Gastrointestinal: Negative.   Endocrine: Negative.   Genitourinary: Negative.   Musculoskeletal: Negative.   Skin: Negative.   Allergic/Immunologic: Negative.   Neurological: Negative.   Hematological: Negative.   Psychiatric/Behavioral: Negative.   All other systems reviewed and are negative.      Objective:   Physical Exam Vitals signs and nursing note reviewed.  Constitutional:      Appearance: Normal appearance.  Neck:     Musculoskeletal: Normal range of motion and neck supple.  Cardiovascular:     Rate and Rhythm: Normal rate and regular rhythm.     Pulses: Normal pulses.     Heart sounds: Normal heart sounds.  Pulmonary:     Effort: Pulmonary effort is normal.     Breath sounds: Normal breath sounds.  Musculoskeletal:     Comments: Normal Muscle Bulk and Muscle Testing Reveals:  Upper Extremities:Right: Full ROM and Muscle Strength 5/5  Left: Decreased  ROM 30 Degrees and Muscle Strength 4/5 Left AC Joint Tenderness Back without spinal tenderness noted Lower Extremities: Full ROM and Muscle Strength 5/5 Arises from Table with ease Narrow Based Gait   Skin:    General: Skin is warm and dry.  Neurological:     Mental Status: She is alert and oriented to person, place, and time.  Psychiatric:        Mood and Affect: Mood normal.  Behavior: Behavior normal.            Assessment & Plan:  1. Lumbar postlaminectomy syndrome status post L5-S1 fusion with chronic S1 radiculopathy.Continuecurrent medication regimen withNortriptyline. Refilled:Hydrocodone 10/325 mg #145-one every 6 hours as needed for pain, may take an extra tablet on work days only. 10/14/2018. We will continue the opioid monitoring program, this consists of regular clinic visits, examinations, urine drug screen, pill counts as well as use of [place] Controlled Substance reporting System. 2. 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 Insomnia: Continuecurrent medication regimenAmbien.10/14/2018  20 minutes of face to face patient care time was spent during this visit. All questions were encouraged and answered.  F/U in 1 month.

## 2018-10-26 ENCOUNTER — Inpatient Hospital Stay: Payer: BLUE CROSS/BLUE SHIELD | Admitting: Oncology

## 2018-11-15 ENCOUNTER — Encounter: Payer: Self-pay | Admitting: Registered Nurse

## 2018-11-15 ENCOUNTER — Encounter: Payer: BLUE CROSS/BLUE SHIELD | Attending: Physical Medicine and Rehabilitation | Admitting: Registered Nurse

## 2018-11-15 ENCOUNTER — Other Ambulatory Visit: Payer: Self-pay

## 2018-11-15 VITALS — Ht 63.0 in | Wt 161.0 lb

## 2018-11-15 DIAGNOSIS — M5418 Radiculopathy, sacral and sacrococcygeal region: Secondary | ICD-10-CM | POA: Diagnosis not present

## 2018-11-15 DIAGNOSIS — G894 Chronic pain syndrome: Secondary | ICD-10-CM

## 2018-11-15 DIAGNOSIS — Z79891 Long term (current) use of opiate analgesic: Secondary | ICD-10-CM

## 2018-11-15 DIAGNOSIS — M5416 Radiculopathy, lumbar region: Secondary | ICD-10-CM

## 2018-11-15 DIAGNOSIS — Z981 Arthrodesis status: Secondary | ICD-10-CM

## 2018-11-15 DIAGNOSIS — G8929 Other chronic pain: Secondary | ICD-10-CM | POA: Insufficient documentation

## 2018-11-15 DIAGNOSIS — G4700 Insomnia, unspecified: Secondary | ICD-10-CM

## 2018-11-15 DIAGNOSIS — M79604 Pain in right leg: Secondary | ICD-10-CM | POA: Insufficient documentation

## 2018-11-15 DIAGNOSIS — M961 Postlaminectomy syndrome, not elsewhere classified: Secondary | ICD-10-CM

## 2018-11-15 DIAGNOSIS — G620 Drug-induced polyneuropathy: Secondary | ICD-10-CM | POA: Diagnosis not present

## 2018-11-15 DIAGNOSIS — Z760 Encounter for issue of repeat prescription: Secondary | ICD-10-CM | POA: Insufficient documentation

## 2018-11-15 DIAGNOSIS — M545 Low back pain: Secondary | ICD-10-CM | POA: Insufficient documentation

## 2018-11-15 DIAGNOSIS — Z9221 Personal history of antineoplastic chemotherapy: Secondary | ICD-10-CM | POA: Insufficient documentation

## 2018-11-15 DIAGNOSIS — F17210 Nicotine dependence, cigarettes, uncomplicated: Secondary | ICD-10-CM | POA: Insufficient documentation

## 2018-11-15 DIAGNOSIS — M79605 Pain in left leg: Secondary | ICD-10-CM | POA: Insufficient documentation

## 2018-11-15 DIAGNOSIS — T451X5A Adverse effect of antineoplastic and immunosuppressive drugs, initial encounter: Secondary | ICD-10-CM

## 2018-11-15 DIAGNOSIS — Z5181 Encounter for therapeutic drug level monitoring: Secondary | ICD-10-CM

## 2018-11-15 DIAGNOSIS — T451X5S Adverse effect of antineoplastic and immunosuppressive drugs, sequela: Secondary | ICD-10-CM | POA: Insufficient documentation

## 2018-11-15 MED ORDER — HYDROCODONE-ACETAMINOPHEN 10-325 MG PO TABS: ORAL_TABLET | ORAL | 0 refills | Status: DC

## 2018-11-15 NOTE — Progress Notes (Signed)
Subjective:    Patient ID: Elizabeth Torres, female    DOB: 07-05-1976, 43 y.o.   MRN: [id number]  HPI: Elizabeth Torres is a 43 y.o. female  her appointment was changed, due to national recommendations of social distancing due to [place] 19, an audio/video telehealth visit is felt to be most appropriate for this patient at this time.  See Chart message from today for the patient's consent to telehealth from [place].     She states her pain is located in her lower back radiating into her bilateral lower extremities. She rates her pain 4. Her current exercise regime is walking and performing stretching exercises.  Elizabeth Torres Morphine equivalent is 48.33 MME.  Last Oral Swab was Performed on 09/15/2018, it was consistent.   Elizabeth Torres CMA asked the Health and History Questions. This provider and Elizabeth Torres verified we were speaking with the correct person using two identifiers.  Pain Inventory Average Pain 4 Pain Right Now 4 My pain is constant, dull and aching  In the last 24 hours, has pain interfered with the following? General activity 3 Relation with others 3 Enjoyment of life 3 What TIME of day is your pain at its worst? evening Sleep (in general) Fair  Pain is worse with: standing and some activites Pain improves with: rest and medication Relief from Meds: 8  Mobility walk without assistance how many minutes can you walk? 10 ability to climb steps?  yes do you drive?  yes  Function employed # of hrs/week 40  Neuro/Psych No problems in this area  Prior Studies Any changes since last visit?  no  Physicians involved in your care Any changes since last visit?  no   Family History  Problem Relation Age of Onset  . Diabetes Mother   . Hypertension Mother   . Cancer Paternal Uncle   . Breast cancer Paternal Uncle        40'S  . Breast cancer Paternal Aunt        63'S   Social History   Socioeconomic History  . Marital  status: Married    Spouse name: Not on file  . Number of children: Not on file  . Years of education: Not on file  . Highest education level: Not on file  Occupational History  . Not on file  Social Needs  . Financial resource strain: Not on file  . Food insecurity:    Worry: Not on file    Inability: Not on file  . Transportation needs:    Medical: Not on file    Non-medical: Not on file  Tobacco Use  . Smoking status: Current Some Day Smoker    Packs/day: 1.00    Types: Cigarettes  . Smokeless tobacco: Never Used  Substance and Sexual Activity  . Alcohol use: Yes    Comment: socially  . Drug use: No  . Sexual activity: Not on file  Lifestyle  . Physical activity:    Days per week: Not on file    Minutes per session: Not on file  . Stress: Not on file  Relationships  . Social connections:    Talks on phone: Not on file    Gets together: Not on file    Attends religious service: Not on file    Active member of club or organization: Not on file    Attends meetings of clubs or organizations: Not on file    Relationship status: Not on  file  Other Topics Concern  . Not on file  Social History Narrative  . Not on file   Past Surgical History:  Procedure Laterality Date  . BREAST BIOPSY Right 07/2011   invasive ductal carcinoma  . 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 RAd and chemo tx  . BREAST SURGERY    . mastectomy Right    partial with lymph node dissection  . PORT A CATH REVISION    . PORT-A-CATH REMOVAL  11-07-15   Dr Elizabeth Torres  . SPINE SURGERY  2008   Dr Elizabeth Torres   Past Medical History:  Diagnosis Date  . BRCA negative   . Breast cancer ([place]) 2013   RT LUMPECTOMY with chemo and rad tx  . Personal history of chemotherapy 2013   for breast ca  . Personal history of radiation therapy 2013   F/U right breast cancer   . Radiation 2013   BREAST CA  . Status post chemotherapy 2013   BREAST CA   Ht '5\' 3"'  (1.6 m)    Wt 161 lb (73 kg)   BMI 28.52 kg/m   Opioid Risk Score:   Fall Risk Score:  `1  Depression screen PHQ 2/9  Depression screen [facility] 2/9 10/14/2018 09/15/2018 07/15/2018 04/20/2018 12/02/2017 09/08/2017 04/06/2017  Decreased Interest 0 0 0 0 0 0 0  Down, Depressed, Hopeless 0 0 0 0 0 0 0  PHQ - 2 Score 0 0 0 0 0 0 0  Altered sleeping - - - - - - -  Tired, decreased energy - - - - - - -  Change in appetite - - - - - - -  Feeling bad or failure about yourself  - - - - - - -  Trouble concentrating - - - - - - -  Moving slowly or fidgety/restless - - - - - - -  Suicidal thoughts - - - - - - -  PHQ-9 Score - - - - - - -  Difficult doing work/chores - - - - - - -    Review of Systems  Constitutional: Negative.   HENT: Negative.   Eyes: Negative.   Respiratory: Negative.   Cardiovascular: Negative.   Gastrointestinal: Negative.   Endocrine: Negative.   Genitourinary: Negative.   Musculoskeletal: Positive for back pain.  Skin: Negative.   Allergic/Immunologic: Negative.   Neurological: Negative.   Hematological: Negative.   Psychiatric/Behavioral: Negative.        Objective:   Physical Exam Vitals signs and nursing note reviewed.  Musculoskeletal:     Comments: No Physical Exam Performed: Virtual Visit  Neurological:     Mental Status: She is oriented to person, place, and time.           Assessment & Plan:  1. Lumbar postlaminectomy syndrome status post L5-S1 fusion with chronic S1 radiculopathy.Continuecurrent medication regimen withNortriptyline. Refilled:Hydrocodone 10/325 mg #145-one every 6 hours as needed for pain, may take an extra tablet on work days only. 11/14/2018. We will continue the opioid monitoring program, this consists of regular clinic visits, examinations, urine drug screen, pill counts as well as use of [place] Controlled Substance reporting System. 2. 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 Insomnia: Continuecurrent medication regimenAmbien.11/14/2018  F/U in 1 month. Telephone Call  Location of patient: In her Home Location of provider: Office Established patient Time spent on call:10 Minutes

## 2018-12-14 ENCOUNTER — Ambulatory Visit: Payer: BLUE CROSS/BLUE SHIELD | Admitting: Oncology

## 2018-12-15 ENCOUNTER — Other Ambulatory Visit: Payer: Self-pay

## 2018-12-15 ENCOUNTER — Encounter: Payer: BLUE CROSS/BLUE SHIELD | Attending: Physical Medicine and Rehabilitation | Admitting: Registered Nurse

## 2018-12-15 ENCOUNTER — Encounter: Payer: Self-pay | Admitting: Registered Nurse

## 2018-12-15 VITALS — Ht 63.0 in | Wt 160.0 lb

## 2018-12-15 DIAGNOSIS — T451X5A Adverse effect of antineoplastic and immunosuppressive drugs, initial encounter: Secondary | ICD-10-CM

## 2018-12-15 DIAGNOSIS — G620 Drug-induced polyneuropathy: Secondary | ICD-10-CM | POA: Diagnosis not present

## 2018-12-15 DIAGNOSIS — M79604 Pain in right leg: Secondary | ICD-10-CM | POA: Insufficient documentation

## 2018-12-15 DIAGNOSIS — Z79891 Long term (current) use of opiate analgesic: Secondary | ICD-10-CM

## 2018-12-15 DIAGNOSIS — G894 Chronic pain syndrome: Secondary | ICD-10-CM | POA: Diagnosis not present

## 2018-12-15 DIAGNOSIS — M7582 Other shoulder lesions, left shoulder: Secondary | ICD-10-CM

## 2018-12-15 DIAGNOSIS — Z5181 Encounter for therapeutic drug level monitoring: Secondary | ICD-10-CM

## 2018-12-15 DIAGNOSIS — Z760 Encounter for issue of repeat prescription: Secondary | ICD-10-CM | POA: Insufficient documentation

## 2018-12-15 DIAGNOSIS — M778 Other enthesopathies, not elsewhere classified: Secondary | ICD-10-CM

## 2018-12-15 DIAGNOSIS — M5416 Radiculopathy, lumbar region: Secondary | ICD-10-CM | POA: Diagnosis not present

## 2018-12-15 DIAGNOSIS — M545 Low back pain: Secondary | ICD-10-CM | POA: Insufficient documentation

## 2018-12-15 DIAGNOSIS — F17210 Nicotine dependence, cigarettes, uncomplicated: Secondary | ICD-10-CM | POA: Insufficient documentation

## 2018-12-15 DIAGNOSIS — Z9221 Personal history of antineoplastic chemotherapy: Secondary | ICD-10-CM | POA: Insufficient documentation

## 2018-12-15 DIAGNOSIS — M79605 Pain in left leg: Secondary | ICD-10-CM | POA: Insufficient documentation

## 2018-12-15 DIAGNOSIS — M961 Postlaminectomy syndrome, not elsewhere classified: Secondary | ICD-10-CM

## 2018-12-15 DIAGNOSIS — G8929 Other chronic pain: Secondary | ICD-10-CM | POA: Insufficient documentation

## 2018-12-15 DIAGNOSIS — T451X5S Adverse effect of antineoplastic and immunosuppressive drugs, sequela: Secondary | ICD-10-CM | POA: Insufficient documentation

## 2018-12-15 DIAGNOSIS — M7542 Impingement syndrome of left shoulder: Secondary | ICD-10-CM | POA: Insufficient documentation

## 2018-12-15 MED ORDER — HYDROCODONE-ACETAMINOPHEN 10-325 MG PO TABS: ORAL_TABLET | ORAL | 0 refills | Status: DC

## 2018-12-15 NOTE — Progress Notes (Signed)
Subjective:    Patient ID: Elizabeth Torres, female    DOB: October 21, 1975, 43 y.o.   MRN: [id number]  HPI: Elizabeth Torres is a 43 y.o. female her appointment was changed, due to national recommendations of social distancing due to [place] 19, an audio/video telehealth visit is felt to be most appropriate for this patient at this time.  See Chart message from today for the patient's consent to telehealth from [place].   She states her pain is located in her left shoulder, and lower back pain radiating into her bilateral lower extremities. Also asked for left shoulder injection she was seen a [facility] on 10/07/2018, she received a Kenalog injection, she will be scheduled for injection with Dr. Letta Pate next month, she verbalizes understanding. She rates her pain 4.  Her  current exercise regime is walking and performing stretching exercises.  Ms. Townshend Morphine equivalent is 48.33 MME.  Last Oral Swab was Performed on 09/15/2018, it was consistent.   Jasmine December CMA asked the Health and History Questions. This provider and Kennon Rounds verified we were  speaking with the correct person using two identifiers.   Pain Inventory Average Pain 4 Pain Right Now 4 My pain is constant, dull and aching  In the last 24 hours, has pain interfered with the following? General activity 2 Relation with others 2 Enjoyment of life 2 What TIME of day is your pain at its worst? evening Sleep (in general) Good  Pain is worse with: inactivity Pain improves with: rest and medication Relief from Meds: 5  Mobility how many minutes can you walk? 15 ability to climb steps?  yes do you drive?  yes  Function employed # of hrs/week 50  Neuro/Psych No problems in this area  Prior Studies Any changes since last visit?  no  Physicians involved in your care Any changes since last visit?  no   Family History  Problem Relation Age of Onset  . Diabetes Mother   .  Hypertension Mother   . Cancer Paternal Uncle   . Breast cancer Paternal Uncle        40'S  . Breast cancer Paternal Aunt        6'S   Social History   Socioeconomic History  . Marital status: Married    Spouse name: Not on file  . Number of children: Not on file  . Years of education: Not on file  . Highest education level: Not on file  Occupational History  . Not on file  Social Needs  . Financial resource strain: Not on file  . Food insecurity:    Worry: Not on file    Inability: Not on file  . Transportation needs:    Medical: Not on file    Non-medical: Not on file  Tobacco Use  . Smoking status: Current Some Day Smoker    Packs/day: 1.00    Types: Cigarettes  . Smokeless tobacco: Never Used  Substance and Sexual Activity  . Alcohol use: Yes    Comment: socially  . Drug use: No  . Sexual activity: Not on file  Lifestyle  . Physical activity:    Days per week: Not on file    Minutes per session: Not on file  . Stress: Not on file  Relationships  . Social connections:    Talks on phone: Not on file    Gets together: Not on file    Attends religious service: Not on  file    Active member of club or organization: Not on file    Attends meetings of clubs or organizations: Not on file    Relationship status: Not on file  Other Topics Concern  . Not on file  Social History Narrative  . Not on file   Past Surgical History:  Procedure Laterality Date  . BREAST BIOPSY Right 07/2011   invasive ductal carcinoma  . 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 RAd and chemo tx  . BREAST SURGERY    . mastectomy Right    partial with lymph node dissection  . PORT A CATH REVISION    . PORT-A-CATH REMOVAL  11-07-15   Dr Bary Castilla  . SPINE SURGERY  2008   Dr Joya Salm   Past Medical History:  Diagnosis Date  . BRCA negative   . Breast cancer ([place]) 2013   RT LUMPECTOMY with chemo and rad tx  . Personal history of chemotherapy  2013   for breast ca  . Personal history of radiation therapy 2013   F/U right breast cancer   . Radiation 2013   BREAST CA  . Status post chemotherapy 2013   BREAST CA   Ht '5\' 3"'  (1.6 m)   Wt 160 lb (72.6 kg)   BMI 28.34 kg/m   Opioid Risk Score:   Fall Risk Score:  `1  Depression screen PHQ 2/9  Depression screen [facility] 2/9 12/15/2018 10/14/2018 09/15/2018 07/15/2018 04/20/2018 12/02/2017 09/08/2017  Decreased Interest 0 0 0 0 0 0 0  Down, Depressed, Hopeless 0 0 0 0 0 0 0  PHQ - 2 Score 0 0 0 0 0 0 0  Altered sleeping - - - - - - -  Tired, decreased energy - - - - - - -  Change in appetite - - - - - - -  Feeling bad or failure about yourself  - - - - - - -  Trouble concentrating - - - - - - -  Moving slowly or fidgety/restless - - - - - - -  Suicidal thoughts - - - - - - -  PHQ-9 Score - - - - - - -  Difficult doing work/chores - - - - - - -    Review of Systems  Constitutional: Negative.   HENT: Negative.   Eyes: Negative.   Respiratory: Negative.   Cardiovascular: Negative.   Gastrointestinal: Negative.   Endocrine: Negative.   Genitourinary: Negative.   Musculoskeletal: Positive for back pain.       Leg pain Shoulder pain  Skin: Negative.   Allergic/Immunologic: Negative.   Neurological: Negative.   Hematological: Negative.   Psychiatric/Behavioral: Negative.   All other systems reviewed and are negative.      Objective:   Physical Exam Vitals signs and nursing note reviewed.  Musculoskeletal:     Comments: No Physical Exam Performed: Virtual Visit  Neurological:     Mental Status: She is oriented to person, place, and time.           Assessment & Plan:  1. Lumbar postlaminectomy syndrome status post L5-S1 fusion with chronic S1 radiculopathy.Continuecurrent medication regimen withNortriptyline. Refilled:Hydrocodone 10/325 mg #145-one every 6 hours as needed for pain, may take an extra tablet on work days only. 12/15/2018. We will continue the  opioid monitoring program, this consists of regular clinic visits, examinations, urine drug screen, pill counts as well as use of [place] Controlled Substance reporting System. 2.  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 Insomnia: Continuecurrent medication regimenAmbien.12/15/2018 4. Left Shoulder Tendonitis: Scheduled for cortisone injection with Dr. Letta Pate.   F/U in 1 month. Telephone Call  Location of patient: In her Home Location of provider: Office Established patient Time spent on call:15 Minutes

## 2019-01-09 DIAGNOSIS — M7542 Impingement syndrome of left shoulder: Secondary | ICD-10-CM | POA: Diagnosis not present

## 2019-01-12 DIAGNOSIS — M13812 Other specified arthritis, left shoulder: Secondary | ICD-10-CM | POA: Diagnosis not present

## 2019-01-12 DIAGNOSIS — M7542 Impingement syndrome of left shoulder: Secondary | ICD-10-CM | POA: Diagnosis not present

## 2019-01-17 ENCOUNTER — Other Ambulatory Visit: Payer: Self-pay | Admitting: Registered Nurse

## 2019-01-17 ENCOUNTER — Other Ambulatory Visit: Payer: Self-pay

## 2019-01-17 ENCOUNTER — Encounter
Payer: BC Managed Care – PPO | Attending: Physical Medicine and Rehabilitation | Admitting: Physical Medicine & Rehabilitation

## 2019-01-17 ENCOUNTER — Encounter: Payer: Self-pay | Admitting: Physical Medicine & Rehabilitation

## 2019-01-17 VITALS — BP 134/93 | HR 93 | Temp 99.0°F | Ht 63.0 in | Wt 164.0 lb

## 2019-01-17 DIAGNOSIS — M961 Postlaminectomy syndrome, not elsewhere classified: Secondary | ICD-10-CM | POA: Insufficient documentation

## 2019-01-17 DIAGNOSIS — M545 Low back pain: Secondary | ICD-10-CM | POA: Insufficient documentation

## 2019-01-17 DIAGNOSIS — T451X5S Adverse effect of antineoplastic and immunosuppressive drugs, sequela: Secondary | ICD-10-CM | POA: Diagnosis not present

## 2019-01-17 DIAGNOSIS — M79604 Pain in right leg: Secondary | ICD-10-CM | POA: Insufficient documentation

## 2019-01-17 DIAGNOSIS — Z9221 Personal history of antineoplastic chemotherapy: Secondary | ICD-10-CM | POA: Insufficient documentation

## 2019-01-17 DIAGNOSIS — G8929 Other chronic pain: Secondary | ICD-10-CM | POA: Diagnosis not present

## 2019-01-17 DIAGNOSIS — M79605 Pain in left leg: Secondary | ICD-10-CM | POA: Insufficient documentation

## 2019-01-17 DIAGNOSIS — M778 Other enthesopathies, not elsewhere classified: Secondary | ICD-10-CM

## 2019-01-17 DIAGNOSIS — M7582 Other shoulder lesions, left shoulder: Secondary | ICD-10-CM

## 2019-01-17 DIAGNOSIS — T451X5A Adverse effect of antineoplastic and immunosuppressive drugs, initial encounter: Secondary | ICD-10-CM

## 2019-01-17 DIAGNOSIS — G620 Drug-induced polyneuropathy: Secondary | ICD-10-CM | POA: Insufficient documentation

## 2019-01-17 DIAGNOSIS — Z760 Encounter for issue of repeat prescription: Secondary | ICD-10-CM | POA: Diagnosis not present

## 2019-01-17 DIAGNOSIS — M5416 Radiculopathy, lumbar region: Secondary | ICD-10-CM

## 2019-01-17 DIAGNOSIS — F17210 Nicotine dependence, cigarettes, uncomplicated: Secondary | ICD-10-CM | POA: Diagnosis not present

## 2019-01-17 MED ORDER — HYDROCODONE-ACETAMINOPHEN 10-325 MG PO TABS: ORAL_TABLET | ORAL | 0 refills | Status: DC

## 2019-01-17 NOTE — Progress Notes (Signed)
Subjective:    Patient ID: Elizabeth Torres, female    DOB: 06/20/1976, 43 y.o.   MRN: [id number]  HPI 43 yo left handed female with hx of Lumbar post lami syndrome, chronic bilateral S1 radic and chemo induced polyneuropathy whois here for f/u appt.  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   Pain Inventory Average Pain 8 Pain Right Now 7 My pain is constant, sharp, dull, stabbing and aching  In the last 24 hours, has pain interfered with the following? General activity 7 Relation with others 7 Enjoyment of life 8 What TIME of day is your pain at its worst? evening Sleep (in general) Poor  Pain is worse with: sitting, inactivity, standing and some activites Pain improves with: rest, heat/ice, medication and injections Relief from Meds: 3  Mobility walk without assistance  Function Do you have any goals in this area?  no  Neuro/Psych No problems in this area  Prior Studies Any changes since last visit?  no  Physicians involved in your care Any changes since last visit?  no   Family History  Problem Relation Age of Onset  . Diabetes Mother   . Hypertension Mother   . Cancer Paternal Uncle   . Breast cancer Paternal Uncle        40'S  . Breast cancer Paternal Aunt        644'S  Social History   Socioeconomic History  . Marital status: Married    Spouse name: Not on file  . Number of children: Not on file  . Years of education: Not on file  . Highest education level: Not on file  Occupational History  . Not on file  Social Needs  . Financial resource strain: Not on file  . Food insecurity    Worry: Not on file    Inability: Not on file  . Transportation needs    Medical: Not on file    Non-medical: Not on file  Tobacco Use  . Smoking status: Current Some Day  Smoker    Packs/day: 1.00    Types: Cigarettes  . Smokeless tobacco: Never Used  Substance and Sexual Activity  . Alcohol use: Yes    Comment: socially  . Drug use: No  . Sexual activity: Not on file  Lifestyle  . Physical activity    Days per week: Not on file    Minutes per session: Not on file  . Stress: Not on file  Relationships  . Social [place] phone: Not on file    Gets together: Not on file    Attends religious service: Not on file    Active member of club or organization: Not on file    Attends meetings of clubs or organizations: Not on file    Relationship status: Not on file  Other Topics Concern  . Not on file  Social History Narrative  . Not on file   Past Surgical History:  Procedure Laterality Date  . BREAST BIOPSY Right 07/2011   invasive ductal carcinoma  . 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 RAd and chemo tx  . BREAST SURGERY    . mastectomy Right    partial with lymph node dissection  . PORT  A CATH REVISION    . PORT-A-CATH REMOVAL  11-07-15   Dr Bary Castilla  . SPINE SURGERY  2008   Dr Joya Salm   Past Medical History:  Diagnosis Date  . BRCA negative   . Breast cancer ([facility]) 2013   RT LUMPECTOMY with chemo and rad tx  . Personal history of chemotherapy 2013   for breast ca  . Personal history of radiation therapy 2013   F/U right breast cancer   . Radiation 2013   BREAST CA  . Status post chemotherapy 2013   BREAST CA   BP (!) 134/93   Pulse 93   Temp 99 F (37.2 C)   Ht '5\' 3"'  (1.6 m)   Wt 164 lb (74.4 kg)   LMP 01/09/2019   SpO2 95%   BMI 29.05 kg/m   Opioid Risk Score:   Fall Risk Score:  `1  Depression screen PHQ 2/9  Depression screen [facility] 2/9 12/15/2018 10/14/2018 09/15/2018 07/15/2018 04/20/2018 12/02/2017 09/08/2017  Decreased Interest 0 0 0 0 0 0 0  Down, Depressed, Hopeless 0 0 0 0 0 0 0  PHQ - 2 Score 0 0 0 0 0 0 0  Altered sleeping - - - - - - -  Tired, decreased  energy - - - - - - -  Change in appetite - - - - - - -  Feeling bad or failure about yourself  - - - - - - -  Trouble concentrating - - - - - - -  Moving slowly or fidgety/restless - - - - - - -  Suicidal thoughts - - - - - - -  PHQ-9 Score - - - - - - -  Difficult doing work/chores - - - - - - -     Review of Systems  Constitutional: Negative.   HENT: Negative.   Eyes: Negative.   Respiratory: Negative.   Cardiovascular: Negative.   Gastrointestinal: Negative.   Endocrine: Negative.   Genitourinary: Negative.   Musculoskeletal: Positive for arthralgias, back pain and myalgias.  Skin: Negative.   Allergic/Immunologic: Negative.   Neurological: Negative.   Hematological: Negative.   Psychiatric/Behavioral: Negative.   All other systems reviewed and are negative.      Objective:   Physical Exam Vitals signs and nursing note reviewed.  Constitutional:      Appearance: Normal appearance. She is normal weight.  HENT:     Mouth/Throat:     Mouth: Mucous membranes are moist.  Musculoskeletal:     Left shoulder: She exhibits decreased range of motion, tenderness, deformity and decreased strength.     Left hip: She exhibits tenderness. She exhibits normal range of motion and no bony tenderness.     Lumbar back: She exhibits decreased range of motion. She exhibits no tenderness and no deformity.  Neurological:     Mental Status: She is alert and oriented to person, place, and time.     Sensory: Sensory deficit present.     Coordination: Coordination is intact.     Gait: Gait is intact.     Comments: Left L4 and S1 sensory deficit to LT  Can do single leg stance for 2-3 seconds on each side  Psychiatric:        Mood and Affect: Mood normal.        Behavior: Behavior normal.     Left S1 numbess to LT Left glut medius with tenderness to palpation       Assessment & Plan:  1.  Lumbar post laminectomy syndrome Pt has chronic low back pain well managed on her current  dose of hydrocodone 10/325 up to 5 tabs per day, 145 tab per month   2.  Chemo induce polyneuropathy as well as chronic Left S1 radiculopathy.  Controlled on nortriptyline 2m Qhs  3.  Left rotator cuff impingement, managed by Orthopedics, will have subacromial decompression in July or August.  Recommend that pt takes her current pain medication and have Orthopedist prescribe 5-7 d supply of Oxycodone 524mfor breakthrough pain post op.

## 2019-01-17 NOTE — Patient Instructions (Signed)
If you have surgery in the next month , please let us knw.  I would rec contuing your current dose of hydrocodne and having your surgeon prescribe oxycodone for on week on top of your hydrocodone as needed.

## 2019-01-21 NOTE — Progress Notes (Deleted)
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   INTERVAL HISTORY: Patient returns to clinic today for routine yearly follow-up.  She recently had pneumonia with positive blood cultures, but this is now resolved and she is back to her baseline.  She currently feels well and is asymptomatic. She has no neurologic complaints.  She denies any recent fevers. She has a good appetite and denies weight loss. She denies any chest pain or shortness of breath.  She has no nausea, vomiting, constipation, or diarrhea.  She has no urinary complaints.  Patient offers no further specific complaints today.  REVIEW OF SYSTEMS:   Review of Systems  Constitutional: Negative.  Negative for fever, malaise/fatigue and weight loss.  Respiratory: Negative.  Negative for cough and shortness of breath.   Cardiovascular: Negative.  Negative for chest pain and leg swelling.  Gastrointestinal: Negative.  Negative for abdominal pain.  Genitourinary: Negative.   Musculoskeletal: Negative.   Skin: Negative.  Negative for rash.  Neurological: Negative.  Negative for sensory change, focal weakness and weakness.  Psychiatric/Behavioral: The patient is nervous/anxious.     As per HPI. Otherwise, a complete review of systems is negative.  PAST MEDICAL HISTORY: Past Medical History:  Diagnosis Date  . BRCA negative   . Breast cancer ([facility]) 2013   RT LUMPECTOMY with chemo and rad tx  . Personal history of chemotherapy 2013   for breast ca  . Personal history of radiation therapy 2013   F/U right breast cancer   . Radiation 2013   BREAST CA   . Status post chemotherapy 2013   BREAST CA    PAST SURGICAL HISTORY: Past Surgical History:  Procedure Laterality Date  . BREAST BIOPSY Right 07/2011   invasive ductal carcinoma  . 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 RAd and chemo tx  . BREAST SURGERY    . mastectomy Right    partial with lymph node dissection  . PORT A CATH REVISION    . PORT-A-CATH REMOVAL  11-07-15   Dr Byrnett  . SPINE SURGERY  2008   Dr Botero    FAMILY HISTORY Family History  Problem Relation Age of Onset  . Diabetes Mother   . Hypertension Mother   . Cancer Paternal Uncle   . Breast cancer Paternal Uncle        40'S  . Breast cancer Paternal Aunt        60'S       ADVANCED DIRECTIVES:    HEALTH MAINTENANCE: Social History   Tobacco Use  . Smoking status: Current Some Day Smoker    Packs/day: 1.00    Types: Cigarettes  . Smokeless tobacco: Never Used  Substance Use Topics  . Alcohol use: Yes    Comment: socially  . Drug use: No     Colonoscopy:  PAP:  Bone density:  Lipid panel:  Allergies  Allergen Reactions  . Penicillins Anaphylaxis  . Sulfa Antibiotics Anaphylaxis  . Ondansetron     Other reaction(s): Other (See Comments) migraines  . Zofran [Ondansetron Hcl]     Current Outpatient Medications  Medication Sig Dispense   Refill  . HYDROcodone-acetaminophen (NORCO) 10-325 MG tablet TAKE 1 TABLET BY MOUTH EVERY 6 HOURS AS NEEDED FOR PAIN, may take an extra tablet on work days. No more than 5 a day 145 tablet 0  . ibuprofen (ADVIL,MOTRIN) 800 MG tablet Take 800 mg by mouth every 6 (six) hours as needed (pain).     . nortriptyline (PAMELOR) 10 MG capsule TAKE 2 CAPSULES BY MOUTH AT BEDTIME 180 capsule 2  . zolpidem (AMBIEN) 5 MG tablet TAKE 1 TABLET BY MOUTH EVERY DAY AT BEDTIME AS NEEDED FOR INSOMNIA 30 tablet 3   No current facility-administered medications for this visit.     OBJECTIVE: There were no vitals filed for  this visit.   There is no height or weight on file to calculate BMI.    ECOG FS:0 - Asymptomatic  General: Well-developed, well-nourished, no acute distress. Eyes: Pink conjunctiva, anicteric sclera. Breasts: Bilateral breast and axilla without lumps or masses. Lungs: Clear to auscultation bilaterally. Heart: Regular rate and rhythm. No rubs, murmurs, or gallops. Abdomen: Soft, nontender, nondistended. No organomegaly noted, normoactive bowel sounds. Musculoskeletal: No edema, cyanosis, or clubbing. Neuro: Alert, answering all questions appropriately. Cranial nerves grossly intact. Skin: No rashes or petechiae noted. Psych: Normal affect.   LAB RESULTS:  Lab Results  Component Value Date   NA 138 10/13/2017   K 4.2 10/13/2017   CL 102 10/13/2017   CO2 26 10/13/2017   GLUCOSE 152 (H) 10/13/2017   BUN 15 10/13/2017   CREATININE 0.67 10/13/2017   CALCIUM 9.9 10/13/2017   PROT 8.1 10/13/2017   ALBUMIN 4.3 10/13/2017   AST 26 10/13/2017   ALT 35 10/13/2017   ALKPHOS 70 10/13/2017   BILITOT 0.5 10/13/2017   GFRNONAA >60 10/13/2017   GFRAA >60 10/13/2017    Lab Results  Component Value Date   WBC 10.7 10/13/2017   NEUTROABS 9.7 (H) 10/09/2017   HGB 14.6 10/13/2017   HCT 42.1 10/13/2017   MCV 93.5 10/13/2017   PLT 334 10/13/2017     STUDIES: No results found.  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   PLAN:    1. 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  Patient completed chemotherapy with AC-Taxol in July 2013. She completed adjuvant XRT in October 2013. Patient's most recent mammogram and ultrasound on November 03, 2016 was reported as BI-RADS 2.  Repeat in April 2019.  Her CA-27-29 continues to be within normal limits at 36.9.  She does not require breast MRI at this time. She does not require tamoxifen given the ER/PR status of her tumor.  Return to clinic in 1 year for routine evaluation.   2.  Pneumonia/positive blood cultures: Resolved.  Approximately 20 minutes was spent in discussion of which greater than 50% was consultation.  Patient expressed understanding and was in agreement with this plan. She also understands that She can call clinic at any time with any questions, concerns, or complaints.   Timothy J Finnegan, MD   01/21/2019 7:35 AM      

## 2019-01-25 ENCOUNTER — Inpatient Hospital Stay: Payer: BC Managed Care – PPO | Admitting: Oncology

## 2019-01-27 ENCOUNTER — Inpatient Hospital Stay: Payer: BC Managed Care – PPO | Attending: Oncology | Admitting: Oncology

## 2019-01-27 ENCOUNTER — Encounter: Payer: Self-pay | Admitting: Oncology

## 2019-01-27 ENCOUNTER — Other Ambulatory Visit: Payer: Self-pay

## 2019-01-27 VITALS — BP 139/96 | HR 84 | Temp 97.2°F | Resp 18 | Wt 163.4 lb

## 2019-01-27 DIAGNOSIS — Z882 Allergy status to sulfonamides status: Secondary | ICD-10-CM | POA: Diagnosis not present

## 2019-01-27 DIAGNOSIS — Z803 Family history of malignant neoplasm of breast: Secondary | ICD-10-CM | POA: Insufficient documentation

## 2019-01-27 DIAGNOSIS — Z923 Personal history of irradiation: Secondary | ICD-10-CM | POA: Insufficient documentation

## 2019-01-27 DIAGNOSIS — Z8249 Family history of ischemic heart disease and other diseases of the circulatory system: Secondary | ICD-10-CM | POA: Diagnosis not present

## 2019-01-27 DIAGNOSIS — F17210 Nicotine dependence, cigarettes, uncomplicated: Secondary | ICD-10-CM | POA: Diagnosis not present

## 2019-01-27 DIAGNOSIS — Z880 Allergy status to penicillin: Secondary | ICD-10-CM | POA: Diagnosis not present

## 2019-01-27 DIAGNOSIS — Z9221 Personal history of antineoplastic chemotherapy: Secondary | ICD-10-CM | POA: Diagnosis not present

## 2019-01-27 DIAGNOSIS — Z171 Estrogen receptor negative status [ER-]: Secondary | ICD-10-CM

## 2019-01-27 DIAGNOSIS — Z79899 Other long term (current) drug therapy: Secondary | ICD-10-CM | POA: Insufficient documentation

## 2019-01-27 DIAGNOSIS — Z888 Allergy status to other drugs, medicaments and biological substances status: Secondary | ICD-10-CM | POA: Insufficient documentation

## 2019-01-27 DIAGNOSIS — C50919 Malignant neoplasm of unspecified site of unspecified female breast: Secondary | ICD-10-CM

## 2019-01-27 DIAGNOSIS — C50511 Malignant neoplasm of lower-outer quadrant of right female breast: Secondary | ICD-10-CM | POA: Diagnosis not present

## 2019-01-27 DIAGNOSIS — Z833 Family history of diabetes mellitus: Secondary | ICD-10-CM | POA: Diagnosis not present

## 2019-01-27 NOTE — Progress Notes (Signed)
Pt in for follow up, denies any concerns or difficulties today. 

## 2019-01-28 NOTE — Progress Notes (Signed)
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   INTERVAL HISTORY: Patient returns to clinic today for routine yearly follow-up.  She currently feels well and is asymptomatic. She has no neurologic complaints.  She denies any recent fevers or illnesses.  She has a good appetite and denies weight loss.  She denies any chest pain, shortness of breath, cough, or hemoptysis.  She has no nausea, vomiting, constipation, or diarrhea.  She has no urinary complaints.  Patient feels at her baseline offers no specific complaints today.  REVIEW OF SYSTEMS:   Review of Systems  Constitutional: Negative.  Negative for fever, malaise/fatigue and weight loss.  Respiratory: Negative.  Negative for cough and shortness of breath.   Cardiovascular: Negative.  Negative for chest pain and leg swelling.  Gastrointestinal: Negative.  Negative for abdominal pain.  Genitourinary: Negative.  Negative for dysuria.  Musculoskeletal: Negative.  Negative for back pain.  Skin: Negative.  Negative for rash.  Neurological: Negative.  Negative for dizziness, sensory change, focal weakness, weakness and headaches.  Psychiatric/Behavioral: Negative.  The patient is not nervous/anxious.     As per HPI. Otherwise, a complete review of systems is negative.  PAST MEDICAL HISTORY: Past Medical History:  Diagnosis Date  . BRCA negative   . Breast cancer ([facility]) 2013   RT LUMPECTOMY with chemo and rad tx  . Personal history of chemotherapy 2013   for breast ca  . Personal history of radiation therapy 2013   F/U right breast cancer   . Radiation  2013   BREAST CA  . Status post chemotherapy 2013   BREAST CA    PAST SURGICAL HISTORY: Past Surgical History:  Procedure Laterality Date  . BREAST BIOPSY Right 07/2011   invasive ductal carcinoma  . 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 RAd and chemo tx  . BREAST SURGERY    . mastectomy Right    partial with lymph node dissection  . PORT A CATH REVISION    . PORT-A-CATH REMOVAL  11-07-15   Dr Bary Castilla  . SPINE SURGERY  2008   Dr Joya Salm    FAMILY HISTORY Family History  Problem Relation Age of Onset  . Diabetes Mother   . Hypertension Mother   . Cancer Paternal Uncle   . Breast cancer Paternal Uncle        40'S  . Breast cancer Paternal Aunt        76'S       ADVANCED DIRECTIVES:    HEALTH MAINTENANCE: Social History   Tobacco Use  . Smoking status: Current Some Day Smoker    Packs/day: 1.00    Types: Cigarettes  . Smokeless tobacco: Never Used  Substance Use Topics  . Alcohol use: Yes    Comment: socially  . Drug use: No     Colonoscopy:  PAP:  Bone density:  Lipid panel:  Allergies  Allergen Reactions  . Penicillins Anaphylaxis  . Sulfa Antibiotics Anaphylaxis  . Ondansetron     Other reaction(s): Other (See Comments) migraines  . Zofran [Ondansetron Hcl]     Current Outpatient Medications  Medication Sig Dispense  Refill  . HYDROcodone-acetaminophen (NORCO) 10-325 MG tablet TAKE 1 TABLET BY MOUTH EVERY 6 HOURS AS NEEDED FOR PAIN, may take an extra tablet on work days. No more than 5 a day 145 tablet 0  . ibuprofen (ADVIL,MOTRIN) 800 MG tablet Take 800 mg by mouth every 6 (six) hours as needed (pain).     . nortriptyline (PAMELOR) 10 MG capsule TAKE 2 CAPSULES BY MOUTH AT BEDTIME 180 capsule 2  . zolpidem (AMBIEN) 5 MG tablet TAKE 1 TABLET BY MOUTH EVERY DAY AT BEDTIME AS NEEDED FOR INSOMNIA 30 tablet 3   No current facility-administered medications for this visit.     OBJECTIVE: Vitals:    01/27/19 0845  BP: (!) 139/96  Pulse: 84  Resp: 18  Temp: (!) 97.2 F (36.2 C)     Body mass index is 28.94 kg/m.    ECOG FS:0 - Asymptomatic  General: Well-developed, well-nourished, no acute distress. Eyes: Pink conjunctiva, anicteric sclera. HEENT: Normocephalic, moist mucous membranes. Breast: Patient declined breast exam today. Lungs: Clear to auscultation bilaterally. Heart: Regular rate and rhythm. No rubs, murmurs, or gallops. Abdomen: Soft, nontender, nondistended. No organomegaly noted, normoactive bowel sounds. Musculoskeletal: No edema, cyanosis, or clubbing. Neuro: Alert, answering all questions appropriately. Cranial nerves grossly intact. Skin: No rashes or petechiae noted. Psych: Normal affect.   LAB RESULTS:  Lab Results  Component Value Date   NA 138 10/13/2017   K 4.2 10/13/2017   CL 102 10/13/2017   CO2 26 10/13/2017   GLUCOSE 152 (H) 10/13/2017   BUN 15 10/13/2017   CREATININE 0.67 10/13/2017   CALCIUM 9.9 10/13/2017   PROT 8.1 10/13/2017   ALBUMIN 4.3 10/13/2017   AST 26 10/13/2017   ALT 35 10/13/2017   ALKPHOS 70 10/13/2017   BILITOT 0.5 10/13/2017   GFRNONAA >60 10/13/2017   GFRAA >60 10/13/2017    Lab Results  Component Value Date   WBC 10.7 10/13/2017   NEUTROABS 9.7 (H) 10/09/2017   HGB 14.6 10/13/2017   HCT 42.1 10/13/2017   MCV 93.5 10/13/2017   PLT 334 10/13/2017     STUDIES: No results found.  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   PLAN:    1. 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  Patient completed chemotherapy with AC-Taxol in July 2013. She completed adjuvant XRT in October 2013.  Patient's most recent mammogram on Dec 10, 2017 was reported as BI-RADS 2.  Patient will require mammogram in the next 1 to 2 weeks.  Her most recent CA 27-29 in March 2019 was within normal limits at 36.9.   She does not require breast MRI at this time. She does not require tamoxifen given the ER/PR status of her tumor.  Return to clinic in 1 year for routine evaluation.  I spent a total of 20 minutes face-to-face with the patient of which greater than 50% of the visit was spent in counseling and coordination of care as detailed above.   Patient expressed understanding and was in agreement with this plan. She also understands that She can call clinic at any time with any questions, concerns, or complaints.   Lloyd Huger, MD   01/28/2019 7:50 AM

## 2019-01-30 NOTE — Addendum Note (Signed)
Addended by: Wayna Chalet on: 01/30/2019 08:47 AM   Modules accepted: Orders

## 2019-02-05 ENCOUNTER — Other Ambulatory Visit: Payer: Self-pay | Admitting: Registered Nurse

## 2019-02-14 ENCOUNTER — Encounter: Payer: Self-pay | Admitting: Registered Nurse

## 2019-02-14 ENCOUNTER — Other Ambulatory Visit: Payer: Self-pay

## 2019-02-14 ENCOUNTER — Encounter: Payer: BC Managed Care – PPO | Attending: Physical Medicine and Rehabilitation | Admitting: Registered Nurse

## 2019-02-14 VITALS — BP 142/93 | HR 95 | Temp 97.7°F | Ht 63.0 in | Wt 163.0 lb

## 2019-02-14 DIAGNOSIS — Z760 Encounter for issue of repeat prescription: Secondary | ICD-10-CM | POA: Diagnosis not present

## 2019-02-14 DIAGNOSIS — G8929 Other chronic pain: Secondary | ICD-10-CM | POA: Insufficient documentation

## 2019-02-14 DIAGNOSIS — G620 Drug-induced polyneuropathy: Secondary | ICD-10-CM | POA: Insufficient documentation

## 2019-02-14 DIAGNOSIS — G894 Chronic pain syndrome: Secondary | ICD-10-CM | POA: Diagnosis not present

## 2019-02-14 DIAGNOSIS — Z5181 Encounter for therapeutic drug level monitoring: Secondary | ICD-10-CM

## 2019-02-14 DIAGNOSIS — F17210 Nicotine dependence, cigarettes, uncomplicated: Secondary | ICD-10-CM | POA: Insufficient documentation

## 2019-02-14 DIAGNOSIS — T451X5S Adverse effect of antineoplastic and immunosuppressive drugs, sequela: Secondary | ICD-10-CM | POA: Insufficient documentation

## 2019-02-14 DIAGNOSIS — M79605 Pain in left leg: Secondary | ICD-10-CM | POA: Insufficient documentation

## 2019-02-14 DIAGNOSIS — M5416 Radiculopathy, lumbar region: Secondary | ICD-10-CM | POA: Diagnosis not present

## 2019-02-14 DIAGNOSIS — M961 Postlaminectomy syndrome, not elsewhere classified: Secondary | ICD-10-CM | POA: Diagnosis not present

## 2019-02-14 DIAGNOSIS — Z79891 Long term (current) use of opiate analgesic: Secondary | ICD-10-CM

## 2019-02-14 DIAGNOSIS — M545 Low back pain: Secondary | ICD-10-CM | POA: Diagnosis not present

## 2019-02-14 DIAGNOSIS — M79604 Pain in right leg: Secondary | ICD-10-CM | POA: Diagnosis not present

## 2019-02-14 DIAGNOSIS — Z9221 Personal history of antineoplastic chemotherapy: Secondary | ICD-10-CM | POA: Insufficient documentation

## 2019-02-14 DIAGNOSIS — T451X5A Adverse effect of antineoplastic and immunosuppressive drugs, initial encounter: Secondary | ICD-10-CM

## 2019-02-14 MED ORDER — HYDROCODONE-ACETAMINOPHEN 10-325 MG PO TABS: ORAL_TABLET | ORAL | 0 refills | Status: DC

## 2019-02-14 NOTE — Progress Notes (Signed)
Subjective:    Patient ID: Elizabeth Torres, female    DOB: 24-Apr-1976, 43 y.o.   MRN: [id number]  HPI: Elizabeth Torres is a 43 y.o. female who returns for follow up appointment for chronic pain and medication refill. She states her pain is located in her left shoulder and lower back pain radiating into her bilateral lower extremities. She rates her pain 5. Her current exercise regime is walking and performing yoga exercises.  [name] Morphine equivalent is 48.33MME.  Last Oral Swab was Performed on 09/15/2018, it was consistent.   Pain Inventory Average Pain 5 Pain Right Now 5 My pain is constant and dull  In the last 24 hours, has pain interfered with the following? General activity 5 Relation with others 5 Enjoyment of life 5 What TIME of day is your pain at its worst? morning, evening Sleep (in general) Poor  Pain is worse with: walking, sitting and inactivity Pain improves with: rest, medication and injections Relief from Meds: 3  Mobility Do you have any goals in this area?  no  Function Do you have any goals in this area?  no  Neuro/Psych No problems in this area  Prior Studies no  Physicians involved in your care no   Family History  Problem Relation Age of Onset  . Diabetes Mother   . Hypertension Mother   . Cancer Paternal Uncle   . Breast cancer Paternal Uncle        40'S  . Breast cancer Paternal Aunt        87'S   Social History   Socioeconomic History  . Marital status: Married    Spouse name: Not on file  . Number of children: Not on file  . Years of education: Not on file  . Highest education level: Not on file  Occupational History  . Not on file  Social Needs  . Financial resource strain: Not on file  . Food insecurity    Worry: Not on file    Inability: Not on file  . Transportation needs    Medical: Not on file    Non-medical: Not on file  Tobacco Use  . Smoking status: Current Some Day Smoker    Packs/day: 1.00    Types:  Cigarettes  . Smokeless tobacco: Never Used  Substance and Sexual Activity  . Alcohol use: Yes    Comment: socially  . Drug use: No  . Sexual activity: Not on file  Lifestyle  . Physical activity    Days per week: Not on file    Minutes per session: Not on file  . Stress: Not on file  Relationships  . Social Herbalist on phone: Not on file    Gets together: Not on file    Attends religious service: Not on file    Active member of club or organization: Not on file    Attends meetings of clubs or organizations: Not on file    Relationship status: Not on file  Other Topics Concern  . Not on file  Social History Narrative  . Not on file   Past Surgical History:  Procedure Laterality Date  . BREAST BIOPSY Right 07/2011   invasive ductal carcinoma  . 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 RAd and chemo tx  . BREAST SURGERY    . mastectomy Right    partial with lymph node dissection  . PORT A CATH REVISION    .  PORT-A-CATH REMOVAL  11-07-15   Dr Bary Castilla  . SPINE SURGERY  2008   Dr Joya Salm   Past Medical History:  Diagnosis Date  . BRCA negative   . Breast cancer ([place]) 2013   RT LUMPECTOMY with chemo and rad tx  . Personal history of chemotherapy 2013   for breast ca  . Personal history of radiation therapy 2013   F/U right breast cancer   . Radiation 2013   BREAST CA  . Status post chemotherapy 2013   BREAST CA   There were no vitals taken for this visit.  Opioid Risk Score:   Fall Risk Score:  `1  Depression screen PHQ 2/9  Depression screen [facility] 2/9 12/15/2018 10/14/2018 09/15/2018 07/15/2018 04/20/2018 12/02/2017 09/08/2017  Decreased Interest 0 0 0 0 0 0 0  Down, Depressed, Hopeless 0 0 0 0 0 0 0  PHQ - 2 Score 0 0 0 0 0 0 0  Altered sleeping - - - - - - -  Tired, decreased energy - - - - - - -  Change in appetite - - - - - - -  Feeling bad or failure about yourself  - - - - - - -  Trouble concentrating - -  - - - - -  Moving slowly or fidgety/restless - - - - - - -  Suicidal thoughts - - - - - - -  PHQ-9 Score - - - - - - -  Difficult doing work/chores - - - - - - -       Review of Systems  All other systems reviewed and are negative.      Objective:   Physical Exam Vitals signs and nursing note reviewed.  Constitutional:      Appearance: Normal appearance.  Neck:     Musculoskeletal: Normal range of motion and neck supple.  Cardiovascular:     Rate and Rhythm: Normal rate and regular rhythm.     Pulses: Normal pulses.     Heart sounds: Normal heart sounds.  Pulmonary:     Effort: Pulmonary effort is normal.     Breath sounds: Normal breath sounds.  Musculoskeletal:     Comments: Normal Muscle Bulk and Muscle Testing Reveals:  Upper Extremities: Right: Full ROM and Muscle Strength 5/5 Left: Decreased ROM 90 Degrees and Muscle Strength 5/5 Back without spinal tenderness noted Lower Extremities: Full ROM and Muscle Strength 5/5 Arises from chair with ease Narrow Based Gait   Skin:    General: Skin is warm and dry.  Neurological:     Mental Status: She is alert.  Psychiatric:        Mood and Affect: Mood normal.        Behavior: Behavior normal.           Assessment & Plan:  1. Lumbar postlaminectomy syndrome status post L5-S1 fusion with chronic S1 radiculopathy.Continuecurrent medication regimen withNortriptyline. Refilled:Hydrocodone 10/325 mg #145-one every 6 hours as needed for pain, may take an extra tablet on work days only. 02/14/2019. We will continue the opioid monitoring program, this consists of regular clinic visits, examinations, urine drug screen, pill counts as well as use of [place] Controlled Substance reporting System. 2. 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 Insomnia: Continuecurrent medication regimenAmbien.02/14/2019  F/U  in 1 month.  15  minutes of face to face patient care time was spent during this visit. All questions were encouraged and answered.

## 2019-03-14 ENCOUNTER — Other Ambulatory Visit: Payer: Self-pay | Admitting: Registered Nurse

## 2019-03-17 ENCOUNTER — Telehealth: Payer: Self-pay | Admitting: Registered Nurse

## 2019-03-17 MED ORDER — HYDROCODONE-ACETAMINOPHEN 10-325 MG PO TABS: ORAL_TABLET | ORAL | 0 refills | Status: DC

## 2019-03-17 NOTE — Telephone Encounter (Signed)
Hydrocodone e-scribed to accommodate scheduled appointment.

## 2019-03-24 IMAGING — US US BREAST*L* LIMITED INC AXILLA
1 series · 1 of 1 positions shown · non-contrast
Comparison: Previous exam(s).

CLINICAL DATA: Right lumpectomy.  Annual mammography.

EXAM:
DIGITAL DIAGNOSTIC BILATERAL MAMMOGRAM WITH CAD AND TOMO
ULTRASOUND LEFT BREAST

[Series 1: us breast*left* limited inc axilla · 0.07mm/px · 1 of 1 slices shown]
[im 1/1]
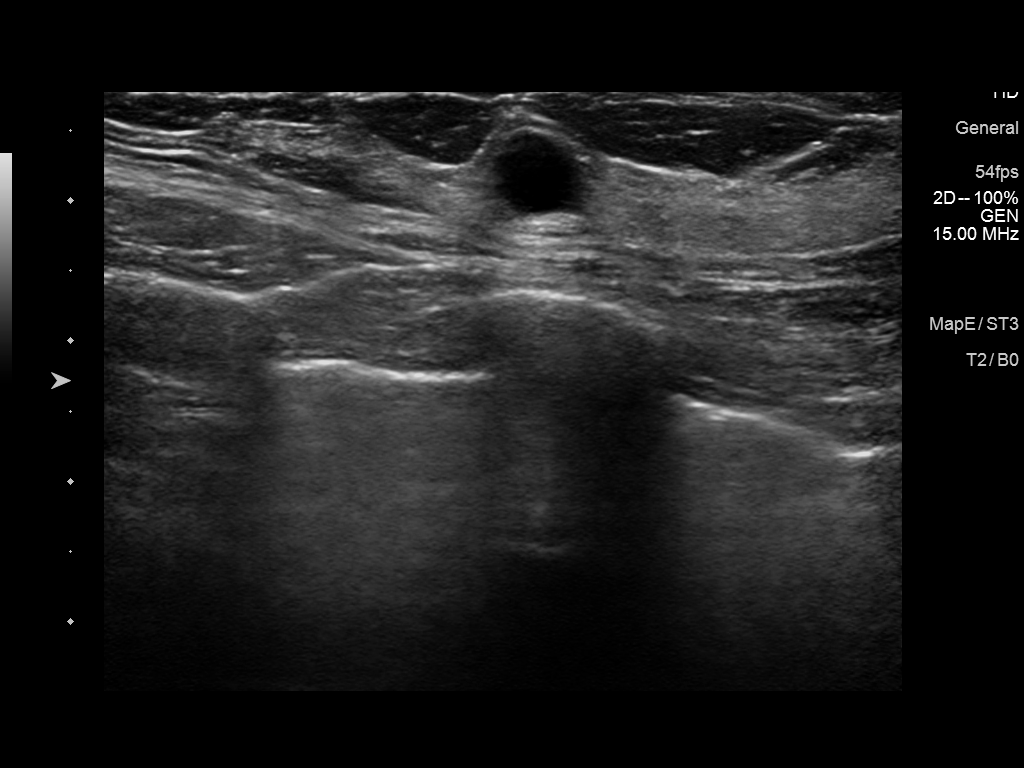

[1 of 1 positions shown; findings below may reference images not displayed]

ACR Breast Density Category b: There are scattered areas of
fibroglandular density.
FINDINGS: There is a new mass in the upper outer left breast. The right
lumpectomy site is stable. No other suspicious findings in either
breast.

Mammographic images were processed with CAD.

On physical exam, no suspicious lumps identified.

Targeted ultrasound is performed, showing fibrocystic changes are
identified accounting for the mammographic findings on the left.
IMPRESSION: Fibrocystic changes on the left. Lumpectomy changes on the right. No
evidence of malignancy.

RECOMMENDATION:
Annual screening mammography.

I have discussed the findings and recommendations with the patient.
Results were also provided in writing at the conclusion of the
visit. If applicable, a reminder letter will be sent to the patient
regarding the next appointment.

BI-RADS CATEGORY  2: Benign.

## 2019-03-31 ENCOUNTER — Other Ambulatory Visit: Payer: Self-pay

## 2019-03-31 ENCOUNTER — Encounter: Payer: BC Managed Care – PPO | Attending: Physical Medicine and Rehabilitation | Admitting: Registered Nurse

## 2019-03-31 ENCOUNTER — Encounter: Payer: Self-pay | Admitting: Registered Nurse

## 2019-03-31 VITALS — BP 149/94 | HR 86 | Temp 98.5°F | Ht 63.0 in | Wt 161.0 lb

## 2019-03-31 DIAGNOSIS — T451X5S Adverse effect of antineoplastic and immunosuppressive drugs, sequela: Secondary | ICD-10-CM | POA: Insufficient documentation

## 2019-03-31 DIAGNOSIS — G894 Chronic pain syndrome: Secondary | ICD-10-CM

## 2019-03-31 DIAGNOSIS — F17210 Nicotine dependence, cigarettes, uncomplicated: Secondary | ICD-10-CM | POA: Insufficient documentation

## 2019-03-31 DIAGNOSIS — M542 Cervicalgia: Secondary | ICD-10-CM

## 2019-03-31 DIAGNOSIS — G8929 Other chronic pain: Secondary | ICD-10-CM | POA: Insufficient documentation

## 2019-03-31 DIAGNOSIS — Z760 Encounter for issue of repeat prescription: Secondary | ICD-10-CM | POA: Diagnosis not present

## 2019-03-31 DIAGNOSIS — Z5181 Encounter for therapeutic drug level monitoring: Secondary | ICD-10-CM

## 2019-03-31 DIAGNOSIS — M5416 Radiculopathy, lumbar region: Secondary | ICD-10-CM

## 2019-03-31 DIAGNOSIS — M7582 Other shoulder lesions, left shoulder: Secondary | ICD-10-CM

## 2019-03-31 DIAGNOSIS — M5412 Radiculopathy, cervical region: Secondary | ICD-10-CM

## 2019-03-31 DIAGNOSIS — Z79891 Long term (current) use of opiate analgesic: Secondary | ICD-10-CM

## 2019-03-31 DIAGNOSIS — M79604 Pain in right leg: Secondary | ICD-10-CM | POA: Insufficient documentation

## 2019-03-31 DIAGNOSIS — M961 Postlaminectomy syndrome, not elsewhere classified: Secondary | ICD-10-CM | POA: Diagnosis not present

## 2019-03-31 DIAGNOSIS — M7918 Myalgia, other site: Secondary | ICD-10-CM

## 2019-03-31 DIAGNOSIS — M545 Low back pain: Secondary | ICD-10-CM | POA: Insufficient documentation

## 2019-03-31 DIAGNOSIS — Z9221 Personal history of antineoplastic chemotherapy: Secondary | ICD-10-CM | POA: Insufficient documentation

## 2019-03-31 DIAGNOSIS — M79605 Pain in left leg: Secondary | ICD-10-CM | POA: Insufficient documentation

## 2019-03-31 DIAGNOSIS — M7581 Other shoulder lesions, right shoulder: Secondary | ICD-10-CM

## 2019-03-31 DIAGNOSIS — G620 Drug-induced polyneuropathy: Secondary | ICD-10-CM | POA: Diagnosis not present

## 2019-03-31 DIAGNOSIS — M778 Other enthesopathies, not elsewhere classified: Secondary | ICD-10-CM

## 2019-03-31 DIAGNOSIS — T451X5A Adverse effect of antineoplastic and immunosuppressive drugs, initial encounter: Secondary | ICD-10-CM

## 2019-03-31 MED ORDER — ZOLPIDEM TARTRATE 5 MG PO TABS: ORAL_TABLET | ORAL | 3 refills | Status: DC

## 2019-03-31 NOTE — Progress Notes (Signed)
Subjective:    Patient ID: Elizabeth Torres, female    DOB: 17-Sep-1975, 43 y.o.   MRN: [id number]  HPI: Elizabeth Torres is a 43 y.o. female who returns for follow up appointment for chronic pain and medication refill. She states her pain is located in her neck radiating into her bilateral shoulders and lower back pain radiating into her bilateral lower extremities. She rates her pain 5. Her current exercise regime is walking and performing stretching exercises.  Ms. [place] Morphine equivalent is 48.33  MME.  Last Oral Swab was Performed on 09/15/2018, it was consistent. Oral Swab was Performed today.   Pain Inventory Average Pain 6 Pain Right Now 5 My pain is constant, dull and aching  In the last 24 hours, has pain interfered with the following? General activity 4 Relation with others 4 Enjoyment of life 5 What TIME of day is your pain at its worst? evening Sleep (in general) Poor  Pain is worse with: walking, bending, sitting, inactivity, standing and some activites Pain improves with: rest, heat/ice and medication Relief from Meds:   Mobility Do you have any goals in this area?  no  Function Do you have any goals in this area?  no  Neuro/Psych No problems in this area  Prior Studies Any changes since last visit?  no  Physicians involved in your care Any changes since last visit?  no   Family History  Problem Relation Age of Onset  . Diabetes Mother   . Hypertension Mother   . Cancer Paternal Uncle   . Breast cancer Paternal Uncle        40'S  . Breast cancer Paternal Aunt        68'S   Social History   Socioeconomic History  . Marital status: Married    Spouse name: Not on file  . Number of children: Not on file  . Years of education: Not on file  . Highest education level: Not on file  Occupational History  . Not on file  Social Needs  . Financial resource strain: Not on file  . Food insecurity    Worry: Not on file    Inability: Not on file  .  Transportation needs    Medical: Not on file    Non-medical: Not on file  Tobacco Use  . Smoking status: Current Some Day Smoker    Packs/day: 1.00    Types: Cigarettes  . Smokeless tobacco: Never Used  Substance and Sexual Activity  . Alcohol use: Yes    Comment: socially  . Drug use: No  . Sexual activity: Not on file  Lifestyle  . Physical activity    Days per week: Not on file    Minutes per session: Not on file  . Stress: Not on file  Relationships  . Social Herbalist on phone: Not on file    Gets together: Not on file    Attends religious service: Not on file    Active member of club or organization: Not on file    Attends meetings of clubs or organizations: Not on file    Relationship status: Not on file  Other Topics Concern  . Not on file  Social History Narrative  . Not on file   Past Surgical History:  Procedure Laterality Date  . BREAST BIOPSY Right 07/2011   invasive ductal carcinoma  . 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 RAd and  chemo tx  . BREAST SURGERY    . mastectomy Right    partial with lymph node dissection  . PORT A CATH REVISION    . PORT-A-CATH REMOVAL  11-07-15   Dr Bary Castilla  . SPINE SURGERY  2008   Dr Joya Salm   Past Medical History:  Diagnosis Date  . BRCA negative   . Breast cancer ([place]) 2013   RT LUMPECTOMY with chemo and rad tx  . Personal history of chemotherapy 2013   for breast ca  . Personal history of radiation therapy 2013   F/U right breast cancer   . Radiation 2013   BREAST CA  . Status post chemotherapy 2013   BREAST CA   BP (!) 149/94   Pulse 86   Temp 98.5 F (36.9 C)   Ht '5\' 3"'  (1.6 m)   Wt 161 lb (73 kg)   SpO2 95%   BMI 28.52 kg/m   Opioid Risk Score:   Fall Risk Score:  `1  Depression screen PHQ 2/9  Depression screen [facility] 2/9 12/15/2018 10/14/2018 09/15/2018 07/15/2018 04/20/2018 12/02/2017 09/08/2017  Decreased Interest 0 0 0 0 0 0 0  Down,  Depressed, Hopeless 0 0 0 0 0 0 0  PHQ - 2 Score 0 0 0 0 0 0 0  Altered sleeping - - - - - - -  Tired, decreased energy - - - - - - -  Change in appetite - - - - - - -  Feeling bad or failure about yourself  - - - - - - -  Trouble concentrating - - - - - - -  Moving slowly or fidgety/restless - - - - - - -  Suicidal thoughts - - - - - - -  PHQ-9 Score - - - - - - -  Difficult doing work/chores - - - - - - -    Review of Systems  Constitutional: Negative.   HENT: Negative.   Eyes: Negative.   Respiratory: Negative.   Cardiovascular: Negative.   Gastrointestinal: Negative.   Endocrine: Negative.   Genitourinary: Negative.   Musculoskeletal: Negative.   Skin: Negative.   Allergic/Immunologic: Negative.   Neurological: Negative.   Hematological: Negative.   Psychiatric/Behavioral: Negative.   All other systems reviewed and are negative.      Objective:   Physical Exam Vitals signs and nursing note reviewed.  Constitutional:      Appearance: Normal appearance.  Neck:     Musculoskeletal: Normal range of motion and neck supple.  Cardiovascular:     Rate and Rhythm: Normal rate and regular rhythm.     Pulses: Normal pulses.     Heart sounds: Normal heart sounds.  Pulmonary:     Effort: Pulmonary effort is normal.     Breath sounds: Normal breath sounds.  Musculoskeletal:     Comments: Normal Muscle Bulk and Muscle Testing Reveals:  Upper Extremities: Full ROM and Muscle Strength 5/5 Bilateral AC Joint Tenderness Lumbar Paraspinal Tenderness; L-4-L-5 Lower Extremities: Full ROM and Muscle Strength 5/5 Arises from chair with ease Narrow Based Gait   Skin:    General: Skin is warm and dry.  Neurological:     Mental Status: She is alert and oriented to person, place, and time.  Psychiatric:        Mood and Affect: Mood normal.        Behavior: Behavior normal.           Assessment & Plan:  1. Lumbar  postlaminectomy syndrome status post L5-S1 fusion with  chronic S1 radiculopathy.Continuecurrent medication regimen withNortriptyline. Continue :Hydrocodone 10/325 mg #145-one every 6 hours as needed for pain, may take an extra tablet on work days only. 03/31/2019. We will continue the opioid monitoring program, this consists of regular clinic visits, examinations, urine drug screen, pill counts as well as use of [place] Controlled Substance reporting System. 2. 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 Insomnia: Continuecurrent medication regimenAmbien.03/31/2019 4.Bilateral Shoulder Tendonitis/ Impingement of Left Shoulder: Ortho Following. 03/30/2019  F/U in 1 month.

## 2019-04-04 ENCOUNTER — Ambulatory Visit
Admission: RE | Admit: 2019-04-04 | Discharge: 2019-04-04 | Disposition: A | Discharge status: Home / Self Care | Payer: BC Managed Care – PPO | Source: Ambulatory Visit | Attending: Oncology | Admitting: Oncology

## 2019-04-04 ENCOUNTER — Telehealth: Payer: Self-pay | Admitting: *Deleted

## 2019-04-04 DIAGNOSIS — C50919 Malignant neoplasm of unspecified site of unspecified female breast: Secondary | ICD-10-CM | POA: Insufficient documentation

## 2019-04-04 DIAGNOSIS — Z1231 Encounter for screening mammogram for malignant neoplasm of breast: Secondary | ICD-10-CM | POA: Diagnosis not present

## 2019-04-04 LAB — DRUG TOX MONITOR 1 W/CONF, ORAL FLD
Amphetamines: NEGATIVE ng/mL (ref <10)
Barbiturates: NEGATIVE ng/mL (ref <10)
Benzodiazepines: NEGATIVE ng/mL (ref <0.50)
Buprenorphine: NEGATIVE ng/mL (ref <0.10)
Cocaine: NEGATIVE ng/mL (ref <5.0)
Codeine: NEGATIVE ng/mL (ref <2.5)
Cotinine: 209.9 ng/mL — ABNORMAL HIGH (ref <5.0)
Dihydrocodeine: 16.7 ng/mL — ABNORMAL HIGH (ref <2.5)
Fentanyl: NEGATIVE ng/mL (ref <0.10)
Heroin Metabolite: NEGATIVE ng/mL (ref <1.0)
Hydrocodone: >250 ng/mL — ABNORMAL HIGH (ref <2.5)
Hydromorphone: NEGATIVE ng/mL (ref <2.5)
MARIJUANA: NEGATIVE ng/mL (ref <2.5)
MDMA: NEGATIVE ng/mL (ref <10)
Meprobamate: NEGATIVE ng/mL (ref <2.5)
Methadone: NEGATIVE ng/mL (ref <5.0)
Morphine: NEGATIVE ng/mL (ref <2.5)
Nicotine Metabolite: POSITIVE ng/mL — AB (ref <5.0)
Norhydrocodone: 36.8 ng/mL — ABNORMAL HIGH (ref <2.5)
Noroxycodone: NEGATIVE ng/mL (ref <2.5)
Opiates: POSITIVE ng/mL — AB (ref <2.5)
Oxycodone: NEGATIVE ng/mL (ref <2.5)
Oxymorphone: NEGATIVE ng/mL (ref <2.5)
Phencyclidine: NEGATIVE ng/mL (ref <10)
Tapentadol: NEGATIVE ng/mL (ref <5.0)
Tramadol: NEGATIVE ng/mL (ref <5.0)
Zolpidem: NEGATIVE ng/mL (ref <5.0)

## 2019-04-04 LAB — DRUG TOX ALC METAB W/CON, ORAL FLD: Alcohol Metabolite: NEGATIVE ng/mL (ref <25)

## 2019-04-04 NOTE — Telephone Encounter (Signed)
Oral swab drug screen was consistent for prescribed medications.  ?

## 2019-04-05 ENCOUNTER — Other Ambulatory Visit: Payer: Self-pay | Admitting: Oncology

## 2019-04-05 DIAGNOSIS — R928 Other abnormal and inconclusive findings on diagnostic imaging of breast: Secondary | ICD-10-CM

## 2019-04-05 DIAGNOSIS — N6320 Unspecified lump in the left breast, unspecified quadrant: Secondary | ICD-10-CM

## 2019-04-19 ENCOUNTER — Ambulatory Visit
Admission: RE | Admit: 2019-04-19 | Discharge: 2019-04-19 | Disposition: A | Discharge status: Home / Self Care | Payer: BC Managed Care – PPO | Source: Ambulatory Visit | Attending: Oncology | Admitting: Oncology

## 2019-04-19 ENCOUNTER — Telehealth: Payer: Self-pay | Admitting: *Deleted

## 2019-04-19 DIAGNOSIS — R928 Other abnormal and inconclusive findings on diagnostic imaging of breast: Secondary | ICD-10-CM

## 2019-04-19 DIAGNOSIS — N6320 Unspecified lump in the left breast, unspecified quadrant: Secondary | ICD-10-CM

## 2019-04-19 DIAGNOSIS — R922 Inconclusive mammogram: Secondary | ICD-10-CM | POA: Diagnosis not present

## 2019-04-19 DIAGNOSIS — N6002 Solitary cyst of left breast: Secondary | ICD-10-CM | POA: Diagnosis not present

## 2019-04-19 NOTE — Telephone Encounter (Signed)
Patient called reporting that shehad a mammogram and had to go back for more imaging and was told that she will needs an MRI. She asks where things go form here. Please return her call [phone]

## 2019-04-19 NOTE — Telephone Encounter (Signed)
Unclear to me. Mammo was BIRADS1 and said this:  Bilateral screening mammogram in 1 year is recommended.  The American Cancer Society recommends annual MRI and mammography in patients with an estimated lifetime risk of developing breast cancer greater than 20 - 25%, or who are known or suspected to be positive for the breast cancer gene.

## 2019-04-19 NOTE — Telephone Encounter (Signed)
Can one of you call her back?  Thanks!

## 2019-04-19 NOTE — Telephone Encounter (Signed)
She requests that you call her to discuss this, she does not return until June and she said that you told her she has an 80% chance of her cancer coming back

## 2019-04-20 ENCOUNTER — Encounter: Payer: Self-pay | Admitting: *Deleted

## 2019-04-20 NOTE — Progress Notes (Signed)
I called and talked with patient today per request of Dr. Grayland Ormond to review her questions regarding breast MRI.  Reviewed mammogram results and the statement of recommending an MRI for BRCA positive mutations, and patient's with a lifetime risk greater than 20% of getting breast cancer.  Reviewed per Dr. Gary Fleet notes her genetic test was negative for a BRCA mutation.  She did not remember having  genetic testing.  She states she was told she has an 80% chance her breast cancer would come back.  I did review that the 20% lifetime risk mentioned in her mammogram results, is based on risk models and that she has already been diagnosed with breast cancer.  Informed her that current guidelines for breast cancer surveillance is to have annual mammograms, but there are studies looking at adding in MRI's.  If guidelines should change, Dr. Grayland Ormond will make sure she gets appropriate care.  Reminded her to continue self breast awareness and if changes to call immediately.

## 2019-04-25 ENCOUNTER — Encounter: Payer: BC Managed Care – PPO | Attending: Physical Medicine and Rehabilitation | Admitting: Registered Nurse

## 2019-04-25 ENCOUNTER — Encounter: Payer: Self-pay | Admitting: Registered Nurse

## 2019-04-25 ENCOUNTER — Other Ambulatory Visit: Payer: Self-pay

## 2019-04-25 VITALS — BP 134/94 | HR 87 | Temp 97.5°F | Ht 63.0 in | Wt 162.0 lb

## 2019-04-25 DIAGNOSIS — T451X5A Adverse effect of antineoplastic and immunosuppressive drugs, initial encounter: Secondary | ICD-10-CM

## 2019-04-25 DIAGNOSIS — M961 Postlaminectomy syndrome, not elsewhere classified: Secondary | ICD-10-CM | POA: Insufficient documentation

## 2019-04-25 DIAGNOSIS — G620 Drug-induced polyneuropathy: Secondary | ICD-10-CM | POA: Insufficient documentation

## 2019-04-25 DIAGNOSIS — Z9221 Personal history of antineoplastic chemotherapy: Secondary | ICD-10-CM | POA: Diagnosis not present

## 2019-04-25 DIAGNOSIS — Z760 Encounter for issue of repeat prescription: Secondary | ICD-10-CM | POA: Diagnosis not present

## 2019-04-25 DIAGNOSIS — M5416 Radiculopathy, lumbar region: Secondary | ICD-10-CM

## 2019-04-25 DIAGNOSIS — G894 Chronic pain syndrome: Secondary | ICD-10-CM | POA: Diagnosis not present

## 2019-04-25 DIAGNOSIS — G8929 Other chronic pain: Secondary | ICD-10-CM | POA: Diagnosis not present

## 2019-04-25 DIAGNOSIS — Z79891 Long term (current) use of opiate analgesic: Secondary | ICD-10-CM

## 2019-04-25 DIAGNOSIS — M79604 Pain in right leg: Secondary | ICD-10-CM | POA: Diagnosis not present

## 2019-04-25 DIAGNOSIS — M79605 Pain in left leg: Secondary | ICD-10-CM | POA: Insufficient documentation

## 2019-04-25 DIAGNOSIS — F17210 Nicotine dependence, cigarettes, uncomplicated: Secondary | ICD-10-CM | POA: Insufficient documentation

## 2019-04-25 DIAGNOSIS — T451X5S Adverse effect of antineoplastic and immunosuppressive drugs, sequela: Secondary | ICD-10-CM | POA: Insufficient documentation

## 2019-04-25 DIAGNOSIS — M545 Low back pain: Secondary | ICD-10-CM | POA: Diagnosis not present

## 2019-04-25 DIAGNOSIS — Z5181 Encounter for therapeutic drug level monitoring: Secondary | ICD-10-CM

## 2019-04-25 DIAGNOSIS — M25512 Pain in left shoulder: Secondary | ICD-10-CM

## 2019-04-25 MED ORDER — HYDROCODONE-ACETAMINOPHEN 10-325 MG PO TABS: ORAL_TABLET | ORAL | 0 refills | Status: DC

## 2019-04-25 NOTE — Progress Notes (Signed)
Subjective:    Patient ID: Elizabeth Torres, female    DOB: 07-11-1976, 43 y.o.   MRN: [id number]  HPI: Elizabeth Torres is a 43 y.o. female who returns for follow up appointment for chronic pain and medication refill. She states her pain is located in her left shoulder and lower back pain radiating into her bilateral lower extremities. He rates his pain 7. His  current exercise regime is walking and performing stretching exercises.  Ms. Chenette is scheduled for Left Shoulder Arthroscopy with Rotator Cuff Repair on 05/18/2019 by Dr. Harlow Mares.   Ms. [place] Morphine equivalent is 48.33  MME.  Last Oral Swab was Performed on 03/31/2019, it was consistent.    Pain Inventory Average Pain 6 Pain Right Now 7 My pain is constant, dull and aching  In the last 24 hours, has pain interfered with the following? General activity 6 Relation with others 6 Enjoyment of life 6 What TIME of day is your pain at its worst? daytime, evening, night Sleep (in general) Fair  Pain is worse with: sitting, inactivity, standing and some activites Pain improves with: heat/ice and medication Relief from Meds: 5  Mobility walk without assistance Do you have any goals in this area?  no  Function employed # of hrs/week 60 Do you have any goals in this area?  no  Neuro/Psych No problems in this area  Prior Studies Any changes since last visit?  no  Physicians involved in your care Any changes since last visit?  no   Family History  Problem Relation Age of Onset  . Diabetes Mother   . Hypertension Mother   . Cancer Paternal Uncle   . Breast cancer Paternal Uncle        40'S  . Breast cancer Paternal Aunt        66'S   Social History   Socioeconomic History  . Marital status: Married    Spouse name: Not on file  . Number of children: Not on file  . Years of education: Not on file  . Highest education level: Not on file  Occupational History  . Not on file  Social Needs  . Financial resource strain:  Not on file  . Food insecurity    Worry: Not on file    Inability: Not on file  . Transportation needs    Medical: Not on file    Non-medical: Not on file  Tobacco Use  . Smoking status: Current Some Day Smoker    Packs/day: 1.00    Types: Cigarettes  . Smokeless tobacco: Never Used  Substance and Sexual Activity  . Alcohol use: Yes    Comment: socially  . Drug use: No  . Sexual activity: Not on file  Lifestyle  . Physical activity    Days per week: Not on file    Minutes per session: Not on file  . Stress: Not on file  Relationships  . Social Herbalist on phone: Not on file    Gets together: Not on file    Attends religious service: Not on file    Active member of club or organization: Not on file    Attends meetings of clubs or organizations: Not on file    Relationship status: Not on file  Other Topics Concern  . Not on file  Social History Narrative  . Not on file   Past Surgical History:  Procedure Laterality Date  . BREAST BIOPSY Right 07/2011   invasive  ductal carcinoma  . 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 RAd and chemo tx  . BREAST SURGERY    . mastectomy Right    partial with lymph node dissection  . PORT A CATH REVISION    . PORT-A-CATH REMOVAL  11-07-15   Dr Bary Castilla  . SPINE SURGERY  2008   Dr Joya Salm   Past Medical History:  Diagnosis Date  . BRCA negative   . Breast cancer ([place]) 2013   RT LUMPECTOMY with chemo and rad tx  . Personal history of chemotherapy 2013   for breast ca  . Personal history of radiation therapy 2013   F/U right breast cancer   . Radiation 2013   BREAST CA  . Status post chemotherapy 2013   BREAST CA   Pulse 98   Ht '5\' 3"'  (1.6 m)   Wt 162 lb (73.5 kg)   LMP 03/30/2019   BMI 28.70 kg/m   Opioid Risk Score:   Fall Risk Score:  `1  Depression screen PHQ 2/9  Depression screen [facility] 2/9 12/15/2018 10/14/2018 09/15/2018 07/15/2018 04/20/2018 12/02/2017 09/08/2017   Decreased Interest 0 0 0 0 0 0 0  Down, Depressed, Hopeless 0 0 0 0 0 0 0  PHQ - 2 Score 0 0 0 0 0 0 0  Altered sleeping - - - - - - -  Tired, decreased energy - - - - - - -  Change in appetite - - - - - - -  Feeling bad or failure about yourself  - - - - - - -  Trouble concentrating - - - - - - -  Moving slowly or fidgety/restless - - - - - - -  Suicidal thoughts - - - - - - -  PHQ-9 Score - - - - - - -  Difficult doing work/chores - - - - - - -  Some recent data might be hidden    Review of Systems  Constitutional: Negative.   HENT: Negative.   Eyes: Negative.   Respiratory: Negative.   Cardiovascular: Negative.   Gastrointestinal: Negative.   Endocrine: Negative.   Genitourinary: Negative.   Musculoskeletal: Positive for arthralgias and back pain.  Skin: Negative.   Allergic/Immunologic: Negative.   Neurological: Negative.   Hematological: Negative.   Psychiatric/Behavioral: Negative.   All other systems reviewed and are negative.      Objective:   Physical Exam Vitals signs and nursing note reviewed.  Constitutional:      Appearance: Normal appearance.  Neck:     Musculoskeletal: Normal range of motion and neck supple.  Cardiovascular:     Rate and Rhythm: Normal rate and regular rhythm.  Pulmonary:     Effort: Pulmonary effort is normal.     Breath sounds: Normal breath sounds.  Musculoskeletal:     Comments: Normal Muscle Bulk and Muscle Testing Reveals:  Upper Extremities: Right Full ROM and Muscle Strength 5/5 Left: Decreased ROM 45 Degrees and Muscle Strength 4/5 Lower Extremities: Full ROM and Muscle Strength 5/5 Arises from chair with Ease Narrow Based Gait   Skin:    General: Skin is warm and dry.  Neurological:     Mental Status: She is alert and oriented to person, place, and time.  Psychiatric:        Mood and Affect: Mood normal.        Behavior: Behavior normal.           Assessment &  Plan:  1. Lumbar postlaminectomy syndrome  status post L5-S1 fusion with chronic S1 radiculopathy.Continuecurrent medication regimen withNortriptyline. Refilled :Hydrocodone 10/325 mg #145-one every 6 hours as needed for pain, may take an extra tablet on work days only. 04/25/2019. We will continue the opioid monitoring program, this consists of regular clinic visits, examinations, urine drug screen, pill counts as well as use of [place] Controlled Substance reporting System. 2. 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 Insomnia: Continuecurrent medication regimenAmbien.04/25/2019 4.Chronic Left Shoulder Pain: She is scheduled for Left Shoulder Arthroscopy with Rotator Cuff Repair on 05/18/2019 by Dr. Gunnar Fusi : Ortho Following. 04/25/2019  F/U in 1 month.  15 minutes of face to face patient care time was spent during this visit. All questions were encouraged and answered.

## 2019-04-25 NOTE — Patient Instructions (Signed)
Call [facility] on 05/22/2019: Regarding Surgery

## 2019-04-26 ENCOUNTER — Encounter: Payer: Self-pay | Admitting: Family Medicine

## 2019-04-26 ENCOUNTER — Ambulatory Visit (INDEPENDENT_AMBULATORY_CARE_PROVIDER_SITE_OTHER): Payer: BC Managed Care – PPO | Admitting: Family Medicine

## 2019-04-26 VITALS — BP 122/88 | HR 76 | Ht 63.0 in | Wt 161.0 lb

## 2019-04-26 DIAGNOSIS — F17210 Nicotine dependence, cigarettes, uncomplicated: Secondary | ICD-10-CM | POA: Diagnosis not present

## 2019-04-26 DIAGNOSIS — Z8701 Personal history of pneumonia (recurrent): Secondary | ICD-10-CM

## 2019-04-26 DIAGNOSIS — Z01818 Encounter for other preprocedural examination: Secondary | ICD-10-CM | POA: Diagnosis not present

## 2019-04-26 MED ORDER — NICOTINE 21 MG/24HR TD PT24: 21.0000 mg | MEDICATED_PATCH | Freq: Every day | TRANSDERMAL | 0 refills | Status: DC

## 2019-04-26 NOTE — Progress Notes (Signed)
Date:  04/26/2019   Name:  Elizabeth Torres   DOB:  May 07, 1976   MRN:  [id number]   Chief Complaint: surg clearance (preop for l0 shoulder surgery on Oct 15- no issues or concerns as far as chest pain or SOB)  Patient is a 43 year old female who presents for a presurgical exam. The patient reports the following problems: shoulder pain. Health maintenance has been reviewed suggest pap/influenza.   Review of Systems  Constitutional: Negative.  Negative for chills, fatigue, fever and unexpected weight change.  HENT: Negative for congestion, ear discharge, ear pain, nosebleeds, postnasal drip, rhinorrhea, sinus pressure, sneezing and sore throat.   Eyes: Negative for photophobia, pain, discharge, redness and itching.  Respiratory: Negative for cough, chest tightness, shortness of breath, wheezing and stridor.   Cardiovascular: Negative for chest pain, palpitations and leg swelling.  Gastrointestinal: Negative for abdominal pain, anal bleeding, Torres in stool, constipation, diarrhea, nausea and vomiting.  Endocrine: Negative for cold intolerance, heat intolerance, polydipsia, polyphagia and polyuria.  Genitourinary: Negative for dysuria, flank pain, frequency, hematuria, menstrual problem, pelvic pain, urgency, vaginal bleeding and vaginal discharge.  Musculoskeletal: Negative for arthralgias, back pain, joint swelling and myalgias.  Skin: Negative for color change, pallor and rash.  Allergic/Immunologic: Negative for environmental allergies and food allergies.  Neurological: Negative for dizziness, tremors, seizures, syncope, facial asymmetry, speech difficulty, weakness, light-headedness, numbness and headaches.  Hematological: Negative for adenopathy. Does not bruise/bleed easily.  Psychiatric/Behavioral: Negative for dysphoric mood. The patient is not nervous/anxious.     Patient Active Problem List   Diagnosis Date Noted  . Cervical myofascial pain syndrome 02/14/2016  . History of  breast cancer 11/07/2015  . Postlaminectomy syndrome, lumbar region 04/12/2012  . Chemotherapy-induced neuropathy ([place]) 04/12/2012  . 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 Penicillins Anaphylaxis  . Sulfa Antibiotics Anaphylaxis  . Ondansetron     Other reaction(s): Other (See Comments) migraines  . Zofran [Ondansetron Hcl]     Past Surgical History:  Procedure Laterality Date  . BREAST BIOPSY Right 07/2011   invasive ductal carcinoma  . 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 RAd and chemo tx  . BREAST SURGERY    . mastectomy Right    partial with lymph node dissection  . PORT A CATH REVISION    . PORT-A-CATH REMOVAL  11-07-15   Dr Bary Castilla  . SPINE SURGERY  2008   Dr Joya Salm    Social History   Tobacco Use  . Smoking status: Current Every Day Smoker    Packs/day: 1.00    Types: Cigarettes  . Smokeless tobacco: Never Used  Substance Use Topics  . Alcohol use: Yes    Comment: socially  . Drug use: No     Medication list has been reviewed and updated.  Current Meds  Medication Sig  . HYDROcodone-acetaminophen (NORCO) 10-325 MG tablet TAKE 1 TABLET BY MOUTH EVERY 6 HOURS AS NEEDED FOR PAIN, may take an extra tablet on work days. No more than 5 a day. Do not fill before 03/23/2019  . ibuprofen (ADVIL,MOTRIN) 800 MG tablet Take 800 mg by mouth every 6 (six) hours as needed (pain).   . nortriptyline (PAMELOR) 10 MG capsule TAKE 2 CAPSULES BY MOUTH EVERY DAY AT BEDTIME  . zolpidem (AMBIEN) 5 MG tablet TAKE 1 TABLET BY MOUTH EVERY DAY AT BEDTIME AS NEEDED FOR INSOMNIA  PHQ 2/9 Scores 04/26/2019 12/15/2018 10/14/2018 09/15/2018  PHQ - 2 Score 0 0 0 0  PHQ- 9 Score 0 - - -    BP Readings from Last 3 Encounters:  04/26/19 122/88  04/25/19 (!) 134/94  03/31/19 (!) 149/94    Physical Exam Vitals signs and nursing note reviewed.  Constitutional:       General: She is not in acute distress.    Appearance: She is not diaphoretic.  HENT:     Head: Normocephalic and atraumatic.     Right Ear: Tympanic membrane, ear canal and external ear normal.     Left Ear: Tympanic membrane, ear canal and external ear normal.     Nose: Nose normal. No congestion or rhinorrhea.  Eyes:     General:        Right eye: No discharge.        Left eye: No discharge.     Conjunctiva/sclera: Conjunctivae normal.     Pupils: Pupils are equal, round, and reactive to light.  Neck:     Musculoskeletal: Normal range of motion and neck supple.     Thyroid: No thyromegaly.     Vascular: No JVD.  Cardiovascular:     Rate and Rhythm: Normal rate and regular rhythm.     Chest Wall: PMI is not displaced.     Heart sounds: Normal heart sounds, S1 normal and S2 normal. No murmur. No systolic murmur. No diastolic murmur. No friction rub. No gallop. No S3 or S4 sounds.   Pulmonary:     Effort: Pulmonary effort is normal.     Breath sounds: Normal breath sounds. No stridor. No decreased breath sounds, wheezing, rhonchi or rales.  Abdominal:     General: Bowel sounds are normal.     Palpations: Abdomen is soft. There is no mass.     Tenderness: There is no abdominal tenderness. There is no guarding.  Musculoskeletal: Normal range of motion.     Right lower leg: No edema.     Left lower leg: No edema.  Lymphadenopathy:     Cervical: No cervical adenopathy.  Skin:    General: Skin is warm and dry.  Neurological:     Mental Status: She is alert.     Deep Tendon Reflexes: Reflexes are normal and symmetric.     Wt Readings from Last 3 Encounters:  04/26/19 161 lb (73 kg)  04/25/19 162 lb (73.5 kg)  03/31/19 161 lb (73 kg)    BP 122/88   Pulse 76   Ht 5\' 3"  (1.6 m)   Wt 161 lb (73 kg)   LMP 03/30/2019   BMI 28.52 kg/m   Assessment and Plan:  1. Preoperative clearance No subjective objective concerns noted during physical and history taking.  Review of  previous notes notes that there was a 2015 ER visit for chest pain which ruled out with troponins and a normal EKG that was checked and compared to the subsequent EKG today.  There are no contraindications for surgery.I have reviewed EKG which shows normal rate normal sinus rhythm normal intervals, there is no evidence of ischemic changes such as Q waves, ST segment changes, or T wave changes.  Patient is cleared for surgery from a cardiac concern.. Comparison to previous EKG dated 2016 and notes that there is no change. - EKG 12-Lead  2. Cigarette nicotine dependence without complication Patient has a history of nicotine abuse.Patient has been advised of the health risks of smoking and counseled concerning  cessation of tobacco products. I spent over 3 minutes for discussion and to answer questions.  Patient was given a prescription for NicoDerm patches at 21 mg and encouraged to stop smoking with the upcoming surgery as suggested by her surgeon.  3. History of pneumonia patient had a multifocal pneumonia that was in 2019 upon review of chest x-ray at that time.  We will send patient for chest x-ray to evaluate if there is any residual concerns from her previous pneumonia.

## 2019-04-27 ENCOUNTER — Ambulatory Visit
Admission: RE | Admit: 2019-04-27 | Discharge: 2019-04-27 | Disposition: A | Discharge status: Home / Self Care | Payer: BC Managed Care – PPO | Source: Ambulatory Visit | Attending: Family Medicine | Admitting: Family Medicine

## 2019-04-27 ENCOUNTER — Other Ambulatory Visit: Payer: Self-pay

## 2019-04-27 ENCOUNTER — Ambulatory Visit
Admission: RE | Admit: 2019-04-27 | Discharge: 2019-04-27 | Disposition: A | Discharge status: Home / Self Care | Payer: BC Managed Care – PPO | Attending: Family Medicine | Admitting: Family Medicine

## 2019-04-27 DIAGNOSIS — Z8701 Personal history of pneumonia (recurrent): Secondary | ICD-10-CM

## 2019-04-27 DIAGNOSIS — Z01818 Encounter for other preprocedural examination: Secondary | ICD-10-CM

## 2019-04-28 ENCOUNTER — Other Ambulatory Visit: Payer: Self-pay | Admitting: Orthopedic Surgery

## 2019-05-09 DIAGNOSIS — Z01818 Encounter for other preprocedural examination: Secondary | ICD-10-CM | POA: Diagnosis not present

## 2019-05-09 DIAGNOSIS — M7542 Impingement syndrome of left shoulder: Secondary | ICD-10-CM | POA: Diagnosis not present

## 2019-05-11 ENCOUNTER — Encounter: Payer: Self-pay | Admitting: *Deleted

## 2019-05-11 ENCOUNTER — Other Ambulatory Visit: Payer: Self-pay

## 2019-05-11 NOTE — Anesthesia Preprocedure Evaluation (Addendum)
Anesthesia Evaluation  Patient identified by MRN, date of birth, ID band Patient awake    Reviewed: Allergy & Precautions, NPO status , Patient's Chart, lab work & pertinent test results  History of Anesthesia Complications Negative for: history of anesthetic complications  Airway Mallampati: III  TM Distance: >3 FB Neck ROM: Full    Dental   Pulmonary Current Smoker (1 PPD, smoked this AM)Patient did not abstain from smoking.,    breath sounds clear to auscultation       Cardiovascular (-) angina(-) DOE  Rhythm:Regular Rate:Normal     Neuro/Psych  Neuromuscular disease (Chemo-induced neuropathy)    GI/Hepatic neg GERD  ,  Endo/Other    Renal/GU      Musculoskeletal   Abdominal   Peds  Hematology   Anesthesia Other Findings Breast cancer s/p chemo & radiation  Reproductive/Obstetrics                            Anesthesia Physical Anesthesia Plan  ASA: II  Anesthesia Plan: General   Post-op Pain Management:  Regional for Post-op pain   Induction: Intravenous  PONV Risk Score and Plan: 2 and Ondansetron, Dexamethasone, Midazolam and Treatment may vary due to age or medical condition  Airway Management Planned: Oral ETT  Additional Equipment:   Intra-op Plan:   Post-operative Plan: Extubation in OR  Informed Consent: I have reviewed the patients History and Physical, chart, labs and discussed the procedure including the risks, benefits and alternatives for the proposed anesthesia with the patient or authorized representative who has indicated his/her understanding and acceptance.       Plan Discussed with: CRNA and Anesthesiologist  Anesthesia Plan Comments:         Anesthesia Quick Evaluation

## 2019-05-15 ENCOUNTER — Other Ambulatory Visit: Payer: Self-pay

## 2019-05-15 ENCOUNTER — Other Ambulatory Visit
Admission: RE | Admit: 2019-05-15 | Discharge: 2019-05-15 | Disposition: A | Discharge status: Home / Self Care | Payer: BC Managed Care – PPO | Source: Ambulatory Visit | Attending: Orthopedic Surgery | Admitting: Orthopedic Surgery

## 2019-05-15 ENCOUNTER — Other Ambulatory Visit: Admission: RE | Admit: 2019-05-15 | Payer: BC Managed Care – PPO | Source: Ambulatory Visit

## 2019-05-15 ENCOUNTER — Other Ambulatory Visit: Payer: Self-pay | Admitting: Registered Nurse

## 2019-05-15 DIAGNOSIS — Z01812 Encounter for preprocedural laboratory examination: Secondary | ICD-10-CM | POA: Insufficient documentation

## 2019-05-15 DIAGNOSIS — Z20828 Contact with and (suspected) exposure to other viral communicable diseases: Secondary | ICD-10-CM | POA: Insufficient documentation

## 2019-05-15 LAB — SARS CORONAVIRUS 2 (TAT 6-24 HRS): SARS Coronavirus 2: NEGATIVE

## 2019-05-18 ENCOUNTER — Other Ambulatory Visit: Payer: Self-pay

## 2019-05-18 ENCOUNTER — Ambulatory Visit: Payer: BC Managed Care – PPO | Admitting: Anesthesiology

## 2019-05-18 ENCOUNTER — Ambulatory Visit
Admission: RE | Admit: 2019-05-18 | Discharge: 2019-05-18 | Disposition: A | Discharge status: Home / Self Care | Payer: BC Managed Care – PPO | Attending: Orthopedic Surgery | Admitting: Orthopedic Surgery

## 2019-05-18 ENCOUNTER — Telehealth: Payer: Self-pay | Admitting: *Deleted

## 2019-05-18 ENCOUNTER — Encounter
Admission: RE | Disposition: A | Discharge status: Home / Self Care | Payer: Self-pay | Source: Home / Self Care | Attending: Orthopedic Surgery

## 2019-05-18 DIAGNOSIS — Z9221 Personal history of antineoplastic chemotherapy: Secondary | ICD-10-CM | POA: Insufficient documentation

## 2019-05-18 DIAGNOSIS — M7522 Bicipital tendinitis, left shoulder: Secondary | ICD-10-CM | POA: Insufficient documentation

## 2019-05-18 DIAGNOSIS — F17210 Nicotine dependence, cigarettes, uncomplicated: Secondary | ICD-10-CM | POA: Diagnosis not present

## 2019-05-18 DIAGNOSIS — Z853 Personal history of malignant neoplasm of breast: Secondary | ICD-10-CM | POA: Diagnosis not present

## 2019-05-18 DIAGNOSIS — X58XXXA Exposure to other specified factors, initial encounter: Secondary | ICD-10-CM | POA: Insufficient documentation

## 2019-05-18 DIAGNOSIS — G4700 Insomnia, unspecified: Secondary | ICD-10-CM | POA: Insufficient documentation

## 2019-05-18 DIAGNOSIS — M7552 Bursitis of left shoulder: Secondary | ICD-10-CM | POA: Diagnosis not present

## 2019-05-18 DIAGNOSIS — G620 Drug-induced polyneuropathy: Secondary | ICD-10-CM | POA: Diagnosis not present

## 2019-05-18 DIAGNOSIS — S43432A Superior glenoid labrum lesion of left shoulder, initial encounter: Secondary | ICD-10-CM | POA: Diagnosis not present

## 2019-05-18 DIAGNOSIS — T451X5A Adverse effect of antineoplastic and immunosuppressive drugs, initial encounter: Secondary | ICD-10-CM | POA: Insufficient documentation

## 2019-05-18 DIAGNOSIS — M7542 Impingement syndrome of left shoulder: Secondary | ICD-10-CM | POA: Insufficient documentation

## 2019-05-18 DIAGNOSIS — M25512 Pain in left shoulder: Secondary | ICD-10-CM | POA: Diagnosis not present

## 2019-05-18 DIAGNOSIS — Z923 Personal history of irradiation: Secondary | ICD-10-CM | POA: Diagnosis not present

## 2019-05-18 DIAGNOSIS — M13812 Other specified arthritis, left shoulder: Secondary | ICD-10-CM | POA: Diagnosis not present

## 2019-05-18 DIAGNOSIS — G8918 Other acute postprocedural pain: Secondary | ICD-10-CM | POA: Diagnosis not present

## 2019-05-18 DIAGNOSIS — M19012 Primary osteoarthritis, left shoulder: Secondary | ICD-10-CM | POA: Diagnosis not present

## 2019-05-18 HISTORY — DX: Other intervertebral disc degeneration, lumbar region without mention of lumbar back pain or lower extremity pain: M51.369

## 2019-05-18 HISTORY — PX: SHOULDER ARTHROSCOPY WITH ROTATOR CUFF REPAIR: SHX5685

## 2019-05-18 HISTORY — DX: Other intervertebral disc degeneration, lumbar region: M51.36

## 2019-05-18 LAB — POCT PREGNANCY, URINE: Preg Test, Ur: NEGATIVE

## 2019-05-18 SURGERY — ARTHROSCOPY, SHOULDER, WITH ROTATOR CUFF REPAIR
Anesthesia: General | Site: Shoulder | Laterality: Left

## 2019-05-18 MED ORDER — MIDAZOLAM HCL 5 MG/5ML IJ SOLN
INTRAMUSCULAR | Status: DC | PRN
  Administered 2019-05-18: 2 mg via INTRAVENOUS

## 2019-05-18 MED ORDER — EPINEPHRINE 30 MG/30ML IJ SOLN
INTRAMUSCULAR | Status: DC | PRN
  Administered 2019-05-18: 4 mL

## 2019-05-18 MED ORDER — GLYCOPYRROLATE 0.2 MG/ML IJ SOLN
INTRAMUSCULAR | Status: DC | PRN
  Administered 2019-05-18: 0.1 mg via INTRAVENOUS

## 2019-05-18 MED ORDER — FENTANYL CITRATE (PF) 100 MCG/2ML IJ SOLN
INTRAMUSCULAR | Status: DC | PRN
  Administered 2019-05-18: 100 ug via INTRAVENOUS

## 2019-05-18 MED ORDER — LIDOCAINE HCL (CARDIAC) PF 100 MG/5ML IV SOSY
PREFILLED_SYRINGE | INTRAVENOUS | Status: DC | PRN
  Administered 2019-05-18: 50 mg via INTRATRACHEAL

## 2019-05-18 MED ORDER — OXYCODONE HCL 5 MG/5ML PO SOLN: 5.0000 mg | Freq: Once | ORAL | Status: DC | PRN

## 2019-05-18 MED ORDER — HYDROMORPHONE HCL 1 MG/ML IJ SOLN: 0.2500 mg | INTRAMUSCULAR | Status: DC | PRN

## 2019-05-18 MED ORDER — LACTATED RINGERS IV SOLN
100.0000 mL/h | INTRAVENOUS | Status: DC
  Administered 2019-05-18: 14:00:00 via INTRAVENOUS

## 2019-05-18 MED ORDER — ACETAMINOPHEN 500 MG PO TABS
1000.0000 mg | ORAL_TABLET | Freq: Once | ORAL | Status: AC
  Administered 2019-05-18: 1000 mg via ORAL

## 2019-05-18 MED ORDER — MEPERIDINE HCL 25 MG/ML IJ SOLN: 6.2500 mg | INTRAMUSCULAR | Status: DC | PRN

## 2019-05-18 MED ORDER — PROMETHAZINE HCL 25 MG/ML IJ SOLN
6.2500 mg | INTRAMUSCULAR | Status: DC | PRN
  Administered 2019-05-18: 15:00:00 12.5 mg via INTRAVENOUS

## 2019-05-18 MED ORDER — OXYCODONE-ACETAMINOPHEN 10-325 MG PO TABS: 1.0000 | ORAL_TABLET | Freq: Four times a day (QID) | ORAL | 0 refills | Status: DC | PRN

## 2019-05-18 MED ORDER — CHLORHEXIDINE GLUCONATE 4 % EX LIQD: 60.0000 mL | Freq: Once | CUTANEOUS | Status: DC

## 2019-05-18 MED ORDER — LACTATED RINGERS IV SOLN: INTRAVENOUS | Status: DC

## 2019-05-18 MED ORDER — EPHEDRINE SULFATE 50 MG/ML IJ SOLN
INTRAMUSCULAR | Status: DC | PRN
  Administered 2019-05-18 (×2): 5 mg via INTRAVENOUS
  Administered 2019-05-18: 10 mg via INTRAVENOUS

## 2019-05-18 MED ORDER — BUPIVACAINE HCL (PF) 0.5 % IJ SOLN
INTRAMUSCULAR | Status: DC | PRN
  Administered 2019-05-18: 20 mL

## 2019-05-18 MED ORDER — OXYCODONE HCL 5 MG PO TABS: 5.0000 mg | ORAL_TABLET | Freq: Once | ORAL | Status: DC | PRN

## 2019-05-18 MED ORDER — CLINDAMYCIN PHOSPHATE 900 MG/50ML IV SOLN
900.0000 mg | INTRAVENOUS | Status: AC
  Administered 2019-05-18: 900 mg via INTRAVENOUS

## 2019-05-18 MED ORDER — BUPIVACAINE LIPOSOME 1.3 % IJ SUSP
INTRAMUSCULAR | Status: DC | PRN
  Administered 2019-05-18: 20 mL

## 2019-05-18 MED ORDER — DEXAMETHASONE SODIUM PHOSPHATE 4 MG/ML IJ SOLN
INTRAMUSCULAR | Status: DC | PRN
  Administered 2019-05-18: 4 mg via INTRAVENOUS

## 2019-05-18 SURGICAL SUPPLY — 45 items
ADAPTER IRRIG TUBE 2 SPIKE SOL (ADAPTER) ×4 IMPLANT
BLADE FULL RADIUS 3.5 (BLADE) ×2 IMPLANT
BLADE INCISOR PLUS 4.5 (BLADE) ×2 IMPLANT
BUR ACROMIONIZER 4.0 (BURR) ×1 IMPLANT
BUR BR 5.5 WIDE MOUTH (BURR) ×1 IMPLANT
CANNULA SHOULDER 7CM (CANNULA) ×1 IMPLANT
CHLORAPREP W/TINT 26 (MISCELLANEOUS) ×2 IMPLANT
COOLER POLAR GLACIER W/PUMP (MISCELLANEOUS) ×2 IMPLANT
COVER WAND RF STERILE (DRAPES) ×2 IMPLANT
DRAPE IMP U-DRAPE 54X76 (DRAPES) ×4 IMPLANT
DRAPE SHEET LG 3/4 BI-LAMINATE (DRAPES) ×2 IMPLANT
DRAPE STERI 35X30 U-POUCH (DRAPES) ×2 IMPLANT
GAUZE SPONGE 4X4 12PLY STRL (GAUZE/BANDAGES/DRESSINGS) ×2 IMPLANT
GAUZE XEROFORM 1X8 LF (GAUZE/BANDAGES/DRESSINGS) ×2 IMPLANT
GLOVE BIOGEL PI IND STRL 8 (GLOVE) ×1 IMPLANT
GLOVE BIOGEL PI INDICATOR 8 (GLOVE) ×3
GLOVE SURG ORTHO 8.0 STRL STRW (GLOVE) ×4 IMPLANT
GOWN STRL REUS W/ TWL LRG LVL3 (GOWN DISPOSABLE) ×1 IMPLANT
GOWN STRL REUS W/ TWL XL LVL3 (GOWN DISPOSABLE) ×1 IMPLANT
GOWN STRL REUS W/TWL LRG LVL3 (GOWN DISPOSABLE) ×2
GOWN STRL REUS W/TWL XL LVL3 (GOWN DISPOSABLE) ×1
IV LACTATED RINGER IRRG 3000ML (IV SOLUTION) ×4
IV LR IRRIG 3000ML ARTHROMATIC (IV SOLUTION) ×6 IMPLANT
KIT STABILIZATION SHOULDER (MISCELLANEOUS) ×2 IMPLANT
KIT TURNOVER KIT A (KITS) ×2 IMPLANT
MANIFOLD NEPTUNE II (INSTRUMENTS) ×3 IMPLANT
MASK FACE SPIDER DISP (MASK) ×2 IMPLANT
MAT ABSORB  FLUID 56X50 GRAY (MISCELLANEOUS) ×2
MAT ABSORB FLUID 56X50 GRAY (MISCELLANEOUS) ×1 IMPLANT
NDL SCORPION MULTI FIRE (NEEDLE) IMPLANT
NDL SPNL 18GX3.5 QUINCKE PK (NEEDLE) ×1 IMPLANT
NEEDLE SCORPION MULTI FIRE (NEEDLE) IMPLANT
NEEDLE SPNL 18GX3.5 QUINCKE PK (NEEDLE) ×2 IMPLANT
PACK ARTHROSCOPY SHOULDER (MISCELLANEOUS) ×2 IMPLANT
PAD ABD DERMACEA PRESS 5X9 (GAUZE/BANDAGES/DRESSINGS) IMPLANT
SLING ARM LRG DEEP (SOFTGOODS) IMPLANT
SLING ARM M TX990204 (SOFTGOODS) ×1 IMPLANT
SUT FIBERWIRE #2 38 T-5 BLUE (SUTURE)
SUT TIGER TAPE 7 IN WHITE (SUTURE) IMPLANT
SUTURE FIBERWR #2 38 T-5 BLUE (SUTURE) IMPLANT
SYR 5ML LL (SYRINGE) ×1 IMPLANT
TAPE MICROFOAM 4IN (TAPE) ×2 IMPLANT
TUBING ARTHRO INFLOW-ONLY STRL (TUBING) ×2 IMPLANT
TUBING CONNECTING 10 (TUBING) ×2 IMPLANT
WAND WEREWOLF FLOW 90D (MISCELLANEOUS) ×2 IMPLANT

## 2019-05-18 NOTE — Discharge Instructions (Signed)
Wear sling at all times, including sleep.  You will need to use the sling for a total of 4 weeks following surgery.  Do not try and lift your arm up or away from your body for any reason.   Keep the dressing dry.  You may remove bandage in 3 days.  You may place Band-Aids over top of the incisions.  May shower once dressing is removed in 3 days.  Remove sling carefully only for showers, leaving arm down by your side while in the shower.  +++ Make sure to take some pain medication this evening before you fall asleep, in preparation for the nerve block wearing off in the middle of the night.  If the the pain medication causes itching, or is too strong, try taking a single tablet at a time, or combining with Benadryl.  You may be most comfortable sleeping in a recliner.  If you do sleep in near bed, placed pillows behind the shoulder that have the operation to support it.

## 2019-05-18 NOTE — Anesthesia Procedure Notes (Signed)
Anesthesia Regional Block: Interscalene brachial plexus block   Pre-Anesthetic Checklist: ,, timeout performed, Correct Patient, Correct Site, Correct Laterality, Correct Procedure, Correct Position, site marked, Risks and benefits discussed,  Surgical consent,  Pre-op evaluation,  At surgeon's request and post-op pain management  Laterality: Left  Prep: chloraprep       Needles:  Injection technique: Single-shot  Needle Type: Stimiplex     Needle Length: 10cm  Needle Gauge: 21     Additional Needles:   Procedures:,,,, ultrasound used (permanent image in chart),,,,  Narrative:  Injection made incrementally with aspirations every 5 mL.  Performed by: Personally   Additional Notes: Functioning IV was confirmed and monitors applied. Ultrasound guidance: relevant anatomy identified, needle position confirmed, local anesthetic spread visualized around nerve(s)., vascular puncture avoided.  Image printed for medical record.  Negative aspiration and no paresthesias; incremental administration of local anesthetic. The patient tolerated the procedure well. Vitals signes recorded in RN notes.      

## 2019-05-18 NOTE — Telephone Encounter (Signed)
Dr. Kurtis Bushman, [facility], left a message stating he has recently performed surgery on our patient.  He is changing her post op medication to percocet 10-325mg .  If there is any problems he encourages direct call back to his cellphone [phone]

## 2019-05-18 NOTE — Anesthesia Procedure Notes (Signed)
Procedure Name: LMA Insertion Date/Time: 05/18/2019 1:46 PM Performed by: Mayme Genta, CRNA Pre-anesthesia Checklist: Patient identified, Emergency Drugs available, Suction available, Timeout performed and Patient being monitored Patient Re-evaluated:Patient Re-evaluated prior to induction Oxygen Delivery Method: Circle system utilized Preoxygenation: Pre-oxygenation with 100% oxygen Induction Type: IV induction LMA: LMA inserted LMA Size: 4.0 Number of attempts: 1 Placement Confirmation: positive ETCO2 and breath sounds checked- equal and bilateral Tube secured with: Tape

## 2019-05-18 NOTE — Transfer of Care (Signed)
Immediate Anesthesia Transfer of Care Note  Patient: Elizabeth Torres  Procedure(s) Performed: SHOULDER ARTHROSCOPY WITH SUBACROMIAL DECOMPRESSION AND DISTAL CLAVICAL EXCISION, INTRAARTICULAR DEBRIDEMENT (Left Shoulder)  Patient Location: PACU  Anesthesia Type: General  Level of Consciousness: awake, alert  and patient cooperative  Airway and Oxygen Therapy: Patient Spontanous Breathing and Patient connected to supplemental oxygen  Post-op Assessment: Post-op Vital signs reviewed, Patient's Cardiovascular Status Stable, Respiratory Function Stable, Patent Airway and No signs of Nausea or vomiting  Post-op Vital Signs: Reviewed and stable  Complications: No apparent anesthesia complications

## 2019-05-18 NOTE — Op Note (Signed)
05/18/2019  3:26 PM  PATIENT:  Elizabeth Torres    PRE-OPERATIVE DIAGNOSIS:  M75.42 Impingement syndrome of left shoulder  POST-OPERATIVE DIAGNOSIS:  Same with AC arthritis, superior labral tear, biceps tendinitis  PROCEDURE:  LEFT SHOULDER ARTHROSCOPY WITH SUBACROMIAL DECOMPRESSION AND DISTAL CLAVICAL EXCISION, INTRAARTICULAR DEBRIDEMENT  SURGEON:  Lovell Sheehan, MD  ANESTHESIA:   General  PREOPERATIVE INDICATIONS:  Elizabeth Torres is a  43 y.o. 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    I discussed the risks and benefits of surgery. The risks include but are not limited to infection, bleeding requiring blood transfusion, nerve or blood vessel injury, joint stiffness or loss of motion, persistent pain, weakness or instability, malunion, nonunion and hardware failure and the need for further surgery. Medical risks include but are not limited to DVT and pulmonary embolism, myocardial infarction, stroke, pneumonia, respiratory failure and death. Patient understood these risks and wished to proceed.   OPERATIVE FINDINGS: The biceps tendon origin had extensive synovitis and degenerative fraying. There was degenerative tearing of the superior labrum both anterior and posterior. There was no evidence of high-grade partial or full-thickness tear of the rotator cuff.  The subscapularis was intact. No loose bodies within the inferior recess. The humeral head and glenoid had minimal degenerative changes. Patient had a severe subacromial bursitis and advanced acromioclavicular joint arthrosis.  OPERATIVE PROCEDURE: The patient was met in the preoperative area. The left shoulder was signed with the word yes and my initials according the hospital's correct site of surgery protocol.  History and physical was updated.  Patient was brought to the operating room where he underwent interscalene block and general anesthesia. The patient was placed in a beachchair position.  A spider arm positioner was used for this case. Examination under anesthesia revealed no limitation of motion or instability with load shift testing. The patient had a negative sulcus sign.  Patient was prepped and draped in a sterile fashion. A timeout was performed to verify the patient's name, date of birth, medical record number, correct site of surgery and correct procedure to be performed there was also used to verify the patient received antibiotics that all appropriate instruments, implants and radiographs studies were available in the room. Once all in attendance were in agreement case began.  Bony landmarks were drawn out with a surgical marker along with proposed arthroscopy incisions. These were pre-injected with 1% lidocaine plain. An 11 blade was used to establish a posterior portal through which the arthroscope was placed in the glenohumeral joint. A full diagnostic examination of the shoulder was performed. Please see findings for a complete report. A biceps tenotomy was then performed with electrocautery and the synovitis, degenerative labrum and biceps stump were debrided with the shaver.  The arthroscope was then placed in the subacromial space.  Extensive bursitis was encountered. A lateral portal was established with an 18-gauge spinal needle for localization. A 90 ArthroCare wand and shaver blade were used to form an extensive subdeltoid and subacromial bursectomy. There was no evidence of a bursal sided tear of the supra or infraspinatus.   A subacromial decompression was performed using a 4.0 mm Acromionizer from the lateral portal. The 4.0 mm Acromionizer was then placed through the anterior portal.  A distal clavicle excision was performed. Subacromial space was then copiously irrigated to remove all osseous debris.  Final  arthroscopic images were taken. Arthroscopic images were then removed.  Skin closure for the arthroscopic incisions was performed with 4-0 nylon.   0.25% Marcaine plain was then injected into the subacromial space for postoperative pain control. A dry sterile dressing was applied.  The patient was placed in a sling.  All sharp and it instrument counts were correct at the conclusion of the case. I was scrubbed and present for the entire case. I spoke with the patient's family postoperatively to let them know the case had been performed without complication and the patient was stable in recovery room.

## 2019-05-18 NOTE — H&P (Signed)
The patient has been re-examined, and the chart reviewed, and there have been no interval changes to the documented history and physical.  Plan a left shoulder scope today.  Anesthesia is consulted regarding a peripheral nerve block for post-operative pain.  The risks, benefits, and alternatives have been discussed at length, and the patient is willing to proceed.    

## 2019-05-18 NOTE — Anesthesia Postprocedure Evaluation (Signed)
Anesthesia Post Note  Patient: EVALYN EIGSTI  Procedure(s) Performed: SHOULDER ARTHROSCOPY WITH SUBACROMIAL DECOMPRESSION AND DISTAL CLAVICAL EXCISION, INTRAARTICULAR DEBRIDEMENT (Left Shoulder)  Patient location during evaluation: PACU Anesthesia Type: General Level of consciousness: awake and alert Pain management: pain level controlled Vital Signs Assessment: post-procedure vital signs reviewed and stable Respiratory status: spontaneous breathing, nonlabored ventilation, respiratory function stable and patient connected to nasal cannula oxygen Cardiovascular status: blood pressure returned to baseline and stable Postop Assessment: no apparent nausea or vomiting Anesthetic complications: no    Demitris Pokorny A  Roderic Lammert

## 2019-05-19 ENCOUNTER — Encounter: Payer: Self-pay | Admitting: Orthopedic Surgery

## 2019-05-22 ENCOUNTER — Telehealth: Payer: Self-pay

## 2019-05-22 NOTE — Telephone Encounter (Signed)
Patient called stating she was suppose to come in today for an appt but can't make it due to shoulder surgery on Thursday and was told to call if not going to make appt.

## 2019-05-22 NOTE — Telephone Encounter (Signed)
Returned Elizabeth Torres call, she has an appointment with Dr. Harlow Mares on 05/26/2019, on 05/18/2019 she underwent by Dr. Harlow Mares SHOULDER ARTHROSCOPY WITH SUBACROMIAL DECOMPRESSION AND DISTAL CLAVICAL EXCISION, INTRAARTICULAR DEBRIDEMENT Left  She was prescribed oxycodone by Dr. Harlow Mares, she reports the Oxycodone is making her drowsy and offers no relief in her pain. She has resumed her Hydrocodone. Her schedule F/U appointment was changed to 06/02/2019, she has an appointment with Dr. Harlow Mares on 05/26/2019, she was instructed to call office on Friday 05/26/2019, she verbalizes understanding.

## 2019-05-26 ENCOUNTER — Encounter: Payer: BC Managed Care – PPO | Admitting: Registered Nurse

## 2019-05-26 DIAGNOSIS — M25612 Stiffness of left shoulder, not elsewhere classified: Secondary | ICD-10-CM | POA: Diagnosis not present

## 2019-05-26 DIAGNOSIS — M25512 Pain in left shoulder: Secondary | ICD-10-CM | POA: Diagnosis not present

## 2019-05-30 DIAGNOSIS — M25512 Pain in left shoulder: Secondary | ICD-10-CM | POA: Diagnosis not present

## 2019-05-30 DIAGNOSIS — M25612 Stiffness of left shoulder, not elsewhere classified: Secondary | ICD-10-CM | POA: Diagnosis not present

## 2019-06-02 ENCOUNTER — Telehealth: Payer: Self-pay | Admitting: *Deleted

## 2019-06-02 ENCOUNTER — Encounter: Payer: BC Managed Care – PPO | Admitting: Registered Nurse

## 2019-06-02 MED ORDER — HYDROCODONE-ACETAMINOPHEN 10-325 MG PO TABS: 1.0000 | ORAL_TABLET | Freq: Four times a day (QID) | ORAL | 0 refills | Status: DC | PRN

## 2019-06-02 NOTE — Telephone Encounter (Signed)
[name] was to have an appointment with [name] today to resume her pre surgery pain medication.  [name] is out of the office today and [name] will be out of the # 20 oxycodone 10/325 they gave her after surgery on 05/18/19. She wishes to go back to her hydrocodone 10/325.  I have filled out the Rx for you to sign.

## 2019-06-02 NOTE — Telephone Encounter (Signed)
[name] notified, and she is making an appt to see Elizabeth Torres in November.

## 2019-06-16 ENCOUNTER — Telehealth: Payer: Self-pay | Admitting: Family Medicine

## 2019-06-16 NOTE — Telephone Encounter (Signed)
Informed patient dr Ronnald Ramp do not test for Covid and do not see anyone in office for sick visits, she was advised to go to [facility] for her symptoms and possible testing.

## 2019-06-16 NOTE — Telephone Encounter (Signed)
Patient have been around someone that was confirmed of Covid, she is having symptoms of  Vomiting, headaches,cough, and some chest tightness.

## 2019-06-21 ENCOUNTER — Encounter: Payer: BC Managed Care – PPO | Attending: Physical Medicine and Rehabilitation | Admitting: Registered Nurse

## 2019-06-21 ENCOUNTER — Other Ambulatory Visit: Payer: Self-pay

## 2019-06-21 ENCOUNTER — Encounter: Payer: Self-pay | Admitting: Registered Nurse

## 2019-06-21 VITALS — BP 130/87 | HR 101 | Temp 97.9°F | Ht 63.0 in | Wt 165.0 lb

## 2019-06-21 DIAGNOSIS — G8929 Other chronic pain: Secondary | ICD-10-CM | POA: Diagnosis not present

## 2019-06-21 DIAGNOSIS — F17210 Nicotine dependence, cigarettes, uncomplicated: Secondary | ICD-10-CM | POA: Diagnosis not present

## 2019-06-21 DIAGNOSIS — M545 Low back pain: Secondary | ICD-10-CM | POA: Insufficient documentation

## 2019-06-21 DIAGNOSIS — M79604 Pain in right leg: Secondary | ICD-10-CM | POA: Diagnosis not present

## 2019-06-21 DIAGNOSIS — M79605 Pain in left leg: Secondary | ICD-10-CM | POA: Insufficient documentation

## 2019-06-21 DIAGNOSIS — G620 Drug-induced polyneuropathy: Secondary | ICD-10-CM

## 2019-06-21 DIAGNOSIS — G894 Chronic pain syndrome: Secondary | ICD-10-CM | POA: Diagnosis not present

## 2019-06-21 DIAGNOSIS — M961 Postlaminectomy syndrome, not elsewhere classified: Secondary | ICD-10-CM

## 2019-06-21 DIAGNOSIS — Z760 Encounter for issue of repeat prescription: Secondary | ICD-10-CM | POA: Insufficient documentation

## 2019-06-21 DIAGNOSIS — Z9221 Personal history of antineoplastic chemotherapy: Secondary | ICD-10-CM | POA: Insufficient documentation

## 2019-06-21 DIAGNOSIS — Z5181 Encounter for therapeutic drug level monitoring: Secondary | ICD-10-CM

## 2019-06-21 DIAGNOSIS — T451X5S Adverse effect of antineoplastic and immunosuppressive drugs, sequela: Secondary | ICD-10-CM | POA: Diagnosis not present

## 2019-06-21 DIAGNOSIS — Z79891 Long term (current) use of opiate analgesic: Secondary | ICD-10-CM

## 2019-06-21 DIAGNOSIS — T451X5A Adverse effect of antineoplastic and immunosuppressive drugs, initial encounter: Secondary | ICD-10-CM

## 2019-06-21 DIAGNOSIS — M5416 Radiculopathy, lumbar region: Secondary | ICD-10-CM | POA: Diagnosis not present

## 2019-06-21 MED ORDER — ZOLPIDEM TARTRATE 5 MG PO TABS: ORAL_TABLET | ORAL | 3 refills | Status: DC

## 2019-06-21 MED ORDER — HYDROCODONE-ACETAMINOPHEN 10-325 MG PO TABS: 1.0000 | ORAL_TABLET | Freq: Four times a day (QID) | ORAL | 0 refills | Status: DC | PRN

## 2019-06-21 NOTE — Progress Notes (Signed)
Subjective:    Patient ID: Elizabeth Torres, female    DOB: 09/27/1975, 43 y.o.   MRN: [id number]  HPI: Elizabeth Torres is a 43 y.o. female who returns for follow up appointment for chronic pain and medication refill. She states her  pain is located in her lower back radiating into her bilateral lower extremities. She rates her pain 4. Her current exercise regime is walking and performing stretching exercises.  Ms. Amick Morphine equivalent is 50.00 MME.  Last Oral Swab was Performed on 03/31/2019, it was consistent.   Pain Inventory Average Pain 4 Pain Right Now 4 My pain is constant, dull and aching  In the last 24 hours, has pain interfered with the following? General activity 0 Relation with others 0 Enjoyment of life 0 What TIME of day is your pain at its worst? night Sleep (in general) Fair  Pain is worse with: sitting, inactivity and standing Pain improves with: rest and medication Relief from Meds: 5  Mobility Do you have any goals in this area?  no  Function Do you have any goals in this area?  no  Neuro/Psych No problems in this area  Prior Studies Any changes since last visit?  no  Physicians involved in your care Any changes since last visit?  no   Family History  Problem Relation Age of Onset  . Diabetes Mother   . Hypertension Mother   . Cancer Paternal Uncle   . Breast cancer Paternal Uncle        40'S  . Breast cancer Paternal Aunt        38'S   Social History   Socioeconomic History  . Marital status: Married    Spouse name: Not on file  . Number of children: Not on file  . Years of education: Not on file  . Highest education level: Not on file  Occupational History  . Not on file  Social Needs  . Financial resource strain: Not on file  . Food insecurity    Worry: Not on file    Inability: Not on file  . Transportation needs    Medical: Not on file    Non-medical: Not on file  Tobacco Use  . Smoking status: Current Every Day Smoker   Packs/day: 1.00    Years: 20.00    Pack years: 20.00    Types: Cigarettes  . Smokeless tobacco: Never Used  Substance and Sexual Activity  . Alcohol use: Yes    Comment: socially  . Drug use: No  . Sexual activity: Not on file  Lifestyle  . Physical activity    Days per week: Not on file    Minutes per session: Not on file  . Stress: Not on file  Relationships  . Social Herbalist on phone: Not on file    Gets together: Not on file    Attends religious service: Not on file    Active member of club or organization: Not on file    Attends meetings of clubs or organizations: Not on file    Relationship status: Not on file  Other Topics Concern  . Not on file  Social History Narrative  . Not on file   Past Surgical History:  Procedure Laterality Date  . BREAST BIOPSY Right 07/2011   invasive ductal carcinoma  . 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 RAd and chemo tx  . BREAST SURGERY    .  mastectomy Right    partial with lymph node dissection  . PORT A CATH REVISION    . PORT-A-CATH REMOVAL  11-07-15   Dr Bary Castilla  . SHOULDER ARTHROSCOPY WITH ROTATOR CUFF REPAIR Left 05/18/2019   Procedure: SHOULDER ARTHROSCOPY WITH SUBACROMIAL DECOMPRESSION AND DISTAL CLAVICAL EXCISION, INTRAARTICULAR DEBRIDEMENT;  Surgeon: Lovell Sheehan, MD;  Location: [place];  Service: Orthopedics;  Laterality: Left;  . SPINE SURGERY  2008   Dr Joya Salm  . TUBAL LIGATION     Past Medical History:  Diagnosis Date  . BRCA negative   . Breast cancer ([place]) 2013   RT LUMPECTOMY with chemo and rad tx  . Degenerative disc disease, lumbar   . Personal history of chemotherapy 2013   for breast ca  . Personal history of radiation therapy 2013   F/U right breast cancer   . Radiation 2013   BREAST CA  . Status post chemotherapy 2013   BREAST CA   There were no vitals taken for this visit.  Opioid Risk Score:   Fall Risk Score:  `1   Depression screen PHQ 2/9  Depression screen [facility] 2/9 04/26/2019 12/15/2018 10/14/2018 09/15/2018 07/15/2018 04/20/2018 12/02/2017  Decreased Interest 0 0 0 0 0 0 0  Down, Depressed, Hopeless 0 0 0 0 0 0 0  PHQ - 2 Score 0 0 0 0 0 0 0  Altered sleeping 0 - - - - - -  Tired, decreased energy 0 - - - - - -  Change in appetite 0 - - - - - -  Feeling bad or failure about yourself  0 - - - - - -  Trouble concentrating 0 - - - - - -  Moving slowly or fidgety/restless 0 - - - - - -  Suicidal thoughts 0 - - - - - -  PHQ-9 Score 0 - - - - - -  Difficult doing work/chores - - - - - - -  Some recent data might be hidden     Review of Systems  Constitutional: Negative.   HENT: Negative.   Eyes: Negative.   Respiratory: Negative.   Cardiovascular: Negative.   Gastrointestinal: Negative.   Endocrine: Negative.   Genitourinary: Negative.   Musculoskeletal: Positive for arthralgias and back pain.  Skin: Negative.   Allergic/Immunologic: Negative.   Neurological: Negative.   Hematological: Negative.   Psychiatric/Behavioral: Negative.   All other systems reviewed and are negative.      Objective:   Physical Exam Vitals signs and nursing note reviewed.  Constitutional:      Appearance: Normal appearance.  Neck:     Musculoskeletal: Normal range of motion and neck supple.  Cardiovascular:     Rate and Rhythm: Normal rate and regular rhythm.     Pulses: Normal pulses.     Heart sounds: Normal heart sounds.  Pulmonary:     Effort: Pulmonary effort is normal.     Breath sounds: Normal breath sounds.  Musculoskeletal:     Comments: Normal Muscle Bulk and Muscle Testing Reveals:  Upper Extremities:Full  ROM and Muscle Strength 5/5  Lower Extremities: Full ROM and Muscle Strength 5/5 Arises from chair with ease Narrow Based  Gait   Skin:    General: Skin is warm and dry.  Neurological:     Mental Status: She is alert and oriented to person, place, and time.  Psychiatric:        Mood and  Affect: Mood normal.  Behavior: Behavior normal.           Assessment & Plan:  1. Lumbar postlaminectomy syndrome status post L5-S1 fusion with chronic S1 radiculopathy.Continuecurrent medication regimen withNortriptyline. Refilled:Hydrocodone 10/325 mg #145-one every 6 hours as needed for pain, may take an extra tablet on work days only. 06/21/2019. We will continue the opioid monitoring program, this consists of regular clinic visits, examinations, urine drug screen, pill counts as well as use of [place] Controlled Substance reporting System. 2. 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 Insomnia: Continuecurrent medication regimenAmbien.06/21/2019 4.Chronic Left Shoulder Pain: No complaints today: S/P Left Shoulder Arthroscopy with Rotator Cuff Repair on 05/18/2019 by Dr. Gunnar Fusi : Ortho Following. 06/21/2019  F/U in 1 month.  15 minutes of face to face patient care time was spent during this visit. All questions were encouraged and answered.

## 2019-07-10 DIAGNOSIS — N921 Excessive and frequent menstruation with irregular cycle: Secondary | ICD-10-CM | POA: Diagnosis not present

## 2019-07-10 DIAGNOSIS — N946 Dysmenorrhea, unspecified: Secondary | ICD-10-CM | POA: Diagnosis not present

## 2019-07-10 DIAGNOSIS — Z124 Encounter for screening for malignant neoplasm of cervix: Secondary | ICD-10-CM | POA: Diagnosis not present

## 2019-07-19 DIAGNOSIS — M545 Low back pain: Secondary | ICD-10-CM | POA: Diagnosis not present

## 2019-07-19 DIAGNOSIS — M4326 Fusion of spine, lumbar region: Secondary | ICD-10-CM | POA: Diagnosis not present

## 2019-07-21 ENCOUNTER — Other Ambulatory Visit: Payer: Self-pay

## 2019-07-21 ENCOUNTER — Encounter: Payer: Self-pay | Admitting: Registered Nurse

## 2019-07-21 ENCOUNTER — Encounter: Payer: BC Managed Care – PPO | Attending: Physical Medicine and Rehabilitation | Admitting: Registered Nurse

## 2019-07-21 VITALS — BP 141/90 | HR 100 | Temp 97.7°F | Ht 63.0 in | Wt 164.0 lb

## 2019-07-21 DIAGNOSIS — G894 Chronic pain syndrome: Secondary | ICD-10-CM | POA: Diagnosis not present

## 2019-07-21 DIAGNOSIS — T451X5A Adverse effect of antineoplastic and immunosuppressive drugs, initial encounter: Secondary | ICD-10-CM

## 2019-07-21 DIAGNOSIS — Z9221 Personal history of antineoplastic chemotherapy: Secondary | ICD-10-CM | POA: Insufficient documentation

## 2019-07-21 DIAGNOSIS — M961 Postlaminectomy syndrome, not elsewhere classified: Secondary | ICD-10-CM | POA: Insufficient documentation

## 2019-07-21 DIAGNOSIS — M79604 Pain in right leg: Secondary | ICD-10-CM | POA: Insufficient documentation

## 2019-07-21 DIAGNOSIS — M545 Low back pain: Secondary | ICD-10-CM | POA: Insufficient documentation

## 2019-07-21 DIAGNOSIS — T451X5S Adverse effect of antineoplastic and immunosuppressive drugs, sequela: Secondary | ICD-10-CM | POA: Diagnosis not present

## 2019-07-21 DIAGNOSIS — M5416 Radiculopathy, lumbar region: Secondary | ICD-10-CM

## 2019-07-21 DIAGNOSIS — F17210 Nicotine dependence, cigarettes, uncomplicated: Secondary | ICD-10-CM | POA: Diagnosis not present

## 2019-07-21 DIAGNOSIS — G8929 Other chronic pain: Secondary | ICD-10-CM | POA: Insufficient documentation

## 2019-07-21 DIAGNOSIS — G620 Drug-induced polyneuropathy: Secondary | ICD-10-CM | POA: Diagnosis not present

## 2019-07-21 DIAGNOSIS — Z760 Encounter for issue of repeat prescription: Secondary | ICD-10-CM | POA: Diagnosis not present

## 2019-07-21 DIAGNOSIS — Z79891 Long term (current) use of opiate analgesic: Secondary | ICD-10-CM

## 2019-07-21 DIAGNOSIS — M79605 Pain in left leg: Secondary | ICD-10-CM | POA: Diagnosis not present

## 2019-07-21 DIAGNOSIS — Z5181 Encounter for therapeutic drug level monitoring: Secondary | ICD-10-CM

## 2019-07-21 MED ORDER — NORTRIPTYLINE HCL 10 MG PO CAPS: ORAL_CAPSULE | ORAL | 3 refills | Status: DC

## 2019-07-21 MED ORDER — HYDROCODONE-ACETAMINOPHEN 10-325 MG PO TABS: 1.0000 | ORAL_TABLET | Freq: Four times a day (QID) | ORAL | 0 refills | Status: DC | PRN

## 2019-07-21 NOTE — Progress Notes (Signed)
Subjective:    Patient ID: Elizabeth Torres, female    DOB: [birth year], 43 y.o.   MRN: [id number]  HPI: Elizabeth Torres is a 43 y.o. female who returns for follow up appointment for chronic pain and medication refill. She states her pain is located in her lower back radiating into her bilateral lower extremities. Also reports on Monday  ( 07/17/2019) she started having  muscle spasms in her lower back,  it became intense and frequent. She went to see her PCP, she was prescribed muscle relaxer she states.  We discussed the above and other treatment modalities, she verbalizes understanding.  She rates her pain 8. Her current exercise regime is walking and performing stretching exercises.  Ms. Faust Morphine equivalent is 50.00 MME.  Last Oral Swab was Performed on 03/31/2019, it was consistent.    Pain Inventory Average Pain 8 Pain Right Now 8 My pain is constant, sharp, stabbing, aching and spasms  In the last 24 hours, has pain interfered with the following? General activity 7 Relation with others 7 Enjoyment of life 7 What TIME of day is your pain at its worst? all Sleep (in general) Poor  Pain is worse with: walking, bending, sitting, inactivity, standing and some activites Pain improves with: heat/ice and medication Relief from Meds: 1  Mobility Do you have any goals in this area?  no  Function Do you have any goals in this area?  no  Neuro/Psych trouble walking spasms  Prior Studies Any changes since last visit?  no  Physicians involved in your care Any changes since last visit?  no   Family History  Problem Relation Age of Onset  . Diabetes Mother   . Hypertension Mother   . Cancer Paternal Uncle   . Breast cancer Paternal Uncle        40'S  . Breast cancer Paternal Aunt        58'S   Social History   Socioeconomic History  . Marital status: Married    Spouse name: Not on file  . Number of children: Not on file  . Years of education: Not on file  . Highest  education level: Not on file  Occupational History  . Not on file  Tobacco Use  . Smoking status: Current Every Day Smoker    Packs/day: 1.00    Years: 20.00    Pack years: 20.00    Types: Cigarettes  . Smokeless tobacco: Never Used  Substance and Sexual Activity  . Alcohol use: Yes    Comment: socially  . Drug use: No  . Sexual activity: Not on file  Other Topics Concern  . Not on file  Social History Narrative  . Not on file   Social Determinants of Health   Financial Resource Strain:   . Difficulty of Paying Living Expenses: Not on file  Food Insecurity:   . Worried About Charity fundraiser in the Last Year: Not on file  . Ran Out of Food in the Last Year: Not on file  Transportation Needs:   . Lack of Transportation (Medical): Not on file  . Lack of Transportation (Non-Medical): Not on file  Physical Activity:   . Days of Exercise per Week: Not on file  . Minutes of Exercise per Session: Not on file  Stress:   . Feeling of Stress : Not on file  Social Connections:   . Frequency of Communication with Friends and Family: Not on file  . Frequency of  Social Gatherings with Friends and Family: Not on file  . Attends Religious Services: Not on file  . Active Member of Clubs or Organizations: Not on file  . Attends Archivist Meetings: Not on file  . Marital Status: Not on file   Past Surgical History:  Procedure Laterality Date  . BREAST BIOPSY Right 07/2011   invasive ductal carcinoma  . 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 RAd and chemo tx  . BREAST SURGERY    . mastectomy Right    partial with lymph node dissection  . PORT A CATH REVISION    . PORT-A-CATH REMOVAL  11-07-15   Dr Bary Castilla  . SHOULDER ARTHROSCOPY WITH ROTATOR CUFF REPAIR Left 05/18/2019   Procedure: SHOULDER ARTHROSCOPY WITH SUBACROMIAL DECOMPRESSION AND DISTAL CLAVICAL EXCISION, INTRAARTICULAR DEBRIDEMENT;  Surgeon: Lovell Sheehan, MD;   Location: [place];  Service: Orthopedics;  Laterality: Left;  . SPINE SURGERY  2008   Dr Joya Salm  . TUBAL LIGATION     Past Medical History:  Diagnosis Date  . BRCA negative   . Breast cancer ([place]) 2013   RT LUMPECTOMY with chemo and rad tx  . Degenerative disc disease, lumbar   . Personal history of chemotherapy 2013   for breast ca  . Personal history of radiation therapy 2013   F/U right breast cancer   . Radiation 2013   BREAST CA  . Status post chemotherapy 2013   BREAST CA   Temp 97.7 F (36.5 C)   Ht _0  (1.6 m)   Wt 164 lb (74.4 kg)   BMI 29.05 kg/m   Opioid Risk Score:   Fall Risk Score:  `1  Depression screen PHQ 2/9  Depression screen [facility] 2/9 04/26/2019 12/15/2018 10/14/2018 09/15/2018 07/15/2018 04/20/2018 12/02/2017  Decreased Interest 0 0 0 0 0 0 0  Down, Depressed, Hopeless 0 0 0 0 0 0 0  PHQ - 2 Score 0 0 0 0 0 0 0  Altered sleeping 0 - - - - - -  Tired, decreased energy 0 - - - - - -  Change in appetite 0 - - - - - -  Feeling bad or failure about yourself  0 - - - - - -  Trouble concentrating 0 - - - - - -  Moving slowly or fidgety/restless 0 - - - - - -  Suicidal thoughts 0 - - - - - -  PHQ-9 Score 0 - - - - - -  Difficult doing work/chores - - - - - - -  Some recent data might be hidden    Review of Systems  Constitutional: Negative.   HENT: Negative.   Eyes: Negative.   Respiratory: Negative.   Cardiovascular: Negative.   Gastrointestinal: Negative.   Endocrine: Negative.   Genitourinary: Negative.   Musculoskeletal: Positive for back pain.  Skin: Negative.   Allergic/Immunologic: Negative.   Neurological: Negative.   Hematological: Negative.   Psychiatric/Behavioral: Negative.   All other systems reviewed and are negative.      Objective:   Physical Exam Vitals and nursing note reviewed.  Constitutional:      Appearance: Normal appearance.  Cardiovascular:     Rate and Rhythm: Normal rate and regular rhythm.      Pulses: Normal pulses.     Heart sounds: Normal heart sounds.  Pulmonary:     Effort: Pulmonary effort is normal.     Breath sounds: Normal breath  sounds.  Musculoskeletal:     Cervical back: Normal range of motion and neck supple.     Comments: Normal Muscle Bulk and Muscle Testing Reveals:  Upper Extremities: Full ROM and Muscle Strength 5/5  Lumbar Paraspinal Tenderness: L-3-L-5 Lower Extremities: Full ROM and Muscle Strength 5/5 Arises from chair slowly Antalgic Gait   Skin:    General: Skin is warm and dry.  Neurological:     Mental Status: She is alert and oriented to person, place, and time.  Psychiatric:        Mood and Affect: Mood normal.        Behavior: Behavior normal.           Assessment & Plan:  1. Lumbar postlaminectomy syndrome status post L5-S1 fusion with chronic S1 radiculopathy.Continuecurrent medication regimen withNortriptyline. Refilled:Hydrocodone 10/325 mg #145-one every 6 hours as needed for pain, may take an extra tablet on work days only. 07/21/2019. We will continue the opioid monitoring program, this consists of regular clinic visits, examinations, urine drug screen, pill counts as well as use of [place] Controlled Substance reporting System. 2. 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 Insomnia: Continuecurrent medication regimenAmbien.07/21/2019 4.Chronic Left Shoulder Pain: No complaints today: S/P Left Shoulder Arthroscopy with Rotator Cuff Repair on 05/18/2019 by Dr. Gunnar Fusi: Ortho Following. 07/21/2019 5. Muscle Spasm: Continue Flexeril: PCP Following.  F/U in 1 month.  98mnutes of face to face patient care time was spent during this visit. All questions were encouraged and answered.

## 2019-08-09 DIAGNOSIS — N921 Excessive and frequent menstruation with irregular cycle: Secondary | ICD-10-CM | POA: Diagnosis not present

## 2019-08-09 DIAGNOSIS — R790 Abnormal level of blood mineral: Secondary | ICD-10-CM | POA: Diagnosis not present

## 2019-08-14 ENCOUNTER — Ambulatory Visit (INDEPENDENT_AMBULATORY_CARE_PROVIDER_SITE_OTHER): Payer: BC Managed Care – PPO

## 2019-08-14 ENCOUNTER — Other Ambulatory Visit: Payer: Self-pay

## 2019-08-14 DIAGNOSIS — Z8701 Personal history of pneumonia (recurrent): Secondary | ICD-10-CM | POA: Diagnosis not present

## 2019-08-14 DIAGNOSIS — Z23 Encounter for immunization: Secondary | ICD-10-CM

## 2019-08-14 NOTE — Progress Notes (Signed)
Pneum vax given

## 2019-08-18 ENCOUNTER — Encounter: Payer: Self-pay | Admitting: Registered Nurse

## 2019-08-18 ENCOUNTER — Encounter: Payer: BC Managed Care – PPO | Attending: Physical Medicine and Rehabilitation | Admitting: Registered Nurse

## 2019-08-18 ENCOUNTER — Other Ambulatory Visit: Payer: Self-pay

## 2019-08-18 VITALS — BP 125/88 | HR 88 | Temp 97.9°F | Ht 63.0 in | Wt 164.0 lb

## 2019-08-18 DIAGNOSIS — Z9221 Personal history of antineoplastic chemotherapy: Secondary | ICD-10-CM | POA: Insufficient documentation

## 2019-08-18 DIAGNOSIS — M961 Postlaminectomy syndrome, not elsewhere classified: Secondary | ICD-10-CM | POA: Diagnosis not present

## 2019-08-18 DIAGNOSIS — M79605 Pain in left leg: Secondary | ICD-10-CM | POA: Insufficient documentation

## 2019-08-18 DIAGNOSIS — G620 Drug-induced polyneuropathy: Secondary | ICD-10-CM | POA: Diagnosis not present

## 2019-08-18 DIAGNOSIS — Z79891 Long term (current) use of opiate analgesic: Secondary | ICD-10-CM | POA: Insufficient documentation

## 2019-08-18 DIAGNOSIS — Z5181 Encounter for therapeutic drug level monitoring: Secondary | ICD-10-CM | POA: Diagnosis not present

## 2019-08-18 DIAGNOSIS — G894 Chronic pain syndrome: Secondary | ICD-10-CM | POA: Diagnosis not present

## 2019-08-18 DIAGNOSIS — T451X5S Adverse effect of antineoplastic and immunosuppressive drugs, sequela: Secondary | ICD-10-CM | POA: Diagnosis not present

## 2019-08-18 DIAGNOSIS — G8929 Other chronic pain: Secondary | ICD-10-CM | POA: Insufficient documentation

## 2019-08-18 DIAGNOSIS — M545 Low back pain: Secondary | ICD-10-CM | POA: Diagnosis not present

## 2019-08-18 DIAGNOSIS — Z760 Encounter for issue of repeat prescription: Secondary | ICD-10-CM | POA: Insufficient documentation

## 2019-08-18 DIAGNOSIS — M79604 Pain in right leg: Secondary | ICD-10-CM | POA: Diagnosis not present

## 2019-08-18 DIAGNOSIS — F17210 Nicotine dependence, cigarettes, uncomplicated: Secondary | ICD-10-CM | POA: Insufficient documentation

## 2019-08-18 DIAGNOSIS — M5416 Radiculopathy, lumbar region: Secondary | ICD-10-CM

## 2019-08-18 DIAGNOSIS — T451X5A Adverse effect of antineoplastic and immunosuppressive drugs, initial encounter: Secondary | ICD-10-CM

## 2019-08-18 MED ORDER — HYDROCODONE-ACETAMINOPHEN 10-325 MG PO TABS: 1.0000 | ORAL_TABLET | Freq: Four times a day (QID) | ORAL | 0 refills | Status: DC | PRN

## 2019-08-18 NOTE — Progress Notes (Signed)
Subjective:    Patient ID: Elizabeth Torres, female    DOB: 06-Jun-1976, 44 y.o.   MRN: [id number]  HPI: Elizabeth Torres is a 44 y.o. female who returns for follow up appointment for chronic pain and medication refill. She states her pain is located in her lower back radiating into her bilateral lower extremities. She rates her pain 7. Her current exercise regime is walking and performing stretching exercises.  Elizabeth Torres Morphine equivalent is  50.00 MME.  Oral Swab was Performed today.    Pain Inventory Average Pain 7 Pain Right Now 7 My pain is constant, dull and aching  In the last 24 hours, has pain interfered with the following? General activity 6 Relation with others 6 Enjoyment of life 6 What TIME of day is your pain at its worst? daytime Sleep (in general) Fair  Pain is worse with: sitting, inactivity and standing Pain improves with: rest, heat/ice and medication Relief from Meds: 4  Mobility Do you have any goals in this area?  no  Function Do you have any goals in this area?  no  Neuro/Psych No problems in this area  Prior Studies Any changes since last visit?  no  Physicians involved in your care Any changes since last visit?  no   Family History  Problem Relation Age of Onset  . Diabetes Mother   . Hypertension Mother   . Cancer Paternal Uncle   . Breast cancer Paternal Uncle        40'S  . Breast cancer Paternal Aunt        72'S   Social History   Socioeconomic History  . Marital status: Married    Spouse name: Not on file  . Number of children: Not on file  . Years of education: Not on file  . Highest education level: Not on file  Occupational History  . Not on file  Tobacco Use  . Smoking status: Current Every Day Smoker    Packs/day: 1.00    Years: 20.00    Pack years: 20.00    Types: Cigarettes  . Smokeless tobacco: Never Used  Substance and Sexual Activity  . Alcohol use: Yes    Comment: socially  . Drug use: No  . Sexual activity:  Not on file  Other Topics Concern  . Not on file  Social History Narrative  . Not on file   Social Determinants of Health   Financial Resource Strain:   . Difficulty of Paying Living Expenses: Not on file  Food Insecurity:   . Worried About Charity fundraiser in the Last Year: Not on file  . Ran Out of Food in the Last Year: Not on file  Transportation Needs:   . Lack of Transportation (Medical): Not on file  . Lack of Transportation (Non-Medical): Not on file  Physical Activity:   . Days of Exercise per Week: Not on file  . Minutes of Exercise per Session: Not on file  Stress:   . Feeling of Stress : Not on file  Social Connections:   . Frequency of Communication with Friends and Family: Not on file  . Frequency of Social Gatherings with Friends and Family: Not on file  . Attends Religious Services: Not on file  . Active Member of Clubs or Organizations: Not on file  . Attends Archivist Meetings: Not on file  . Marital Status: Not on file   Past Surgical History:  Procedure Laterality Date  .  BREAST BIOPSY Right 07/2011   invasive ductal carcinoma  . 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 RAd and chemo tx  . BREAST SURGERY    . mastectomy Right    partial with lymph node dissection  . PORT A CATH REVISION    . PORT-A-CATH REMOVAL  11-07-15   Dr Bary Castilla  . SHOULDER ARTHROSCOPY WITH ROTATOR CUFF REPAIR Left 05/18/2019   Procedure: SHOULDER ARTHROSCOPY WITH SUBACROMIAL DECOMPRESSION AND DISTAL CLAVICAL EXCISION, INTRAARTICULAR DEBRIDEMENT;  Surgeon: Lovell Sheehan, MD;  Location: [place];  Service: Orthopedics;  Laterality: Left;  . SPINE SURGERY  2008   Dr Joya Salm  . TUBAL LIGATION     Past Medical History:  Diagnosis Date  . BRCA negative   . Breast cancer ([place]) 2013   RT LUMPECTOMY with chemo and rad tx  . Degenerative disc disease, lumbar   . Personal history of chemotherapy 2013   for breast  ca  . Personal history of radiation therapy 2013   F/U right breast cancer   . Radiation 2013   BREAST CA  . Status post chemotherapy 2013   BREAST CA   BP 125/88   Pulse 88   Temp 97.9 F (36.6 C)   Ht '5\' 3"'  (1.6 m)   Wt 164 lb (74.4 kg)   SpO2 96%   BMI 29.05 kg/m   Opioid Risk Score:   Fall Risk Score:  `1  Depression screen PHQ 2/9  Depression screen [facility] 2/9 04/26/2019 12/15/2018 10/14/2018 09/15/2018 07/15/2018 04/20/2018 12/02/2017  Decreased Interest 0 0 0 0 0 0 0  Down, Depressed, Hopeless 0 0 0 0 0 0 0  PHQ - 2 Score 0 0 0 0 0 0 0  Altered sleeping 0 - - - - - -  Tired, decreased energy 0 - - - - - -  Change in appetite 0 - - - - - -  Feeling bad or failure about yourself  0 - - - - - -  Trouble concentrating 0 - - - - - -  Moving slowly or fidgety/restless 0 - - - - - -  Suicidal thoughts 0 - - - - - -  PHQ-9 Score 0 - - - - - -  Difficult doing work/chores - - - - - - -  Some recent data might be hidden    Review of Systems  Constitutional: Negative.   HENT: Negative.   Eyes: Negative.   Respiratory: Negative.   Cardiovascular: Negative.   Gastrointestinal: Negative.   Endocrine: Negative.   Genitourinary: Negative.   Musculoskeletal: Positive for back pain.  Skin: Negative.   Allergic/Immunologic: Negative.   Neurological: Negative.   Hematological: Negative.   Psychiatric/Behavioral: Negative.   All other systems reviewed and are negative.      Objective:   Physical Exam Vitals and nursing note reviewed.  Constitutional:      Appearance: Normal appearance.  Cardiovascular:     Rate and Rhythm: Normal rate and regular rhythm.     Pulses: Normal pulses.     Heart sounds: Normal heart sounds.  Pulmonary:     Effort: Pulmonary effort is normal.     Breath sounds: Normal breath sounds.  Musculoskeletal:     Cervical back: Normal range of motion and neck supple.     Comments: Normal Muscle Bulk and Muscle Testing Reveals:  Upper Extremities:  Full ROM and Muscle Strength 5/5 Back without spinal tenderness noted  Lower  Extremities: Full ROM and Muscle Strength 5/5 Arises from chair with ease Narrow Based Gait   Skin:    General: Skin is warm and dry.  Neurological:     Mental Status: She is alert and oriented to person, place, and time.  Psychiatric:        Mood and Affect: Mood normal.        Behavior: Behavior normal.           Assessment & Plan:  1. Lumbar postlaminectomy syndrome status post L5-S1 fusion with chronic S1 radiculopathy.Continuecurrent medication regimen withNortriptyline. 08/18/2019. Refilled:Hydrocodone 10/325 mg #145-one every 6 hours as needed for pain, may take an extra tablet on work days only.08/18/2019. We will continue the opioid monitoring program, this consists of regular clinic visits, examinations, urine drug screen, pill counts as well as use of [place] Controlled Substance reporting System. 2. 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 Insomnia: Continuecurrent medication regimenAmbien.08/18/2019 4.Chronic Left Shoulder Pain:No complaints today: S/PLeft Shoulder Arthroscopy with Rotator Cuff Repair on 05/18/2019 by Dr. Gunnar Fusi: Ortho Following.08/18/2019 5. Muscle Spasm: Continue Flexeril: PCP Following.  F/U in 1 month.08/18/2019.  50mnutes of face to face patient care time was spent during this visit. All questions were encouraged and answered.

## 2019-08-24 LAB — DRUG TOX MONITOR 1 W/CONF, ORAL FLD
Amphetamines: NEGATIVE ng/mL (ref <10)
Barbiturates: NEGATIVE ng/mL (ref <10)
Benzodiazepines: NEGATIVE ng/mL (ref <0.50)
Buprenorphine: NEGATIVE ng/mL (ref <0.10)
Cocaine: NEGATIVE ng/mL (ref <5.0)
Codeine: NEGATIVE ng/mL (ref <2.5)
Cotinine: 106.2 ng/mL — ABNORMAL HIGH (ref <5.0)
Dihydrocodeine: 11.6 ng/mL — ABNORMAL HIGH (ref <2.5)
Fentanyl: NEGATIVE ng/mL (ref <0.10)
Heroin Metabolite: NEGATIVE ng/mL (ref <1.0)
Hydrocodone: 219.4 ng/mL — ABNORMAL HIGH (ref <2.5)
Hydromorphone: NEGATIVE ng/mL (ref <2.5)
MARIJUANA: NEGATIVE ng/mL (ref <2.5)
MDMA: NEGATIVE ng/mL (ref <10)
Meprobamate: NEGATIVE ng/mL (ref <2.5)
Methadone: NEGATIVE ng/mL (ref <5.0)
Morphine: NEGATIVE ng/mL (ref <2.5)
Nicotine Metabolite: POSITIVE ng/mL — AB (ref <5.0)
Norhydrocodone: 19.8 ng/mL — ABNORMAL HIGH (ref <2.5)
Noroxycodone: NEGATIVE ng/mL (ref <2.5)
Opiates: POSITIVE ng/mL — AB (ref <2.5)
Oxycodone: NEGATIVE ng/mL (ref <2.5)
Oxymorphone: NEGATIVE ng/mL (ref <2.5)
Phencyclidine: NEGATIVE ng/mL (ref <10)
Tapentadol: NEGATIVE ng/mL (ref <5.0)
Tramadol: NEGATIVE ng/mL (ref <5.0)
Zolpidem: NEGATIVE ng/mL (ref <5.0)

## 2019-08-24 LAB — DRUG TOX ALC METAB W/CON, ORAL FLD: Alcohol Metabolite: NEGATIVE ng/mL (ref <25)

## 2019-08-25 ENCOUNTER — Telehealth: Payer: Self-pay

## 2019-08-25 NOTE — Telephone Encounter (Signed)
UDS CONSISTENT SCREENING FOR RX

## 2019-08-30 ENCOUNTER — Telehealth: Payer: Self-pay | Admitting: *Deleted

## 2019-08-30 NOTE — Telephone Encounter (Signed)
Patient is needing her hydrocodone sent in to pharmacy ASAP.  She has stretched them as long as she can

## 2019-08-31 NOTE — Telephone Encounter (Signed)
Hydrocodone was e-scribed on 08/18/2019, spoke with Mancel Parsons this am regarding the call from Ms. Baxley. He called the pharmacy and Ms. Mellado is aware of the above.

## 2019-09-04 LAB — HM PAP SMEAR: HM Pap smear: NORMAL

## 2019-09-05 DIAGNOSIS — Z01818 Encounter for other preprocedural examination: Secondary | ICD-10-CM | POA: Diagnosis not present

## 2019-09-08 DIAGNOSIS — Z3200 Encounter for pregnancy test, result unknown: Secondary | ICD-10-CM | POA: Diagnosis not present

## 2019-09-08 DIAGNOSIS — N921 Excessive and frequent menstruation with irregular cycle: Secondary | ICD-10-CM | POA: Diagnosis not present

## 2019-09-29 ENCOUNTER — Other Ambulatory Visit: Payer: Self-pay

## 2019-09-29 ENCOUNTER — Encounter: Payer: Self-pay | Admitting: Registered Nurse

## 2019-09-29 ENCOUNTER — Encounter: Payer: BC Managed Care – PPO | Attending: Physical Medicine and Rehabilitation | Admitting: Registered Nurse

## 2019-09-29 VITALS — BP 137/93 | HR 96 | Temp 97.9°F | Ht 63.0 in | Wt 163.0 lb

## 2019-09-29 DIAGNOSIS — M961 Postlaminectomy syndrome, not elsewhere classified: Secondary | ICD-10-CM | POA: Diagnosis not present

## 2019-09-29 DIAGNOSIS — T451X5S Adverse effect of antineoplastic and immunosuppressive drugs, sequela: Secondary | ICD-10-CM | POA: Insufficient documentation

## 2019-09-29 DIAGNOSIS — M545 Low back pain: Secondary | ICD-10-CM | POA: Insufficient documentation

## 2019-09-29 DIAGNOSIS — M79605 Pain in left leg: Secondary | ICD-10-CM | POA: Insufficient documentation

## 2019-09-29 DIAGNOSIS — M79604 Pain in right leg: Secondary | ICD-10-CM | POA: Diagnosis not present

## 2019-09-29 DIAGNOSIS — G8929 Other chronic pain: Secondary | ICD-10-CM | POA: Insufficient documentation

## 2019-09-29 DIAGNOSIS — Z5181 Encounter for therapeutic drug level monitoring: Secondary | ICD-10-CM | POA: Insufficient documentation

## 2019-09-29 DIAGNOSIS — Z79891 Long term (current) use of opiate analgesic: Secondary | ICD-10-CM

## 2019-09-29 DIAGNOSIS — F17210 Nicotine dependence, cigarettes, uncomplicated: Secondary | ICD-10-CM | POA: Insufficient documentation

## 2019-09-29 DIAGNOSIS — Z760 Encounter for issue of repeat prescription: Secondary | ICD-10-CM | POA: Diagnosis not present

## 2019-09-29 DIAGNOSIS — G894 Chronic pain syndrome: Secondary | ICD-10-CM | POA: Diagnosis not present

## 2019-09-29 DIAGNOSIS — G620 Drug-induced polyneuropathy: Secondary | ICD-10-CM | POA: Insufficient documentation

## 2019-09-29 DIAGNOSIS — M5416 Radiculopathy, lumbar region: Secondary | ICD-10-CM

## 2019-09-29 DIAGNOSIS — Z9221 Personal history of antineoplastic chemotherapy: Secondary | ICD-10-CM | POA: Diagnosis not present

## 2019-09-29 DIAGNOSIS — T451X5A Adverse effect of antineoplastic and immunosuppressive drugs, initial encounter: Secondary | ICD-10-CM

## 2019-09-29 MED ORDER — HYDROCODONE-ACETAMINOPHEN 10-325 MG PO TABS: 1.0000 | ORAL_TABLET | Freq: Four times a day (QID) | ORAL | 0 refills | Status: DC | PRN

## 2019-09-29 MED ORDER — NORTRIPTYLINE HCL 10 MG PO CAPS: ORAL_CAPSULE | ORAL | 3 refills | Status: DC

## 2019-09-29 MED ORDER — ZOLPIDEM TARTRATE 5 MG PO TABS: ORAL_TABLET | ORAL | 3 refills | Status: DC

## 2019-09-29 NOTE — Progress Notes (Signed)
Subjective:    Patient ID: Elizabeth Torres, female    DOB: Oct 14, 1975, 44 y.o.   MRN: [id number]  HPI: Elizabeth Torres is a 44 y.o. female who returns for follow up appointment for chronic pain and medication refill. She states her pain is located in her lower back radiating into her bilateral lower extremities. She rates her pain 4. Her current exercise regime is walking and performing stretching exercises.  Ms. Rabbani Morphine equivalent is 50.00 MME.    Last Oral Swab was Performed on 08/18/2019, it was consistent.   Pain Inventory Average Pain 4 Pain Right Now 4 My pain is constant, dull and aching  In the last 24 hours, has pain interfered with the following? General activity 4 Relation with others 4 Enjoyment of life 4 What TIME of day is your pain at its worst? evening Sleep (in general) Fair  Pain is worse with: sitting, inactivity, standing and some activites Pain improves with: rest, heat/ice and medication Relief from Meds: 5  Mobility walk without assistance Do you have any goals in this area?  no  Function Do you have any goals in this area?  no  Neuro/Psych No problems in this area  Prior Studies Any changes since last visit?  no  Physicians involved in your care Any changes since last visit?  no   Family History  Problem Relation Age of Onset  . Diabetes Mother   . Hypertension Mother   . Cancer Paternal Uncle   . Breast cancer Paternal Uncle        40'S  . Breast cancer Paternal Aunt        52'S   Social History   Socioeconomic History  . Marital status: Married    Spouse name: Not on file  . Number of children: Not on file  . Years of education: Not on file  . Highest education level: Not on file  Occupational History  . Not on file  Tobacco Use  . Smoking status: Current Every Day Smoker    Packs/day: 1.00    Years: 20.00    Pack years: 20.00    Types: Cigarettes  . Smokeless tobacco: Never Used  Substance and Sexual Activity  .  Alcohol use: Yes    Comment: socially  . Drug use: No  . Sexual activity: Not on file  Other Topics Concern  . Not on file  Social History Narrative  . Not on file   Social Determinants of Health   Financial Resource Strain:   . Difficulty of Paying Living Expenses: Not on file  Food Insecurity:   . Worried About Charity fundraiser in the Last Year: Not on file  . Ran Out of Food in the Last Year: Not on file  Transportation Needs:   . Lack of Transportation (Medical): Not on file  . Lack of Transportation (Non-Medical): Not on file  Physical Activity:   . Days of Exercise per Week: Not on file  . Minutes of Exercise per Session: Not on file  Stress:   . Feeling of Stress : Not on file  Social Connections:   . Frequency of Communication with Friends and Family: Not on file  . Frequency of Social Gatherings with Friends and Family: Not on file  . Attends Religious Services: Not on file  . Active Member of Clubs or Organizations: Not on file  . Attends Archivist Meetings: Not on file  . Marital Status: Not on file  Past Surgical History:  Procedure Laterality Date  . BREAST BIOPSY Right 07/2011   invasive ductal carcinoma  . 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 RAd and chemo tx  . BREAST SURGERY    . mastectomy Right    partial with lymph node dissection  . PORT A CATH REVISION    . PORT-A-CATH REMOVAL  11-07-15   Dr Bary Castilla  . SHOULDER ARTHROSCOPY WITH ROTATOR CUFF REPAIR Left 05/18/2019   Procedure: SHOULDER ARTHROSCOPY WITH SUBACROMIAL DECOMPRESSION AND DISTAL CLAVICAL EXCISION, INTRAARTICULAR DEBRIDEMENT;  Surgeon: Lovell Sheehan, MD;  Location: [place];  Service: Orthopedics;  Laterality: Left;  . SPINE SURGERY  2008   Dr Joya Salm  . TUBAL LIGATION     Past Medical History:  Diagnosis Date  . BRCA negative   . Breast cancer ([place]) 2013   RT LUMPECTOMY with chemo and rad tx  . Degenerative  disc disease, lumbar   . Personal history of chemotherapy 2013   for breast ca  . Personal history of radiation therapy 2013   F/U right breast cancer   . Radiation 2013   BREAST CA  . Status post chemotherapy 2013   BREAST CA   There were no vitals taken for this visit.  Opioid Risk Score:   Fall Risk Score:  `1  Depression screen PHQ 2/9  Depression screen [facility] 2/9 04/26/2019 12/15/2018 10/14/2018 09/15/2018 07/15/2018 04/20/2018 12/02/2017  Decreased Interest 0 0 0 0 0 0 0  Down, Depressed, Hopeless 0 0 0 0 0 0 0  PHQ - 2 Score 0 0 0 0 0 0 0  Altered sleeping 0 - - - - - -  Tired, decreased energy 0 - - - - - -  Change in appetite 0 - - - - - -  Feeling bad or failure about yourself  0 - - - - - -  Trouble concentrating 0 - - - - - -  Moving slowly or fidgety/restless 0 - - - - - -  Suicidal thoughts 0 - - - - - -  PHQ-9 Score 0 - - - - - -  Difficult doing work/chores - - - - - - -  Some recent data might be hidden     Review of Systems  Constitutional: Negative.   HENT: Negative.   Eyes: Negative.   Respiratory: Negative.   Cardiovascular: Negative.   Gastrointestinal: Negative.   Endocrine: Negative.   Genitourinary: Negative.   Musculoskeletal: Positive for arthralgias and back pain.  Skin: Negative.   Allergic/Immunologic: Negative.   Neurological: Negative.   Hematological: Negative.   Psychiatric/Behavioral: Negative.   All other systems reviewed and are negative.      Objective:   Physical Exam Vitals and nursing note reviewed.  Constitutional:      Appearance: Normal appearance.  Cardiovascular:     Rate and Rhythm: Normal rate and regular rhythm.     Pulses: Normal pulses.     Heart sounds: Normal heart sounds.  Pulmonary:     Effort: Pulmonary effort is normal.     Breath sounds: Normal breath sounds.  Musculoskeletal:     Cervical back: Normal range of motion and neck supple.     Comments: Normal Muscle Bulk and Muscle Testing Reveals:  Upper  Extremities: Full ROM and Muscle Strength 5/5  Lumbar Paraspinal Tenderness: L-3-L-5 Lower Extremities: Full ROM and Muscle Strength 5/5 Arises from Table with ease Narrow Based  Gait  Skin:    General: Skin is warm and dry.  Neurological:     Mental Status: She is alert and oriented to person, place, and time.  Psychiatric:        Mood and Affect: Mood normal.        Behavior: Behavior normal.           Assessment & Plan:  1. Lumbar postlaminectomy syndrome status post L5-S1 fusion with chronic S1 radiculopathy.Continuecurrent medication regimen withNortriptyline. 09/29/2019. Refilled:Hydrocodone 10/325 mg #145-one every 6 hours as needed for pain, may take an extra tablet on work days only.09/29/2019. We will continue the opioid monitoring program, this consists of regular clinic visits, examinations, urine drug screen, pill counts as well as use of [place] Controlled Substance reporting System. 2. 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 Insomnia: Continuecurrent medication regimenAmbien.09/29/2019 4.Chronic Left Shoulder Pain:No complaints today: S/PLeft Shoulder Arthroscopy with Rotator Cuff Repair on 05/18/2019 by Dr. Gunnar Fusi: Ortho Following.09/29/2019 5. Muscle Spasm: Continue Flexeril: PCP Following. F/U in 1 month.09/29/2019.  67mnutes of face to face patient care time was spent during this visit. All questions were encouraged and answered.  F/U in 1 month

## 2019-10-27 ENCOUNTER — Encounter: Payer: BC Managed Care – PPO | Attending: Physical Medicine and Rehabilitation | Admitting: Registered Nurse

## 2019-10-27 ENCOUNTER — Encounter: Payer: Self-pay | Admitting: Registered Nurse

## 2019-10-27 ENCOUNTER — Other Ambulatory Visit: Payer: Self-pay

## 2019-10-27 VITALS — BP 147/90 | HR 91 | Temp 97.5°F | Ht 63.0 in | Wt 161.4 lb

## 2019-10-27 DIAGNOSIS — M545 Low back pain: Secondary | ICD-10-CM | POA: Diagnosis not present

## 2019-10-27 DIAGNOSIS — T451X5A Adverse effect of antineoplastic and immunosuppressive drugs, initial encounter: Secondary | ICD-10-CM

## 2019-10-27 DIAGNOSIS — M79605 Pain in left leg: Secondary | ICD-10-CM | POA: Insufficient documentation

## 2019-10-27 DIAGNOSIS — G620 Drug-induced polyneuropathy: Secondary | ICD-10-CM | POA: Insufficient documentation

## 2019-10-27 DIAGNOSIS — Z5181 Encounter for therapeutic drug level monitoring: Secondary | ICD-10-CM | POA: Insufficient documentation

## 2019-10-27 DIAGNOSIS — F17210 Nicotine dependence, cigarettes, uncomplicated: Secondary | ICD-10-CM | POA: Diagnosis not present

## 2019-10-27 DIAGNOSIS — Z760 Encounter for issue of repeat prescription: Secondary | ICD-10-CM | POA: Insufficient documentation

## 2019-10-27 DIAGNOSIS — Z9221 Personal history of antineoplastic chemotherapy: Secondary | ICD-10-CM | POA: Insufficient documentation

## 2019-10-27 DIAGNOSIS — T451X5S Adverse effect of antineoplastic and immunosuppressive drugs, sequela: Secondary | ICD-10-CM | POA: Insufficient documentation

## 2019-10-27 DIAGNOSIS — M961 Postlaminectomy syndrome, not elsewhere classified: Secondary | ICD-10-CM

## 2019-10-27 DIAGNOSIS — Z79891 Long term (current) use of opiate analgesic: Secondary | ICD-10-CM | POA: Insufficient documentation

## 2019-10-27 DIAGNOSIS — G894 Chronic pain syndrome: Secondary | ICD-10-CM | POA: Insufficient documentation

## 2019-10-27 DIAGNOSIS — G8929 Other chronic pain: Secondary | ICD-10-CM | POA: Diagnosis not present

## 2019-10-27 DIAGNOSIS — M5416 Radiculopathy, lumbar region: Secondary | ICD-10-CM | POA: Diagnosis not present

## 2019-10-27 DIAGNOSIS — M79604 Pain in right leg: Secondary | ICD-10-CM | POA: Diagnosis not present

## 2019-10-27 MED ORDER — HYDROCODONE-ACETAMINOPHEN 10-325 MG PO TABS: 1.0000 | ORAL_TABLET | Freq: Four times a day (QID) | ORAL | 0 refills | Status: DC | PRN

## 2019-10-27 NOTE — Progress Notes (Signed)
Subjective:    Patient ID: Elizabeth Torres, female    DOB: [birth year], 44 y.o.   MRN: [id number]  HPI: Elizabeth Torres is a 44 y.o. female who returns for follow up appointment for chronic pain and medication refill. She states her pain is located in her lower back radiating into her lower extremities. She rates her pain 4. Her current exercise regime is walking and performing stretching exercises.  [name] Morphine equivalent is 50.00 MME.  Last UDS was Performed on 08/18/2019, it was consistent.   Pain Inventory Average Pain 4 Pain Right Now 4 My pain is constant, dull and aching  In the last 24 hours, has pain interfered with the following? General activity 3 Relation with others 3 Enjoyment of life 3 What TIME of day is your pain at its worst? daytime Sleep (in general) Fair  Pain is worse with: sitting, inactivity and standing Pain improves with: rest, heat/ice and medication Relief from Meds: 5  Mobility walk without assistance how many minutes can you walk? 10-15  Function employed # of hrs/week 45  Neuro/Psych No problems in this area  Prior Studies none  Physicians involved in your care Primary care .   Family History  Problem Relation Age of Onset  . Diabetes Mother   . Hypertension Mother   . Cancer Paternal Uncle   . Breast cancer Paternal Uncle        40'S  . Breast cancer Paternal Aunt        57'S   Social History   Socioeconomic History  . Marital status: Married    Spouse name: Not on file  . Number of children: Not on file  . Years of education: Not on file  . Highest education level: Not on file  Occupational History  . Not on file  Tobacco Use  . Smoking status: Current Every Day Smoker    Packs/day: 1.00    Years: 20.00    Pack years: 20.00    Types: Cigarettes  . Smokeless tobacco: Never Used  Substance and Sexual Activity  . Alcohol use: Yes    Comment: socially  . Drug use: No  . Sexual activity: Not on file  Other Topics  Concern  . Not on file  Social History Narrative  . Not on file   Social Determinants of Health   Financial Resource Strain:   . Difficulty of Paying Living Expenses:   Food Insecurity:   . Worried About Charity fundraiser in the Last Year:   . Arboriculturist in the Last Year:   Transportation Needs:   . Film/video editor (Medical):   Marland Kitchen Lack of Transportation (Non-Medical):   Physical Activity:   . Days of Exercise per Week:   . Minutes of Exercise per Session:   Stress:   . Feeling of Stress :   Social Connections:   . Frequency of Communication with Friends and Family:   . Frequency of Social Gatherings with Friends and Family:   . Attends Religious Services:   . Active Member of Clubs or Organizations:   . Attends Archivist Meetings:   Marland Kitchen Marital Status:    Past Surgical History:  Procedure Laterality Date  . BREAST BIOPSY Right 07/2011   invasive ductal carcinoma  . 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 RAd and chemo tx  . BREAST SURGERY    . mastectomy Right    partial  with lymph node dissection  . PORT A CATH REVISION    . PORT-A-CATH REMOVAL  11-07-15   Dr Bary Castilla  . SHOULDER ARTHROSCOPY WITH ROTATOR CUFF REPAIR Left 05/18/2019   Procedure: SHOULDER ARTHROSCOPY WITH SUBACROMIAL DECOMPRESSION AND DISTAL CLAVICAL EXCISION, INTRAARTICULAR DEBRIDEMENT;  Surgeon: Lovell Sheehan, MD;  Location: [place];  Service: Orthopedics;  Laterality: Left;  . SPINE SURGERY  2008   Dr Joya Salm  . TUBAL LIGATION     Past Medical History:  Diagnosis Date  . BRCA negative   . Breast cancer ([place]) 2013   RT LUMPECTOMY with chemo and rad tx  . Degenerative disc disease, lumbar   . Personal history of chemotherapy 2013   for breast ca  . Personal history of radiation therapy 2013   F/U right breast cancer   . Radiation 2013   BREAST CA  . Status post chemotherapy 2013   BREAST CA   BP (!) 137/92    Pulse (!) 104   Temp (!) 97.5 F (36.4 C)   Ht _0  (1.6 m)   Wt 161 lb 6.4 oz (73.2 kg)   SpO2 95%   BMI 28.59 kg/m   Opioid Risk Score:   Fall Risk Score:  `1  Depression screen PHQ 2/9  Depression screen [facility] 2/9 04/26/2019 12/15/2018 10/14/2018 09/15/2018 07/15/2018 04/20/2018 12/02/2017  Decreased Interest 0 0 0 0 0 0 0  Down, Depressed, Hopeless 0 0 0 0 0 0 0  PHQ - 2 Score 0 0 0 0 0 0 0  Altered sleeping 0 - - - - - -  Tired, decreased energy 0 - - - - - -  Change in appetite 0 - - - - - -  Feeling bad or failure about yourself  0 - - - - - -  Trouble concentrating 0 - - - - - -  Moving slowly or fidgety/restless 0 - - - - - -  Suicidal thoughts 0 - - - - - -  PHQ-9 Score 0 - - - - - -  Difficult doing work/chores - - - - - - -  Some recent data might be hidden    Review of Systems  All other systems reviewed and are negative.      Objective:   Physical Exam Vitals and nursing note reviewed.  Constitutional:      Appearance: Normal appearance.  Cardiovascular:     Rate and Rhythm: Normal rate and regular rhythm.     Pulses: Normal pulses.     Heart sounds: Normal heart sounds.  Pulmonary:     Effort: Pulmonary effort is normal.     Breath sounds: Normal breath sounds.  Musculoskeletal:     Cervical back: Normal range of motion and neck supple.     Comments: Normal Muscle Bulk and Muscle Testing Reveals:  Upper Extremities: Full ROM and Muscle Strength 5/5 Back without spinal tenderness noted Lower Extremities: Full ROM and Muscle Strength 5/5 Arises from chair with ease Narrow Based Gait   Skin:    General: Skin is warm and dry.  Neurological:     Mental Status: She is alert and oriented to person, place, and time.  Psychiatric:        Mood and Affect: Mood normal.        Behavior: Behavior normal.           Assessment & Plan:  1. Lumbar postlaminectomy syndrome status post L5-S1 fusion with chronic S1 radiculopathy.Continuecurrent medication  regimen withNortriptyline.10/27/2019. Refilled:Hydrocodone 10/325 mg #145-one every 6 hours as needed for pain, may take an extra tablet on work days only.10/27/2019. We will continue the opioid monitoring program, this consists of regular clinic visits, examinations, urine drug screen, pill counts as well as use of [place] Controlled Substance reporting System. 2. 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 Insomnia: Continuecurrent medication regimenAmbien.10/27/2019 4.Chronic Left Shoulder Pain:No complaints today: S/PLeft Shoulder Arthroscopy with Rotator Cuff Repair on 05/18/2019 by Dr. Gunnar Fusi: Ortho Following.10/27/2019 5. Muscle Spasm: Continue Flexeril: PCP Following. F/U in 1 month.10/27/2019.  26mnutes of face to face patient care time was spent during this visit. All questions were encouraged and answered.  F/U in 1 month

## 2019-11-24 ENCOUNTER — Encounter: Payer: Self-pay | Admitting: Registered Nurse

## 2019-11-24 ENCOUNTER — Other Ambulatory Visit: Payer: Self-pay

## 2019-11-24 ENCOUNTER — Encounter: Payer: BC Managed Care – PPO | Attending: Physical Medicine and Rehabilitation | Admitting: Registered Nurse

## 2019-11-24 VITALS — BP 144/96 | HR 95 | Temp 98.7°F | Ht 63.0 in | Wt 165.4 lb

## 2019-11-24 DIAGNOSIS — G894 Chronic pain syndrome: Secondary | ICD-10-CM | POA: Diagnosis not present

## 2019-11-24 DIAGNOSIS — Z5181 Encounter for therapeutic drug level monitoring: Secondary | ICD-10-CM | POA: Diagnosis not present

## 2019-11-24 DIAGNOSIS — M79605 Pain in left leg: Secondary | ICD-10-CM | POA: Diagnosis not present

## 2019-11-24 DIAGNOSIS — T451X5S Adverse effect of antineoplastic and immunosuppressive drugs, sequela: Secondary | ICD-10-CM | POA: Insufficient documentation

## 2019-11-24 DIAGNOSIS — T451X5A Adverse effect of antineoplastic and immunosuppressive drugs, initial encounter: Secondary | ICD-10-CM

## 2019-11-24 DIAGNOSIS — G620 Drug-induced polyneuropathy: Secondary | ICD-10-CM | POA: Insufficient documentation

## 2019-11-24 DIAGNOSIS — Z79891 Long term (current) use of opiate analgesic: Secondary | ICD-10-CM | POA: Diagnosis not present

## 2019-11-24 DIAGNOSIS — M961 Postlaminectomy syndrome, not elsewhere classified: Secondary | ICD-10-CM

## 2019-11-24 DIAGNOSIS — M79604 Pain in right leg: Secondary | ICD-10-CM | POA: Insufficient documentation

## 2019-11-24 DIAGNOSIS — F17210 Nicotine dependence, cigarettes, uncomplicated: Secondary | ICD-10-CM | POA: Insufficient documentation

## 2019-11-24 DIAGNOSIS — M5416 Radiculopathy, lumbar region: Secondary | ICD-10-CM | POA: Diagnosis not present

## 2019-11-24 DIAGNOSIS — G8929 Other chronic pain: Secondary | ICD-10-CM | POA: Insufficient documentation

## 2019-11-24 DIAGNOSIS — Z9221 Personal history of antineoplastic chemotherapy: Secondary | ICD-10-CM | POA: Diagnosis not present

## 2019-11-24 DIAGNOSIS — M545 Low back pain: Secondary | ICD-10-CM | POA: Insufficient documentation

## 2019-11-24 DIAGNOSIS — G4700 Insomnia, unspecified: Secondary | ICD-10-CM

## 2019-11-24 DIAGNOSIS — Z760 Encounter for issue of repeat prescription: Secondary | ICD-10-CM | POA: Insufficient documentation

## 2019-11-24 MED ORDER — HYDROCODONE-ACETAMINOPHEN 10-325 MG PO TABS: 1.0000 | ORAL_TABLET | Freq: Four times a day (QID) | ORAL | 0 refills | Status: DC | PRN

## 2019-11-24 NOTE — Progress Notes (Signed)
Subjective:    Patient ID: Elizabeth Torres, female    DOB: [birth year], 44 y.o.   MRN: [id number]  HPI: Elizabeth Torres is a 44 y.o. female who returns for follow up appointment for chronic pain and medication refill. She states her pain is located in her lower back radiating into her bilateral lower extremities. She rates her pain 4. Her current exercise regime is walking and performing stretching exercises.  Elizabeth Torres Morphine equivalent is 48.33 MME.  Oral Swab was Performed on 08/18/2019, it was consistent.   Pain Inventory Average Pain 4 Pain Right Now 4 My pain is constant, dull and aching  In the last 24 hours, has pain interfered with the following? General activity 4 Relation with others 4 Enjoyment of life 4 What TIME of day is your pain at its worst? evening Sleep (in general) Fair  Pain is worse with: sitting, inactivity and standing Pain improves with: rest, heat/ice and medication Relief from Meds: 5  Mobility Do you have any goals in this area?  no  Function Do you have any goals in this area?  no  Neuro/Psych No problems in this area  Prior Studies Any changes since last visit?  no  Physicians involved in your care Any changes since last visit?  no   Family History  Problem Relation Age of Onset  . Diabetes Mother   . Hypertension Mother   . Cancer Paternal Uncle   . Breast cancer Paternal Uncle        40'S  . Breast cancer Paternal Aunt        48'S   Social History   Socioeconomic History  . Marital status: Married    Spouse name: Not on file  . Number of children: Not on file  . Years of education: Not on file  . Highest education level: Not on file  Occupational History  . Not on file  Tobacco Use  . Smoking status: Current Every Day Smoker    Packs/day: 1.00    Years: 20.00    Pack years: 20.00    Types: Cigarettes  . Smokeless tobacco: Never Used  Substance and Sexual Activity  . Alcohol use: Yes    Comment: socially  . Drug use:  No  . Sexual activity: Not on file  Other Topics Concern  . Not on file  Social History Narrative  . Not on file   Social Determinants of Health   Financial Resource Strain:   . Difficulty of Paying Living Expenses:   Food Insecurity:   . Worried About Charity fundraiser in the Last Year:   . Arboriculturist in the Last Year:   Transportation Needs:   . Film/video editor (Medical):   Marland Kitchen Lack of Transportation (Non-Medical):   Physical Activity:   . Days of Exercise per Week:   . Minutes of Exercise per Session:   Stress:   . Feeling of Stress :   Social Connections:   . Frequency of Communication with Friends and Family:   . Frequency of Social Gatherings with Friends and Family:   . Attends Religious Services:   . Active Member of Clubs or Organizations:   . Attends Archivist Meetings:   Marland Kitchen Marital Status:    Past Surgical History:  Procedure Laterality Date  . BREAST BIOPSY Right 07/2011   invasive ductal carcinoma  . 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 RAd and  chemo tx  . BREAST SURGERY    . mastectomy Right    partial with lymph node dissection  . PORT A CATH REVISION    . PORT-A-CATH REMOVAL  11-07-15   Dr Bary Castilla  . SHOULDER ARTHROSCOPY WITH ROTATOR CUFF REPAIR Left 05/18/2019   Procedure: SHOULDER ARTHROSCOPY WITH SUBACROMIAL DECOMPRESSION AND DISTAL CLAVICAL EXCISION, INTRAARTICULAR DEBRIDEMENT;  Surgeon: Lovell Sheehan, MD;  Location: [place];  Service: Orthopedics;  Laterality: Left;  . SPINE SURGERY  2008   Dr Joya Salm  . TUBAL LIGATION     Past Medical History:  Diagnosis Date  . BRCA negative   . Breast cancer ([place]) 2013   RT LUMPECTOMY with chemo and rad tx  . Degenerative disc disease, lumbar   . Personal history of chemotherapy 2013   for breast ca  . Personal history of radiation therapy 2013   F/U right breast cancer   . Radiation 2013   BREAST CA  . Status post  chemotherapy 2013   BREAST CA   BP (!) 144/96   Pulse 95   Temp 98.7 F (37.1 C)   Ht _0  (1.6 m)   Wt 165 lb 6.4 oz (75 kg)   SpO2 94%   BMI 29.30 kg/m   Opioid Risk Score:   Fall Risk Score:  `1  Depression screen PHQ 2/9  Depression screen [facility] 2/9 04/26/2019 12/15/2018 10/14/2018 09/15/2018 07/15/2018 04/20/2018 12/02/2017  Decreased Interest 0 0 0 0 0 0 0  Down, Depressed, Hopeless 0 0 0 0 0 0 0  PHQ - 2 Score 0 0 0 0 0 0 0  Altered sleeping 0 - - - - - -  Tired, decreased energy 0 - - - - - -  Change in appetite 0 - - - - - -  Feeling bad or failure about yourself  0 - - - - - -  Trouble concentrating 0 - - - - - -  Moving slowly or fidgety/restless 0 - - - - - -  Suicidal thoughts 0 - - - - - -  PHQ-9 Score 0 - - - - - -  Difficult doing work/chores - - - - - - -  Some recent data might be hidden   Review of Systems  All other systems reviewed and are negative.      Objective:   Physical Exam Vitals and nursing note reviewed.  Constitutional:      Appearance: Normal appearance.  Cardiovascular:     Rate and Rhythm: Normal rate and regular rhythm.     Pulses: Normal pulses.     Heart sounds: Normal heart sounds.  Pulmonary:     Effort: Pulmonary effort is normal.     Breath sounds: Normal breath sounds.  Musculoskeletal:     Cervical back: Normal range of motion and neck supple.     Comments: Normal Muscle Bulk and Muscle Testing Reveals:  Upper Extremities: Full ROM and Muscle Strength 5/5 Back without spinal Tenderness  Lower Extremities: Full ROM and Muscle Strength 5/5 Arises from chair with ease Narrow Based  Gait   Skin:    General: Skin is warm and dry.  Neurological:     Mental Status: She is alert and oriented to person, place, and time.  Psychiatric:        Mood and Affect: Mood normal.        Behavior: Behavior normal.           Assessment & Plan:  1. Lumbar postlaminectomy syndrome status post L5-S1 fusion with chronic S1  radiculopathy.Continuecurrent medication regimen withNortriptyline.11/24/2019. Refilled:Hydrocodone 10/325 mg #145-one every 6 hours as needed for pain, may take an extra tablet on work days only.11/24/2019. We will continue the opioid monitoring program, this consists of regular clinic visits, examinations, urine drug screen, pill counts as well as use of [place] Controlled Substance reporting System. 2. 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 Insomnia: Continuecurrent medication regimenAmbien.11/24/2019 4.Chronic Left Shoulder Pain:No complaints today: S/PLeft Shoulder Arthroscopy with Rotator Cuff Repair on 05/18/2019 by Dr. Gunnar Fusi: Ortho Following.11/24/2019 5. Muscle Spasm: Continue Flexeril: PCP Following.  F/U in 1 month.11/24/2019.  12mnutes of face to face patient care time was spent during this visit. All questions were encouraged and answered.  F/U in 1 month

## 2019-12-04 ENCOUNTER — Telehealth: Payer: Self-pay | Admitting: Family Medicine

## 2019-12-04 ENCOUNTER — Other Ambulatory Visit: Payer: Self-pay

## 2019-12-04 DIAGNOSIS — F17210 Nicotine dependence, cigarettes, uncomplicated: Secondary | ICD-10-CM

## 2019-12-04 MED ORDER — VARENICLINE TARTRATE 0.5 MG PO TABS: 0.5000 mg | ORAL_TABLET | Freq: Two times a day (BID) | ORAL | 0 refills | Status: DC

## 2019-12-04 NOTE — Telephone Encounter (Signed)
Sent in Chantix starter pack

## 2019-12-04 NOTE — Telephone Encounter (Signed)
Patient is calling to see if Dr. Ronnald Ramp can prescribe her trantix- this was discussed at the last OV on 11/24/19 Please advise Preferred Pharmacy [facility] [place] [facility]- [phone]

## 2019-12-04 NOTE — Progress Notes (Signed)
Rx for chantix sent in

## 2019-12-21 ENCOUNTER — Encounter: Payer: Self-pay | Admitting: Registered Nurse

## 2019-12-21 ENCOUNTER — Encounter: Payer: BC Managed Care – PPO | Attending: Physical Medicine and Rehabilitation | Admitting: Registered Nurse

## 2019-12-21 ENCOUNTER — Other Ambulatory Visit: Payer: Self-pay

## 2019-12-21 VITALS — BP 133/90 | HR 96 | Temp 98.7°F | Ht 63.0 in | Wt 167.0 lb

## 2019-12-21 DIAGNOSIS — Z9221 Personal history of antineoplastic chemotherapy: Secondary | ICD-10-CM | POA: Diagnosis not present

## 2019-12-21 DIAGNOSIS — Z760 Encounter for issue of repeat prescription: Secondary | ICD-10-CM | POA: Diagnosis not present

## 2019-12-21 DIAGNOSIS — G4700 Insomnia, unspecified: Secondary | ICD-10-CM

## 2019-12-21 DIAGNOSIS — G8929 Other chronic pain: Secondary | ICD-10-CM | POA: Insufficient documentation

## 2019-12-21 DIAGNOSIS — F17210 Nicotine dependence, cigarettes, uncomplicated: Secondary | ICD-10-CM | POA: Diagnosis not present

## 2019-12-21 DIAGNOSIS — Z5181 Encounter for therapeutic drug level monitoring: Secondary | ICD-10-CM | POA: Insufficient documentation

## 2019-12-21 DIAGNOSIS — M961 Postlaminectomy syndrome, not elsewhere classified: Secondary | ICD-10-CM | POA: Diagnosis not present

## 2019-12-21 DIAGNOSIS — G620 Drug-induced polyneuropathy: Secondary | ICD-10-CM | POA: Insufficient documentation

## 2019-12-21 DIAGNOSIS — T451X5S Adverse effect of antineoplastic and immunosuppressive drugs, sequela: Secondary | ICD-10-CM | POA: Insufficient documentation

## 2019-12-21 DIAGNOSIS — M5416 Radiculopathy, lumbar region: Secondary | ICD-10-CM | POA: Diagnosis not present

## 2019-12-21 DIAGNOSIS — G894 Chronic pain syndrome: Secondary | ICD-10-CM | POA: Insufficient documentation

## 2019-12-21 DIAGNOSIS — M79605 Pain in left leg: Secondary | ICD-10-CM | POA: Diagnosis not present

## 2019-12-21 DIAGNOSIS — Z79891 Long term (current) use of opiate analgesic: Secondary | ICD-10-CM | POA: Insufficient documentation

## 2019-12-21 DIAGNOSIS — M545 Low back pain: Secondary | ICD-10-CM | POA: Insufficient documentation

## 2019-12-21 DIAGNOSIS — M79604 Pain in right leg: Secondary | ICD-10-CM | POA: Insufficient documentation

## 2019-12-21 DIAGNOSIS — T451X5A Adverse effect of antineoplastic and immunosuppressive drugs, initial encounter: Secondary | ICD-10-CM

## 2019-12-21 MED ORDER — HYDROCODONE-ACETAMINOPHEN 10-325 MG PO TABS: 1.0000 | ORAL_TABLET | Freq: Four times a day (QID) | ORAL | 0 refills | Status: DC | PRN

## 2019-12-21 NOTE — Progress Notes (Signed)
Subjective:    Patient ID: Elizabeth Torres, female    DOB: 04-Apr-1976, 44 y.o.   MRN: [id number]  HPI: Elizabeth Torres is a 44 y.o. female who returns for follow up appointment for chronic pain and medication refill.  She states her pain is located in her lower back radiating into her bilateral lower extremities. She rates her pain 4. Her  current exercise regime is walking and performing stretching exercises.  Ms. Morillo Morphine equivalent is 50.00  MME.  Last Oral Swab was Performed on 08/18/2019, it was consistent.   Pain Inventory Average Pain 4 Pain Right Now 4 My pain is constant and aching  In the last 24 hours, has pain interfered with the following? General activity 4 Relation with others 4 Enjoyment of life 4 What TIME of day is your pain at its worst? evening Sleep (in general) Fair  Pain is worse with: inactivity Pain improves with: rest and medication Relief from Meds: 5  Mobility Do you have any goals in this area?  no  Function Do you have any goals in this area?  no  Neuro/Psych No problems in this area  Prior Studies Any changes since last visit?  no  Physicians involved in your care Any changes since last visit?  no   Family History  Problem Relation Age of Onset  . Diabetes Mother   . Hypertension Mother   . Cancer Paternal Uncle   . Breast cancer Paternal Uncle        40'S  . Breast cancer Paternal Aunt        11'S   Social History   Socioeconomic History  . Marital status: Married    Spouse name: Not on file  . Number of children: Not on file  . Years of education: Not on file  . Highest education level: Not on file  Occupational History  . Not on file  Tobacco Use  . Smoking status: Current Every Day Smoker    Packs/day: 1.00    Years: 20.00    Pack years: 20.00    Types: Cigarettes  . Smokeless tobacco: Never Used  Substance and Sexual Activity  . Alcohol use: Yes    Comment: socially  . Drug use: No  . Sexual activity: Not on  file  Other Topics Concern  . Not on file  Social History Narrative  . Not on file   Social Determinants of Health   Financial Resource Strain:   . Difficulty of Paying Living Expenses:   Food Insecurity:   . Worried About Charity fundraiser in the Last Year:   . Arboriculturist in the Last Year:   Transportation Needs:   . Film/video editor (Medical):   Marland Kitchen Lack of Transportation (Non-Medical):   Physical Activity:   . Days of Exercise per Week:   . Minutes of Exercise per Session:   Stress:   . Feeling of Stress :   Social Connections:   . Frequency of Communication with Friends and Family:   . Frequency of Social Gatherings with Friends and Family:   . Attends Religious Services:   . Active Member of Clubs or Organizations:   . Attends Archivist Meetings:   Marland Kitchen Marital Status:    Past Surgical History:  Procedure Laterality Date  . BREAST BIOPSY Right 07/2011   invasive ductal carcinoma  . 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 RAd and chemo  tx  . BREAST SURGERY    . mastectomy Right    partial with lymph node dissection  . PORT A CATH REVISION    . PORT-A-CATH REMOVAL  11-07-15   Dr Bary Castilla  . SHOULDER ARTHROSCOPY WITH ROTATOR CUFF REPAIR Left 05/18/2019   Procedure: SHOULDER ARTHROSCOPY WITH SUBACROMIAL DECOMPRESSION AND DISTAL CLAVICAL EXCISION, INTRAARTICULAR DEBRIDEMENT;  Surgeon: Lovell Sheehan, MD;  Location: [facility];  Service: Orthopedics;  Laterality: Left;  . SPINE SURGERY  2008   Dr Joya Salm  . TUBAL LIGATION     Past Medical History:  Diagnosis Date  . BRCA negative   . Breast cancer ([place]) 2013   RT LUMPECTOMY with chemo and rad tx  . Degenerative disc disease, lumbar   . Personal history of chemotherapy 2013   for breast ca  . Personal history of radiation therapy 2013   F/U right breast cancer   . Radiation 2013   BREAST CA  . Status post chemotherapy 2013   BREAST CA    BP 133/90   Pulse 96   Temp 98.7 F (37.1 C)   Ht _0  (1.6 m)   Wt 167 lb (75.8 kg)   SpO2 96%   BMI 29.58 kg/m   Opioid Risk Score:   Fall Risk Score:  `1  Depression screen PHQ 2/9  Depression screen [facility] 2/9 04/26/2019 12/15/2018 10/14/2018 09/15/2018 07/15/2018 04/20/2018 12/02/2017  Decreased Interest 0 0 0 0 0 0 0  Down, Depressed, Hopeless 0 0 0 0 0 0 0  PHQ - 2 Score 0 0 0 0 0 0 0  Altered sleeping 0 - - - - - -  Tired, decreased energy 0 - - - - - -  Change in appetite 0 - - - - - -  Feeling bad or failure about yourself  0 - - - - - -  Trouble concentrating 0 - - - - - -  Moving slowly or fidgety/restless 0 - - - - - -  Suicidal thoughts 0 - - - - - -  PHQ-9 Score 0 - - - - - -  Difficult doing work/chores - - - - - - -  Some recent data might be hidden    Review of Systems  All other systems reviewed and are negative.      Objective:   Physical Exam Vitals and nursing note reviewed.  Constitutional:      Appearance: Normal appearance.  Cardiovascular:     Rate and Rhythm: Normal rate and regular rhythm.     Pulses: Normal pulses.     Heart sounds: Normal heart sounds.  Pulmonary:     Effort: Pulmonary effort is normal.     Breath sounds: Normal breath sounds.  Musculoskeletal:     Cervical back: Normal range of motion and neck supple.     Comments: Normal Muscle Bulk and Muscle Testing Reveals:  Upper Extremities: Full ROM and Muscle Strength 5/5  Lumbar Paraspinal Tenderness: L-3-L-5 Lower Extremities: Full ROM and Muscle Strength 5/5 Arises from chair with ease Narrow Based  Gait   Skin:    General: Skin is warm and dry.  Neurological:     Mental Status: She is alert and oriented to person, place, and time.  Psychiatric:        Mood and Affect: Mood normal.        Behavior: Behavior normal.           Assessment & Plan:  1. Lumbar postlaminectomy  syndrome status post L5-S1 fusion with chronic S1 radiculopathy.Continuecurrent medication  regimen withNortriptyline.12/21/2019. Refilled:Hydrocodone 10/325 mg #145-one every 6 hours as needed for pain, may take an extra tablet on work days only.12/21/2019. We will continue the opioid monitoring program, this consists of regular clinic visits, examinations, urine drug screen, pill counts as well as use of [place] Controlled Substance reporting System. 2. 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 Insomnia: Continuecurrent medication regimenAmbien.12/21/2019 4.Chronic Left Shoulder Pain:No complaints today: S/PLeft Shoulder Arthroscopy with Rotator Cuff Repair on 05/18/2019 by Dr. Gunnar Fusi: Ortho Following.12/21/2019 5. Muscle Spasm: Continue Flexeril: PCP Following.12/21/2019  F/U in 1 month.11/24/2019.  61mnutes of face to face patient care time was spent during this visit. All questions were encouraged and answered.  F/U in 1 month

## 2019-12-22 ENCOUNTER — Encounter: Payer: BC Managed Care – PPO | Admitting: Registered Nurse

## 2019-12-22 DIAGNOSIS — H52203 Unspecified astigmatism, bilateral: Secondary | ICD-10-CM | POA: Diagnosis not present

## 2019-12-26 ENCOUNTER — Other Ambulatory Visit: Payer: Self-pay | Admitting: Family Medicine

## 2019-12-26 DIAGNOSIS — F17210 Nicotine dependence, cigarettes, uncomplicated: Secondary | ICD-10-CM

## 2019-12-26 NOTE — Telephone Encounter (Signed)
Requested Prescriptions  Pending Prescriptions Disp Refills  . CHANTIX 0.5 MG tablet [Pharmacy Med Name: CHANTIX 0.5 MG TABLET] 60 tablet 0    Sig: TAKE 1 TABLET BY MOUTH 2 TIMES DAILY.     Psychiatry:  Drug Dependence Therapy Passed - 12/26/2019  9:31 AM      Passed - Valid encounter within last 12 months    Recent Outpatient Visits          8 months ago Preoperative clearance   [facility] Juline Patch, MD   2 years ago Multifocal pneumonia   [facility] Juline Patch, MD

## 2019-12-27 ENCOUNTER — Other Ambulatory Visit: Payer: Self-pay | Admitting: Family Medicine

## 2019-12-27 DIAGNOSIS — F17210 Nicotine dependence, cigarettes, uncomplicated: Secondary | ICD-10-CM

## 2019-12-27 NOTE — Telephone Encounter (Signed)
Medication Refill - Medication: CHANTIX 0.5 MG tablet Continuation     Preferred Pharmacy (with phone number or street name):  [id number] - [place], [place] Phone:  [phone]  Fax:  [phone]       Agent: Please be advised that RX refills may take up to 3 business days. We ask that you follow-up with your pharmacy.

## 2020-01-02 ENCOUNTER — Other Ambulatory Visit: Payer: Self-pay

## 2020-01-02 ENCOUNTER — Telehealth: Payer: Self-pay | Admitting: Family Medicine

## 2020-01-02 DIAGNOSIS — F17210 Nicotine dependence, cigarettes, uncomplicated: Secondary | ICD-10-CM

## 2020-01-02 MED ORDER — VARENICLINE TARTRATE 1 MG PO TABS: 1.0000 mg | ORAL_TABLET | Freq: Two times a day (BID) | ORAL | 2 refills | Status: DC

## 2020-01-02 NOTE — Telephone Encounter (Unsigned)
Copied from [place] [phone]. Topic: General - Inquiry >> Jan 02, 2020  9:12 AM Mathis Bud wrote: Reason for CRM: Patient is requesting a call back from [facility] regarding medication. [phone]

## 2020-01-02 NOTE — Telephone Encounter (Signed)
phone in chantix 1mg  BID with 2 refills to [facility] [place]/ phoned in

## 2020-01-18 ENCOUNTER — Telehealth: Payer: Self-pay | Admitting: *Deleted

## 2020-01-18 ENCOUNTER — Other Ambulatory Visit: Payer: Self-pay

## 2020-01-18 ENCOUNTER — Encounter: Payer: Self-pay | Admitting: Registered Nurse

## 2020-01-18 ENCOUNTER — Encounter: Payer: BC Managed Care – PPO | Attending: Physical Medicine and Rehabilitation | Admitting: Registered Nurse

## 2020-01-18 VITALS — BP 141/89 | HR 81 | Temp 97.9°F | Ht 63.0 in | Wt 165.4 lb

## 2020-01-18 DIAGNOSIS — Z760 Encounter for issue of repeat prescription: Secondary | ICD-10-CM | POA: Insufficient documentation

## 2020-01-18 DIAGNOSIS — M545 Low back pain: Secondary | ICD-10-CM | POA: Diagnosis not present

## 2020-01-18 DIAGNOSIS — M5416 Radiculopathy, lumbar region: Secondary | ICD-10-CM

## 2020-01-18 DIAGNOSIS — M79605 Pain in left leg: Secondary | ICD-10-CM | POA: Insufficient documentation

## 2020-01-18 DIAGNOSIS — G894 Chronic pain syndrome: Secondary | ICD-10-CM | POA: Insufficient documentation

## 2020-01-18 DIAGNOSIS — Z5181 Encounter for therapeutic drug level monitoring: Secondary | ICD-10-CM | POA: Insufficient documentation

## 2020-01-18 DIAGNOSIS — G620 Drug-induced polyneuropathy: Secondary | ICD-10-CM

## 2020-01-18 DIAGNOSIS — Z79891 Long term (current) use of opiate analgesic: Secondary | ICD-10-CM | POA: Insufficient documentation

## 2020-01-18 DIAGNOSIS — M961 Postlaminectomy syndrome, not elsewhere classified: Secondary | ICD-10-CM | POA: Diagnosis not present

## 2020-01-18 DIAGNOSIS — F17210 Nicotine dependence, cigarettes, uncomplicated: Secondary | ICD-10-CM | POA: Diagnosis not present

## 2020-01-18 DIAGNOSIS — M79604 Pain in right leg: Secondary | ICD-10-CM | POA: Diagnosis not present

## 2020-01-18 DIAGNOSIS — T451X5A Adverse effect of antineoplastic and immunosuppressive drugs, initial encounter: Secondary | ICD-10-CM

## 2020-01-18 DIAGNOSIS — G4700 Insomnia, unspecified: Secondary | ICD-10-CM

## 2020-01-18 DIAGNOSIS — M62830 Muscle spasm of back: Secondary | ICD-10-CM

## 2020-01-18 DIAGNOSIS — Z9221 Personal history of antineoplastic chemotherapy: Secondary | ICD-10-CM | POA: Diagnosis not present

## 2020-01-18 DIAGNOSIS — G8929 Other chronic pain: Secondary | ICD-10-CM | POA: Insufficient documentation

## 2020-01-18 DIAGNOSIS — T451X5S Adverse effect of antineoplastic and immunosuppressive drugs, sequela: Secondary | ICD-10-CM | POA: Insufficient documentation

## 2020-01-18 MED ORDER — HYDROCODONE-ACETAMINOPHEN 10-325 MG PO TABS: 1.0000 | ORAL_TABLET | Freq: Four times a day (QID) | ORAL | 0 refills | Status: DC | PRN

## 2020-01-18 MED ORDER — METHOCARBAMOL 500 MG PO TABS: 500.0000 mg | ORAL_TABLET | Freq: Two times a day (BID) | ORAL | 1 refills | Status: DC | PRN

## 2020-01-18 NOTE — Telephone Encounter (Signed)
Call returned to patient and advised to keep appointment for next week for evaluation at that time. She thanked me for calling

## 2020-01-18 NOTE — Telephone Encounter (Signed)
I would just keep visit with Dr. Grayland Ormond. He will evaluate next week.   Faythe Casa, NP 01/18/2020 11:35 AM

## 2020-01-18 NOTE — Progress Notes (Signed)
Subjective:    Patient ID: Elizabeth Torres, female    DOB: 1976/03/01, 44 y.o.   MRN: [id number]  HPI: Elizabeth Torres is a 44 y.o. female who returns for follow up appointment for chronic pain and medication refill. She states her pain is located in her lower back radiating into her bilateral lower extremities. Also reports increase intensity and frequency of muscle spasms, Robaxin prescribed, she verbalized understanding.  Also reports Left forearm lump, she has a  Scheduled appointment with her oncologist. She  rates her pain 5. Her current exercise regime is walking and performing stretching exercises.  [name] Morphine equivalent is 50.00 MME.  Oral Swab was Performed Today.   Pain Inventory Average Pain 5 Pain Right Now 5 My pain is constant, sharp and aching  In the last 24 hours, has pain interfered with the following? General activity 8 Relation with others 8 Enjoyment of life 8 What TIME of day is your pain at its worst? evening Sleep (in general) Fair  Pain is worse with: bending and inactivity Pain improves with: rest, heat/ice and medication Relief from Meds: 5  Mobility Do you have any goals in this area?  no  Function Do you have any goals in this area?  no  Neuro/Psych No problems in this area  Prior Studies Any changes since last visit?  no  Physicians involved in your care Any changes since last visit?  no   Family History  Problem Relation Age of Onset  . Diabetes Mother   . Hypertension Mother   . Cancer Paternal Uncle   . Breast cancer Paternal Uncle        40'S  . Breast cancer Paternal Aunt        28'S   Social History   Socioeconomic History  . Marital status: Married    Spouse name: Not on file  . Number of children: Not on file  . Years of education: Not on file  . Highest education level: Not on file  Occupational History  . Not on file  Tobacco Use  . Smoking status: Current Every Day Smoker    Packs/day: 1.00    Years: 20.00      Pack years: 20.00    Types: Cigarettes  . Smokeless tobacco: Never Used  Vaping Use  . Vaping Use: Never used  Substance and Sexual Activity  . Alcohol use: Yes    Comment: socially  . Drug use: No  . Sexual activity: Not on file  Other Topics Concern  . Not on file  Social History Narrative  . Not on file   Social Determinants of Health   Financial Resource Strain:   . Difficulty of Paying Living Expenses:   Food Insecurity:   . Worried About Charity fundraiser in the Last Year:   . Arboriculturist in the Last Year:   Transportation Needs:   . Film/video editor (Medical):   Marland Kitchen Lack of Transportation (Non-Medical):   Physical Activity:   . Days of Exercise per Week:   . Minutes of Exercise per Session:   Stress:   . Feeling of Stress :   Social Connections:   . Frequency of Communication with Friends and Family:   . Frequency of Social Gatherings with Friends and Family:   . Attends Religious Services:   . Active Member of Clubs or Organizations:   . Attends Archivist Meetings:   Marland Kitchen Marital Status:  Past Surgical History:  Procedure Laterality Date  . BREAST BIOPSY Right 07/2011   invasive ductal carcinoma  . 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 RAd and chemo tx  . BREAST SURGERY    . mastectomy Right    partial with lymph node dissection  . PORT A CATH REVISION    . PORT-A-CATH REMOVAL  11-07-15   Dr Bary Castilla  . SHOULDER ARTHROSCOPY WITH ROTATOR CUFF REPAIR Left 05/18/2019   Procedure: SHOULDER ARTHROSCOPY WITH SUBACROMIAL DECOMPRESSION AND DISTAL CLAVICAL EXCISION, INTRAARTICULAR DEBRIDEMENT;  Surgeon: Lovell Sheehan, MD;  Location: [facility];  Service: Orthopedics;  Laterality: Left;  . SPINE SURGERY  2008   Dr Joya Salm  . TUBAL LIGATION     Past Medical History:  Diagnosis Date  . BRCA negative   . Breast cancer ([place]) 2013   RT LUMPECTOMY with chemo and rad tx  . Degenerative  disc disease, lumbar   . Personal history of chemotherapy 2013   for breast ca  . Personal history of radiation therapy 2013   F/U right breast cancer   . Radiation 2013   BREAST CA  . Status post chemotherapy 2013   BREAST CA   There were no vitals taken for this visit.  Opioid Risk Score:   Fall Risk Score:  `1  Depression screen PHQ 2/9  Depression screen [facility] 2/9 01/18/2020 04/26/2019 12/15/2018 10/14/2018 09/15/2018 07/15/2018 04/20/2018  Decreased Interest 0 0 0 0 0 0 0  Down, Depressed, Hopeless 0 0 0 0 0 0 0  PHQ - 2 Score 0 0 0 0 0 0 0  Altered sleeping - 0 - - - - -  Tired, decreased energy - 0 - - - - -  Change in appetite - 0 - - - - -  Feeling bad or failure about yourself  - 0 - - - - -  Trouble concentrating - 0 - - - - -  Moving slowly or fidgety/restless - 0 - - - - -  Suicidal thoughts - 0 - - - - -  PHQ-9 Score - 0 - - - - -  Difficult doing work/chores - - - - - - -  Some recent data might be hidden    Review of Systems  Constitutional: Negative.   HENT: Negative.   Eyes: Negative.   Respiratory: Negative.   Cardiovascular: Negative.   Gastrointestinal: Negative.   Endocrine: Negative.   Genitourinary: Negative.   Musculoskeletal: Positive for back pain.  Skin: Negative.   Allergic/Immunologic: Negative.   Neurological: Negative.   Hematological: Negative.   Psychiatric/Behavioral: Negative.   All other systems reviewed and are negative.      Objective:   Physical Exam Vitals and nursing note reviewed.  Constitutional:      Appearance: Normal appearance.  Cardiovascular:     Rate and Rhythm: Normal rate and regular rhythm.     Pulses: Normal pulses.     Heart sounds: Normal heart sounds.  Pulmonary:     Effort: Pulmonary effort is normal.     Breath sounds: Normal breath sounds.  Musculoskeletal:     Cervical back: Normal range of motion and neck supple.     Comments: Normal Muscle Bulk and Muscle Testing Reveals:  Upper Extremities:  Full ROM and Muscle Strength 5/5  Lumbar Paraspinal Tenderness: L-4-L-5 Lower Extremities: Full ROM and Muscle Strength 5/5 Arises from Table with ease Narrow Based Gait   Skin:  General: Skin is warm and dry.  Neurological:     Mental Status: She is alert and oriented to person, place, and time.  Psychiatric:        Mood and Affect: Mood normal.        Behavior: Behavior normal.           Assessment & Plan:  1. Lumbar postlaminectomy syndrome status post L5-S1 fusion with chronic S1 radiculopathy.Continuecurrent medication regimen withNortriptyline.01/18/2020. Refilled:Hydrocodone 10/325 mg #145-one every 6 hours as needed for pain, may take an extra tablet on work days only.01/18/2020. We will continue the opioid monitoring program, this consists of regular clinic visits, examinations, urine drug screen, pill counts as well as use of [place] Controlled Substance reporting System. 2. 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 Insomnia: Continuecurrent medication regimenAmbien.01/18/2020 4.Chronic Left Shoulder Pain:No complaints today: S/PLeft Shoulder Arthroscopy with Rotator Cuff Repair on 05/18/2019 by Dr. Gunnar Fusi: Ortho Following.01/19/2020 5. Muscle Spasm: RX: Robaxin05/20/2021  F/U in 1 month.  64mnutes of face to face patient care time was spent during this visit. All questions were encouraged and answered.  F/U in 1 month

## 2020-01-18 NOTE — Telephone Encounter (Signed)
Patient called reporting that she went to see another provider this morning and it was suggested that she contact our office regarding a lump in her left arm and her "arm feeling like it is going to sleep". She also states that that provider wanted her potassium and Magnesium levels checked and she is asking if we would be the one to do that or if she needs to contact her PCP. She is also asking if she needs to be seen before her appointment next week 01/23/20 for her annual visit. Please advise

## 2020-01-18 NOTE — Progress Notes (Signed)
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   INTERVAL HISTORY: Patient returns to clinic today for routine yearly evaluation.  She has noticed a lump on her left upper extremity that is nontender.  She otherwise feels well and is asymptomatic. She has no neurologic complaints.  She denies any recent fevers or illnesses.  She has a good appetite and denies weight loss.  She denies any chest pain, shortness of breath, cough, or hemoptysis.  She has no nausea, vomiting, constipation, or diarrhea.  She has no urinary complaints.  Patient offers no further specific complaints today.  REVIEW OF SYSTEMS:   Review of Systems  Constitutional: Negative.  Negative for fever, malaise/fatigue and weight loss.  Respiratory: Negative.  Negative for cough and shortness of breath.   Cardiovascular: Negative.  Negative for chest pain and leg swelling.  Gastrointestinal: Negative.  Negative for abdominal pain.  Genitourinary: Negative.  Negative for dysuria.  Musculoskeletal: Negative.  Negative for back pain.  Skin: Negative.  Negative for rash.  Neurological: Negative.  Negative for dizziness, sensory change, focal weakness, weakness and headaches.  Psychiatric/Behavioral: Negative.  The patient is not nervous/anxious.     As per HPI. Otherwise, a complete review of systems is negative.  PAST MEDICAL HISTORY: Past Medical History:  Diagnosis Date  . BRCA negative   . Breast cancer ([place]) 2013   RT LUMPECTOMY with chemo and rad tx  . Degenerative disc disease, lumbar   . Personal history of chemotherapy 2013   for  breast ca  . Personal history of radiation therapy 2013   F/U right breast cancer   . Radiation 2013   BREAST CA  . Status post chemotherapy 2013   BREAST CA    PAST SURGICAL HISTORY: Past Surgical History:  Procedure Laterality Date  . BREAST BIOPSY Right 07/2011   invasive ductal carcinoma  . 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 RAd and chemo tx  . BREAST SURGERY    . mastectomy Right    partial with lymph node dissection  . PORT A CATH REVISION    . PORT-A-CATH REMOVAL  11-07-15   Dr Bary Castilla  . SHOULDER ARTHROSCOPY WITH ROTATOR CUFF REPAIR Left 05/18/2019   Procedure: SHOULDER ARTHROSCOPY WITH SUBACROMIAL DECOMPRESSION AND DISTAL CLAVICAL EXCISION, INTRAARTICULAR DEBRIDEMENT;  Surgeon: Lovell Sheehan, MD;  Location: [facility];  Service: Orthopedics;  Laterality: Left;  . SPINE SURGERY  2008   Dr Joya Salm  . TUBAL LIGATION      FAMILY HISTORY Family History  Problem Relation Age of Onset  . Diabetes Mother   . Hypertension Mother   . Cancer Paternal Uncle   . Breast cancer Paternal Uncle        40'S  . Breast cancer Paternal Aunt        62'S       ADVANCED DIRECTIVES:    HEALTH MAINTENANCE: Social History   Tobacco Use  . Smoking status: Current Every Day Smoker    Packs/day: 1.00    Years: 20.00    Pack years: 20.00    Types: Cigarettes  . Smokeless tobacco: Never  Used  Vaping Use  . Vaping Use: Never used  Substance Use Topics  . Alcohol use: Yes    Comment: socially  . Drug use: No     Colonoscopy:  PAP:  Bone density:  Lipid panel:  Allergies  Allergen Reactions  . Penicillins Anaphylaxis  . Sulfa Antibiotics Anaphylaxis  . Ondansetron     migraines  . Zofran [Ondansetron Hcl]     migraines    Current Outpatient Medications  Medication Sig Dispense Refill  . HYDROcodone-acetaminophen (NORCO) 10-325 MG tablet Take 1 tablet by mouth every 6 (six) hours as needed. May take an  extra tablet on work days-no more than 5 per day prn 145 tablet 0  . ibuprofen (ADVIL,MOTRIN) 800 MG tablet Take 800 mg by mouth every 6 (six) hours as needed (pain).     . methocarbamol (ROBAXIN) 500 MG tablet Take 1 tablet (500 mg total) by mouth 2 (two) times daily as needed for muscle spasms. 60 tablet 1  . nortriptyline (PAMELOR) 10 MG capsule TAKE 2 CAPSULES BY MOUTH EVERY DAY AT BEDTIME 180 capsule 3  . varenicline (CHANTIX) 1 MG tablet Take 1 tablet (1 mg total) by mouth 2 (two) times daily. 56 tablet 2  . zolpidem (AMBIEN) 5 MG tablet TAKE 1 TABLET BY MOUTH EVERY DAY AT BEDTIME AS NEEDED FOR INSOMNIA 30 tablet 3   No current facility-administered medications for this visit.    OBJECTIVE: Vitals:   01/23/20 1440  BP: (!) 152/103  Pulse: 87  Resp: 20  Temp: 98 F (36.7 C)     Body mass index is 29.16 kg/m.    ECOG FS:0 - Asymptomatic  General: Well-developed, well-nourished, no acute distress. Eyes: Pink conjunctiva, anicteric sclera. HEENT: Normocephalic, moist mucous membranes. Breast: Bilateral breast and axilla without lumps or masses. Lungs: No audible wheezing or coughing. Heart: Regular rate and rhythm. Abdomen: Soft, nontender, no obvious distention. Musculoskeletal: No edema, cyanosis, or clubbing.  1 to 2 cm nontender nodule palpated in left upper extremity consistent with lipoma. Neuro: Alert, answering all questions appropriately. Cranial nerves grossly intact. Skin: No rashes or petechiae noted. Psych: Normal affect.    LAB RESULTS:  Lab Results  Component Value Date   NA 138 10/13/2017   K 4.2 10/13/2017   CL 102 10/13/2017   CO2 26 10/13/2017   GLUCOSE 152 (H) 10/13/2017   BUN 15 10/13/2017   CREATININE 0.67 10/13/2017   CALCIUM 9.9 10/13/2017   PROT 8.1 10/13/2017   ALBUMIN 4.3 10/13/2017   AST 26 10/13/2017   ALT 35 10/13/2017   ALKPHOS 70 10/13/2017   BILITOT 0.5 10/13/2017   GFRNONAA >60 10/13/2017   GFRAA >60 10/13/2017    Lab  Results  Component Value Date   WBC 10.7 10/13/2017   NEUTROABS 9.7 (H) 10/09/2017   HGB 14.6 10/13/2017   HCT 42.1 10/13/2017   MCV 93.5 10/13/2017   PLT 334 10/13/2017     STUDIES: No results found.  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   PLAN:    1. 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  Patient completed chemotherapy with AC-Taxol in July 2013. She completed adjuvant XRT in October 2013.  Her most recent mammogram on April 19, 2019 was reported as BI-RADS 2.  Repeat in September 2021.  Her most recent CA 27-29 in March 2019 was within normal limits at  36.9.  She does not require breast MRI at this time. She does not require tamoxifen given the ER/PR status of her tumor.  Return to clinic in 1 year for routine evaluation. 2.  Left arm nodule: Likely a benign lipoma. Will get ultrasound to confirm.   Patient expressed understanding and was in agreement with this plan. She also understands that She can call clinic at any time with any questions, concerns, or complaints.   Lloyd Huger, MD   01/24/2020 3:46 PM

## 2020-01-22 LAB — DRUG TOX MONITOR 1 W/CONF, ORAL FLD
Amphetamines: NEGATIVE ng/mL (ref <10)
Barbiturates: NEGATIVE ng/mL (ref <10)
Benzodiazepines: NEGATIVE ng/mL (ref <0.50)
Buprenorphine: NEGATIVE ng/mL (ref <0.10)
Cocaine: NEGATIVE ng/mL (ref <5.0)
Codeine: NEGATIVE ng/mL (ref <2.5)
Dihydrocodeine: 12.4 ng/mL — ABNORMAL HIGH (ref <2.5)
Fentanyl: NEGATIVE ng/mL (ref <0.10)
Heroin Metabolite: NEGATIVE ng/mL (ref <1.0)
Hydrocodone: 199.4 ng/mL — ABNORMAL HIGH (ref <2.5)
Hydromorphone: NEGATIVE ng/mL (ref <2.5)
MARIJUANA: NEGATIVE ng/mL (ref <2.5)
MDMA: NEGATIVE ng/mL (ref <10)
Meprobamate: NEGATIVE ng/mL (ref <2.5)
Methadone: NEGATIVE ng/mL (ref <5.0)
Morphine: NEGATIVE ng/mL (ref <2.5)
Nicotine Metabolite: NEGATIVE ng/mL (ref <5.0)
Norhydrocodone: 21.7 ng/mL — ABNORMAL HIGH (ref <2.5)
Noroxycodone: NEGATIVE ng/mL (ref <2.5)
Opiates: POSITIVE ng/mL — AB (ref <2.5)
Oxycodone: NEGATIVE ng/mL (ref <2.5)
Oxymorphone: NEGATIVE ng/mL (ref <2.5)
Phencyclidine: NEGATIVE ng/mL (ref <10)
Tapentadol: NEGATIVE ng/mL (ref <5.0)
Tramadol: NEGATIVE ng/mL (ref <5.0)
Zolpidem: NEGATIVE ng/mL (ref <5.0)

## 2020-01-22 LAB — DRUG TOX ALC METAB W/CON, ORAL FLD: Alcohol Metabolite: NEGATIVE ng/mL (ref <25)

## 2020-01-23 ENCOUNTER — Inpatient Hospital Stay: Payer: BC Managed Care – PPO | Attending: Oncology | Admitting: Oncology

## 2020-01-23 ENCOUNTER — Encounter: Payer: Self-pay | Admitting: Oncology

## 2020-01-23 ENCOUNTER — Other Ambulatory Visit: Payer: Self-pay

## 2020-01-23 VITALS — BP 152/103 | HR 87 | Temp 98.0°F | Resp 20 | Wt 164.6 lb

## 2020-01-23 DIAGNOSIS — Z853 Personal history of malignant neoplasm of breast: Secondary | ICD-10-CM | POA: Insufficient documentation

## 2020-01-23 DIAGNOSIS — Z923 Personal history of irradiation: Secondary | ICD-10-CM | POA: Diagnosis not present

## 2020-01-23 DIAGNOSIS — R2232 Localized swelling, mass and lump, left upper limb: Secondary | ICD-10-CM | POA: Insufficient documentation

## 2020-01-23 DIAGNOSIS — Z803 Family history of malignant neoplasm of breast: Secondary | ICD-10-CM | POA: Diagnosis not present

## 2020-01-23 DIAGNOSIS — C50511 Malignant neoplasm of lower-outer quadrant of right female breast: Secondary | ICD-10-CM

## 2020-01-23 DIAGNOSIS — Z9221 Personal history of antineoplastic chemotherapy: Secondary | ICD-10-CM | POA: Diagnosis not present

## 2020-01-23 DIAGNOSIS — F17210 Nicotine dependence, cigarettes, uncomplicated: Secondary | ICD-10-CM | POA: Insufficient documentation

## 2020-01-23 NOTE — Progress Notes (Signed)
Patient here today for follow up regarding breast cancer. Patient reports that she has a new lump in her left upper arm, noticed this last week.

## 2020-01-26 ENCOUNTER — Ambulatory Visit (INDEPENDENT_AMBULATORY_CARE_PROVIDER_SITE_OTHER): Payer: BC Managed Care – PPO | Admitting: Family Medicine

## 2020-01-26 ENCOUNTER — Encounter: Payer: Self-pay | Admitting: Family Medicine

## 2020-01-26 ENCOUNTER — Other Ambulatory Visit: Payer: Self-pay

## 2020-01-26 VITALS — BP 140/96 | HR 99 | Ht 63.0 in | Wt 164.0 lb

## 2020-01-26 DIAGNOSIS — Z853 Personal history of malignant neoplasm of breast: Secondary | ICD-10-CM

## 2020-01-26 DIAGNOSIS — E663 Overweight: Secondary | ICD-10-CM | POA: Diagnosis not present

## 2020-01-26 DIAGNOSIS — I10 Essential (primary) hypertension: Secondary | ICD-10-CM

## 2020-01-26 DIAGNOSIS — R69 Illness, unspecified: Secondary | ICD-10-CM | POA: Diagnosis not present

## 2020-01-26 DIAGNOSIS — Z789 Other specified health status: Secondary | ICD-10-CM | POA: Diagnosis not present

## 2020-01-26 MED ORDER — HYDROCHLOROTHIAZIDE 12.5 MG PO TABS: 12.5000 mg | ORAL_TABLET | Freq: Every day | ORAL | 3 refills | Status: DC

## 2020-01-26 NOTE — Progress Notes (Signed)
Date:  01/26/2020   Name:  Elizabeth Torres   DOB:  1975/09/30   MRN:  [id number]   Chief Complaint: Hypertension (follow up, mom end stages of pulmonary fibrosis, has a little scaring on lungs from past pneumonia and she worried about it, bp has been trending high for 6 months,pain clinic mentioned full panel blood work since she hasnt had it in a while. )  Hypertension This is a new problem. The current episode started more than 1 month ago. The problem has been gradually worsening since onset. The problem is uncontrolled. Pertinent negatives include no anxiety, blurred vision, chest pain, headaches, malaise/fatigue, neck pain, orthopnea, palpitations, peripheral edema, PND, shortness of breath or sweats. There are no associated agents to hypertension. Risk factors for coronary artery disease include smoking/tobacco exposure. Past treatments include nothing. There are no compliance problems.  There is no history of angina, kidney disease, CAD/MI, CVA, heart failure, left ventricular hypertrophy, PVD or retinopathy. There is no history of chronic renal disease, a hypertension causing med or renovascular disease.    Lab Results  Component Value Date   CREATININE 0.67 10/13/2017   BUN 15 10/13/2017   NA 138 10/13/2017   K 4.2 10/13/2017   CL 102 10/13/2017   CO2 26 10/13/2017   Lab Results  Component Value Date   CHOL 172 05/28/2014   HDL 67 05/28/2014   LDLCALC 90 05/28/2014   TRIG 75 05/28/2014   No results found for: TSH No results found for: HGBA1C Lab Results  Component Value Date   WBC 10.7 10/13/2017   HGB 14.6 10/13/2017   HCT 42.1 10/13/2017   MCV 93.5 10/13/2017   PLT 334 10/13/2017   Lab Results  Component Value Date   ALT 35 10/13/2017   AST 26 10/13/2017   ALKPHOS 70 10/13/2017   BILITOT 0.5 10/13/2017     Review of Systems  Constitutional: Negative for chills, fever and malaise/fatigue.  HENT: Negative for drooling, ear discharge, ear pain and sore throat.    Eyes: Negative for blurred vision.  Respiratory: Negative for cough, shortness of breath and wheezing.   Cardiovascular: Negative for chest pain, palpitations, orthopnea, leg swelling and PND.  Gastrointestinal: Negative for abdominal pain, blood in stool, constipation, diarrhea and nausea.  Endocrine: Negative for polydipsia.  Genitourinary: Negative for dysuria, frequency, hematuria and urgency.  Musculoskeletal: Negative for back pain, myalgias and neck pain.       Lipoma to be evaluated  Skin: Negative for rash.  Allergic/Immunologic: Negative for environmental allergies.  Neurological: Negative for dizziness and headaches.  Hematological: Does not bruise/bleed easily.  Psychiatric/Behavioral: Negative for suicidal ideas. The patient is not nervous/anxious.     Patient Active Problem List   Diagnosis Date Noted  . Cervical myofascial pain syndrome 02/14/2016  . History of breast cancer 11/07/2015  . Postlaminectomy syndrome, lumbar region 04/12/2012  . Chemotherapy-induced neuropathy ([place]) 04/12/2012  . 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 ([place]) 08/07/2011    Allergies  Allergen Reactions  . Penicillins Anaphylaxis  . Sulfa Antibiotics Anaphylaxis  . Ondansetron     migraines  . Zofran [Ondansetron Hcl]     migraines    Past Surgical History:  Procedure Laterality Date  . BREAST BIOPSY Right 07/2011   invasive ductal carcinoma  . 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 RAd and chemo tx  . BREAST SURGERY    . mastectomy Right  partial with lymph node dissection  . PORT A CATH REVISION    . PORT-A-CATH REMOVAL  11-07-15   Dr Bary Castilla  . SHOULDER ARTHROSCOPY WITH ROTATOR CUFF REPAIR Left 05/18/2019   Procedure: SHOULDER ARTHROSCOPY WITH SUBACROMIAL DECOMPRESSION AND DISTAL CLAVICAL EXCISION, INTRAARTICULAR DEBRIDEMENT;  Surgeon: Lovell Sheehan, MD;  Location: [facility];  Service:  Orthopedics;  Laterality: Left;  . SPINE SURGERY  2008   Dr Joya Salm  . TUBAL LIGATION      Social History   Tobacco Use  . Smoking status: Former Smoker    Packs/day: 1.00    Years: 20.00    Pack years: 20.00    Types: Cigarettes    Quit date: 01/08/2020    Years since quitting: 0.0  . Smokeless tobacco: Never Used  Vaping Use  . Vaping Use: Never used  Substance Use Topics  . Alcohol use: Yes    Comment: socially  . Drug use: No     Medication list has been reviewed and updated.  Current Meds  Medication Sig  . HYDROcodone-acetaminophen (NORCO) 10-325 MG tablet Take 1 tablet by mouth every 6 (six) hours as needed. May take an extra tablet on work days-no more than 5 per day prn  . ibuprofen (ADVIL,MOTRIN) 800 MG tablet Take 800 mg by mouth every 6 (six) hours as needed (pain).   . methocarbamol (ROBAXIN) 500 MG tablet Take 1 tablet (500 mg total) by mouth 2 (two) times daily as needed for muscle spasms.  . nortriptyline (PAMELOR) 10 MG capsule TAKE 2 CAPSULES BY MOUTH EVERY DAY AT BEDTIME  . varenicline (CHANTIX) 1 MG tablet Take 1 tablet (1 mg total) by mouth 2 (two) times daily.  Marland Kitchen zolpidem (AMBIEN) 5 MG tablet TAKE 1 TABLET BY MOUTH EVERY DAY AT BEDTIME AS NEEDED FOR INSOMNIA    PHQ 2/9 Scores 01/26/2020 01/18/2020 04/26/2019 12/15/2018  PHQ - 2 Score 0 0 0 0  PHQ- 9 Score 0 - 0 -    GAD 7 : Generalized Anxiety Score 01/26/2020  Nervous, Anxious, on Edge 0  Control/stop worrying 0  Worry too much - different things 0  Trouble relaxing 0  Restless 0  Easily annoyed or irritable 0  Afraid - awful might happen 0  Total GAD 7 Score 0  Anxiety Difficulty Not difficult at all    BP Readings from Last 3 Encounters:  01/26/20 (!) 140/96  01/23/20 (!) 152/103  01/18/20 (!) 141/89    Physical Exam Vitals and nursing note reviewed.  Constitutional:      Appearance: She is well-developed.  HENT:     Head: Normocephalic.     Right Ear: External ear normal.     Left  Ear: External ear normal.  Eyes:     General: Lids are everted, no foreign bodies appreciated. No scleral icterus.       Left eye: No foreign body or hordeolum.     Conjunctiva/sclera: Conjunctivae normal.     Right eye: Right conjunctiva is not injected.     Left eye: Left conjunctiva is not injected.     Pupils: Pupils are equal, round, and reactive to light.  Neck:     Thyroid: No thyromegaly.     Vascular: No JVD.     Trachea: No tracheal deviation.  Cardiovascular:     Rate and Rhythm: Normal rate and regular rhythm.     Heart sounds: Normal heart sounds. No murmur heard.  No friction rub. No gallop.   Pulmonary:  Effort: Pulmonary effort is normal. No respiratory distress.     Breath sounds: Normal breath sounds. No wheezing or rales.  Abdominal:     General: Bowel sounds are normal.     Palpations: Abdomen is soft. There is no mass.     Tenderness: There is no abdominal tenderness. There is no guarding or rebound.  Musculoskeletal:        General: No tenderness. Normal range of motion.     Cervical back: Normal range of motion and neck supple.  Lymphadenopathy:     Cervical: No cervical adenopathy.  Skin:    General: Skin is warm.     Findings: No rash.  Neurological:     Mental Status: She is alert and oriented to person, place, and time.     Cranial Nerves: No cranial nerve deficit.     Deep Tendon Reflexes: Reflexes normal.  Psychiatric:        Mood and Affect: Mood is not anxious or depressed.     Wt Readings from Last 3 Encounters:  01/26/20 164 lb (74.4 kg)  01/23/20 164 lb 9.6 oz (74.7 kg)  01/18/20 165 lb 6.4 oz (75 kg)    BP (!) 140/96   Pulse 99   Ht 5\' 3"  (1.6 m)   Wt 164 lb (74.4 kg)   SpO2 98%   BMI 29.05 kg/m   Assessment and Plan: 1. Essential hypertension Chronic.  Controlled.  Stable.  Continue hydrochlorothiazide 12.5 mg daily. - hydrochlorothiazide (HYDRODIURIL) 12.5 MG tablet; Take 1 tablet (12.5 mg total) by mouth daily.   Dispense: 90 tablet; Refill: 3  2. History of breast cancer Discussed with patient and patient has been scheduled for mammogram April 05, 2020.  3. Overweight (BMI 25.0-29.9) Health risks of being over weight were discussed and patient was counseled on weight loss options and exercise. - Lipid Panel With LDL/HDL Ratio  4. Taking medication for chronic disease Patient is on medication for blood pressure for which we will check patient's electrolytes and GFR. - Comprehensive metabolic panel - CBC with Differential/Platelet  5. Under care of pain management specialist Patient under care for pain management and will continue with current regimen. - Comprehensive metabolic panel - Lipid Panel With LDL/HDL Ratio - CBC with Differential/Platelet  Patient did question whether or not there is a genetic link to interstitial lung disease.  Her mother is having this issue I told her that there on review of chest x-ray of this year that there was no evidence of that however this is best diagnosed by pulmonary function and if necessary I will be glad to refer on her designation.

## 2020-01-26 NOTE — Patient Instructions (Signed)
DASH Eating Plan DASH stands for "Dietary Approaches to Stop Hypertension." The DASH eating plan is a healthy eating plan that has been shown to reduce high blood pressure (hypertension). It may also reduce your risk for type 2 diabetes, heart disease, and stroke. The DASH eating plan may also help with weight loss. What are tips for following this plan?  General guidelines  Avoid eating more than 2,300 mg (milligrams) of salt (sodium) a day. If you have hypertension, you may need to reduce your sodium intake to 1,500 mg a day.  Limit alcohol intake to no more than 1 drink a day for nonpregnant women and 2 drinks a day for men. One drink equals 12 oz of beer, 5 oz of wine, or 1 oz of hard liquor.  Work with your health care provider to maintain a healthy body weight or to lose weight. Ask what an ideal weight is for you.  Get at least 30 minutes of exercise that causes your heart to beat faster (aerobic exercise) most days of the week. Activities may include walking, swimming, or biking.  Work with your health care provider or diet and nutrition specialist (dietitian) to adjust your eating plan to your individual calorie needs. Reading food labels   Check food labels for the amount of sodium per serving. Choose foods with less than 5 percent of the Daily Value of sodium. Generally, foods with less than 300 mg of sodium per serving fit into this eating plan.  To find whole grains, look for the word "whole" as the first word in the ingredient list. Shopping  Buy products labeled as "low-sodium" or "no salt added."  Buy fresh foods. Avoid canned foods and premade or frozen meals. Cooking  Avoid adding salt when cooking. Use salt-free seasonings or herbs instead of table salt or sea salt. Check with your health care provider or pharmacist before using salt substitutes.  Do not fry foods. Cook foods using healthy methods such as baking, boiling, grilling, and broiling instead.  Cook with  heart-healthy oils, such as olive, canola, soybean, or sunflower oil. Meal planning  Eat a balanced diet that includes: ? 5 or more servings of fruits and vegetables each day. At each meal, try to fill half of your plate with fruits and vegetables. ? Up to 6-8 servings of whole grains each day. ? Less than 6 oz of lean meat, poultry, or fish each day. A 3-oz serving of meat is about the same size as a deck of cards. One egg equals 1 oz. ? 2 servings of low-fat dairy each day. ? A serving of nuts, seeds, or beans 5 times each week. ? Heart-healthy fats. Healthy fats called Omega-3 fatty acids are found in foods such as flaxseeds and coldwater fish, like sardines, salmon, and mackerel.  Limit how much you eat of the following: ? Canned or prepackaged foods. ? Food that is high in trans fat, such as fried foods. ? Food that is high in saturated fat, such as fatty meat. ? Sweets, desserts, sugary drinks, and other foods with added sugar. ? Full-fat dairy products.  Do not salt foods before eating.  Try to eat at least 2 vegetarian meals each week.  Eat more home-cooked food and less restaurant, buffet, and fast food.  When eating at a restaurant, ask that your food be prepared with less salt or no salt, if possible. What foods are recommended? The items listed may not be a complete list. Talk with your dietitian about   what dietary choices are best for you. Grains Whole-grain or whole-wheat bread. Whole-grain or whole-wheat pasta. Brown rice. Oatmeal. Quinoa. Bulgur. Whole-grain and low-sodium cereals. Pita bread. Low-fat, low-sodium crackers. Whole-wheat flour tortillas. Vegetables Fresh or frozen vegetables (raw, steamed, roasted, or grilled). Low-sodium or reduced-sodium tomato and vegetable juice. Low-sodium or reduced-sodium tomato sauce and tomato paste. Low-sodium or reduced-sodium canned vegetables. Fruits All fresh, dried, or frozen fruit. Canned fruit in natural juice (without  added sugar). Meat and other protein foods Skinless chicken or turkey. Ground chicken or turkey. Pork with fat trimmed off. Fish and seafood. Egg whites. Dried beans, peas, or lentils. Unsalted nuts, nut butters, and seeds. Unsalted canned beans. Lean cuts of beef with fat trimmed off. Low-sodium, lean deli meat. Dairy Low-fat (1%) or fat-free (skim) milk. Fat-free, low-fat, or reduced-fat cheeses. Nonfat, low-sodium ricotta or cottage cheese. Low-fat or nonfat yogurt. Low-fat, low-sodium cheese. Fats and oils Soft margarine without trans fats. Vegetable oil. Low-fat, reduced-fat, or light mayonnaise and salad dressings (reduced-sodium). Canola, safflower, olive, soybean, and sunflower oils. Avocado. Seasoning and other foods Herbs. Spices. Seasoning mixes without salt. Unsalted popcorn and pretzels. Fat-free sweets. What foods are not recommended? The items listed may not be a complete list. Talk with your dietitian about what dietary choices are best for you. Grains Baked goods made with fat, such as croissants, muffins, or some breads. Dry pasta or rice meal packs. Vegetables Creamed or fried vegetables. Vegetables in a cheese sauce. Regular canned vegetables (not low-sodium or reduced-sodium). Regular canned tomato sauce and paste (not low-sodium or reduced-sodium). Regular tomato and vegetable juice (not low-sodium or reduced-sodium). Pickles. Olives. Fruits Canned fruit in a light or heavy syrup. Fried fruit. Fruit in cream or butter sauce. Meat and other protein foods Fatty cuts of meat. Ribs. Fried meat. Bacon. Sausage. Bologna and other processed lunch meats. Salami. Fatback. Hotdogs. Bratwurst. Salted nuts and seeds. Canned beans with added salt. Canned or smoked fish. Whole eggs or egg yolks. Chicken or turkey with skin. Dairy Whole or 2% milk, cream, and half-and-half. Whole or full-fat cream cheese. Whole-fat or sweetened yogurt. Full-fat cheese. Nondairy creamers. Whipped toppings.  Processed cheese and cheese spreads. Fats and oils Butter. Stick margarine. Lard. Shortening. Ghee. Bacon fat. Tropical oils, such as coconut, palm kernel, or palm oil. Seasoning and other foods Salted popcorn and pretzels. Onion salt, garlic salt, seasoned salt, table salt, and sea salt. Worcestershire sauce. Tartar sauce. Barbecue sauce. Teriyaki sauce. Soy sauce, including reduced-sodium. Steak sauce. Canned and packaged gravies. Fish sauce. Oyster sauce. Cocktail sauce. Horseradish that you find on the shelf. Ketchup. Mustard. Meat flavorings and tenderizers. Bouillon cubes. Hot sauce and Tabasco sauce. Premade or packaged marinades. Premade or packaged taco seasonings. Relishes. Regular salad dressings. Where to find more information:  National Heart, Lung, and Blood Institute: www.nhlbi.nih.gov  American Heart Association: www.heart.org Summary  The DASH eating plan is a healthy eating plan that has been shown to reduce high blood pressure (hypertension). It may also reduce your risk for type 2 diabetes, heart disease, and stroke.  With the DASH eating plan, you should limit salt (sodium) intake to 2,300 mg a day. If you have hypertension, you may need to reduce your sodium intake to 1,500 mg a day.  When on the DASH eating plan, aim to eat more fresh fruits and vegetables, whole grains, lean proteins, low-fat dairy, and heart-healthy fats.  Work with your health care provider or diet and nutrition specialist (dietitian) to adjust your eating plan to your   individual calorie needs. This information is not intended to replace advice given to you by your health care provider. Make sure you discuss any questions you have with your health care provider. Document Revised: 07/02/2017 Document Reviewed: 07/13/2016 Elsevier Patient Education  2020 Elsevier Inc.  

## 2020-01-27 LAB — COMPREHENSIVE METABOLIC PANEL
ALT: 26 IU/L (ref 0–32)
AST: 23 IU/L (ref 0–40)
Albumin/Globulin Ratio: 2.3 — ABNORMAL HIGH (ref 1.2–2.2)
Albumin: 4.6 g/dL (ref 3.8–4.8)
Alkaline Phosphatase: 57 IU/L (ref 48–121)
BUN/Creatinine Ratio: 16 (ref 9–23)
BUN: 11 mg/dL (ref 6–24)
Bilirubin Total: 0.3 mg/dL (ref 0.0–1.2)
CO2: 23 mmol/L (ref 20–29)
Calcium: 9.5 mg/dL (ref 8.7–10.2)
Chloride: 102 mmol/L (ref 96–106)
Creatinine, Ser: 0.7 mg/dL (ref 0.57–1.00)
GFR calc Af Amer: 124 mL/min/{1.73_m2} (ref >59)
GFR calc non Af Amer: 107 mL/min/{1.73_m2} (ref >59)
Globulin, Total: 2 g/dL (ref 1.5–4.5)
Glucose: 89 mg/dL (ref 65–99)
Potassium: 4.6 mmol/L (ref 3.5–5.2)
Sodium: 138 mmol/L (ref 134–144)
Total Protein: 6.6 g/dL (ref 6.0–8.5)

## 2020-01-27 LAB — CBC WITH DIFFERENTIAL/PLATELET
Basophils Absolute: 0 10*3/uL (ref 0.0–0.2)
Basos: 0 %
EOS (ABSOLUTE): 0.4 10*3/uL (ref 0.0–0.4)
Eos: 6 %
Hematocrit: 42.5 % (ref 34.0–46.6)
Hemoglobin: 14.7 g/dL (ref 11.1–15.9)
Immature Grans (Abs): 0 10*3/uL (ref 0.0–0.1)
Immature Granulocytes: 0 %
Lymphocytes Absolute: 1.8 10*3/uL (ref 0.7–3.1)
Lymphs: 26 %
MCH: 32.5 pg (ref 26.6–33.0)
MCHC: 34.6 g/dL (ref 31.5–35.7)
MCV: 94 fL (ref 79–97)
Monocytes Absolute: 0.5 10*3/uL (ref 0.1–0.9)
Monocytes: 8 %
Neutrophils Absolute: 4.2 10*3/uL (ref 1.4–7.0)
Neutrophils: 60 %
Platelets: 238 10*3/uL (ref 150–450)
RBC: 4.53 x10E6/uL (ref 3.77–5.28)
RDW: 13.8 % (ref 11.7–15.4)
WBC: 7 10*3/uL (ref 3.4–10.8)

## 2020-01-27 LAB — LIPID PANEL WITH LDL/HDL RATIO
Cholesterol, Total: 145 mg/dL (ref 100–199)
HDL: 52 mg/dL (ref >39)
LDL Chol Calc (NIH): 80 mg/dL (ref 0–99)
LDL/HDL Ratio: 1.5 ratio (ref 0.0–3.2)
Triglycerides: 66 mg/dL (ref 0–149)
VLDL Cholesterol Cal: 13 mg/dL (ref 5–40)

## 2020-01-29 ENCOUNTER — Telehealth: Payer: Self-pay | Admitting: *Deleted

## 2020-01-29 NOTE — Telephone Encounter (Signed)
Oral swab drug screen was consistent for prescribed medications.  ?

## 2020-01-31 ENCOUNTER — Other Ambulatory Visit: Payer: Self-pay

## 2020-01-31 ENCOUNTER — Ambulatory Visit
Admission: RE | Admit: 2020-01-31 | Discharge: 2020-01-31 | Disposition: A | Discharge status: Home / Self Care | Payer: BC Managed Care – PPO | Source: Ambulatory Visit | Attending: Oncology | Admitting: Oncology

## 2020-01-31 DIAGNOSIS — R2232 Localized swelling, mass and lump, left upper limb: Secondary | ICD-10-CM | POA: Diagnosis not present

## 2020-01-31 DIAGNOSIS — C50511 Malignant neoplasm of lower-outer quadrant of right female breast: Secondary | ICD-10-CM | POA: Diagnosis not present

## 2020-02-16 ENCOUNTER — Other Ambulatory Visit: Payer: Self-pay

## 2020-02-16 ENCOUNTER — Encounter: Payer: Self-pay | Admitting: Registered Nurse

## 2020-02-16 ENCOUNTER — Encounter: Payer: BC Managed Care – PPO | Attending: Physical Medicine and Rehabilitation | Admitting: Registered Nurse

## 2020-02-16 VITALS — BP 133/91 | HR 85 | Temp 98.9°F | Ht 63.0 in | Wt 160.0 lb

## 2020-02-16 DIAGNOSIS — Z5181 Encounter for therapeutic drug level monitoring: Secondary | ICD-10-CM

## 2020-02-16 DIAGNOSIS — T451X5S Adverse effect of antineoplastic and immunosuppressive drugs, sequela: Secondary | ICD-10-CM | POA: Insufficient documentation

## 2020-02-16 DIAGNOSIS — Z9221 Personal history of antineoplastic chemotherapy: Secondary | ICD-10-CM | POA: Diagnosis not present

## 2020-02-16 DIAGNOSIS — M79604 Pain in right leg: Secondary | ICD-10-CM | POA: Insufficient documentation

## 2020-02-16 DIAGNOSIS — F17210 Nicotine dependence, cigarettes, uncomplicated: Secondary | ICD-10-CM | POA: Diagnosis not present

## 2020-02-16 DIAGNOSIS — M79605 Pain in left leg: Secondary | ICD-10-CM | POA: Insufficient documentation

## 2020-02-16 DIAGNOSIS — Z79891 Long term (current) use of opiate analgesic: Secondary | ICD-10-CM | POA: Diagnosis not present

## 2020-02-16 DIAGNOSIS — Z760 Encounter for issue of repeat prescription: Secondary | ICD-10-CM | POA: Diagnosis not present

## 2020-02-16 DIAGNOSIS — M961 Postlaminectomy syndrome, not elsewhere classified: Secondary | ICD-10-CM | POA: Insufficient documentation

## 2020-02-16 DIAGNOSIS — M5416 Radiculopathy, lumbar region: Secondary | ICD-10-CM

## 2020-02-16 DIAGNOSIS — G8929 Other chronic pain: Secondary | ICD-10-CM | POA: Diagnosis not present

## 2020-02-16 DIAGNOSIS — G894 Chronic pain syndrome: Secondary | ICD-10-CM | POA: Insufficient documentation

## 2020-02-16 DIAGNOSIS — M545 Low back pain: Secondary | ICD-10-CM | POA: Diagnosis not present

## 2020-02-16 DIAGNOSIS — G620 Drug-induced polyneuropathy: Secondary | ICD-10-CM | POA: Insufficient documentation

## 2020-02-16 DIAGNOSIS — T451X5A Adverse effect of antineoplastic and immunosuppressive drugs, initial encounter: Secondary | ICD-10-CM

## 2020-02-16 MED ORDER — HYDROCODONE-ACETAMINOPHEN 10-325 MG PO TABS: 1.0000 | ORAL_TABLET | Freq: Four times a day (QID) | ORAL | 0 refills | Status: DC | PRN

## 2020-02-16 NOTE — Progress Notes (Signed)
Subjective:    Patient ID: Elizabeth Torres, female    DOB: 09-Jun-1976, 44 y.o.   MRN: [id number]  HPI: Elizabeth Torres is a 44 y.o. female who returns for follow up appointment for chronic pain and medication refill. She states her pain is located in her lower back radiating into her bilateral lower extremities. She rates her pain 4. Her current exercise regime is walking and performing stretching exercises.  Elizabeth Torres Morphine equivalent is 48.33  MME.    Last Oral Swab was Performed on 01/18/2020, it was consistent.    Pain Inventory Average Pain 4 Pain Right Now 4 My pain is constant and aching  In the last 24 hours, has pain interfered with the following? General activity 3 Relation with others 3 Enjoyment of life 3 What TIME of day is your pain at its worst? evening Sleep (in general) Fair  Pain is worse with: inactivity Pain improves with: rest and medication Relief from Meds: 5  Mobility walk without assistance ability to climb steps?  yes do you drive?  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  yes Left arm ultrasound at [facility]  Physicians involved in your care Any changes since last visit?  no   Family History  Problem Relation Age of Onset  . Diabetes Mother   . Hypertension Mother   . Cancer Paternal Uncle   . Breast cancer Paternal Uncle        40'S  . Breast cancer Paternal Aunt        51'S   Social History   Socioeconomic History  . Marital status: Married    Spouse name: Not on file  . Number of children: Not on file  . Years of education: Not on file  . Highest education level: Not on file  Occupational History  . Not on file  Tobacco Use  . Smoking status: Former Smoker    Packs/day: 1.00    Years: 20.00    Pack years: 20.00    Types: Cigarettes    Quit date: 01/08/2020    Years since quitting: 0.1  . Smokeless  tobacco: Never Used  Vaping Use  . Vaping Use: Never used  Substance and Sexual Activity  . Alcohol use: Yes    Comment: socially  . Drug use: No  . Sexual activity: Not on file  Other Topics Concern  . Not on file  Social History Narrative  . Not on file   Social Determinants of Health   Financial Resource Strain:   . Difficulty of Paying Living Expenses:   Food Insecurity:   . Worried About Charity fundraiser in the Last Year:   . Arboriculturist in the Last Year:   Transportation Needs:   . Film/video editor (Medical):   Marland Kitchen Lack of Transportation (Non-Medical):   Physical Activity:   . Days of Exercise per Week:   . Minutes of Exercise per Session:   Stress:   . Feeling of Stress :   Social Connections:   . Frequency of Communication with Friends and Family:   . Frequency of Social Gatherings with Friends and Family:   . Attends Religious Services:   . Active Member of Clubs or Organizations:   . Attends Archivist Meetings:   Marland Kitchen Marital Status:    Past Surgical History:  Procedure Laterality Date  . BREAST BIOPSY Right 07/2011   invasive ductal carcinoma  . 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 RAd and chemo tx  . BREAST SURGERY    . mastectomy Right    partial with lymph node dissection  . PORT A CATH REVISION    . PORT-A-CATH REMOVAL  11-07-15   Dr Bary Castilla  . SHOULDER ARTHROSCOPY WITH ROTATOR CUFF REPAIR Left 05/18/2019   Procedure: SHOULDER ARTHROSCOPY WITH SUBACROMIAL DECOMPRESSION AND DISTAL CLAVICAL EXCISION, INTRAARTICULAR DEBRIDEMENT;  Surgeon: Lovell Sheehan, MD;  Location: [facility];  Service: Orthopedics;  Laterality: Left;  . SPINE SURGERY  2008   Dr Joya Salm  . TUBAL LIGATION     Past Medical History:  Diagnosis Date  . BRCA negative   . Breast cancer ([place]) 2013   RT LUMPECTOMY with chemo and rad tx  . Degenerative disc disease, lumbar   . Personal history of chemotherapy  2013   for breast ca  . Personal history of radiation therapy 2013   F/U right breast cancer   . Radiation 2013   BREAST CA  . Status post chemotherapy 2013   BREAST CA   BP (!) 133/91   Pulse 85   Temp 98.9 F (37.2 C)   Ht '5\' 3"'  (1.6 m)   Wt 160 lb (72.6 kg)   LMP 01/08/2020   SpO2 95%   BMI 28.34 kg/m   Opioid Risk Score:   Fall Risk Score:  `1  Depression screen PHQ 2/9  Depression screen [facility] 2/9 01/26/2020 01/18/2020 04/26/2019 12/15/2018 10/14/2018 09/15/2018 07/15/2018  Decreased Interest 0 0 0 0 0 0 0  Down, Depressed, Hopeless 0 0 0 0 0 0 0  PHQ - 2 Score 0 0 0 0 0 0 0  Altered sleeping 0 - 0 - - - -  Tired, decreased energy 0 - 0 - - - -  Change in appetite 0 - 0 - - - -  Feeling bad or failure about yourself  0 - 0 - - - -  Trouble concentrating 0 - 0 - - - -  Moving slowly or fidgety/restless 0 - 0 - - - -  Suicidal thoughts 0 - 0 - - - -  PHQ-9 Score 0 - 0 - - - -  Difficult doing work/chores Not difficult at all - - - - - -  Some recent data might be hidden   Review of Systems  Constitutional: Negative.   HENT: Negative.   Eyes: Negative.   Respiratory: Negative.   Cardiovascular: Negative.   Gastrointestinal: Negative.   Endocrine: Negative.   Genitourinary: Negative.   Musculoskeletal: Positive for back pain.       Lower back and legs  Skin: Negative.   Allergic/Immunologic: Negative.   Neurological: Negative.   Hematological: Negative.   Psychiatric/Behavioral: Negative.        Objective:   Physical Exam Vitals and nursing note reviewed.  Constitutional:      Appearance: Normal appearance.  Cardiovascular:     Rate and Rhythm: Normal rate and regular rhythm.     Pulses: Normal pulses.     Heart sounds: Normal heart sounds.  Pulmonary:     Effort: Pulmonary effort is normal.     Breath sounds: Normal breath sounds.  Musculoskeletal:     Cervical back: Normal range of motion and neck supple.     Comments: Normal Muscle Bulk and Muscle  Testing Reveals:  Upper Extremities: Full ROM and Muscle Strength 5/5  Lower Extremities: Full ROM and Muscle Strength 5/5 Arises from Table with ease Narrow Based  Gait   Skin:    General: Skin is warm and dry.  Neurological:     Mental Status: She is alert and oriented to person, place, and time.  Psychiatric:        Mood and Affect: Mood normal.        Behavior: Behavior normal.           Assessment & Plan:  1. Lumbar postlaminectomy syndrome status post L5-S1 fusion with chronic S1 radiculopathy.Continuecurrent medication regimen withNortriptyline.02/16/2020. Refilled:Hydrocodone 10/325 mg #145-one every 6 hours as needed for pain, may take an extra tablet on work days only.02/16/2020. We will continue the opioid monitoring program, this consists of regular clinic visits, examinations, urine drug screen, pill counts as well as use of [place] Controlled Substance reporting System. 2. 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 Insomnia: Continuecurrent medication regimenAmbien.02/16/2020 4.Chronic Left Shoulder Pain:No complaints today: S/PLeft Shoulder Arthroscopy with Rotator Cuff Repair on 05/18/2019 by Dr. Gunnar Fusi: Ortho Following.02/16/2020 5. Muscle Spasm: Continue : Robaxin07/16/2021  F/U in 1 month.  11mnutes of face to face patient care time was spent during this visit. All questions were encouraged and answered.

## 2020-03-12 DIAGNOSIS — Z20822 Contact with and (suspected) exposure to covid-19: Secondary | ICD-10-CM | POA: Diagnosis not present

## 2020-03-18 ENCOUNTER — Encounter: Payer: BC Managed Care – PPO | Admitting: Family Medicine

## 2020-03-21 ENCOUNTER — Telehealth: Payer: Self-pay

## 2020-03-21 NOTE — Telephone Encounter (Signed)
Please double dose of nortriptyline take 2 tablets at night, 20 mg If this is not helpful after a week she will need an appointment to evaluate this and see if there is anything else going on

## 2020-03-21 NOTE — Telephone Encounter (Signed)
Elizabeth Torres called:  s there another medication I can try for my foot pain? Nortriptyline is not helping. For the past 3 weeks pain has worsen. With activity its a level 8/10 without activity its a level 4/10.   Please advise.

## 2020-03-22 NOTE — Telephone Encounter (Signed)
Notified by VM per DPR.

## 2020-03-26 ENCOUNTER — Other Ambulatory Visit: Payer: Self-pay

## 2020-03-26 ENCOUNTER — Encounter
Payer: BC Managed Care – PPO | Attending: Physical Medicine and Rehabilitation | Admitting: Physical Medicine & Rehabilitation

## 2020-03-26 ENCOUNTER — Encounter: Payer: Self-pay | Admitting: Physical Medicine & Rehabilitation

## 2020-03-26 VITALS — BP 125/87 | HR 97 | Temp 98.1°F | Ht 63.0 in | Wt 156.4 lb

## 2020-03-26 DIAGNOSIS — M961 Postlaminectomy syndrome, not elsewhere classified: Secondary | ICD-10-CM | POA: Diagnosis not present

## 2020-03-26 DIAGNOSIS — Z9221 Personal history of antineoplastic chemotherapy: Secondary | ICD-10-CM | POA: Diagnosis not present

## 2020-03-26 DIAGNOSIS — M722 Plantar fascial fibromatosis: Secondary | ICD-10-CM | POA: Diagnosis not present

## 2020-03-26 DIAGNOSIS — T451X5S Adverse effect of antineoplastic and immunosuppressive drugs, sequela: Secondary | ICD-10-CM | POA: Insufficient documentation

## 2020-03-26 DIAGNOSIS — M79605 Pain in left leg: Secondary | ICD-10-CM | POA: Diagnosis not present

## 2020-03-26 DIAGNOSIS — G620 Drug-induced polyneuropathy: Secondary | ICD-10-CM

## 2020-03-26 DIAGNOSIS — Z79891 Long term (current) use of opiate analgesic: Secondary | ICD-10-CM | POA: Diagnosis not present

## 2020-03-26 DIAGNOSIS — T451X5A Adverse effect of antineoplastic and immunosuppressive drugs, initial encounter: Secondary | ICD-10-CM | POA: Diagnosis not present

## 2020-03-26 DIAGNOSIS — Z760 Encounter for issue of repeat prescription: Secondary | ICD-10-CM | POA: Diagnosis not present

## 2020-03-26 DIAGNOSIS — Z5181 Encounter for therapeutic drug level monitoring: Secondary | ICD-10-CM | POA: Insufficient documentation

## 2020-03-26 DIAGNOSIS — G8929 Other chronic pain: Secondary | ICD-10-CM | POA: Insufficient documentation

## 2020-03-26 DIAGNOSIS — G894 Chronic pain syndrome: Secondary | ICD-10-CM | POA: Diagnosis not present

## 2020-03-26 DIAGNOSIS — M79604 Pain in right leg: Secondary | ICD-10-CM | POA: Insufficient documentation

## 2020-03-26 DIAGNOSIS — M545 Low back pain: Secondary | ICD-10-CM | POA: Insufficient documentation

## 2020-03-26 DIAGNOSIS — F17210 Nicotine dependence, cigarettes, uncomplicated: Secondary | ICD-10-CM | POA: Diagnosis not present

## 2020-03-26 MED ORDER — HYDROCODONE-ACETAMINOPHEN 10-325 MG PO TABS: 1.0000 | ORAL_TABLET | Freq: Four times a day (QID) | ORAL | 0 refills | Status: DC | PRN

## 2020-03-26 MED ORDER — NORTRIPTYLINE HCL 10 MG PO CAPS: ORAL_CAPSULE | ORAL | 3 refills | Status: DC

## 2020-03-26 NOTE — Progress Notes (Signed)
Subjective:    Patient ID: Elizabeth Torres, female    DOB: Oct 21, 1975, 44 y.o.   MRN: [id number]  HPI 44 yo female with chemotherapy induced painful polyneuropathy, has been well controlled with hydrocodone 4-5 tablets per day in combination with nortriptyline 20m per day.   Increased bilateral foot, Progressed over the last 4 weeks, Has been wearing insert which helped a little.  The pt was advised to increase nortriptyline to 293mqhs but this has not helped.  Pain in in bottom of feet, worsens when she first gets up.   No change in work or home activity No new areas of pain no back pain  No falls or trauma  Gabapentin and pregabalin previously tried and were ineffective  Pain Inventory Average Pain 4 Pain Right Now 4 My pain is constant, dull and aching  In the last 24 hours, has pain interfered with the following? General activity 3 Relation with others 3 Enjoyment of life 3 What TIME of day is your pain at its worst? evening Sleep (in general) Fair  Pain is worse with: inactivity and some activites Pain improves with: rest and medication Relief from Meds: 8  Family History  Problem Relation Age of Onset  . Diabetes Mother   . Hypertension Mother   . Cancer Paternal Uncle   . Breast cancer Paternal Uncle        40'S  . Breast cancer Paternal Aunt        6069'S Social History   Socioeconomic History  . Marital status: Married    Spouse name: Not on file  . Number of children: Not on file  . Years of education: Not on file  . Highest education level: Not on file  Occupational History  . Not on file  Tobacco Use  . Smoking status: Former Smoker    Packs/day: 1.00    Years: 20.00    Pack years: 20.00    Types: Cigarettes    Quit date: 01/08/2020    Years since quitting: 0.2  . Smokeless tobacco: Never Used  Vaping Use  . Vaping Use: Never used  Substance and Sexual Activity  . Alcohol use: Yes    Comment: socially  . Drug use: No  . Sexual activity: Yes    Other Topics Concern  . Not on file  Social History Narrative  . Not on file   Social Determinants of Health   Financial Resource Strain:   . Difficulty of Paying Living Expenses: Not on file  Food Insecurity:   . Worried About RuCharity fundraisern the Last Year: Not on file  . Ran Out of Food in the Last Year: Not on file  Transportation Needs:   . Lack of Transportation (Medical): Not on file  . Lack of Transportation (Non-Medical): Not on file  Physical Activity:   . Days of Exercise per Week: Not on file  . Minutes of Exercise per Session: Not on file  Stress:   . Feeling of Stress : Not on file  Social Connections:   . Frequency of Communication with Friends and Family: Not on file  . Frequency of Social Gatherings with Friends and Family: Not on file  . Attends Religious Services: Not on file  . Active Member of Clubs or Organizations: Not on file  . Attends ClArchivisteetings: Not on file  . Marital Status: Not on file   Past Surgical History:  Procedure Laterality Date  . BREAST  BIOPSY Right 07/2011   invasive ductal carcinoma  . 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 RAd and chemo tx  . BREAST SURGERY    . mastectomy Right    partial with lymph node dissection  . PORT A CATH REVISION    . PORT-A-CATH REMOVAL  11-07-15   Dr Bary Castilla  . SHOULDER ARTHROSCOPY WITH ROTATOR CUFF REPAIR Left 05/18/2019   Procedure: SHOULDER ARTHROSCOPY WITH SUBACROMIAL DECOMPRESSION AND DISTAL CLAVICAL EXCISION, INTRAARTICULAR DEBRIDEMENT;  Surgeon: Lovell Sheehan, MD;  Location: [place];  Service: Orthopedics;  Laterality: Left;  . SPINE SURGERY  2008   Dr Joya Salm  . TUBAL LIGATION     Past Surgical History:  Procedure Laterality Date  . BREAST BIOPSY Right 07/2011   invasive ductal carcinoma  . 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 RAd and chemo tx  . BREAST  SURGERY    . mastectomy Right    partial with lymph node dissection  . PORT A CATH REVISION    . PORT-A-CATH REMOVAL  11-07-15   Dr Bary Castilla  . SHOULDER ARTHROSCOPY WITH ROTATOR CUFF REPAIR Left 05/18/2019   Procedure: SHOULDER ARTHROSCOPY WITH SUBACROMIAL DECOMPRESSION AND DISTAL CLAVICAL EXCISION, INTRAARTICULAR DEBRIDEMENT;  Surgeon: Lovell Sheehan, MD;  Location: [place];  Service: Orthopedics;  Laterality: Left;  . SPINE SURGERY  2008   Dr Joya Salm  . TUBAL LIGATION     Past Medical History:  Diagnosis Date  . BRCA negative   . Breast cancer ([place]) 2013   RT LUMPECTOMY with chemo and rad tx  . Degenerative disc disease, lumbar   . Personal history of chemotherapy 2013   for breast ca  . Personal history of radiation therapy 2013   F/U right breast cancer   . Radiation 2013   BREAST CA  . Status post chemotherapy 2013   BREAST CA   BP 125/87   Pulse 97   Temp 98.1 F (36.7 C)   Ht _0  (1.6 m)   Wt 156 lb 6.4 oz (70.9 kg)   SpO2 96%   BMI 27.71 kg/m   Opioid Risk Score:   Fall Risk Score:  `1  Depression screen PHQ 2/9  Depression screen [facility] 2/9 02/16/2020 01/26/2020 01/18/2020 04/26/2019 12/15/2018 10/14/2018 09/15/2018  Decreased Interest 0 0 0 0 0 0 0  Down, Depressed, Hopeless 0 0 0 0 0 0 0  PHQ - 2 Score 0 0 0 0 0 0 0  Altered sleeping - 0 - 0 - - -  Tired, decreased energy - 0 - 0 - - -  Change in appetite - 0 - 0 - - -  Feeling bad or failure about yourself  - 0 - 0 - - -  Trouble concentrating - 0 - 0 - - -  Moving slowly or fidgety/restless - 0 - 0 - - -  Suicidal thoughts - 0 - 0 - - -  PHQ-9 Score - 0 - 0 - - -  Difficult doing work/chores - Not difficult at all - - - - -  Some recent data might be hidden   Review of Systems  Constitutional: Negative.   HENT: Negative.   Eyes: Negative.   Respiratory: Negative.   Cardiovascular: Negative.   Gastrointestinal: Negative.   Endocrine: Negative.   Genitourinary: Negative.   Musculoskeletal:  Positive for back pain.       Leg pain  Skin: Negative.  Allergic/Immunologic: Negative.   Neurological: Negative.   Hematological: Negative.   Psychiatric/Behavioral: Negative.   All other systems reviewed and are negative.      Objective:   Physical Exam Vitals and nursing note reviewed.  Constitutional:      Appearance: She is normal weight.  HENT:     Head: Normocephalic and atraumatic.  Eyes:     Extraocular Movements: Extraocular movements intact.     Conjunctiva/sclera: Conjunctivae normal.     Pupils: Pupils are equal, round, and reactive to light.  Musculoskeletal:     Comments: Right and Left foot no joint swelling, normal pulses, mild pain with ROM, plantar stretch is painful no pain over met heads or plantar aspect of calcaneus  Skin:    General: Skin is warm and dry.  Neurological:     Mental Status: She is alert and oriented to person, place, and time.  Psychiatric:        Mood and Affect: Mood normal.        Behavior: Behavior normal.        Thought Content: Thought content normal.        Judgment: Judgment normal.   Sensation reduced to pinprick below mid leg level right at the lower aspect of the gastrocnemius.  The patient cannot identify pinprick below that level but it is perceived as dull chart. Intact proprioception bilateral lower extremities. Skin shows no evidence of ulceration or lesions on either foot. Gait is normal       Assessment & Plan:  #1.  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  She has been well controlled on a combination of hydrocodone and nortriptyline however she has had breakthrough pain. Pain is mainly plantar worse with initiation of ambulation.  She had some relief with foot inserts.  I suspect she may have a prominent plantar fasciitis, will make referral to podiatry. Increase nortriptyline to 30 mg/day Continue hydrocodone 145 tablets/month PDMP reviewed, last toxicology 01/18/2020 was appropriate I would  like to see patient back after she sees podiatry.

## 2020-03-26 NOTE — Patient Instructions (Signed)
Plantar Fasciitis Rehab Ask your health care provider which exercises are safe for you. Do exercises exactly as told by your health care provider and adjust them as directed. It is normal to feel mild stretching, pulling, tightness, or discomfort as you do these exercises. Stop right away if you feel sudden pain or your pain gets worse. Do not begin these exercises until told by your health care provider. Stretching and range-of-motion exercises These exercises warm up your muscles and joints and improve the movement and flexibility of your foot. These exercises also help to relieve pain. Plantar fascia stretch  1. Sit with your left / right leg crossed over your opposite knee. 2. Hold your heel with one hand with that thumb near your arch. With your other hand, hold your toes and gently pull them back toward the top of your foot. You should feel a stretch on the bottom of your toes or your foot (plantar fascia) or both. 3. Hold this stretch for__________ seconds. 4. Slowly release your toes and return to the starting position. Repeat __________ times. Complete this exercise __________ times a day. Gastrocnemius stretch, standing This exercise is also called a calf (gastroc) stretch. It stretches the muscles in the back of the upper calf. 1. Stand with your hands against a wall. 2. Extend your left / right leg behind you, and bend your front knee slightly. 3. Keeping your heels on the floor and your back knee straight, shift your weight toward the wall. Do not arch your back. You should feel a gentle stretch in your upper left / right calf. 4. Hold this position for __________ seconds. Repeat __________ times. Complete this exercise __________ times a day. Soleus stretch, standing This exercise is also called a calf (soleus) stretch. It stretches the muscles in the back of the lower calf. 1. Stand with your hands against a wall. 2. Extend your left / right leg behind you, and bend your front  knee slightly. 3. Keeping your heels on the floor, bend your back knee and shift your weight slightly over your back leg. You should feel a gentle stretch deep in your lower calf. 4. Hold this position for __________ seconds. Repeat __________ times. Complete this exercise __________ times a day. Gastroc and soleus stretch, standing step This exercise stretches the muscles in the back of the lower leg. These muscles are in the upper calf (gastrocnemius) and the lower calf (soleus). 1. Stand with the ball of your left / right foot on a step. The ball of your foot is on the walking surface, right under your toes. 2. Keep your other foot firmly on the same step. 3. Hold on to the wall or a railing for balance. 4. Slowly lift your other foot, allowing your body weight to press your left / right heel down over the edge of the step. You should feel a stretch in your left / right calf. 5. Hold this position for __________ seconds. 6. Return both feet to the step. 7. Repeat this exercise with a slight bend in your left / right knee. Repeat __________ times with your left / right knee straight and __________ times with your left / right knee bent. Complete this exercise __________ times a day. Balance exercise This exercise builds your balance and strength control of your arch to help take pressure off your plantar fascia. Single leg stand If this exercise is too easy, you can try it with your eyes closed or while standing on a pillow. 1.   Without shoes, stand near a railing or in a doorway. You may hold on to the railing or door frame as needed. 2. Stand on your left / right foot. Keep your big toe down on the floor and try to keep your arch lifted. Do not let your foot roll inward. 3. Hold this position for __________ seconds. Repeat __________ times. Complete this exercise __________ times a day. This information is not intended to replace advice given to you by your health care provider. Make sure  you discuss any questions you have with your health care provider. Document Revised: 11/10/2018 Document Reviewed: 05/18/2018 Elsevier Patient Education  2020 Elsevier Inc.  

## 2020-03-29 ENCOUNTER — Encounter: Payer: BC Managed Care – PPO | Admitting: Registered Nurse

## 2020-04-02 ENCOUNTER — Ambulatory Visit (INDEPENDENT_AMBULATORY_CARE_PROVIDER_SITE_OTHER): Payer: BC Managed Care – PPO | Admitting: Podiatry

## 2020-04-02 ENCOUNTER — Other Ambulatory Visit: Payer: Self-pay

## 2020-04-02 ENCOUNTER — Ambulatory Visit (INDEPENDENT_AMBULATORY_CARE_PROVIDER_SITE_OTHER): Payer: BC Managed Care – PPO

## 2020-04-02 ENCOUNTER — Encounter: Payer: Self-pay | Admitting: Podiatry

## 2020-04-02 DIAGNOSIS — M722 Plantar fascial fibromatosis: Secondary | ICD-10-CM

## 2020-04-02 DIAGNOSIS — N921 Excessive and frequent menstruation with irregular cycle: Secondary | ICD-10-CM | POA: Insufficient documentation

## 2020-04-02 DIAGNOSIS — M792 Neuralgia and neuritis, unspecified: Secondary | ICD-10-CM

## 2020-04-02 HISTORY — DX: Excessive and frequent menstruation with irregular cycle: N92.1

## 2020-04-02 MED ORDER — NONFORMULARY OR COMPOUNDED ITEM: 1 refills | Status: DC

## 2020-04-03 ENCOUNTER — Encounter: Payer: Self-pay | Admitting: Podiatry

## 2020-04-03 ENCOUNTER — Other Ambulatory Visit: Payer: Self-pay | Admitting: Registered Nurse

## 2020-04-03 NOTE — Progress Notes (Signed)
Subjective:  Patient ID: Elizabeth Torres, female    DOB: [birth year],  MRN: [id number]  Chief Complaint  Patient presents with  . Foot Pain    Patient presents today for bilat feet pain, heel pain    44 y.o. female presents with the above complaint.  Patient presents with bilateral heel pain that has been going on for years and has progressive gotten worse.  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  Patient now has started getting pains in the heel.  Patient states the pain has become more intense is burning shooting in nature.  The right side is worse than left side the heel pain radiates into the heel and back of the leg.  Patient has tried inserts frozen water bottle Epson salt but none of that has helped.  Pain scale 6 out of 10 is sharp shooting in nature.  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   Review of Systems: Negative except as noted in the HPI. Denies N/V/F/Ch.  Past Medical History:  Diagnosis Date  . BRCA negative   . Breast cancer ([place]) 2013   RT LUMPECTOMY with chemo and rad tx  . Degenerative disc disease, lumbar   . Personal history of chemotherapy 2013   for breast ca  . Personal history of radiation therapy 2013   F/U right breast cancer   . Radiation 2013   BREAST CA  . Status post chemotherapy 2013   BREAST CA    Current Outpatient Medications:  .  hydrochlorothiazide (HYDRODIURIL) 12.5 MG tablet, Take 1 tablet (12.5 mg total) by mouth daily., Disp: 90 tablet, Rfl: 3 .  HYDROcodone-acetaminophen (NORCO) 10-325 MG tablet, Take 1 tablet by mouth every 6 (six) hours as needed. May take an extra tablet on work days-no more than 5 per day prn, Disp: 145 tablet, Rfl: 0 .  ibuprofen (ADVIL,MOTRIN) 800 MG tablet, Take 800 mg by mouth every 6 (six) hours as needed (pain). , Disp: , Rfl:  .  methocarbamol (ROBAXIN) 500 MG tablet, Take 1 tablet  (500 mg total) by mouth 2 (two) times daily as needed for muscle spasms., Disp: 60 tablet, Rfl: 1 .  NONFORMULARY OR COMPOUNDED ITEM, Use as directed, Disp: 120 each, Rfl: 1 .  nortriptyline (PAMELOR) 10 MG capsule, TAKE 3 CAPSULES BY MOUTH EVERY DAY AT BEDTIME, Disp: 180 capsule, Rfl: 3 .  zolpidem (AMBIEN) 5 MG tablet, TAKE 1 TABLET BY MOUTH EVERY DAY AT BEDTIME AS NEEDED FOR INSOMNIA, Disp: 30 tablet, Rfl: 3  Social History   Tobacco Use  Smoking Status Former Smoker  . Packs/day: 1.00  . Years: 20.00  . Pack years: 20.00  . Types: Cigarettes  . Quit date: 01/08/2020  . Years since quitting: 0.2  Smokeless Tobacco Never Used    Allergies  Allergen Reactions  . Penicillins Anaphylaxis  . Sulfa Antibiotics Anaphylaxis  . Ondansetron     migraines  . Zofran [Ondansetron Hcl]     migraines   Objective:  There were no vitals filed for this visit. There is no height or weight on file to calculate BMI. Constitutional Well developed. Well nourished.  Vascular Dorsalis pedis pulses palpable bilaterally. Posterior tibial pulses palpable bilaterally. Capillary refill normal to all digits.  No cyanosis or clubbing noted. Pedal hair growth normal.  Neurologic Normal speech. Oriented to person, place, and time. Epicritic sensation to  light touch grossly present bilaterally.  Dermatologic Nails well groomed and normal in appearance. No open wounds. No skin lesions.  Orthopedic: Normal joint ROM without pain or crepitus bilaterally. No visible deformities. Tender to palpation at the calcaneal tuber bilaterally. No pain with calcaneal squeeze bilaterally. Ankle ROM full range of motion bilaterally. Silfverskiold Test: positive bilaterally.   Radiographs: Taken and reviewed. No acute fractures or dislocations. No evidence of stress fracture.  Plantar heel spur absent. Posterior heel spur absent.   Assessment:   1. Plantar fasciitis of right foot   2. Plantar fasciitis of left  foot   3. Neuropathic pain    Plan:  Patient was evaluated and treated and all questions answered.  Plantar Fasciitis, bilaterally - XR reviewed as above.  - Educated on icing and stretching. Instructions given.  - Injection delivered to the plantar fascia as below. 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  Patient has failed most of the over oral medication that are available for this type of pain and would like to discuss cream options.  I discussed with the patient that she could benefit from following formulary cream from specialty pharmacy that can help with neuropathic pain.  Patient states understanding would like to get a prescription for it. -Prescription was sent to specialty pharmacy for neuropathic pain  Procedure: Injection Tendon/Ligament Location: Bilateral plantar fascia at the glabrous junction; medial approach. Skin Prep: alcohol Injectate: 0.5 cc 0.5% marcaine plain, 0.5 cc of 1% Lidocaine, 0.5 cc kenalog 10. Disposition: Patient tolerated procedure well. Injection site dressed with a band-aid.  No follow-ups on file.

## 2020-04-19 ENCOUNTER — Encounter: Payer: BC Managed Care – PPO | Admitting: Registered Nurse

## 2020-04-22 ENCOUNTER — Encounter: Payer: BC Managed Care – PPO | Admitting: Registered Nurse

## 2020-04-26 ENCOUNTER — Telehealth: Payer: Self-pay | Admitting: *Deleted

## 2020-04-26 ENCOUNTER — Encounter: Payer: BC Managed Care – PPO | Admitting: Registered Nurse

## 2020-04-26 NOTE — Telephone Encounter (Signed)
She can be rescheduled for next week if she likes, when she is feeling better.

## 2020-04-26 NOTE — Telephone Encounter (Signed)
Elizabeth Torres called and reports that she is too sick to come in to the office and she will have to have a phone visit.  Please advise.

## 2020-04-26 NOTE — Telephone Encounter (Signed)
Appt desk is handling.

## 2020-04-29 ENCOUNTER — Encounter: Payer: Self-pay | Admitting: Emergency Medicine

## 2020-04-29 ENCOUNTER — Ambulatory Visit (INDEPENDENT_AMBULATORY_CARE_PROVIDER_SITE_OTHER): Payer: BC Managed Care – PPO

## 2020-04-29 ENCOUNTER — Ambulatory Visit
Admission: EM | Admit: 2020-04-29 | Discharge: 2020-04-29 | Disposition: A | Discharge status: Home / Self Care | Payer: BC Managed Care – PPO | Attending: Physician Assistant | Admitting: Physician Assistant

## 2020-04-29 ENCOUNTER — Other Ambulatory Visit: Payer: Self-pay

## 2020-04-29 DIAGNOSIS — Z853 Personal history of malignant neoplasm of breast: Secondary | ICD-10-CM | POA: Insufficient documentation

## 2020-04-29 DIAGNOSIS — R0781 Pleurodynia: Secondary | ICD-10-CM | POA: Insufficient documentation

## 2020-04-29 DIAGNOSIS — J209 Acute bronchitis, unspecified: Secondary | ICD-10-CM | POA: Diagnosis not present

## 2020-04-29 DIAGNOSIS — Z923 Personal history of irradiation: Secondary | ICD-10-CM | POA: Insufficient documentation

## 2020-04-29 DIAGNOSIS — Z9221 Personal history of antineoplastic chemotherapy: Secondary | ICD-10-CM | POA: Diagnosis not present

## 2020-04-29 DIAGNOSIS — U071 COVID-19: Secondary | ICD-10-CM | POA: Insufficient documentation

## 2020-04-29 DIAGNOSIS — R05 Cough: Secondary | ICD-10-CM | POA: Diagnosis not present

## 2020-04-29 DIAGNOSIS — Z87891 Personal history of nicotine dependence: Secondary | ICD-10-CM | POA: Diagnosis not present

## 2020-04-29 DIAGNOSIS — Z79899 Other long term (current) drug therapy: Secondary | ICD-10-CM | POA: Insufficient documentation

## 2020-04-29 DIAGNOSIS — R439 Unspecified disturbances of smell and taste: Secondary | ICD-10-CM

## 2020-04-29 DIAGNOSIS — R059 Cough, unspecified: Secondary | ICD-10-CM

## 2020-04-29 DIAGNOSIS — R0602 Shortness of breath: Secondary | ICD-10-CM | POA: Diagnosis not present

## 2020-04-29 LAB — SARS CORONAVIRUS 2 (TAT 6-24 HRS): SARS Coronavirus 2: POSITIVE — AB

## 2020-04-29 MED ORDER — PSEUDOEPH-BROMPHEN-DM 30-2-10 MG/5ML PO SYRP: 10.0000 mL | ORAL_SOLUTION | Freq: Four times a day (QID) | ORAL | 0 refills | Status: AC | PRN

## 2020-04-29 MED ORDER — ALBUTEROL SULFATE HFA 108 (90 BASE) MCG/ACT IN AERS: 1.0000 | INHALATION_SPRAY | RESPIRATORY_TRACT | 0 refills | Status: DC | PRN

## 2020-04-29 MED ORDER — PREDNISONE 20 MG PO TABS: 40.0000 mg | ORAL_TABLET | Freq: Every day | ORAL | 0 refills | Status: AC

## 2020-04-29 NOTE — ED Triage Notes (Signed)
Patient states it "hurts to breathe." She states she has been coughing x 1 week and is having chest pressure when she breathes.

## 2020-04-29 NOTE — Discharge Instructions (Addendum)
Your EKG was normal today. A chest x-ray was also read as normal today there is no sign of pneumonia or consolidated pulmonary disease in your chest. Chest x-ray cannot see if there are any blood clots in your lungs, but your symptoms have been going on for the past week and a half and not acutely worse. All of your vital signs are normal and your chest is clear. Based on your symptoms I suspect you have bronchitis likely due to a virus. Since you have complete loss of your smell and taste it is highly likely that you do have Covid. If your Covid test is positive, your isolation. Was 10 days from symptom onset. If you do have any worsening of the chest discomfort or feel short of breath, you should call EMS or have someone take immediately to the emergency department for evaluation.  You have received COVID testing today either for positive exposure, concerning symptoms that could be related to COVID infection, screening purposes, or re-testing after confirmed positive.  Your test obtained today checks for active viral infection in the last 1-2 weeks. If your test is negative now, you can still test positive later. So, if you do develop symptoms you should either get re-tested and/or isolate x 10 days. Please follow CDC guidelines.  While Rapid antigen tests come back in 15-20 minutes, send out PCR/molecular test results typically come back within 24 hours. In the mean time, if you are symptomatic, assume this could be a positive test and treat/monitor yourself as if you do have COVID.   We will call with test results. Please download the MyChart app and set up a profile to access test results.   If symptomatic, go home and rest. Push fluids. Take Tylenol as needed for discomfort. Gargle warm salt water. Throat lozenges. Take Mucinex DM or Robitussin for cough. Humidifier in bedroom to ease coughing. Warm showers. Also review the COVID handout for more information.  COVID-19 INFECTION: The incubation  period of COVID-19 is approximately 14 days after exposure, with most symptoms developing in roughly 4-5 days. Symptoms may range in severity from mild to critically severe. Roughly 80% of those infected will have mild symptoms. People of any age may become infected with COVID-19 and have the ability to transmit the virus. The most common symptoms include: fever, fatigue, cough, body aches, headaches, sore throat, nasal congestion, shortness of breath, nausea, vomiting, diarrhea, changes in smell and/or taste.    COURSE OF ILLNESS Some patients may begin with mild disease which can progress quickly into critical symptoms. If your symptoms are worsening please call ahead to the Emergency Department and proceed there for further treatment. Recovery time appears to be roughly 1-2 weeks for mild symptoms and 3-6 weeks for severe disease.   GO IMMEDIATELY TO ER FOR FEVER YOU ARE UNABLE TO GET DOWN WITH TYLENOL, BREATHING PROBLEMS, CHEST PAIN, FATIGUE, LETHARGY, INABILITY TO EAT OR DRINK, ETC  QUARANTINE AND ISOLATION: To help decrease the spread of COVID-19 please remain isolated if you have COVID infection or are highly suspected to have COVID infection. This means -stay home and isolate to one room in the home if you live with others. Do not share a bed or bathroom with others while ill, sanitize and wipe down all countertops and keep common areas clean and disinfected. You may discontinue isolation if you have a mild case and are asymptomatic 10 days after symptom onset as long as you have been fever free >24 hours without having to  take Motrin or Tylenol. If your case is more severe (meaning you develop pneumonia or are admitted in the hospital), you may have to isolate longer.   If you have been in close contact (within 6 feet) of someone diagnosed with COVID 19, you are advised to quarantine in your home for 14 days as symptoms can develop anywhere from 2-14 days after exposure to the virus. If you develop  symptoms, you  must isolate.  Most current guidelines for COVID after exposure -isolate 10 days if you ARE NOT tested for COVID as long as symptoms do not develop -isolate 7 days if you are tested and remain asymptomatic -You do not necessarily need to be tested for COVID if you have + exposure and        develop   symptoms. Just isolate at home x10 days from symptom onset During this global pandemic, CDC advises to practice social distancing, try to stay at least 27ft away from others at all times. Wear a face covering. Wash and sanitize your hands regularly and avoid going anywhere that is not necessary.  KEEP IN MIND THAT THE COVID TEST IS NOT 100% ACCURATE AND YOU SHOULD STILL DO EVERYTHING TO PREVENT POTENTIAL SPREAD OF VIRUS TO OTHERS (WEAR MASK, WEAR GLOVES, [place] HANDS AND SANITIZE REGULARLY). IF INITIAL TEST IS NEGATIVE, THIS MAY NOT MEAN YOU ARE DEFINITELY NEGATIVE. MOST ACCURATE TESTING IS DONE 5-7 DAYS AFTER EXPOSURE.   It is not advised by CDC to get re-tested after receiving a positive COVID test since you can still test positive for weeks to months after you have already cleared the virus.   *If you have not been vaccinated for COVID, I strongly suggest you consider getting vaccinated as long as there are no contraindications.

## 2020-04-29 NOTE — ED Provider Notes (Signed)
[facility]    CSN: [id number] Arrival date & time: 04/29/20  1914      History   Chief Complaint Chief Complaint  Patient presents with  . Cough    HPI Elizabeth Torres is a 44 y.o. female 1 week history of cough.  She says that her chest aches when she breathes and coughs.  Patient believes she had Covid about 10 or 11 days ago.  She was not tested for Covid. She says at the onset 10 or 11 days ago she had total loss of smell and taste and that has not returned. Patient denies known Covid exposure.  Patient not vaccinated for Covid. She says that her husband told her her symptoms seem to be getting worse, but she has not really noticed. She denies shortness of breath, but says she avoids taking deep breaths because the center of her chest aches mild to moderately. She denies fevers. She says her cough is productive at times and she also admits to nasal congestion and headaches. Also admits fatigue. She denies any sinus pain, ear pain, sore throat, abdominal pain, nausea, vomiting, diarrhea. Denies dizziness or palpitations. Patient's medical history significant for breast cancer in 2013.  Most recent mammogram in 2020 was negative. Patient denies any previous history of DVT or PE. Denies any history of clotting disorders. Denies any history of cardiopulmonary disease. She is a former smoker, but says she recently quit. She has been taking over-the-counter cough medicine as needed. She denies any other concerns today.  HPI  Past Medical History:  Diagnosis Date  . BRCA negative   . Breast cancer ([place]) 2013   RT LUMPECTOMY with chemo and rad tx  . Degenerative disc disease, lumbar   . Personal history of chemotherapy 2013   for breast ca  . Personal history of radiation therapy 2013   F/U right breast cancer   . Radiation 2013   BREAST CA  . Status post chemotherapy 2013   BREAST CA    Patient Active Problem List   Diagnosis Date Noted  . Menorrhagia with irregular  cycle 04/02/2020  . Impingement syndrome of left shoulder region 12/15/2018  . Cervical myofascial pain syndrome 02/14/2016  . History of breast cancer 11/07/2015  . Postlaminectomy syndrome, lumbar region 04/12/2012  . 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 ([place]) 08/07/2011    Past Surgical History:  Procedure Laterality Date  . BREAST BIOPSY Right 07/2011   invasive ductal carcinoma  . 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 RAd and chemo tx  . BREAST SURGERY    . mastectomy Right    partial with lymph node dissection  . PORT A CATH REVISION    . PORT-A-CATH REMOVAL  11-07-15   Dr Bary Castilla  . SHOULDER ARTHROSCOPY WITH ROTATOR CUFF REPAIR Left 05/18/2019   Procedure: SHOULDER ARTHROSCOPY WITH SUBACROMIAL DECOMPRESSION AND DISTAL CLAVICAL EXCISION, INTRAARTICULAR DEBRIDEMENT;  Surgeon: Lovell Sheehan, MD;  Location: [facility];  Service: Orthopedics;  Laterality: Left;  . SPINE SURGERY  2008   Dr Joya Salm  . TUBAL LIGATION      OB History    Gravida  3   Para      Term      Preterm      AB      Living  3     SAB      TAB  Ectopic      Multiple      Live Births               Home Medications    Prior to Admission medications   Medication Sig Start Date End Date Taking? Authorizing Provider  hydrochlorothiazide (HYDRODIURIL) 12.5 MG tablet Take 1 tablet (12.5 mg total) by mouth daily. 01/26/20  Yes Juline Patch, MD  HYDROcodone-acetaminophen (NORCO) 10-325 MG tablet Take 1 tablet by mouth every 6 (six) hours as needed. May take an extra tablet on work days-no more than 5 per day prn 03/26/20  Yes Kirsteins, Luanna Salk, MD  ibuprofen (ADVIL,MOTRIN) 800 MG tablet Take 800 mg by mouth every 6 (six) hours as needed (pain).  04/19/13  Yes Prueter, Santiago Glad, PA-C  NONFORMULARY OR COMPOUNDED ITEM Use as directed 04/02/20  Yes Felipa Furnace, DPM  nortriptyline (PAMELOR) 10 MG capsule TAKE 3 CAPSULES BY MOUTH EVERY DAY AT BEDTIME 03/26/20  Yes Kirsteins, Luanna Salk, MD  zolpidem (AMBIEN) 5 MG tablet TAKE 1 TABLET BY MOUTH AT BEDTIME AS NEEDED FOR INSOMNIA 04/04/20  Yes Bayard Hugger, NP  albuterol (VENTOLIN HFA) 108 (90 Base) MCG/ACT inhaler Inhale 1-2 puffs into the lungs every 4 (four) hours as needed for up to 7 days for wheezing or shortness of breath. 04/29/20 05/06/20  Laurene Footman B, PA-C  brompheniramine-pseudoephedrine-DM 30-2-10 MG/5ML syrup Take 10 mLs by mouth 4 (four) times daily as needed for up to 7 days. 04/29/20 05/06/20  Danton Clap, PA-C  methocarbamol (ROBAXIN) 500 MG tablet Take 1 tablet (500 mg total) by mouth 2 (two) times daily as needed for muscle spasms. 01/18/20   Bayard Hugger, NP  predniSONE (DELTASONE) 20 MG tablet Take 2 tablets (40 mg total) by mouth daily for 5 days. 04/29/20 05/04/20  Danton Clap, PA-C    Family History Family History  Problem Relation Age of Onset  . Diabetes Mother   . Hypertension Mother   . Cancer Paternal Uncle   . Breast cancer Paternal Uncle        40'S  . Breast cancer Paternal Aunt        80'S    Social History Social History   Tobacco Use  . Smoking status: Former Smoker    Packs/day: 1.00    Years: 20.00    Pack years: 20.00    Types: Cigarettes    Quit date: 01/08/2020    Years since quitting: 0.3  . Smokeless tobacco: Never Used  Vaping Use  . Vaping Use: Never used  Substance Use Topics  . Alcohol use: Yes    Comment: socially  . Drug use: No     Allergies   Penicillins, Sulfa antibiotics, Ondansetron, and Zofran [ondansetron hcl]   Review of Systems Review of Systems  Constitutional: Positive for fatigue. Negative for chills, diaphoresis and fever.  HENT: Positive for congestion and rhinorrhea. Negative for ear pain, sinus pressure, sinus pain and sore throat.        Total loss of smell and taste  Respiratory: Positive for cough.  Negative for shortness of breath.   Gastrointestinal: Negative for abdominal pain, nausea and vomiting.  Musculoskeletal: Negative for arthralgias and myalgias.  Skin: Negative for rash.  Neurological: Positive for headaches. Negative for weakness.  Hematological: Negative for adenopathy.     Physical Exam Triage Vital Signs ED Triage Vitals  Enc Vitals Group     BP 04/29/20 0843 117/88     Pulse Rate  04/29/20 0843 95     Resp 04/29/20 0843 18     Temp 04/29/20 0843 98.1 F (36.7 C)     Temp Source 04/29/20 0843 Oral     SpO2 04/29/20 0843 98 %     Weight 04/29/20 0843 155 lb (70.3 kg)     Height 04/29/20 0843 '5\' 3"'  (1.6 m)     Head Circumference --      Peak Flow --      Pain Score 04/29/20 0842 5     Pain Loc --      Pain Edu? --      Excl. in [place]? --    No data found.  Updated Vital Signs BP 117/88 (BP Location: Right Arm)   Pulse 95   Temp 98.1 F (36.7 C) (Oral)   Resp 18   Ht '5\' 3"'  (1.6 m)   Wt 155 lb (70.3 kg)   LMP 04/26/2020   SpO2 98%   BMI 27.46 kg/m     Physical Exam Vitals and nursing note reviewed.  Constitutional:      General: She is not in acute distress.    Appearance: Normal appearance. She is not ill-appearing or toxic-appearing.  HENT:     Head: Normocephalic and atraumatic.     Nose: Congestion present.     Mouth/Throat:     Mouth: Mucous membranes are moist.     Pharynx: Oropharynx is clear.  Eyes:     General: No scleral icterus.       Right eye: No discharge.        Left eye: No discharge.     Conjunctiva/sclera: Conjunctivae normal.  Cardiovascular:     Rate and Rhythm: Normal rate and regular rhythm.     Heart sounds: Normal heart sounds.  Pulmonary:     Effort: Pulmonary effort is normal. No respiratory distress.     Breath sounds: Normal breath sounds. No wheezing, rhonchi or rales.  Musculoskeletal:     Cervical back: Neck supple.  Skin:    General: Skin is dry.  Neurological:     General: No focal deficit present.       Mental Status: She is alert. Mental status is at baseline.     Motor: No weakness.     Gait: Gait normal.  Psychiatric:        Mood and Affect: Mood normal.        Behavior: Behavior normal.        Thought Content: Thought content normal.      UC Treatments / Results  Labs (all labs ordered are listed, but only abnormal results are displayed) Labs Reviewed  SARS CORONAVIRUS 2 (TAT 6-24 HRS)    EKG   Radiology DG Chest 2 View  Result Date: 04/29/2020 CLINICAL DATA:  Cough EXAM: CHEST - 2 VIEW COMPARISON:  04/27/2019 FINDINGS: The heart size and mediastinal contours are within normal limits. Both lungs are clear. The visualized skeletal structures are unremarkable. IMPRESSION: Normal study. Electronically Signed   By: Rolm Baptise M.D.   On: 04/29/2020 09:03    Procedures ED EKG  Date/Time: 04/29/2020 9:03 AM Performed by: Danton Clap, PA-C Authorized by: Danton Clap, PA-C   ECG reviewed by ED Physician in the absence of a cardiologist: yes   Previous ECG:    Previous ECG:  Unavailable Interpretation:    Interpretation: normal   Rate:    ECG rate:  72   ECG rate assessment: normal   Rhythm:  Rhythm: sinus rhythm   Ectopy:    Ectopy: none   QRS:    QRS axis:  Normal Conduction:    Conduction: normal   ST segments:    ST segments:  Normal T waves:    T waves: normal   Comments:     Normal sinus rhythm and regular rate. Normal EKG   (including critical care time)  Medications Ordered in UC Medications - No data to display  Initial Impression / Assessment and Plan / UC Course  I have reviewed the triage vital signs and the nursing notes.  Pertinent labs & imaging results that were available during my care of the patient were reviewed by me and considered in my medical decision making (see chart for details).   44 year old female presenting for 10-day history of fatigue and loss of smell and taste.  She says that for the past week she has had  a productive cough and some increased chest pressure when she breathes.  She denies shortness of breath or weakness.  Denies palpitations or dizziness.  EKG performed today and bpm 72, normal sinus rate and rhythm.  Normal EKG.  Chest x-ray over read was normal.  I also independently reviewed the chest x-ray.  Covid testing obtained.  Discussed CDC guidelines, isolation protocol and ED precautions with patient.  Advised her she likely has viral bronchitis, possibly due to Covid.  Advised prednisone, ProAir and cough medication as prescribed.  I spoke with her about other causes of chest pain including clots and advised her that if the chest pain or pressure worsens, she has increased fatigue, she has dizziness, she has any breathing difficulty she must call EMS or go immediately to the emergency department.  At this time I estimate there is low risk for PE based on symptoms/clinical presentation and medical history.  Patient is understanding and agreeable.   Final Clinical Impressions(s) / UC Diagnoses   Final diagnoses:  Acute bronchitis, unspecified organism  Cough  Pleuritic pain  Disturbance of smell and taste     Discharge Instructions     Your EKG was normal today. A chest x-ray was also read as normal today there is no sign of pneumonia or consolidated pulmonary disease in your chest. Chest x-ray cannot see if there are any blood clots in your lungs, but your symptoms have been going on for the past week and a half and not acutely worse. All of your vital signs are normal and your chest is clear. Based on your symptoms I suspect you have bronchitis likely due to a virus. Since you have complete loss of your smell and taste it is highly likely that you do have Covid. If your Covid test is positive, your isolation. Was 10 days from symptom onset. If you do have any worsening of the chest discomfort or feel short of breath, you should call EMS or have someone take immediately to the emergency  department for evaluation.  You have received COVID testing today either for positive exposure, concerning symptoms that could be related to COVID infection, screening purposes, or re-testing after confirmed positive.  Your test obtained today checks for active viral infection in the last 1-2 weeks. If your test is negative now, you can still test positive later. So, if you do develop symptoms you should either get re-tested and/or isolate x 10 days. Please follow CDC guidelines.  While Rapid antigen tests come back in 15-20 minutes, send out PCR/molecular test results typically come back within 24  hours. In the mean time, if you are symptomatic, assume this could be a positive test and treat/monitor yourself as if you do have COVID.   We will call with test results. Please download the MyChart app and set up a profile to access test results.   If symptomatic, go home and rest. Push fluids. Take Tylenol as needed for discomfort. Gargle warm salt water. Throat lozenges. Take Mucinex DM or Robitussin for cough. Humidifier in bedroom to ease coughing. Warm showers. Also review the COVID handout for more information.  COVID-19 INFECTION: The incubation period of COVID-19 is approximately 14 days after exposure, with most symptoms developing in roughly 4-5 days. Symptoms may range in severity from mild to critically severe. Roughly 80% of those infected will have mild symptoms. People of any age may become infected with COVID-19 and have the ability to transmit the virus. The most common symptoms include: fever, fatigue, cough, body aches, headaches, sore throat, nasal congestion, shortness of breath, nausea, vomiting, diarrhea, changes in smell and/or taste.    COURSE OF ILLNESS Some patients may begin with mild disease which can progress quickly into critical symptoms. If your symptoms are worsening please call ahead to the Emergency Department and proceed there for further treatment. Recovery time appears  to be roughly 1-2 weeks for mild symptoms and 3-6 weeks for severe disease.   GO IMMEDIATELY TO ER FOR FEVER YOU ARE UNABLE TO GET DOWN WITH TYLENOL, BREATHING PROBLEMS, CHEST PAIN, FATIGUE, LETHARGY, INABILITY TO EAT OR DRINK, ETC  QUARANTINE AND ISOLATION: To help decrease the spread of COVID-19 please remain isolated if you have COVID infection or are highly suspected to have COVID infection. This means -stay home and isolate to one room in the home if you live with others. Do not share a bed or bathroom with others while ill, sanitize and wipe down all countertops and keep common areas clean and disinfected. You may discontinue isolation if you have a mild case and are asymptomatic 10 days after symptom onset as long as you have been fever free >24 hours without having to take Motrin or Tylenol. If your case is more severe (meaning you develop pneumonia or are admitted in the hospital), you may have to isolate longer.   If you have been in close contact (within 6 feet) of someone diagnosed with COVID 19, you are advised to quarantine in your home for 14 days as symptoms can develop anywhere from 2-14 days after exposure to the virus. If you develop symptoms, you  must isolate.  Most current guidelines for COVID after exposure -isolate 10 days if you ARE NOT tested for COVID as long as symptoms do not develop -isolate 7 days if you are tested and remain asymptomatic -You do not necessarily need to be tested for COVID if you have + exposure and        develop   symptoms. Just isolate at home x10 days from symptom onset During this global pandemic, CDC advises to practice social distancing, try to stay at least 76f away from others at all times. Wear a face covering. Wash and sanitize your hands regularly and avoid going anywhere that is not necessary.  KEEP IN MIND THAT THE COVID TEST IS NOT 100% ACCURATE AND YOU SHOULD STILL DO EVERYTHING TO PREVENT POTENTIAL SPREAD OF VIRUS TO OTHERS (WEAR MASK,  WEAR GLOVES, WNorth CaldwellHANDS AND SANITIZE REGULARLY). IF INITIAL TEST IS NEGATIVE, THIS MAY NOT MEAN YOU ARE DEFINITELY NEGATIVE. MOST ACCURATE TESTING IS DONE  5-7 DAYS AFTER EXPOSURE.   It is not advised by CDC to get re-tested after receiving a positive COVID test since you can still test positive for weeks to months after you have already cleared the virus.   *If you have not been vaccinated for COVID, I strongly suggest you consider getting vaccinated as long as there are no contraindications.      ED Prescriptions    Medication Sig Dispense Auth. Provider   predniSONE (DELTASONE) 20 MG tablet Take 2 tablets (40 mg total) by mouth daily for 5 days. 10 tablet Laurene Footman B, PA-C   albuterol (VENTOLIN HFA) 108 (90 Base) MCG/ACT inhaler Inhale 1-2 puffs into the lungs every 4 (four) hours as needed for up to 7 days for wheezing or shortness of breath. 1 g Laurene Footman B, PA-C   brompheniramine-pseudoephedrine-DM 30-2-10 MG/5ML syrup Take 10 mLs by mouth 4 (four) times daily as needed for up to 7 days. 150 mL Danton Clap, PA-C     PDMP not reviewed this encounter.   Danton Clap, PA-C 04/29/20 [phone]

## 2020-04-30 ENCOUNTER — Other Ambulatory Visit: Payer: Self-pay | Admitting: Nurse Practitioner

## 2020-04-30 ENCOUNTER — Encounter: Payer: BC Managed Care – PPO | Attending: Physical Medicine and Rehabilitation | Admitting: Registered Nurse

## 2020-04-30 ENCOUNTER — Encounter: Payer: Self-pay | Admitting: Registered Nurse

## 2020-04-30 VITALS — Ht 63.0 in | Wt 155.0 lb

## 2020-04-30 DIAGNOSIS — M79605 Pain in left leg: Secondary | ICD-10-CM | POA: Insufficient documentation

## 2020-04-30 DIAGNOSIS — Z760 Encounter for issue of repeat prescription: Secondary | ICD-10-CM | POA: Insufficient documentation

## 2020-04-30 DIAGNOSIS — M545 Low back pain: Secondary | ICD-10-CM | POA: Insufficient documentation

## 2020-04-30 DIAGNOSIS — Z9221 Personal history of antineoplastic chemotherapy: Secondary | ICD-10-CM | POA: Insufficient documentation

## 2020-04-30 DIAGNOSIS — M5416 Radiculopathy, lumbar region: Secondary | ICD-10-CM | POA: Diagnosis not present

## 2020-04-30 DIAGNOSIS — M722 Plantar fascial fibromatosis: Secondary | ICD-10-CM | POA: Insufficient documentation

## 2020-04-30 DIAGNOSIS — T451X5S Adverse effect of antineoplastic and immunosuppressive drugs, sequela: Secondary | ICD-10-CM | POA: Insufficient documentation

## 2020-04-30 DIAGNOSIS — G894 Chronic pain syndrome: Secondary | ICD-10-CM

## 2020-04-30 DIAGNOSIS — M961 Postlaminectomy syndrome, not elsewhere classified: Secondary | ICD-10-CM

## 2020-04-30 DIAGNOSIS — F17210 Nicotine dependence, cigarettes, uncomplicated: Secondary | ICD-10-CM | POA: Insufficient documentation

## 2020-04-30 DIAGNOSIS — M79604 Pain in right leg: Secondary | ICD-10-CM | POA: Insufficient documentation

## 2020-04-30 DIAGNOSIS — Z79891 Long term (current) use of opiate analgesic: Secondary | ICD-10-CM

## 2020-04-30 DIAGNOSIS — G620 Drug-induced polyneuropathy: Secondary | ICD-10-CM

## 2020-04-30 DIAGNOSIS — G8929 Other chronic pain: Secondary | ICD-10-CM | POA: Insufficient documentation

## 2020-04-30 DIAGNOSIS — Z5181 Encounter for therapeutic drug level monitoring: Secondary | ICD-10-CM | POA: Diagnosis not present

## 2020-04-30 DIAGNOSIS — T451X5A Adverse effect of antineoplastic and immunosuppressive drugs, initial encounter: Secondary | ICD-10-CM

## 2020-04-30 MED ORDER — HYDROCODONE-ACETAMINOPHEN 10-325 MG PO TABS
1.0000 | ORAL_TABLET | Freq: Four times a day (QID) | ORAL | 0 refills | Status: DC | PRN
Start: 2020-04-30 — End: 2020-05-28

## 2020-04-30 NOTE — Progress Notes (Signed)
Subjective:    Patient ID: Elizabeth Torres, female    DOB: [birth year], 44 y.o.   MRN: [id number]  HPI: Elizabeth Torres is a 44 y.o. female who asked for My-Chart Video Visit due to not feeling well over the last two weeks with HA, Chills and congestion. She also reports she was seen at Urgent Care and had COVID 19 swab on 04/29/2020, awaiting results. Elizabeth Torres agrees with My-Chart Video visit and verbalizes understanding. She states her pain is located in her Lower back radiating into her bilateral lower extremities. She rates her pain 3. Her current exercise regime is walking in her home.   Geryl Rankins CMA asked The Health and History Questions. This provider and Mancel Parsons verified we were speaking with the correct person using two identifiers.   Ms. Feeley Morphine equivalent is 48.33 MME.  Last Oral Swab was Performed on 01/18/2020, it was consistent.    Pain Inventory Average Pain 4 Pain Right Now 3 My pain is constant, dull and aching  In the last 24 hours, has pain interfered with the following? General activity 4 Relation with others 4 Enjoyment of life 3 What TIME of day is your pain at its worst? evening Sleep (in general) Fair  Pain is worse with: walking, standing and some activites Pain improves with: rest and medication Relief from Meds: 5  Family History  Problem Relation Age of Onset  . Diabetes Mother   . Hypertension Mother   . Cancer Paternal Uncle   . Breast cancer Paternal Uncle        40'S  . Breast cancer Paternal Aunt        81'S   Social History   Socioeconomic History  . Marital status: Married    Spouse name: Not on file  . Number of children: Not on file  . Years of education: Not on file  . Highest education level: Not on file  Occupational History  . Not on file  Tobacco Use  . Smoking status: Former Smoker    Packs/day: 1.00    Years: 20.00    Pack years: 20.00    Types: Cigarettes    Quit date: 01/08/2020    Years since quitting:  0.3  . Smokeless tobacco: Never Used  Vaping Use  . Vaping Use: Never used  Substance and Sexual Activity  . Alcohol use: Yes    Comment: socially  . Drug use: No  . Sexual activity: Yes  Other Topics Concern  . Not on file  Social History Narrative  . Not on file   Social Determinants of Health   Financial Resource Strain:   . Difficulty of Paying Living Expenses: Not on file  Food Insecurity:   . Worried About Charity fundraiser in the Last Year: Not on file  . Ran Out of Food in the Last Year: Not on file  Transportation Needs:   . Lack of Transportation (Medical): Not on file  . Lack of Transportation (Non-Medical): Not on file  Physical Activity:   . Days of Exercise per Week: Not on file  . Minutes of Exercise per Session: Not on file  Stress:   . Feeling of Stress : Not on file  Social Connections:   . Frequency of Communication with Friends and Family: Not on file  . Frequency of Social Gatherings with Friends and Family: Not on file  . Attends Religious Services: Not on file  . Active Member of Clubs or Organizations:  Not on file  . Attends Archivist Meetings: Not on file  . Marital Status: Not on file   Past Surgical History:  Procedure Laterality Date  . BREAST BIOPSY Right 07/2011   invasive ductal carcinoma  . 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 RAd and chemo tx  . BREAST SURGERY    . mastectomy Right    partial with lymph node dissection  . PORT A CATH REVISION    . PORT-A-CATH REMOVAL  11-07-15   Dr Bary Castilla  . SHOULDER ARTHROSCOPY WITH ROTATOR CUFF REPAIR Left 05/18/2019   Procedure: SHOULDER ARTHROSCOPY WITH SUBACROMIAL DECOMPRESSION AND DISTAL CLAVICAL EXCISION, INTRAARTICULAR DEBRIDEMENT;  Surgeon: Lovell Sheehan, MD;  Location: [facility];  Service: Orthopedics;  Laterality: Left;  . SPINE SURGERY  2008   Dr Joya Salm  . TUBAL LIGATION     Past Surgical History:  Procedure  Laterality Date  . BREAST BIOPSY Right 07/2011   invasive ductal carcinoma  . 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 RAd and chemo tx  . BREAST SURGERY    . mastectomy Right    partial with lymph node dissection  . PORT A CATH REVISION    . PORT-A-CATH REMOVAL  11-07-15   Dr Bary Castilla  . SHOULDER ARTHROSCOPY WITH ROTATOR CUFF REPAIR Left 05/18/2019   Procedure: SHOULDER ARTHROSCOPY WITH SUBACROMIAL DECOMPRESSION AND DISTAL CLAVICAL EXCISION, INTRAARTICULAR DEBRIDEMENT;  Surgeon: Lovell Sheehan, MD;  Location: [facility];  Service: Orthopedics;  Laterality: Left;  . SPINE SURGERY  2008   Dr Joya Salm  . TUBAL LIGATION     Past Medical History:  Diagnosis Date  . BRCA negative   . Breast cancer ([place]) 2013   RT LUMPECTOMY with chemo and rad tx  . Degenerative disc disease, lumbar   . Personal history of chemotherapy 2013   for breast ca  . Personal history of radiation therapy 2013   F/U right breast cancer   . Radiation 2013   BREAST CA  . Status post chemotherapy 2013   BREAST CA   LMP 04/26/2020   Opioid Risk Score:   Fall Risk Score:  `1  Depression screen PHQ 2/9  Depression screen [facility] 2/9 03/26/2020 02/16/2020 01/26/2020 01/18/2020 04/26/2019 12/15/2018 10/14/2018  Decreased Interest 0 0 0 0 0 0 0  Down, Depressed, Hopeless 0 0 0 0 0 0 0  PHQ - 2 Score 0 0 0 0 0 0 0  Altered sleeping - - 0 - 0 - -  Tired, decreased energy - - 0 - 0 - -  Change in appetite - - 0 - 0 - -  Feeling bad or failure about yourself  - - 0 - 0 - -  Trouble concentrating - - 0 - 0 - -  Moving slowly or fidgety/restless - - 0 - 0 - -  Suicidal thoughts - - 0 - 0 - -  PHQ-9 Score - - 0 - 0 - -  Difficult doing work/chores - - Not difficult at all - - - -  Some recent data might be hidden    Review of Systems  Constitutional: Negative.   HENT: Negative.   Respiratory: Negative.   Cardiovascular: Negative.   Gastrointestinal: Negative.     Genitourinary: Negative.   Musculoskeletal: Positive for back pain.  Skin: Negative.   Allergic/Immunologic: Negative.   Neurological: Negative.   Hematological: Negative.   Psychiatric/Behavioral: Negative.  All other systems reviewed and are negative.      Objective:   Physical Exam Vitals and nursing note reviewed.  Musculoskeletal:     Comments: No Physical Exam Performed: My Chart Video Visit           Assessment & Plan:  1. Lumbar postlaminectomy syndrome status post L5-S1 fusion with chronic S1 radiculopathy.Continuecurrent medication regimen withNortriptyline.04/30/2020. Refilled:Hydrocodone 10/325 mg #145-one every 6 hours as needed for pain, may take an extra tablet on work days only.04/30/2020. We will continue the opioid monitoring program, this consists of regular clinic visits, examinations, urine drug screen, pill counts as well as use of [place] Controlled Substance Reporting system. A 12 month History has been reviewed on the [facility] Today.  2. 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 Insomnia: Continuecurrent medication regimenAmbien.04/30/2020 4.Chronic Left Shoulder Pain:No complaints today: S/PLeft Shoulder Arthroscopy with Rotator Cuff Repair on 05/18/2019 by Dr. Gunnar Fusi: Ortho Following.04/30/2020 5. Muscle Spasm:Continue : Robaxin07/16/2021  F/U in 1 month.  My-Chart Video Visit Established Patient Location of Patient: In Her Home Location of Provider: In the Office

## 2020-04-30 NOTE — Progress Notes (Signed)
Called to discuss with [name] about Covid symptoms and the use of bamlanivimab, a monoclonal antibody infusion for those with mild to moderate Covid symptoms and at a high risk of hospitalization.     Pt does not qualify for infusion therapy as her symptoms first presented > 10 days prior to timing of infusion. Symptoms tier reviewed as well as criteria for ending isolation. P  Patient Active Problem List   Diagnosis Date Noted  . Menorrhagia with irregular cycle 04/02/2020  . Impingement syndrome of left shoulder region 12/15/2018  . Cervical myofascial pain syndrome 02/14/2016  . History of breast cancer 11/07/2015  . Postlaminectomy syndrome, lumbar region 04/12/2012  . Chemotherapy-induced neuropathy ([place]) 04/12/2012  . 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 ([place]) 08/07/2011   Worthy Keeler, DNP, AGNP-c COVID-19 [facility] [phone]

## 2020-05-01 ENCOUNTER — Encounter: Payer: Self-pay | Admitting: Family Medicine

## 2020-05-14 ENCOUNTER — Ambulatory Visit: Payer: BC Managed Care – PPO | Admitting: Podiatry

## 2020-05-15 ENCOUNTER — Telehealth: Payer: Self-pay | Admitting: Registered Nurse

## 2020-05-15 ENCOUNTER — Other Ambulatory Visit: Payer: Self-pay | Admitting: Registered Nurse

## 2020-05-15 MED ORDER — ZOLPIDEM TARTRATE 10 MG PO TABS
ORAL_TABLET | ORAL | 3 refills | Status: DC
Start: 2020-05-15 — End: 2020-11-19

## 2020-05-15 NOTE — Telephone Encounter (Signed)
This provider spoke with Dr Letta Pate regarding Ms. Fees request for increase in Ambient due to increase insomnia after her mother passing. He agrees with plan. Also encouraged Ms. Gonzaga to seek counseling, she states she will call her insurance company. We will continue to monitor.

## 2020-05-28 ENCOUNTER — Encounter: Payer: BC Managed Care – PPO | Attending: Physical Medicine and Rehabilitation | Admitting: Registered Nurse

## 2020-05-28 ENCOUNTER — Other Ambulatory Visit: Payer: Self-pay

## 2020-05-28 ENCOUNTER — Encounter: Payer: Self-pay | Admitting: Registered Nurse

## 2020-05-28 VITALS — BP 117/75 | HR 99 | Temp 98.2°F | Ht 63.0 in | Wt 157.8 lb

## 2020-05-28 DIAGNOSIS — G620 Drug-induced polyneuropathy: Secondary | ICD-10-CM

## 2020-05-28 DIAGNOSIS — Z79891 Long term (current) use of opiate analgesic: Secondary | ICD-10-CM | POA: Insufficient documentation

## 2020-05-28 DIAGNOSIS — Z5181 Encounter for therapeutic drug level monitoring: Secondary | ICD-10-CM | POA: Insufficient documentation

## 2020-05-28 DIAGNOSIS — G894 Chronic pain syndrome: Secondary | ICD-10-CM | POA: Diagnosis not present

## 2020-05-28 DIAGNOSIS — M5416 Radiculopathy, lumbar region: Secondary | ICD-10-CM | POA: Diagnosis not present

## 2020-05-28 DIAGNOSIS — M961 Postlaminectomy syndrome, not elsewhere classified: Secondary | ICD-10-CM | POA: Insufficient documentation

## 2020-05-28 DIAGNOSIS — T451X5A Adverse effect of antineoplastic and immunosuppressive drugs, initial encounter: Secondary | ICD-10-CM | POA: Diagnosis not present

## 2020-05-28 MED ORDER — HYDROCODONE-ACETAMINOPHEN 10-325 MG PO TABS
1.0000 | ORAL_TABLET | Freq: Four times a day (QID) | ORAL | 0 refills | Status: DC | PRN
Start: 2020-05-28 — End: 2020-06-25

## 2020-05-28 NOTE — Progress Notes (Signed)
Subjective:    Patient ID: Elizabeth Torres, female    DOB: 06-09-1976, 44 y.o.   MRN: [id number]  HPI: Elizabeth Torres is a 44 y.o. female who returns for follow up appointment for chronic pain and medication refill. She states her pain is located in her lower back radiating into her bilateral lower extremities. She rates her pain 4. Her current exercise regime is walking and performing stretching exercises.  Elizabeth Torres reports she had a [employer] worker in her left index finger, she states she tried to removed the thorn. Finger with swelling and she reports she's going to Urgent Care  To be evaluated.   Elizabeth Torres Morphine equivalent is 48.33  MME.  Last Oral Swab was Performed on 01/18/2020, it was consistent.   Pain Inventory Average Pain 4 Pain Right Now 4 My pain is constant, dull and aching  In the last 24 hours, has pain interfered with the following? General activity 3 Relation with others 3 Enjoyment of life 3 What TIME of day is your pain at its worst? evening Sleep (in general) Fair  Pain is worse with: sitting, inactivity and standing Pain improves with: rest and medication Relief from Meds: 5  Family History  Problem Relation Age of Onset  . Diabetes Mother   . Hypertension Mother   . Cancer Paternal Uncle   . Breast cancer Paternal Uncle        40'S  . Breast cancer Paternal Aunt        45'S   Social History   Socioeconomic History  . Marital status: Married    Spouse name: Not on file  . Number of children: Not on file  . Years of education: Not on file  . Highest education level: Not on file  Occupational History  . Not on file  Tobacco Use  . Smoking status: Former Smoker    Packs/day: 1.00    Years: 20.00    Pack years: 20.00    Types: Cigarettes    Quit date: 01/08/2020    Years since quitting: 0.3  . Smokeless tobacco: Never Used  Vaping Use  . Vaping Use: Never used  Substance and Sexual Activity  . Alcohol use: Yes    Comment: socially  . Drug  use: No  . Sexual activity: Yes  Other Topics Concern  . Not on file  Social History Narrative  . Not on file   Social Determinants of Health   Financial Resource Strain:   . Difficulty of Paying Living Expenses: Not on file  Food Insecurity:   . Worried About Charity fundraiser in the Last Year: Not on file  . Ran Out of Food in the Last Year: Not on file  Transportation Needs:   . Lack of Transportation (Medical): Not on file  . Lack of Transportation (Non-Medical): Not on file  Physical Activity:   . Days of Exercise per Week: Not on file  . Minutes of Exercise per Session: Not on file  Stress:   . Feeling of Stress : Not on file  Social Connections:   . Frequency of Communication with Friends and Family: Not on file  . Frequency of Social Gatherings with Friends and Family: Not on file  . Attends Religious Services: Not on file  . Active Member of Clubs or Organizations: Not on file  . Attends Archivist Meetings: Not on file  . Marital Status: Not on file   Past Surgical History:  Procedure Laterality Date  . BREAST BIOPSY Right 07/2011   invasive ductal carcinoma  . 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 RAd and chemo tx  . BREAST SURGERY    . mastectomy Right    partial with lymph node dissection  . PORT A CATH REVISION    . PORT-A-CATH REMOVAL  11-07-15   Dr Bary Castilla  . SHOULDER ARTHROSCOPY WITH ROTATOR CUFF REPAIR Left 05/18/2019   Procedure: SHOULDER ARTHROSCOPY WITH SUBACROMIAL DECOMPRESSION AND DISTAL CLAVICAL EXCISION, INTRAARTICULAR DEBRIDEMENT;  Surgeon: Lovell Sheehan, MD;  Location: [facility];  Service: Orthopedics;  Laterality: Left;  . SPINE SURGERY  2008   Dr Joya Salm  . TUBAL LIGATION     Past Surgical History:  Procedure Laterality Date  . BREAST BIOPSY Right 07/2011   invasive ductal carcinoma  . 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  RAd and chemo tx  . BREAST SURGERY    . mastectomy Right    partial with lymph node dissection  . PORT A CATH REVISION    . PORT-A-CATH REMOVAL  11-07-15   Dr Bary Castilla  . SHOULDER ARTHROSCOPY WITH ROTATOR CUFF REPAIR Left 05/18/2019   Procedure: SHOULDER ARTHROSCOPY WITH SUBACROMIAL DECOMPRESSION AND DISTAL CLAVICAL EXCISION, INTRAARTICULAR DEBRIDEMENT;  Surgeon: Lovell Sheehan, MD;  Location: [facility];  Service: Orthopedics;  Laterality: Left;  . SPINE SURGERY  2008   Dr Joya Salm  . TUBAL LIGATION     Past Medical History:  Diagnosis Date  . BRCA negative   . Breast cancer ([place]) 2013   RT LUMPECTOMY with chemo and rad tx  . Degenerative disc disease, lumbar   . Personal history of chemotherapy 2013   for breast ca  . Personal history of radiation therapy 2013   F/U right breast cancer   . Radiation 2013   BREAST CA  . Status post chemotherapy 2013   BREAST CA   BP 117/75   Pulse 99   Temp 98.2 F (36.8 C)   Ht '5\' 3"'  (1.6 m)   Wt 157 lb 12.8 oz (71.6 kg)   SpO2 95%   BMI 27.95 kg/m   Opioid Risk Score:   Fall Risk Score:  `1  Depression screen PHQ 2/9  Depression screen [facility] 2/9 05/28/2020 03/26/2020 02/16/2020 01/26/2020 01/18/2020 04/26/2019 12/15/2018  Decreased Interest 1 0 0 0 0 0 0  Down, Depressed, Hopeless 1 0 0 0 0 0 0  PHQ - 2 Score 2 0 0 0 0 0 0  Altered sleeping - - - 0 - 0 -  Tired, decreased energy - - - 0 - 0 -  Change in appetite - - - 0 - 0 -  Feeling bad or failure about yourself  - - - 0 - 0 -  Trouble concentrating - - - 0 - 0 -  Moving slowly or fidgety/restless - - - 0 - 0 -  Suicidal thoughts - - - 0 - 0 -  PHQ-9 Score - - - 0 - 0 -  Difficult doing work/chores - - - Not difficult at all - - -  Some recent data might be hidden    Review of Systems  Musculoskeletal: Positive for back pain. Negative for gait problem.       Leg pain  All other systems reviewed and are negative.      Objective:   Physical Exam Vitals and nursing  note reviewed.  Constitutional:      Appearance: Normal appearance.  Cardiovascular:     Rate and Rhythm: Normal rate and regular rhythm.     Pulses: Normal pulses.     Heart sounds: Normal heart sounds.  Pulmonary:     Effort: Pulmonary effort is normal.     Breath sounds: Normal breath sounds.  Musculoskeletal:     Cervical back: Normal range of motion and neck supple.     Comments: Normal Muscle Bulk and Muscle Testing Reveals:  Upper Extremities: Full ROM and Muscle Strength 5/5   Lower Extremities: Full ROM and Muscle Strength 5/5 Arises from chair with ease Narrow Based Gait   Skin:    General: Skin is warm and dry.  Neurological:     Mental Status: She is alert and oriented to person, place, and time.  Psychiatric:        Mood and Affect: Mood normal.        Behavior: Behavior normal.           Assessment & Plan:  1. Lumbar postlaminectomy syndrome status post L5-S1 fusion with chronic S1 radiculopathy.Continuecurrent medication regimen withNortriptyline.05/28/2020. Refilled:Hydrocodone 10/325 mg #145-one every 6 hours as needed for pain, may take an extra tablet on work days only.05/28/2020. We will continue the opioid monitoring program, this consists of regular clinic visits, examinations, urine drug screen, pill counts as well as use of [place] Controlled Substance Reporting system. A 12 month History has been reviewed on the [place] on 05/28/2020. 2. 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 Insomnia: Continuecurrent medication regimenAmbien.05/28/2020 4.Chronic Left Shoulder Pain:No complaints today: S/PLeft Shoulder Arthroscopy with Rotator Cuff Repair on 05/18/2019 by Dr. Gunnar Fusi: Ortho Following.05/28/2020   F/U in 1 month.  20 minutes of face to face patient care time was spent during this  visit. All questions were encouraged and answered.

## 2020-06-18 ENCOUNTER — Telehealth: Payer: Self-pay | Admitting: Registered Nurse

## 2020-06-18 NOTE — Telephone Encounter (Signed)
Return Elizabeth Torres call, Ambien was refill on 05/15/2020 with refills. The above message was left for Elizabeth Torres.

## 2020-06-25 ENCOUNTER — Other Ambulatory Visit: Payer: Self-pay

## 2020-06-25 ENCOUNTER — Encounter: Payer: BC Managed Care – PPO | Attending: Physical Medicine and Rehabilitation | Admitting: Registered Nurse

## 2020-06-25 ENCOUNTER — Encounter: Payer: Self-pay | Admitting: Registered Nurse

## 2020-06-25 VITALS — BP 101/72 | HR 103 | Temp 98.3°F | Ht 63.0 in | Wt 159.4 lb

## 2020-06-25 DIAGNOSIS — M542 Cervicalgia: Secondary | ICD-10-CM | POA: Diagnosis not present

## 2020-06-25 DIAGNOSIS — Z5181 Encounter for therapeutic drug level monitoring: Secondary | ICD-10-CM | POA: Diagnosis not present

## 2020-06-25 DIAGNOSIS — G894 Chronic pain syndrome: Secondary | ICD-10-CM | POA: Diagnosis not present

## 2020-06-25 DIAGNOSIS — M961 Postlaminectomy syndrome, not elsewhere classified: Secondary | ICD-10-CM | POA: Insufficient documentation

## 2020-06-25 DIAGNOSIS — Z79891 Long term (current) use of opiate analgesic: Secondary | ICD-10-CM | POA: Diagnosis not present

## 2020-06-25 DIAGNOSIS — M5412 Radiculopathy, cervical region: Secondary | ICD-10-CM | POA: Insufficient documentation

## 2020-06-25 DIAGNOSIS — M7918 Myalgia, other site: Secondary | ICD-10-CM | POA: Insufficient documentation

## 2020-06-25 DIAGNOSIS — M5416 Radiculopathy, lumbar region: Secondary | ICD-10-CM | POA: Diagnosis not present

## 2020-06-25 MED ORDER — HYDROCODONE-ACETAMINOPHEN 10-325 MG PO TABS
1.0000 | ORAL_TABLET | Freq: Four times a day (QID) | ORAL | 0 refills | Status: DC | PRN
Start: 2020-06-25 — End: 2020-08-21

## 2020-06-25 MED ORDER — HYDROCODONE-ACETAMINOPHEN 10-325 MG PO TABS
1.0000 | ORAL_TABLET | Freq: Four times a day (QID) | ORAL | 0 refills | Status: DC | PRN
Start: 2020-06-25 — End: 2020-06-25

## 2020-06-25 NOTE — Progress Notes (Signed)
Subjective:    Patient ID: Elizabeth Torres, female    DOB: Oct 24, 1975, 44 y.o.   MRN: [id number]  HPI: Elizabeth Torres is a 44 y.o. female who returns for follow up appointment for chronic pain and medication refill. She states her pain is located in her neck radiating into her bilateral shoulders and lower back pain radiating into her bilateral lower extremities. She rates her pain 4. Her current exercise regime is walking and performing stretching exercises.  Elizabeth Torres Morphine equivalent is 48.33 MME.    Last Oral Swab was Performed on 01/18/2020, it was consistent.   Pain Inventory Average Pain 4 Pain Right Now 4 My pain is constant, dull and aching  In the last 24 hours, has pain interfered with the following? General activity 4 Relation with others 4 Enjoyment of life 4 What TIME of day is your pain at its worst? daytime Sleep (in general) Fair  Pain is worse with: inactivity, standing and some activites Pain improves with: rest, heat/ice and medication Relief from Meds: 4  Family History  Problem Relation Age of Onset  . Diabetes Mother   . Hypertension Mother   . Cancer Paternal Uncle   . Breast cancer Paternal Uncle        40'S  . Breast cancer Paternal Aunt        11'S   Social History   Socioeconomic History  . Marital status: Married    Spouse name: Not on file  . Number of children: Not on file  . Years of education: Not on file  . Highest education level: Not on file  Occupational History  . Not on file  Tobacco Use  . Smoking status: Former Smoker    Packs/day: 1.00    Years: 20.00    Pack years: 20.00    Types: Cigarettes    Quit date: 01/08/2020    Years since quitting: 0.4  . Smokeless tobacco: Never Used  Vaping Use  . Vaping Use: Never used  Substance and Sexual Activity  . Alcohol use: Yes    Comment: socially  . Drug use: No  . Sexual activity: Yes  Other Topics Concern  . Not on file  Social History Narrative  . Not on file   Social  Determinants of Health   Financial Resource Strain:   . Difficulty of Paying Living Expenses: Not on file  Food Insecurity:   . Worried About Charity fundraiser in the Last Year: Not on file  . Ran Out of Food in the Last Year: Not on file  Transportation Needs:   . Lack of Transportation (Medical): Not on file  . Lack of Transportation (Non-Medical): Not on file  Physical Activity:   . Days of Exercise per Week: Not on file  . Minutes of Exercise per Session: Not on file  Stress:   . Feeling of Stress : Not on file  Social Connections:   . Frequency of Communication with Friends and Family: Not on file  . Frequency of Social Gatherings with Friends and Family: Not on file  . Attends Religious Services: Not on file  . Active Member of Clubs or Organizations: Not on file  . Attends Archivist Meetings: Not on file  . Marital Status: Not on file   Past Surgical History:  Procedure Laterality Date  . BREAST BIOPSY Right 07/2011   invasive ductal carcinoma  . 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 Margins  clear. RAd and chemo tx  . BREAST SURGERY    . mastectomy Right    partial with lymph node dissection  . PORT A CATH REVISION    . PORT-A-CATH REMOVAL  11-07-15   Dr Bary Castilla  . SHOULDER ARTHROSCOPY WITH ROTATOR CUFF REPAIR Left 05/18/2019   Procedure: SHOULDER ARTHROSCOPY WITH SUBACROMIAL DECOMPRESSION AND DISTAL CLAVICAL EXCISION, INTRAARTICULAR DEBRIDEMENT;  Surgeon: Lovell Sheehan, MD;  Location: [facility];  Service: Orthopedics;  Laterality: Left;  . SPINE SURGERY  2008   Dr Joya Salm  . TUBAL LIGATION     Past Surgical History:  Procedure Laterality Date  . BREAST BIOPSY Right 07/2011   invasive ductal carcinoma  . 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 RAd and chemo tx  . BREAST SURGERY    . mastectomy Right    partial with lymph node dissection  . PORT A CATH REVISION      . PORT-A-CATH REMOVAL  11-07-15   Dr Bary Castilla  . SHOULDER ARTHROSCOPY WITH ROTATOR CUFF REPAIR Left 05/18/2019   Procedure: SHOULDER ARTHROSCOPY WITH SUBACROMIAL DECOMPRESSION AND DISTAL CLAVICAL EXCISION, INTRAARTICULAR DEBRIDEMENT;  Surgeon: Lovell Sheehan, MD;  Location: [place];  Service: Orthopedics;  Laterality: Left;  . SPINE SURGERY  2008   Dr Joya Salm  . TUBAL LIGATION     Past Medical History:  Diagnosis Date  . BRCA negative   . Breast cancer ([place]) 2013   RT LUMPECTOMY with chemo and rad tx  . Degenerative disc disease, lumbar   . Personal history of chemotherapy 2013   for breast ca  . Personal history of radiation therapy 2013   F/U right breast cancer   . Radiation 2013   BREAST CA  . Status post chemotherapy 2013   BREAST CA   BP 101/72   Pulse (!) 103   Temp 98.3 F (36.8 C)   Ht '5\' 3"'  (1.6 m)   Wt 159 lb 6.4 oz (72.3 kg)   SpO2 96%   BMI 28.24 kg/m   Opioid Risk Score:   Fall Risk Score:  `1  Depression screen PHQ 2/9  Depression screen [facility] 2/9 05/28/2020 03/26/2020 02/16/2020 01/26/2020 01/18/2020 04/26/2019 12/15/2018  Decreased Interest 1 0 0 0 0 0 0  Down, Depressed, Hopeless 1 0 0 0 0 0 0  PHQ - 2 Score 2 0 0 0 0 0 0  Altered sleeping - - - 0 - 0 -  Tired, decreased energy - - - 0 - 0 -  Change in appetite - - - 0 - 0 -  Feeling bad or failure about yourself  - - - 0 - 0 -  Trouble concentrating - - - 0 - 0 -  Moving slowly or fidgety/restless - - - 0 - 0 -  Suicidal thoughts - - - 0 - 0 -  PHQ-9 Score - - - 0 - 0 -  Difficult doing work/chores - - - Not difficult at all - - -  Some recent data might be hidden     Review of Systems  Musculoskeletal: Positive for back pain.       Shoulder pain Leg pain  All other systems reviewed and are negative.      Objective:   Physical Exam Vitals and nursing note reviewed.  Constitutional:      Appearance: Normal appearance.  Neck:     Comments: Cervical  Hypersensitivity Cardiovascular:     Rate and Rhythm: Normal  rate and regular rhythm.     Pulses: Normal pulses.     Heart sounds: Normal heart sounds.  Pulmonary:     Effort: Pulmonary effort is normal.     Breath sounds: Normal breath sounds.  Musculoskeletal:     Cervical back: Normal range of motion and neck supple.     Comments: Normal Muscle Bulk and Muscle Testing Reveals:  Upper Extremities: Full ROM and Muscle Strength 5/5  Lower Extremities: Full ROM and Muscle Strength 5/5  Arises from chair with ease  Narrow Based Gait   Skin:    General: Skin is warm and dry.  Neurological:     Mental Status: She is alert and oriented to person, place, and time.  Psychiatric:        Mood and Affect: Mood normal.        Behavior: Behavior normal.           Assessment & Plan:  1. Lumbar postlaminectomy syndrome status post L5-S1 fusion with chronic S1 radiculopathy.Continuecurrent medication regimen withNortriptyline.06/25/2020. Refilled:Hydrocodone 10/325 mg #145-one every 6 hours as needed for pain, may take an extra tablet on work days only.06/25/2020. We will continue the opioid monitoring program, this consists of regular clinic visits, examinations, urine drug screen, pill counts as well as use of [place] Controlled Substance Reporting system. A 12 month History has been reviewed on the [place] on 06/25/2020. 2. 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 Insomnia: Continuecurrent medication regimenAmbien.06/25/2020 4.Chronic Left Shoulder Pain:No complaints today: S/PLeft Shoulder Arthroscopy with Rotator Cuff Repair on 05/18/2019 by Dr. Gunnar Fusi: Ortho Following.06/25/2020 5. Cervicalgia/ Cervical Radiculitis: Continue current medication regimen. Continue to monitor.   F/U in 1 month.

## 2020-07-08 ENCOUNTER — Encounter: Payer: Self-pay | Admitting: Family Medicine

## 2020-07-08 ENCOUNTER — Other Ambulatory Visit: Payer: Self-pay

## 2020-07-08 ENCOUNTER — Ambulatory Visit
Admission: EM | Admit: 2020-07-08 | Discharge: 2020-07-08 | Disposition: A | Discharge status: Home / Self Care | Payer: BC Managed Care – PPO

## 2020-07-08 ENCOUNTER — Ambulatory Visit (INDEPENDENT_AMBULATORY_CARE_PROVIDER_SITE_OTHER): Payer: BC Managed Care – PPO

## 2020-07-08 ENCOUNTER — Encounter: Payer: Self-pay | Admitting: Emergency Medicine

## 2020-07-08 DIAGNOSIS — R058 Other specified cough: Secondary | ICD-10-CM

## 2020-07-08 DIAGNOSIS — R059 Cough, unspecified: Secondary | ICD-10-CM | POA: Diagnosis not present

## 2020-07-08 DIAGNOSIS — R0602 Shortness of breath: Secondary | ICD-10-CM

## 2020-07-08 DIAGNOSIS — J40 Bronchitis, not specified as acute or chronic: Secondary | ICD-10-CM | POA: Diagnosis not present

## 2020-07-08 MED ORDER — AEROCHAMBER MV MISC: 2 refills | Status: DC

## 2020-07-08 MED ORDER — PROMETHAZINE-DM 6.25-15 MG/5ML PO SYRP: 5.0000 mL | ORAL_SOLUTION | Freq: Four times a day (QID) | ORAL | 0 refills | Status: DC | PRN

## 2020-07-08 MED ORDER — PREDNISONE 10 MG PO TABS: 20.0000 mg | ORAL_TABLET | Freq: Every day | ORAL | 0 refills | Status: AC

## 2020-07-08 MED ORDER — BENZONATATE 100 MG PO CAPS: 200.0000 mg | ORAL_CAPSULE | Freq: Three times a day (TID) | ORAL | 0 refills | Status: DC

## 2020-07-08 NOTE — ED Provider Notes (Signed)
[facility]    CSN: [id number] Arrival date & time: 07/08/20  0800      History   Chief Complaint Chief Complaint  Patient presents with  . Cough  . chest congestion  . Nasal Congestion    HPI Elizabeth Torres is a 44 y.o. female.   HPI   44 year old female here for evaluation of cough, chest congestion, nasal congestion.  Patient states her symptoms have been going on for over a week.  She had associated shortness of breath and wheezing some green nasal discharge and green sputum production from her cough some ear pressure.  Patient states that her husband has the same symptoms minus the respiratory component.  She states he was tested for Covid and was negative.  Patient denies fever, sore throat, body aches, changes to taste or smell, or GI complaints.  Patient is not received her Covid or flu vaccine.  Past Medical History:  Diagnosis Date  . BRCA negative   . Breast cancer ([place]) 2013   RT LUMPECTOMY with chemo and rad tx  . Degenerative disc disease, lumbar   . Personal history of chemotherapy 2013   for breast ca  . Personal history of radiation therapy 2013   F/U right breast cancer   . Radiation 2013   BREAST CA  . Status post chemotherapy 2013   BREAST CA    Patient Active Problem List   Diagnosis Date Noted  . Menorrhagia with irregular cycle 04/02/2020  . Impingement syndrome of left shoulder region 12/15/2018  . Cervical myofascial pain syndrome 02/14/2016  . History of breast cancer 11/07/2015  . Postlaminectomy syndrome, lumbar region 04/12/2012  . 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 ([place]) 08/07/2011    Past Surgical History:  Procedure Laterality Date  . BREAST BIOPSY Right 07/2011   invasive ductal carcinoma  . 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 RAd and chemo tx  . BREAST SURGERY    . mastectomy Right     partial with lymph node dissection  . PORT A CATH REVISION    . PORT-A-CATH REMOVAL  11-07-15   Dr Bary Castilla  . SHOULDER ARTHROSCOPY WITH ROTATOR CUFF REPAIR Left 05/18/2019   Procedure: SHOULDER ARTHROSCOPY WITH SUBACROMIAL DECOMPRESSION AND DISTAL CLAVICAL EXCISION, INTRAARTICULAR DEBRIDEMENT;  Surgeon: Lovell Sheehan, MD;  Location: [facility];  Service: Orthopedics;  Laterality: Left;  . SPINE SURGERY  2008   Dr Joya Salm  . TUBAL LIGATION      OB History    Gravida  3   Para      Term      Preterm      AB      Living  3     SAB      TAB      Ectopic      Multiple      Live Births               Home Medications    Prior to Admission medications   Medication Sig Start Date End Date Taking? Authorizing Provider  albuterol (VENTOLIN HFA) 108 (90 Base) MCG/ACT inhaler Inhale 1-2 puffs into the lungs every 4 (four) hours as needed for up to 7 days for wheezing or shortness of breath. 04/29/20 07/08/20 Yes Danton Clap, PA-C  hydrochlorothiazide (HYDRODIURIL) 12.5 MG tablet Take 1 tablet (12.5 mg total) by mouth daily. 01/26/20  Yes  Juline Patch, MD  HYDROcodone-acetaminophen (NORCO) 10-325 MG tablet Take 1 tablet by mouth every 6 (six) hours as needed. May take an extra tablet on work days-no more than 5 per day prn 06/25/20  Yes Bayard Hugger, NP  ibuprofen (ADVIL,MOTRIN) 800 MG tablet Take 800 mg by mouth every 6 (six) hours as needed (pain).  04/19/13  Yes Prueter, Santiago Glad, PA-C  NONFORMULARY OR COMPOUNDED ITEM Use as directed 04/02/20  Yes Felipa Furnace, DPM  nortriptyline (PAMELOR) 10 MG capsule TAKE 3 CAPSULES BY MOUTH EVERY DAY AT BEDTIME 03/26/20  Yes Kirsteins, Luanna Salk, MD  UNABLE TO FIND Muscle relaxer. Doesn't know the name of it   Yes [provider]  zolpidem (AMBIEN) 10 MG tablet TAKE 1 TABLET BY MOUTH AT BEDTIME AS NEEDED FOR INSOMNIA 05/15/20  Yes Bayard Hugger, NP  benzonatate (TESSALON) 100 MG capsule Take 2 capsules (200 mg  total) by mouth every 8 (eight) hours. 07/08/20   Margarette Canada, NP  predniSONE (DELTASONE) 10 MG tablet Take 2 tablets (20 mg total) by mouth daily for 5 days. 07/08/20 07/13/20  Margarette Canada, NP  promethazine-dextromethorphan (PROMETHAZINE-DM) 6.25-15 MG/5ML syrup Take 5 mLs by mouth 4 (four) times daily as needed. 07/08/20   Margarette Canada, NP  Spacer/Aero-Holding Josiah Lobo (AEROCHAMBER MV) inhaler Use as instructed 07/08/20   Margarette Canada, NP    Family History Family History  Problem Relation Age of Onset  . Diabetes Mother   . Hypertension Mother   . Pulmonary fibrosis Mother   . Cancer Paternal Uncle   . Breast cancer Paternal Uncle        40'S  . Breast cancer Paternal Aunt        60'S  . Other Father        unknown medical history    Social History Social History   Tobacco Use  . Smoking status: Former Smoker    Packs/day: 1.00    Years: 20.00    Pack years: 20.00    Types: Cigarettes    Quit date: 01/08/2020    Years since quitting: 0.4  . Smokeless tobacco: Never Used  Vaping Use  . Vaping Use: Never used  Substance Use Topics  . Alcohol use: Not Currently    Comment: socially  . Drug use: No     Allergies   Penicillins, Sulfa antibiotics, Ondansetron, and Zofran [ondansetron hcl]   Review of Systems Review of Systems  Constitutional: Negative for activity change, appetite change and fever.  HENT: Positive for congestion, ear pain, postnasal drip, rhinorrhea, sinus pressure and sinus pain. Negative for sore throat.   Respiratory: Positive for shortness of breath and wheezing.   Cardiovascular: Negative for chest pain.  Gastrointestinal: Negative for abdominal pain, diarrhea, nausea and vomiting.  Musculoskeletal: Negative for arthralgias and myalgias.  Skin: Negative for rash.  Hematological: Negative.   Psychiatric/Behavioral: Negative.      Physical Exam Triage Vital Signs ED Triage Vitals  Enc Vitals Group     BP      Pulse      Resp      Temp       Temp src      SpO2      Weight      Height      Head Circumference      Peak Flow      Pain Score      Pain Loc      Pain Edu?  Excl. in [place]?    No data found.  Updated Vital Signs BP (!) 124/95 (BP Location: Left Arm)   Pulse 99   Temp 97.8 F (36.6 C) (Oral)   Resp 18   Ht _0  (1.676 m)   Wt 155 lb (70.3 kg)   LMP 06/05/2020 (Approximate) Comment: Ablation  SpO2 100%   BMI 25.02 kg/m   Visual Acuity Right Eye Distance:   Left Eye Distance:   Bilateral Distance:    Right Eye Near:   Left Eye Near:    Bilateral Near:     Physical Exam Vitals and nursing note reviewed.  Constitutional:      General: She is not in acute distress.    Appearance: Normal appearance. She is normal weight. She is ill-appearing. She is not toxic-appearing.  HENT:     Head: Normocephalic and atraumatic.     Right Ear: Tympanic membrane, ear canal and external ear normal. There is no impacted cerumen.     Left Ear: Tympanic membrane, ear canal and external ear normal. There is no impacted cerumen.     Nose: Congestion and rhinorrhea present.     Comments: Nasal mucosa is erythematous and edematous with thick yellow discharge clinging to the walls of the septum bilaterally.    Mouth/Throat:     Mouth: Mucous membranes are moist.     Pharynx: Oropharynx is clear. Posterior oropharyngeal erythema present. No oropharyngeal exudate.     Comments: Posterior oropharynx has mild erythema and yellow postnasal drip. Eyes:     General: No scleral icterus.    Extraocular Movements: Extraocular movements intact.     Conjunctiva/sclera: Conjunctivae normal.     Pupils: Pupils are equal, round, and reactive to light.  Cardiovascular:     Rate and Rhythm: Normal rate and regular rhythm.     Pulses: Normal pulses.     Heart sounds: Normal heart sounds. No murmur heard.  No gallop.   Pulmonary:     Effort: Pulmonary effort is normal.     Breath sounds: Rhonchi present. No wheezing or rales.       Comments: Patient has fine rhonchi in middle and lower lobes bilaterally. Musculoskeletal:        General: No swelling or tenderness. Normal range of motion.     Cervical back: Normal range of motion and neck supple.  Lymphadenopathy:     Cervical: No cervical adenopathy.  Skin:    General: Skin is warm and dry.     Capillary Refill: Capillary refill takes less than 2 seconds.     Findings: No erythema or rash.  Neurological:     General: No focal deficit present.     Mental Status: She is alert and oriented to person, place, and time.  Psychiatric:        Mood and Affect: Mood normal.        Behavior: Behavior normal.        Thought Content: Thought content normal.        Judgment: Judgment normal.      UC Treatments / Results  Labs (all labs ordered are listed, but only abnormal results are displayed) Labs Reviewed - No data to display  EKG   Radiology DG Chest 2 View  Result Date: 07/08/2020 CLINICAL DATA:  productive cough and SOB EXAM: CHEST - 2 VIEW COMPARISON:  04/29/2020 chest radiograph and prior. FINDINGS: The heart size and mediastinal contours are within normal limits. Both lungs are clear. No pneumothorax or  pleural effusion. No acute osseous abnormality. IMPRESSION: No focal airspace disease. Electronically Signed   By: Primitivo Gauze M.D.   On: 07/08/2020 08:49    Procedures Procedures (including critical care time)  Medications Ordered in UC Medications - No data to display  Initial Impression / Assessment and Plan / UC Course  I have reviewed the triage vital signs and the nursing notes.  Pertinent labs & imaging results that were available during my care of the patient were reviewed by me and considered in my medical decision making (see chart for details).   Patient is here for evaluation of cough and congestion has been going on over a week.  Patient has not had a fever.  She has had a productive cough nasal discharge that she describes as  green.  Physical exam reveals inflamed nasal mucosa with yellow discharge dried in both naris.  Yellow postnasal drip as well.  Lung sounds are decreased bilaterally with rhonchi in the middle and lower lobes.  Patient also has maxillary sinus tenderness to percussion.  Patient reports a history of pneumonia and she received in Pneumovax in January 2021.  Patient was evaluated for bronchitis in September of this year.  Will obtain CXR.  Chest x-ray is negative for pneumonia or acute airspace disease.  We will treat patient for bronchitis.  Patient has albuterol inhaler but she is not using a spacer.  Instructed patient on the use of spacer and rationale for its use.  Will send prescription for spacer over to pharmacy along with prescription for prednisone, Tessalon Perles, and Promethazine DM.  Patient is taking hydrocodone 10 tablets every 6 hours which she says is not helping her cough.   Final Clinical Impressions(s) / UC Diagnoses   Final diagnoses:  Bronchitis     Discharge Instructions     Use your albuterol inhaler, 2 puffs every 4-6 hours with the spacer, as needed for shortness of breath and wheezing.  Take the prednisone, 40 mg, once daily with food.  Use the Tessalon Perles during the day for cough.  Take them with a small sip of water.  They may give you a metallic taste to your mouth and numbness to the base of your tongue which is normal.  Use the Promethazine DM every 6 hours as needed for cough and congestion.  This will make you drowsy so I recommend you save it for bedtime.  If your symptoms continue follow-up with your primary care provider.    ED Prescriptions    Medication Sig Dispense Auth. Provider   Spacer/Aero-Holding Chambers (AEROCHAMBER MV) inhaler Use as instructed 1 each Margarette Canada, NP   benzonatate (TESSALON) 100 MG capsule Take 2 capsules (200 mg total) by mouth every 8 (eight) hours. 21 capsule Margarette Canada, NP   promethazine-dextromethorphan  (PROMETHAZINE-DM) 6.25-15 MG/5ML syrup Take 5 mLs by mouth 4 (four) times daily as needed. 118 mL Margarette Canada, NP   predniSONE (DELTASONE) 10 MG tablet Take 2 tablets (20 mg total) by mouth daily for 5 days. 10 tablet Margarette Canada, NP     I have reviewed the PDMP during this encounter.   Margarette Canada, NP 07/08/20 [phone]

## 2020-07-08 NOTE — Discharge Instructions (Signed)
Use your albuterol inhaler, 2 puffs every 4-6 hours with the spacer, as needed for shortness of breath and wheezing.  Take the prednisone, 40 mg, once daily with food.  Use the Tessalon Perles during the day for cough.  Take them with a small sip of water.  They may give you a metallic taste to your mouth and numbness to the base of your tongue which is normal.  Use the Promethazine DM every 6 hours as needed for cough and congestion.  This will make you drowsy so I recommend you save it for bedtime.  If your symptoms continue follow-up with your primary care provider.

## 2020-07-08 NOTE — ED Triage Notes (Addendum)
Patient in today c/o cough, chest congestion, nasal congestion and pain when she takes a deep breath. Patient denies fever. Patient has taken OTC Dayquil, Nyquil, Theraflu and Ibuprofen. Patient has not had the covid vaccines. Patient declines covid testing today.

## 2020-08-06 ENCOUNTER — Ambulatory Visit: Payer: BC Managed Care – PPO | Admitting: Registered Nurse

## 2020-08-08 IMAGING — CR DG CHEST 2V
2 series · 2 of 2 positions shown · non-contrast
Comparison: PA and lateral chest 10/09/2017.

CLINICAL DATA: Preoperative examination in patient for shoulder
surgery.

EXAM:
CHEST - 2 VIEW

[chest pa]
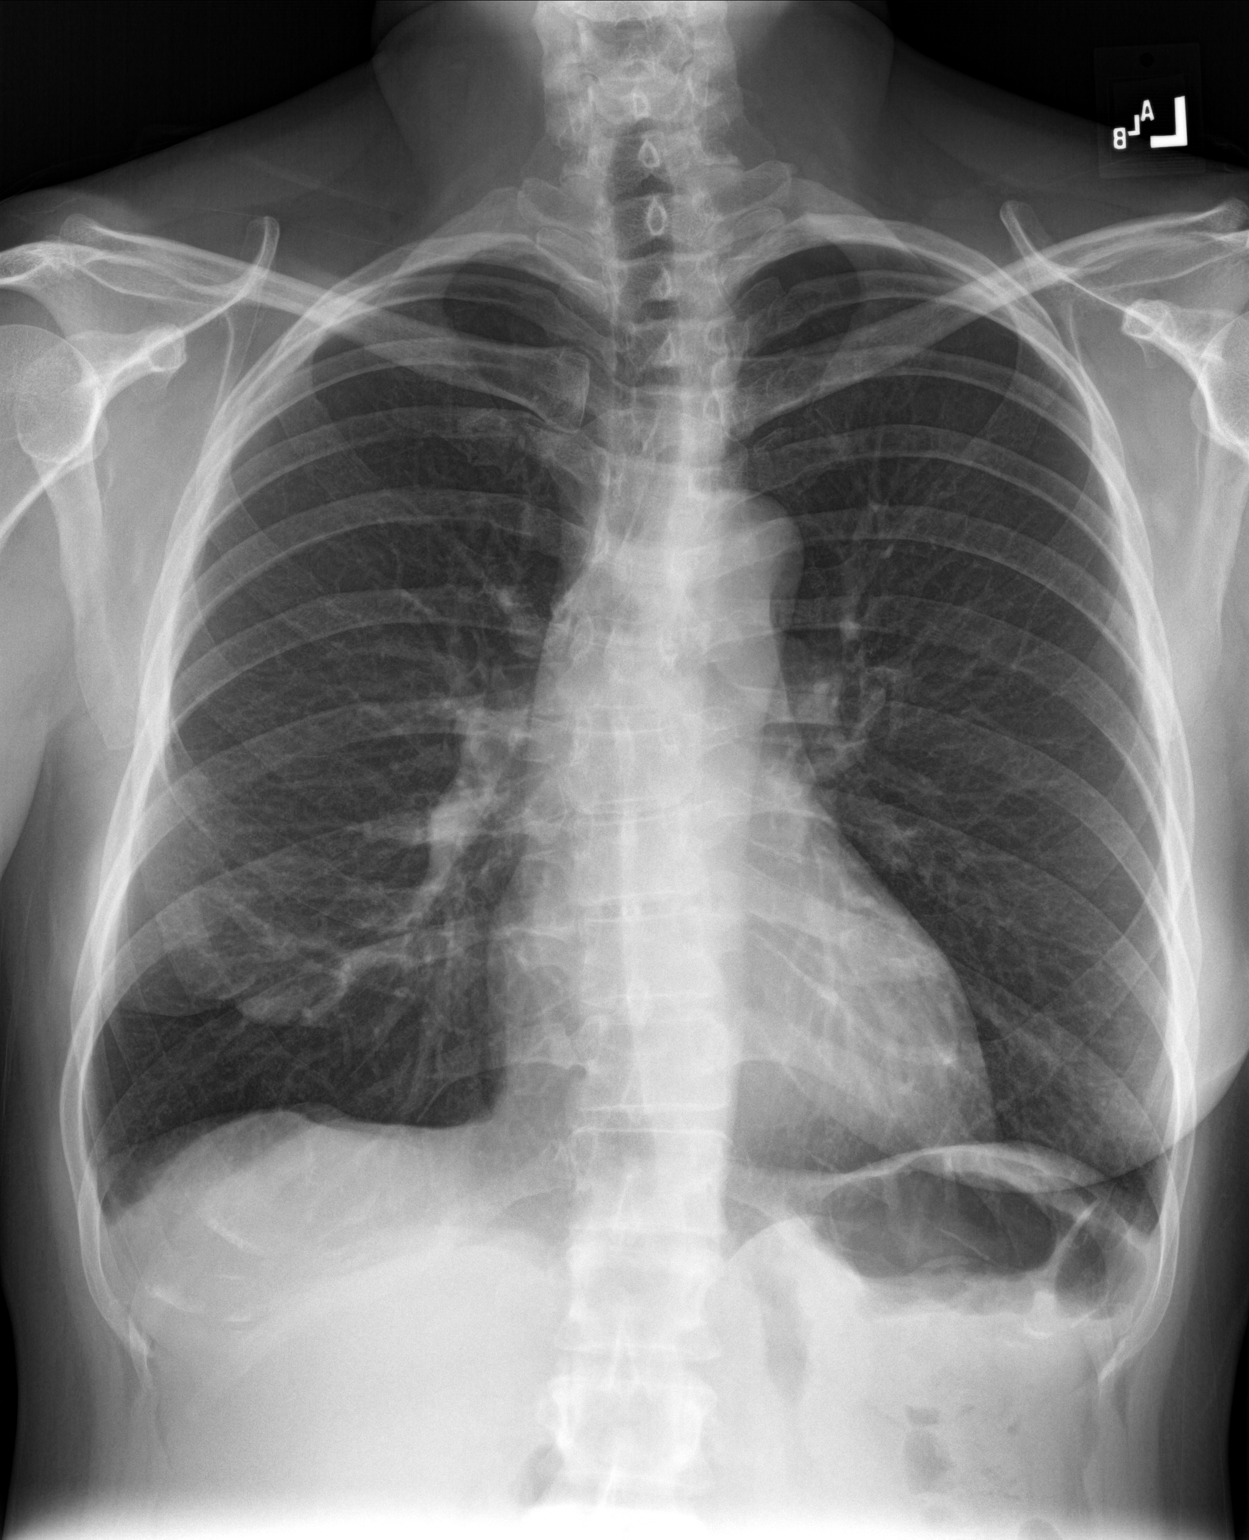

[chest lat]
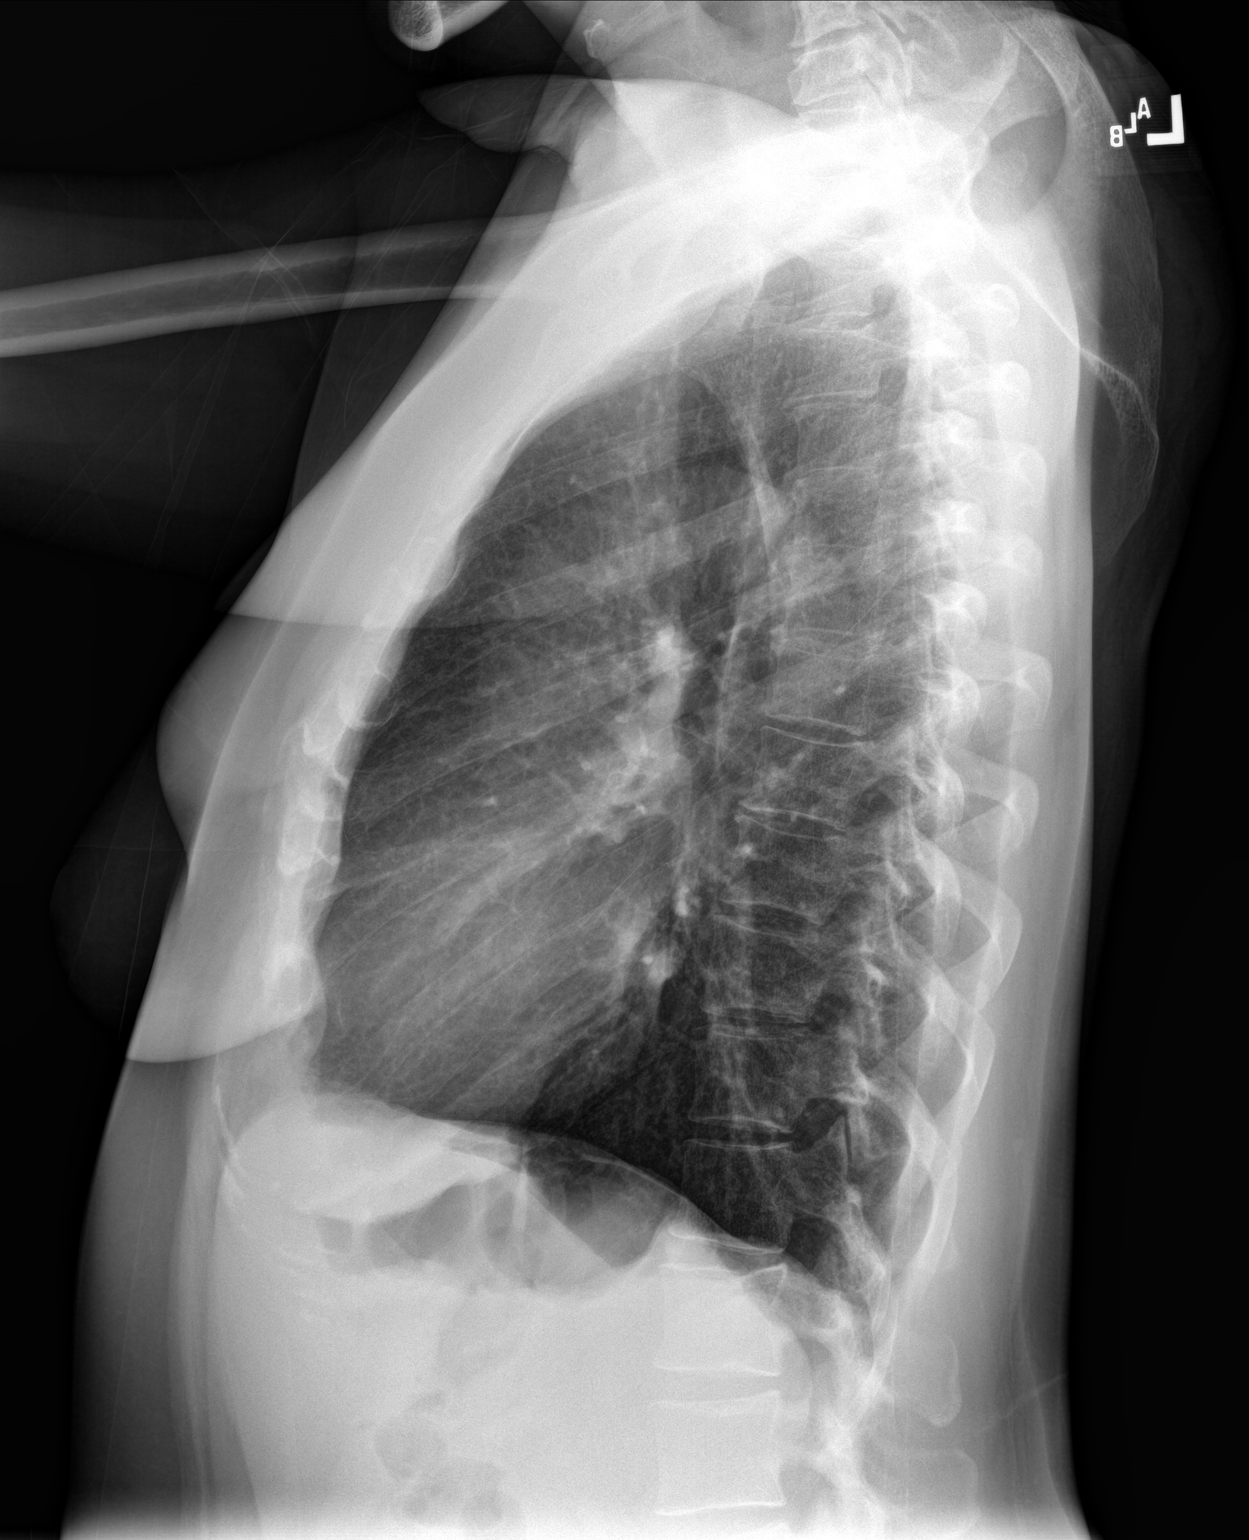

[2 of 2 positions shown; findings below may reference images not displayed]

FINDINGS: Mild residual scar in the right middle lobe from pneumonia seen on
the prior study is noted. Lungs are otherwise clear. No pneumothorax
or pleural effusion. Heart size is normal. No acute or focal bony
abnormality.
IMPRESSION: No acute disease.

## 2020-08-20 ENCOUNTER — Encounter: Payer: BC Managed Care – PPO | Admitting: Registered Nurse

## 2020-08-21 ENCOUNTER — Encounter: Payer: BC Managed Care – PPO | Attending: Physical Medicine and Rehabilitation | Admitting: Registered Nurse

## 2020-08-21 ENCOUNTER — Encounter: Payer: Self-pay | Admitting: Registered Nurse

## 2020-08-21 ENCOUNTER — Other Ambulatory Visit: Payer: Self-pay

## 2020-08-21 VITALS — BP 105/76 | HR 100 | Temp 99.0°F | Ht 64.0 in | Wt 155.0 lb

## 2020-08-21 DIAGNOSIS — M5412 Radiculopathy, cervical region: Secondary | ICD-10-CM | POA: Insufficient documentation

## 2020-08-21 DIAGNOSIS — M961 Postlaminectomy syndrome, not elsewhere classified: Secondary | ICD-10-CM | POA: Insufficient documentation

## 2020-08-21 DIAGNOSIS — G894 Chronic pain syndrome: Secondary | ICD-10-CM | POA: Diagnosis not present

## 2020-08-21 DIAGNOSIS — Z5181 Encounter for therapeutic drug level monitoring: Secondary | ICD-10-CM | POA: Diagnosis not present

## 2020-08-21 DIAGNOSIS — M5416 Radiculopathy, lumbar region: Secondary | ICD-10-CM | POA: Insufficient documentation

## 2020-08-21 DIAGNOSIS — T451X5A Adverse effect of antineoplastic and immunosuppressive drugs, initial encounter: Secondary | ICD-10-CM | POA: Insufficient documentation

## 2020-08-21 DIAGNOSIS — G620 Drug-induced polyneuropathy: Secondary | ICD-10-CM | POA: Insufficient documentation

## 2020-08-21 DIAGNOSIS — Z79891 Long term (current) use of opiate analgesic: Secondary | ICD-10-CM | POA: Insufficient documentation

## 2020-08-21 DIAGNOSIS — M542 Cervicalgia: Secondary | ICD-10-CM | POA: Insufficient documentation

## 2020-08-21 MED ORDER — HYDROCODONE-ACETAMINOPHEN 10-325 MG PO TABS
1.0000 | ORAL_TABLET | Freq: Four times a day (QID) | ORAL | 0 refills | Status: DC | PRN
Start: 2020-08-21 — End: 2020-09-30

## 2020-08-21 NOTE — Progress Notes (Signed)
Subjective:    Patient ID: Elizabeth Torres, female    DOB: January 29, 1976, 45 y.o.   MRN: [id number]  HPI: Elizabeth Torres is a 45 y.o. female whose appointment was changed to a My-Chart visit, she called office with complaints of COVID-19 symptoms such as cough, sore throat and rhinorrhea. Elizabeth Torres also stated her daughter has been diagnosed with COVID-19. Elizabeth Torres agrees with My-Chart visit and verbalizes understanding. She states her  pain is located in her neck radiating into her bilateral shoulders and upper extremities and at times bilateral hands with tingling and burning. Also reports lower back pain radiating into her bilateral lower extremities. Elizabeth Torres will be calling her surgeon to schedule an office appointment, once she feels better.  She rates her pain 4. Her current exercise regime is walking in her home.  Elizabeth Torres Morphine equivalent is 48.33 MME.    Last Oral Swab was Performed on 01/18/2020, it was consistent.   Pain Inventory Average Pain 4 Pain Right Now 4 My pain is constant, dull and aching  In the last 24 hours, has pain interfered with the following? General activity 0 Relation with others 0 Enjoyment of life 0 What TIME of day is your pain at its worst? daytime Sleep (in general) Fair  Pain is worse with: walking, standing and some activites Pain improves with: rest, heat/ice and medication Relief from Meds: 7  Family History  Problem Relation Age of Onset  . Diabetes Mother   . Hypertension Mother   . Pulmonary fibrosis Mother   . Cancer Paternal Uncle   . Breast cancer Paternal Uncle        40'S  . Breast cancer Paternal Aunt        60'S  . Other Father        unknown medical history   Social History   Socioeconomic History  . Marital status: Married    Spouse name: Not on file  . Number of children: Not on file  . Years of education: Not on file  . Highest education level: Not on file  Occupational History  . Not on file  Tobacco Use  . Smoking  status: Former Smoker    Packs/day: 1.00    Years: 20.00    Pack years: 20.00    Types: Cigarettes    Quit date: 01/08/2020    Years since quitting: 0.6  . Smokeless tobacco: Never Used  Vaping Use  . Vaping Use: Never used  Substance and Sexual Activity  . Alcohol use: Not Currently    Comment: socially  . Drug use: No  . Sexual activity: Yes  Other Topics Concern  . Not on file  Social History Narrative  . Not on file   Social Determinants of Health   Financial Resource Strain: Not on file  Food Insecurity: Not on file  Transportation Needs: Not on file  Physical Activity: Not on file  Stress: Not on file  Social Connections: Not on file   Past Surgical History:  Procedure Laterality Date  . BREAST BIOPSY Right 07/2011   invasive ductal carcinoma  . 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 RAd and chemo tx  . BREAST SURGERY    . mastectomy Right    partial with lymph node dissection  . PORT A CATH REVISION    . PORT-A-CATH REMOVAL  11-07-15   Dr Bary Castilla  . SHOULDER ARTHROSCOPY WITH ROTATOR CUFF REPAIR Left 05/18/2019  Procedure: SHOULDER ARTHROSCOPY WITH SUBACROMIAL DECOMPRESSION AND DISTAL CLAVICAL EXCISION, INTRAARTICULAR DEBRIDEMENT;  Surgeon: Lyndle Herrlich, MD;  Location: [facility];  Service: Orthopedics;  Laterality: Left;  . SPINE SURGERY  2008   Dr Jeral Fruit  . TUBAL LIGATION     Past Surgical History:  Procedure Laterality Date  . BREAST BIOPSY Right 07/2011   invasive ductal carcinoma  . 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 RAd and chemo tx  . BREAST SURGERY    . mastectomy Right    partial with lymph node dissection  . PORT A CATH REVISION    . PORT-A-CATH REMOVAL  11-07-15   Dr Lemar Livings  . SHOULDER ARTHROSCOPY WITH ROTATOR CUFF REPAIR Left 05/18/2019   Procedure: SHOULDER ARTHROSCOPY WITH SUBACROMIAL DECOMPRESSION AND DISTAL CLAVICAL EXCISION,  INTRAARTICULAR DEBRIDEMENT;  Surgeon: Lyndle Herrlich, MD;  Location: [facility];  Service: Orthopedics;  Laterality: Left;  . SPINE SURGERY  2008   Dr Jeral Fruit  . TUBAL LIGATION     Past Medical History:  Diagnosis Date  . BRCA negative   . Breast cancer ([facility]) 2013   RT LUMPECTOMY with chemo and rad tx  . Degenerative disc disease, lumbar   . Personal history of chemotherapy 2013   for breast ca  . Personal history of radiation therapy 2013   F/U right breast cancer   . Radiation 2013   BREAST CA  . Status post chemotherapy 2013   BREAST CA   BP 105/76 Comment: pt reported  Pulse 100 Comment: pt reported  Temp 99 F (37.2 C) Comment: pt reported  Ht 5\' 4"  (1.626 m) Comment: pt reported  Wt 155 lb (70.3 kg) Comment: pt reported  BMI 26.61 kg/m   Opioid Risk Score:   Fall Risk Score:  `1  Depression screen PHQ 2/9  Depression screen [facility] 2/9 08/21/2020 05/28/2020 03/26/2020 02/16/2020 01/26/2020 01/18/2020 04/26/2019  Decreased Interest 0 1 0 0 0 0 0  Down, Depressed, Hopeless 0 1 0 0 0 0 0  PHQ - 2 Score 0 2 0 0 0 0 0  Altered sleeping - - - - 0 - 0  Tired, decreased energy - - - - 0 - 0  Change in appetite - - - - 0 - 0  Feeling bad or failure about yourself  - - - - 0 - 0  Trouble concentrating - - - - 0 - 0  Moving slowly or fidgety/restless - - - - 0 - 0  Suicidal thoughts - - - - 0 - 0  PHQ-9 Score - - - - 0 - 0  Difficult doing work/chores - - - - Not difficult at all - -  Some recent data might be hidden    Review of Systems  Constitutional: Negative.   HENT: Negative.   Eyes: Negative.   Respiratory: Negative.   Cardiovascular: Negative.   Gastrointestinal: Negative.   Endocrine: Negative.   Genitourinary: Negative.   Musculoskeletal: Positive for back pain.  Skin: Negative.   Allergic/Immunologic: Negative.   Neurological: Negative.   Hematological: Negative.   Psychiatric/Behavioral: Negative.   All other systems reviewed and are  negative.      Objective:   Physical Exam Vitals and nursing note reviewed.  Musculoskeletal:     Comments: No Physical Exam Performed: My-Chart Video Visit           Assessment & Plan:  1. Lumbar postlaminectomy syndrome status post L5-S1 fusion with chronic  S1 radiculopathy.Continuecurrent medication regimen withNortriptyline.08/21/2020. Refilled:Hydrocodone 10/325 mg #145-one every 6 hours as needed for pain, may take an extra tablet on work days only.08/21/2020. We will continue the opioid monitoring program, this consists of regular clinic visits, examinations, urine drug screen, pill counts as well as use of [place] Controlled Substance Reporting system. A 12 month History has been reviewed on the [place] Controlled Substance Reporting Systemon 08/21/2020. 2. 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 Insomnia: Continuecurrent medication regimenAmbien.08/21/2020 4.Chronic Left Shoulder Pain:No complaints today: S/PLeft Shoulder Arthroscopy with Rotator Cuff Repair on 05/18/2019 by Dr. Gunnar Fusi: Ortho Following.08/21/2020 5. Cervicalgia/ Cervical Radiculitis: Continue current medication regimen. Continue to monitor. She will schedule an appointment with her neurologist. 08/21/2020  F/U in 1 month.  My- Chart Video Visit Establish Patient Location of Patient: In Her Home Location of Provider: In the Office

## 2020-08-26 DIAGNOSIS — M5412 Radiculopathy, cervical region: Secondary | ICD-10-CM | POA: Diagnosis not present

## 2020-08-26 DIAGNOSIS — Z6827 Body mass index (BMI) 27.0-27.9, adult: Secondary | ICD-10-CM | POA: Diagnosis not present

## 2020-08-26 DIAGNOSIS — R030 Elevated blood-pressure reading, without diagnosis of hypertension: Secondary | ICD-10-CM | POA: Insufficient documentation

## 2020-09-24 ENCOUNTER — Encounter: Payer: BC Managed Care – PPO | Admitting: Registered Nurse

## 2020-09-30 ENCOUNTER — Other Ambulatory Visit: Payer: Self-pay

## 2020-09-30 ENCOUNTER — Encounter: Payer: Self-pay | Admitting: Registered Nurse

## 2020-09-30 ENCOUNTER — Encounter: Payer: BC Managed Care – PPO | Attending: Physical Medicine and Rehabilitation | Admitting: Registered Nurse

## 2020-09-30 VITALS — BP 135/88 | HR 91 | Temp 98.9°F | Ht 64.0 in | Wt 161.8 lb

## 2020-09-30 DIAGNOSIS — T451X5A Adverse effect of antineoplastic and immunosuppressive drugs, initial encounter: Secondary | ICD-10-CM | POA: Diagnosis not present

## 2020-09-30 DIAGNOSIS — Z5181 Encounter for therapeutic drug level monitoring: Secondary | ICD-10-CM | POA: Diagnosis not present

## 2020-09-30 DIAGNOSIS — M5416 Radiculopathy, lumbar region: Secondary | ICD-10-CM

## 2020-09-30 DIAGNOSIS — Z79891 Long term (current) use of opiate analgesic: Secondary | ICD-10-CM | POA: Diagnosis not present

## 2020-09-30 DIAGNOSIS — G620 Drug-induced polyneuropathy: Secondary | ICD-10-CM | POA: Diagnosis not present

## 2020-09-30 DIAGNOSIS — M961 Postlaminectomy syndrome, not elsewhere classified: Secondary | ICD-10-CM

## 2020-09-30 DIAGNOSIS — M542 Cervicalgia: Secondary | ICD-10-CM | POA: Diagnosis not present

## 2020-09-30 DIAGNOSIS — M7918 Myalgia, other site: Secondary | ICD-10-CM | POA: Diagnosis not present

## 2020-09-30 DIAGNOSIS — G894 Chronic pain syndrome: Secondary | ICD-10-CM | POA: Diagnosis not present

## 2020-09-30 MED ORDER — HYDROCODONE-ACETAMINOPHEN 10-325 MG PO TABS
1.0000 | ORAL_TABLET | Freq: Four times a day (QID) | ORAL | 0 refills | Status: DC | PRN
Start: 2020-09-30 — End: 2020-10-30

## 2020-09-30 NOTE — Progress Notes (Signed)
 Subjective:    Patient ID: Elizabeth Torres, female    DOB: 09/01/1975, 45 y.o.   MRN: [id number]  HPI: Elizabeth Torres is a 45 y.o. female who returns for follow up appointment for chronic pain and medication refill. She states her  pain is located in her neck and lower back pain radiating into her bilateral lower extremities. She rates her pain 4. Her current exercise regime is walking and performing stretching exercises.  Ms. Halt Morphine equivalent is 48.33 MME.  UDS today.   Pain Inventory Average Pain 4 Pain Right Now 4 My pain is constant, dull and aching  In the last 24 hours, has pain interfered with the following? General activity 4 Relation with others 3 Enjoyment of life 3 What TIME of day is your pain at its worst? evening Sleep (in general) Fair  Pain is worse with: sitting, inactivity, standing and some activites Pain improves with: rest, heat/ice and medication Relief from Meds: 7  Family History  Problem Relation Age of Onset  . Diabetes Mother   . Hypertension Mother   . Pulmonary fibrosis Mother   . Cancer Paternal Uncle   . Breast cancer Paternal Uncle        40'S  . Breast cancer Paternal Aunt        60'S  . Other Father        unknown medical history   Social History   Socioeconomic History  . Marital status: Married    Spouse name: Not on file  . Number of children: Not on file  . Years of education: Not on file  . Highest education level: Not on file  Occupational History  . Not on file  Tobacco Use  . Smoking status: Former Smoker    Packs/day: 1.00    Years: 20.00    Pack years: 20.00    Types: Cigarettes    Quit date: 01/08/2020    Years since quitting: 0.7  . Smokeless tobacco: Never Used  Vaping Use  . Vaping Use: Never used  Substance and Sexual Activity  . Alcohol use: Not Currently    Comment: socially  . Drug use: No  . Sexual activity: Yes  Other Topics Concern  . Not on file  Social History Narrative  . Not on file    Social Determinants of Health   Financial Resource Strain: Not on file  Food Insecurity: Not on file  Transportation Needs: Not on file  Physical Activity: Not on file  Stress: Not on file  Social Connections: Not on file   Past Surgical History:  Procedure Laterality Date  . BREAST BIOPSY Right 07/2011   invasive ductal carcinoma  . 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 RAd and chemo tx  . BREAST SURGERY    . mastectomy Right    partial with lymph node dissection  . PORT A CATH REVISION    . PORT-A-CATH REMOVAL  11-07-15   Dr Byrnett  . SHOULDER ARTHROSCOPY WITH ROTATOR CUFF REPAIR Left 05/18/2019   Procedure: SHOULDER ARTHROSCOPY WITH SUBACROMIAL DECOMPRESSION AND DISTAL CLAVICAL EXCISION, INTRAARTICULAR DEBRIDEMENT;  Surgeon: Bowers, James R, MD;  Location: [facility];  Service: Orthopedics;  Laterality: Left;  . SPINE SURGERY  2008   Dr Botero  . TUBAL LIGATION     Past Surgical History:  Procedure Laterality Date  . BREAST BIOPSY Right 07/2011   invasive ductal carcinoma  . BREAST LUMPECTOMY Right 08/2011     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 RAd and chemo tx  . BREAST SURGERY    . mastectomy Right    partial with lymph node dissection  . PORT A CATH REVISION    . PORT-A-CATH REMOVAL  11-07-15   Dr Byrnett  . SHOULDER ARTHROSCOPY WITH ROTATOR CUFF REPAIR Left 05/18/2019   Procedure: SHOULDER ARTHROSCOPY WITH SUBACROMIAL DECOMPRESSION AND DISTAL CLAVICAL EXCISION, INTRAARTICULAR DEBRIDEMENT;  Surgeon: Bowers, James R, MD;  Location: [facility];  Service: Orthopedics;  Laterality: Left;  . SPINE SURGERY  2008   Dr Botero  . TUBAL LIGATION     Past Medical History:  Diagnosis Date  . BRCA negative   . Breast cancer (H[facility]) 2013   RT LUMPECTOMY with chemo and rad tx  . Degenerative disc disease, lumbar   . Personal history of chemotherapy 2013   for breast ca  . Personal history of  radiation therapy 2013   F/U right breast cancer   . Radiation 2013   BREAST CA  . Status post chemotherapy 2013   BREAST CA   There were no vitals taken for this visit.  Opioid Risk Score:   Fall Risk Score:  `1  Depression screen PHQ 2/9  Depression screen PHQ 2/9 08/21/2020 05/28/2020 03/26/2020 02/16/2020 01/26/2020 01/18/2020 04/26/2019  Decreased Interest 0 1 0 0 0 0 0  Down, Depressed, Hopeless 0 1 0 0 0 0 0  PHQ - 2 Score 0 2 0 0 0 0 0  Altered sleeping - - - - 0 - 0  Tired, decreased energy - - - - 0 - 0  Change in appetite - - - - 0 - 0  Feeling bad or failure about yourself  - - - - 0 - 0  Trouble concentrating - - - - 0 - 0  Moving slowly or fidgety/restless - - - - 0 - 0  Suicidal thoughts - - - - 0 - 0  PHQ-9 Score - - - - 0 - 0  Difficult doing work/chores - - - - Not difficult at all - -  Some recent data might be hidden   Review of Systems  Musculoskeletal: Positive for back pain and neck pain. Negative for gait problem.       Left side of neck & both legs are in pain  All other systems reviewed and are negative.      Objective:   Physical Exam Vitals and nursing note reviewed.  Constitutional:      Appearance: Normal appearance.  Cardiovascular:     Rate and Rhythm: Normal rate and regular rhythm.     Pulses: Normal pulses.     Heart sounds: Normal heart sounds.  Pulmonary:     Effort: Pulmonary effort is normal.     Breath sounds: Normal breath sounds.  Musculoskeletal:     Cervical back: Normal range of motion and neck supple.     Comments: Normal Muscle Bulk and Muscle Testing Reveals:  Upper Extremities: Full ROM and Muscle Strength 5/5   No Lumbar Paraspinal Tenderness  Lower Extremities: Full ROM and Muscle Strength 5/5 Arises from chair with ease Narrow Based Gait   Skin:    General: Skin is warm and dry.  Neurological:     Mental Status: She is alert and oriented to person, place, and time.  Psychiatric:        Mood and Affect: Mood  normal.        Behavior: Behavior normal.             Assessment & Plan:  1. Lumbar postlaminectomy syndrome status post L5-S1 fusion with chronic S1 radiculopathy.Continuecurrent medication regimen withNortriptyline.09/30/2020. Refilled:Hydrocodone 10/325 mg #145-one every 6 hours as needed for pain, may take an extra tablet on work days only.09/30/2020. We will continue the opioid monitoring program, this consists of regular clinic visits, examinations, urine drug screen, pill counts as well as use of [place] Controlled Substance Reporting system. A 12 month History has been reviewed on the [place] Controlled Substance Reporting Systemon 09/30/2020. 2. 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 Insomnia: Continuecurrent medication regimenAmbien.09/30/2020 4.Chronic Left Shoulder Pain:No complaints today: S/PLeft Shoulder Arthroscopy with Rotator Cuff Repair on 05/18/2019 by Dr. Bowersr: Ortho Following.09/30/2020 5. Cervicalgia/ Cervical Radiculitis: Continue current medication regimen. Continue to monitor.She will schedule an appointment with her neurologist. 09/30/2020  F/U in 1 month. 

## 2020-10-08 ENCOUNTER — Ambulatory Visit (INDEPENDENT_AMBULATORY_CARE_PROVIDER_SITE_OTHER): Payer: BC Managed Care – PPO | Admitting: Podiatry

## 2020-10-08 ENCOUNTER — Encounter: Payer: Self-pay | Admitting: Podiatry

## 2020-10-08 ENCOUNTER — Other Ambulatory Visit: Payer: Self-pay

## 2020-10-08 ENCOUNTER — Telehealth: Payer: Self-pay | Admitting: *Deleted

## 2020-10-08 DIAGNOSIS — Q666 Other congenital valgus deformities of feet: Secondary | ICD-10-CM | POA: Diagnosis not present

## 2020-10-08 DIAGNOSIS — M722 Plantar fascial fibromatosis: Secondary | ICD-10-CM

## 2020-10-08 LAB — TOXASSURE SELECT,+ANTIDEPR,UR

## 2020-10-08 NOTE — Progress Notes (Signed)
Subjective:  Patient ID: Elizabeth Torres, female    DOB: [birth year],  MRN: [id number]  Chief Complaint  Patient presents with  . Plantar Fasciitis    "my left foot is great, but my right arch is still painful"    45 y.o. female presents with the above complaint.  Patient presents with complaint of right heel pain.  Patient states that it started hurting a lot over the past few weeks.  Patient was treated in the past with me for bilateral plantar fasciitis.  However she did not follow-up due to various familial obligations.  She is not here for the right side mostly the left side is 100% healed.  She would like to discuss getting another steroid shot and other options.  She also has brought her ankle brace which seems to have gotten worn out.  She is also interested in orthotics.   Review of Systems: Negative except as noted in the HPI. Denies N/V/F/Ch.  Past Medical History:  Diagnosis Date  . BRCA negative   . Breast cancer ([place]) 2013   RT LUMPECTOMY with chemo and rad tx  . Degenerative disc disease, lumbar   . Personal history of chemotherapy 2013   for breast ca  . Personal history of radiation therapy 2013   F/U right breast cancer   . Radiation 2013   BREAST CA  . Status post chemotherapy 2013   BREAST CA    Current Outpatient Medications:  .  hydrochlorothiazide (HYDRODIURIL) 12.5 MG tablet, Take 1 tablet (12.5 mg total) by mouth daily., Disp: 90 tablet, Rfl: 3 .  HYDROcodone-acetaminophen (NORCO) 10-325 MG tablet, Take 1 tablet by mouth every 6 (six) hours as needed. May take an extra tablet on work days-no more than 5 per day prn, Disp: 145 tablet, Rfl: 0 .  ibuprofen (ADVIL,MOTRIN) 800 MG tablet, Take 800 mg by mouth every 6 (six) hours as needed (pain). , Disp: , Rfl:  .  NONFORMULARY OR COMPOUNDED ITEM, Use as directed, Disp: 120 each, Rfl: 1 .  nortriptyline (PAMELOR) 10 MG capsule, TAKE 3 CAPSULES BY MOUTH EVERY DAY AT BEDTIME, Disp: 180 capsule, Rfl: 3 .   Spacer/Aero-Holding Chambers (AEROCHAMBER MV) inhaler, Use as instructed, Disp: 1 each, Rfl: 2 .  UNABLE TO FIND, Muscle relaxer. Doesn't know the name of it, Disp: , Rfl:  .  zolpidem (AMBIEN) 10 MG tablet, TAKE 1 TABLET BY MOUTH AT BEDTIME AS NEEDED FOR INSOMNIA, Disp: 30 tablet, Rfl: 3  Social History   Tobacco Use  Smoking Status Former Smoker  . Packs/day: 1.00  . Years: 20.00  . Pack years: 20.00  . Types: Cigarettes  . Quit date: 01/08/2020  . Years since quitting: 0.7  Smokeless Tobacco Never Used    Allergies  Allergen Reactions  . Penicillins Anaphylaxis  . Sulfa Antibiotics Anaphylaxis  . Ondansetron     migraines  . Other   . Zofran [Ondansetron Hcl]     migraines   Objective:  There were no vitals filed for this visit. There is no height or weight on file to calculate BMI. Constitutional Well developed. Well nourished.  Vascular Dorsalis pedis pulses palpable bilaterally. Posterior tibial pulses palpable bilaterally. Capillary refill normal to all digits.  No cyanosis or clubbing noted. Pedal hair growth normal.  Neurologic Normal speech. Oriented to person, place, and time. Epicritic sensation to light touch grossly present bilaterally.  Dermatologic Nails well groomed and normal in appearance. No open wounds. No skin lesions.  Orthopedic: Normal joint  ROM without pain or crepitus bilaterally. No visible deformities. Tender to palpation at the calcaneal tuber right. No pain with calcaneal squeeze right. Ankle ROM diminished range of motion right. Silfverskiold Test: positive right.   Radiographs: Review of prior radiographs show small heel spur to the posterior heel.  Assessment:   1. Pes planovalgus   2. Plantar fasciitis of right foot    Plan:  Patient was evaluated and treated and all questions answered.  Plantar Fasciitis, right - XR reviewed as above.  - Educated on icing and stretching. Instructions given.  - Injection delivered to the  plantar fascia as below. - DME: Plantar Fascial Brace - Pharmacologic management: None  Pes planovalgus -I explained the patient the etiology of pes planovalgus emergency room and options were extensively discussed.  Given the patient has chronically recurrent plantar fasciitis in the setting of not wearing orthotics.  I believe patient will benefit from custom-made orthotics to help control the hindfoot motion and support the arch of the foot take the stress away from the plantar fascial.  Patient states understanding and will obtain orthotics -Should be scheduled to see Liliane Channel for orthotics  Procedure: Injection Tendon/Ligament Location: Right plantar fascia at the glabrous junction; medial approach. Skin Prep: alcohol Injectate: 0.5 cc 0.5% marcaine plain, 0.5 cc of 1% Lidocaine, 0.5 cc kenalog 10. Disposition: Patient tolerated procedure well. Injection site dressed with a band-aid.  No follow-ups on file.

## 2020-10-08 NOTE — Telephone Encounter (Signed)
Urine drug screen for this encounter is consistent for prescribed medication 

## 2020-10-22 ENCOUNTER — Ambulatory Visit (INDEPENDENT_AMBULATORY_CARE_PROVIDER_SITE_OTHER): Payer: BC Managed Care – PPO | Admitting: Family Medicine

## 2020-10-22 ENCOUNTER — Other Ambulatory Visit: Payer: Self-pay

## 2020-10-22 ENCOUNTER — Encounter: Payer: Self-pay | Admitting: Family Medicine

## 2020-10-22 VITALS — BP 120/80 | HR 80 | Ht 64.0 in | Wt 161.0 lb

## 2020-10-22 DIAGNOSIS — I10 Essential (primary) hypertension: Secondary | ICD-10-CM

## 2020-10-22 DIAGNOSIS — D1722 Benign lipomatous neoplasm of skin and subcutaneous tissue of left arm: Secondary | ICD-10-CM | POA: Diagnosis not present

## 2020-10-22 DIAGNOSIS — E663 Overweight: Secondary | ICD-10-CM | POA: Diagnosis not present

## 2020-10-22 MED ORDER — HYDROCHLOROTHIAZIDE 12.5 MG PO TABS: 12.5000 mg | ORAL_TABLET | Freq: Every day | ORAL | 1 refills | Status: DC

## 2020-10-22 NOTE — Progress Notes (Signed)
Date:  10/22/2020   Name:  Elizabeth Torres   DOB:  [birth year]   MRN:  [id number]   Chief Complaint: Hypertension  Hypertension This is a chronic problem. The current episode started more than 1 year ago. The problem has been gradually improving since onset. The problem is controlled. Pertinent negatives include no anxiety, blurred vision, chest pain, headaches, malaise/fatigue, neck pain, orthopnea, palpitations, peripheral edema, PND, shortness of breath or sweats. Risk factors for coronary artery disease include dyslipidemia. Past treatments include diuretics. The current treatment provides moderate improvement. There are no compliance problems.  There is no history of angina, kidney disease, CAD/MI, heart failure, left ventricular hypertrophy, PVD or retinopathy. There is no history of chronic renal disease, a hypertension causing med or renovascular disease.    Lab Results  Component Value Date   CREATININE 0.70 01/26/2020   BUN 11 01/26/2020   NA 138 01/26/2020   K 4.6 01/26/2020   CL 102 01/26/2020   CO2 23 01/26/2020   Lab Results  Component Value Date   CHOL 145 01/26/2020   HDL 52 01/26/2020   LDLCALC 80 01/26/2020   TRIG 66 01/26/2020   No results found for: TSH No results found for: HGBA1C Lab Results  Component Value Date   WBC 7.0 01/26/2020   HGB 14.7 01/26/2020   HCT 42.5 01/26/2020   MCV 94 01/26/2020   PLT 238 01/26/2020   Lab Results  Component Value Date   ALT 26 01/26/2020   AST 23 01/26/2020   ALKPHOS 57 01/26/2020   BILITOT 0.3 01/26/2020     Review of Systems  Constitutional: Negative.  Negative for chills, fatigue, fever, malaise/fatigue and unexpected weight change.  HENT: Negative for congestion, ear discharge, ear pain, rhinorrhea, sinus pressure, sneezing and sore throat.   Eyes: Negative for blurred vision, photophobia, pain, discharge, redness and itching.  Respiratory: Negative for cough, shortness of breath, wheezing and stridor.    Cardiovascular: Negative for chest pain, palpitations, orthopnea and PND.  Gastrointestinal: Negative for abdominal pain, blood in stool, constipation, diarrhea, nausea and vomiting.  Endocrine: Negative for cold intolerance, heat intolerance, polydipsia, polyphagia and polyuria.  Genitourinary: Negative for dysuria, flank pain, frequency, hematuria, menstrual problem, pelvic pain, urgency, vaginal bleeding and vaginal discharge.  Musculoskeletal: Negative for arthralgias, back pain, myalgias and neck pain.  Skin: Negative for rash.  Allergic/Immunologic: Negative for environmental allergies and food allergies.  Neurological: Negative for dizziness, weakness, light-headedness, numbness and headaches.  Hematological: Negative for adenopathy. Does not bruise/bleed easily.  Psychiatric/Behavioral: Negative for dysphoric mood. The patient is not nervous/anxious.     Patient Active Problem List   Diagnosis Date Noted  . Elevated blood-pressure reading, without diagnosis of hypertension 08/26/2020  . Menorrhagia with irregular cycle 04/02/2020  . Impingement syndrome of left shoulder region 12/15/2018  . Cervical myofascial pain syndrome 02/14/2016  . History of breast cancer 11/07/2015  . Postlaminectomy syndrome, lumbar region 04/12/2012  . Chemotherapy-induced neuropathy ([place]) 04/12/2012  . 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 ([place]) 08/07/2011    Allergies  Allergen Reactions  . Penicillins Anaphylaxis  . Sulfa Antibiotics Anaphylaxis  . Ondansetron     migraines  . Other   . Zofran [Ondansetron Hcl]     migraines    Past Surgical History:  Procedure Laterality Date  . BREAST BIOPSY Right 07/2011   invasive ductal carcinoma  . 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 RAd  and chemo tx  . BREAST SURGERY    . mastectomy Right    partial with lymph node dissection  . PORT A CATH REVISION    . PORT-A-CATH  REMOVAL  11-07-15   Dr Bary Castilla  . SHOULDER ARTHROSCOPY WITH ROTATOR CUFF REPAIR Left 05/18/2019   Procedure: SHOULDER ARTHROSCOPY WITH SUBACROMIAL DECOMPRESSION AND DISTAL CLAVICAL EXCISION, INTRAARTICULAR DEBRIDEMENT;  Surgeon: Lovell Sheehan, MD;  Location: [facility];  Service: Orthopedics;  Laterality: Left;  . SPINE SURGERY  2008   Dr Joya Salm  . TUBAL LIGATION      Social History   Tobacco Use  . Smoking status: Former Smoker    Packs/day: 1.00    Years: 20.00    Pack years: 20.00    Types: Cigarettes    Quit date: 01/08/2020    Years since quitting: 0.7  . Smokeless tobacco: Never Used  Vaping Use  . Vaping Use: Never used  Substance Use Topics  . Alcohol use: Not Currently    Comment: socially  . Drug use: No     Medication list has been reviewed and updated.  Current Meds  Medication Sig  . hydrochlorothiazide (HYDRODIURIL) 12.5 MG tablet Take 1 tablet (12.5 mg total) by mouth daily.  Marland Kitchen HYDROcodone-acetaminophen (NORCO) 10-325 MG tablet Take 1 tablet by mouth every 6 (six) hours as needed. May take an extra tablet on work days-no more than 5 per day prn  . ibuprofen (ADVIL,MOTRIN) 800 MG tablet Take 800 mg by mouth every 6 (six) hours as needed (pain).   . NONFORMULARY OR COMPOUNDED ITEM Use as directed  . nortriptyline (PAMELOR) 10 MG capsule TAKE 3 CAPSULES BY MOUTH EVERY DAY AT BEDTIME  . Spacer/Aero-Holding Chambers (AEROCHAMBER MV) inhaler Use as instructed  . UNABLE TO FIND Muscle relaxer. Doesn't know the name of it  . zolpidem (AMBIEN) 10 MG tablet TAKE 1 TABLET BY MOUTH AT BEDTIME AS NEEDED FOR INSOMNIA    PHQ 2/9 Scores 10/22/2020 09/30/2020 08/21/2020 05/28/2020  PHQ - 2 Score 0 0 0 2  PHQ- 9 Score 0 - - -    GAD 7 : Generalized Anxiety Score 10/22/2020 01/26/2020  Nervous, Anxious, on Edge 0 0  Control/stop worrying 0 0  Worry too much - different things 1 0  Trouble relaxing 0 0  Restless 0 0  Easily annoyed or irritable 0 0  Afraid - awful  might happen 0 0  Total GAD 7 Score 1 0  Anxiety Difficulty Not difficult at all Not difficult at all    BP Readings from Last 3 Encounters:  10/22/20 120/80  09/30/20 135/88  08/21/20 105/76    Physical Exam Vitals and nursing note reviewed.  Constitutional:      Appearance: She is well-developed.  HENT:     Head: Normocephalic.     Right Ear: Tympanic membrane, ear canal and external ear normal. There is no impacted cerumen.     Left Ear: Tympanic membrane, ear canal and external ear normal. There is no impacted cerumen.     Nose: Nose normal. No congestion or rhinorrhea.     Mouth/Throat:     Mouth: Mucous membranes are moist.  Eyes:     General: Lids are everted, no foreign bodies appreciated. No scleral icterus.       Left eye: No foreign body or hordeolum.     Conjunctiva/sclera: Conjunctivae normal.     Right eye: Right conjunctiva is not injected.     Left eye: Left conjunctiva  is not injected.     Pupils: Pupils are equal, round, and reactive to light.  Neck:     Thyroid: No thyromegaly.     Vascular: No JVD.     Trachea: No tracheal deviation.  Cardiovascular:     Rate and Rhythm: Normal rate and regular rhythm.     Heart sounds: Normal heart sounds. No murmur heard. No friction rub. No gallop.   Pulmonary:     Effort: Pulmonary effort is normal. No respiratory distress.     Breath sounds: Normal breath sounds. No wheezing, rhonchi or rales.  Abdominal:     General: Bowel sounds are normal.     Palpations: Abdomen is soft. There is no mass.     Tenderness: There is no abdominal tenderness. There is no guarding or rebound.  Musculoskeletal:        General: No tenderness. Normal range of motion.     Left upper arm: Deformity present.     Cervical back: Normal range of motion and neck supple.     Comments: Bicep rupture  Lymphadenopathy:     Cervical: No cervical adenopathy.  Skin:    General: Skin is warm.     Findings: No rash.  Neurological:     Mental  Status: She is alert and oriented to person, place, and time.     Cranial Nerves: No cranial nerve deficit.     Deep Tendon Reflexes: Reflexes normal.  Psychiatric:        Mood and Affect: Mood is not anxious or depressed.     Wt Readings from Last 3 Encounters:  10/22/20 161 lb (73 kg)  09/30/20 161 lb 12.8 oz (73.4 kg)  08/21/20 155 lb (70.3 kg)    BP 120/80   Pulse 80   Ht 5\' 4"  (1.626 m)   Wt 161 lb (73 kg)   LMP 10/15/2020 (Approximate)   BMI 27.64 kg/m   Assessment and Plan:  1. Essential hypertension Chronic.  Controlled.  Stable.  Blood pressure is 120/80.  We will continue hydrochlorothiazide 12.5 mg once a day.  Will check renal function panel for electrolytes and GFR. - Renal Function Panel - hydrochlorothiazide (HYDRODIURIL) 12.5 MG tablet; Take 1 tablet (12.5 mg total) by mouth daily.  Dispense: 90 tablet; Refill: 1  2. Overweight (BMI 25.0-29.9) Patient given Dash diet for weight control.  We will check lipid panel current status of LDL. - Lipid Panel With LDL/HDL Ratio  3. Lipoma of left upper extremity Last year Dr. Grayland Ormond ordered an ultrasound for a swelling of her left upper arm and the ultrasound was consistent with a lipoma however this appears to be more of a ruptured biceps on exam to me.  We will refer to sports medicine with Dr. Zigmund Daniel in 4 weeks for recheck and delineation of whether this is bicep versus lipoma.

## 2020-10-22 NOTE — Patient Instructions (Signed)

## 2020-10-23 LAB — LIPID PANEL WITH LDL/HDL RATIO
Cholesterol, Total: 159 mg/dL (ref 100–199)
HDL: 58 mg/dL (ref >39)
LDL Chol Calc (NIH): 86 mg/dL (ref 0–99)
LDL/HDL Ratio: 1.5 ratio (ref 0.0–3.2)
Triglycerides: 77 mg/dL (ref 0–149)
VLDL Cholesterol Cal: 15 mg/dL (ref 5–40)

## 2020-10-23 LAB — RENAL FUNCTION PANEL
Albumin: 3.9 g/dL (ref 3.8–4.8)
BUN/Creatinine Ratio: 13 (ref 9–23)
BUN: 9 mg/dL (ref 6–24)
CO2: 23 mmol/L (ref 20–29)
Calcium: 9.2 mg/dL (ref 8.7–10.2)
Chloride: 102 mmol/L (ref 96–106)
Creatinine, Ser: 0.7 mg/dL (ref 0.57–1.00)
Glucose: 85 mg/dL (ref 65–99)
Phosphorus: 2.6 mg/dL — ABNORMAL LOW (ref 3.0–4.3)
Potassium: 4.6 mmol/L (ref 3.5–5.2)
Sodium: 140 mmol/L (ref 134–144)
eGFR: 110 mL/min/{1.73_m2} (ref >59)

## 2020-10-29 ENCOUNTER — Encounter: Payer: BC Managed Care – PPO | Admitting: Registered Nurse

## 2020-10-30 ENCOUNTER — Encounter: Payer: BC Managed Care – PPO | Attending: Physical Medicine and Rehabilitation | Admitting: Registered Nurse

## 2020-10-30 ENCOUNTER — Other Ambulatory Visit: Payer: Self-pay

## 2020-10-30 ENCOUNTER — Encounter: Payer: Self-pay | Admitting: Registered Nurse

## 2020-10-30 VITALS — BP 127/83 | HR 90 | Temp 97.3°F | Ht 64.0 in | Wt 163.0 lb

## 2020-10-30 DIAGNOSIS — T451X5A Adverse effect of antineoplastic and immunosuppressive drugs, initial encounter: Secondary | ICD-10-CM | POA: Insufficient documentation

## 2020-10-30 DIAGNOSIS — G894 Chronic pain syndrome: Secondary | ICD-10-CM | POA: Diagnosis not present

## 2020-10-30 DIAGNOSIS — Z79891 Long term (current) use of opiate analgesic: Secondary | ICD-10-CM | POA: Insufficient documentation

## 2020-10-30 DIAGNOSIS — M961 Postlaminectomy syndrome, not elsewhere classified: Secondary | ICD-10-CM | POA: Insufficient documentation

## 2020-10-30 DIAGNOSIS — G620 Drug-induced polyneuropathy: Secondary | ICD-10-CM | POA: Diagnosis not present

## 2020-10-30 DIAGNOSIS — Z5181 Encounter for therapeutic drug level monitoring: Secondary | ICD-10-CM | POA: Diagnosis not present

## 2020-10-30 DIAGNOSIS — M5416 Radiculopathy, lumbar region: Secondary | ICD-10-CM | POA: Insufficient documentation

## 2020-10-30 MED ORDER — HYDROCODONE-ACETAMINOPHEN 10-325 MG PO TABS: 1.0000 | ORAL_TABLET | Freq: Four times a day (QID) | ORAL | 0 refills | Status: DC | PRN

## 2020-10-30 NOTE — Progress Notes (Signed)
Subjective:    Patient ID: Elizabeth Torres, female    DOB: 1975/12/27, 45 y.o.   MRN: [id number]  HPI: Elizabeth Torres is a 45 y.o. female who returns for follow up appointment for chronic pain and medication refill. She states her  pain is located in her lower back radiating into her bilateral lower extremities. She rates her pain 4. Her current exercise regime is walking and performing stretching exercises.  [name] Morphine equivalent is 48.33 MME.    Last UDS was Performed on 09/30/2020, it was consistent.    Pain Inventory Average Pain 4 Pain Right Now 4 My pain is constant, dull and aching  In the last 24 hours, has pain interfered with the following? General activity 4 Relation with others 3 Enjoyment of life 3 What TIME of day is your pain at its worst? evening Sleep (in general) Fair  Pain is worse with: sitting, inactivity, standing and some activites Pain improves with: rest, heat/ice and medication Relief from Meds: 7  Family History  Problem Relation Age of Onset  . Diabetes Mother   . Hypertension Mother   . Pulmonary fibrosis Mother   . Cancer Paternal Uncle   . Breast cancer Paternal Uncle        40'S  . Breast cancer Paternal Aunt        60'S  . Other Father        unknown medical history   Social History   Socioeconomic History  . Marital status: Married    Spouse name: Not on file  . Number of children: Not on file  . Years of education: Not on file  . Highest education level: Not on file  Occupational History  . Not on file  Tobacco Use  . Smoking status: Former Smoker    Packs/day: 1.00    Years: 20.00    Pack years: 20.00    Types: Cigarettes    Quit date: 01/08/2020    Years since quitting: 0.8  . Smokeless tobacco: Never Used  Vaping Use  . Vaping Use: Never used  Substance and Sexual Activity  . Alcohol use: Not Currently    Comment: socially  . Drug use: No  . Sexual activity: Yes  Other Topics Concern  . Not on file  Social  History Narrative  . Not on file   Social Determinants of Health   Financial Resource Strain: Not on file  Food Insecurity: Not on file  Transportation Needs: Not on file  Physical Activity: Not on file  Stress: Not on file  Social Connections: Not on file   Past Surgical History:  Procedure Laterality Date  . BREAST BIOPSY Right 07/2011   invasive ductal carcinoma  . 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 RAd and chemo tx  . BREAST SURGERY    . mastectomy Right    partial with lymph node dissection  . PORT A CATH REVISION    . PORT-A-CATH REMOVAL  11-07-15   Dr Bary Castilla  . SHOULDER ARTHROSCOPY WITH ROTATOR CUFF REPAIR Left 05/18/2019   Procedure: SHOULDER ARTHROSCOPY WITH SUBACROMIAL DECOMPRESSION AND DISTAL CLAVICAL EXCISION, INTRAARTICULAR DEBRIDEMENT;  Surgeon: Lovell Sheehan, MD;  Location: [place];  Service: Orthopedics;  Laterality: Left;  . SPINE SURGERY  2008   Dr Joya Salm  . TUBAL LIGATION     Past Surgical History:  Procedure Laterality Date  . BREAST BIOPSY Right 07/2011   invasive ductal carcinoma  .  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 RAd and chemo tx  . BREAST SURGERY    . mastectomy Right    partial with lymph node dissection  . PORT A CATH REVISION    . PORT-A-CATH REMOVAL  11-07-15   Dr Bary Castilla  . SHOULDER ARTHROSCOPY WITH ROTATOR CUFF REPAIR Left 05/18/2019   Procedure: SHOULDER ARTHROSCOPY WITH SUBACROMIAL DECOMPRESSION AND DISTAL CLAVICAL EXCISION, INTRAARTICULAR DEBRIDEMENT;  Surgeon: Lovell Sheehan, MD;  Location: [place];  Service: Orthopedics;  Laterality: Left;  . SPINE SURGERY  2008   Dr Joya Salm  . TUBAL LIGATION     Past Medical History:  Diagnosis Date  . BRCA negative   . Breast cancer ([place]) 2013   RT LUMPECTOMY with chemo and rad tx  . Degenerative disc disease, lumbar   . Personal history of chemotherapy 2013   for  breast ca  . Personal history of radiation therapy 2013   F/U right breast cancer   . Radiation 2013   BREAST CA  . Status post chemotherapy 2013   BREAST CA   BP 127/83   Pulse 90   Temp (!) 97.3 F (36.3 C)   Ht '5\' 4"'  (1.626 m)   Wt 163 lb (73.9 kg)   LMP 10/15/2020 (Approximate)   SpO2 98%   BMI 27.98 kg/m   Opioid Risk Score:   Fall Risk Score:  `1  Depression screen PHQ 2/9  Depression screen [facility] 2/9 10/22/2020 09/30/2020 08/21/2020 05/28/2020 03/26/2020 02/16/2020 01/26/2020  Decreased Interest 0 0 0 1 0 0 0  Down, Depressed, Hopeless 0 0 0 1 0 0 0  PHQ - 2 Score 0 0 0 2 0 0 0  Altered sleeping 0 - - - - - 0  Tired, decreased energy 0 - - - - - 0  Change in appetite 0 - - - - - 0  Feeling bad or failure about yourself  0 - - - - - 0  Trouble concentrating 0 - - - - - 0  Moving slowly or fidgety/restless 0 - - - - - 0  Suicidal thoughts 0 - - - - - 0  PHQ-9 Score 0 - - - - - 0  Difficult doing work/chores - - - - - - Not difficult at all  Some recent data might be hidden     Review of Systems  Musculoskeletal: Positive for back pain and neck pain.       Leg pain  All other systems reviewed and are negative.      Objective:   Physical Exam Vitals and nursing note reviewed.  Constitutional:      Appearance: Normal appearance.  Cardiovascular:     Rate and Rhythm: Normal rate and regular rhythm.     Pulses: Normal pulses.     Heart sounds: Normal heart sounds.  Pulmonary:     Effort: Pulmonary effort is normal.     Breath sounds: Normal breath sounds.  Musculoskeletal:     Cervical back: Normal range of motion and neck supple.     Comments: Normal Muscle Bulk and Muscle Testing Reveals:  Upper Extremities: Full ROM and Muscle Strength 5/5 Lumbar Paraspinal Tenderness: L-3-L-5 Lower Extremities: Full ROM and Muscle Strength 5/5 Arises from chair with ease Narrow Based Gait   Skin:    General: Skin is warm and dry.  Neurological:     Mental Status: She  is alert and oriented to person, place, and  time.  Psychiatric:        Mood and Affect: Mood normal.        Behavior: Behavior normal.           Assessment & Plan:  1. Lumbar postlaminectomy syndrome status post L5-S1 fusion with chronic S1 radiculopathy.Continuecurrent medication regimen withNortriptyline.10/30/2020. Refilled:Hydrocodone 10/325 mg #145-one every 6 hours as needed for pain, may take an extra tablet on work days only.10/30/2020. We will continue the opioid monitoring program, this consists of regular clinic visits, examinations, urine drug screen, pill counts as well as use of [place] Controlled Substance Reporting system. A 12 month History has been reviewed on the [place] Controlled Substance Reporting Systemon03/30/2022. 2. 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 Insomnia: Continuecurrent medication regimenAmbien.10/30/2020 4.Chronic Left Shoulder Pain:No complaints today: S/PLeft Shoulder Arthroscopy with Rotator Cuff Repair on 05/18/2019 by Dr. Gunnar Fusi: Ortho Following.10/30/2020 5. Cervicalgia/ Cervical Radiculitis: Continue current medication regimen. Continue to monitor.She will schedule an appointment with her neurologist. 10/30/2020  F/U in 1 month.

## 2020-11-05 ENCOUNTER — Encounter: Payer: Self-pay | Admitting: Podiatry

## 2020-11-05 ENCOUNTER — Ambulatory Visit (INDEPENDENT_AMBULATORY_CARE_PROVIDER_SITE_OTHER): Payer: BC Managed Care – PPO | Admitting: Podiatry

## 2020-11-05 ENCOUNTER — Other Ambulatory Visit: Payer: Self-pay

## 2020-11-05 DIAGNOSIS — M722 Plantar fascial fibromatosis: Secondary | ICD-10-CM | POA: Diagnosis not present

## 2020-11-05 DIAGNOSIS — Q666 Other congenital valgus deformities of feet: Secondary | ICD-10-CM | POA: Diagnosis not present

## 2020-11-05 NOTE — Progress Notes (Signed)
Subjective:  Patient ID: Elizabeth Torres, female    DOB: 1975-08-09,  MRN: [id number]  Chief Complaint  Patient presents with  . Plantar Fasciitis    "its doing  much better now"    45 y.o. female presents with the above complaint.  Patient presents for follow-up of right plantar fasciitis.  She is about 100% healed.  She states a steroid injection helped considerably.  She has been wearing her braces whenever her pain does start up a little bit.  She is interested in getting orthotics.   Review of Systems: Negative except as noted in the HPI. Denies N/V/F/Ch.  Past Medical History:  Diagnosis Date  . BRCA negative   . Breast cancer ([place]) 2013   RT LUMPECTOMY with chemo and rad tx  . Degenerative disc disease, lumbar   . Personal history of chemotherapy 2013   for breast ca  . Personal history of radiation therapy 2013   F/U right breast cancer   . Radiation 2013   BREAST CA  . Status post chemotherapy 2013   BREAST CA    Current Outpatient Medications:  .  hydrochlorothiazide (HYDRODIURIL) 12.5 MG tablet, Take 1 tablet (12.5 mg total) by mouth daily., Disp: 90 tablet, Rfl: 1 .  HYDROcodone-acetaminophen (NORCO) 10-325 MG tablet, Take 1 tablet by mouth every 6 (six) hours as needed. May take an extra tablet on work days-no more than 5 per day prn, Disp: 145 tablet, Rfl: 0 .  ibuprofen (ADVIL,MOTRIN) 800 MG tablet, Take 800 mg by mouth every 6 (six) hours as needed (pain). , Disp: , Rfl:  .  NONFORMULARY OR COMPOUNDED ITEM, Use as directed, Disp: 120 each, Rfl: 1 .  nortriptyline (PAMELOR) 10 MG capsule, TAKE 3 CAPSULES BY MOUTH EVERY DAY AT BEDTIME, Disp: 180 capsule, Rfl: 3 .  Spacer/Aero-Holding Chambers (AEROCHAMBER MV) inhaler, Use as instructed, Disp: 1 each, Rfl: 2 .  UNABLE TO FIND, Muscle relaxer. Doesn't know the name of it, Disp: , Rfl:  .  zolpidem (AMBIEN) 10 MG tablet, TAKE 1 TABLET BY MOUTH AT BEDTIME AS NEEDED FOR INSOMNIA, Disp: 30 tablet, Rfl: 3  Social History    Tobacco Use  Smoking Status Former Smoker  . Packs/day: 1.00  . Years: 20.00  . Pack years: 20.00  . Types: Cigarettes  . Quit date: 01/08/2020  . Years since quitting: 0.8  Smokeless Tobacco Never Used    Allergies  Allergen Reactions  . Penicillins Anaphylaxis  . Sulfa Antibiotics Anaphylaxis  . Ondansetron     migraines  . Other   . Zofran [Ondansetron Hcl]     migraines   Objective:  There were no vitals filed for this visit. There is no height or weight on file to calculate BMI. Constitutional Well developed. Well nourished.  Vascular Dorsalis pedis pulses palpable bilaterally. Posterior tibial pulses palpable bilaterally. Capillary refill normal to all digits.  No cyanosis or clubbing noted. Pedal hair growth normal.  Neurologic Normal speech. Oriented to person, place, and time. Epicritic sensation to light touch grossly present bilaterally.  Dermatologic Nails well groomed and normal in appearance. No open wounds. No skin lesions.  Orthopedic: Normal joint ROM without pain or crepitus bilaterally. No visible deformities. No tender to palpation at the calcaneal tuber right. No pain with calcaneal squeeze right. Ankle ROM diminished range of motion right. Silfverskiold Test: positive right.   Radiographs: Review of prior radiographs show small heel spur to the posterior heel.  Assessment:   1. Pes planovalgus  2. Plantar fasciitis of right foot    Plan:  Patient was evaluated and treated and all questions answered.  Plantar Fasciitis, right -Clinically healed.  I encouraged her to utilize her bracing as well as stretching exercises to prevent plantar fasciitis.  Ultimately she will benefit from orthotics for future prevention of plantar fasciitis.  I discussed the benefits of orthotics.  She states understanding we will plan on obtaining them.  Also discussed shoe gear modification as well.  She states understanding.  If any foot and ankle issues arise  in future come back and see me.  Pes planovalgus -I explained the patient the etiology of pes planovalgus emergency room and options were extensively discussed.  Given the patient has chronically recurrent plantar fasciitis in the setting of not wearing orthotics.  I believe patient will benefit from custom-made orthotics to help control the hindfoot motion and support the arch of the foot take the stress away from the plantar fascial.  Patient states understanding and will obtain orthotics -She will make an appointment to see the orthotics department.    No follow-ups on file.

## 2020-11-19 ENCOUNTER — Other Ambulatory Visit: Payer: Self-pay | Admitting: Registered Nurse

## 2020-11-22 ENCOUNTER — Encounter: Payer: BC Managed Care – PPO | Attending: Physical Medicine and Rehabilitation | Admitting: Registered Nurse

## 2020-11-22 ENCOUNTER — Encounter: Payer: Self-pay | Admitting: Registered Nurse

## 2020-11-22 ENCOUNTER — Other Ambulatory Visit: Payer: Self-pay

## 2020-11-22 VITALS — BP 109/76 | HR 92 | Temp 98.1°F | Ht 64.0 in | Wt 155.0 lb

## 2020-11-22 DIAGNOSIS — T451X5A Adverse effect of antineoplastic and immunosuppressive drugs, initial encounter: Secondary | ICD-10-CM | POA: Diagnosis not present

## 2020-11-22 DIAGNOSIS — G894 Chronic pain syndrome: Secondary | ICD-10-CM | POA: Diagnosis not present

## 2020-11-22 DIAGNOSIS — G620 Drug-induced polyneuropathy: Secondary | ICD-10-CM | POA: Diagnosis not present

## 2020-11-22 DIAGNOSIS — Z79891 Long term (current) use of opiate analgesic: Secondary | ICD-10-CM | POA: Insufficient documentation

## 2020-11-22 DIAGNOSIS — Z5181 Encounter for therapeutic drug level monitoring: Secondary | ICD-10-CM | POA: Insufficient documentation

## 2020-11-22 DIAGNOSIS — M961 Postlaminectomy syndrome, not elsewhere classified: Secondary | ICD-10-CM | POA: Diagnosis not present

## 2020-11-22 DIAGNOSIS — M5416 Radiculopathy, lumbar region: Secondary | ICD-10-CM | POA: Diagnosis not present

## 2020-11-22 MED ORDER — HYDROCODONE-ACETAMINOPHEN 10-325 MG PO TABS
1.0000 | ORAL_TABLET | Freq: Four times a day (QID) | ORAL | 0 refills | Status: DC | PRN
Start: 2020-11-22 — End: 2020-12-25

## 2020-11-22 NOTE — Progress Notes (Signed)
Subjective:    Patient ID: Elizabeth Torres, female    DOB: May 12, 1976, 45 y.o.   MRN: [id number]  HPI: Elizabeth Torres is a 45 y.o. female who returns for follow up appointment for chronic pain and medication refill. She states her pain is located in her lower back radiating into her bilateral lower extremities. She rates her pain 4. Her current exercise regime is walking and performing stretching exercises.  Ms. Niebuhr Morphine equivalent is 48.33 MME.  LastUDS was Performed on 09/30/2020, it was consistent.    Pain Inventory Average Pain 4 Pain Right Now 4 My pain is constant and aching  In the last 24 hours, has pain interfered with the following? General activity 4 Relation with others 4 Enjoyment of life 3 What TIME of day is your pain at its worst? evening Sleep (in general) Fair  Pain is worse with: inactivity and standing Pain improves with: rest and medication Relief from Meds: 5  Family History  Problem Relation Age of Onset  . Diabetes Mother   . Hypertension Mother   . Pulmonary fibrosis Mother   . Cancer Paternal Uncle   . Breast cancer Paternal Uncle        40'S  . Breast cancer Paternal Aunt        60'S  . Other Father        unknown medical history   Social History   Socioeconomic History  . Marital status: Married    Spouse name: Not on file  . Number of children: Not on file  . Years of education: Not on file  . Highest education level: Not on file  Occupational History  . Not on file  Tobacco Use  . Smoking status: Former Smoker    Packs/day: 1.00    Years: 20.00    Pack years: 20.00    Types: Cigarettes    Quit date: 01/08/2020    Years since quitting: 0.8  . Smokeless tobacco: Never Used  Vaping Use  . Vaping Use: Never used  Substance and Sexual Activity  . Alcohol use: Not Currently    Comment: socially  . Drug use: No  . Sexual activity: Yes  Other Topics Concern  . Not on file  Social History Narrative  . Not on file   Social  Determinants of Health   Financial Resource Strain: Not on file  Food Insecurity: Not on file  Transportation Needs: Not on file  Physical Activity: Not on file  Stress: Not on file  Social Connections: Not on file   Past Surgical History:  Procedure Laterality Date  . BREAST BIOPSY Right 07/2011   invasive ductal carcinoma  . 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 RAd and chemo tx  . BREAST SURGERY    . mastectomy Right    partial with lymph node dissection  . PORT A CATH REVISION    . PORT-A-CATH REMOVAL  11-07-15   Dr Bary Castilla  . SHOULDER ARTHROSCOPY WITH ROTATOR CUFF REPAIR Left 05/18/2019   Procedure: SHOULDER ARTHROSCOPY WITH SUBACROMIAL DECOMPRESSION AND DISTAL CLAVICAL EXCISION, INTRAARTICULAR DEBRIDEMENT;  Surgeon: Lovell Sheehan, MD;  Location: [place];  Service: Orthopedics;  Laterality: Left;  . SPINE SURGERY  2008   Dr Joya Salm  . TUBAL LIGATION     Past Surgical History:  Procedure Laterality Date  . BREAST BIOPSY Right 07/2011   invasive ductal carcinoma  . BREAST LUMPECTOMY Right 08/2011   invasive ductal  carcinoma and DCIS. Margins clear. RAd and chemo tx  . BREAST SURGERY    . mastectomy Right    partial with lymph node dissection  . PORT A CATH REVISION    . PORT-A-CATH REMOVAL  11-07-15   Dr Bary Castilla  . SHOULDER ARTHROSCOPY WITH ROTATOR CUFF REPAIR Left 05/18/2019   Procedure: SHOULDER ARTHROSCOPY WITH SUBACROMIAL DECOMPRESSION AND DISTAL CLAVICAL EXCISION, INTRAARTICULAR DEBRIDEMENT;  Surgeon: Lovell Sheehan, MD;  Location: [place];  Service: Orthopedics;  Laterality: Left;  . SPINE SURGERY  2008   Dr Joya Salm  . TUBAL LIGATION     Past Medical History:  Diagnosis Date  . BRCA negative   . Breast cancer ([place]) 2013   RT LUMPECTOMY with chemo and rad tx  . Degenerative disc disease, lumbar   . Personal history of chemotherapy 2013   for breast ca  . Personal history of radiation  therapy 2013   F/U right breast cancer   . Radiation 2013   BREAST CA  . Status post chemotherapy 2013   BREAST CA   BP 109/76   Pulse 92   Temp 98.1 F (36.7 C)   Ht '5\' 4"'  (1.626 m)   Wt 155 lb (70.3 kg)   SpO2 95%   BMI 26.61 kg/m   Opioid Risk Score:   Fall Risk Score:  `1  Depression screen PHQ 2/9  Depression screen [facility] 2/9 10/30/2020 10/22/2020 09/30/2020 08/21/2020 05/28/2020 03/26/2020 02/16/2020  Decreased Interest 0 0 0 0 1 0 0  Down, Depressed, Hopeless 0 0 0 0 1 0 0  PHQ - 2 Score 0 0 0 0 2 0 0  Altered sleeping - 0 - - - - -  Tired, decreased energy - 0 - - - - -  Change in appetite - 0 - - - - -  Feeling bad or failure about yourself  - 0 - - - - -  Trouble concentrating - 0 - - - - -  Moving slowly or fidgety/restless - 0 - - - - -  Suicidal thoughts - 0 - - - - -  PHQ-9 Score - 0 - - - - -  Difficult doing work/chores - - - - - - -  Some recent data might be hidden      Review of Systems  Constitutional: Negative.   HENT: Negative.   Eyes: Negative.   Respiratory: Negative.   Cardiovascular: Negative.   Gastrointestinal: Negative.   Endocrine: Negative.   Genitourinary: Negative.   Musculoskeletal: Positive for back pain and gait problem.       RIGHT AND LEFT LEG PAIN  Skin: Negative.   Allergic/Immunologic: Negative.   Hematological: Negative.   Psychiatric/Behavioral: Negative.        Objective:   Physical Exam Vitals and nursing note reviewed.  Constitutional:      Appearance: Normal appearance.  Cardiovascular:     Rate and Rhythm: Normal rate and regular rhythm.     Pulses: Normal pulses.     Heart sounds: Normal heart sounds.  Pulmonary:     Effort: Pulmonary effort is normal.     Breath sounds: Normal breath sounds.  Musculoskeletal:     Cervical back: Normal range of motion and neck supple.     Comments: Normal Muscle Bulk and Muscle Testing Reveals:  Upper Extremities: Full ROM and Muscle Strength 5/5   Lower Extremities:  Full ROM and Muscle Strength 5/5 Arises from Table with ease Narrow Based  Gait  Skin:    General: Skin is warm and dry.  Neurological:     Mental Status: She is alert and oriented to person, place, and time.  Psychiatric:        Mood and Affect: Mood normal.        Behavior: Behavior normal.           Assessment & Plan:  1. Lumbar postlaminectomy syndrome status post L5-S1 fusion with chronic S1 radiculopathy.Continuecurrent medication regimen withNortriptyline.11/22/2020. Refilled:Hydrocodone 10/325 mg #145-one every 6 hours as needed for pain, may take an extra tablet on work days only.11/22/2020. We will continue the opioid monitoring program, this consists of regular clinic visits, examinations, urine drug screen, pill counts as well as use of [place] Controlled Substance Reporting system. A 12 month History has been reviewed on the [place] Controlled Substance Reporting Systemon03/30/2022. 2. 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 Insomnia: Continuecurrent medication regimenAmbien.11/22/2020 4.Chronic Left Shoulder Pain:No complaints today: S/PLeft Shoulder Arthroscopy with Rotator Cuff Repair on 05/18/2019 by Dr. Gunnar Fusi: Ortho Following.11/22/2020 5. Cervicalgia/ Cervical Radiculitis: No complaints today. Continue current medication regimen. Continue to monitor.She will schedule an appointment with her neurologist. 11/22/2020  F/U in 1 month.

## 2020-11-28 ENCOUNTER — Encounter: Payer: BC Managed Care – PPO | Admitting: Family Medicine

## 2020-12-07 ENCOUNTER — Other Ambulatory Visit: Payer: Self-pay | Admitting: Physical Medicine & Rehabilitation

## 2020-12-11 ENCOUNTER — Ambulatory Visit (INDEPENDENT_AMBULATORY_CARE_PROVIDER_SITE_OTHER): Payer: BC Managed Care – PPO

## 2020-12-11 ENCOUNTER — Other Ambulatory Visit: Payer: Self-pay

## 2020-12-11 DIAGNOSIS — M722 Plantar fascial fibromatosis: Secondary | ICD-10-CM | POA: Diagnosis not present

## 2020-12-11 DIAGNOSIS — Q666 Other congenital valgus deformities of feet: Secondary | ICD-10-CM | POA: Diagnosis not present

## 2020-12-25 ENCOUNTER — Other Ambulatory Visit: Payer: Self-pay

## 2020-12-25 ENCOUNTER — Encounter: Payer: Self-pay | Admitting: Registered Nurse

## 2020-12-25 ENCOUNTER — Encounter: Payer: BC Managed Care – PPO | Attending: Physical Medicine and Rehabilitation | Admitting: Registered Nurse

## 2020-12-25 VITALS — BP 124/84 | HR 90 | Temp 98.4°F | Ht 63.0 in | Wt 149.0 lb

## 2020-12-25 DIAGNOSIS — M961 Postlaminectomy syndrome, not elsewhere classified: Secondary | ICD-10-CM | POA: Diagnosis not present

## 2020-12-25 DIAGNOSIS — T451X5A Adverse effect of antineoplastic and immunosuppressive drugs, initial encounter: Secondary | ICD-10-CM | POA: Diagnosis not present

## 2020-12-25 DIAGNOSIS — M5416 Radiculopathy, lumbar region: Secondary | ICD-10-CM

## 2020-12-25 DIAGNOSIS — G894 Chronic pain syndrome: Secondary | ICD-10-CM | POA: Diagnosis not present

## 2020-12-25 DIAGNOSIS — G620 Drug-induced polyneuropathy: Secondary | ICD-10-CM | POA: Diagnosis not present

## 2020-12-25 DIAGNOSIS — Z79891 Long term (current) use of opiate analgesic: Secondary | ICD-10-CM | POA: Diagnosis not present

## 2020-12-25 DIAGNOSIS — Z5181 Encounter for therapeutic drug level monitoring: Secondary | ICD-10-CM

## 2020-12-25 MED ORDER — HYDROCODONE-ACETAMINOPHEN 10-325 MG PO TABS
1.0000 | ORAL_TABLET | Freq: Four times a day (QID) | ORAL | 0 refills | Status: DC | PRN
Start: 2020-12-25 — End: 2021-01-24

## 2020-12-25 NOTE — Progress Notes (Signed)
Subjective:    Patient ID: Elizabeth Torres, female    DOB: May 23, 1976, 45 y.o.   MRN: [id number]  HPI: Elizabeth Torres is a 45 y.o. female who returns for follow up appointment for chronic pain and medication refill. She states her pain is located in her lower back radiating into her bilateral lower extremities. She rates her pain 4. Her current exercise regime is walking and performing stretching exercises.  Ms. Biello Morphine equivalent is 48.33 MME.  Last UDS was Performed on 09/30/2020, it was consistent.      Pain Inventory Average Pain 4 Pain Right Now 4 My pain is constant and aching  In the last 24 hours, has pain interfered with the following? General activity 4 Relation with others 4 Enjoyment of life 3 What TIME of day is your pain at its worst? evening Sleep (in general) Fair  Pain is worse with: inactivity Pain improves with: medication Relief from Meds: 8  Family History  Problem Relation Age of Onset  . Diabetes Mother   . Hypertension Mother   . Pulmonary fibrosis Mother   . Cancer Paternal Uncle   . Breast cancer Paternal Uncle        40'S  . Breast cancer Paternal Aunt        60'S  . Other Father        unknown medical history   Social History   Socioeconomic History  . Marital status: Married    Spouse name: Not on file  . Number of children: Not on file  . Years of education: Not on file  . Highest education level: Not on file  Occupational History  . Not on file  Tobacco Use  . Smoking status: Former Smoker    Packs/day: 1.00    Years: 20.00    Pack years: 20.00    Types: Cigarettes    Quit date: 01/08/2020    Years since quitting: 0.9  . Smokeless tobacco: Never Used  Vaping Use  . Vaping Use: Never used  Substance and Sexual Activity  . Alcohol use: Not Currently    Comment: socially  . Drug use: No  . Sexual activity: Yes  Other Topics Concern  . Not on file  Social History Narrative  . Not on file   Social Determinants of  Health   Financial Resource Strain: Not on file  Food Insecurity: Not on file  Transportation Needs: Not on file  Physical Activity: Not on file  Stress: Not on file  Social Connections: Not on file   Past Surgical History:  Procedure Laterality Date  . BREAST BIOPSY Right 07/2011   invasive ductal carcinoma  . 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 RAd and chemo tx  . BREAST SURGERY    . mastectomy Right    partial with lymph node dissection  . PORT A CATH REVISION    . PORT-A-CATH REMOVAL  11-07-15   Dr Bary Castilla  . SHOULDER ARTHROSCOPY WITH ROTATOR CUFF REPAIR Left 05/18/2019   Procedure: SHOULDER ARTHROSCOPY WITH SUBACROMIAL DECOMPRESSION AND DISTAL CLAVICAL EXCISION, INTRAARTICULAR DEBRIDEMENT;  Surgeon: Lovell Sheehan, MD;  Location: [place];  Service: Orthopedics;  Laterality: Left;  . SPINE SURGERY  2008   Dr Joya Salm  . TUBAL LIGATION     Past Surgical History:  Procedure Laterality Date  . BREAST BIOPSY Right 07/2011   invasive ductal carcinoma  . BREAST LUMPECTOMY Right 08/2011   invasive ductal carcinoma  and DCIS. Margins clear. RAd and chemo tx  . BREAST SURGERY    . mastectomy Right    partial with lymph node dissection  . PORT A CATH REVISION    . PORT-A-CATH REMOVAL  11-07-15   Dr Bary Castilla  . SHOULDER ARTHROSCOPY WITH ROTATOR CUFF REPAIR Left 05/18/2019   Procedure: SHOULDER ARTHROSCOPY WITH SUBACROMIAL DECOMPRESSION AND DISTAL CLAVICAL EXCISION, INTRAARTICULAR DEBRIDEMENT;  Surgeon: Lovell Sheehan, MD;  Location: [place];  Service: Orthopedics;  Laterality: Left;  . SPINE SURGERY  2008   Dr Joya Salm  . TUBAL LIGATION     Past Medical History:  Diagnosis Date  . BRCA negative   . Breast cancer ([place]) 2013   RT LUMPECTOMY with chemo and rad tx  . Degenerative disc disease, lumbar   . Personal history of chemotherapy 2013   for breast ca  . Personal history of radiation therapy 2013   F/U  right breast cancer   . Radiation 2013   BREAST CA  . Status post chemotherapy 2013   BREAST CA   BP 124/84   Pulse 90   Temp 98.4 F (36.9 C)   Ht '5\' 3"'  (1.6 m)   Wt 149 lb (67.6 kg)   SpO2 96%   BMI 26.39 kg/m   Opioid Risk Score:   Fall Risk Score:  `1  Depression screen PHQ 2/9  Depression screen [facility] 2/9 11/22/2020 10/30/2020 10/22/2020 09/30/2020 08/21/2020 05/28/2020 03/26/2020  Decreased Interest 0 0 0 0 0 1 0  Down, Depressed, Hopeless 0 0 0 0 0 1 0  PHQ - 2 Score 0 0 0 0 0 2 0  Altered sleeping - - 0 - - - -  Tired, decreased energy - - 0 - - - -  Change in appetite - - 0 - - - -  Feeling bad or failure about yourself  - - 0 - - - -  Trouble concentrating - - 0 - - - -  Moving slowly or fidgety/restless - - 0 - - - -  Suicidal thoughts - - 0 - - - -  PHQ-9 Score - - 0 - - - -  Difficult doing work/chores - - - - - - -  Some recent data might be hidden   Review of Systems  Constitutional: Negative.   HENT: Negative.   Eyes: Negative.   Respiratory: Negative.   Cardiovascular: Negative.   Gastrointestinal: Negative.   Endocrine: Negative.   Genitourinary: Negative.   Musculoskeletal: Positive for back pain.  Skin: Negative.   Allergic/Immunologic: Negative.   Neurological: Negative.   Hematological: Negative.   Psychiatric/Behavioral: Negative.   All other systems reviewed and are negative.      Objective:   Physical Exam Vitals and nursing note reviewed.  Constitutional:      Appearance: Normal appearance.  Neck:     Comments: Cervical Paraspinal Tenderness: C-5-C-6 Cardiovascular:     Rate and Rhythm: Normal rate and regular rhythm.     Pulses: Normal pulses.     Heart sounds: Normal heart sounds.  Pulmonary:     Effort: Pulmonary effort is normal.     Breath sounds: Normal breath sounds.  Musculoskeletal:     Cervical back: Normal range of motion and neck supple.     Comments: Normal Muscle Bulk and Muscle Testing Reveals:  Upper  Extremities: Upper Full ROM and Muscle Strength 5/5 Bilateral AC Joint Tenderness  Lumbar Paraspinal Tenderness: L-3-L-5 Lower Extremities: Full ROM and Muscle Strength 5/5  Arises from chair with ease Narrow Based Gait   Skin:    General: Skin is warm and dry.  Neurological:     Mental Status: She is alert and oriented to person, place, and time.  Psychiatric:        Mood and Affect: Mood normal.        Behavior: Behavior normal.           Assessment & Plan:  1. Lumbar postlaminectomy syndrome status post L5-S1 fusion with chronic S1 radiculopathy.Continuecurrent medication regimen withNortriptyline.12/25/2020. Refilled:Hydrocodone 10/325 mg #145-one every 6 hours as needed for pain, may take an extra tablet on work days only.12/25/2020. We will continue the opioid monitoring program, this consists of regular clinic visits, examinations, urine drug screen, pill counts as well as use of [place] Controlled Substance Reporting system. A 12 month History has been reviewed on the [place] Controlled Substance Reporting Systemon05/25/2022. 2. 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 Insomnia: Continuecurrent medication regimenAmbien.12/25/2020 4.Chronic Left Shoulder Pain:No complaints today: S/PLeft Shoulder Arthroscopy with Rotator Cuff Repair on 05/18/2019 by Dr. Gunnar Fusi: Ortho Following.12/25/2020 5. Cervicalgia/ Cervical Radiculitis: No complaints today. Continue current medication regimen. Continue to monitor.She will schedule an appointment with her neurologist. 12/25/2020  F/U in 1 month.

## 2021-01-07 ENCOUNTER — Telehealth: Payer: Self-pay | Admitting: *Deleted

## 2021-01-07 MED ORDER — NORTRIPTYLINE HCL 10 MG PO CAPS: 30.0000 mg | ORAL_CAPSULE | Freq: Every day | ORAL | 1 refills | Status: DC

## 2021-01-07 NOTE — Telephone Encounter (Addendum)
Elizabeth Torres called and reports that due to insurance she is having to change her pharmacy.  She is requesting her nortriptyline and ambien. I called her back to verify and she does not need the ambien at this time and will call back when she needs the refill. Nortriptyline sent to [facility] as requested. [facility] will be removed.

## 2021-01-08 ENCOUNTER — Other Ambulatory Visit: Payer: Self-pay | Admitting: Family Medicine

## 2021-01-08 DIAGNOSIS — I10 Essential (primary) hypertension: Secondary | ICD-10-CM

## 2021-01-08 MED ORDER — HYDROCHLOROTHIAZIDE 12.5 MG PO TABS: 12.5000 mg | ORAL_TABLET | Freq: Every day | ORAL | 1 refills | Status: DC

## 2021-01-08 NOTE — Telephone Encounter (Signed)
Copied from [place] [id number]. Topic: Quick Communication - Rx Refill/Question >> Jan 08, 2021  8:16 AM Leward Quan A wrote: Medication: hydrochlorothiazide (HYDRODIURIL) 12.5 MG tablet 90 day supply  Has the patient contacted their pharmacy? Yes.   (Agent: If no, request that the patient contact the pharmacy for the refill.) (Agent: If yes, when and what did the pharmacy advise?)  Preferred Pharmacy (with phone number or street name): [id number], [address] AT [address]  Phone:  [phone] Fax:  [phone]     Agent: Please be advised that RX refills may take up to 3 business days. We ask that you follow-up with your pharmacy.

## 2021-01-23 ENCOUNTER — Telehealth: Payer: Self-pay

## 2021-01-23 NOTE — Telephone Encounter (Signed)
Okay to schedule new patient appointment via Dr. Ronnald Ramp recommendation 3/22 for delineation of whether left bicep is ruptured versus lipoma?  Patient has [place], unsure why [facility] told patient we did not accept her insurance.

## 2021-01-23 NOTE — Telephone Encounter (Signed)
Copied from [place] [id number]. Topic: General - Other >> Jan 23, 2021 10:38 AM Antonieta Iba C wrote: Reason for CRM: pt called in for assistance. Pt says that PCP has placed a sports medicine referral for her. Pt says that she was told that provider isn't in network with her insurance. Pt would like further assistance with getting set up with a provider that is.    Please assist pt further.

## 2021-01-24 ENCOUNTER — Encounter: Payer: BC Managed Care – PPO | Attending: Physical Medicine and Rehabilitation | Admitting: Registered Nurse

## 2021-01-24 ENCOUNTER — Encounter: Payer: Self-pay | Admitting: Registered Nurse

## 2021-01-24 ENCOUNTER — Other Ambulatory Visit: Payer: Self-pay

## 2021-01-24 VITALS — BP 95/70 | HR 94 | Temp 98.1°F | Ht 63.0 in | Wt 142.2 lb

## 2021-01-24 DIAGNOSIS — M542 Cervicalgia: Secondary | ICD-10-CM | POA: Diagnosis not present

## 2021-01-24 DIAGNOSIS — M961 Postlaminectomy syndrome, not elsewhere classified: Secondary | ICD-10-CM | POA: Diagnosis not present

## 2021-01-24 DIAGNOSIS — T451X5A Adverse effect of antineoplastic and immunosuppressive drugs, initial encounter: Secondary | ICD-10-CM | POA: Diagnosis not present

## 2021-01-24 DIAGNOSIS — Z79891 Long term (current) use of opiate analgesic: Secondary | ICD-10-CM | POA: Diagnosis not present

## 2021-01-24 DIAGNOSIS — M5416 Radiculopathy, lumbar region: Secondary | ICD-10-CM | POA: Diagnosis not present

## 2021-01-24 DIAGNOSIS — G620 Drug-induced polyneuropathy: Secondary | ICD-10-CM | POA: Insufficient documentation

## 2021-01-24 DIAGNOSIS — M5412 Radiculopathy, cervical region: Secondary | ICD-10-CM | POA: Diagnosis not present

## 2021-01-24 DIAGNOSIS — Z5181 Encounter for therapeutic drug level monitoring: Secondary | ICD-10-CM | POA: Insufficient documentation

## 2021-01-24 DIAGNOSIS — M7918 Myalgia, other site: Secondary | ICD-10-CM | POA: Diagnosis not present

## 2021-01-24 DIAGNOSIS — G894 Chronic pain syndrome: Secondary | ICD-10-CM | POA: Insufficient documentation

## 2021-01-24 DIAGNOSIS — G4700 Insomnia, unspecified: Secondary | ICD-10-CM | POA: Diagnosis not present

## 2021-01-24 MED ORDER — HYDROCODONE-ACETAMINOPHEN 10-325 MG PO TABS
1.0000 | ORAL_TABLET | Freq: Four times a day (QID) | ORAL | 0 refills | Status: DC | PRN
Start: 2021-01-24 — End: 2021-02-19

## 2021-01-24 MED ORDER — METHOCARBAMOL 500 MG PO TABS: 500.0000 mg | ORAL_TABLET | Freq: Two times a day (BID) | ORAL | 2 refills | Status: DC | PRN

## 2021-01-24 NOTE — Progress Notes (Signed)
Subjective:    Patient ID: Elizabeth Torres, female    DOB: Feb 08, 1976, 45 y.o.   MRN: [id number]  HPI: Elizabeth Torres is a 45 y.o. female who returns for follow up appointment for chronic pain and medication refill. She states her  pain is located in her neck radiating into her right shoulder and occasionally into her right arm and lower back pain radiating into her bilateral lower extremities. She  rates her pain 4. Her  current exercise regime is walking and performing stretching exercises.  Elizabeth Torres Morphine equivalent is 49.94 MME.   UDS was Ordered today.      Pain Inventory Average Pain 4 Pain Right Now 4 My pain is constant and aching  In the last 24 hours, has pain interfered with the following? General activity 4 Relation with others 3 Enjoyment of life 3 What TIME of day is your pain at its worst? evening Sleep (in general) Fair  Pain is worse with: sitting, inactivity, and some activites Pain improves with: rest and medication Relief from Meds: 7  Family History  Problem Relation Age of Onset   Diabetes Mother    Hypertension Mother    Pulmonary fibrosis Mother    Cancer Paternal Uncle    Breast cancer Paternal Uncle        42'S   Breast cancer Paternal Aunt        49'S   Other Father        unknown medical history   Social History   Socioeconomic History   Marital status: Married    Spouse name: Not on file   Number of children: Not on file   Years of education: Not on file   Highest education level: Not on file  Occupational History   Not on file  Tobacco Use   Smoking status: Former    Packs/day: 1.00    Years: 20.00    Pack years: 20.00    Types: Cigarettes    Quit date: 01/08/2020    Years since quitting: 1.0   Smokeless tobacco: Never  Vaping Use   Vaping Use: Never used  Substance and Sexual Activity   Alcohol use: Not Currently    Comment: socially   Drug use: No   Sexual activity: Yes  Other Topics Concern   Not on file  Social  History Narrative   Not on file   Social Determinants of Health   Financial Resource Strain: Not on file  Food Insecurity: Not on file  Transportation Needs: Not on file  Physical Activity: Not on file  Stress: Not on file  Social Connections: Not on file   Past Surgical History:  Procedure Laterality Date   BREAST BIOPSY Right 07/2011   invasive ductal carcinoma   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 RAd and chemo tx   BREAST SURGERY     mastectomy Right    partial with lymph node dissection   PORT A CATH REVISION     PORT-A-CATH REMOVAL  11-07-15   Dr Bary Castilla   SHOULDER ARTHROSCOPY WITH ROTATOR CUFF REPAIR Left 05/18/2019   Procedure: SHOULDER ARTHROSCOPY WITH SUBACROMIAL DECOMPRESSION AND DISTAL CLAVICAL EXCISION, INTRAARTICULAR DEBRIDEMENT;  Surgeon: Lovell Sheehan, MD;  Location: [place];  Service: Orthopedics;  Laterality: Left;   SPINE SURGERY  2008   Dr Joya Salm   TUBAL LIGATION     Past Surgical History:  Procedure Laterality Date   BREAST BIOPSY Right  07/2011   invasive ductal carcinoma   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 RAd and chemo tx   BREAST SURGERY     mastectomy Right    partial with lymph node dissection   PORT A CATH REVISION     PORT-A-CATH REMOVAL  11-07-15   Dr Bary Castilla   SHOULDER ARTHROSCOPY WITH ROTATOR CUFF REPAIR Left 05/18/2019   Procedure: SHOULDER ARTHROSCOPY WITH SUBACROMIAL DECOMPRESSION AND DISTAL CLAVICAL EXCISION, INTRAARTICULAR DEBRIDEMENT;  Surgeon: Lovell Sheehan, MD;  Location: [place];  Service: Orthopedics;  Laterality: Left;   SPINE SURGERY  2008   Dr Joya Salm   TUBAL LIGATION     Past Medical History:  Diagnosis Date   BRCA negative    Breast cancer ([place]) 2013   RT LUMPECTOMY with chemo and rad tx   Degenerative disc disease, lumbar    Personal history of chemotherapy 2013   for breast ca   Personal  history of radiation therapy 2013   F/U right breast cancer    Radiation 2013   BREAST CA   Status post chemotherapy 2013   BREAST CA   BP 95/70   Pulse 94   Temp 98.1 F (36.7 C)   Ht 5' 3" (1.6 m)   Wt 142 lb 3.2 oz (64.5 kg)   SpO2 97%   BMI 25.19 kg/m   Opioid Risk Score:   Fall Risk Score:  `1  Depression screen PHQ 2/9  Depression screen [facility] 2/9 11/22/2020 10/30/2020 10/22/2020 09/30/2020 08/21/2020 05/28/2020 03/26/2020  Decreased Interest 0 0 0 0 0 1 0  Down, Depressed, Hopeless 0 0 0 0 0 1 0  PHQ - 2 Score 0 0 0 0 0 2 0  Altered sleeping - - 0 - - - -  Tired, decreased energy - - 0 - - - -  Change in appetite - - 0 - - - -  Feeling bad or failure about yourself  - - 0 - - - -  Trouble concentrating - - 0 - - - -  Moving slowly or fidgety/restless - - 0 - - - -  Suicidal thoughts - - 0 - - - -  PHQ-9 Score - - 0 - - - -  Difficult doing work/chores - - - - - - -  Some recent data might be hidden    Review of Systems  Musculoskeletal:  Positive for back pain and neck pain.       Pain in legs, right shoulder  All other systems reviewed and are negative.     Objective:   Physical Exam Vitals and nursing note reviewed.  Constitutional:      Appearance: Normal appearance.  Cardiovascular:     Rate and Rhythm: Normal rate and regular rhythm.     Pulses: Normal pulses.     Heart sounds: Normal heart sounds.  Pulmonary:     Effort: Pulmonary effort is normal.     Breath sounds: Normal breath sounds.  Musculoskeletal:     Cervical back: Normal range of motion and neck supple.     Comments: Normal Muscle Bulk and Muscle Testing Reveals:  Upper Extremities: Full ROM and Muscle Strength  5/5 Right AC Joint Tenderness Thoracic Hypersensitivity: T-1-T-4 Mainly Right Side   Lower Extremities : Full ROM and Muscle Strength 5/5 Arises from chair with ease Narrow Based Gait     Skin:    General: Skin is warm and dry.  Neurological:  Mental Status: She is alert  and oriented to person, place, and time.  Psychiatric:        Mood and Affect: Mood normal.        Behavior: Behavior normal.         Assessment & Plan:  1. Lumbar postlaminectomy syndrome status post L5-S1 fusion with chronic S1 radiculopathy. Continue current medication regimen with Nortriptyline. 01/24/2021. Refilled :  Hydrocodone 10/325 mg #145-one every 6 hours as needed for pain, may take an extra tablet on work days only. 01/24/2021. We will continue the opioid monitoring program, this consists of regular clinic visits, examinations, urine drug screen, pill counts as well as use of [place] Controlled Substance Reporting system. A 12 month History has been reviewed on the [place] on 01/24/2021. 2. 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 01/24/2021 3. Insomnia: Continue current medication regimen Ambien. 01/24/2021  4.Chronic Left Shoulder Pain: No complaints today: S/P Left Shoulder Arthroscopy with Rotator Cuff Repair on 05/18/2019 by Dr. Gunnar Fusi : Ortho Following. 01/24/2021 5. Cervicalgia/ Cervical Radiculitis: Continue current medication regimen. Continue to monitor.  She will F/U with her neurologist. 01/24/2021   F/U in 1 month.

## 2021-01-24 NOTE — Telephone Encounter (Signed)
Left message to call back and set up appointment

## 2021-01-24 NOTE — Telephone Encounter (Signed)
Please schedule new ortho appointment with Dr. Zigmund Daniel.

## 2021-01-27 ENCOUNTER — Inpatient Hospital Stay: Payer: BC Managed Care – PPO | Attending: Nurse Practitioner | Admitting: Nurse Practitioner

## 2021-01-27 ENCOUNTER — Telehealth: Payer: Self-pay | Admitting: *Deleted

## 2021-01-27 ENCOUNTER — Inpatient Hospital Stay: Payer: BC Managed Care – PPO

## 2021-01-27 VITALS — BP 121/87 | HR 106 | Temp 97.4°F | Wt 142.6 lb

## 2021-01-27 DIAGNOSIS — Z171 Estrogen receptor negative status [ER-]: Secondary | ICD-10-CM | POA: Diagnosis not present

## 2021-01-27 DIAGNOSIS — Z923 Personal history of irradiation: Secondary | ICD-10-CM | POA: Insufficient documentation

## 2021-01-27 DIAGNOSIS — C50511 Malignant neoplasm of lower-outer quadrant of right female breast: Secondary | ICD-10-CM | POA: Insufficient documentation

## 2021-01-27 DIAGNOSIS — Z87891 Personal history of nicotine dependence: Secondary | ICD-10-CM | POA: Diagnosis not present

## 2021-01-27 DIAGNOSIS — Z9221 Personal history of antineoplastic chemotherapy: Secondary | ICD-10-CM | POA: Insufficient documentation

## 2021-01-27 LAB — TOXASSURE SELECT,+ANTIDEPR,UR

## 2021-01-27 NOTE — Telephone Encounter (Signed)
Urine drug screen for this encounter is consistent for prescribed medication 

## 2021-01-27 NOTE — Progress Notes (Signed)
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   INTERVAL HISTORY: Patient returns to clinic for routine yearly evaluation.  She feels well.  Does not perform self breast exams.  Did not have mammogram last year due to illness. Denies any neurologic complaints. Denies recent fevers or illnesses. No hot flashes, or unintentional weight loss. Denies any easy bleeding or bruising. No melena or hematochezia. Reports good appetite and denies weight loss. Denies chest pain or shortness of breath. Denies any nausea, vomiting, constipation, or diarrhea. Denies urinary complaints. Patient offers no further specific complaints today.  REVIEW OF SYSTEMS:   Review of Systems  Constitutional:  Negative for chills, fever, malaise/fatigue and weight loss.  HENT:  Negative for hearing loss, nosebleeds, sore throat and tinnitus.   Eyes:  Negative for blurred vision and double vision.  Respiratory:  Negative for cough, hemoptysis, shortness of breath and wheezing.   Cardiovascular:  Negative for chest pain, palpitations and leg swelling.  Gastrointestinal:  Negative for abdominal pain, blood in stool, constipation, diarrhea, melena, nausea and vomiting.  Genitourinary:  Negative for dysuria and urgency.  Musculoskeletal:  Negative for back pain, falls, joint pain and myalgias.  Skin:  Negative for itching and rash.  Neurological:  Negative for dizziness, tingling, sensory change, loss of consciousness, weakness and headaches.  Endo/Heme/Allergies:   Negative for environmental allergies. Does not bruise/bleed easily.  Psychiatric/Behavioral:  Negative for depression. The patient is not nervous/anxious and does not have insomnia.   As per HPI. Otherwise, a complete review of systems is negative.  PAST MEDICAL HISTORY: Past Medical History:  Diagnosis Date   BRCA negative    Breast cancer ([place]) 2013   RT LUMPECTOMY with chemo and rad tx   Degenerative disc disease, lumbar    Personal history of chemotherapy 2013   for breast ca   Personal history of radiation therapy 2013   F/U right breast cancer    Radiation 2013   BREAST CA   Status post chemotherapy 2013   BREAST CA    PAST SURGICAL HISTORY: Past Surgical History:  Procedure Laterality Date   BREAST BIOPSY Right 07/2011   invasive ductal carcinoma   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 RAd and chemo tx   BREAST SURGERY     mastectomy Right    partial with lymph node dissection   PORT A CATH REVISION     PORT-A-CATH REMOVAL  11-07-15   Dr Bary Castilla   SHOULDER ARTHROSCOPY WITH ROTATOR CUFF REPAIR Left 05/18/2019   Procedure: SHOULDER ARTHROSCOPY WITH SUBACROMIAL DECOMPRESSION AND DISTAL CLAVICAL EXCISION, INTRAARTICULAR DEBRIDEMENT;  Surgeon: Lovell Sheehan, MD;  Location: [facility];  Service: Orthopedics;  Laterality: Left;   SPINE SURGERY  2008   Dr Joya Salm   TUBAL LIGATION      FAMILY HISTORY Family History  Problem Relation Age of Onset   Diabetes Mother    Hypertension Mother    Pulmonary fibrosis Mother    Cancer Paternal Uncle    Breast cancer Paternal Uncle  21'S   Breast cancer Paternal Aunt        60'S   Other Father        unknown medical history      ADVANCED DIRECTIVES:    HEALTH MAINTENANCE: Social History   Tobacco Use   Smoking status: Former    Packs/day: 1.00    Years: 20.00    Pack years: 20.00    Types: Cigarettes    Quit date: 01/08/2020    Years since quitting: 1.0    Smokeless tobacco: Never  Vaping Use   Vaping Use: Never used  Substance Use Topics   Alcohol use: Not Currently    Comment: socially   Drug use: No    Colonoscopy:  PAP:  Bone density: not indicated  Lipid panel:  Allergies  Allergen Reactions   Penicillins Anaphylaxis   Sulfa Antibiotics Anaphylaxis   Ondansetron     migraines   Other    Zofran [Ondansetron Hcl]     migraines    Current Outpatient Medications  Medication Sig Dispense Refill   hydrochlorothiazide (HYDRODIURIL) 12.5 MG tablet Take 1 tablet (12.5 mg total) by mouth daily. 90 tablet 1   HYDROcodone-acetaminophen (NORCO) 10-325 MG tablet Take 1 tablet by mouth every 6 (six) hours as needed. May take an extra tablet on work days-no more than 5 per day prn 145 tablet 0   ibuprofen (ADVIL,MOTRIN) 800 MG tablet Take 800 mg by mouth every 6 (six) hours as needed (pain).      methocarbamol (ROBAXIN) 500 MG tablet Take 1 tablet (500 mg total) by mouth 2 (two) times daily as needed for muscle spasms. 60 tablet 2   NONFORMULARY OR COMPOUNDED ITEM Use as directed 120 each 1   nortriptyline (PAMELOR) 10 MG capsule Take 3 capsules (30 mg total) by mouth at bedtime. 270 capsule 1   Spacer/Aero-Holding Chambers (AEROCHAMBER MV) inhaler Use as instructed 1 each 2   UNABLE TO FIND Muscle relaxer. Doesn't know the name of it     zolpidem (AMBIEN) 10 MG tablet TAKE 1 TABLET BY MOUTH EVERY DAY AT BEDTIME AS NEEDED FOR INSOMNIA 30 tablet 3   No current facility-administered medications for this visit.    OBJECTIVE: Vitals:   01/27/21 1038  BP: 121/87  Pulse: (!) 106  Temp: (!) 97.4 F (36.3 C)     Body mass index is 25.26 kg/m.    ECOG FS:0 - Asymptomatic  General: Well-developed, well-nourished, no acute distress. Eyes: Pink conjunctiva, anicteric sclera. Lungs: Clear to auscultation bilaterally.  No audible wheezing or coughing Heart: Regular rate and rhythm.  Abdomen: Soft, nontender, nondistended.   Musculoskeletal: No edema, cyanosis, or clubbing. Neuro: Alert, answering all questions appropriately. Cranial nerves grossly intact. Skin: No rashes or petechiae noted. Psych: Normal affect. Breast exam was performed in seated and lying down position. Patient is status post right lumpectomy with a well-healed surgical scar. No evidence of any palpable masses. No evidence of axillary adenopathy. No evidence of any palpable masses or lumps in the left breast. No evidence of left axillary adenopathy  LAB RESULTS:  Lab Results  Component Value Date   NA 140 10/22/2020   K 4.6 10/22/2020   CL 102 10/22/2020   CO2 23 10/22/2020   GLUCOSE 85 10/22/2020   BUN 9 10/22/2020   CREATININE 0.70 10/22/2020   CALCIUM 9.2 10/22/2020   PROT 6.6 01/26/2020   ALBUMIN 3.9 10/22/2020   AST 23 01/26/2020   ALT 26 01/26/2020   ALKPHOS  57 01/26/2020   BILITOT 0.3 01/26/2020   GFRNONAA 107 01/26/2020   GFRAA 124 01/26/2020    Lab Results  Component Value Date   WBC 7.0 01/26/2020   NEUTROABS 4.2 01/26/2020   HGB 14.7 01/26/2020   HCT 42.5 01/26/2020   MCV 94 01/26/2020   PLT 238 01/26/2020     STUDIES: No results found.  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   PLAN:    1. 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 She was BRCA negative. Genetic test results not available for review. Most recent mammogram in July 2020 was reported as BI-RADS Category 2.  We will schedule bilateral screening mammogram today to catch her up.  Previously, CA 27-29 has been followed.  We will check labs today.  Clinically, no indication for breast MRI at this time.  Given the triple negative nature of her tumor and there is no indication for adjuvant hormonal therapy.  Per NCCN guidelines, she  is 9 years out from her diagnosis.  Recommend continued annual surveillance.  2.  Left arm nodule: s/p u/s. Consistent with cyst at 2:00, 2cm from nipple measuring 5 x 4 x 5 mm. Nonpalpable today.   Lab today- ca27.29 Mammogram asap 1 year- lab (ca27.29), mammogram, see Dr. Grayland Ormond  Patient expressed understanding and was in agreement with this plan. She also understands that She can call clinic at any time with any questions, concerns, or complaints.   Verlon Au, NP   01/27/2021 11:37 AM

## 2021-01-28 LAB — CANCER ANTIGEN 27.29: CA 27.29: 27 U/mL (ref 0.0–38.6)

## 2021-01-31 ENCOUNTER — Other Ambulatory Visit: Payer: Self-pay

## 2021-01-31 ENCOUNTER — Ambulatory Visit (INDEPENDENT_AMBULATORY_CARE_PROVIDER_SITE_OTHER): Payer: BC Managed Care – PPO | Admitting: Family Medicine

## 2021-01-31 ENCOUNTER — Telehealth: Payer: Self-pay

## 2021-01-31 ENCOUNTER — Ambulatory Visit
Admission: RE | Admit: 2021-01-31 | Discharge: 2021-01-31 | Disposition: A | Discharge status: Home / Self Care | Payer: BC Managed Care – PPO | Source: Ambulatory Visit | Attending: Family Medicine | Admitting: Family Medicine

## 2021-01-31 ENCOUNTER — Encounter: Payer: Self-pay | Admitting: Family Medicine

## 2021-01-31 ENCOUNTER — Ambulatory Visit
Admission: RE | Admit: 2021-01-31 | Discharge: 2021-01-31 | Disposition: A | Discharge status: Home / Self Care | Payer: BC Managed Care – PPO | Attending: Family Medicine | Admitting: Family Medicine

## 2021-01-31 VITALS — BP 110/80 | HR 82 | Temp 97.9°F | Ht 63.0 in | Wt 139.8 lb

## 2021-01-31 DIAGNOSIS — M79622 Pain in left upper arm: Secondary | ICD-10-CM | POA: Diagnosis not present

## 2021-01-31 DIAGNOSIS — S46219A Strain of muscle, fascia and tendon of other parts of biceps, unspecified arm, initial encounter: Secondary | ICD-10-CM

## 2021-01-31 DIAGNOSIS — R200 Anesthesia of skin: Secondary | ICD-10-CM

## 2021-01-31 DIAGNOSIS — M7989 Other specified soft tissue disorders: Secondary | ICD-10-CM

## 2021-01-31 DIAGNOSIS — M21922 Unspecified acquired deformity of left upper arm: Secondary | ICD-10-CM | POA: Diagnosis not present

## 2021-01-31 DIAGNOSIS — M542 Cervicalgia: Secondary | ICD-10-CM | POA: Diagnosis not present

## 2021-01-31 NOTE — Assessment & Plan Note (Signed)
Patient with left upper extremity mass, uncertain about exact onset or precipitating events this was initially noted 10/2019.  She is left-hand dominant.  Mass is painless, uncertain about change in size from onset to presentation, denies any overt weakness or issues beyond cosmesis.  Her physical exam reveals deformity at the knee left bicep most consistent with "Popeye deformity ", nontender bicipital groove or at the biceps mass, isolated biceps testing reveals 5 -/4+ out of 5 strength, contralateral is 5/5, speeds and Yergason's testing are consistent with weakness alone without pain response, positive Neer's and Hawkins, 5/5 strength that is symmetric in the rotator cuff.  Her overall presentation raises concern for tear of biceps tendon(s), subsequent fatty change of the muscle tissue.  She has recent ultrasound consistent with lipoma at the area of mass.  She does have history of breast cancer.  Given her specific considerations, for further evaluation we will proceed with MRI left shoulder and left humerus without contrast, plain films will be ordered as well.  I have advised her to continue with activity as tolerated, we will coordinate a follow-up visit once imaging studies are complete for further evaluation and discussion of next possible steps both surgical and nonsurgical.

## 2021-01-31 NOTE — Patient Instructions (Signed)
-   Obtain x-rays today (order in system) - Coordinator will contact you in regards to MRI scheduling - Once we receive results, we will contact you in regards to follow-up visit scheduling - Perform activity as tolerated using shoulder/arm symptoms as a guide - Contact us for any questions between now and then

## 2021-01-31 NOTE — Telephone Encounter (Signed)
Copied from [place] [id number]. Topic: General - Other >> Jan 31, 2021 12:15 PM Celene Kras wrote: Reason for CRM: Pt called stating that she needs to update info for Dr. Zigmund Daniel. She states that she advised him that it was her right shoulder worked on, but that she realizes now that it was her left shoulder. Please advise.

## 2021-01-31 NOTE — Progress Notes (Signed)
Primary Care / Sports Medicine Office Visit  Patient Information:  Patient ID: Elizabeth Torres, female DOB: [birth year] Age: 45 y.o. MRN: [id number]   Elizabeth Torres is a pleasant 45 y.o. female presenting with the following:  Chief Complaint  Patient presents with   New Patient (Initial Visit)   Arm Pain    LHD patient, left bicep; noticed 10/2019, no history of trauma; painless, no progression; [place] left upper extremity indicated simple lipoma, subcutaneous tissues measuring 5 4 x 1.8 3.1 cm on 01/31/2020 ordered by Dr. Grayland Ormond. 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 ([facility]) 08/07/2011   Past Medical History:  Diagnosis Date   BRCA negative    Breast cancer ([facility]) 2013   RT LUMPECTOMY with chemo and rad tx   Degenerative disc disease, lumbar    Personal history of chemotherapy 2013   for breast ca   Personal history of radiation therapy 2013   F/U right breast cancer    Radiation 2013   BREAST CA   Status post chemotherapy 2013   BREAST CA   Outpatient Encounter Medications as of 01/31/2021  Medication Sig   hydrochlorothiazide (HYDRODIURIL) 12.5 MG tablet Take 1 tablet (12.5 mg total) by mouth daily.   HYDROcodone-acetaminophen (NORCO) 10-325 MG tablet Take 1 tablet by mouth every 6 (six) hours as needed. May take an extra tablet on work days-no more than 5 per day prn   ibuprofen (ADVIL,MOTRIN) 800 MG  tablet Take 800 mg by mouth every 6 (six) hours as needed (pain).    methocarbamol (ROBAXIN) 500 MG tablet Take 1 tablet (500 mg total) by mouth 2 (two) times daily as needed for muscle spasms.   NONFORMULARY OR COMPOUNDED ITEM Use as directed (Patient taking differently: Use as directed, Nortriptyline compound cream)   nortriptyline (PAMELOR) 10 MG capsule Take 3 capsules (30 mg total) by mouth at bedtime.   Spacer/Aero-Holding Chambers (AEROCHAMBER MV) inhaler Use as instructed   UNABLE TO FIND Muscle relaxer. Doesn't know the name of it   zolpidem (AMBIEN) 10 MG tablet TAKE 1 TABLET BY MOUTH EVERY DAY AT BEDTIME AS NEEDED FOR INSOMNIA   No facility-administered encounter medications on file as of 01/31/2021.   Past Surgical History:  Procedure Laterality Date   BREAST BIOPSY Right 07/2011   invasive ductal carcinoma   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 RAd and chemo tx   BREAST SURGERY     mastectomy Right    partial with lymph node dissection   PORT A CATH REVISION     PORT-A-CATH REMOVAL  11-07-15   Dr Bary Castilla   SHOULDER ARTHROSCOPY WITH ROTATOR CUFF REPAIR Left 05/18/2019   Procedure: SHOULDER ARTHROSCOPY WITH SUBACROMIAL DECOMPRESSION AND DISTAL CLAVICAL EXCISION, INTRAARTICULAR DEBRIDEMENT;  Surgeon: Lovell Sheehan, MD;  Location: [facility];  Service: Orthopedics;  Laterality: Left;   SPINE SURGERY  2008   Dr Joya Salm  TUBAL LIGATION      Vitals:   01/31/21 1041  BP: 110/80  Pulse: 82  Temp: 97.9 F (36.6 C)  SpO2: 98%   Vitals:   01/31/21 1041  Weight: 139 lb 12.8 oz (63.4 kg)  Height: '5\' 3"'  (1.6 m)   Body mass index is 24.76 kg/m.  No results found.   Independent interpretation of notes and tests performed by another provider:   None  Procedures performed:   None  Pertinent History, Exam, Impression, and Recommendations:   Mass of soft tissue of upper arm Patient with left upper extremity mass,  uncertain about exact onset or precipitating events this was initially noted 10/2019.  She is left-hand dominant.  Mass is painless, uncertain about change in size from onset to presentation, denies any overt weakness or issues beyond cosmesis.  Her physical exam reveals deformity at the knee left bicep most consistent with "Popeye deformity ", nontender bicipital groove or at the biceps mass, isolated biceps testing reveals 5 -/4+ out of 5 strength, contralateral is 5/5, speeds and Yergason's testing are consistent with weakness alone without pain response, positive Neer's and Hawkins, 5/5 strength that is symmetric in the rotator cuff.  Her overall presentation raises concern for tear of biceps tendon(s), subsequent fatty change of the muscle tissue.  She has recent ultrasound consistent with lipoma at the area of mass.  She does have history of breast cancer.  Given her specific considerations, for further evaluation we will proceed with MRI left shoulder and left humerus without contrast, plain films will be ordered as well.  I have advised her to continue with activity as tolerated, we will coordinate a follow-up visit once imaging studies are complete for further evaluation and discussion of next possible steps both surgical and nonsurgical.    Orders & Medications No orders of the defined types were placed in this encounter.  Orders Placed This Encounter  Procedures   MR Shoulder Left Wo Contrast   MR HUMERUS LEFT WO CONTRAST   DG Shoulder Left   DG Humerus Left   DG Cervical Spine Complete      Return if symptoms worsen or fail to improve.     Montel Culver, MD   Primary Care Sports Medicine [place]

## 2021-01-31 NOTE — Telephone Encounter (Signed)
For your information. Please advise.

## 2021-01-31 NOTE — Telephone Encounter (Signed)
Spoke to patient on phone, she wanted to confirm laterality of her shoulder arthroscopy and I reviewed with her that the records we have are aligned with her update and correct.

## 2021-02-10 ENCOUNTER — Ambulatory Visit
Admission: RE | Admit: 2021-02-10 | Discharge: 2021-02-10 | Disposition: A | Discharge status: Home / Self Care | Payer: BC Managed Care – PPO | Source: Ambulatory Visit | Attending: Nurse Practitioner | Admitting: Nurse Practitioner

## 2021-02-10 ENCOUNTER — Telehealth: Payer: Self-pay | Admitting: Family Medicine

## 2021-02-10 ENCOUNTER — Other Ambulatory Visit: Payer: Self-pay

## 2021-02-10 DIAGNOSIS — C50511 Malignant neoplasm of lower-outer quadrant of right female breast: Secondary | ICD-10-CM | POA: Insufficient documentation

## 2021-02-10 DIAGNOSIS — M4722 Other spondylosis with radiculopathy, cervical region: Secondary | ICD-10-CM

## 2021-02-10 DIAGNOSIS — Z1231 Encounter for screening mammogram for malignant neoplasm of breast: Secondary | ICD-10-CM | POA: Insufficient documentation

## 2021-02-10 MED ORDER — DICLOFENAC SODIUM 75 MG PO TBEC: 75.0000 mg | DELAYED_RELEASE_TABLET | Freq: Two times a day (BID) | ORAL | 0 refills | Status: DC

## 2021-02-10 NOTE — Telephone Encounter (Signed)
Please advise.  Spoke with patient earlier about X-Ray results and she viewed the message about your response to her recent symptoms of coldness in her arm.

## 2021-02-10 NOTE — Progress Notes (Signed)
Please let patient know that her recent symptoms described (cold from shoulders to fingertips) are most likely linked to her neck x-ray findings but have her continue to monitor symptoms. Once we have the imaging back we can review them at her follow-up and discuss next steps.

## 2021-02-10 NOTE — Telephone Encounter (Signed)
Pt is calling regarding her X- Rays from January 31, 2021. Please advise CB- [phone] (M)

## 2021-02-10 NOTE — Telephone Encounter (Signed)
Left message for patient to return call. Spoke with patient earlier this morning and relayed X-ray results to her due to her being inactive in [facility].  Patient is due for MRIs 02/20/21.

## 2021-02-10 NOTE — Telephone Encounter (Signed)
Pt is calling and would like dr Zigmund Daniel to return her call to discuss her xray results

## 2021-02-10 NOTE — Telephone Encounter (Signed)
    Primary Care / Sports Medicine Telephone Note  Patient Information:  Patient ID: CARLYLE ACHENBACH, female DOB: 05-26-1976 Age: 45 y.o. MRN: [id number]    Spoke to patient at length over the phone to review recent x-rays of neck and shoulder in addition to updates noted on record review of her operative report.   She vocalizes understanding and cites significant neck discomfort, I have advised continued nightly Robaxin and I have prescribed diclofenac for her to dose BID with food x 7 days then transition to PRN dosing. She was advised to hold other NSAIDs while on this.  She has upcoming MRI studies and we will plan to have her return to clinic for follow-up to review the results and next steps.  Montel Culver, MD   Primary Care Sports Medicine [place]

## 2021-02-19 ENCOUNTER — Encounter: Payer: BC Managed Care – PPO | Attending: Physical Medicine and Rehabilitation | Admitting: Registered Nurse

## 2021-02-19 ENCOUNTER — Encounter: Payer: Self-pay | Admitting: Registered Nurse

## 2021-02-19 ENCOUNTER — Other Ambulatory Visit: Payer: Self-pay

## 2021-02-19 VITALS — BP 118/78 | HR 93 | Temp 98.2°F | Ht 63.0 in | Wt 136.6 lb

## 2021-02-19 DIAGNOSIS — M5416 Radiculopathy, lumbar region: Secondary | ICD-10-CM | POA: Diagnosis not present

## 2021-02-19 DIAGNOSIS — G894 Chronic pain syndrome: Secondary | ICD-10-CM | POA: Diagnosis not present

## 2021-02-19 DIAGNOSIS — Z5181 Encounter for therapeutic drug level monitoring: Secondary | ICD-10-CM | POA: Diagnosis not present

## 2021-02-19 DIAGNOSIS — Z79891 Long term (current) use of opiate analgesic: Secondary | ICD-10-CM | POA: Insufficient documentation

## 2021-02-19 DIAGNOSIS — G620 Drug-induced polyneuropathy: Secondary | ICD-10-CM | POA: Insufficient documentation

## 2021-02-19 DIAGNOSIS — M961 Postlaminectomy syndrome, not elsewhere classified: Secondary | ICD-10-CM | POA: Insufficient documentation

## 2021-02-19 DIAGNOSIS — T451X5A Adverse effect of antineoplastic and immunosuppressive drugs, initial encounter: Secondary | ICD-10-CM | POA: Diagnosis not present

## 2021-02-19 MED ORDER — HYDROCODONE-ACETAMINOPHEN 10-325 MG PO TABS: 1.0000 | ORAL_TABLET | Freq: Four times a day (QID) | ORAL | 0 refills | Status: DC | PRN

## 2021-02-19 NOTE — Progress Notes (Signed)
Subjective:    Patient ID: Elizabeth Torres, female    DOB: [birth year], 45 y.o.   MRN: [id number]  HPI: Elizabeth Torres is a 45 y.o. female who returns for follow up appointment for chronic pain and medication refill. She states her pain is located in her  neck radiating into her bilateral shoulders and lower back pain radiating into her bilateral lower extremities. She rates her pain 6. Her current exercise regime is walking and performing stretching exercises.   Ms. Pestka Morphine equivalent is 50.00 MME.   Last UDS was Performed on 01/24/2021, it was consistent.   Pain Inventory Average Pain 6 Pain Right Now 6 My pain is constant and aching  In the last 24 hours, has pain interfered with the following? General activity 7 Relation with others 7 Enjoyment of life 7 What TIME of day is your pain at its worst? evening Sleep (in general) Poor  Pain is worse with: inactivity Pain improves with: rest, heat/ice, and medication Relief from Meds: 5  Family History  Problem Relation Age of Onset   Diabetes Mother    Hypertension Mother    Pulmonary fibrosis Mother    Cancer Paternal Uncle    Breast cancer Paternal Uncle        36'S   Breast cancer Paternal Aunt        44'S   Other Father        unknown medical history   Social History   Socioeconomic History   Marital status: Married    Spouse name: Taronda Comacho   Number of children: 3   Years of education: Not on file   Highest education level: Not on file  Occupational History   Not on file  Tobacco Use   Smoking status: Former    Packs/day: 1.00    Years: 20.00    Pack years: 20.00    Types: Cigarettes    Quit date: 01/08/2020    Years since quitting: 1.1   Smokeless tobacco: Never  Vaping Use   Vaping Use: Never used  Substance and Sexual Activity   Alcohol use: Not Currently   Drug use: Never   Sexual activity: Yes    Partners: Male  Other Topics Concern   Not on file  Social History Narrative   Not on file    Social Determinants of Health   Financial Resource Strain: Not on file  Food Insecurity: Not on file  Transportation Needs: Not on file  Physical Activity: Not on file  Stress: Not on file  Social Connections: Not on file   Past Surgical History:  Procedure Laterality Date   BREAST BIOPSY Right 07/2011   invasive ductal carcinoma   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 RAd and chemo tx   BREAST SURGERY     mastectomy Right    partial with lymph node dissection   PORT A CATH REVISION     PORT-A-CATH REMOVAL  11-07-15   Dr Bary Castilla   SHOULDER ARTHROSCOPY WITH ROTATOR CUFF REPAIR Left 05/18/2019   Procedure: SHOULDER ARTHROSCOPY WITH SUBACROMIAL DECOMPRESSION AND DISTAL CLAVICAL EXCISION, INTRAARTICULAR DEBRIDEMENT;  Surgeon: Lovell Sheehan, MD;  Location: [facility];  Service: Orthopedics;  Laterality: Left;   SPINE SURGERY  2008   Dr Joya Salm   TUBAL LIGATION     Past Surgical History:  Procedure Laterality Date   BREAST BIOPSY Right 07/2011   invasive ductal carcinoma   BREAST LUMPECTOMY Right  08/2011   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 RAd and chemo tx   BREAST SURGERY     mastectomy Right    partial with lymph node dissection   PORT A CATH REVISION     PORT-A-CATH REMOVAL  11-07-15   Dr Bary Castilla   SHOULDER ARTHROSCOPY WITH ROTATOR CUFF REPAIR Left 05/18/2019   Procedure: SHOULDER ARTHROSCOPY WITH SUBACROMIAL DECOMPRESSION AND DISTAL CLAVICAL EXCISION, INTRAARTICULAR DEBRIDEMENT;  Surgeon: Lovell Sheehan, MD;  Location: [facility];  Service: Orthopedics;  Laterality: Left;   SPINE SURGERY  2008   Dr Joya Salm   TUBAL LIGATION     Past Medical History:  Diagnosis Date   BRCA negative    Breast cancer ([place]) 2013   RT LUMPECTOMY with chemo and rad tx   Degenerative disc disease, lumbar    Personal history of chemotherapy 2013   for breast ca   Personal history of radiation therapy 2013    F/U right breast cancer    Radiation 2013   BREAST CA   Status post chemotherapy 2013   BREAST CA   BP 118/78 (BP Location: Right Arm)   Pulse 93   Temp 98.2 F (36.8 C) (Oral)   Ht '5\' 3"'  (1.6 m)   Wt 136 lb 9.6 oz (62 kg)   SpO2 97%   BMI 24.20 kg/m   Opioid Risk Score:   Fall Risk Score:  `1  Depression screen PHQ 2/9  Depression screen [facility] 2/9 01/31/2021 01/24/2021 11/22/2020 10/30/2020 10/22/2020 09/30/2020 08/21/2020  Decreased Interest 0 0 0 0 0 0 0  Down, Depressed, Hopeless 0 0 0 0 0 0 0  PHQ - 2 Score 0 0 0 0 0 0 0  Altered sleeping 0 - - - 0 - -  Tired, decreased energy 0 - - - 0 - -  Change in appetite 0 - - - 0 - -  Feeling bad or failure about yourself  0 - - - 0 - -  Trouble concentrating 0 - - - 0 - -  Moving slowly or fidgety/restless 0 - - - 0 - -  Suicidal thoughts 0 - - - 0 - -  PHQ-9 Score 0 - - - 0 - -  Difficult doing work/chores Not difficult at all - - - - - -  Some recent data might be hidden     Review of Systems  Constitutional: Negative.   HENT: Negative.    Eyes: Negative.   Respiratory: Negative.    Cardiovascular: Negative.   Gastrointestinal: Negative.   Endocrine: Negative.   Genitourinary: Negative.   Musculoskeletal:  Positive for back pain, gait problem and neck pain.       Right and left arm pain , pain in both legs ,   Skin: Negative.   Allergic/Immunologic: Negative.   Psychiatric/Behavioral: Negative.        Objective:   Physical Exam Vitals and nursing note reviewed.  Constitutional:      Appearance: Normal appearance.  Cardiovascular:     Rate and Rhythm: Normal rate and regular rhythm.     Pulses: Normal pulses.     Heart sounds: Normal heart sounds.  Pulmonary:     Effort: Pulmonary effort is normal.     Breath sounds: Normal breath sounds.  Musculoskeletal:     Cervical back: Normal range of motion and neck supple.     Comments: Normal Muscle Bulk and Muscle Testing Reveals:  Upper Extremities: Full ROM and  Muscle Strength  5/5 Bilateral AC Joint Tenderness  Thoracic Hypersensitivity: T-1-T-4 Lower Extremities: Full ROM and Muscle Strength 5/5 Arises from chair with ease Narrow Based  Gait     Skin:    General: Skin is warm and dry.  Neurological:     Mental Status: She is alert and oriented to person, place, and time.  Psychiatric:        Mood and Affect: Mood normal.        Behavior: Behavior normal.         Assessment & Plan:  1. Lumbar postlaminectomy syndrome status post L5-S1 fusion with chronic S1 radiculopathy. Continue current medication regimen with Nortriptyline. 02/19/2021. Refilled :  Hydrocodone 10/325 mg #145-one every 6 hours as needed for pain, may take an extra tablet on work days only. 02/19/2021. We will continue the opioid monitoring program, this consists of regular clinic visits, examinations, urine drug screen, pill counts as well as use of [place] Controlled Substance Reporting system. A 12 month History has been reviewed on the [facility] on 02/19/2021. 2. 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 02/19/2021 3. Insomnia: Continue current medication regimen Ambien. 02/19/2021  4.Chronic Left Shoulder Pain: No complaints today: S/P Left Shoulder Arthroscopy with Rotator Cuff Repair on 05/18/2019 by Dr. Gunnar Fusi : Ortho Following. 02/19/2021 5. Cervicalgia/ Cervical Radiculitis: Ms. Bosch reports she is scheduled for MRI: Continue current medication regimen. Continue to monitor.   Ortho Following   F/U in 1 month.

## 2021-02-20 ENCOUNTER — Other Ambulatory Visit: Payer: Self-pay

## 2021-02-20 ENCOUNTER — Ambulatory Visit
Admission: RE | Admit: 2021-02-20 | Discharge: 2021-02-20 | Disposition: A | Discharge status: Home / Self Care | Payer: BC Managed Care – PPO | Source: Ambulatory Visit | Attending: Family Medicine | Admitting: Family Medicine

## 2021-02-20 DIAGNOSIS — M7989 Other specified soft tissue disorders: Secondary | ICD-10-CM

## 2021-02-20 DIAGNOSIS — S46219A Strain of muscle, fascia and tendon of other parts of biceps, unspecified arm, initial encounter: Secondary | ICD-10-CM | POA: Diagnosis not present

## 2021-02-20 DIAGNOSIS — M25512 Pain in left shoulder: Secondary | ICD-10-CM | POA: Diagnosis not present

## 2021-02-20 DIAGNOSIS — R600 Localized edema: Secondary | ICD-10-CM | POA: Diagnosis not present

## 2021-02-21 ENCOUNTER — Telehealth: Payer: Self-pay

## 2021-02-21 NOTE — Telephone Encounter (Signed)
-----   Message from Montel Culver, MD sent at 02/21/2021  9:47 AM EDT ----- Patient needs visit to review results of shoulder and neck

## 2021-02-21 NOTE — Progress Notes (Signed)
Patient needs visit to review results of shoulder and neck

## 2021-02-21 NOTE — Telephone Encounter (Signed)
Please schedule follow-up with Dr. Zigmund Daniel to review recent X-Rays.

## 2021-02-21 NOTE — Telephone Encounter (Signed)
Pt scheduled for 02/25/21

## 2021-02-25 ENCOUNTER — Ambulatory Visit (INDEPENDENT_AMBULATORY_CARE_PROVIDER_SITE_OTHER): Payer: BC Managed Care – PPO | Admitting: Family Medicine

## 2021-02-25 ENCOUNTER — Encounter: Payer: Self-pay | Admitting: Family Medicine

## 2021-02-25 ENCOUNTER — Other Ambulatory Visit: Payer: Self-pay

## 2021-02-25 VITALS — BP 96/64 | HR 84 | Temp 98.1°F | Ht 63.0 in | Wt 135.0 lb

## 2021-02-25 DIAGNOSIS — M4722 Other spondylosis with radiculopathy, cervical region: Secondary | ICD-10-CM

## 2021-02-25 DIAGNOSIS — M7989 Other specified soft tissue disorders: Secondary | ICD-10-CM | POA: Diagnosis not present

## 2021-02-25 DIAGNOSIS — M7542 Impingement syndrome of left shoulder: Secondary | ICD-10-CM | POA: Diagnosis not present

## 2021-02-25 MED ORDER — DULOXETINE HCL 30 MG PO CPEP: 30.0000 mg | ORAL_CAPSULE | Freq: Every day | ORAL | 0 refills | Status: DC

## 2021-02-25 NOTE — Patient Instructions (Signed)
-   Start Cymbalta (duloxetine) nightly - Contact us for any issues with medication - Continue diclofenac every AM and PM (with food) - Start PT (order placed) - Return in 2 weeks - Continue current / previous medications - Contact for questions

## 2021-02-25 NOTE — Assessment & Plan Note (Signed)
Patient with recent MRI evidence of supraspinatus tendinosis, her physical exam findings are consistent with the same though with preserved strength at bilateral rotator cuff, positive Neer's and positive Hawkins.  This is also in the setting of comorbid cervical spondylosis related symptoms with radiculopathy.  Plan for scheduled diclofenac, continue dosing of her previous narcotics, initiation of duloxetine, initiation of physical therapy.  She will return for follow-up in 2 weeks, if symptoms are persistent and focal to the shoulder, ultrasound-guided subacromial injection can be considered.

## 2021-02-25 NOTE — Assessment & Plan Note (Signed)
Patient with known cervical spondylosis and endorsing bilateral radicular features, mostly noted upon awakening.  This is in the setting of concomitant left shoulder pain.  Her physical exam is consistent with limited range of motion actively due to pain, significant paraspinal muscular spasm and tenderness primarily localized to the upper trapezius.  Bilateral upper extremity strength is preserved and sensation is intact, Spurling's testing is negative bilaterally.  I discussed the relation of cervical and shoulder symptomatology and we have agreed upon a plan moving forward to include formal physical therapy, continued scheduled diclofenac, continued narcotic regimen, and initiation of duloxetine 30 mg nightly.  She is to return for reevaluation in 2 weeks and possible duloxetine titration at that time.

## 2021-02-25 NOTE — Assessment & Plan Note (Signed)
Recent MRI evidence is consistent with her surgical history revealing long head of the biceps with denervation edema and mild atrophy.  She is understanding of this and its link to her previous surgery.  From functional standpoint I have advised physical therapy which she is amenable to, if cosmesis concerns persist, further evaluation by plastics/orthopedics to be considered.

## 2021-02-25 NOTE — Progress Notes (Signed)
Primary Care / Sports Medicine Office Visit  Patient Information:  Patient ID: CALIAH KOPKE, female DOB: [birth year] Age: 45 y.o. MRN: [id number]   NIRVANA BLANCHETT is a pleasant 45 y.o. female presenting with the following:  Chief Complaint  Patient presents with   Follow-up   Mass of soft tissue of upper arm    Left; X-Ray 01/31/21; MRI 02/20/21; bilateral shoulder and neck pain 7/10    Review of Systems pertinent details above   Patient Active Problem List   Diagnosis Date Noted   Cervical spondylosis with radiculopathy 02/25/2021   Mass of soft tissue of upper arm 01/31/2021   Elevated blood-pressure reading, without diagnosis of hypertension 08/26/2020   Menorrhagia with irregular cycle 04/02/2020   Rotator cuff impingement syndrome, left 12/15/2018   Cervical myofascial pain syndrome 02/14/2016   History of breast cancer 11/07/2015   Postlaminectomy syndrome, lumbar region 04/12/2012   Chemotherapy-induced neuropathy ([place]) 04/12/2012   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 ([place]) 08/07/2011   Past Medical History:  Diagnosis Date   BRCA negative    Breast cancer ([place]) 2013   RT LUMPECTOMY with chemo and rad tx   Degenerative disc disease, lumbar    Personal history of chemotherapy 2013   for breast ca   Personal history of radiation therapy 2013   F/U right breast cancer    Radiation 2013   BREAST CA   Status post chemotherapy 2013   BREAST CA   Outpatient Encounter Medications as of 02/25/2021  Medication Sig   diclofenac (VOLTAREN) 75 MG EC tablet Take 1 tablet (75 mg total) by mouth 2 (two) times daily.   DULoxetine (CYMBALTA) 30 MG capsule Take 1 capsule (30 mg total) by mouth daily.   hydrochlorothiazide (HYDRODIURIL) 12.5 MG tablet Take 1 tablet (12.5 mg total) by mouth daily.   HYDROcodone-acetaminophen (NORCO) 10-325 MG tablet Take 1 tablet by mouth every 6 (six) hours as needed. May take an extra tablet on work days-no more than 5  per day prn   ibuprofen (ADVIL,MOTRIN) 800 MG tablet Take 800 mg by mouth every 6 (six) hours as needed (pain).    methocarbamol (ROBAXIN) 500 MG tablet Take 1 tablet (500 mg total) by mouth 2 (two) times daily as needed for muscle spasms.   NONFORMULARY OR COMPOUNDED ITEM Use as directed (Patient taking differently: Use as directed, Nortriptyline compound cream)   nortriptyline (PAMELOR) 10 MG capsule Take 3 capsules (30 mg total) by mouth at bedtime.   Spacer/Aero-Holding Chambers (AEROCHAMBER MV) inhaler Use as instructed   UNABLE TO FIND Muscle relaxer. Doesn't know the name of it   zolpidem (AMBIEN) 10 MG tablet TAKE 1 TABLET BY MOUTH EVERY DAY AT BEDTIME AS NEEDED FOR INSOMNIA   No facility-administered encounter medications on file as of 02/25/2021.   Past Surgical History:  Procedure Laterality Date   BREAST BIOPSY Right 07/2011   invasive ductal carcinoma   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 RAd and chemo tx   BREAST SURGERY     mastectomy Right    partial with lymph node dissection   PORT A CATH REVISION     PORT-A-CATH REMOVAL  11-07-15   Dr Bary Castilla   SHOULDER ARTHROSCOPY WITH ROTATOR CUFF REPAIR Left 05/18/2019   Procedure: SHOULDER ARTHROSCOPY WITH SUBACROMIAL DECOMPRESSION AND DISTAL CLAVICAL EXCISION, INTRAARTICULAR DEBRIDEMENT;  Surgeon: Lovell Sheehan, MD;  Location: [facility];  Service: Orthopedics;  Laterality: Left;   SPINE SURGERY  2008   Dr Joya Salm   TUBAL LIGATION      Vitals:   02/25/21 0918  BP: 96/64  Pulse: 84  Temp: 98.1 F (36.7 C)  SpO2: 98%   Vitals:   02/25/21 0918  Weight: 135 lb (61.2 kg)  Height: '5\' 3"'  (1.6 m)   Body mass index is 23.91 kg/m.  DG Cervical Spine Complete  Result Date: 02/03/2021 CLINICAL DATA:  LEFT UPPER extremity deformity. Suspect biceps tendon tear. RIGHT neck pain radiates into the RIGHT trapezius. Obvious deformity anterior distal humerus about the elbow,  chronic. EXAM: CERVICAL SPINE - COMPLETE 4+ VIEW COMPARISON:  12/18/2015 FINDINGS: There is loss of cervical lordosis. This may be secondary to splinting, soft tissue injury, or positioning. There is disc height loss at C5-6. There is 4 millimeters retrolisthesis of C5 on C diff, increased compared to prior study. Significant disc height loss and uncovertebral spurring also noted at C6-7. There is no acute fracture or subluxation. Prevertebral soft tissues are unremarkable. Lung apices are clear. IMPRESSION: Mid cervical degenerative changes.  Loss of cervical lordosis. Electronically Signed   By: Nolon Nations M.D.   On: 02/03/2021 13:28   MR HUMERUS LEFT WO CONTRAST  Result Date: 02/20/2021 CLINICAL DATA:  Chronic deformity of the anterior upper arm. History of prior biceps tenotomy in 2020. EXAM: MRI OF THE LEFT HUMERUS WITHOUT CONTRAST TECHNIQUE: Multiplanar, multisequence MR imaging of the left humerus was performed. No intravenous contrast was administered. COMPARISON:  Left humerus x-rays dated January 31, 2021. FINDINGS: Bones/Joint/Cartilage No marrow signal abnormality. No fracture or dislocation. Joint spaces are preserved. No joint effusion. Muscles and Tendons Mild diffuse edema and early atrophy involving the biceps muscle long head. The distal biceps tendon appears intact. Soft tissue No fluid collection or hematoma.  No soft tissue mass. IMPRESSION: 1. Mild denervation edema and early atrophy involving the biceps muscle long head related to prior biceps tenotomy. Electronically Signed   By: Titus Dubin M.D.   On: 02/20/2021 17:14   MR Shoulder Left Wo Contrast  Result Date: 02/20/2021 CLINICAL DATA:  Left shoulder pain. History of prior subacromial decompression in 2020. EXAM: MRI OF THE LEFT SHOULDER WITHOUT CONTRAST TECHNIQUE: Multiplanar, multisequence MR imaging of the shoulder was performed. No intravenous contrast was administered. COMPARISON:  Left shoulder x-rays dated January 31, 2021. FINDINGS: Rotator cuff:  Intact rotator cuff.  Mild supraspinatus tendinosis. Muscles: No atrophy or abnormal signal of the muscles of the rotator cuff. Biceps long head:  Prior tenotomy. Acromioclavicular Joint: Prior distal clavicle resection and acromioplasty. No subacromial/subdeltoid bursal fluid. Glenohumeral Joint: No joint effusion. No chondral defect. Labrum: Grossly intact, but evaluation is limited by lack of intraarticular fluid. Bones:  No marrow abnormality, fracture or dislocation. Other: None. IMPRESSION: 1. Intact rotator cuff. Mild supraspinatus tendinosis. 2. Prior biceps tenotomy. 3. Prior distal clavicle resection and acromioplasty. Electronically Signed   By: Titus Dubin M.D.   On: 02/20/2021 17:11   Independent interpretation of notes and tests performed by another provider:   None  Procedures performed:   None  Pertinent History, Exam, Impression, and Recommendations:   Cervical spondylosis with radiculopathy Patient with known cervical spondylosis and endorsing bilateral radicular features, mostly noted upon awakening.  This is in the setting of concomitant left shoulder pain.  Her physical exam is consistent with limited range of motion actively due to pain, significant paraspinal muscular spasm and tenderness primarily localized to the upper trapezius.  Bilateral upper extremity strength is preserved and sensation is intact, Spurling's testing is negative bilaterally.  I discussed the relation of cervical and shoulder symptomatology and we have agreed upon a plan moving forward to include formal physical therapy, continued scheduled diclofenac, continued narcotic regimen, and initiation of duloxetine 30 mg nightly.  She is to return for reevaluation in 2 weeks and possible duloxetine titration at that time.  Rotator cuff impingement syndrome, left Patient with recent MRI evidence of supraspinatus tendinosis, her physical exam findings are consistent with the  same though with preserved strength at bilateral rotator cuff, positive Neer's and positive Hawkins.  This is also in the setting of comorbid cervical spondylosis related symptoms with radiculopathy.  Plan for scheduled diclofenac, continue dosing of her previous narcotics, initiation of duloxetine, initiation of physical therapy.  She will return for follow-up in 2 weeks, if symptoms are persistent and focal to the shoulder, ultrasound-guided subacromial injection can be considered.  Mass of soft tissue of upper arm Recent MRI evidence is consistent with her surgical history revealing long head of the biceps with denervation edema and mild atrophy.  She is understanding of this and its link to her previous surgery.  From functional standpoint I have advised physical therapy which she is amenable to, if cosmesis concerns persist, further evaluation by plastics/orthopedics to be considered.   Orders & Medications Meds ordered this encounter  Medications   DULoxetine (CYMBALTA) 30 MG capsule    Sig: Take 1 capsule (30 mg total) by mouth daily.    Dispense:  30 capsule    Refill:  0   Orders Placed This Encounter  Procedures   Ambulatory referral to Physical Therapy     Return in about 2 weeks (around 03/11/2021) for Right shoulder and neck follow-up.     Montel Culver, MD   Primary Care Sports Medicine [place]

## 2021-03-04 ENCOUNTER — Other Ambulatory Visit: Payer: Self-pay | Admitting: Family Medicine

## 2021-03-04 DIAGNOSIS — M4722 Other spondylosis with radiculopathy, cervical region: Secondary | ICD-10-CM

## 2021-03-10 ENCOUNTER — Ambulatory Visit (INDEPENDENT_AMBULATORY_CARE_PROVIDER_SITE_OTHER): Payer: BC Managed Care – PPO | Admitting: Family Medicine

## 2021-03-10 ENCOUNTER — Other Ambulatory Visit: Payer: Self-pay

## 2021-03-10 ENCOUNTER — Encounter: Payer: Self-pay | Admitting: Family Medicine

## 2021-03-10 VITALS — BP 118/74 | HR 89 | Temp 98.2°F | Ht 63.0 in | Wt 135.0 lb

## 2021-03-10 DIAGNOSIS — M4722 Other spondylosis with radiculopathy, cervical region: Secondary | ICD-10-CM | POA: Diagnosis not present

## 2021-03-10 DIAGNOSIS — M7542 Impingement syndrome of left shoulder: Secondary | ICD-10-CM | POA: Diagnosis not present

## 2021-03-10 MED ORDER — DICLOFENAC SODIUM 75 MG PO TBEC: 75.0000 mg | DELAYED_RELEASE_TABLET | Freq: Two times a day (BID) | ORAL | 0 refills | Status: DC | PRN

## 2021-03-10 MED ORDER — DULOXETINE HCL 60 MG PO CPEP: 60.0000 mg | ORAL_CAPSULE | Freq: Every day | ORAL | 0 refills | Status: DC

## 2021-03-10 NOTE — Assessment & Plan Note (Addendum)
Chronic issue that is stabilizing. See additional assessment(s) for plan details.

## 2021-03-10 NOTE — Patient Instructions (Signed)
-   Increase duloxetine (Cymbalta) to 60 mg nightly - Can dose diclofenac on an as-needed basis (with food up to 2 times per day) - Start physical therapy (numbers below) [facility] Physical Therapy:  [place]:  [place]: [phone]  - Return in 6 weeks, contact for questions

## 2021-03-10 NOTE — Assessment & Plan Note (Addendum)
Chronic issue that is stabilizing. Patient has noted improvement in symptoms subjectively at the neck and left shoulder, objectively she is still quite tender in the paraspinal left musculature primarily, negative Spurlings. Her shoulder examination has improved in regards to cuff strength and range of motion.  Plan to titrate duloxetine to 60 mg, new Rx sent, she can continue diclofenac but on an as-needed basis, and I have stressed the improtance of starting physical therapy (which she plans to shortly). She will return in several weeks for reevaluation, if suboptimal response, can consider imaging / referral to pain & spine.

## 2021-03-10 NOTE — Progress Notes (Signed)
Primary Care / Sports Medicine Office Visit  Patient Information:  Patient ID: Elizabeth Torres, female DOB: [birth year] Age: 45 y.o. MRN: [id number]   Elizabeth Torres is a pleasant 45 y.o. female presenting with the following:  Chief Complaint  Patient presents with   Cervical spondylosis with radiculopathy    Started duloxetine since last visit and is having some relief; has continued diclofenac also; has not yet started physical therapy, but is going to call back to schedule with them; 4/10 pain   Mass of soft tissue of upper arm    Review of Systems pertinent details above   Patient Active Problem List   Diagnosis Date Noted   Cervical spondylosis with radiculopathy 02/25/2021   Mass of soft tissue of upper arm 01/31/2021   Elevated blood-pressure reading, without diagnosis of hypertension 08/26/2020   Menorrhagia with irregular cycle 04/02/2020   Rotator cuff impingement syndrome, left 12/15/2018   Cervical myofascial pain syndrome 02/14/2016   History of breast cancer 11/07/2015   Postlaminectomy syndrome, lumbar region 04/12/2012   Chemotherapy-induced neuropathy ([place]) 04/12/2012   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 ([place]) 08/07/2011   Past Medical History:  Diagnosis Date   BRCA negative    Breast cancer ([place]) 2013   RT LUMPECTOMY with chemo and rad tx   Degenerative disc disease, lumbar    Personal history of chemotherapy 2013   for breast ca   Personal history of radiation therapy 2013   F/U right breast cancer    Radiation 2013   BREAST CA   Status post chemotherapy 2013   BREAST CA   Outpatient Encounter Medications as of 03/10/2021  Medication Sig   hydrochlorothiazide (HYDRODIURIL) 12.5 MG tablet Take 1 tablet (12.5 mg total) by mouth daily.   HYDROcodone-acetaminophen (NORCO) 10-325 MG tablet Take 1 tablet by mouth every 6 (six) hours as needed. May take an extra tablet on work days-no more than 5 per day prn   methocarbamol  (ROBAXIN) 500 MG tablet Take 1 tablet (500 mg total) by mouth 2 (two) times daily as needed for muscle spasms.   NONFORMULARY OR COMPOUNDED ITEM Use as directed (Patient taking differently: Use as directed, Nortriptyline compound cream)   nortriptyline (PAMELOR) 10 MG capsule Take 3 capsules (30 mg total) by mouth at bedtime.   Spacer/Aero-Holding Chambers (AEROCHAMBER MV) inhaler Use as instructed   UNABLE TO FIND Muscle relaxer. Doesn't know the name of it   zolpidem (AMBIEN) 10 MG tablet TAKE 1 TABLET BY MOUTH EVERY DAY AT BEDTIME AS NEEDED FOR INSOMNIA   [DISCONTINUED] diclofenac (VOLTAREN) 75 MG EC tablet Take 1 tablet (75 mg total) by mouth 2 (two) times daily.   [DISCONTINUED] DULoxetine (CYMBALTA) 30 MG capsule Take 1 capsule (30 mg total) by mouth daily.   [DISCONTINUED] ibuprofen (ADVIL,MOTRIN) 800 MG tablet Take 800 mg by mouth every 6 (six) hours as needed (pain).    diclofenac (VOLTAREN) 75 MG EC tablet Take 1 tablet (75 mg total) by mouth 2 (two) times daily as needed.   DULoxetine (CYMBALTA) 60 MG capsule Take 1 capsule (60 mg total) by mouth daily.   No facility-administered encounter medications on file as of 03/10/2021.   Past Surgical History:  Procedure Laterality Date   BREAST BIOPSY Right 07/2011   invasive ductal carcinoma   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 RAd and chemo tx   BREAST SURGERY     mastectomy  Right    partial with lymph node dissection   PORT A CATH REVISION     PORT-A-CATH REMOVAL  11-07-15   Dr Bary Castilla   SHOULDER ARTHROSCOPY WITH ROTATOR CUFF REPAIR Left 05/18/2019   Procedure: SHOULDER ARTHROSCOPY WITH SUBACROMIAL DECOMPRESSION AND DISTAL CLAVICAL EXCISION, INTRAARTICULAR DEBRIDEMENT;  Surgeon: Lovell Sheehan, MD;  Location: [facility];  Service: Orthopedics;  Laterality: Left;   SPINE SURGERY  2008   Dr Joya Salm   TUBAL LIGATION      Vitals:   03/10/21 1043  BP: 118/74  Pulse: 89   Temp: 98.2 F (36.8 C)  SpO2: 97%   Vitals:   03/10/21 1043  Weight: 135 lb (61.2 kg)  Height: $Remove'5\' 3"'aRvRIhA$  (1.6 m)   Body mass index is 23.91 kg/m.  MR HUMERUS LEFT WO CONTRAST  Result Date: 02/20/2021 CLINICAL DATA:  Chronic deformity of the anterior upper arm. History of prior biceps tenotomy in 2020. EXAM: MRI OF THE LEFT HUMERUS WITHOUT CONTRAST TECHNIQUE: Multiplanar, multisequence MR imaging of the left humerus was performed. No intravenous contrast was administered. COMPARISON:  Left humerus x-rays dated January 31, 2021. FINDINGS: Bones/Joint/Cartilage No marrow signal abnormality. No fracture or dislocation. Joint spaces are preserved. No joint effusion. Muscles and Tendons Mild diffuse edema and early atrophy involving the biceps muscle long head. The distal biceps tendon appears intact. Soft tissue No fluid collection or hematoma.  No soft tissue mass. IMPRESSION: 1. Mild denervation edema and early atrophy involving the biceps muscle long head related to prior biceps tenotomy. Electronically Signed   By: Titus Dubin M.D.   On: 02/20/2021 17:14   MR Shoulder Left Wo Contrast  Result Date: 02/20/2021 CLINICAL DATA:  Left shoulder pain. History of prior subacromial decompression in 2020. EXAM: MRI OF THE LEFT SHOULDER WITHOUT CONTRAST TECHNIQUE: Multiplanar, multisequence MR imaging of the shoulder was performed. No intravenous contrast was administered. COMPARISON:  Left shoulder x-rays dated January 31, 2021. FINDINGS: Rotator cuff:  Intact rotator cuff.  Mild supraspinatus tendinosis. Muscles: No atrophy or abnormal signal of the muscles of the rotator cuff. Biceps long head:  Prior tenotomy. Acromioclavicular Joint: Prior distal clavicle resection and acromioplasty. No subacromial/subdeltoid bursal fluid. Glenohumeral Joint: No joint effusion. No chondral defect. Labrum: Grossly intact, but evaluation is limited by lack of intraarticular fluid. Bones:  No marrow abnormality, fracture or  dislocation. Other: None. IMPRESSION: 1. Intact rotator cuff. Mild supraspinatus tendinosis. 2. Prior biceps tenotomy. 3. Prior distal clavicle resection and acromioplasty. Electronically Signed   By: Titus Dubin M.D.   On: 02/20/2021 17:11   MM 3D SCREEN BREAST BILATERAL  Result Date: 02/12/2021 CLINICAL DATA:  Screening. EXAM: DIGITAL SCREENING BILATERAL MAMMOGRAM WITH TOMOSYNTHESIS AND CAD TECHNIQUE: Bilateral screening digital craniocaudal and mediolateral oblique mammograms were obtained. Bilateral screening digital breast tomosynthesis was performed. The images were evaluated with computer-aided detection. COMPARISON:  Previous exam(s). ACR Breast Density Category c: The breast tissue is heterogeneously dense, which may obscure small masses. FINDINGS: There are no findings suspicious for malignancy. IMPRESSION: No mammographic evidence of malignancy. A result letter of this screening mammogram will be mailed directly to the patient. RECOMMENDATION: Screening mammogram in one year. (Code:SM-B-01Y) BI-RADS CATEGORY  1: Negative. Electronically Signed   By: Ammie Ferrier M.D.   On: 02/12/2021 13:12    Independent interpretation of notes and tests performed by another provider:   None  Procedures performed:   None  Pertinent History, Exam, Impression, and Recommendations:   Cervical spondylosis with radiculopathy Chronic issue  that is stabilizing. Patient has noted improvement in symptoms subjectively at the neck and left shoulder, objectively she is still quite tender in the paraspinal left musculature primarily, negative Spurlings. Her shoulder examination has improved in regards to cuff strength and range of motion.  Plan to titrate duloxetine to 60 mg, new Rx sent, she can continue diclofenac but on an as-needed basis, and I have stressed the improtance of starting physical therapy (which she plans to shortly). She will return in several weeks for reevaluation, if suboptimal  response, can consider imaging / referral to pain & spine.  Rotator cuff impingement syndrome, left Chronic issue that is stabilizing. See additional assessment(s) for plan details.    Orders & Medications Meds ordered this encounter  Medications   DULoxetine (CYMBALTA) 60 MG capsule    Sig: Take 1 capsule (60 mg total) by mouth daily.    Dispense:  90 capsule    Refill:  0   diclofenac (VOLTAREN) 75 MG EC tablet    Sig: Take 1 tablet (75 mg total) by mouth 2 (two) times daily as needed.    Dispense:  60 tablet    Refill:  0   No orders of the defined types were placed in this encounter.    Return in about 6 weeks (around 04/21/2021).     Montel Culver, MD   Primary Care Sports Medicine [place]

## 2021-03-19 ENCOUNTER — Other Ambulatory Visit: Payer: Self-pay

## 2021-03-19 ENCOUNTER — Ambulatory Visit (INDEPENDENT_AMBULATORY_CARE_PROVIDER_SITE_OTHER): Payer: BC Managed Care – PPO | Admitting: Podiatry

## 2021-03-19 DIAGNOSIS — Q666 Other congenital valgus deformities of feet: Secondary | ICD-10-CM

## 2021-03-19 DIAGNOSIS — M722 Plantar fascial fibromatosis: Secondary | ICD-10-CM

## 2021-03-19 NOTE — Patient Instructions (Signed)

## 2021-03-19 NOTE — Progress Notes (Signed)
Patient presents today for orthotic pick up. Patient voices no new complaints.  Orthotics were fitted to patient's feet. No discomfort and no rubbing. Patient satisfied with the orthotics.  Orthotics were dispensed to patient with instructions for break in wear and to call the office with any concerns or questions. 

## 2021-03-25 ENCOUNTER — Ambulatory Visit: Payer: BC Managed Care – PPO | Admitting: Registered Nurse

## 2021-03-26 ENCOUNTER — Other Ambulatory Visit: Payer: Self-pay | Admitting: Family Medicine

## 2021-03-26 DIAGNOSIS — M4722 Other spondylosis with radiculopathy, cervical region: Secondary | ICD-10-CM

## 2021-04-01 ENCOUNTER — Encounter: Payer: BC Managed Care – PPO | Attending: Physical Medicine and Rehabilitation | Admitting: Registered Nurse

## 2021-04-01 ENCOUNTER — Encounter: Payer: Self-pay | Admitting: Registered Nurse

## 2021-04-01 ENCOUNTER — Other Ambulatory Visit: Payer: Self-pay

## 2021-04-01 VITALS — BP 119/87 | HR 93 | Temp 98.4°F | Ht 63.0 in | Wt 132.2 lb

## 2021-04-01 DIAGNOSIS — M961 Postlaminectomy syndrome, not elsewhere classified: Secondary | ICD-10-CM

## 2021-04-01 DIAGNOSIS — M5412 Radiculopathy, cervical region: Secondary | ICD-10-CM

## 2021-04-01 DIAGNOSIS — G894 Chronic pain syndrome: Secondary | ICD-10-CM | POA: Diagnosis not present

## 2021-04-01 DIAGNOSIS — M542 Cervicalgia: Secondary | ICD-10-CM | POA: Diagnosis not present

## 2021-04-01 DIAGNOSIS — T451X5A Adverse effect of antineoplastic and immunosuppressive drugs, initial encounter: Secondary | ICD-10-CM | POA: Diagnosis not present

## 2021-04-01 DIAGNOSIS — Z79891 Long term (current) use of opiate analgesic: Secondary | ICD-10-CM

## 2021-04-01 DIAGNOSIS — M7918 Myalgia, other site: Secondary | ICD-10-CM

## 2021-04-01 DIAGNOSIS — G620 Drug-induced polyneuropathy: Secondary | ICD-10-CM | POA: Diagnosis not present

## 2021-04-01 DIAGNOSIS — M5416 Radiculopathy, lumbar region: Secondary | ICD-10-CM

## 2021-04-01 DIAGNOSIS — Z5181 Encounter for therapeutic drug level monitoring: Secondary | ICD-10-CM | POA: Diagnosis not present

## 2021-04-01 MED ORDER — HYDROCODONE-ACETAMINOPHEN 10-325 MG PO TABS: 1.0000 | ORAL_TABLET | Freq: Four times a day (QID) | ORAL | 0 refills | Status: DC | PRN

## 2021-04-01 NOTE — Patient Instructions (Addendum)
  Dr Holley Raring and   Dr Wandalee Ferdinand MD Address: [address], [place], [place] [address] Phone: [phone]

## 2021-04-01 NOTE — Progress Notes (Signed)
Subjective:    Patient ID: Elizabeth Torres, female    DOB: Oct 22, 1975, 45 y.o.   MRN: [id number]  HPI: Elizabeth Torres is a 45 y.o. female who returns for follow up appointment for chronic pain and medication refill. She states her pain is located in her neck radiating into her bilateral shoulders and lower back pain radiating into her bilateral lower extremities. She rates her pain 5. Her current exercise regime is walking and performing stretching exercises.  [name] Morphine equivalent is 50.00  MME.   Last UDS was Performed 01/24/2021, it was consistent.    Pain Inventory Average Pain 5 Pain Right Now 5 My pain is constant and aching  In the last 24 hours, has pain interfered with the following? General activity 5 Relation with others 6 Enjoyment of life 6 What TIME of day is your pain at its worst? evening Sleep (in general) Fair  Pain is worse with: sitting, inactivity, and some activites Pain improves with: rest, heat/ice, and medication Relief from Meds: 5  Family History  Problem Relation Age of Onset   Diabetes Mother    Hypertension Mother    Pulmonary fibrosis Mother    Cancer Paternal Uncle    Breast cancer Paternal Uncle        62'S   Breast cancer Paternal Aunt        10'S   Other Father        unknown medical history   Social History   Socioeconomic History   Marital status: Married    Spouse name: Lois Slagel   Number of children: 3   Years of education: Not on file   Highest education level: Not on file  Occupational History   Not on file  Tobacco Use   Smoking status: Former    Packs/day: 1.00    Years: 20.00    Pack years: 20.00    Types: Cigarettes    Quit date: 01/08/2020    Years since quitting: 1.2   Smokeless tobacco: Never  Vaping Use   Vaping Use: Never used  Substance and Sexual Activity   Alcohol use: Not Currently   Drug use: Never   Sexual activity: Yes    Partners: Male  Other Topics Concern   Not on file  Social History  Narrative   Not on file   Social Determinants of Health   Financial Resource Strain: Not on file  Food Insecurity: Not on file  Transportation Needs: Not on file  Physical Activity: Not on file  Stress: Not on file  Social Connections: Not on file   Past Surgical History:  Procedure Laterality Date   BREAST BIOPSY Right 07/2011   invasive ductal carcinoma   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 RAd and chemo tx   BREAST SURGERY     mastectomy Right    partial with lymph node dissection   PORT A CATH REVISION     PORT-A-CATH REMOVAL  11-07-15   Dr Bary Castilla   SHOULDER ARTHROSCOPY WITH ROTATOR CUFF REPAIR Left 05/18/2019   Procedure: SHOULDER ARTHROSCOPY WITH SUBACROMIAL DECOMPRESSION AND DISTAL CLAVICAL EXCISION, INTRAARTICULAR DEBRIDEMENT;  Surgeon: Lovell Sheehan, MD;  Location: [facility];  Service: Orthopedics;  Laterality: Left;   SPINE SURGERY  2008   Dr Joya Salm   TUBAL LIGATION     Past Surgical History:  Procedure Laterality Date   BREAST BIOPSY Right 07/2011   invasive ductal carcinoma  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 RAd and chemo tx   BREAST SURGERY     mastectomy Right    partial with lymph node dissection   PORT A CATH REVISION     PORT-A-CATH REMOVAL  11-07-15   Dr Bary Castilla   SHOULDER ARTHROSCOPY WITH ROTATOR CUFF REPAIR Left 05/18/2019   Procedure: SHOULDER ARTHROSCOPY WITH SUBACROMIAL DECOMPRESSION AND DISTAL CLAVICAL EXCISION, INTRAARTICULAR DEBRIDEMENT;  Surgeon: Lovell Sheehan, MD;  Location: [facility];  Service: Orthopedics;  Laterality: Left;   SPINE SURGERY  2008   Dr Joya Salm   TUBAL LIGATION     Past Medical History:  Diagnosis Date   BRCA negative    Breast cancer ([place]) 2013   RT LUMPECTOMY with chemo and rad tx   Degenerative disc disease, lumbar    Personal history of chemotherapy 2013   for breast ca   Personal history of  radiation therapy 2013   F/U right breast cancer    Radiation 2013   BREAST CA   Status post chemotherapy 2013   BREAST CA   BP 119/87   Pulse 93   Temp 98.4 F (36.9 C)   Ht '5\' 3"'  (1.6 m)   Wt 132 lb 3.2 oz (60 kg)   SpO2 97%   BMI 23.42 kg/m   Opioid Risk Score:   Fall Risk Score:  `1  Depression screen PHQ 2/9  Depression screen [facility] 2/9 03/10/2021 02/25/2021 02/19/2021 01/31/2021 01/24/2021 11/22/2020 10/30/2020  Decreased Interest 0 0 0 0 0 0 0  Down, Depressed, Hopeless 0 0 0 0 0 0 0  PHQ - 2 Score 0 0 0 0 0 0 0  Altered sleeping 0 1 - 0 - - -  Tired, decreased energy 0 1 - 0 - - -  Change in appetite 0 0 - 0 - - -  Feeling bad or failure about yourself  0 0 - 0 - - -  Trouble concentrating 0 0 - 0 - - -  Moving slowly or fidgety/restless 0 0 - 0 - - -  Suicidal thoughts 0 0 - 0 - - -  PHQ-9 Score 0 2 - 0 - - -  Difficult doing work/chores Not difficult at all Not difficult at all - Not difficult at all - - -  Some recent data might be hidden    Review of Systems  Musculoskeletal:  Positive for neck pain.       Pain in the shoulders, legs down to both feet  All other systems reviewed and are negative.     Objective:   Physical Exam Vitals and nursing note reviewed.  Constitutional:      Appearance: Normal appearance.  Neck:     Comments: Cervical Paraspinal Tenderness: C-5-C-6 Cardiovascular:     Rate and Rhythm: Normal rate and regular rhythm.     Pulses: Normal pulses.     Heart sounds: Normal heart sounds.  Pulmonary:     Effort: Pulmonary effort is normal.     Breath sounds: Normal breath sounds.  Musculoskeletal:     Cervical back: Normal range of motion and neck supple.     Comments: Normal Muscle Bulk and Muscle Testing Reveals:  Upper Extremities: Right: Full ROM and Muscle Strength 5/5 Left Upper Extremity: Decreased ROM 90 Degrees and Muscle Strength 5/5 Bilateral AC Joint Tenderness Lumbar Paraspinal Tenderness: L-3- L-5 Lower Extremities: Full  ROM and Muscle Strength 5/5 Arises from chair with ease  Narrow Based  Gait     Skin:    General: Skin is warm and dry.  Neurological:     Mental Status: She is alert and oriented to person, place, and time.  Psychiatric:        Mood and Affect: Mood normal.        Behavior: Behavior normal.         Assessment & Plan:  1. Lumbar postlaminectomy syndrome status post L5-S1 fusion with chronic S1 radiculopathy. Continue current medication regimen with Nortriptyline. 04/01/2021. Refilled :  Hydrocodone 10/325 mg #145-one every 6 hours as needed for pain, may take an extra tablet on work days only. 04/01/2021. We will continue the opioid monitoring program, this consists of regular clinic visits, examinations, urine drug screen, pill counts as well as use of [place] Controlled Substance Reporting system. A 12 month History has been reviewed on the [facility] on 04/01/2021. 2. 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 04/01/2021 3. Insomnia: Continue current medication regimen Ambien. 04/01/2021  4.Chronic Left Shoulder Pain: No complaints today: S/P Left Shoulder Arthroscopy with Rotator Cuff Repair on 05/18/2019 by Dr. Gunnar Fusi : Ortho Following. 04/01/2021 5. Cervicalgia/ Cervical Radiculitis: Ms. Scarpati reports she is scheduled for MRI: Continue current medication regimen. Continue to monitor.   Ortho Following. 04/01/2021     F/U in 1 month.

## 2021-04-03 ENCOUNTER — Other Ambulatory Visit: Payer: Self-pay

## 2021-04-03 ENCOUNTER — Other Ambulatory Visit: Payer: Self-pay | Admitting: Registered Nurse

## 2021-04-03 ENCOUNTER — Other Ambulatory Visit: Payer: Self-pay | Admitting: Family Medicine

## 2021-04-03 ENCOUNTER — Ambulatory Visit: Payer: BC Managed Care – PPO | Attending: Family Medicine

## 2021-04-03 DIAGNOSIS — M25512 Pain in left shoulder: Secondary | ICD-10-CM | POA: Diagnosis not present

## 2021-04-03 DIAGNOSIS — M6281 Muscle weakness (generalized): Secondary | ICD-10-CM

## 2021-04-03 DIAGNOSIS — G8929 Other chronic pain: Secondary | ICD-10-CM

## 2021-04-03 DIAGNOSIS — M542 Cervicalgia: Secondary | ICD-10-CM | POA: Diagnosis not present

## 2021-04-03 DIAGNOSIS — M4722 Other spondylosis with radiculopathy, cervical region: Secondary | ICD-10-CM

## 2021-04-03 NOTE — Patient Instructions (Addendum)
Access Code: [email or web address] URL: [email or web address] Date: 04/03/2021 Prepared by: Roxana Hires  Exercises Supine Chin Tuck - 2 x daily - 7 x weekly - 2 sets - 10 reps - 3s hold Seated Cervical Sidebending Stretch (Mirrored) - 2 x daily - 7 x weekly - 3 reps - 30s hold Seated Gentle Upper Trapezius Stretch (Mirrored) - 2 x daily - 7 x weekly - 3 reps - 30s hold

## 2021-04-03 NOTE — Telephone Encounter (Signed)
Requested medications are due for refill today.  yes  Requested medications are on the active medications list.  yes  Last refill. 03/10/2021  Future visit scheduled.   yes  Notes to clinic.  Rx signed by Dr. Zigmund Daniel. Failed protocol due to lab work.

## 2021-04-03 NOTE — Therapy (Signed)
[facility] [address]. [place], [place], [address] Phone: [phone]   Fax:  [phone]  Physical Therapy Evaluation  Patient Details  Name: Elizabeth Torres MRN: [id number] Date of Birth: 1976-06-08 Referring Provider (PT): Dr. Zigmund Daniel  Encounter Date: 04/03/2021   PT End of Session - 04/04/21 0904     Visit Number 1    Number of Visits 17    Date for PT Re-Evaluation 05/29/21    Authorization Type eval: 04/03/21    PT Start Time 1017    PT Stop Time 1055    PT Time Calculation (min) 38 min    Activity Tolerance Patient tolerated treatment well    Behavior During Therapy [employer] for tasks assessed/performed             Past Medical History:  Diagnosis Date   BRCA negative    Breast cancer ([place]) 2013   RT LUMPECTOMY with chemo and rad tx   Degenerative disc disease, lumbar    Personal history of chemotherapy 2013   for breast ca   Personal history of radiation therapy 2013   F/U right breast cancer    Radiation 2013   BREAST CA   Status post chemotherapy 2013   BREAST CA    Past Surgical History:  Procedure Laterality Date   BREAST BIOPSY Right 07/2011   invasive ductal carcinoma   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 RAd and chemo tx   BREAST SURGERY     mastectomy Right    partial with lymph node dissection   PORT A CATH REVISION     PORT-A-CATH REMOVAL  11-07-15   Dr Bary Castilla   SHOULDER ARTHROSCOPY WITH ROTATOR CUFF REPAIR Left 05/18/2019   Procedure: SHOULDER ARTHROSCOPY WITH SUBACROMIAL DECOMPRESSION AND DISTAL CLAVICAL EXCISION, INTRAARTICULAR DEBRIDEMENT;  Surgeon: Lovell Sheehan, MD;  Location: [facility];  Service: Orthopedics;  Laterality: Left;   SPINE SURGERY  2008   Dr Joya Salm   TUBAL LIGATION      There were no vitals filed for this visit.    Subjective Assessment - 04/03/21 1443     Subjective Neck pain and bilateral shoulder pain (L  worse than R)    Pertinent History Pt reports that she started having neck and bilateral shoulder pain 6 months ago. No known traumatic onset and per medical record pt with known cervical spondylosis. No history of cervical surgery. Pain is worse in her L shoulder and she has a history of L shoulder arthroscopy with rotator cuff repair on 05/18/2019 with Dr. Harlow Mares. Prior to the surgery pt reports severe L shoulder pain for 6-8 months. Per MD notes she underwent recent L shoulder MRI with evidence of supraspinatus tendinosis and detached long head of the biceps with denervation edema and mild atrophy. This consistent with the L bicep mass which appeared approximately 1 year ago. She saw Dr. Zigmund Daniel who started her on duloxetine which she reports has helped with the pain. She is also taking diclofenac and has continued with her previous narcotics which are being used to help with her chronic back pain. If pt is not responsive to physical therapy MD is considering ultrasound-guided subacromial injection. Pt has a history of 2 low back surgeries. 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    Limitations Lifting    Diagnostic tests See history    Patient Stated Goals  Decrease neck and L shoulder pain    Currently in Pain? Yes    Pain Score 6    Best: 5/10, Worst: 8/10   Pain Location Neck    Pain Orientation Right;Left    Pain Descriptors / Indicators Aching    Pain Type Chronic pain    Pain Radiating Towards Into bilateral UE, L worse than the R    Pain Onset More than a month ago    Pain Frequency Constant    Aggravating Factors  lifting, looking up and turning head to the right    Pain Relieving Factors duloxetine, rest, heating pad, diclofenac    Effect of Pain on Daily Activities Limiting                 [facility] PT Assessment - 04/04/21 0902       Assessment   Medical Diagnosis Cervical spondylosis with  radiculopathy, left rotator cuff impingement syndrome    Referring Provider (PT) Dr. Zigmund Daniel    Onset Date/Surgical Date 10/01/20   Approximate   Hand Dominance Left    Next MD Visit "In 3-4 weeks"    Prior Therapy None for this issue      Precautions   Precautions None      Restrictions   Weight Bearing Restrictions No               SUBJECTIVE Onset: Pt reports that she started having neck and bilateral shoulder pain 6 months ago. No known traumatic onset and per medical record pt with known cervical spondylosis. No history of cervical surgery. Pain is worse in her L shoulder and she has a history of L shoulder arthroscopy with rotator cuff repair on 05/18/2019 with Dr. Harlow Mares. Prior to the surgery pt reports severe L shoulder pain for 6-8 months. Per MD notes she underwent recent L shoulder MRI with evidence of supraspinatus tendinosis and detached long head of the biceps with denervation edema and mild atrophy. This consistent with the L bicep mass which appeared approximately 1 year ago. She saw Dr. Zigmund Daniel who started her on duloxetine which she reports has helped with the pain. She is also taking diclofenac and has continued with her previous narcotics which are being used to help with her chronic back pain. If pt is not responsive to physical therapy MD is considering ultrasound-guided subacromial injection. Pt has a history of 2 low back surgeries. 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  Neck pain: Location: bilateral upper trap;  Pain: 6/10 Present, 5/10 Best, 8/10 Worst Aggravating factors: lifting, looking up and turning head to the right Easing factors: duloxetine, rest, heating pad, diclofenac 24 hour pain behavior: worsens as the day progresses Recent neck trauma: No Prior history of neck injury or pain: No Pain quality: pain quality: aching Radiating pain: Yes, into bilateral UE, worse on  the L Numbness/Tingling: Yes, bilateral hands upon waking Follow-up appointment with MD: Yes, in 3-4 weeks Dominant hand: left Imaging: Yes, see history       OBJECTIVE  Mental Status Patient is oriented to person, place and time.  Recent memory is intact.  Remote memory is intact.  Attention span and concentration are intact.  Expressive speech is intact.  Patient's fund of knowledge is within normal limits for educational level.  SENSATION: Deferred   MUSCULOSKELETAL: Tremor: None Bulk: Normal Tone: Normal  Posture Forward head and rounded shoulders, corrected upon cueing  Palpation Patient is exquisitely  tender around entire posterior cervical paraspinal region as well as bilateral upper traps;  Strength R/L 5/4+* Shoulder flexion (anterior deltoid/pec major/coracobrachialis, axillary n. (C5/6) and musculocutaneous n. (C5-7)) 5/4+ Shoulder abduction (deltoid/supraspinatus, axillary/suprascapular n, C5) 5/4 Shoulder external rotation (infraspinatus/teres minor) 5*/5* Shoulder internal rotation (subcapularis/lats/pec major) 5/5 Shoulder extension (posterior deltoid, lats, teres major, axillary/thoracodorsal n.) 5/5 Shoulder horizontal abduction 5/5 Elbow flexion (biceps brachii, brachialis, brachioradialis, musculoskeletal n, C5/6) 5/4 Elbow extension (triceps, radial n, C7) 5/5 Wrist Extension (C6/7) 5/5 Wrist Flexion (C6/7) 5/5 Finger adduction (interossei, ulnar n, T1) Cervical isometrics are strong in all directions, painful R rotation, bilateral lateral flexion;  AROM R/L 22* Cervical Flexion 25* Cervical Extension 22*/25* Cervical Lateral Flexion 35*/22* Cervical Rotation *Indicates pain, overpressure performed unless otherwise indicated Decreased L shoulder flexion   Repeated Movements Pt reports decrease in pain following repeated cervical retraction   Muscle Length Deferred   Passive Accessory Intervertebral Motion (PAIVM) Pt unable to tolerate  mobility assessment with central passive accessory and unilateral passive accessory mobility testing due to excessive pain;   Reflex Testing Deferred  SPECIAL TESTS Spurlings A (ipsilateral lateral flexion/axial compression): R: Positive L: Positive Spurlings B (ipsilateral lateral flexion/contralateral rotation/axial compression): R: Not examined L: Not examined Distraction Test: Negative  Hoffman Sign (cervical cord compression): R: Positive L: Positive ULTT Median: R: Not examined L: Not examined ULTT Ulnar: R: Not examined L: Not examined ULTT Radial: R: Not examined L: Not examined           Objective measurements completed on examination: See above findings.               PT Education - 04/04/21 0904     Education Details Plan of care and HEP    Person(s) Educated Patient    Methods Explanation;Handout    Comprehension Verbalized understanding              PT Short Term Goals - 04/04/21 0912       PT SHORT TERM GOAL #1   Title Pt will be independent with HEP in order to improve strength and decrease neck and shoulder pain in order to improve pain-free function.    Time 4    Period Weeks    Status New    Target Date 05/01/21      PT SHORT TERM GOAL #2   Title Pt will decrease worst neck pain as reported on NPRS by at least 2 points in order to demonstrate clinically significant reduction in neck pain.    Baseline 04/03/21: worst 8/10;    Time 4    Period Weeks    Status New    Target Date 05/01/21               PT Long Term Goals - 04/04/21 0912       PT LONG TERM GOAL #1   Title Pt will demonstrate decrease in NDI by at least 19% in order to demonstrate clinically significant reduction in disability related to neck pain    Baseline 04/03/21: 44%    Time 8    Period Weeks    Status New    Target Date 05/29/21      PT LONG TERM GOAL #2   Title Pt will increase FOTO to at least 61 in order to demonstrate significant improvement in  function related to neck pain    Baseline 04/03/21: 46    Time 8    Period Weeks    Status New  Target Date 05/29/21      PT LONG TERM GOAL #3   Title Pt will report worst neck pain of 4/10 or less on NPRS in order to demonstrate at least 50% improvement in neck pain;    Baseline 04/03/21: worst 8/10    Time 8    Period Weeks    Status New    Target Date 05/29/21      PT LONG TERM GOAL #4   Title Patient will demonstrate pain-free cervical rotation of at least 50 degrees bilaterally in order to improve ability to turn head while driving without increase in pain    Baseline 04/03/21: Cervical rotation R/L: 35/22, both painful;    Time 8    Period Weeks    Status New    Target Date 05/29/21                    Plan - 04/03/21 1048     Clinical Impression Statement Pt is a pleasant 45 year-old female referred for cervical spondylosis with radiculopathy and left rotator cuff impingement.  Evaluation today focused on neck pain however will proceed with additional examination of left shoulder during first follow-up session. Pt with forward head and rounded shoulder posture in sitting but is able to correct with cues. She is exquisitely tender around entire posterior cervical paraspinal region as well as bilateral upper traps and is unable to tolerate passive vertebral mobility assessment centrally or unilaterally due to excessive pain. Her cervical active and passive range of motion is very limited due to pain. L shoulder strength is mostly intact with some mild deficits in flexion, abduction, and ER secondary to pain. Her L shoulder AROM flexion and abduction does appear limited secondary to pain but will assess further at first follow-up session. Positive Hoffman bilaterally but reflex testing deferred on this date. Pt presents with deficits in strength, range of motion, and pain and will benefit from skilled PT services to improve pain-free function.    Personal Factors and  Comorbidities Comorbidity 3+    Comorbidities post-laminectomy syndrome, chemotherapy induced neuropathy, hx of breast CA, L RTC repair    Examination-Activity Limitations Bathing;Caring for Others;Carry;Lift    Examination-Participation Restrictions Cleaning;Community Activity;Driving;Shop    Stability/Clinical Decision Making Unstable/Unpredictable    Clinical Decision Making High    Rehab Potential Good    PT Frequency 2x / week    PT Duration 8 weeks    PT Treatment/Interventions ADLs/Self Care Home Management;Aquatic Therapy;Biofeedback;Canalith Repostioning;Cryotherapy;Electrical Stimulation;Iontophoresis 23m/ml Dexamethasone;Moist Heat;Traction;Ultrasound;Fluidtherapy;Gait training;Stair training;Functional mobility training;Therapeutic activities;Balance training;Neuromuscular re-education;Therapeutic exercise;Patient/family education;Manual techniques;Passive range of motion;Dry needling;Taping;Vestibular;Spinal Manipulations;Joint Manipulations    PT Next Visit Plan Further assessmetn of L shoulder, have pt complete QuickDASH, review HEP, gentle cervical ROM an dSTM    PT Home Exercise Plan Access Code: [email or web address]   Consulted and Agree with Plan of Care Patient             Patient will benefit from skilled therapeutic intervention in order to improve the following deficits and impairments:  Decreased range of motion, Decreased strength, Impaired UE functional use, Pain  Visit Diagnosis: Cervicalgia  Muscle weakness (generalized)  Chronic left shoulder pain     Problem List Patient Active Problem List   Diagnosis Date Noted   Cervical spondylosis with radiculopathy 02/25/2021   Mass of soft tissue of upper arm 01/31/2021   Elevated blood-pressure reading, without diagnosis of hypertension 08/26/2020   Menorrhagia with irregular cycle 04/02/2020   Rotator cuff impingement syndrome, left  12/15/2018   Cervical myofascial pain syndrome 02/14/2016   History of breast  cancer 11/07/2015   Postlaminectomy syndrome, lumbar region 04/12/2012   Chemotherapy-induced neuropathy ([place]) 04/12/2012   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 [place], [place], [address] Phone: [phone]   Fax:  [phone]  Name: Elizabeth Torres MRN: [id number] Date of Birth: 10-Mar-1976

## 2021-04-04 NOTE — Telephone Encounter (Signed)
Refill if appropriate.  Please advise.  

## 2021-04-04 NOTE — Telephone Encounter (Signed)
Please refill #60, she will need a follow-up prior to any additional refills beyond this.

## 2021-04-04 NOTE — Telephone Encounter (Signed)
Noted.  Patient has a follow-up on 04/21/21.

## 2021-04-08 ENCOUNTER — Other Ambulatory Visit: Payer: Self-pay | Admitting: Family Medicine

## 2021-04-08 DIAGNOSIS — M4722 Other spondylosis with radiculopathy, cervical region: Secondary | ICD-10-CM

## 2021-04-09 ENCOUNTER — Other Ambulatory Visit: Payer: Self-pay | Admitting: Family Medicine

## 2021-04-09 DIAGNOSIS — M4722 Other spondylosis with radiculopathy, cervical region: Secondary | ICD-10-CM

## 2021-04-09 NOTE — Telephone Encounter (Signed)
Copied from [place] [id number]. Topic: Quick Communication - Rx Refill/Question >> Apr 09, 2021 11:12 AM Tessa Lerner A wrote: Medication: DULoxetine (CYMBALTA) 60 MG capsule [id number]  Has the patient contacted their pharmacy? Yes.   (Agent: If no, request that the patient contact the pharmacy for the refill.) (Agent: If yes, when and what did the pharmacy advise?)  Preferred Pharmacy (with phone number or street name): [facility] [id number] [facility], [address] AT [place]  Phone:  [phone] Fax:  [phone]  Agent: Please be advised that RX refills may take up to 3 business days. We ask that you follow-up with your pharmacy.

## 2021-04-10 ENCOUNTER — Ambulatory Visit: Payer: Self-pay | Admitting: *Deleted

## 2021-04-10 ENCOUNTER — Other Ambulatory Visit: Payer: Self-pay | Admitting: Family Medicine

## 2021-04-10 DIAGNOSIS — M4722 Other spondylosis with radiculopathy, cervical region: Secondary | ICD-10-CM

## 2021-04-10 MED ORDER — DULOXETINE HCL 60 MG PO CPEP: 60.0000 mg | ORAL_CAPSULE | Freq: Every day | ORAL | 0 refills | Status: DC

## 2021-04-10 NOTE — Telephone Encounter (Signed)
Opened in error   Did not speak with pt

## 2021-04-10 NOTE — Telephone Encounter (Signed)
Prescription sent to the pharmacy electronically today, 04/10/21, via Dr. Zigmund Daniel at 2:32 PM.  For your information.

## 2021-04-10 NOTE — Telephone Encounter (Signed)
Pt called in for a refill on her cymbalta.   The pharmacy told her she did not have any more refills.  She went from 30 mg to 60 mg.  Dr. Rosette Reveal with [facility] needs to write a new rx.

## 2021-04-10 NOTE — Telephone Encounter (Signed)
Called patient to review medication request due to requesting too soon.

## 2021-04-10 NOTE — Telephone Encounter (Signed)
Requested medication (s) are due for refill today:   No however pt said pharmacy informed her she did not have any more refills left  Requested medication (s) are on the active medication list:   yes  Future visit scheduled:   Yes   Last ordered: 03/10/2021 #90, 0 refills  Returned because pharmacy told her she did not have any more refills remaining.     (I sent a phone call note regarding this same pt but came across her in our queue so you will have 2 messages for this same Rx.   Sorry.   Requested Prescriptions  Pending Prescriptions Disp Refills   DULoxetine (CYMBALTA) 60 MG capsule 90 capsule 0    Sig: Take 1 capsule (60 mg total) by mouth daily.     Psychiatry: Antidepressants - SNRI Passed - 04/09/2021 11:52 AM      Passed - Last BP in normal range    BP Readings from Last 1 Encounters:  04/01/21 119/87          Passed - Valid encounter within last 6 months    Recent Outpatient Visits           1 month ago Rotator cuff impingement syndrome, left   [facility] Montel Culver, MD   1 month ago Cervical spondylosis with radiculopathy   [facility] Montel Culver, MD   2 months ago Mass of soft tissue of upper arm   [facility] Montel Culver, MD   5 months ago Essential hypertension   [place], Deanna C, MD   1 year ago Essential hypertension   [place], Deanna C, MD       Future Appointments             In 1 week Zigmund Daniel Earley Abide, MD [facility], [place]   In 2 weeks Juline Patch, MD [facility], [place]   In 2 weeks Bayard Hugger, NP [facility], [place]   In 6 months Juline Patch, MD [facility], [facility]

## 2021-04-15 ENCOUNTER — Other Ambulatory Visit: Payer: Self-pay

## 2021-04-15 ENCOUNTER — Ambulatory Visit: Payer: BC Managed Care – PPO

## 2021-04-15 DIAGNOSIS — M542 Cervicalgia: Secondary | ICD-10-CM

## 2021-04-15 DIAGNOSIS — M6281 Muscle weakness (generalized): Secondary | ICD-10-CM | POA: Diagnosis not present

## 2021-04-15 DIAGNOSIS — M25512 Pain in left shoulder: Secondary | ICD-10-CM | POA: Diagnosis not present

## 2021-04-15 DIAGNOSIS — G8929 Other chronic pain: Secondary | ICD-10-CM

## 2021-04-15 NOTE — Therapy (Signed)
[facility] [address] [place], [place], [address] Phone: [phone]   Fax:  [phone]  Physical Therapy Treatment  Patient Details  Name: Elizabeth Torres MRN: [id number] Date of Birth: 01-Sep-1975 Referring Provider (PT): Dr. Zigmund Daniel   Encounter Date: 04/15/2021   PT End of Session - 04/15/21 1014     Visit Number 2    Number of Visits 17    Date for PT Re-Evaluation 05/29/21    Authorization Type eval: 04/03/21    PT Start Time 1015    PT Stop Time 1100    PT Time Calculation (min) 45 min    Activity Tolerance Patient tolerated treatment well    Behavior During Therapy [facility] for tasks assessed/performed             Past Medical History:  Diagnosis Date   BRCA negative    Breast cancer ([place]) 2013   RT LUMPECTOMY with chemo and rad tx   Degenerative disc disease, lumbar    Personal history of chemotherapy 2013   for breast ca   Personal history of radiation therapy 2013   F/U right breast cancer    Radiation 2013   BREAST CA   Status post chemotherapy 2013   BREAST CA    Past Surgical History:  Procedure Laterality Date   BREAST BIOPSY Right 07/2011   invasive ductal carcinoma   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 RAd and chemo tx   BREAST SURGERY     mastectomy Right    partial with lymph node dissection   PORT A CATH REVISION     PORT-A-CATH REMOVAL  11-07-15   Dr Bary Castilla   SHOULDER ARTHROSCOPY WITH ROTATOR CUFF REPAIR Left 05/18/2019   Procedure: SHOULDER ARTHROSCOPY WITH SUBACROMIAL DECOMPRESSION AND DISTAL CLAVICAL EXCISION, INTRAARTICULAR DEBRIDEMENT;  Surgeon: Lovell Sheehan, MD;  Location: [facility];  Service: Orthopedics;  Laterality: Left;   SPINE SURGERY  2008   Dr Joya Salm   TUBAL LIGATION      There were no vitals filed for this visit.   Subjective Assessment - 04/15/21 1014     Subjective Pt reports 7/10 neck pain upon arrival  today. No change in her L shoulder pain since the initial evaluation. She reports worsening of her pain after performing her home exercises (chin tucks, sidebending stretch, uper trap stretch). No specific questions upon arrival today.    Pertinent History Pt reports that she started having neck and bilateral shoulder pain 6 months ago. No known traumatic onset and per medical record pt with known cervical spondylosis. No history of cervical surgery. Pain is worse in her L shoulder and she has a history of L shoulder arthroscopy with rotator cuff repair on 05/18/2019 with Dr. Harlow Mares. Prior to the surgery pt reports severe L shoulder pain for 6-8 months. Per MD notes she underwent recent L shoulder MRI with evidence of supraspinatus tendinosis and detached long head of the biceps with denervation edema and mild atrophy. This consistent with the L bicep mass which appeared approximately 1 year ago. She saw Dr. Zigmund Daniel who started her on duloxetine which she reports has helped with the pain. She is also taking diclofenac and has continued with her previous narcotics which are being used to help with her chronic back pain. If pt is not responsive to physical therapy MD is considering ultrasound-guided subacromial injection. Pt has a history of 2 low back surgeries. 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    Limitations Lifting    Diagnostic tests See history    Patient Stated Goals Decrease neck and L shoulder pain               TREATMENT   Electrical Stimulation  TENS applied to bilateral cervical spine/upper trap (2 channels) with Empi Continuum unit using default shoulder setting x 10 minutes at pt tolerable intensity of 8.0 on the R side and 5.5 on the L side.  Concurrent moist heat pack on posterior cervical spine. Patient reports no improvement symptoms at end of treatment. No visible skin irritation at end of  session.   Manual Therapy  Pt completed QuickDASH questionnaire (72.8%) Attempted light soft tissue mobilization to posterior cervical paraspinals however patient is exquisitely tender to the lightest touch especially on the left side so unable to complete; Attempted central passive accessory mobilizations to cervical spine however patient is unable to tolerate grade 1 mobilizations; Attempted gentle cervical traction however patient is unable to tolerate secondary to pain;  Attempted gentle cervical passive range of motion however patient is unable to tolerate secondary to pain;   Ther-ex  Long hold cervical isometrics for rotation and lateral flexion bilaterally, 10-second hold x5 each, patient reports increase in pain but is able to complete; Supine cervical active range of motion rotation within a very limited range, multiple bouts towards each direction (added to HEP); Education with patient regarding the etiology of widespread chronic pain with respect to central sensitization.    Pt educated throughout session about proper posture and technique with exercises. Improved exercise technique, movement at target joints, use of target muscles after min to mod verbal, visual, tactile cues.    Attempted light soft tissue mobilization to posterior cervical paraspinals however patient is exquisitely tender to the lightest touch especially on the left side so unable to complete.  Secondary to pain she is also unable to tolerate any cervical passive accessory mobilizations, traction, or gentle passive range of motion.  She reports increase in pain but is able to tolerate very light cervical isometric strengthening.  Patient is able to tolerate very limited range supine cervical active range of motion rotation so added to home program.  Unclear if patient will be able to participate in therapy due to highly irritable pain. Education with patient regarding the etiology of widespread chronic pain with  respect to central sensitization.  Patient completed a QuickDASH questionnaire which revealed 72.8% self-reported disability related to her left shoulder pain. HEP modified during session to include light cervical retractions and pain-free limited cervical ROM rotation.  Patient encouraged to follow-up as scheduled.                           PT Short Term Goals - 04/04/21 0912       PT SHORT TERM GOAL #1   Title Pt will be independent with HEP in order to improve strength and decrease neck and shoulder pain in order to improve pain-free function.    Time 4    Period Weeks    Status New    Target Date 05/01/21      PT SHORT TERM GOAL #2   Title Pt will decrease worst neck pain as reported on NPRS by at least 2 points in order to demonstrate clinically significant reduction in neck pain.    Baseline 04/03/21: worst 8/10;  Time 4    Period Weeks    Status New    Target Date 05/01/21               PT Long Term Goals - 04/15/21 1341       PT LONG TERM GOAL #1   Title Pt will demonstrate decrease in NDI by at least 19% in order to demonstrate clinically significant reduction in disability related to neck pain    Baseline 04/03/21: 44%    Time 8    Period Weeks    Status New    Target Date 05/29/21      PT LONG TERM GOAL #2   Title Pt will increase FOTO to at least 61 in order to demonstrate significant improvement in function related to neck pain    Baseline 04/03/21: 46    Time 8    Period Weeks    Status New    Target Date 05/29/21      PT LONG TERM GOAL #3   Title Pt will report worst neck pain of 4/10 or less on NPRS in order to demonstrate at least 50% improvement in neck pain;    Baseline 04/03/21: worst 8/10    Time 8    Period Weeks    Status New    Target Date 05/29/21      PT LONG TERM GOAL #4   Title Patient will demonstrate pain-free cervical rotation of at least 50 degrees bilaterally in order to improve ability to turn head while driving  without increase in pain    Baseline 04/03/21: Cervical rotation R/L: 35/22, both painful;    Time 8    Period Weeks    Status New    Target Date 05/29/21      PT LONG TERM GOAL #5   Title Pt will decrease quick DASH score to 50% or less in order to demonstrate clinically significant reduction in disability related to L shoulder pain    Baseline 04/15/21: 72.8%    Time 8    Period Weeks    Status New    Target Date 05/29/21                   Plan - 04/15/21 1015     Clinical Impression Statement Attempted light soft tissue mobilization to posterior cervical paraspinals however patient is exquisitely tender to the lightest touch especially on the left side so unable to complete.  Secondary to pain she is also unable to tolerate any cervical passive accessory mobilizations, traction, or gentle passive range of motion.  She reports increase in pain but is able to tolerate very light cervical isometric strengthening.  Patient is able to tolerate very limited range supine cervical active range of motion rotation so added to home program.  Unclear if patient will be able to participate in therapy due to highly irritable pain. Education with patient regarding the etiology of widespread chronic pain with respect to central sensitization.  Patient completed a QuickDASH questionnaire which revealed 72.8% self-reported disability related to her left shoulder pain. HEP modified during session to include light cervical retractions and pain-free limited cervical ROM rotation.  Patient encouraged to follow-up as scheduled.    Personal Factors and Comorbidities Comorbidity 3+    Comorbidities post-laminectomy syndrome, chemotherapy induced neuropathy, hx of breast CA, L RTC repair    Examination-Activity Limitations Bathing;Caring for Others;Carry;Lift    Examination-Participation Restrictions Cleaning;Community Activity;Driving;Shop    Stability/Clinical Decision Making Unstable/Unpredictable     Rehab  Potential Good    PT Frequency 2x / week    PT Duration 8 weeks    PT Treatment/Interventions ADLs/Self Care Home Management;Aquatic Therapy;Biofeedback;Canalith Repostioning;Cryotherapy;Electrical Stimulation;Iontophoresis 60m/ml Dexamethasone;Moist Heat;Traction;Ultrasound;Fluidtherapy;Gait training;Stair training;Functional mobility training;Therapeutic activities;Balance training;Neuromuscular re-education;Therapeutic exercise;Patient/family education;Manual techniques;Passive range of motion;Dry needling;Taping;Vestibular;Spinal Manipulations;Joint Manipulations    PT Next Visit Plan Further assessmetn of L shoulder, have pt complete QuickDASH, review HEP, gentle cervical ROM and STM    PT Home Exercise Plan Access Code: [email or web address]   Consulted and Agree with Plan of Care Patient             Patient will benefit from skilled therapeutic intervention in order to improve the following deficits and impairments:  Decreased range of motion, Decreased strength, Impaired UE functional use, Pain  Visit Diagnosis: Cervicalgia  Muscle weakness (generalized)  Chronic left shoulder pain     Problem List Patient Active Problem List   Diagnosis Date Noted   Cervical spondylosis with radiculopathy 02/25/2021   Mass of soft tissue of upper arm 01/31/2021   Elevated blood-pressure reading, without diagnosis of hypertension 08/26/2020   Menorrhagia with irregular cycle 04/02/2020   Rotator cuff impingement syndrome, left 12/15/2018   Cervical myofascial pain syndrome 02/14/2016   History of breast cancer 11/07/2015   Postlaminectomy syndrome, lumbar region 04/12/2012   [place] 04/12/[place]   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 [place] [address]Phone:  [phone]  Fax:  [phone] Name: HCHASTY RANDALMRN: [id number] of Birth: [birth year]

## 2021-04-21 ENCOUNTER — Ambulatory Visit: Payer: BC Managed Care – PPO

## 2021-04-21 ENCOUNTER — Ambulatory Visit: Payer: Self-pay | Admitting: Family Medicine

## 2021-04-21 ENCOUNTER — Encounter: Payer: Self-pay | Admitting: Family Medicine

## 2021-04-21 ENCOUNTER — Ambulatory Visit (INDEPENDENT_AMBULATORY_CARE_PROVIDER_SITE_OTHER): Payer: BC Managed Care – PPO | Admitting: Family Medicine

## 2021-04-21 ENCOUNTER — Other Ambulatory Visit: Payer: Self-pay

## 2021-04-21 ENCOUNTER — Inpatient Hospital Stay (INDEPENDENT_AMBULATORY_CARE_PROVIDER_SITE_OTHER): Payer: BC Managed Care – PPO | Admitting: Radiology

## 2021-04-21 VITALS — BP 124/72 | HR 82 | Temp 97.9°F | Ht 63.0 in | Wt 133.0 lb

## 2021-04-21 DIAGNOSIS — M7542 Impingement syndrome of left shoulder: Secondary | ICD-10-CM | POA: Diagnosis not present

## 2021-04-21 DIAGNOSIS — M542 Cervicalgia: Secondary | ICD-10-CM | POA: Diagnosis not present

## 2021-04-21 DIAGNOSIS — G8929 Other chronic pain: Secondary | ICD-10-CM | POA: Diagnosis not present

## 2021-04-21 DIAGNOSIS — M6281 Muscle weakness (generalized): Secondary | ICD-10-CM

## 2021-04-21 DIAGNOSIS — M25512 Pain in left shoulder: Secondary | ICD-10-CM | POA: Diagnosis not present

## 2021-04-21 DIAGNOSIS — M4722 Other spondylosis with radiculopathy, cervical region: Secondary | ICD-10-CM | POA: Diagnosis not present

## 2021-04-21 MED ORDER — DULOXETINE HCL 30 MG PO CPEP: 30.0000 mg | ORAL_CAPSULE | Freq: Every day | ORAL | 0 refills | Status: DC

## 2021-04-21 MED ORDER — TRIAMCINOLONE ACETONIDE 40 MG/ML IJ SUSP
40.0000 mg | Freq: Once | INTRAMUSCULAR | Status: AC
  Administered 2021-04-21: 40 mg

## 2021-04-21 NOTE — Assessment & Plan Note (Signed)
Is a chronic issue with continued symptomatology.  He initially noted benefit from duloxetine appears to be somewhat stable though she is still exquisitely symptomatic with attempted range of motion at the left shoulder and interference with daily activities.  She has been compliant with physical therapy though work with them has been limited due to the level of her symptoms.  Examination is consistent with focality to the supraspinatus and demonstrates positive impingement.  To address the symptoms from both a diagnostic and therapeutic standpoint, we did proceed with ultrasound-guided subacromial corticosteroid injection.  Additionally, increased dosage of duloxetine to 90 mg prescribed.  She will continue with physical therapy.  We will coordinate a follow-up after she has had MRI of the cervical spine performed and has seen pain and spine group.

## 2021-04-21 NOTE — Progress Notes (Signed)
Primary Care / Sports Medicine Office Visit  Patient Information:  Patient ID: Elizabeth Torres, female DOB: 31-Jan-1976 Age: 45 y.o. MRN: [id number]   Elizabeth Torres is a pleasant 45 y.o. female presenting with the following:  Chief Complaint  Patient presents with   Rotator cuff impingement syndrome, left    Following with PT with no relief; some relief with diclofenac, methocarbamol, duloxetine, and heat; 7/10 pain   Cervical spondylosis with radiculopathy    Following with PT with no relief; 7/10 pain    Review of Systems pertinent details above   Patient Active Problem List   Diagnosis Date Noted   Cervical spondylosis with radiculopathy 02/25/2021   Mass of soft tissue of upper arm 01/31/2021   Elevated blood-pressure reading, without diagnosis of hypertension 08/26/2020   Menorrhagia with irregular cycle 04/02/2020   Rotator cuff impingement syndrome, left 12/15/2018   Cervical myofascial pain syndrome 02/14/2016   History of breast cancer 11/07/2015   Postlaminectomy syndrome, lumbar region 04/12/2012   Chemotherapy-induced neuropathy ([place]) 04/12/2012   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 ([place]) 08/07/2011   Past Medical History:  Diagnosis Date   BRCA negative    Breast cancer ([place]) 2013   RT LUMPECTOMY with chemo and rad tx   Degenerative disc disease, lumbar    Personal history of chemotherapy 2013   for breast ca   Personal history of radiation therapy 2013   F/U right breast cancer    Radiation 2013   BREAST CA   Status post chemotherapy 2013   BREAST CA   Outpatient Encounter Medications as of 04/21/2021  Medication Sig   diclofenac (VOLTAREN) 75 MG EC tablet TAKE 1 TABLET(75 MG) BY MOUTH TWICE DAILY AS NEEDED   DULoxetine (CYMBALTA) 30 MG capsule Take 1 capsule (30 mg total) by mouth daily.   DULoxetine (CYMBALTA) 60 MG capsule Take 1 capsule (60 mg total) by mouth daily.   hydrochlorothiazide (HYDRODIURIL) 12.5 MG tablet Take 1  tablet (12.5 mg total) by mouth daily.   HYDROcodone-acetaminophen (NORCO) 10-325 MG tablet Take 1 tablet by mouth every 6 (six) hours as needed. May take an extra tablet on work days-no more than 5 per day prn   methocarbamol (ROBAXIN) 500 MG tablet TAKE 1 TABLET(500 MG) BY MOUTH TWICE DAILY AS NEEDED FOR MUSCLE SPASMS   NONFORMULARY OR COMPOUNDED ITEM Use as directed (Patient taking differently: Use as directed, Nortriptyline compound cream)   nortriptyline (PAMELOR) 10 MG capsule Take 3 capsules (30 mg total) by mouth at bedtime.   Spacer/Aero-Holding Chambers (AEROCHAMBER MV) inhaler Use as instructed   zolpidem (AMBIEN) 10 MG tablet TAKE 1 TABLET BY MOUTH EVERY DAY AT BEDTIME AS NEEDED FOR INSOMNIA   [DISCONTINUED] UNABLE TO FIND Muscle relaxer. Doesn't know the name of it   No facility-administered encounter medications on file as of 04/21/2021.   Past Surgical History:  Procedure Laterality Date   BREAST BIOPSY Right 07/2011   invasive ductal carcinoma   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 RAd and chemo tx   BREAST SURGERY     mastectomy Right    partial with lymph node dissection   PORT A CATH REVISION     PORT-A-CATH REMOVAL  11-07-15   Dr Bary Castilla   SHOULDER ARTHROSCOPY WITH ROTATOR CUFF REPAIR Left 05/18/2019   Procedure: SHOULDER ARTHROSCOPY WITH SUBACROMIAL DECOMPRESSION AND DISTAL CLAVICAL EXCISION, INTRAARTICULAR DEBRIDEMENT;  Surgeon: Lovell Sheehan, MD;  Location: [facility];  Service: Orthopedics;  Laterality: Left;   SPINE SURGERY  2008   Dr Joya Salm   TUBAL LIGATION      Vitals:   04/21/21 1042  BP: 124/72  Pulse: 82  Temp: 97.9 F (36.6 C)  SpO2: 99%   Vitals:   04/21/21 1042  Weight: 133 lb (60.3 kg)  Height: '5\' 3"'  (1.6 m)   Body mass index is 23.56 kg/m.  No results found.   Independent interpretation of notes and tests performed by another provider:   None  Procedures performed:    Procedure:  Injection of left subacromial space under ultrasound guidance. Ultrasound guidance utilized for visualization of supraspinatus and deltoid interface, needle placement, confirmation of injectate administration Samsung HS60 device utilized with permanent recording / reporting. Consent obtained and verified. Skin prepped in a sterile fashion. Ethyl chloride spray for topical local analgesia.  Completed without difficulty and tolerated well. Medication: triamcinolone acetonide 40 mg/mL suspension for injection 1 mL total and 2 mL lidocaine 1% without epinephrine utilized for needle placement anesthetic Advised to contact for fevers/chills, erythema, induration, drainage, or persistent bleeding.   Pertinent History, Exam, Impression, and Recommendations:   Cervical spondylosis with radiculopathy This is a known chronic condition with persistent symptomatology.  Patient expresses continued symptoms down the left upper extremity to the fingers.  She has noted mild interval improvement from the medications in addition to recently titrated duloxetine at 60 mg dosing.  That being said, her physical exam continues to demonstrate limited active cervical range of motion, significant tenderness in the paraspinal left greater than right musculature, equivocal Spurling's on the left.    Given her comorbid chronic shoulder symptoms, I have advised advanced imaging of the cervical spine and possible corticosteroid injections through her pain and spine group if indicated.  I have also advised further titration of duloxetine to 90 mg nightly.  She is to continue with physical therapy, home exercises, and can transition to as needed dosing of diclofenac.  We will coordinate a follow-up after she has had the MRI of the cervical spine and has seen pain and spine group.  Rotator cuff impingement syndrome, left Is a chronic issue with continued symptomatology.  He initially noted benefit from duloxetine  appears to be somewhat stable though she is still exquisitely symptomatic with attempted range of motion at the left shoulder and interference with daily activities.  She has been compliant with physical therapy though work with them has been limited due to the level of her symptoms.  Examination is consistent with focality to the supraspinatus and demonstrates positive impingement.  To address the symptoms from both a diagnostic and therapeutic standpoint, we did proceed with ultrasound-guided subacromial corticosteroid injection.  Additionally, increased dosage of duloxetine to 90 mg prescribed.  She will continue with physical therapy.  We will coordinate a follow-up after she has had MRI of the cervical spine performed and has seen pain and spine group.   Orders & Medications Meds ordered this encounter  Medications   DULoxetine (CYMBALTA) 30 MG capsule    Sig: Take 1 capsule (30 mg total) by mouth daily.    Dispense:  90 capsule    Refill:  0   Orders Placed This Encounter  Procedures   [place] LIMITED JOINT SPACE STRUCTURES UP LEFT   MR Cervical Spine Wo Contrast     Return if symptoms worsen or fail to improve.     Montel Culver, MD   Primary Care Sports  [place]

## 2021-04-21 NOTE — Therapy (Signed)
[facility] [address] [place], [place], [address] Phone: [phone]   Fax:  [phone]  Physical Therapy Treatment  Patient Details  Name: Elizabeth Torres MRN: [id number] Date of Birth: May 21, 1976 Referring Provider (PT): Dr. Zigmund Daniel   Encounter Date: 04/21/2021   PT End of Session - 04/21/21 0941     Visit Number 3    Number of Visits 17    Date for PT Re-Evaluation 05/29/21    Authorization Type eval: 04/03/21    PT Start Time 0935    PT Stop Time 1008    PT Time Calculation (min) 33 min    Activity Tolerance Patient tolerated treatment well    Behavior During Therapy [facility] for tasks assessed/performed             Past Medical History:  Diagnosis Date   BRCA negative    Breast cancer ([place]) 2013   RT LUMPECTOMY with chemo and rad tx   Degenerative disc disease, lumbar    Personal history of chemotherapy 2013   for breast ca   Personal history of radiation therapy 2013   F/U right breast cancer    Radiation 2013   BREAST CA   Status post chemotherapy 2013   BREAST CA    Past Surgical History:  Procedure Laterality Date   BREAST BIOPSY Right 07/2011   invasive ductal carcinoma   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 RAd and chemo tx   BREAST SURGERY     mastectomy Right    partial with lymph node dissection   PORT A CATH REVISION     PORT-A-CATH REMOVAL  11-07-15   Dr Bary Castilla   SHOULDER ARTHROSCOPY WITH ROTATOR CUFF REPAIR Left 05/18/2019   Procedure: SHOULDER ARTHROSCOPY WITH SUBACROMIAL DECOMPRESSION AND DISTAL CLAVICAL EXCISION, INTRAARTICULAR DEBRIDEMENT;  Surgeon: Lovell Sheehan, MD;  Location: [place];  Service: Orthopedics;  Laterality: Left;   SPINE SURGERY  2008   Dr Joya Salm   TUBAL LIGATION      There were no vitals filed for this visit.   Subjective Assessment - 04/21/21 0937     Subjective Pt reports 7/10 neck pain upon arrival  today. No change in her L shoulder or neck pain since initiation of PT. She has a follow-up appointment with Dr. Zigmund Daniel later this morning.  No specific questions upon arrival today.    Pertinent History Pt reports that she started having neck and bilateral shoulder pain 6 months ago. No known traumatic onset and per medical record pt with known cervical spondylosis. No history of cervical surgery. Pain is worse in her L shoulder and she has a history of L shoulder arthroscopy with rotator cuff repair on 05/18/2019 with Dr. Harlow Mares. Prior to the surgery pt reports severe L shoulder pain for 6-8 months. Per MD notes she underwent recent L shoulder MRI with evidence of supraspinatus tendinosis and detached long head of the biceps with denervation edema and mild atrophy. This consistent with the L bicep mass which appeared approximately 1 year ago. She saw Dr. Zigmund Daniel who started her on duloxetine which she reports has helped with the pain. She is also taking diclofenac and has continued with her previous narcotics which are being used to help with her chronic back pain. If pt is not responsive to physical therapy MD is considering ultrasound-guided subacromial injection. Pt has a history of 2 low back surgeries. 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    Limitations Lifting    Diagnostic tests See history    Patient Stated Goals Decrease neck and L shoulder pain               TREATMENT    Manual Therapy  Moist heat pack to posterior cervical spine in prone at start of session during interval history x 5 minutes; Light soft tissue mobilization to posterior cervical paraspinals using effleurage and shingling techniques, pt is still very sensitive to touch but is able to tolerate for a short bout today; Prone central passive accessory mobilizations to C2-C7, grade I (extremely light), 20s/bout x 1 bout/level; Prone  unilateral passive accessory mobilizations to C2-C7 bilateral, grade I (extremely light), 20s/bout x 1 bout/level bilaterally; Very gentle cervical traction 20s x 2; Attempted gentle cervical passive range of motion however patient is unable to tolerate secondary to pain;   Ther-ex  Supine cervical active range of motion rotation and lateral flexion within a very limited range, multiple bouts towards each direction; Supine gentle cervical repeated retractions x 10; Supine cervical gentle isometric lateral flexion and rotation bilaterally;   Pt educated throughout session about proper posture and technique with exercises. Improved exercise technique, movement at target joints, use of target muscles after min to mod verbal, visual, tactile cues.    Pt is able to tolerate a little bit more manual therapy during session today however she is still exquisitely tender to the lightest touch. Continued limited range cervical AROM and lateral flexion however this also remains very painful for patient. Contacted patient's MD to notify of difficulty completing therapy due to highly irritable pain. No HEP modifications on this date. Patient encouraged to follow-up as scheduled. Pt will benefit from PT services to address deficits in neck and L shoulder pain in order to return to full function at home with less pain.                           PT Short Term Goals - 04/04/21 0912       PT SHORT TERM GOAL #1   Title Pt will be independent with HEP in order to improve strength and decrease neck and shoulder pain in order to improve pain-free function.    Time 4    Period Weeks    Status New    Target Date 05/01/21      PT SHORT TERM GOAL #2   Title Pt will decrease worst neck pain as reported on NPRS by at least 2 points in order to demonstrate clinically significant reduction in neck pain.    Baseline 04/03/21: worst 8/10;    Time 4    Period Weeks    Status New    Target Date  05/01/21               PT Long Term Goals - 04/15/21 1341       PT LONG TERM GOAL #1   Title Pt will demonstrate decrease in NDI by at least 19% in order to demonstrate clinically significant reduction in disability related to neck pain    Baseline 04/03/21: 44%    Time 8    Period Weeks    Status New    Target Date 05/29/21      PT LONG TERM GOAL #2   Title Pt will increase FOTO to at least 61 in order to demonstrate significant improvement in  function related to neck pain    Baseline 04/03/21: 46    Time 8    Period Weeks    Status New    Target Date 05/29/21      PT LONG TERM GOAL #3   Title Pt will report worst neck pain of 4/10 or less on NPRS in order to demonstrate at least 50% improvement in neck pain;    Baseline 04/03/21: worst 8/10    Time 8    Period Weeks    Status New    Target Date 05/29/21      PT LONG TERM GOAL #4   Title Patient will demonstrate pain-free cervical rotation of at least 50 degrees bilaterally in order to improve ability to turn head while driving without increase in pain    Baseline 04/03/21: Cervical rotation R/L: 35/22, both painful;    Time 8    Period Weeks    Status New    Target Date 05/29/21      PT LONG TERM GOAL #5   Title Pt will decrease quick DASH score to 50% or less in order to demonstrate clinically significant reduction in disability related to L shoulder pain    Baseline 04/15/21: 72.8%    Time 8    Period Weeks    Status New    Target Date 05/29/21                   Plan - 04/21/21 1552     Clinical Impression Statement Pt is able to tolerate a little bit more manual therapy during session today however she is still exquisitely tender to the lightest touch. Continued limited range cervical AROM and lateral flexion however this also remains very painful for patient. Contacted patient's MD to notify of difficulty completing therapy due to highly irritable pain. No HEP modifications on this date. Patient  encouraged to follow-up as scheduled. Pt will benefit from PT services to address deficits in neck and L shoulder pain in order to return to full function at home with less pain.    Personal Factors and Comorbidities Comorbidity 3+    Comorbidities post-laminectomy syndrome, chemotherapy induced neuropathy, hx of breast CA, L RTC repair    Examination-Activity Limitations Bathing;Caring for Others;Carry;Lift    Examination-Participation Restrictions Cleaning;Community Activity;Driving;Shop    Stability/Clinical Decision Making Unstable/Unpredictable    Rehab Potential Good    PT Frequency 2x / week    PT Duration 8 weeks    PT Treatment/Interventions ADLs/Self Care Home Management;Aquatic Therapy;Biofeedback;Canalith Repostioning;Cryotherapy;Electrical Stimulation;Iontophoresis 54m/ml Dexamethasone;Moist Heat;Traction;Ultrasound;Fluidtherapy;Gait training;Stair training;Functional mobility training;Therapeutic activities;Balance training;Neuromuscular re-education;Therapeutic exercise;Patient/family education;Manual techniques;Passive range of motion;Dry needling;Taping;Vestibular;Spinal Manipulations;Joint Manipulations    PT Next Visit Plan Further assessmetn of L shoulder, have pt complete QuickDASH, review HEP, gentle cervical ROM and STM    PT Home Exercise Plan Access Code: [email or web address]   Consulted and Agree with Plan of Care Patient             Patient will benefit from skilled therapeutic intervention in order to improve the following deficits and impairments:  Decreased range of motion, Decreased strength, Impaired UE functional use, Pain  Visit Diagnosis: Cervicalgia  Muscle weakness (generalized)     Problem List Patient Active Problem List   Diagnosis Date Noted   Cervical spondylosis with radiculopathy 02/25/2021   Mass of soft tissue of upper arm 01/31/2021   Elevated blood-pressure reading, without diagnosis of hypertension 08/26/2020   Menorrhagia with irregular cycle  04/02/2020   Rotator cuff impingement  syndrome, left 12/15/2018   Cervical myofascial pain syndrome 02/14/2016   History of breast cancer 11/07/2015   Postlaminectomy syndrome, lumbar region 04/12/2012   Chemotherapy-induced neuropathy ([place]) 04/12/2012   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 ([place]) 08/07/2011   Phillips Grout PT, DPT, GCS  Ebbie Sorenson, PT 04/21/2021, 4:04 PM  [facility] [address] [place], [place], [address] Phone: [phone]   Fax:  [phone]  Name: Elizabeth Torres MRN: [id number] Date of Birth: [birth year]

## 2021-04-21 NOTE — Assessment & Plan Note (Signed)
This is a known chronic condition with persistent symptomatology.  Patient expresses continued symptoms down the left upper extremity to the fingers.  She has noted mild interval improvement from the medications in addition to recently titrated duloxetine at 60 mg dosing.  That being said, her physical exam continues to demonstrate limited active cervical range of motion, significant tenderness in the paraspinal left greater than right musculature, equivocal Spurling's on the left.    Given her comorbid chronic shoulder symptoms, I have advised advanced imaging of the cervical spine and possible corticosteroid injections through her pain and spine group if indicated.  I have also advised further titration of duloxetine to 90 mg nightly.  She is to continue with physical therapy, home exercises, and can transition to as needed dosing of diclofenac.  We will coordinate a follow-up after she has had the MRI of the cervical spine and has seen pain and spine group.

## 2021-04-21 NOTE — Addendum Note (Signed)
Addended by: Forbes Cellar on: 04/21/2021 11:32 AM   Modules accepted: Orders

## 2021-04-21 NOTE — Patient Instructions (Signed)
You have just been given a cortisone injection to reduce pain and inflammation. After the injection you may notice immediate relief of pain as a result of the Lidocaine. It is important to rest the area of the injection for 24 to 48 hours after the injection. There is a possibility of some temporary increased discomfort and swelling for up to 72 hours until the cortisone begins to work. If you do have pain, simply rest the joint and use ice. If you can tolerate over the counter medications, you can try Tylenol, Aleve, or Advil for added relief per package instructions. - As above, relative rest x2 days then gradual return to normal activity - Increase dose of Cymbalta (duloxetine) to 90 mg nightly (dose 60 mg and 30 mg capsule together) - Referral coordinator will contact you for MRI of the neck - Can transition to as needed dosing of diclofenac after 3 days - Continue with PT and home exercises - We will coordinate a follow-up ideally after you have had neck MRI and have seen pain and spine group

## 2021-04-24 ENCOUNTER — Ambulatory Visit: Payer: BC Managed Care – PPO | Admitting: Family Medicine

## 2021-04-28 NOTE — Progress Notes (Signed)
Patient presents today to be casted for custom molded orthotics. Dr. Posey Pronto has been treating patient for pes planovalgus   Patient will be notified once orthotics arrive in office and reappoint for fitting at that time.

## 2021-04-29 ENCOUNTER — Ambulatory Visit
Admission: RE | Admit: 2021-04-29 | Discharge: 2021-04-29 | Disposition: A | Discharge status: Home / Self Care | Payer: BC Managed Care – PPO | Source: Ambulatory Visit | Attending: Family Medicine | Admitting: Family Medicine

## 2021-04-29 ENCOUNTER — Other Ambulatory Visit: Payer: Self-pay

## 2021-04-29 DIAGNOSIS — M4722 Other spondylosis with radiculopathy, cervical region: Secondary | ICD-10-CM | POA: Insufficient documentation

## 2021-04-29 DIAGNOSIS — M542 Cervicalgia: Secondary | ICD-10-CM | POA: Diagnosis not present

## 2021-04-30 ENCOUNTER — Encounter: Payer: BC Managed Care – PPO | Attending: Physical Medicine and Rehabilitation | Admitting: Registered Nurse

## 2021-04-30 ENCOUNTER — Encounter: Payer: Self-pay | Admitting: Registered Nurse

## 2021-04-30 VITALS — BP 124/72 | Ht 63.0 in | Wt 132.0 lb

## 2021-04-30 DIAGNOSIS — G620 Drug-induced polyneuropathy: Secondary | ICD-10-CM | POA: Diagnosis not present

## 2021-04-30 DIAGNOSIS — Z79891 Long term (current) use of opiate analgesic: Secondary | ICD-10-CM | POA: Insufficient documentation

## 2021-04-30 DIAGNOSIS — M961 Postlaminectomy syndrome, not elsewhere classified: Secondary | ICD-10-CM | POA: Insufficient documentation

## 2021-04-30 DIAGNOSIS — M5416 Radiculopathy, lumbar region: Secondary | ICD-10-CM | POA: Insufficient documentation

## 2021-04-30 DIAGNOSIS — Z5181 Encounter for therapeutic drug level monitoring: Secondary | ICD-10-CM | POA: Insufficient documentation

## 2021-04-30 DIAGNOSIS — G894 Chronic pain syndrome: Secondary | ICD-10-CM | POA: Diagnosis not present

## 2021-04-30 DIAGNOSIS — T451X5A Adverse effect of antineoplastic and immunosuppressive drugs, initial encounter: Secondary | ICD-10-CM | POA: Insufficient documentation

## 2021-04-30 MED ORDER — HYDROCODONE-ACETAMINOPHEN 10-325 MG PO TABS: 1.0000 | ORAL_TABLET | Freq: Four times a day (QID) | ORAL | 0 refills | Status: DC | PRN

## 2021-04-30 NOTE — Progress Notes (Addendum)
Subjective:    Patient ID: Elizabeth Torres, female    DOB: [birth year], 45 y.o.   MRN: [id number]  HPI: Elizabeth Torres is a 45 y.o. female who is scheduled for a My-Chart Video Visit today, she agrees with My-Chart Video Visit and verbalizes understanding. She  states her pain is located in her lower back radiating into her bilateral lower extremities. She rates her pain 6. Her current exercise regime is walking and performing stretching exercises.  [name] Morphine equivalent is 50.00 MME.   Last UDS was Performed on 01/24/2021, it was consistent.      Pain Inventory Average Pain 6 Pain Right Now 6 My pain is constant, dull, and aching  In the last 24 hours, has pain interfered with the following? General activity 4 Relation with others 3 Enjoyment of life 2 What TIME of day is your pain at its worst? morning , daytime, evening, night, and varies Sleep (in general) Fair  Pain is worse with: inactivity Pain improves with: rest and medication Relief from Meds: 7  Family History  Problem Relation Age of Onset   Diabetes Mother    Hypertension Mother    Pulmonary fibrosis Mother    Cancer Paternal Uncle    Breast cancer Paternal Uncle        51'S   Breast cancer Paternal Aunt        21'S   Other Father        unknown medical history   Social History   Socioeconomic History   Marital status: Married    Spouse name: Elizabeth Torres   Number of children: 3   Years of education: Not on file   Highest education level: Not on file  Occupational History   Not on file  Tobacco Use   Smoking status: Former    Packs/day: 1.00    Years: 20.00    Pack years: 20.00    Types: Cigarettes    Quit date: 01/08/2020    Years since quitting: 1.3   Smokeless tobacco: Never  Vaping Use   Vaping Use: Never used  Substance and Sexual Activity   Alcohol use: Not Currently   Drug use: Never   Sexual activity: Yes    Partners: Male  Other Topics Concern   Not on file  Social History  Narrative   Not on file   Social Determinants of Health   Financial Resource Strain: Not on file  Food Insecurity: Not on file  Transportation Needs: Not on file  Physical Activity: Not on file  Stress: Not on file  Social Connections: Not on file   Past Surgical History:  Procedure Laterality Date   BREAST BIOPSY Right 07/2011   invasive ductal carcinoma   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 RAd and chemo tx   BREAST SURGERY     mastectomy Right    partial with lymph node dissection   PORT A CATH REVISION     PORT-A-CATH REMOVAL  11-07-15   Dr Elizabeth Torres   SHOULDER ARTHROSCOPY WITH ROTATOR CUFF REPAIR Left 05/18/2019   Procedure: SHOULDER ARTHROSCOPY WITH SUBACROMIAL DECOMPRESSION AND DISTAL CLAVICAL EXCISION, INTRAARTICULAR DEBRIDEMENT;  Surgeon: Elizabeth Sheehan, MD;  Location: [place];  Service: Orthopedics;  Laterality: Left;   SPINE SURGERY  2008   Dr Elizabeth Torres   TUBAL LIGATION     Past Surgical History:  Procedure Laterality Date   BREAST BIOPSY Right 07/2011   invasive ductal  carcinoma   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 RAd and chemo tx   BREAST SURGERY     mastectomy Right    partial with lymph node dissection   PORT A CATH REVISION     PORT-A-CATH REMOVAL  11-07-15   Dr Elizabeth Torres   SHOULDER ARTHROSCOPY WITH ROTATOR CUFF REPAIR Left 05/18/2019   Procedure: SHOULDER ARTHROSCOPY WITH SUBACROMIAL DECOMPRESSION AND DISTAL CLAVICAL EXCISION, INTRAARTICULAR DEBRIDEMENT;  Surgeon: Elizabeth Sheehan, MD;  Location: [place];  Service: Orthopedics;  Laterality: Left;   SPINE SURGERY  2008   Dr Elizabeth Torres   TUBAL LIGATION     Past Medical History:  Diagnosis Date   BRCA negative    Breast cancer ([place]) 2013   RT LUMPECTOMY with chemo and rad tx   Degenerative disc disease, lumbar    Personal history of chemotherapy 2013   for breast ca   Personal history of  radiation therapy 2013   F/U right breast cancer    Radiation 2013   BREAST CA   Status post chemotherapy 2013   BREAST CA   Ht '5\' 3"'  (1.6 m)   Wt 132 lb (59.9 kg)   BMI 23.38 kg/m   Opioid Risk Score:   Fall Risk Score:  `1  Depression screen PHQ 2/9  Depression screen [facility] 2/9 04/21/2021 04/01/2021 03/10/2021 02/25/2021 02/19/2021 01/31/2021 01/24/2021  Decreased Interest 1 0 0 0 0 0 0  Down, Depressed, Hopeless 0 0 0 0 0 0 0  PHQ - 2 Score 1 0 0 0 0 0 0  Altered sleeping 1 - 0 1 - 0 -  Tired, decreased energy 0 - 0 1 - 0 -  Change in appetite 0 - 0 0 - 0 -  Feeling bad or failure about yourself  0 - 0 0 - 0 -  Trouble concentrating 0 - 0 0 - 0 -  Moving slowly or fidgety/restless 0 - 0 0 - 0 -  Suicidal thoughts 0 - 0 0 - 0 -  PHQ-9 Score 2 - 0 2 - 0 -  Difficult doing work/chores Not difficult at all - Not difficult at all Not difficult at all - Not difficult at all -  Some recent data might be hidden    Review of Systems  Musculoskeletal:  Positive for back pain. Negative for gait problem.       Lower back pain and pain in both legs  All other systems reviewed and are negative.     Objective:   Physical Exam Vitals and nursing note reviewed.  Musculoskeletal:     Comments: No Physical Exam Performed: My-Chart Video Visit         Assessment & Plan:  1. Lumbar postlaminectomy syndrome status post L5-S1 fusion with chronic S1 radiculopathy. Continue current medication regimen with Nortriptyline. 04/30/2021. Refilled :  Hydrocodone 10/325 mg #145-one every 6 hours as needed for pain, may take an extra tablet on work days only. 04/30/2021. We will continue the opioid monitoring program, this consists of regular clinic visits, examinations, urine drug screen, pill counts as well as use of [place] Controlled Substance Reporting system. A 12 month History has been reviewed on the [place] on 04/30/2021. 2. 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 04/30/2021 3. Insomnia: Continue current medication regimen Ambien. 04/30/2021  4.Chronic Left Shoulder Pain: No complaints today: S/P  Left Shoulder Arthroscopy with Rotator Cuff Repair on 05/18/2019 by Dr. Gunnar Fusi : Ortho Following. 04/30/2021 5. Cervicalgia/ Cervical Radiculitis: Ms. Meckler reports she had her  MRI: Continue current medication regimen. Continue to monitor.   Ortho Following. 04/30/2021     F/U in 1 month.  My-Chart Video Visit Established Patient Location of Patient: At her Home Location of Provider in the Office

## 2021-05-01 NOTE — Progress Notes (Signed)
Ms. Christian, your cervical spine MRI does show 3 levels of degenerative changes with the disc coming in contact with the nerve roots - these findings can definitely account for your symptoms. Please follow-up with your current pain & spine group for this issue so that they can discuss additional treatments (such as injections, etc) as they see fit. Please reach out for questions.

## 2021-05-02 ENCOUNTER — Telehealth: Payer: Self-pay

## 2021-05-02 ENCOUNTER — Telehealth: Payer: Self-pay | Admitting: Registered Nurse

## 2021-05-02 NOTE — Telephone Encounter (Signed)
Patient had a cervical MRI done by another physician and she would like [place] or Dr. Letta Pate to take a look at it.  Please call patient.

## 2021-05-02 NOTE — Telephone Encounter (Signed)
Copied from [place] [id number]. Topic: General - Other >> May 02, 2021  8:54 AM Tessa Lerner A wrote: Reason for CRM: The patient would like to be contacted regarding their MRI results  They have additional questions that they would like to discuss with a member of clinical staff

## 2021-05-02 NOTE — Telephone Encounter (Signed)
Dr Letta Pate,  Ms. Nylander had a MRI, she would like for you to review nd give your recommendations. Her spine doctor told her he doesn't deal with her issue.  Her complaints is neck pain radiating into her bilateral shoulders L>R, radiating into her left upper extremity with left hand numbness.

## 2021-05-02 NOTE — Telephone Encounter (Signed)
Patient viewed results sent on MyChart.  Are you able to contact patient to answer additional questions?

## 2021-05-02 NOTE — Telephone Encounter (Signed)
Spoke to patient over phone today at length to review MRI results and I have advised her to schedule a separate visit with her current pain & spine group to discuss treatment options for the cervical spine given MRI results. She vocalizes understanding and will call them to schedule accordingly.

## 2021-05-03 ENCOUNTER — Other Ambulatory Visit: Payer: Self-pay | Admitting: Family Medicine

## 2021-05-03 DIAGNOSIS — M4722 Other spondylosis with radiculopathy, cervical region: Secondary | ICD-10-CM

## 2021-05-03 NOTE — Telephone Encounter (Signed)
Requested medications are due for refill today yes  Requested medications are on the active medication list yes  Last refill 9/2  Last visit 9/8 for this med, seen 9/19 for rotator cuff pain  Future visit scheduled 05/16/21  Notes to clinic OV note unclear as to if pt was to stay on medication, please assess.

## 2021-05-05 NOTE — Telephone Encounter (Signed)
Please review. Dr. Zigmund Daniel prescribed.  KP

## 2021-05-05 NOTE — Telephone Encounter (Signed)
Dose every 12 hours as-needed.

## 2021-05-14 IMAGING — US US EXTREM UP*L* LTD
1 series · 9 of 9 positions shown · non-contrast
Comparison: None.

CLINICAL DATA: Left upper arm mass in a patient with a history of
breast cancer.

EXAM:
ULTRASOUND LEFT UPPER EXTREMITY LIMITED
TECHNIQUE: Ultrasound examination of the upper extremity soft tissues was
performed in the area of clinical concern.

[Series 1: us extrem up*left* ltd · 0.08mm/px · 9 acquisitions, 9 frames shown]
[im 1/9]
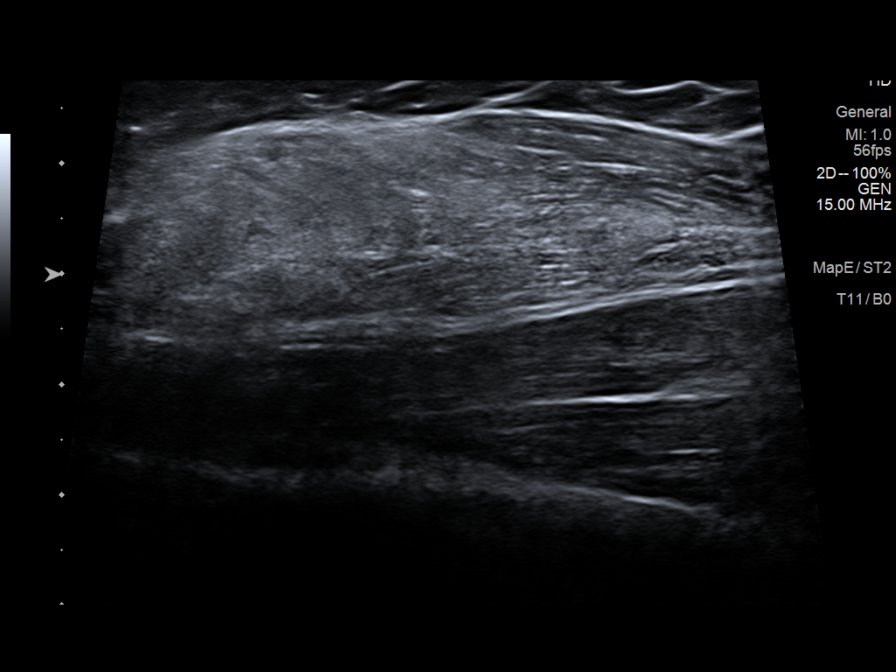
[im 2/9]
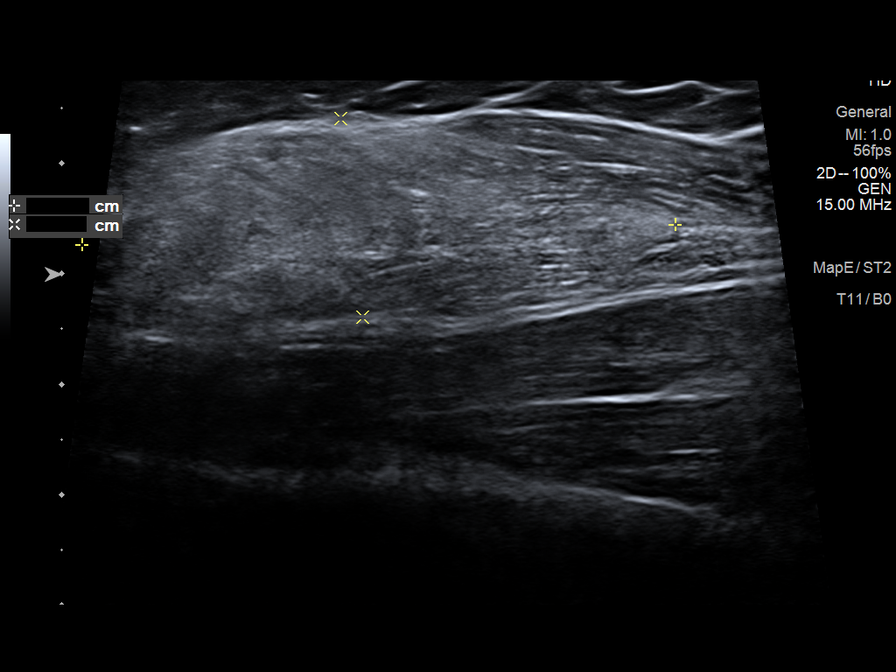
[im 3/9]
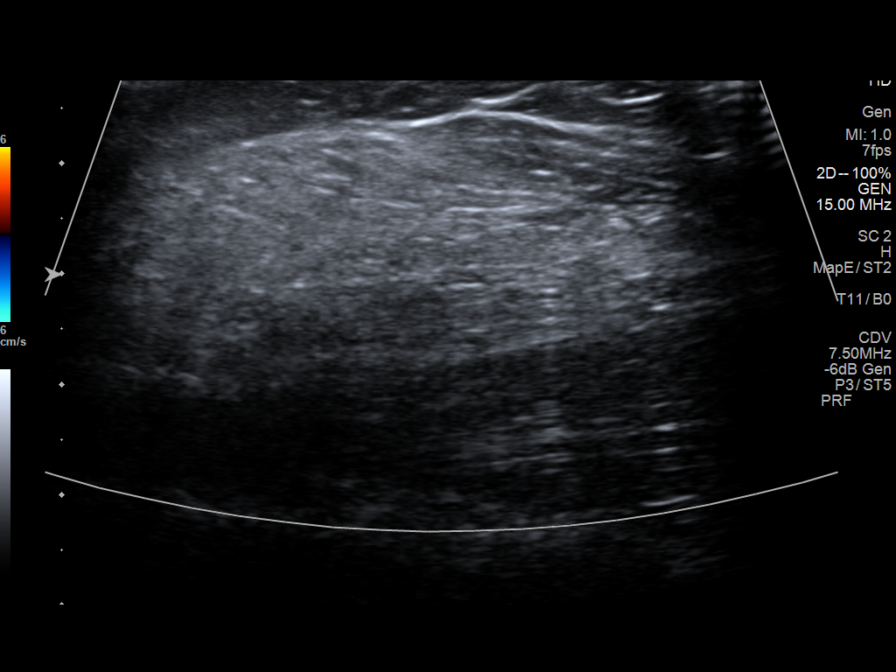
[im 4/9]
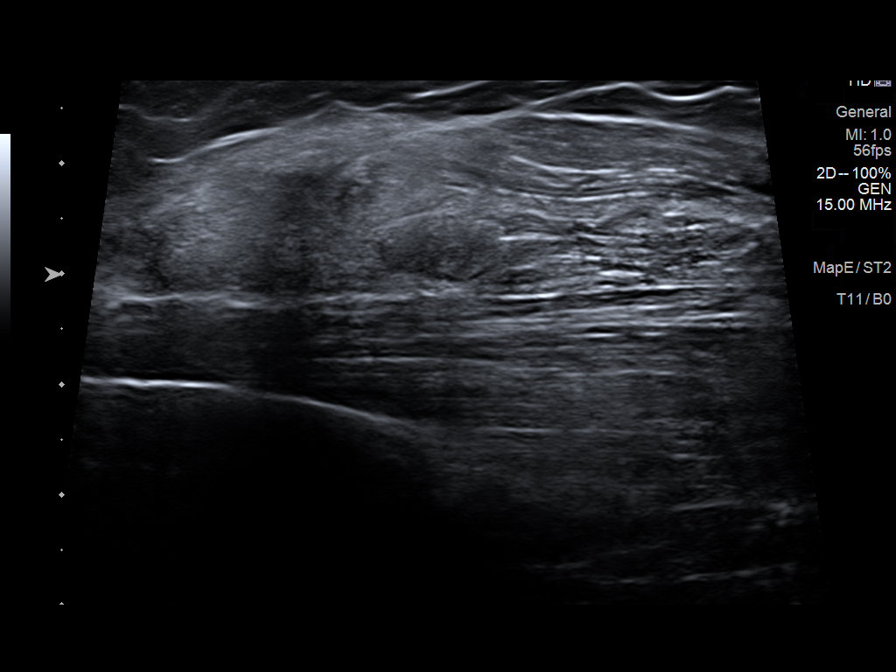
[im 5/9]
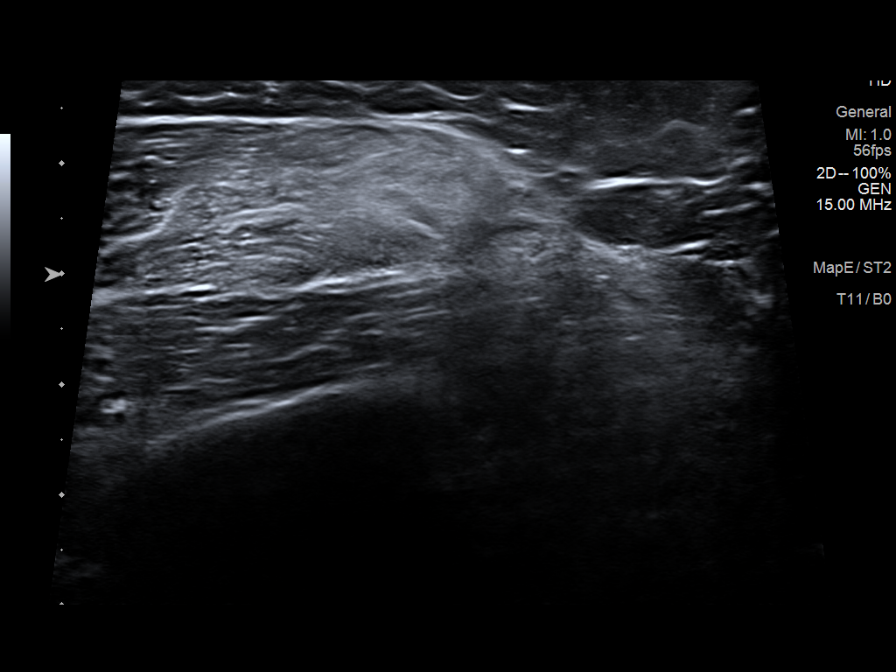
[im 6/9]
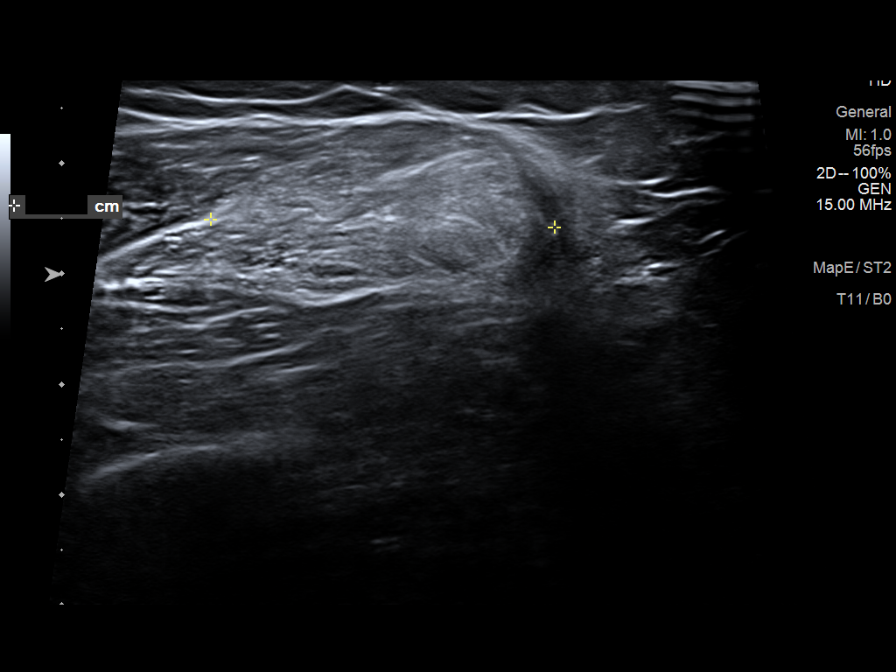
[im 7/9]
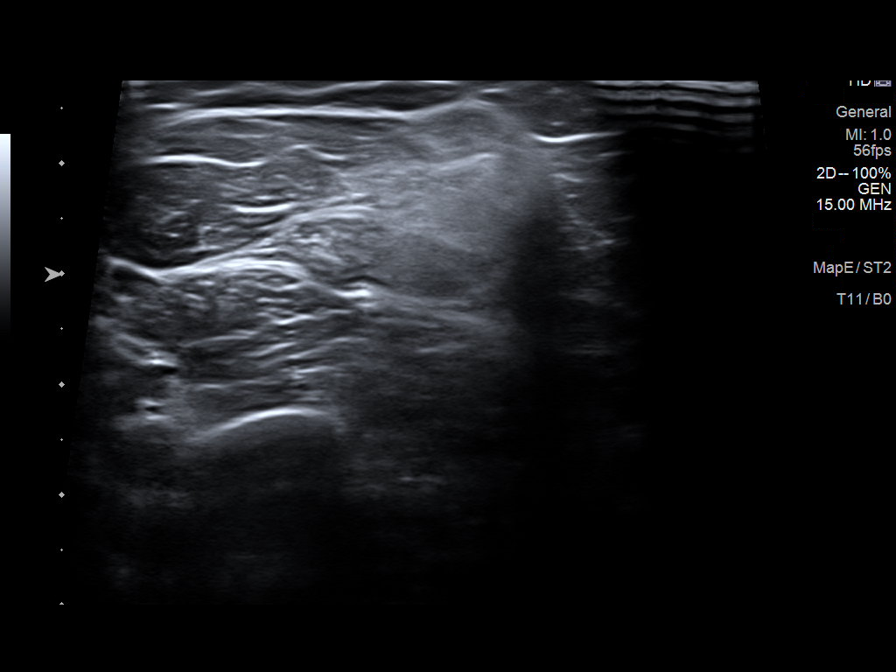
[im 8/9]
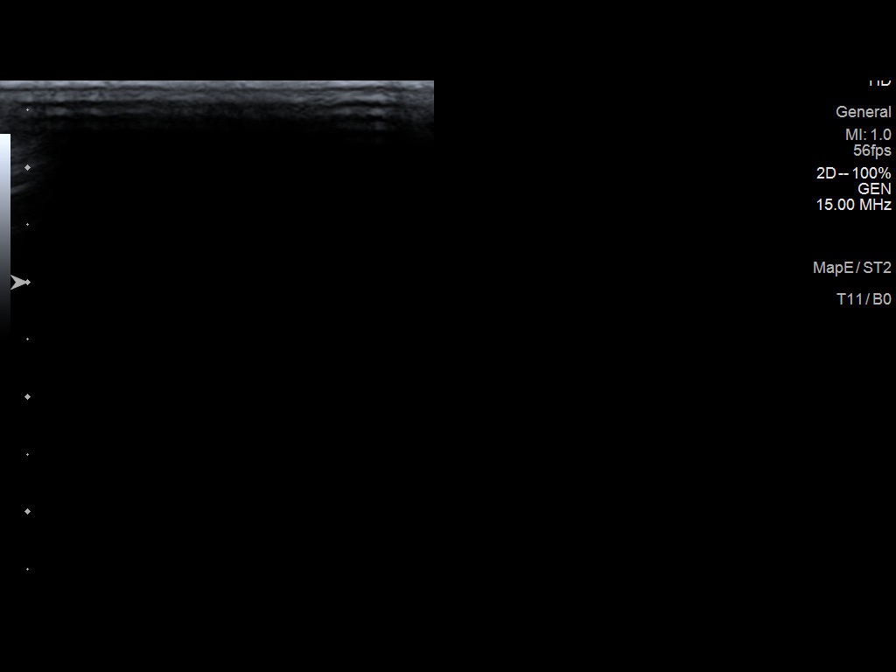
[im 9/9]
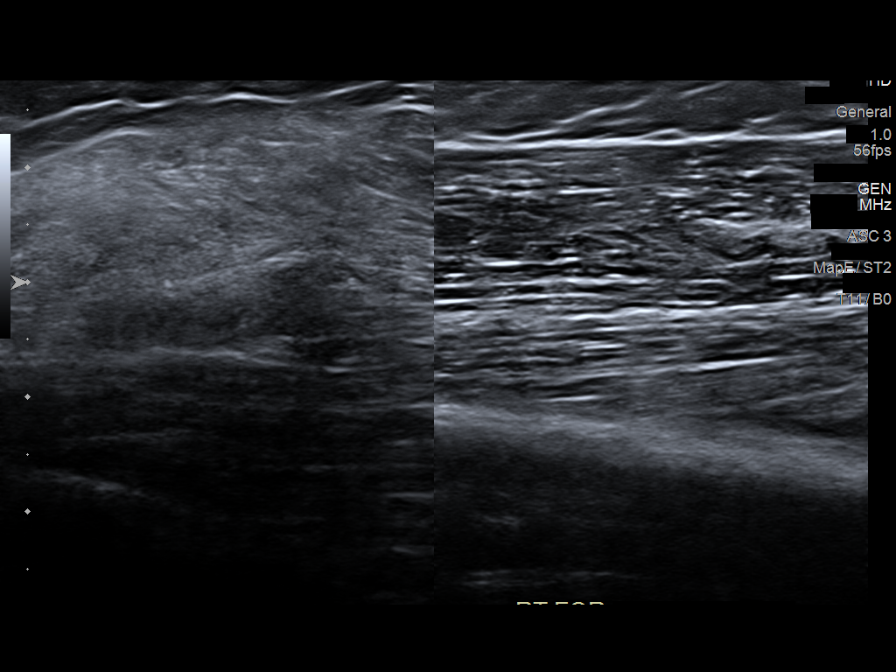

[9 of 9 positions shown; findings below may reference images not displayed]

FINDINGS: Scanning was directed toward the region of concern. An echogenic
lesion in the subcutaneous tissues measuring 5 4 x 1.8 3.1 cm is
identified and consistent with a simple lipoma. No other mass is
seen.
IMPRESSION: Findings most consistent with a simple lipoma in the region of
concern. If the lesion enlarges becomes painful, consider MRI with
and without contrast for further evaluation.

## 2021-05-16 ENCOUNTER — Ambulatory Visit: Payer: BC Managed Care – PPO | Admitting: Family Medicine

## 2021-05-30 ENCOUNTER — Encounter: Payer: BC Managed Care – PPO | Attending: Physical Medicine and Rehabilitation | Admitting: Registered Nurse

## 2021-05-30 ENCOUNTER — Other Ambulatory Visit: Payer: Self-pay

## 2021-05-30 ENCOUNTER — Encounter: Payer: Self-pay | Admitting: Registered Nurse

## 2021-05-30 VITALS — BP 120/79 | HR 104 | Ht 63.0 in | Wt 133.0 lb

## 2021-05-30 DIAGNOSIS — M961 Postlaminectomy syndrome, not elsewhere classified: Secondary | ICD-10-CM | POA: Insufficient documentation

## 2021-05-30 DIAGNOSIS — M5416 Radiculopathy, lumbar region: Secondary | ICD-10-CM | POA: Diagnosis not present

## 2021-05-30 DIAGNOSIS — G620 Drug-induced polyneuropathy: Secondary | ICD-10-CM | POA: Insufficient documentation

## 2021-05-30 DIAGNOSIS — Z5181 Encounter for therapeutic drug level monitoring: Secondary | ICD-10-CM | POA: Insufficient documentation

## 2021-05-30 DIAGNOSIS — Z79891 Long term (current) use of opiate analgesic: Secondary | ICD-10-CM | POA: Insufficient documentation

## 2021-05-30 DIAGNOSIS — G4700 Insomnia, unspecified: Secondary | ICD-10-CM | POA: Insufficient documentation

## 2021-05-30 DIAGNOSIS — G894 Chronic pain syndrome: Secondary | ICD-10-CM | POA: Insufficient documentation

## 2021-05-30 DIAGNOSIS — M5412 Radiculopathy, cervical region: Secondary | ICD-10-CM | POA: Insufficient documentation

## 2021-05-30 DIAGNOSIS — M7918 Myalgia, other site: Secondary | ICD-10-CM | POA: Insufficient documentation

## 2021-05-30 DIAGNOSIS — T451X5A Adverse effect of antineoplastic and immunosuppressive drugs, initial encounter: Secondary | ICD-10-CM | POA: Insufficient documentation

## 2021-05-30 DIAGNOSIS — M542 Cervicalgia: Secondary | ICD-10-CM | POA: Insufficient documentation

## 2021-05-30 MED ORDER — HYDROCODONE-ACETAMINOPHEN 10-325 MG PO TABS: 1.0000 | ORAL_TABLET | Freq: Four times a day (QID) | ORAL | 0 refills | Status: DC | PRN

## 2021-05-30 NOTE — Progress Notes (Signed)
Subjective:    Patient ID: Elizabeth Torres, female    DOB: [birth year], 45 y.o.   MRN: [id number]  HPI: Elizabeth Torres is a 45 y.o. female whose appointment was changed to a My-Chart Video visit, she called office with complaints of vomiting and diarrhea. Elizabeth Torres agrees with My-Chart Video visit and verbalizes understanding.  She  states her pain is located in her neck radiating into her bilateral shoulders and lower back pain radiating into her bilateral lower extremities. She rates her pain 6. Her current exercise regime is walking and performing stretching exercises.  Elizabeth Torres Morphine equivalent is 48.33 MME.   Last UDS was Performed on 01/24/2021, it was consistent.     Pain Inventory Average Pain 6 Pain Right Now 6 My pain is constant, dull, aching, and throbbing  In the last 24 hours, has pain interfered with the following? General activity 2 Relation with others 2 Enjoyment of life 2 What TIME of day is your pain at its worst? morning , daytime, evening, and night Sleep (in general) Poor  Pain is worse with: sitting and inactivity Pain improves with: heat/ice, medication, and injections Relief from Meds: 5  Family History  Problem Relation Age of Onset   Diabetes Mother    Hypertension Mother    Pulmonary fibrosis Mother    Cancer Paternal Uncle    Breast cancer Paternal Uncle        75'S   Breast cancer Paternal Aunt        72'S   Other Father        unknown medical history   Social History   Socioeconomic History   Marital status: Married    Spouse name: Peta Peachey   Number of children: 3   Years of education: Not on file   Highest education level: Not on file  Occupational History   Not on file  Tobacco Use   Smoking status: Former    Packs/day: 1.00    Years: 20.00    Pack years: 20.00    Types: Cigarettes    Quit date: 01/08/2020    Years since quitting: 1.3   Smokeless tobacco: Never  Vaping Use   Vaping Use: Never used  Substance and Sexual  Activity   Alcohol use: Not Currently   Drug use: Never   Sexual activity: Yes    Partners: Male  Other Topics Concern   Not on file  Social History Narrative   Not on file   Social Determinants of Health   Financial Resource Strain: Not on file  Food Insecurity: Not on file  Transportation Needs: Not on file  Physical Activity: Not on file  Stress: Not on file  Social Connections: Not on file   Past Surgical History:  Procedure Laterality Date   BREAST BIOPSY Right 07/2011   invasive ductal carcinoma   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 RAd and chemo tx   BREAST SURGERY     mastectomy Right    partial with lymph node dissection   PORT A CATH REVISION     PORT-A-CATH REMOVAL  11-07-15   Dr Bary Castilla   SHOULDER ARTHROSCOPY WITH ROTATOR CUFF REPAIR Left 05/18/2019   Procedure: SHOULDER ARTHROSCOPY WITH SUBACROMIAL DECOMPRESSION AND DISTAL CLAVICAL EXCISION, INTRAARTICULAR DEBRIDEMENT;  Surgeon: Lovell Sheehan, MD;  Location: [facility];  Service: Orthopedics;  Laterality: Left;   SPINE SURGERY  2008   Dr Joya Salm   TUBAL LIGATION  Past Surgical History:  Procedure Laterality Date   BREAST BIOPSY Right 07/2011   invasive ductal carcinoma   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 RAd and chemo tx   BREAST SURGERY     mastectomy Right    partial with lymph node dissection   PORT A CATH REVISION     PORT-A-CATH REMOVAL  11-07-15   Dr Bary Castilla   SHOULDER ARTHROSCOPY WITH ROTATOR CUFF REPAIR Left 05/18/2019   Procedure: SHOULDER ARTHROSCOPY WITH SUBACROMIAL DECOMPRESSION AND DISTAL CLAVICAL EXCISION, INTRAARTICULAR DEBRIDEMENT;  Surgeon: Lovell Sheehan, MD;  Location: [place];  Service: Orthopedics;  Laterality: Left;   SPINE SURGERY  2008   Dr Joya Salm   TUBAL LIGATION     Past Medical History:  Diagnosis Date   BRCA negative    Breast cancer ([place]) 2013    RT LUMPECTOMY with chemo and rad tx   Degenerative disc disease, lumbar    Personal history of chemotherapy 2013   for breast ca   Personal history of radiation therapy 2013   F/U right breast cancer    Radiation 2013   BREAST CA   Status post chemotherapy 2013   BREAST CA   There were no vitals taken for this visit.  Opioid Risk Score:   Fall Risk Score:  `1  Depression screen PHQ 2/9  Depression screen [facility] 2/9 05/30/2021 04/30/2021 04/21/2021 04/01/2021 03/10/2021 02/25/2021 02/19/2021  Decreased Interest 0 0 1 0 0 0 0  Down, Depressed, Hopeless 0 0 0 0 0 0 0  PHQ - 2 Score 0 0 1 0 0 0 0  Altered sleeping - - 1 - 0 1 -  Tired, decreased energy - - 0 - 0 1 -  Change in appetite - - 0 - 0 0 -  Feeling bad or failure about yourself  - - 0 - 0 0 -  Trouble concentrating - - 0 - 0 0 -  Moving slowly or fidgety/restless - - 0 - 0 0 -  Suicidal thoughts - - 0 - 0 0 -  PHQ-9 Score - - 2 - 0 2 -  Difficult doing work/chores - - Not difficult at all - Not difficult at all Not difficult at all -  Some recent data might be hidden     Review of Systems  Constitutional: Negative.   HENT: Negative.    Eyes: Negative.   Respiratory: Negative.    Cardiovascular: Negative.   Gastrointestinal: Negative.   Endocrine: Negative.   Genitourinary: Negative.   Musculoskeletal:  Positive for back pain.  Skin: Negative.   Allergic/Immunologic: Negative.   Neurological:  Positive for dizziness, weakness, light-headedness, numbness and headaches.  Hematological: Negative.   Psychiatric/Behavioral:  Positive for sleep disturbance.       Objective:   Physical Exam Vitals and nursing note reviewed.  Musculoskeletal:     Comments: No Physical Exam Performed My-Chart Video Visit         Assessment & Plan:  1. Lumbar postlaminectomy syndrome status post L5-S1 fusion with chronic S1 radiculopathy. Continue current medication regimen with Nortriptyline. 05/30/2021. Refilled :  Hydrocodone 10/325  mg #145-one every 6 hours as needed for pain, may take an extra tablet on work days only. 05/30/2021. We will continue the opioid monitoring program, this consists of regular clinic visits, examinations, urine drug screen, pill counts as well as use of [place] Controlled Substance Reporting system. A 12 month History has been reviewed on  the [place] Controlled Substance Reporting System on 05/30/2021. 2. 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 05/30/2021 3. Insomnia: Continue current medication regimen Ambien. 05/30/2021  4.Chronic Left Shoulder Pain: No complaints today: S/P Left Shoulder Arthroscopy with Rotator Cuff Repair on 05/18/2019 by Dr. Gunnar Fusi : Ortho Following. 05/30/2021 5. Cervicalgia/ Cervical Radiculitis: Ms. Keinath reports she had her  MRI: Continue current medication regimen. Continue to monitor.   Ortho Following. 05/30/2021     F/U in 1 month.   My-Chart Video Visit Established Patient Location of Patient: At her Home Location of Provider in the Office

## 2021-06-02 ENCOUNTER — Other Ambulatory Visit: Payer: Self-pay | Admitting: Family Medicine

## 2021-06-02 DIAGNOSIS — M4722 Other spondylosis with radiculopathy, cervical region: Secondary | ICD-10-CM

## 2021-06-02 NOTE — Telephone Encounter (Signed)
Requested Prescriptions  Pending Prescriptions Disp Refills  . diclofenac (VOLTAREN) 75 MG EC tablet [Pharmacy Med Name: DICLOFENAC SODIUM 75MG  DR TABLETS] 60 tablet 2    Sig: TAKE 1 TABLET(75 MG) BY MOUTH TWICE DAILY AS NEEDED     Analgesics:  NSAIDS Failed - 06/02/2021  6:21 AM      Failed - HGB in normal range and within 360 days    Hemoglobin  Date Value Ref Range Status  01/26/2020 14.7 11.1 - 15.9 g/dL Final   HGB  Date Value Ref Range Status  08/10/2011 13.6 11.6 - 15.9 g/dL Final         Passed - Cr in normal range and within 360 days    Creatinine  Date Value Ref Range Status  02/22/2012 0.83 0.60 - 1.30 mg/dL Final   Creatinine, Ser  Date Value Ref Range Status  10/22/2020 0.70 0.57 - 1.00 mg/dL Final   Creatinine, Urine  Date Value Ref Range Status  04/17/2015 12.15 (L) >20.0 mg/dL Final         Passed - Patient is not pregnant      Passed - Valid encounter within last 12 months    Recent Outpatient Visits          1 month ago Rotator cuff impingement syndrome, left   [facility] Montel Culver, MD   2 months ago Rotator cuff impingement syndrome, left   [facility] Montel Culver, MD   3 months ago Cervical spondylosis with radiculopathy   [facility] Montel Culver, MD   4 months ago Mass of soft tissue of upper arm   [facility] Montel Culver, MD   7 months ago Essential hypertension   [place], [place]      Future Appointments            In 4 months Juline Patch, MD [facility], [facility]

## 2021-06-03 ENCOUNTER — Encounter
Payer: BC Managed Care – PPO | Attending: Physical Medicine and Rehabilitation | Admitting: Physical Medicine & Rehabilitation

## 2021-06-03 ENCOUNTER — Other Ambulatory Visit: Payer: Self-pay

## 2021-06-03 ENCOUNTER — Encounter: Payer: Self-pay | Admitting: Physical Medicine & Rehabilitation

## 2021-06-03 VITALS — BP 124/85 | HR 79 | Temp 98.3°F | Ht 63.0 in | Wt 133.0 lb

## 2021-06-03 DIAGNOSIS — Z5181 Encounter for therapeutic drug level monitoring: Secondary | ICD-10-CM | POA: Diagnosis not present

## 2021-06-03 DIAGNOSIS — Z79891 Long term (current) use of opiate analgesic: Secondary | ICD-10-CM | POA: Insufficient documentation

## 2021-06-03 DIAGNOSIS — M542 Cervicalgia: Secondary | ICD-10-CM | POA: Insufficient documentation

## 2021-06-03 DIAGNOSIS — G620 Drug-induced polyneuropathy: Secondary | ICD-10-CM | POA: Insufficient documentation

## 2021-06-03 DIAGNOSIS — M5412 Radiculopathy, cervical region: Secondary | ICD-10-CM | POA: Diagnosis not present

## 2021-06-03 DIAGNOSIS — M7918 Myalgia, other site: Secondary | ICD-10-CM | POA: Diagnosis not present

## 2021-06-03 DIAGNOSIS — M961 Postlaminectomy syndrome, not elsewhere classified: Secondary | ICD-10-CM | POA: Insufficient documentation

## 2021-06-03 DIAGNOSIS — T451X5A Adverse effect of antineoplastic and immunosuppressive drugs, initial encounter: Secondary | ICD-10-CM | POA: Diagnosis not present

## 2021-06-03 DIAGNOSIS — G894 Chronic pain syndrome: Secondary | ICD-10-CM | POA: Insufficient documentation

## 2021-06-03 DIAGNOSIS — G4700 Insomnia, unspecified: Secondary | ICD-10-CM | POA: Diagnosis not present

## 2021-06-03 DIAGNOSIS — M62830 Muscle spasm of back: Secondary | ICD-10-CM | POA: Diagnosis not present

## 2021-06-03 DIAGNOSIS — M5416 Radiculopathy, lumbar region: Secondary | ICD-10-CM | POA: Insufficient documentation

## 2021-06-03 NOTE — Progress Notes (Signed)
Subjective:    Patient ID: Elizabeth Torres, female    DOB: [birth year], 45 y.o.   MRN: [id number]  HPI 45 year old female with history of lumbar postlaminectomy syndrome, chronic pain on chronic opioids as well as history of breast carcinoma status postmastectomy who is here today for evaluation of neck pain as well as hand numbness.  The symptoms have started early this summer although she did have some similar symptoms with primarily right-sided hand and arm pain in 2017.  In addition there was a cervical myelogram for neck pain as well as right upper extremity weakness in 2011 that showed only some mild degenerative changes at C5 6 months mild stenosis.  She has a history of left shoulder rotator cuff tear about 2 years ago chronic left arm weakness.  Recent evaluation revealed tenotomy left long head biceps MRI of that area 02/20/2021 showed rotator cuff to be intact but left bicep atrophy. She underwent MRI of the cervical spine on 04/24/2021 with results as below, films were reviewed with patient No difficulty with walking.  No spasms.  No bowel or bladder dysfunction, no falls The patient also has a history of chemotherapy-induced polyneuropathy with chronic pain, gabapentin did not work, nortriptyline has been effective 30 mg at night.  Started by sports medicine on duloxetine.  Patient notes no improvement on either the 30 mg dose or the 60 mg dose.  Hand numbness involves all fingers occurring mainly in the morning but sometimes with driving.  Also sometimes get when she is just sitting  CLINICAL DATA:  Neck pain for 6 months with bilateral arm pain and hand numbness   EXAM: MRI CERVICAL SPINE WITHOUT CONTRAST   TECHNIQUE: Multiplanar, multisequence MR imaging of the cervical spine was performed. No intravenous contrast was administered.   COMPARISON:  Cervical myelogram 05/09/2010   FINDINGS: Alignment: Reversal of cervical lordosis   Vertebrae: No fracture, evidence of discitis,  or bone lesion.   Cord: Normal signal and morphology.   Posterior Fossa, vertebral arteries, paraspinal tissues: Negative.   Disc levels:   C2-3: Unremarkable.   C3-4: Unremarkable.   C4-5: Disc narrowing and right eccentric bulging with right uncovertebral ridging. Moderate right foraminal impingement   C5-6: Disc narrowing and bulging with broad protrusion. Bilateral uncovertebral spurring asymmetric to the right. Spinal stenosis that causes slight ventral cord indentation. Right more than left foraminal impingement   C6-7: Mild right uncovertebral ridging. Disc narrowing and bulging with shallow right paracentral protrusion contacting the ventral cord. Patent foramina   C7-T1:Unremarkable.   IMPRESSION: 1. Disc degeneration with reversal of cervical lordosis that has progressed from a 2011 myelogram. 2. Disc protrusions contact the ventral cord at C5-6 and C6-7. Diffusely patent spinal canal. 3. Right foraminal impingement at C4-5 and especially C5-6. 4. Moderate left foraminal impingement at C5-6.     Electronically Signed   By: Jorje Guild M.D.   On: 04/30/2021 07:49 Pain Inventory Average Pain 7 Pain Right Now 7 My pain is constant and sharp  In the last 24 hours, has pain interfered with the following? General activity 7 Relation with others 7 Enjoyment of life 7 What TIME of day is your pain at its worst? evening Sleep (in general) Poor  Pain is worse with:  turning the neck Pain improves with: rest, heat/ice, and medication Relief from Meds: 3  Family History  Problem Relation Age of Onset   Diabetes Mother    Hypertension Mother    Pulmonary fibrosis Mother  Cancer Paternal Uncle    Breast cancer Paternal Uncle        31'S   Breast cancer Paternal Aunt        68'S   Other Father        unknown medical history   Social History   Socioeconomic History   Marital status: Married    Spouse name: Tammey Deeg   Number of children: 3    Years of education: Not on file   Highest education level: Not on file  Occupational History   Not on file  Tobacco Use   Smoking status: Former    Packs/day: 1.00    Years: 20.00    Pack years: 20.00    Types: Cigarettes    Quit date: 01/08/2020    Years since quitting: 1.4   Smokeless tobacco: Never  Vaping Use   Vaping Use: Never used  Substance and Sexual Activity   Alcohol use: Not Currently   Drug use: Never   Sexual activity: Yes    Partners: Male  Other Topics Concern   Not on file  Social History Narrative   Not on file   Social Determinants of Health   Financial Resource Strain: Not on file  Food Insecurity: Not on file  Transportation Needs: Not on file  Physical Activity: Not on file  Stress: Not on file  Social Connections: Not on file   Past Surgical History:  Procedure Laterality Date   BREAST BIOPSY Right 07/2011   invasive ductal carcinoma   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 RAd and chemo tx   BREAST SURGERY     mastectomy Right    partial with lymph node dissection   PORT A CATH REVISION     PORT-A-CATH REMOVAL  11-07-15   Dr Bary Castilla   SHOULDER ARTHROSCOPY WITH ROTATOR CUFF REPAIR Left 05/18/2019   Procedure: SHOULDER ARTHROSCOPY WITH SUBACROMIAL DECOMPRESSION AND DISTAL CLAVICAL EXCISION, INTRAARTICULAR DEBRIDEMENT;  Surgeon: Lovell Sheehan, MD;  Location: [place];  Service: Orthopedics;  Laterality: Left;   SPINE SURGERY  2008   Dr Joya Salm   TUBAL LIGATION     Past Surgical History:  Procedure Laterality Date   BREAST BIOPSY Right 07/2011   invasive ductal carcinoma   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 RAd and chemo tx   BREAST SURGERY     mastectomy Right    partial with lymph node dissection   PORT A CATH REVISION     PORT-A-CATH REMOVAL  11-07-15   Dr Bary Castilla   SHOULDER ARTHROSCOPY WITH ROTATOR CUFF REPAIR Left 05/18/2019    Procedure: SHOULDER ARTHROSCOPY WITH SUBACROMIAL DECOMPRESSION AND DISTAL CLAVICAL EXCISION, INTRAARTICULAR DEBRIDEMENT;  Surgeon: Lovell Sheehan, MD;  Location: [facility];  Service: Orthopedics;  Laterality: Left;   SPINE SURGERY  2008   Dr Joya Salm   TUBAL LIGATION     Past Medical History:  Diagnosis Date   BRCA negative    Breast cancer ([place]) 2013   RT LUMPECTOMY with chemo and rad tx   Degenerative disc disease, lumbar    Personal history of chemotherapy 2013   for breast ca   Personal history of radiation therapy 2013   F/U right breast cancer    Radiation 2013   BREAST CA   Status post chemotherapy 2013   BREAST CA   BP 124/85   Pulse 79   Temp 98.3 F (36.8 C)  Ht _0  (1.6 m)   Wt 133 lb (60.3 kg)   SpO2 96%   BMI 23.56 kg/m   Opioid Risk Score:   Fall Risk Score:  `1  Depression screen PHQ 2/9  Depression screen [facility] 2/9 06/03/2021 05/30/2021 04/30/2021 04/21/2021 04/01/2021 03/10/2021 02/25/2021  Decreased Interest 0 0 0 1 0 0 0  Down, Depressed, Hopeless 0 0 0 0 0 0 0  PHQ - 2 Score 0 0 0 1 0 0 0  Altered sleeping - - - 1 - 0 1  Tired, decreased energy - - - 0 - 0 1  Change in appetite - - - 0 - 0 0  Feeling bad or failure about yourself  - - - 0 - 0 0  Trouble concentrating - - - 0 - 0 0  Moving slowly or fidgety/restless - - - 0 - 0 0  Suicidal thoughts - - - 0 - 0 0  PHQ-9 Score - - - 2 - 0 2  Difficult doing work/chores - - - Not difficult at all - Not difficult at all Not difficult at all  Some recent data might be hidden    Review of Systems  Musculoskeletal:  Positive for back pain and neck pain.       Pain in left shoulder, left arm , legs  All other systems reviewed and are negative.     Objective:   Physical Exam Vitals and nursing note reviewed.  Constitutional:      Appearance: She is normal weight.  HENT:     Head: Normocephalic and atraumatic.  Eyes:     Extraocular Movements: Extraocular movements intact.      Conjunctiva/sclera: Conjunctivae normal.     Pupils: Pupils are equal, round, and reactive to light.  Musculoskeletal:     Right lower leg: No edema.     Left lower leg: No edema.     Comments: Tenderness palpation in the area on the left as well as anterior shoulder.  There is tenderness palpation left side of the neck as well. Cervical spine range of motion flexion extension lateral bending there is shoulder pain with right lateral  head tilt on the right side.  There is shoulder pain on the left side as well as left lateral head tilt    Skin:    General: Skin is warm and dry.  Neurological:     Mental Status: She is alert and oriented to person, place, and time.  Psychiatric:        Mood and Affect: Mood normal.        Behavior: Behavior normal.   Motor strength is 4/5 in the left deltoid bicep and tricep 5 at the finger flexors, 5/5 in the right deltoid bicep tricep 5/5 bilateral hip flexors knee extensors ankle dorsiflexor Sensation reduced at the right third and fourth digits. Negative Tinel's  No evidence of intrinsic atrophy in the hands.      Assessment & Plan:  1.  Lumbar postlaminectomy syndrome pain well controlled on hydrocodone 10/325 QID with occ extra tab , 145 tabs per month   2.  Cervical radiculitis chronic intermittent on the right , now having left LUE symptoms.  Has median nerve sensory loss on right  as well as LUE pain complicated by hx biceps tenotomy  MRI showing mainly foraminal stenosis C5-6 bilaterally which would explain UE pain but not C7 numbness Will check EMG to eval for CTS given pain in early am and driving, wear  bilateral wrist splints at noc   No additional benefit of duloxetine, has potential interaction with nortriptyline, advise reduce duloxetine to 47m po qd x 7d then d/c

## 2021-06-03 NOTE — Patient Instructions (Signed)
Buy velcro wrist splints to be worn at night and as needed during the day

## 2021-06-24 ENCOUNTER — Other Ambulatory Visit: Payer: Self-pay

## 2021-06-24 ENCOUNTER — Encounter (HOSPITAL_BASED_OUTPATIENT_CLINIC_OR_DEPARTMENT_OTHER): Payer: BC Managed Care – PPO | Admitting: Registered Nurse

## 2021-06-24 VITALS — BP 122/82 | HR 91 | Ht 63.0 in | Wt 132.2 lb

## 2021-06-24 DIAGNOSIS — M7918 Myalgia, other site: Secondary | ICD-10-CM

## 2021-06-24 DIAGNOSIS — Z5181 Encounter for therapeutic drug level monitoring: Secondary | ICD-10-CM

## 2021-06-24 DIAGNOSIS — G620 Drug-induced polyneuropathy: Secondary | ICD-10-CM | POA: Diagnosis not present

## 2021-06-24 DIAGNOSIS — M5416 Radiculopathy, lumbar region: Secondary | ICD-10-CM

## 2021-06-24 DIAGNOSIS — Z79891 Long term (current) use of opiate analgesic: Secondary | ICD-10-CM | POA: Diagnosis not present

## 2021-06-24 DIAGNOSIS — M542 Cervicalgia: Secondary | ICD-10-CM | POA: Diagnosis not present

## 2021-06-24 DIAGNOSIS — M961 Postlaminectomy syndrome, not elsewhere classified: Secondary | ICD-10-CM | POA: Diagnosis not present

## 2021-06-24 DIAGNOSIS — G4700 Insomnia, unspecified: Secondary | ICD-10-CM

## 2021-06-24 DIAGNOSIS — M5412 Radiculopathy, cervical region: Secondary | ICD-10-CM | POA: Diagnosis not present

## 2021-06-24 DIAGNOSIS — M62830 Muscle spasm of back: Secondary | ICD-10-CM | POA: Diagnosis not present

## 2021-06-24 DIAGNOSIS — G894 Chronic pain syndrome: Secondary | ICD-10-CM

## 2021-06-24 DIAGNOSIS — T451X5A Adverse effect of antineoplastic and immunosuppressive drugs, initial encounter: Secondary | ICD-10-CM | POA: Diagnosis not present

## 2021-06-24 MED ORDER — PREGABALIN 25 MG PO CAPS: 25.0000 mg | ORAL_CAPSULE | Freq: Two times a day (BID) | ORAL | 0 refills | Status: DC

## 2021-06-24 MED ORDER — HYDROCODONE-ACETAMINOPHEN 10-325 MG PO TABS: 1.0000 | ORAL_TABLET | Freq: Every day | ORAL | 0 refills | Status: DC | PRN

## 2021-06-24 NOTE — Patient Instructions (Addendum)
Send a My- Chart Message in one  week regarding Lyrica to Dr Letta Pate since I will be on vacation.      Also send a My-Chart Message on 07/21/2021, regarding your Hydrocodone pill count.

## 2021-06-24 NOTE — Progress Notes (Signed)
Subjective:    Patient ID: Elizabeth Torres, female    DOB: April 24, 1976, 45 y.o.   MRN: [id number]  HPI: Elizabeth Torres is a 45 y.o. female who returns for follow up appointment for chronic pain and medication refill. She states her pain is located in her neck radiating into her bilateral shoulders and upper back with tingling and burning. Elizabeth Torres states" this is the worse pain she has felt with increase intensity of pain, she is very tearful" and emotional support given. Dr Letta Pate place a referral to Dr Jabier Mutton on 06/03/2021, this provider placed a call and now Elizabeth Torres has a scheduled  appointment scheduled with Dr Jabier Mutton. With Elizabeth Torres increase neuropathic pain we discussed Lyrica, the above was discussed with Dr Letta Pate and he agrees with plan.  Elizabeth Torres was prescribed Lyrica and she will send a My-Chart message with a update on medication in a week, she verbalizes understanding.   She  rates her pain 9. Her current exercise regime is walking.  Elizabeth Torres Morphine equivalent is 48.33 MME.   Oral Swab was Performed today.    Pain Inventory Average Pain 9 Pain Right Now 9 My pain is constant, sharp, stabbing, tingling, and aching  In the last 24 hours, has pain interfered with the following? General activity 8 Relation with others 9 Enjoyment of life 9 What TIME of day is your pain at its worst? morning , daytime, evening, and night Sleep (in general) Poor  Pain is worse with: walking, bending, sitting, standing, some activites, and turning neck Pain improves with: rest, heat/ice, and medication Relief from Meds: 3  Family History  Problem Relation Age of Onset   Diabetes Mother    Hypertension Mother    Pulmonary fibrosis Mother    Cancer Paternal Uncle    Breast cancer Paternal Uncle        69'S   Breast cancer Paternal Aunt        63'S   Other Father        unknown medical history   Social History   Socioeconomic History   Marital status: Married    Spouse name: Lawsyn Heiler    Number of children: 3   Years of education: Not on file   Highest education level: Not on file  Occupational History   Not on file  Tobacco Use   Smoking status: Former    Packs/day: 1.00    Years: 20.00    Pack years: 20.00    Types: Cigarettes    Quit date: 01/08/2020    Years since quitting: 1.4   Smokeless tobacco: Never  Vaping Use   Vaping Use: Never used  Substance and Sexual Activity   Alcohol use: Not Currently   Drug use: Never   Sexual activity: Yes    Partners: Male  Other Topics Concern   Not on file  Social History Narrative   Not on file   Social Determinants of Health   Financial Resource Strain: Not on file  Food Insecurity: Not on file  Transportation Needs: Not on file  Physical Activity: Not on file  Stress: Not on file  Social Connections: Not on file   Past Surgical History:  Procedure Laterality Date   BREAST BIOPSY Right 07/2011   invasive ductal carcinoma   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 RAd and chemo tx   BREAST SURGERY     mastectomy Right    partial  with lymph node dissection   PORT A CATH REVISION     PORT-A-CATH REMOVAL  11-07-15   Dr Bary Castilla   SHOULDER ARTHROSCOPY WITH ROTATOR CUFF REPAIR Left 05/18/2019   Procedure: SHOULDER ARTHROSCOPY WITH SUBACROMIAL DECOMPRESSION AND DISTAL CLAVICAL EXCISION, INTRAARTICULAR DEBRIDEMENT;  Surgeon: Lovell Sheehan, MD;  Location: [facility];  Service: Orthopedics;  Laterality: Left;   SPINE SURGERY  2008   Dr Joya Salm   TUBAL LIGATION     Past Surgical History:  Procedure Laterality Date   BREAST BIOPSY Right 07/2011   invasive ductal carcinoma   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 RAd and chemo tx   BREAST SURGERY     mastectomy Right    partial with lymph node dissection   PORT A CATH REVISION     PORT-A-CATH REMOVAL  11-07-15   Dr Bary Castilla   SHOULDER ARTHROSCOPY WITH ROTATOR  CUFF REPAIR Left 05/18/2019   Procedure: SHOULDER ARTHROSCOPY WITH SUBACROMIAL DECOMPRESSION AND DISTAL CLAVICAL EXCISION, INTRAARTICULAR DEBRIDEMENT;  Surgeon: Lovell Sheehan, MD;  Location: [facility];  Service: Orthopedics;  Laterality: Left;   SPINE SURGERY  2008   Dr Joya Salm   TUBAL LIGATION     Past Medical History:  Diagnosis Date   BRCA negative    Breast cancer ([place]) 2013   RT LUMPECTOMY with chemo and rad tx   Degenerative disc disease, lumbar    Personal history of chemotherapy 2013   for breast ca   Personal history of radiation therapy 2013   F/U right breast cancer    Radiation 2013   BREAST CA   Status post chemotherapy 2013   BREAST CA   BP 122/82   Pulse 91   Ht _0  (1.6 m)   Wt 132 lb 3.2 oz (60 kg)   SpO2 97%   BMI 23.42 kg/m   Opioid Risk Score:   Fall Risk Score:  `1  Depression screen PHQ 2/9  Depression screen [facility] 2/9 06/03/2021 05/30/2021 04/30/2021 04/21/2021 04/01/2021 03/10/2021 02/25/2021  Decreased Interest 0 0 0 1 0 0 0  Down, Depressed, Hopeless 0 0 0 0 0 0 0  PHQ - 2 Score 0 0 0 1 0 0 0  Altered sleeping - - - 1 - 0 1  Tired, decreased energy - - - 0 - 0 1  Change in appetite - - - 0 - 0 0  Feeling bad or failure about yourself  - - - 0 - 0 0  Trouble concentrating - - - 0 - 0 0  Moving slowly or fidgety/restless - - - 0 - 0 0  Suicidal thoughts - - - 0 - 0 0  PHQ-9 Score - - - 2 - 0 2  Difficult doing work/chores - - - Not difficult at all - Not difficult at all Not difficult at all  Some recent data might be hidden      Review of Systems  Musculoskeletal:  Positive for back pain and neck pain.       Pain in left shoulder, arm and legs pain  All other systems reviewed and are negative.     Objective:   Physical Exam Vitals and nursing note reviewed.  Constitutional:      Appearance: Normal appearance.  Neck:     Comments: Cervical Paraspinal Hypersensitivity Cardiovascular:     Rate and Rhythm: Normal rate and  regular rhythm.     Pulses: Normal pulses.  Heart sounds: Normal heart sounds.  Pulmonary:     Effort: Pulmonary effort is normal.     Breath sounds: Normal breath sounds.  Musculoskeletal:     Cervical back: Normal range of motion and neck supple.     Comments: Normal Muscle Bulk and Muscle Testing Reveals:  Upper Extremities: Decreased ROM 30 Degrees and Muscle Strength 5/5 Thoracic Hypersensitivity: T-1-T-3 Lower Extremities: Full ROM and Muscle Strength 5/5 Arises from Chair Slowly Antalgic  Gait     Skin:    General: Skin is warm and dry.  Neurological:     Mental Status: She is alert and oriented to person, place, and time.  Psychiatric:        Mood and Affect: Mood normal.        Behavior: Behavior normal.         Assessment & Plan:  1. Lumbar postlaminectomy syndrome status post L5-S1 fusion with chronic S1 radiculopathy. Continue current medication regimen with Nortriptyline. 06/24/2021. Refilled :  Hydrocodone 10/325 mg #145-one every 6 hours as needed for pain, may take an extra tablet on work days only. 06/24/2021. We will continue the opioid monitoring program, this consists of regular clinic visits, examinations, urine drug screen, pill counts as well as use of [place] Controlled Substance Reporting system. A 12 month History has been reviewed on the [place] on 06/24/2021. 2. 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 06/24/2021 3. Insomnia: Continue current medication regimen Ambien. 06/24/2021  4.Chronic Left Shoulder Pain: No complaints today: S/P Left Shoulder Arthroscopy with Rotator Cuff Repair on 05/18/2019 by Dr. Gunnar Fusi : Ortho Following. 06/24/2021 5. Cervicalgia/ Cervical Radiculitis: Lyrica prescribed today, she will send a MY Chart message in a week with a update, she verbalizes understanding. She has a scheduled  appointment with Dr Jabier Mutton in December.    Ortho Following. 05/30/2021     F/U in 1 month.

## 2021-06-25 ENCOUNTER — Encounter: Payer: Self-pay | Admitting: Registered Nurse

## 2021-07-03 LAB — DRUG TOX ALC METAB W/CON, ORAL FLD

## 2021-07-03 LAB — DRUG TOX MONITOR 1 W/CONF, ORAL FLD
Amphetamines: NEGATIVE ng/mL (ref <10)
Barbiturates: NEGATIVE ng/mL (ref <10)
Benzodiazepines: NEGATIVE ng/mL (ref <0.50)
Buprenorphine: NEGATIVE ng/mL (ref <0.10)
Cocaine: NEGATIVE ng/mL (ref <5.0)
Codeine: NEGATIVE ng/mL (ref <2.5)
Cotinine: >250 ng/mL — ABNORMAL HIGH (ref <5.0)
Dihydrocodeine: 13.7 ng/mL — ABNORMAL HIGH (ref <2.5)
Fentanyl: NEGATIVE ng/mL (ref <0.10)
Heroin Metabolite: NEGATIVE ng/mL (ref <1.0)
Hydrocodone: >250 ng/mL — ABNORMAL HIGH (ref <2.5)
Hydromorphone: NEGATIVE ng/mL (ref <2.5)
MARIJUANA: NEGATIVE ng/mL (ref <2.5)
MDMA: NEGATIVE ng/mL (ref <10)
Meprobamate: NEGATIVE ng/mL (ref <2.5)
Methadone: NEGATIVE ng/mL (ref <5.0)
Morphine: NEGATIVE ng/mL (ref <2.5)
Nicotine Metabolite: POSITIVE ng/mL — AB (ref <5.0)
Norhydrocodone: 38.3 ng/mL — ABNORMAL HIGH (ref <2.5)
Noroxycodone: NEGATIVE ng/mL (ref <2.5)
Opiates: POSITIVE ng/mL — AB (ref <2.5)
Oxycodone: NEGATIVE ng/mL (ref <2.5)
Oxymorphone: NEGATIVE ng/mL (ref <2.5)
Phencyclidine: NEGATIVE ng/mL (ref <10)
Tapentadol: NEGATIVE ng/mL (ref <5.0)
Tramadol: NEGATIVE ng/mL (ref <5.0)
Zolpidem: NEGATIVE ng/mL (ref <5.0)

## 2021-07-04 ENCOUNTER — Encounter: Payer: Self-pay | Admitting: Physical Medicine & Rehabilitation

## 2021-07-08 ENCOUNTER — Encounter: Payer: Self-pay | Admitting: Physical Medicine & Rehabilitation

## 2021-07-10 ENCOUNTER — Other Ambulatory Visit: Payer: Self-pay | Admitting: Registered Nurse

## 2021-07-10 ENCOUNTER — Telehealth: Payer: Self-pay | Admitting: *Deleted

## 2021-07-10 NOTE — Telephone Encounter (Signed)
Oral swab drug screen was consistent for prescribed medications.  ?

## 2021-07-16 ENCOUNTER — Encounter: Payer: Self-pay | Admitting: Family Medicine

## 2021-07-18 ENCOUNTER — Other Ambulatory Visit: Payer: Self-pay | Admitting: Family Medicine

## 2021-07-18 DIAGNOSIS — I10 Essential (primary) hypertension: Secondary | ICD-10-CM

## 2021-07-22 ENCOUNTER — Encounter: Payer: Self-pay | Admitting: Family Medicine

## 2021-07-22 ENCOUNTER — Ambulatory Visit (INDEPENDENT_AMBULATORY_CARE_PROVIDER_SITE_OTHER): Payer: BC Managed Care – PPO | Admitting: Family Medicine

## 2021-07-22 ENCOUNTER — Other Ambulatory Visit: Payer: Self-pay

## 2021-07-22 VITALS — BP 120/80 | HR 80 | Ht 63.0 in | Wt 134.0 lb

## 2021-07-22 DIAGNOSIS — I10 Essential (primary) hypertension: Secondary | ICD-10-CM

## 2021-07-22 DIAGNOSIS — R233 Spontaneous ecchymoses: Secondary | ICD-10-CM | POA: Diagnosis not present

## 2021-07-22 DIAGNOSIS — L659 Nonscarring hair loss, unspecified: Secondary | ICD-10-CM

## 2021-07-22 MED ORDER — HYDROCHLOROTHIAZIDE 12.5 MG PO TABS: ORAL_TABLET | ORAL | 1 refills | Status: DC

## 2021-07-22 NOTE — Progress Notes (Signed)
Date:  07/22/2021   Name:  Elizabeth Torres   DOB:  07-27-1976   MRN:  [id number]   Chief Complaint: Hypertension  Hypertension This is a chronic problem. The current episode started more than 1 year ago. The problem has been gradually improving since onset. The problem is controlled. Pertinent negatives include no anxiety, blurred vision, chest pain, headaches, malaise/fatigue, neck pain, orthopnea, palpitations, peripheral edema, PND, shortness of breath or sweats. There are no associated agents to hypertension. There are no known risk factors for coronary artery disease. Past treatments include diuretics. The current treatment provides moderate improvement. There are no compliance problems.  There is no history of angina, kidney disease, CAD/MI, CVA, heart failure, left ventricular hypertrophy, PVD or retinopathy. There is no history of chronic renal disease, a hypertension causing med or renovascular disease.   Lab Results  Component Value Date   NA 140 10/22/2020   K 4.6 10/22/2020   CO2 23 10/22/2020   GLUCOSE 85 10/22/2020   BUN 9 10/22/2020   CREATININE 0.70 10/22/2020   CALCIUM 9.2 10/22/2020   EGFR 110 10/22/2020   GFRNONAA 107 01/26/2020   Lab Results  Component Value Date   CHOL 159 10/22/2020   HDL 58 10/22/2020   LDLCALC 86 10/22/2020   TRIG 77 10/22/2020   No results found for: TSH No results found for: HGBA1C Lab Results  Component Value Date   WBC 7.0 01/26/2020   HGB 14.7 01/26/2020   HCT 42.5 01/26/2020   MCV 94 01/26/2020   PLT 238 01/26/2020   Lab Results  Component Value Date   ALT 26 01/26/2020   AST 23 01/26/2020   ALKPHOS 57 01/26/2020   BILITOT 0.3 01/26/2020   No results found for: 25OHVITD2, 25OHVITD3, VD25OH   Review of Systems  Constitutional:  Negative for chills, fever and malaise/fatigue.  HENT:  Negative for drooling, ear discharge, ear pain and sore throat.   Eyes:  Negative for blurred vision.  Respiratory:  Negative for cough,  shortness of breath and wheezing.   Cardiovascular:  Negative for chest pain, palpitations, orthopnea, leg swelling and PND.  Gastrointestinal:  Negative for abdominal pain, blood in stool, constipation, diarrhea and nausea.  Endocrine: Negative for polydipsia.  Genitourinary:  Negative for dysuria, frequency, hematuria and urgency.  Musculoskeletal:  Negative for back pain, myalgias and neck pain.  Skin:  Negative for rash.  Allergic/Immunologic: Negative for environmental allergies.  Neurological:  Negative for dizziness and headaches.  Hematological:  Bruises/bleeds easily.  Psychiatric/Behavioral:  Negative for suicidal ideas. The patient is not nervous/anxious.    Patient Active Problem List   Diagnosis Date Noted   Cervical spondylosis with radiculopathy 02/25/2021   Mass of soft tissue of upper arm 01/31/2021   Elevated blood-pressure reading, without diagnosis of hypertension 08/26/2020   Menorrhagia with irregular cycle 04/02/2020   Rotator cuff impingement syndrome, left 12/15/2018   Cervical myofascial pain syndrome 02/14/2016   History of breast cancer 11/07/2015   Postlaminectomy syndrome, lumbar region 04/12/2012   Chemotherapy-induced neuropathy ([place]) 04/12/2012   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 ([place]) 08/07/2011    Allergies  Allergen Reactions   Penicillins Anaphylaxis   Pregabalin Other (See Comments)    Thoughts of self harm   Sulfa Antibiotics Anaphylaxis   Ondansetron     migraines   Other    Zofran [Ondansetron Hcl]     migraines    Past Surgical History:  Procedure Laterality Date  BREAST BIOPSY Right 07/2011   invasive ductal carcinoma   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 RAd and chemo tx   BREAST SURGERY     mastectomy Right    partial with lymph node dissection   PORT A CATH REVISION     PORT-A-CATH REMOVAL  11-07-15   Dr Bary Castilla   SHOULDER ARTHROSCOPY WITH  ROTATOR CUFF REPAIR Left 05/18/2019   Procedure: SHOULDER ARTHROSCOPY WITH SUBACROMIAL DECOMPRESSION AND DISTAL CLAVICAL EXCISION, INTRAARTICULAR DEBRIDEMENT;  Surgeon: Lovell Sheehan, MD;  Location: [facility];  Service: Orthopedics;  Laterality: Left;   SPINE SURGERY  2008   Dr Joya Salm   TUBAL LIGATION      Social History   Tobacco Use   Smoking status: Former    Packs/day: 1.00    Years: 20.00    Pack years: 20.00    Types: Cigarettes    Quit date: 01/08/2020    Years since quitting: 1.5   Smokeless tobacco: Never  Vaping Use   Vaping Use: Never used  Substance Use Topics   Alcohol use: Not Currently   Drug use: Never     Medication list has been reviewed and updated.  Current Meds  Medication Sig   hydrochlorothiazide (HYDRODIURIL) 12.5 MG tablet TAKE 1 TABLET(12.5 MG) BY MOUTH DAILY   HYDROcodone-acetaminophen (NORCO) 10-325 MG tablet Take 1 tablet by mouth 5 (five) times daily as needed.   methocarbamol (ROBAXIN) 500 MG tablet TAKE 1 TABLET(500 MG) BY MOUTH TWICE DAILY AS NEEDED FOR MUSCLE SPASMS   nortriptyline (PAMELOR) 10 MG capsule Take 3 capsules (30 mg total) by mouth at bedtime.   zolpidem (AMBIEN) 10 MG tablet TAKE 1 TABLET BY MOUTH AT BEDTIME AS NEEDED FOR INSOMNIA   [DISCONTINUED] pregabalin (LYRICA) 25 MG capsule Take 1 capsule (25 mg total) by mouth 2 (two) times daily.    PHQ 2/9 Scores 07/22/2021 06/24/2021 06/03/2021 05/30/2021  PHQ - 2 Score 0 2 0 0  PHQ- 9 Score 0 - - -    GAD 7 : Generalized Anxiety Score 07/22/2021 04/21/2021 03/10/2021 02/25/2021  Nervous, Anxious, on Edge 0 0 0 0  Control/stop worrying 0 0 0 0  Worry too much - different things 0 0 0 0  Trouble relaxing 0 1 0 1  Restless 0 0 0 0  Easily annoyed or irritable 0 0 0 0  Afraid - awful might happen 0 0 0 0  Total GAD 7 Score 0 1 0 1  Anxiety Difficulty - Not difficult at all Not difficult at all Not difficult at all    BP Readings from Last 3 Encounters:  07/22/21 120/80   06/24/21 122/82  06/03/21 124/85    Physical Exam Vitals and nursing note reviewed.  Constitutional:      General: She is not in acute distress.    Appearance: She is not diaphoretic.  HENT:     Head: Normocephalic and atraumatic.     Right Ear: Tympanic membrane and external ear normal. There is no impacted cerumen.     Left Ear: Tympanic membrane and external ear normal. There is no impacted cerumen.     Ears:     Comments: Excoriation/left and ecchymosis right canal    Nose: Nose normal. No congestion or rhinorrhea.  Eyes:     General:        Right eye: No discharge.        Left eye: No discharge.  Conjunctiva/sclera: Conjunctivae normal.     Pupils: Pupils are equal, round, and reactive to light.  Neck:     Thyroid: No thyromegaly.     Vascular: No JVD.  Cardiovascular:     Rate and Rhythm: Normal rate and regular rhythm.     Heart sounds: Normal heart sounds. No murmur heard.   No friction rub. No gallop.  Pulmonary:     Effort: Pulmonary effort is normal.     Breath sounds: Normal breath sounds.  Abdominal:     General: Bowel sounds are normal.     Palpations: Abdomen is soft. There is no mass.     Tenderness: There is no abdominal tenderness. There is no guarding.  Musculoskeletal:        General: Normal range of motion.     Cervical back: Normal range of motion and neck supple.  Lymphadenopathy:     Cervical: No cervical adenopathy.  Skin:    General: Skin is warm and dry.  Neurological:     Mental Status: She is alert.     Deep Tendon Reflexes: Reflexes are normal and symmetric.    Wt Readings from Last 3 Encounters:  07/22/21 134 lb (60.8 kg)  06/24/21 132 lb 3.2 oz (60 kg)  06/03/21 133 lb (60.3 kg)    BP 120/80    Pulse 80    Ht _0  (1.6 m)    Wt 134 lb (60.8 kg)    LMP 07/16/2021 (Approximate)    BMI 23.74 kg/m   Assessment and Plan:  1. Essential hypertension Chronic.  Controlled.  Stable.  Course of disease improving.  Blood pressure  120/80.  Continue hydrochlorothiazide 12.5 mg once a day.  We will obtain renal function panel for GFR and electrolytes. - hydrochlorothiazide (HYDRODIURIL) 12.5 MG tablet; TAKE 1 TABLET(12.5 MG) BY MOUTH DAILY  Dispense: 90 tablet; Refill: 1 - Renal Function Panel  2. Hair loss New onset.  Negative palpation over the past couple weeks.  There is areas of hair loss and the vertex is well areas right temporal area.  This may be in a female pattern baldness area.  We will check TSH and refer to dermatology. - TSH - Ambulatory referral to Dermatology  3. Easy bruising Looking in the eardrums noted bruising patient denies any recent trauma patient states that she bruises easily and we will obtain a CBC to assess platelet count. - CBC with Differential/Platelet

## 2021-07-23 LAB — CBC WITH DIFFERENTIAL/PLATELET
Basophils Absolute: 0.1 10*3/uL (ref 0.0–0.2)
Basos: 1 %
EOS (ABSOLUTE): 0.6 10*3/uL — ABNORMAL HIGH (ref 0.0–0.4)
Eos: 7 %
Hematocrit: 41 % (ref 34.0–46.6)
Hemoglobin: 14 g/dL (ref 11.1–15.9)
Immature Grans (Abs): 0 10*3/uL (ref 0.0–0.1)
Immature Granulocytes: 0 %
Lymphocytes Absolute: 2.6 10*3/uL (ref 0.7–3.1)
Lymphs: 31 %
MCH: 32.3 pg (ref 26.6–33.0)
MCHC: 34.1 g/dL (ref 31.5–35.7)
MCV: 95 fL (ref 79–97)
Monocytes Absolute: 0.6 10*3/uL (ref 0.1–0.9)
Monocytes: 8 %
Neutrophils Absolute: 4.2 10*3/uL (ref 1.4–7.0)
Neutrophils: 53 %
Platelets: 274 10*3/uL (ref 150–450)
RBC: 4.33 x10E6/uL (ref 3.77–5.28)
RDW: 12.2 % (ref 11.7–15.4)
WBC: 8.1 10*3/uL (ref 3.4–10.8)

## 2021-07-23 LAB — RENAL FUNCTION PANEL
Albumin: 4.2 g/dL (ref 3.8–4.8)
BUN/Creatinine Ratio: 19 (ref 9–23)
BUN: 15 mg/dL (ref 6–24)
CO2: 23 mmol/L (ref 20–29)
Calcium: 9.3 mg/dL (ref 8.7–10.2)
Chloride: 103 mmol/L (ref 96–106)
Creatinine, Ser: 0.78 mg/dL (ref 0.57–1.00)
Glucose: 88 mg/dL (ref 70–99)
Phosphorus: 3.3 mg/dL (ref 3.0–4.3)
Potassium: 4.7 mmol/L (ref 3.5–5.2)
Sodium: 139 mmol/L (ref 134–144)
eGFR: 96 mL/min/{1.73_m2} (ref >59)

## 2021-07-23 LAB — TSH: TSH: 2.36 u[IU]/mL (ref 0.450–4.500)

## 2021-07-24 ENCOUNTER — Encounter: Payer: Self-pay | Admitting: Student in an Organized Health Care Education/Training Program

## 2021-07-24 ENCOUNTER — Ambulatory Visit
Payer: BC Managed Care – PPO | Attending: Student in an Organized Health Care Education/Training Program | Admitting: Student in an Organized Health Care Education/Training Program

## 2021-07-24 ENCOUNTER — Other Ambulatory Visit: Payer: Self-pay

## 2021-07-24 VITALS — BP 132/89 | HR 95 | Temp 97.5°F | Resp 16 | Ht 63.0 in | Wt 135.0 lb

## 2021-07-24 DIAGNOSIS — M961 Postlaminectomy syndrome, not elsewhere classified: Secondary | ICD-10-CM

## 2021-07-24 DIAGNOSIS — G620 Drug-induced polyneuropathy: Secondary | ICD-10-CM | POA: Diagnosis not present

## 2021-07-24 DIAGNOSIS — G894 Chronic pain syndrome: Secondary | ICD-10-CM

## 2021-07-24 DIAGNOSIS — M5412 Radiculopathy, cervical region: Secondary | ICD-10-CM | POA: Insufficient documentation

## 2021-07-24 DIAGNOSIS — T451X5A Adverse effect of antineoplastic and immunosuppressive drugs, initial encounter: Secondary | ICD-10-CM

## 2021-07-24 DIAGNOSIS — M4802 Spinal stenosis, cervical region: Secondary | ICD-10-CM | POA: Diagnosis not present

## 2021-07-24 DIAGNOSIS — M4722 Other spondylosis with radiculopathy, cervical region: Secondary | ICD-10-CM

## 2021-07-24 NOTE — Progress Notes (Signed)
Safety precautions to be maintained throughout the outpatient stay will include: orient to surroundings, keep bed in low position, maintain call bell within reach at all times, provide assistance with transfer out of bed and ambulation.  

## 2021-07-24 NOTE — Progress Notes (Signed)
Patient: Elizabeth Torres  Service Category: E/M  Provider: Gillis Santa, MD  DOB: 11/09/1975  DOS: 07/24/2021  Referring Provider: Juline Patch, MD  MRN: [id number]  Setting: Ambulatory outpatient  PCP: Juline Patch, MD  Type: New Patient  Specialty: Interventional Pain Management    Location: Office  Delivery: Face-to-face     Primary Reason(s) for Visit: Encounter for initial evaluation of one or more chronic problems (new to examiner) potentially causing chronic pain, and posing a threat to normal musculoskeletal function. (Level of risk: High) CC: Neck Pain  HPI  Elizabeth Torres is a 45 y.o. year old, Elizabeth Torres patient, who comes for the first time to our practice referred by Juline Patch, MD for our initial evaluation of her chronic pain. 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 Today she comes in for evaluation of her Neck Pain  Pain Assessment: Location: Right, Left Neck Radiating: left side is worse, radiates to shoulders bilateral down arms, effects thumb and ring finger bilaterally Onset: More than a month ago Duration: Chronic pain (1 year) Quality: Aching, Sharp, Shooting, Discomfort, Numbness (changes in teperature sensation) Severity: 10-Worst pain ever/10 (subjective, self-reported pain score)  Effect on ADL: depressed ,cying, mad, Lifting, holding, grabbing things Timing: Constant Modifying factors: heating pad, position chnage BP: 132/89   HR: 95  Onset and Duration: Gradual Cause of  pain: Unknown Severity: Getting worse, NAS-11 at its worse: 10/10, NAS-11 at its best: 5/10, NAS-11 now: 10/10, and NAS-11 on the average: 8/10 Timing: Not influenced by the time of the day Aggravating Factors: Lifiting, Motion, Prolonged sitting, and Twisting Alleviating Factors: Hot packs and Medications Associated Problems: Constipation, Day-time cramps, Night-time cramps, Dizziness, Fatigue, Inability to control bladder (urine), Numbness, Spasms, Swelling, Tingling, Weakness, and Pain that wakes patient up Quality of Pain: Constant, Sharp, Shooting, Tender, and Tingling Previous Examinations or Tests: MRI scan and X-rays Previous Treatments: TENS  Elizabeth Torres is a pleasant 45 year old Elizabeth Torres who presents with a chief complaint of neck pain that radiates into the bilateral hands today it is greater on her left than her right.  This has been going on since the summer.  No inciting or traumatic event.  She also endorses numbness of bilateral hands as well as weakness.  Cervical MRI has been completed, results of which are below.  She has tried physical therapy in the past but is fairly limited in her ability to perform home exercises given increased pain.  Patient also has a history of lumbar spinal fusion and has chronic lumbar radicular pain as well however she states that her radiating arm pain is most distressing and problematic for her.  She is managed on hydrocodone by her primary care provider.  Continue medication management with PCP.  I will be focusing primarily on interventional pain management  .   Historic Controlled Substance Pharmacotherapy Review  PMP and historical list of controlled substances: Hydrocodone 10 mg every 4-6 hours as needed, quantity 150/month, MME equals 50, prescribed and managed by PCP. Historical Monitoring: The patient  reports no history of drug use. List of all UDS Test(s): Lab Results  Component Value  Date   MDMA NEG 04/17/2015   MDMA NEG 11/14/2014    MDMA NEG 08/09/2014   COCAINSCRNUR NEG 04/17/2015   COCAINSCRNUR NEG 11/14/2014   COCAINSCRNUR NEG 08/09/2014   THCU NEG 04/17/2015   THCU NEG 11/14/2014   THCU NEG 08/09/2014   List of other Serum/Urine Drug Screening Test(s):  Lab Results  Component Value Date   COCAINSCRNUR NEG 04/17/2015   COCAINSCRNUR NEG 11/14/2014   COCAINSCRNUR NEG 08/09/2014   THCU NEG 04/17/2015   THCU NEG 11/14/2014   THCU NEG 08/09/2014   Historical Background Evaluation: [place] PMP: PDMP not reviewed this encounter. Online review of the past 77-monthperiod conducted.               Department of public safety, offender search: (Editor, commissioningInformation) Non-contributory Risk Assessment Profile: Aberrant behavior: None observed or detected today Risk factors for fatal opioid overdose: None identified today Fatal overdose hazard ratio (HR): Calculation deferred Non-fatal overdose hazard ratio (HR): Calculation deferred Risk of opioid abuse or dependence: 0.7-3.0% with doses ? 36 MME/day and 6.1-26% with doses ? 120 MME/day. Substance use disorder (SUD) risk level: See below Personal History of Substance Abuse (SUD-Substance use disorder):  Alcohol: Negative  Illegal Drugs: Negative  Rx Drugs:    ORT Risk Level calculation: Low Risk  Opioid Risk Tool - 07/24/21 1255       Family History of Substance Abuse   Alcohol Negative    Illegal Drugs Positive Elizabeth Torres   Elizabeth Torres   Rx Drugs Negative      Personal History of Substance Abuse   Alcohol Negative    Illegal Drugs Negative      Psychological Disease   Psychological Disease --   Mother Bipolar   Depression Negative      Total Score   Opioid Risk Tool Scoring 3    Opioid Risk Interpretation Low Risk            ORT Scoring interpretation table:  Score <3 = Low Risk for SUD  Score between 4-7 = Moderate Risk for SUD  Score >8 = High Risk for Opioid Abuse   PHQ-2 Depression Scale:  Total score: 4  PHQ-2 Scoring interpretation table: (Score and  probability of major depressive disorder)  Score 0 = No depression  Score 1 = 15.4% Probability  Score 2 = 21.1% Probability  Score 3 = 38.4% Probability  Score 4 = 45.5% Probability  Score 5 = 56.4% Probability  Score 6 = 78.6% Probability   PHQ-9 Depression Scale:  Total score: 13  PHQ-9 Scoring interpretation table:  Score 0-4 = No depression  Score 5-9 = Mild depression  Score 10-14 = Moderate depression  Score 15-19 = Moderately severe depression  Score 20-27 = Severe depression (2.4 times higher risk of SUD and 2.89 times higher risk of overuse)   Pharmacologic Plan:  Medication management to continue with PCP,  we will focus primarily on interventional pain management               Meds   Current Outpatient Medications:    hydrochlorothiazide (HYDRODIURIL) 12.5 MG tablet, TAKE 1 TABLET(12.5 MG) BY MOUTH DAILY, Disp: 90 tablet, Rfl: 1   HYDROcodone-acetaminophen (NORCO) 10-325 MG tablet, Take 1 tablet by mouth 5 (five) times daily as needed., Disp: 150 tablet, Rfl: 0   ibuprofen (ADVIL) 600 MG tablet, Take 600 mg by mouth every 6 (six) hours as needed., Disp: , Rfl:    methocarbamol (ROBAXIN) 500 MG tablet, TAKE 1 TABLET(500  MG) BY MOUTH TWICE DAILY AS NEEDED FOR MUSCLE SPASMS, Disp: 60 tablet, Rfl: 2   nortriptyline (PAMELOR) 10 MG capsule, Take 3 capsules (30 mg total) by mouth at bedtime., Disp: 270 capsule, Rfl: 1   zolpidem (AMBIEN) 10 MG tablet, TAKE 1 TABLET BY MOUTH AT BEDTIME AS NEEDED FOR INSOMNIA, Disp: 30 tablet, Rfl: 3  Imaging Review  Cervical Imaging: Cervical MR wo contrast: Results for orders placed during the hospital encounter of 04/29/21  MR Cervical Spine Wo Contrast  Narrative CLINICAL DATA:  Neck pain for 6 months with bilateral arm pain and hand numbness  EXAM: MRI CERVICAL SPINE WITHOUT CONTRAST  TECHNIQUE: Multiplanar, multisequence MR imaging of the cervical spine was performed. No intravenous contrast was administered.  COMPARISON:   Cervical myelogram 05/09/2010  FINDINGS: Alignment: Reversal of cervical lordosis  Vertebrae: No fracture, evidence of discitis, or bone lesion.  Cord: Normal signal and morphology.  Posterior Fossa, vertebral arteries, paraspinal tissues: Negative.  Disc levels:  C2-3: Unremarkable.  C3-4: Unremarkable.  C4-5: Disc narrowing and right eccentric bulging with right uncovertebral ridging. Moderate right foraminal impingement  C5-6: Disc narrowing and bulging with broad protrusion. Bilateral uncovertebral spurring asymmetric to the right. Spinal stenosis that causes slight ventral cord indentation. Right more than left foraminal impingement  C6-7: Mild right uncovertebral ridging. Disc narrowing and bulging with shallow right paracentral protrusion contacting the ventral cord. Patent foramina  C7-T1:Unremarkable.  IMPRESSION: 1. Disc degeneration with reversal of cervical lordosis that has progressed from a 2011 myelogram. 2. Disc protrusions contact the ventral cord at C5-6 and C6-7. Diffusely patent spinal canal. 3. Right foraminal impingement at C4-5 and especially C5-6. 4. Moderate left foraminal impingement at C5-6.   Electronically Signed By: Jorje Guild M.D. On: 04/30/2021 07:49 Narrative Clinical Data:  Right upper extremity pain  CT MYELOGRAPHY CERVICAL SPINE  Technique:  CT imaging of the cervical spine was performed after intrathecal contrast administration. Multiplanar CT image reconstructions were also generated.  Comparison:  None.  Findings:  Reversal of cervical lordosis is likely positional.  No vertebral height loss.  2 mm tonsillar ectopia.  C2-3:  Unremarkable.  C3-4:  Unremarkable.  C4-5:  Minimal posterior disc bulge without spinal or foraminal stenosis.  C5-6:  Moderate posterior disc bulge which effaces the anterior thecal sac to the cord.  AP diameter of the sac is 9 mm. Mild central stenosis.  C6-7:  Mild posterior disc  bulge without stenosis.  C7-T1:  Unremarkable.  Lung apices are unremarkable.  IMPRESSION: Mild degenerative change.  Mild central stenosis at C5-6.  Provider: Curtis Sites DG Cervical Spine 2 or 3 views  Narrative CLINICAL DATA:  Right-sided neck and arm pain for 9 days. No known injury.  EXAM: CERVICAL SPINE - 2-3 VIEW  COMPARISON:  None.  FINDINGS: There is no evidence of cervical spine fracture or prevertebral soft tissue swelling. Alignment is normal.  Moderate degenerative disc disease and uncovertebral spurring is seen at C5-6. Other intervertebral disc spaces are maintained. Mild cervical kyphosis also noted. No other bone abnormality identified.  IMPRESSION: No acute findings.  Moderate degenerative disc disease and uncovertebral spurring at C5-6. Mild cervical kyphosis.   Electronically Signed By: Earle Gell M.D. On: 12/18/2015 14:40  Narrative CLINICAL DATA:  LEFT UPPER extremity deformity. Suspect biceps tendon tear. RIGHT neck pain radiates into the RIGHT trapezius. Obvious deformity anterior distal humerus about the elbow, chronic.  EXAM: CERVICAL SPINE - COMPLETE 4+ VIEW  COMPARISON:  12/18/2015  FINDINGS: There is  loss of cervical lordosis. This may be secondary to splinting, soft tissue injury, or positioning. There is disc height loss at C5-6. There is 4 millimeters retrolisthesis of C5 on C diff, increased compared to prior study. Significant disc height loss and uncovertebral spurring also noted at C6-7. There is no acute fracture or subluxation. Prevertebral soft tissues are unremarkable. Lung apices are clear.  IMPRESSION: Mid cervical degenerative changes.  Loss of cervical lordosis.   Electronically Signed By: Nolon Nations M.D. On: 02/03/2021 13:28  MR Shoulder Left Wo Contrast  Narrative CLINICAL DATA:  Left shoulder pain. History of prior subacromial decompression in 2020.  EXAM: MRI OF THE LEFT SHOULDER WITHOUT  CONTRAST  TECHNIQUE: Multiplanar, multisequence MR imaging of the shoulder was performed. No intravenous contrast was administered.  COMPARISON:  Left shoulder x-rays dated January 31, 2021.  FINDINGS: Rotator cuff:  Intact rotator cuff.  Mild supraspinatus tendinosis.  Muscles: No atrophy or abnormal signal of the muscles of the rotator cuff.  Biceps long head:  Prior tenotomy.  Acromioclavicular Joint: Prior distal clavicle resection and acromioplasty. No subacromial/subdeltoid bursal fluid.  Glenohumeral Joint: No joint effusion. No chondral defect.  Labrum: Grossly intact, but evaluation is limited by lack of intraarticular fluid.  Bones:  No marrow abnormality, fracture or dislocation.  Other: None.  IMPRESSION: 1. Intact rotator cuff. Mild supraspinatus tendinosis. 2. Prior biceps tenotomy. 3. Prior distal clavicle resection and acromioplasty.   Electronically Signed By: Titus Dubin M.D. On: 02/20/2021 17:11  Narrative CLINICAL DATA:  Neck pain into right arm and hand 9 days. No injury. Palpable abnormality over the suprascapular region of the right shoulder.  EXAM: RIGHT SHOULDER - 2+ VIEW  COMPARISON:  None.  FINDINGS: There is no evidence of fracture or dislocation. There is no evidence of arthropathy or other focal bone abnormality. Soft tissues are unremarkable.  IMPRESSION: Negative.   Electronically Signed By: Marin Olp M.D. On: 12/18/2015 14:38   Narrative CLINICAL DATA:  LEFT UPPER extremity deformity. Suspect biceps tendon tear.  EXAM: LEFT SHOULDER - 2+ VIEW  COMPARISON:  None  FINDINGS: There is no acute fracture or subluxation. Resection/resorption of the distal clavicle. LEFT lung apex is clear.  IMPRESSION: No evidence for acute  abnormality.   Electronically Signed By: Nolon Nations M.D. On: 02/03/2021 13:26   CT Lumbar Spine W Contrast  Narrative Clinical Data:  Low back pain  CT MYELOGRAPHY LUMBAR  SPINE  Technique:  CT imaging of the lumbar spine was performed after intrathecal contrast administration.  Multiplanar CT image reconstructions were also generated.  Comparison:  01/07/2010  Findings:  Discectomy and posterior fusion at L5-S1 has been performed with bilateral pedicle screws. Right L5 pedicle screw encroaches upon the superior right L5-S1 foramen.  No breakage or loosening of the hardware.  3 mm anterolisthesis L5 upon S1.  No vertebral body height loss.  Conus medullaris terminates at the inferior L1 endplate.  Solid fusion has not yet occurred through the disc space.  Bone graft material is present across the posterior elements without solid fusion.  L1-2:  Unremarkable.  L2-3:  Unremarkable.  L3-4:  Unremarkable.  L4-5:  Unremarkable.  L5-S1:  The large disc extrusion has been removed.  No evidence of recurrent disc herniation.  The central canal is widely patent. Lateral recesses are patent.  There is slight clumping of the right sacral nerve root sleeves at the L5 S1 disc level.  S1 no overt sleeves are slightly irregular likely due to postoperative  change.  IMPRESSION: L5-S1 fusion as described.  Solid fusion has not yet occurred. Disc extrusion has been surgically corrected.  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  Provider: Curtis Sites Narrative Clinical Data: L5-S1 PLIF.  LUMBAR SPINE - 1 VIEW  Comparison: Lumbar spine MR 01/07/2010  Findings: A single intraoperative fluoroscopic spot view of the lumbosacral junction is submitted in the frontal projection. Pedicle screws are seen at L5 and S1.  Osseous detail is degraded by technique.  IMPRESSION: Intraoperative localization, as above.  Provider: Rober Minion   DG Lumbar Spine 2-3 Views  Narrative Clinical Data: Low back pain.  LUMBAR SPINE - 2-3 VIEW  Comparison: 12/13/2009  Findings: Alignment is anatomic.  Endplate  degenerative changes, loss of disc space height and facet hypertrophy are worst at L5-S1. There is gaseous distention of bowel in the visualized abdomen and pelvis.  IMPRESSION:  1.  Spondylosis, worst at L5-S1. 2.  Gaseous distention of small bowel and colon.  Provider: Bryson Corona   Complexity Note: Imaging results reviewed. Results shared with Ms. Rhyner, using Layman's terms.                         ROS  Cardiovascular: High blood pressure Pulmonary or Respiratory: No reported pulmonary signs or symptoms such as wheezing and difficulty taking a deep full breath (Asthma), difficulty blowing air out (Emphysema), coughing up mucus (Bronchitis), persistent dry cough, or temporary stoppage of breathing during sleep Neurological: Curved spine and Incontinence:  Urinary Psychological-Psychiatric: No reported psychological or psychiatric signs or symptoms such as difficulty sleeping, anxiety, depression, delusions or hallucinations (schizophrenial), mood swings (bipolar disorders) or suicidal ideations or attempts Gastrointestinal: No reported gastrointestinal signs or symptoms such as vomiting or evacuating blood, reflux, heartburn, alternating episodes of diarrhea and constipation, inflamed or scarred liver, or pancreas or irrregular and/or infrequent bowel movements Genitourinary: Difficulty emptying the bladder or controlling the flow of urine (Neurogenic bladder) Hematological: Brusing easily Endocrine: No reported endocrine signs or symptoms such as high or low blood sugar, rapid heart rate due to high thyroid levels, obesity or weight gain due to slow thyroid or thyroid disease Rheumatologic: No reported rheumatological signs and symptoms such as fatigue, joint pain, tenderness, swelling, redness, heat, stiffness, decreased range of motion, with or without associated rash Musculoskeletal: Negative for myasthenia gravis, muscular dystrophy, multiple sclerosis or malignant hyperthermia Work  History: Working full time  Allergies  Ms. Lucchese is allergic to penicillins, pregabalin, sulfa antibiotics, ondansetron, other, and zofran [ondansetron hcl].  Laboratory Chemistry Profile   Renal Lab Results  Component Value Date   BUN 15 07/22/2021   CREATININE 0.78 07/22/2021   LABCREA 12.15 (L) 04/17/2015   BCR 19 07/22/2021   GFRAA 124 01/26/2020   GFRNONAA 107 01/26/2020   PROTEINUR Negative 08/28/2011     Electrolytes Lab Results  Component Value Date   NA 139 07/22/2021   K 4.7 07/22/2021   CL 103 07/22/2021   CALCIUM 9.3 07/22/2021   PHOS 3.3 07/22/2021     Hepatic Lab Results  Component Value Date   AST 23 01/26/2020   ALT 26 01/26/2020   ALBUMIN 4.2 07/22/2021   ALKPHOS 57 01/26/2020     ID Lab Results  Component Value Date   SARSCOV2NAA POSITIVE (A) 04/29/2020   STAPHAUREUS  12/11/2009    NEGATIVE        The Xpert SA Assay (FDA approved for NASAL specimens only),  is one component of a comprehensive surveillance program.  It is not intended to diagnose infection nor to guide or monitor treatment.   MRSAPCR NEGATIVE 12/11/2009   PREGTESTUR NEGATIVE 05/18/2019     Bone No results found for: VD25OH, [id number], OL0786LJ4, GB2010OF1, 25OHVITD1, 25OHVITD2, 25OHVITD3, TESTOFREE, TESTOSTERONE   Endocrine Lab Results  Component Value Date   GLUCOSE 88 07/22/2021   GLUCOSEU Negative 08/28/2011   TSH 2.360 07/22/2021     Neuropathy No results found for: VITAMINB12, FOLATE, HGBA1C, HIV   CNS No results found for: COLORCSF, APPEARCSF, RBCCOUNTCSF, WBCCSF, POLYSCSF, LYMPHSCSF, EOSCSF, PROTEINCSF, GLUCCSF, JCVIRUS, CSFOLI, IGGCSF, LABACHR, ACETBL, LABACHR, ACETBL   Inflammation (CRP: Acute   ESR: Chronic) Lab Results  Component Value Date   ESRSEDRATE 15 01/06/2010     Rheumatology No results found for: RF, ANA, LABURIC, URICUR, LYMEIGGIGMAB, LYMEABIGMQN, HLAB27   Coagulation Lab Results  Component Value Date   INR 0.91 01/05/2010   LABPROT  12.2 01/05/2010   APTT 27 01/05/2010   PLT 274 07/22/2021     Cardiovascular Lab Results  Component Value Date   HGB 14.0 07/22/2021   HCT 41.0 07/22/2021     Screening Lab Results  Component Value Date   SARSCOV2NAA POSITIVE (A) 04/29/2020   STAPHAUREUS  12/11/2009    NEGATIVE        The Xpert SA Assay (FDA approved for NASAL specimens only), is one component of a comprehensive surveillance program.  It is not intended to diagnose infection nor to guide or monitor treatment.   MRSAPCR NEGATIVE 12/11/2009   PREGTESTUR NEGATIVE 05/18/2019     Cancer Lab Results  Component Value Date   LABCA2 31.7 10/13/2016     Allergens No results found for: ALMOND, APPLE, ASPARAGUS, AVOCADO, BANANA, BARLEY, BASIL, BAYLEAF, GREENBEAN, LIMABEAN, WHITEBEAN, BEEFIGE, REDBEET, BLUEBERRY, BROCCOLI, CABBAGE, MELON, CARROT, CASEIN, CASHEWNUT, CAULIFLOWER, CELERY     Note: Lab results reviewed.  [place]  Drug: Ms. Cerino  reports no history of drug use. Alcohol:  reports that she does not currently use alcohol. Tobacco:  reports that she quit smoking about 18 months ago. Her smoking use included cigarettes. She has a 20.00 pack-year smoking history. She has never used smokeless tobacco. Medical:  has a past medical history of BRCA negative, Breast cancer ([place]) (2013), Degenerative disc disease, lumbar, Personal history of chemotherapy (2013), Personal history of radiation therapy (2013), Radiation (2013), and Status post chemotherapy (2013). Family: family history includes Breast cancer in her paternal aunt and paternal uncle; Cancer in her paternal uncle; Diabetes in her mother; Hypertension in her mother; Other in her father; Pulmonary fibrosis in her mother.  Past Surgical History:  Procedure Laterality Date   BREAST BIOPSY Right 07/2011   invasive ductal carcinoma   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 RAd and chemo tx   BREAST SURGERY      mastectomy Right    partial with lymph node dissection   PORT A CATH REVISION     PORT-A-CATH REMOVAL  11-07-15   Dr Bary Castilla   SHOULDER ARTHROSCOPY WITH ROTATOR CUFF REPAIR Left 05/18/2019   Procedure: SHOULDER ARTHROSCOPY WITH SUBACROMIAL DECOMPRESSION AND DISTAL CLAVICAL EXCISION, INTRAARTICULAR DEBRIDEMENT;  Surgeon: Lovell Sheehan, MD;  Location: [facility];  Service: Orthopedics;  Laterality: Left;   SPINE SURGERY  2008   Dr Joya Salm   TUBAL LIGATION     Active Ambulatory Problems    Diagnosis Date Noted   Primary cancer of lower outer  quadrant of right Elizabeth Torres breast ([place]) 08/07/2011   Postlaminectomy syndrome, lumbar region 04/12/2012   Chemotherapy-induced neuropathy ([place]) 04/12/2012   History of breast cancer 11/07/2015   Cervical myofascial pain syndrome 02/14/2016   Rotator cuff impingement syndrome, left 12/15/2018   Menorrhagia with irregular cycle 04/02/2020   Elevated blood-pressure reading, without diagnosis of hypertension 08/26/2020   Mass of soft tissue of upper arm 01/31/2021   Cervical spondylosis with radiculopathy 02/25/2021   Cervical radicular pain 07/24/2021   Spinal stenosis in cervical region 07/24/2021   Chronic pain syndrome 07/24/2021   Resolved Ambulatory Problems    Diagnosis Date Noted   No Resolved Ambulatory Problems   Past Medical History:  Diagnosis Date   BRCA negative    Breast cancer ([place]) 2013   Degenerative disc disease, lumbar    Personal history of chemotherapy 2013   Personal history of radiation therapy 2013   Radiation 2013   Status post chemotherapy 2013   Constitutional Exam  General appearance: Well nourished, well developed, and well hydrated. In no apparent acute distress Vitals:   07/24/21 1244  BP: 132/89  Pulse: 95  Resp: 16  Temp: (!) 97.5 F (36.4 C)  SpO2: 98%  Weight: 135 lb (61.2 kg)  Height: '5\' 3"'  (1.6 m)   BMI Assessment: Estimated body mass index is 23.91 kg/m as calculated from the  following:   Height as of this encounter: '5\' 3"'  (1.6 m).   Weight as of this encounter: 135 lb (61.2 kg).  BMI interpretation table: BMI level Category Range association with higher incidence of chronic pain  <18 kg/m2 Underweight   18.5-24.9 kg/m2 Ideal body weight   25-29.9 kg/m2 Overweight Increased incidence by 20%  30-34.9 kg/m2 Obese (Class I) Increased incidence by 68%  35-39.9 kg/m2 Severe obesity (Class II) Increased incidence by 136%  >40 kg/m2 Extreme obesity (Class III) Increased incidence by 254%   Patient's current BMI Ideal Body weight  Body mass index is 23.91 kg/m. Ideal body weight: 52.4 kg (115 lb 8.3 oz) Adjusted ideal body weight: 55.9 kg (123 lb 5 oz)   BMI Readings from Last 4 Encounters:  07/24/21 23.91 kg/m  07/22/21 23.74 kg/m  06/24/21 23.42 kg/m  06/03/21 23.56 kg/m   Wt Readings from Last 4 Encounters:  07/24/21 135 lb (61.2 kg)  07/22/21 134 lb (60.8 kg)  06/24/21 132 lb 3.2 oz (60 kg)  06/03/21 133 lb (60.3 kg)    Psych/Mental status: Alert, oriented x 3 (person, place, & time)       Eyes: PERLA Respiratory: No evidence of acute respiratory distress  Cervical Spine Area Exam  Skin & Axial Inspection: No masses, redness, edema, swelling, or associated skin lesions Alignment: Symmetrical Functional ROM: Pain restricted ROM      Stability: No instability detected Muscle Tone/Strength: Functionally intact. No obvious neuro-muscular anomalies detected. Sensory (Neurological): Dermatomal pain pattern Palpation: No palpable anomalies              Positive Spurling's bilaterally  Upper Extremity (UE) Exam    Side: Right upper extremity  Side: Left upper extremity  Skin & Extremity Inspection: Skin color, temperature, and hair growth are WNL. No peripheral edema or cyanosis. No masses, redness, swelling, asymmetry, or associated skin lesions. No contractures.  Skin & Extremity Inspection: Skin color, temperature, and hair growth are WNL. No  peripheral edema or cyanosis. No masses, redness, swelling, asymmetry, or associated skin lesions. No contractures.  Functional ROM: Pain restricted ROM for shoulder and elbow  Functional ROM: Pain restricted ROM for shoulder and elbow  Muscle Tone/Strength: Functionally intact. No obvious neuro-muscular anomalies detected.  Muscle Tone/Strength: Functionally intact. No obvious neuro-muscular anomalies detected.  Sensory (Neurological): Dermatomal pain pattern          Sensory (Neurological): Dermatomal pain pattern          Palpation: No palpable anomalies              Palpation: No palpable anomalies              Provocative Test(s):  Phalen's test: deferred Tinel's test: deferred Apley's scratch test (touch opposite shoulder):  Action 1 (Across chest): Decreased ROM Action 2 (Overhead): Decreased ROM Action 3 (LB reach): Decreased ROM   Provocative Test(s):  Phalen's test: deferred Tinel's test: deferred Apley's scratch test (touch opposite shoulder):  Action 1 (Across chest): Decreased ROM Action 2 (Overhead): Decreased ROM Action 3 (LB reach): Decreased ROM    Lumbar Spine Area Exam  Skin & Axial Inspection: Well healed scar from previous spine surgery detected Alignment: Symmetrical Functional ROM: Pain restricted ROM       Stability: No instability detected Muscle Tone/Strength: Functionally intact. No obvious neuro-muscular anomalies detected. Sensory (Neurological): Dermatomal pain pattern L5/S1 fusion  Gait & Posture Assessment  Ambulation: Unassisted Gait: Relatively normal for age and body habitus Posture: WNL  Lower Extremity Exam    Side: Right lower extremity  Side: Left lower extremity  Stability: No instability observed          Stability: No instability observed          Skin & Extremity Inspection: Skin color, temperature, and hair growth are WNL. No peripheral edema or cyanosis. No masses, redness, swelling, asymmetry, or associated skin lesions. No  contractures.  Skin & Extremity Inspection: Skin color, temperature, and hair growth are WNL. No peripheral edema or cyanosis. No masses, redness, swelling, asymmetry, or associated skin lesions. No contractures.  Functional ROM: Unrestricted ROM                  Functional ROM: Unrestricted ROM                  Muscle Tone/Strength: Functionally intact. No obvious neuro-muscular anomalies detected.  Muscle Tone/Strength: Functionally intact. No obvious neuro-muscular anomalies detected.  Sensory (Neurological): Unimpaired        Sensory (Neurological): Unimpaired        DTR: Patellar: deferred today Achilles: deferred today Plantar: deferred today  DTR: Patellar: deferred today Achilles: deferred today Plantar: deferred today  Palpation: No palpable anomalies  Palpation: No palpable anomalies    Assessment  Primary Diagnosis & Pertinent Problem List: The primary encounter diagnosis was Cervical radicular pain. Diagnoses of Spinal stenosis in cervical region, Cervical spondylosis with radiculopathy, Postlaminectomy syndrome, lumbar region, Chemotherapy-induced neuropathy ([place]), and Chronic pain syndrome were also pertinent to this visit.  Visit Diagnosis (New problems to examiner): 1. Cervical radicular pain   2. Spinal stenosis in cervical region   3. Cervical spondylosis with radiculopathy   4. Postlaminectomy syndrome, lumbar region   5. Chemotherapy-induced neuropathy ([place])   6. Chronic pain syndrome    Plan of Care (Initial workup plan)   I reviewed the patient's cervical MRI with her in great detail.  She has progressed disc degeneration from 2011.  She has significant disc protrusions at C5-C6 and C6-C7 to contact the ventral cord with right foraminal impingement at C4-C5 and C5-C6 along with left foraminal patient at C5-C6 which is  moderate.  She is on multimodal analgesics which includes hydrocodone, ibuprofen, Robaxin, nortriptyline managed by primary care provider.  I have  instructed her to continue med management with PCP.  She has also done physical therapy in the past with limited response.  She was given home cervical spine stretching exercises for her neck pain which she has had limited success with.  I recommend a cervical epidural steroid injection under fluoroscopy.  Risks and benefits reviewed and patient would like to proceed.  Future considerations include spinal cord stimulator trial failed back surgical syndrome given history of L5-S1 fusion with persistent chronic lumbar radicular pain.  Procedure Orders         Cervical Epidural Injection     Provider-requested follow-up: Return in about 13 days (around 08/06/2021) for C-ESI , without sedation.  Future Appointments  Date Time Provider [place]  07/24/2021  2:00 PM Gillis Santa, MD [facility] None  08/05/2021  2:45 PM Kirsteins, Luanna Salk, MD [facility] CPR  08/06/2021  9:20 AM Gillis Santa, MD [facility] None  10/24/2021  8:40 AM Juline Patch, MD [facility]  01/20/2022 10:00 AM Juline Patch, MD [facility]  01/27/2022 10:45 AM [facility] None  01/27/2022 11:00 AM Lloyd Huger, MD [facility] None   I spent a total of 60 minutes reviewing chart data, face-to-face evaluation with the patient, counseling and coordination of care as detailed above.   Note by: Gillis Santa, MD Date: 07/24/2021; Time: 1:51 PM

## 2021-07-24 NOTE — Patient Instructions (Signed)
______________________________________________________________________ ° °Preparing for Procedure with Sedation ° °NOTICE: Due to recent regulatory changes, starting on March 03, 2021, procedures requiring intravenous (IV) sedation will no longer be performed at the [facility].  These types of procedures are required to be performed at [facility] ambulatory surgery facility.  We are very sorry for the inconvenience. ° °Procedure appointments are limited to planned procedures: °No Prescription Refills. °No disability issues will be discussed. °No medication changes will be discussed. ° °Instructions: °Oral Intake: Do not eat or drink anything for at least 8 hours prior to your procedure. (Exception: Blood Pressure Medication. See below.) °Transportation: A driver is required. You may not drive yourself after the procedure. °Blood Pressure Medicine: Do not forget to take your blood pressure medicine with a sip of water the morning of the procedure. If your Diastolic (lower reading) is above 100 mmHg, elective cases will be cancelled/rescheduled. °Blood thinners: These will need to be stopped for procedures. Notify our staff if you are taking any blood thinners. Depending on which one you take, there will be specific instructions on how and when to stop it. °Diabetics on insulin: Notify the staff so that you can be scheduled 1st case in the morning. If your diabetes requires high dose insulin, take only ½ of your normal insulin dose the morning of the procedure and notify the staff that you have done so. °Preventing infections: Shower with an antibacterial soap the morning of your procedure. °Build-up your immune system: Take 1000 mg of Vitamin C with every meal (3 times a day) the day prior to your procedure. °Antibiotics: Inform the staff if you have a condition or reason that requires you to take antibiotics before dental procedures. °Pregnancy: If you are pregnant, call and cancel the procedure. °Sickness: If  you have a cold, fever, or any active infections, call and cancel the procedure. °Arrival: You must be in the facility at least 30 minutes prior to your scheduled procedure. °Children: Do not bring children with you. °Dress appropriately: Bring dark clothing that you would not mind if they get stained. °Valuables: Do not bring any jewelry or valuables. ° °Reasons to call and reschedule or cancel your procedure: (Following these recommendations will minimize the risk of a serious complication.) °Surgeries: Avoid having procedures within 2 weeks of any surgery. (Avoid for 2 weeks before or after any surgery). °Flu Shots: Avoid having procedures within 2 weeks of a flu shots. (Avoid for 2 weeks before or after immunizations). °Barium: Avoid having a procedure within 7-10 days after having had a radiological study involving the use of radiological contrast. (Myelograms, Barium swallow or enema study). °Heart attacks: Avoid any elective procedures or surgeries for the initial 6 months after a "Myocardial Infarction" (Heart Attack). °Blood thinners: It is imperative that you stop these medications before procedures. Let us know if you if you take any blood thinner.  °Infection: Avoid procedures during or within two weeks of an infection (including chest colds or gastrointestinal problems). Symptoms associated with infections include: Localized redness, fever, chills, night sweats or profuse sweating, burning sensation when voiding, cough, congestion, stuffiness, runny nose, sore throat, diarrhea, nausea, vomiting, cold or Flu symptoms, recent or current infections. It is specially important if the infection is over the area that we intend to treat. °Heart and lung problems: Symptoms that may suggest an active cardiopulmonary problem include: cough, chest pain, breathing difficulties or shortness of breath, dizziness, ankle swelling, uncontrolled high or unusually low blood pressure, and/or palpitations. If you are    experiencing any of these symptoms, cancel your procedure and contact your primary care physician for an evaluation.  Remember:  Regular Business hours are:  Monday to Thursday 8:00 AM to 4:00 PM  Provider's Schedule: Milinda Pointer, MD:  Procedure days: Tuesday and Thursday 7:30 AM to 4:00 PM  Gillis Santa, MD:  Procedure days: Monday and Wednesday 7:30 AM to 4:00 PM ______________________________________________________________________  Epidural Steroid Injection An epidural steroid injection is a shot of steroid medicine and numbing medicine that is given into the space between the spinal cord and the bones of the back (epidural space). The shot helps relieve pain caused by an irritated or swollen nerve root. The amount of pain relief you get from the injection depends on what is causing the nerve to be swollen and irritated, and how long your pain lasts. You are more likely to benefit from this injection if your pain is strong and comes on suddenly rather than if you have had long-term (chronic) pain. Tell a health care provider about: Any allergies you have. All medicines you are taking, including vitamins, herbs, eye drops, creams, and over-the-counter medicines. Any problems you or family members have had with anesthetic medicines. Any blood disorders you have. Any surgeries you have had. Any medical conditions you have. Whether you are pregnant or may be pregnant. What are the risks? Generally, this is a safe procedure. However, problems may occur, including: Headache. Bleeding. Infection. Allergic reaction to medicines. Nerve damage. What happens before the procedure? Staying hydrated Follow instructions from your health care provider about hydration, which may include: Up to 2 hours before the procedure - you may continue to drink clear liquids, such as water, clear fruit juice, black coffee, and plain tea. Eating and drinking restrictions Follow instructions from  your health care provider about eating and drinking, which may include: 8 hours before the procedure - stop eating heavy meals or foods, such as meat, fried foods, or fatty foods. 6 hours before the procedure - stop eating light meals or foods, such as toast or cereal. 6 hours before the procedure - stop drinking milk or drinks that contain milk. 2 hours before the procedure - stop drinking clear liquids. Medicines You may be given medicines to lower anxiety. Ask your health care provider about: Changing or stopping your regular medicines. This is especially important if you are taking diabetes medicines or blood thinners. Taking medicines such as aspirin and ibuprofen. These medicines can thin your blood. Do not take these medicines unless your health care provider tells you to take them. Taking over-the-counter medicines, vitamins, herbs, and supplements. General instructions Ask your health care provider what steps will be taken to prevent infection. Plan to have a responsible adult take you home from the hospital or clinic. If you will be going home right after the procedure, plan to have a responsible adult care for you for the time you are told. This is important. What happens during the procedure? An IV will be inserted into one of your veins. You will be given one or more of the following: A medicine to help you relax (sedative). A medicine to numb the area (local anesthetic). You will be asked to lie on your abdomen or sit. The injection site will be cleaned. A needle will be inserted through your skin into the epidural space. This may cause you some discomfort. An X-ray machine will be used to guide the needle as close as possible to the affected nerve. A steroid medicine and a  local anesthetic will be injected into the epidural space. The needle and IV will be removed. A bandage (dressing) will be put over the injection site. The procedure may vary among health care providers and  hospitals. What can I expect after the procedure? Your blood pressure, heart rate, breathing rate, and blood oxygen level will be monitored until you leave the hospital or clinic. Your arm or leg may feel weak or numb for a few hours. The injection site may feel sore. Follow these instructions at home: Injection site care You may remove the bandage (dressing) after 24 hours. Check your injection site every day for signs of infection. Check for: Redness, swelling, or pain. Fluid or blood. Warmth. Pus or a bad smell. Managing pain, stiffness, and swelling For 24 hours after the procedure: Avoid using heat on the injection site. Do not take baths, swim, or use a hot tub until your health care provider approves. Ask your health care provider if you may take showers. You may only be allowed to take sponge baths. If directed, put ice on the injection site. To do this: Put ice in a plastic bag. Place a towel between your skin and the bag. Leave the ice on for 20 minutes, 2-3 times a day.  Activity If you were given a sedative during the procedure, it can affect you for several hours. Do not drive or operate machinery until your health care provider says that it is safe. Return to your normal activities as told by your health care provider. Ask your health care provider what activities are safe for you. General instructions Take over-the-counter and prescription medicines only as told by your health care provider. Drink enough fluid to keep your urine pale yellow. Keep all follow-up visits as told by your health care provider. This is important. Contact a health care provider if: You have any of these signs of infection: Redness, swelling, or pain around your injection site. Fluid or blood coming from your injection site. Warmth coming from your injection site. Pus or a bad smell coming from your injection site. A fever. You continue to have pain and soreness around the injection site,  even after taking over-the-counter pain medicine. You have severe, sudden, or lasting nausea or vomiting. Get help right away if: You have severe pain at the injection site that is not relieved by medicines. You develop a severe headache or a stiff neck. You become sensitive to light. You have any new numbness or weakness in your legs or arms. You lose control of your bladder or bowel movements. You have trouble breathing. Summary An epidural steroid injection is a shot of steroid medicine and numbing medicine that is given into the epidural space. The shot helps relieve pain caused by an irritated or swollen nerve root. You are more likely to benefit from this injection if your pain is strong and comes on suddenly rather than if you have had chronic pain. This information is not intended to replace advice given to you by your health care provider. Make sure you discuss any questions you have with your health care provider. Document Revised: 11/17/2019 Document Reviewed: 01/30/2019 Elsevier Patient Education  [place].

## 2021-07-28 ENCOUNTER — Other Ambulatory Visit: Payer: Self-pay | Admitting: Family Medicine

## 2021-07-28 DIAGNOSIS — M4722 Other spondylosis with radiculopathy, cervical region: Secondary | ICD-10-CM

## 2021-07-28 DIAGNOSIS — M7542 Impingement syndrome of left shoulder: Secondary | ICD-10-CM

## 2021-07-29 NOTE — Telephone Encounter (Signed)
Requested medications are due for refill today.  Unsure  Requested medications are on the active medications list.  no  Last refill. 04/21/2021  Future visit scheduled.   yes  Notes to clinic.  Medication was discontinued by Dr. Letta Pate on 06/03/2021.    Requested Prescriptions  Pending Prescriptions Disp Refills   DULoxetine (CYMBALTA) 30 MG capsule [Pharmacy Med Name: DULOXETINE DR 30MG  CAPSULES] 90 capsule 0    Sig: TAKE 1 CAPSULE(30 MG) BY MOUTH DAILY     Psychiatry: Antidepressants - SNRI Passed - 07/28/2021  5:33 PM      Passed - Last BP in normal range    BP Readings from Last 1 Encounters:  07/24/21 132/89          Passed - Valid encounter within last 6 months    Recent Outpatient Visits           1 week ago Essential hypertension   [facility] Juline Patch, MD   3 months ago Rotator cuff impingement syndrome, left   [facility] Montel Culver, MD   4 months ago Rotator cuff impingement syndrome, left   [facility] Montel Culver, MD   5 months ago Cervical spondylosis with radiculopathy   [facility] Montel Culver, MD   5 months ago Mass of soft tissue of upper arm   [facility] Montel Culver, MD       Future Appointments             In 2 months Juline Patch, MD [facility], [place]   In 5 months Juline Patch, MD [facility]

## 2021-07-30 NOTE — Telephone Encounter (Signed)
Refill if appropriate.  Please advise. Last visit 04/21/21.  Following with pain management.

## 2021-07-30 NOTE — Telephone Encounter (Signed)
Left message for patient to return call.

## 2021-08-01 ENCOUNTER — Other Ambulatory Visit: Payer: Self-pay | Admitting: Registered Nurse

## 2021-08-05 ENCOUNTER — Other Ambulatory Visit: Payer: Self-pay

## 2021-08-05 ENCOUNTER — Encounter
Payer: BC Managed Care – PPO | Attending: Physical Medicine and Rehabilitation | Admitting: Physical Medicine & Rehabilitation

## 2021-08-05 ENCOUNTER — Encounter: Payer: Self-pay | Admitting: Physical Medicine & Rehabilitation

## 2021-08-05 VITALS — BP 116/77 | HR 101 | Temp 98.2°F | Ht 63.0 in | Wt 135.0 lb

## 2021-08-05 DIAGNOSIS — T451X5A Adverse effect of antineoplastic and immunosuppressive drugs, initial encounter: Secondary | ICD-10-CM | POA: Insufficient documentation

## 2021-08-05 DIAGNOSIS — R202 Paresthesia of skin: Secondary | ICD-10-CM | POA: Diagnosis not present

## 2021-08-05 DIAGNOSIS — R200 Anesthesia of skin: Secondary | ICD-10-CM | POA: Diagnosis not present

## 2021-08-05 DIAGNOSIS — Z5181 Encounter for therapeutic drug level monitoring: Secondary | ICD-10-CM | POA: Insufficient documentation

## 2021-08-05 DIAGNOSIS — M961 Postlaminectomy syndrome, not elsewhere classified: Secondary | ICD-10-CM | POA: Insufficient documentation

## 2021-08-05 DIAGNOSIS — G620 Drug-induced polyneuropathy: Secondary | ICD-10-CM | POA: Insufficient documentation

## 2021-08-05 DIAGNOSIS — G894 Chronic pain syndrome: Secondary | ICD-10-CM | POA: Insufficient documentation

## 2021-08-05 DIAGNOSIS — M5416 Radiculopathy, lumbar region: Secondary | ICD-10-CM | POA: Diagnosis not present

## 2021-08-05 DIAGNOSIS — Z79891 Long term (current) use of opiate analgesic: Secondary | ICD-10-CM | POA: Insufficient documentation

## 2021-08-05 NOTE — Progress Notes (Signed)
46 year old female with history of left-sided neck pain radiating to the left upper extremity. MRI performed on 04/22/2021 showed disc protrusions contacting the ventral cord at C5-6 and C6-C7 primarily on the right side.  Foraminal impingement C4-5 C5-6 right side.  Some moderate left foraminal impingement C5-6 on the left side.  She also has a history of rotator cuff issues left shoulder. She complains of hand numbness affecting the entire left hand most notable in the mornings.  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 Prelim no evidence of focal compression neuropathy at the wrists or at the elbows. No electrodiagnostic evidence of cervical radiculopathy however pure sensory neuropathy without motor involvement may have normal EMG findings.  Please see full report under media tab

## 2021-08-05 NOTE — Patient Instructions (Signed)
EMG prelim looks normal , no evidence of carpal tunnel entrapment

## 2021-08-06 ENCOUNTER — Other Ambulatory Visit: Payer: Self-pay

## 2021-08-06 ENCOUNTER — Ambulatory Visit (HOSPITAL_BASED_OUTPATIENT_CLINIC_OR_DEPARTMENT_OTHER): Payer: BC Managed Care – PPO | Admitting: Student in an Organized Health Care Education/Training Program

## 2021-08-06 ENCOUNTER — Encounter: Payer: Self-pay | Admitting: Student in an Organized Health Care Education/Training Program

## 2021-08-06 ENCOUNTER — Ambulatory Visit
Admission: RE | Admit: 2021-08-06 | Discharge: 2021-08-06 | Disposition: A | Discharge status: Home / Self Care | Payer: BC Managed Care – PPO | Source: Ambulatory Visit | Attending: Student in an Organized Health Care Education/Training Program | Admitting: Student in an Organized Health Care Education/Training Program

## 2021-08-06 DIAGNOSIS — M4802 Spinal stenosis, cervical region: Secondary | ICD-10-CM | POA: Diagnosis not present

## 2021-08-06 DIAGNOSIS — M5412 Radiculopathy, cervical region: Secondary | ICD-10-CM

## 2021-08-06 DIAGNOSIS — G894 Chronic pain syndrome: Secondary | ICD-10-CM

## 2021-08-06 MED ORDER — DEXAMETHASONE SODIUM PHOSPHATE 10 MG/ML IJ SOLN
10.0000 mg | Freq: Once | INTRAMUSCULAR | Status: AC
  Administered 2021-08-06: 10 mg

## 2021-08-06 MED ORDER — ROPIVACAINE HCL 2 MG/ML IJ SOLN
INTRAMUSCULAR | Status: AC
  Filled 2021-08-06: qty 20

## 2021-08-06 MED ORDER — LIDOCAINE HCL 2 % IJ SOLN
INTRAMUSCULAR | Status: AC
  Filled 2021-08-06: qty 10

## 2021-08-06 MED ORDER — DEXAMETHASONE SODIUM PHOSPHATE 10 MG/ML IJ SOLN
INTRAMUSCULAR | Status: AC
  Filled 2021-08-06: qty 1

## 2021-08-06 MED ORDER — LIDOCAINE HCL 2 % IJ SOLN
20.0000 mL | Freq: Once | INTRAMUSCULAR | Status: AC
  Administered 2021-08-06: 200 mg

## 2021-08-06 MED ORDER — IOHEXOL 180 MG/ML  SOLN
10.0000 mL | Freq: Once | INTRAMUSCULAR | Status: AC
  Administered 2021-08-06: 5 mL via EPIDURAL

## 2021-08-06 MED ORDER — SODIUM CHLORIDE (PF) 0.9 % IJ SOLN
INTRAMUSCULAR | Status: AC
  Filled 2021-08-06: qty 10

## 2021-08-06 MED ORDER — ROPIVACAINE HCL 2 MG/ML IJ SOLN
1.0000 mL | Freq: Once | INTRAMUSCULAR | Status: AC
  Administered 2021-08-06: 1 mL via EPIDURAL

## 2021-08-06 MED ORDER — SODIUM CHLORIDE 0.9% FLUSH
1.0000 mL | Freq: Once | INTRAVENOUS | Status: AC
  Administered 2021-08-06: 1 mL

## 2021-08-06 NOTE — Patient Instructions (Signed)

## 2021-08-06 NOTE — Progress Notes (Signed)
Safety precautions to be maintained throughout the outpatient stay will include: orient to surroundings, keep bed in low position, maintain call bell within reach at all times, provide assistance with transfer out of bed and ambulation.  

## 2021-08-06 NOTE — Progress Notes (Signed)
PROVIDER NOTE: Interpretation of information contained herein should be left to medically-trained personnel. Specific patient instructions are provided elsewhere under "Patient Instructions" section of medical record. This document was created in part using STT-dictation technology, any transcriptional errors that may result from this process are unintentional.  Patient: Elizabeth Torres Type: Established DOB: 10/27/1975 MRN: [id number] PCP: Juline Patch, MD  Service: Procedure DOS: 08/06/2021 Setting: Ambulatory Location: Ambulatory outpatient facility Delivery: Face-to-face Provider: Gillis Santa, MD Specialty: Interventional Pain Management Specialty designation: 09 Location: Outpatient facility Ref. Prov.: Gillis Santa, MD   Procedure [facility] Interventional Pain Management )   Interlaminar Cervical Epidural Steroid injection (ESI) #1  Laterality: Midline Level: C7-T1 Imaging: Fluoroscopy-assisted DOS: 08/06/2021 Performed by: Gillis Santa, MD Anesthesia: Local anesthesia (1-2% Lidocaine) Anxiolysis: None Sedation: None.   Purpose: Diagnostic/Therapeutic Indications: Cervicalgia, cervical radicular pain, degenerative disc disease, severe enough to impact quality of life or function. 1. Cervical radicular pain   2. Spinal stenosis in cervical region   3. Chronic pain syndrome    NAS-11 score:   Pre-procedure: 7 /10   Post-procedure: 3 /10     Pre-Procedure Preparation  Monitoring: As per clinic protocol. Respiration, ETCO2, SpO2, BP, heart rate and rhythm monitor placed and checked for adequate function  Risk Assessment: Vitals:  IOX:[id number] body mass index is 23.91 kg/m as calculated from the following:   Height as of this encounter: 5\' 3"  (1.6 m).   Weight as of this encounter: 135 lb (61.2 kg)., Rate:89ECG Heart Rate: 98, BP:(!) 142/81, Resp:18, Temp:(!) 97.2 F (36.2 C), SpO2:99 %  Allergies: She is allergic to penicillins, pregabalin, sulfa antibiotics, ondansetron,  other, and zofran [ondansetron hcl].  Precautions: None required  Blood-thinner(s): None at this time  Coagulopathies: Reviewed. None identified.   Active Infection(s): Reviewed. None identified. [name] is afebrile   Location setting: Procedure suite Position: Prone, on modified reverse trendelenburg to facilitate breathing, with head in head-cradle. Pillows positioned under chest (below chin-level) with cervical spine flexed. Safety Precautions: Patient was assessed for positional comfort and pressure points before starting the procedure. Prepping solution: DuraPrep (Iodine Povacrylex [0.7% available iodine] and Isopropyl Alcohol, 74% w/w) Prep Area: Entire  cervicothoracic region Approach: percutaneous, paramedial Intended target: Posterior cervical epidural space Materials: Tray: Epidural Needle(s): Epidural (Tuohy) Qty: 1 Length: (27mm) 3.5-inch Gauge: 22G   Meds ordered this encounter  Medications   lidocaine (XYLOCAINE) 2 % (with pres) injection 400 mg   sodium chloride flush (NS) 0.9 % injection 1 mL   ropivacaine (PF) 2 mg/mL (0.2%) (NAROPIN) injection 1 mL   dexamethasone (DECADRON) injection 10 mg   iohexol (OMNIPAQUE) 180 MG/ML injection 10 mL    Must be Myelogram-compatible. If not available, you may substitute with a water-soluble, non-ionic, hypoallergenic, myelogram-compatible radiological contrast medium.   3 cc solution made of 1 cc of preservative-free saline, 1 cc of 0.2% ropivacaine, 1 cc of Decadron 10 mg/cc.   Orders Placed This Encounter  Procedures   DG PAIN CLINIC C-ARM 1-60 MIN NO REPORT    Intraoperative interpretation by procedural physician at [facility].    Standing Status:   Standing    Number of Occurrences:   1    Order Specific Question:   Reason for exam:    Answer:   Assistance in needle guidance and placement for procedures requiring needle placement in or near specific anatomical locations not easily accessible without such  assistance.     Time-out: [phone] I initiated and conducted the "Time-out" before starting the  procedure, as per protocol. The patient was asked to participate by confirming the accuracy of the "Time Out" information. Verification of the correct person, site, and procedure were performed and confirmed by me, the nursing staff, and the patient. "Time-out" conducted as per Joint Commission's Universal Protocol (UP.01.01.01). Procedure checklist: Completed   H&P (Pre-op  Assessment)  Elizabeth Torres is a 46 y.o. (year old), female patient, seen today for interventional treatment. She  has a past surgical history that includes Spine surgery (2008); mastectomy (Right); Breast surgery; Port a cath revision; Port-a-cath removal (11-07-15); Breast lumpectomy (Right, 08/2011); Breast biopsy (Right, 07/2011); Tubal ligation; and Shoulder arthroscopy with rotator cuff repair (Left, 05/18/2019). Elizabeth Torres has a current medication list which includes the following prescription(s): hydrochlorothiazide, hydrocodone-acetaminophen, ibuprofen, methocarbamol, nortriptyline, and zolpidem. Her primarily concern today is the Neck Pain  She is allergic to penicillins, pregabalin, sulfa antibiotics, ondansetron, other, and zofran [ondansetron hcl].   Last encounter: My last encounter with her was on 07/24/2021. Pertinent problems: Ms. Held does not have any pertinent problems on file. Pain Assessment: Severity of Chronic pain is reported as a 7 /10. Location: Neck  /shoulders and arms, left is worse. Onset: More than a month ago. Quality: Constant, Sharp. Timing: Constant. Modifying factor(s): heat, meds, rest. Vitals:  height is 5\' 3"  (1.6 m) and weight is 135 lb (61.2 kg). Her temporal temperature is 97.2 F (36.2 C) (abnormal). Her blood pressure is 133/83 and her pulse is 89. Her respiration is 22 (abnormal) and oxygen saturation is 100%.   Reason for encounter: Interventional pain management therapy due pain of at least four (4) weeks  in duration, with to failure to respond to and/or inability to tolerate more conservative care.   Related imaging: Cervical MR wo contrast:  Results for orders placed during the hospital encounter of 04/29/21  MR Cervical Spine Wo Contrast  Narrative CLINICAL DATA:  Neck pain for 6 months with bilateral arm pain and hand numbness  EXAM: MRI CERVICAL SPINE WITHOUT CONTRAST  TECHNIQUE: Multiplanar, multisequence MR imaging of the cervical spine was performed. No intravenous contrast was administered.  COMPARISON:  Cervical myelogram 05/09/2010  FINDINGS: Alignment: Reversal of cervical lordosis  Vertebrae: No fracture, evidence of discitis, or bone lesion.  Cord: Normal signal and morphology.  Posterior Fossa, vertebral arteries, paraspinal tissues: Negative.  Disc levels:  C2-3: Unremarkable.  C3-4: Unremarkable.  C4-5: Disc narrowing and right eccentric bulging with right uncovertebral ridging. Moderate right foraminal impingement  C5-6: Disc narrowing and bulging with broad protrusion. Bilateral uncovertebral spurring asymmetric to the right. Spinal stenosis that causes slight ventral cord indentation. Right more than left foraminal impingement  C6-7: Mild right uncovertebral ridging. Disc narrowing and bulging with shallow right paracentral protrusion contacting the ventral cord. Patent foramina  C7-T1:Unremarkable.  IMPRESSION: 1. Disc degeneration with reversal of cervical lordosis that has progressed from a 2011 myelogram. 2. Disc protrusions contact the ventral cord at C5-6 and C6-7. Diffusely patent spinal canal. 3. Right foraminal impingement at C4-5 and especially C5-6. 4. Moderate left foraminal impingement at C5-6.   Electronically Signed By: Jorje Guild M.D. On: 04/30/2021 07:49   Narrative CLINICAL DATA:  Right-sided neck and arm pain for 9 days. No known injury.  EXAM: CERVICAL SPINE - 2-3 VIEW  COMPARISON:   None.  FINDINGS: There is no evidence of cervical spine fracture or prevertebral soft tissue swelling. Alignment is normal.  Moderate degenerative disc disease and uncovertebral spurring is seen at C5-6. Other intervertebral disc spaces are  maintained. Mild cervical kyphosis also noted. No other bone abnormality identified.  IMPRESSION: No acute findings.  Moderate degenerative disc disease and uncovertebral spurring at C5-6. Mild cervical kyphosis.   Electronically Signed By: Earle Gell M.D. On: 12/18/2015 14:40  Cervical DG F/E views:    Narrative CLINICAL DATA:  LEFT UPPER extremity deformity. Suspect biceps tendon tear. RIGHT neck pain radiates into the RIGHT trapezius. Obvious deformity anterior distal humerus about the elbow, chronic.  EXAM: CERVICAL SPINE - COMPLETE 4+ VIEW  COMPARISON:  12/18/2015  FINDINGS: There is loss of cervical lordosis. This may be secondary to splinting, soft tissue injury, or positioning. There is disc height loss at C5-6. There is 4 millimeters retrolisthesis of C5 on C diff, increased compared to prior study. Significant disc height loss and uncovertebral spurring also noted at C6-7. There is no acute fracture or subluxation. Prevertebral soft tissues are unremarkable. Lung apices are clear.  IMPRESSION: Mid cervical degenerative changes.  Loss of cervical lordosis.   Electronically Signed By: Nolon Nations M.D. On: 02/03/2021 13:28    Site Confirmation: Ms. Fakhouri was asked to confirm the procedure and laterality before marking the site.  Consent: Before the procedure and under the influence of no sedative(s), amnesic(s), or anxiolytics, the patient was informed of the treatment options, risks and possible complications. To fulfill our ethical and legal obligations, as recommended by the American Medical Association's Code of Ethics, I have informed the patient of my clinical impression; the nature and purpose of the  treatment or procedure; the risks, benefits, and possible complications of the intervention; the alternatives, including doing nothing; the risk(s) and benefit(s) of the alternative treatment(s) or procedure(s); and the risk(s) and benefit(s) of doing nothing. The patient was provided information about the general risks and possible complications associated with the procedure. These may include, but are not limited to: failure to achieve desired goals, infection, bleeding, organ or nerve damage, allergic reactions, paralysis, and death. In addition, the patient was informed of those risks and complications associated to Spine-related procedures, such as failure to decrease pain; infection (i.e.: Meningitis, epidural or intraspinal abscess); bleeding (i.e.: epidural hematoma, subarachnoid hemorrhage, or any other type of intraspinal or peri-dural bleeding); organ or nerve damage (i.e.: Any type of peripheral nerve, nerve root, or spinal cord injury) with subsequent damage to sensory, motor, and/or autonomic systems, resulting in permanent pain, numbness, and/or weakness of one or several areas of the body; allergic reactions; (i.e.: anaphylactic reaction); and/or death. Furthermore, the patient was informed of those risks and complications associated with the medications. These include, but are not limited to: allergic reactions (i.e.: anaphylactic or anaphylactoid reaction(s)); adrenal axis suppression; blood sugar elevation that in diabetics may result in ketoacidosis or comma; water retention that in patients with history of congestive heart failure may result in shortness of breath, pulmonary edema, and decompensation with resultant heart failure; weight gain; swelling or edema; medication-induced neural toxicity; particulate matter embolism and blood vessel occlusion with resultant organ, and/or nervous system infarction; and/or aseptic necrosis of one or more joints. Finally, the patient was informed that  Medicine is not an exact science; therefore, there is also the possibility of unforeseen or unpredictable risks and/or possible complications that may result in a catastrophic outcome. The patient indicated having understood very clearly. We have given the patient no guarantees and we have made no promises. Enough time was given to the patient to ask questions, all of which were answered to the patient's satisfaction. Ms. Amundson has indicated  that she wanted to continue with the procedure. Attestation: I, the ordering provider, attest that I have discussed with the patient the benefits, risks, side-effects, alternatives, likelihood of achieving goals, and potential problems during recovery for the procedure that I have provided informed consent.  Date   Time: 08/06/2021  9:16 AM   Prophylactic antibiotics  Anti-infectives (From admission, onward)    None      Indication(s): None identified   Description of procedure   Start Time: 0939 hrs  Local Anesthesia: Once the patient was positioned, prepped, and time-out was completed. The target area was identified located. The skin was marked with an approved surgical skin marker. Once marked, the skin (epidermis, dermis, and hypodermis), and deeper tissues (fat, connective tissue and muscle) were infiltrated with a small amount of a short-acting local anesthetic, loaded on a 10cc syringe with a 25G, 1.5-in  Needle. An appropriate amount of time was allowed for local anesthetics to take effect before proceeding to the next step. Local Anesthetic: Lidocaine 1-2% The unused portion of the local anesthetic was discarded in the proper designated containers. Safety Precautions: Aspiration looking for blood return was conducted prior to all injections. At no point did I inject any substances, as a needle was being advanced. Before injecting, the patient was told to immediately notify me if she was experiencing any new onset of "ringing in the ears, or metallic  taste in the mouth". No attempts were made at seeking any paresthesias. Safe injection practices and needle disposal techniques used. Medications properly checked for expiration dates. SDV (single dose vial) medications used. After the completion of the procedure, all disposable equipment used was discarded in the proper designated medical waste containers.  Technical description: Protocol guidelines were followed. Using fluoroscopic guidance, the epidural needle was introduced through the skin, ipsilateral to the reported pain, and advanced to the target area. Posterior laminar os was contacted and the needle walked caudad, until the lamina was cleared. The ligamentum flavum was engaged and the epidural space identified using loss-of-resistance technique with 2-3 ml of PF-NaCl (0.9% NSS), in a 5cc dedicated LOR syringe. See "Imaging guidance" below for use of contrast details.  Injection: Once satisfactory needle placement was confirmed, I proceeded to inject the desired solution in slow, incremental fashion, intermittently assessing for discomfort or any signs of abnormal or undesired spread of substance. Once completed, the needle was removed and disposed of, as per hospital protocols.   Vitals:   08/06/21 0920 08/06/21 0935 08/06/21 0940 08/06/21 0946  BP: (!) 142/81 120/85 119/89 133/83  Pulse: 89     Resp: 18 14 15  (!) 22  Temp: (!) 97.2 F (36.2 C)     TempSrc: Temporal     SpO2: 99% 100% 100% 100%  Weight: 135 lb (61.2 kg)     Height: 5\' 3"  (1.6 m)       End Time: 0944 hrs  Once the entire procedure was completed, the treated area was cleaned, making sure to leave some of the prepping solution back to take advantage of its long term bactericidal properties.   Imaging guidance  Type of Imaging Technique: Fluoroscopy Guidance (Spinal) Indication(s): Assistance in needle guidance and placement for procedures requiring needle placement in or near specific anatomical locations not  easily accessible without such assistance. Exposure Time: Please see nurses notes for exact fluoroscopy time. Contrast: Before injecting any contrast, we confirmed that the patient did not have an allergy to iodine, shellfish, or radiological contrast. Once satisfactory needle placement  was completed, radiological contrast was injected under continuous fluoroscopic guidance. Injection of contrast accomplished without complications. See chart for type and volume of contrast used. Fluoroscopic Guidance: I was personally present in the fluoroscopy suite, where the patient was placed in position for the procedure, over the fluoroscopy-compatible table. Fluoroscopy was manipulated, using "Tunnel Vision Technique", to obtain the best possible view of the target area, on the affected side. Parallax error was corrected before commencing the procedure. A "direction-depth-direction" technique was used to introduce the needle under continuous pulsed fluoroscopic guidance. Once the target was reached, antero-posterior, oblique, and lateral fluoroscopic projection views were taken to confirm needle placement in all planes. Electronic images uploaded into EMR.  Interpretation: Successful epidural injection. Intraoperative imaging interpretation by performing Physician.    Post-op assessment  Post-procedure Vital Signs:  Pulse/HCG Rate: 8996 Temp: (!) 97.2 F (36.2 C) Resp: (!) 22 BP: 133/83 SpO2: 100 %  EBL: None  Complications: No immediate post-treatment complications observed by team, or reported by patient.  Note: The patient tolerated the entire procedure well. A repeat set of vitals were taken after the procedure and the patient was kept under observation following institutional policy, for this type of procedure. Post-procedural neurological assessment was performed, showing return to baseline, prior to discharge. The patient was provided with post-procedure discharge instructions, including a section  on how to identify potential problems. Should any problems arise concerning this procedure, the patient was given instructions to immediately contact us, at any time, without hesitation. In any case, we plan to contact the patient by telephone for a follow-up status report regarding this interventional procedure.  Comments:  No additional relevant information.   5 out of 5 strength bilateral upper extremity: Shoulder abduction, elbow flexion, elbow extension, thumb extension.   Plan of care   Medications administered: We administered lidocaine, sodium chloride flush, ropivacaine (PF) 2 mg/mL (0.2%), dexamethasone, and iohexol.  Follow-up plan:   Return in about 4 weeks (around 09/03/2021) for Post Procedure Evaluation, virtual.     Recent Visits Date Type Provider Dept  07/24/21 Office Visit Gillis Santa, MD [facility]  Showing recent visits within past 90 days and meeting all other requirements Today's Visits Date Type Provider Dept  08/06/21 Procedure visit Gillis Santa, MD [facility]  Showing today's visits and meeting all other requirements Future Appointments Date Type Provider Dept  09/03/21 Appointment Gillis Santa, MD [facility]  Showing future appointments within next 90 days and meeting all other requirements   Disposition: Discharge home  Discharge (Date   Time): 08/06/2021; 0952 hrs.   Primary Care Physician: Juline Patch, MD Location: [facility] Note by: Gillis Santa, MD Date: 08/06/2021; Time: 10:11 AM  DISCLAIMER: Medicine is not an exact science. It has no guarantees or warranties. The decision to proceed with this intervention was based on the information collected from the patient. Conclusions were drawn from the patient's questionnaire, interview, and examination. Because information was provided in large part by the patient, it cannot be guaranteed that it has not been purposely or unconsciously  manipulated or altered. Every effort has been made to obtain as much accurate, relevant, available data as possible. Always take into account that the treatment will also be dependent on availability of resources and existing treatment guidelines, considered by other Pain Management Specialists as being common knowledge and practice, at the time of the intervention. It is also important to point out that variation in procedural techniques and pharmacological choices  are the acceptable norm. For Medico-Legal review purposes, the indications, contraindications, technique, and results of the these procedures should only be evaluated, judged and interpreted by a Board-Certified Interventional Pain Specialist with extensive familiarity and expertise in the same exact procedure and technique.

## 2021-08-07 ENCOUNTER — Telehealth: Payer: Self-pay

## 2021-08-07 NOTE — Telephone Encounter (Signed)
Post procedure phone call.  Patient states she is doing well.  

## 2021-08-27 ENCOUNTER — Other Ambulatory Visit: Payer: Self-pay

## 2021-08-27 ENCOUNTER — Encounter (HOSPITAL_BASED_OUTPATIENT_CLINIC_OR_DEPARTMENT_OTHER): Payer: BC Managed Care – PPO | Admitting: Registered Nurse

## 2021-08-27 ENCOUNTER — Encounter: Payer: Self-pay | Admitting: Registered Nurse

## 2021-08-27 VITALS — BP 118/86 | Temp 102.7°F | Ht 63.0 in | Wt 135.0 lb

## 2021-08-27 DIAGNOSIS — M5416 Radiculopathy, lumbar region: Secondary | ICD-10-CM | POA: Diagnosis not present

## 2021-08-27 DIAGNOSIS — M961 Postlaminectomy syndrome, not elsewhere classified: Secondary | ICD-10-CM | POA: Diagnosis not present

## 2021-08-27 DIAGNOSIS — G894 Chronic pain syndrome: Secondary | ICD-10-CM

## 2021-08-27 DIAGNOSIS — Z5181 Encounter for therapeutic drug level monitoring: Secondary | ICD-10-CM

## 2021-08-27 DIAGNOSIS — G620 Drug-induced polyneuropathy: Secondary | ICD-10-CM

## 2021-08-27 DIAGNOSIS — T451X5A Adverse effect of antineoplastic and immunosuppressive drugs, initial encounter: Secondary | ICD-10-CM

## 2021-08-27 DIAGNOSIS — Z79891 Long term (current) use of opiate analgesic: Secondary | ICD-10-CM

## 2021-08-27 MED ORDER — HYDROCODONE-ACETAMINOPHEN 10-325 MG PO TABS: 1.0000 | ORAL_TABLET | Freq: Every day | ORAL | 0 refills | Status: DC | PRN

## 2021-08-27 NOTE — Progress Notes (Addendum)
Subjective:    Patient ID: Elizabeth Torres, female    DOB: 1976/04/25, 46 y.o.   MRN: [id number]  HPI: Elizabeth Torres is a 46 y.o. female who called the office this morning reporting she was febrile and vomiting. She states she called her PCP, she was instructed to go to Urgent Care.  [name] asked about a video visit, her appointment was changed to a Video-Visit, we have  discussed the limitations of evaluation and management by telemedicine and the availability of in person appointments. [name] expressed understanding and agreed to proceed with My- Chart Video Visit. She states her pain is located in her lower back radiating into her bilateral lower extremities. She rates her pain 4. Her current exercise regime is walking   [name] Morphine equivalent is 48.33 MME.    Last Oral Swab was Performed on 06/24/2021, it was consistent.   Pain Inventory Average Pain 4 Pain Right Now 4 My pain is constant and aching  In the last 24 hours, has pain interfered with the following? General activity 0 Relation with others 0 Enjoyment of life 0 What TIME of day is your pain at its worst? varies Sleep (in general) Fair  Pain is worse with: inactivity and some activites Pain improves with: rest and medication Relief from Meds: 8  Family History  Problem Relation Age of Onset   Diabetes Mother    Hypertension Mother    Pulmonary fibrosis Mother    Cancer Paternal Uncle    Breast cancer Paternal Uncle        23'S   Breast cancer Paternal Aunt        34'S   Other Father        unknown medical history   Social History   Socioeconomic History   Marital status: Married    Spouse name: Lemma Torres   Number of children: 3   Years of education: Not on file   Highest education level: Not on file  Occupational History   Not on file  Tobacco Use   Smoking status: Former    Packs/day: 1.00    Years: 20.00    Pack years: 20.00    Types: Cigarettes    Quit date: 01/08/2020    Years since  quitting: 1.6   Smokeless tobacco: Never  Vaping Use   Vaping Use: Never used  Substance and Sexual Activity   Alcohol use: Not Currently   Drug use: Never   Sexual activity: Yes    Partners: Male  Other Topics Concern   Not on file  Social History Narrative   Not on file   Social Determinants of Health   Financial Resource Strain: Not on file  Food Insecurity: Not on file  Transportation Needs: Not on file  Physical Activity: Not on file  Stress: Not on file  Social Connections: Not on file   Past Surgical History:  Procedure Laterality Date   BREAST BIOPSY Right 07/2011   invasive ductal carcinoma   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 RAd and chemo tx   BREAST SURGERY     mastectomy Right    partial with lymph node dissection   PORT A CATH REVISION     PORT-A-CATH REMOVAL  11-07-15   Dr Elizabeth Torres   SHOULDER ARTHROSCOPY WITH ROTATOR CUFF REPAIR Left 05/18/2019   Procedure: SHOULDER ARTHROSCOPY WITH SUBACROMIAL DECOMPRESSION AND DISTAL CLAVICAL EXCISION, INTRAARTICULAR DEBRIDEMENT;  Surgeon: Elizabeth Sheehan, MD;  Location: [place];  Service: Orthopedics;  Laterality: Left;   SPINE SURGERY  2008   Dr Elizabeth Torres   TUBAL LIGATION     Past Surgical History:  Procedure Laterality Date   BREAST BIOPSY Right 07/2011   invasive ductal carcinoma   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 RAd and chemo tx   BREAST SURGERY     mastectomy Right    partial with lymph node dissection   PORT A CATH REVISION     PORT-A-CATH REMOVAL  11-07-15   Dr Elizabeth Torres   SHOULDER ARTHROSCOPY WITH ROTATOR CUFF REPAIR Left 05/18/2019   Procedure: SHOULDER ARTHROSCOPY WITH SUBACROMIAL DECOMPRESSION AND DISTAL CLAVICAL EXCISION, INTRAARTICULAR DEBRIDEMENT;  Surgeon: Elizabeth Sheehan, MD;  Location: [facility];  Service: Orthopedics;  Laterality: Left;   SPINE SURGERY  2008   Dr Elizabeth Torres   TUBAL  LIGATION     Past Medical History:  Diagnosis Date   BRCA negative    Breast cancer ([place]) 2013   RT LUMPECTOMY with chemo and rad tx   Degenerative disc disease, lumbar    Personal history of chemotherapy 2013   for breast ca   Personal history of radiation therapy 2013   F/U right breast cancer    Radiation 2013   BREAST CA   Status post chemotherapy 2013   BREAST CA   BP 118/86 Comment: pt reported virtual visit   Temp (!) 102.7 F (39.3 C) Comment: pt reported virtual visit   Ht '5\' 3"'  (1.6 m) Comment: pt reported virtual visit   Wt 135 lb (61.2 kg) Comment: pt reported virtual visit   BMI 23.91 kg/m   Opioid Risk Score:   Fall Risk Score:  `1  Depression screen PHQ 2/9  Depression screen [facility] 2/9 08/27/2021 08/06/2021 08/05/2021 07/24/2021 07/22/2021 06/24/2021 06/03/2021  Decreased Interest 0 0 0 2 0 1 0  Down, Depressed, Hopeless 0 0 0 2 0 1 0  PHQ - 2 Score 0 0 0 4 0 2 0  Altered sleeping - - - 3 0 - -  Tired, decreased energy - - - 2 0 - -  Change in appetite - - - 0 0 - -  Feeling bad or failure about yourself  - - - 0 0 - -  Trouble concentrating - - - 2 0 - -  Moving slowly or fidgety/restless - - - 2 0 - -  Suicidal thoughts - - - 0 0 - -  PHQ-9 Score - - - 13 0 - -  Difficult doing work/chores - - - - - - -  Some recent data might be hidden     Review of Systems  Constitutional:  Positive for fever.  Gastrointestinal:  Positive for vomiting.  Musculoskeletal:  Positive for back pain.       Bilateral leg pain  Neurological:  Positive for headaches.  All other systems reviewed and are negative.     Objective:   Physical Exam Vitals and nursing note reviewed.  Cardiovascular:     Rate and Rhythm: Normal rate.  Pulmonary:     Effort: Pulmonary effort is normal.     Breath sounds: Normal breath sounds.  Musculoskeletal:     Comments: No Physical Exam Performed: My Chart Video Visit  Neurological:     Mental Status: She is oriented to person, place, and  time.  Psychiatric:        Mood and Affect: Mood normal.  Behavior: Behavior normal.         Assessment & Plan:  1. Lumbar postlaminectomy syndrome status post L5-S1 fusion with chronic S1 radiculopathy. Continue current medication regimen with Nortriptyline. 08/27/2021. Refilled :  Hydrocodone 10/325 mg #145-one every 6 hours as needed for pain, may take an extra tablet on work days only. 08/27/2021. We will continue the opioid monitoring program, this consists of regular clinic visits, examinations, urine drug screen, pill counts as well as use of [place] Controlled Substance Reporting system. A 12 month History has been reviewed on the [place] on 08/27/2021. 2. 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 08/27/2021 3. Insomnia: Continue current medication regimen Ambien. 08/27/2021  4.Chronic Left Shoulder Pain: No complaints today: S/P Left Shoulder Arthroscopy with Rotator Cuff Repair on 05/18/2019 by Dr. Gunnar Fusi : Ortho Following. 08/27/2021 5. Cervicalgia/ Cervical Radiculitis: Continue Pamelor  08/27/2021 6. Febrile: She called her PCP: She was instructed to go to Urgent Care: She was encouraged to go to Urgent Care: She verbalizes understanding. Continue to Monitor.     F/U in 1 month. Established Patient  My-Chart Video Visit Location of Patient: In her Home Location of Provider: In the Office

## 2021-09-03 ENCOUNTER — Encounter: Payer: Self-pay | Admitting: Student in an Organized Health Care Education/Training Program

## 2021-09-03 ENCOUNTER — Ambulatory Visit
Payer: BC Managed Care – PPO | Attending: Student in an Organized Health Care Education/Training Program | Admitting: Student in an Organized Health Care Education/Training Program

## 2021-09-03 ENCOUNTER — Other Ambulatory Visit: Payer: Self-pay

## 2021-09-03 DIAGNOSIS — M4802 Spinal stenosis, cervical region: Secondary | ICD-10-CM

## 2021-09-03 DIAGNOSIS — M961 Postlaminectomy syndrome, not elsewhere classified: Secondary | ICD-10-CM

## 2021-09-03 DIAGNOSIS — G894 Chronic pain syndrome: Secondary | ICD-10-CM

## 2021-09-03 DIAGNOSIS — M4722 Other spondylosis with radiculopathy, cervical region: Secondary | ICD-10-CM | POA: Diagnosis not present

## 2021-09-03 DIAGNOSIS — T451X5A Adverse effect of antineoplastic and immunosuppressive drugs, initial encounter: Secondary | ICD-10-CM

## 2021-09-03 DIAGNOSIS — G620 Drug-induced polyneuropathy: Secondary | ICD-10-CM | POA: Diagnosis not present

## 2021-09-03 DIAGNOSIS — M5412 Radiculopathy, cervical region: Secondary | ICD-10-CM

## 2021-09-03 NOTE — Progress Notes (Signed)
Patient: Elizabeth Torres  Service Category: E/M  Provider: Gillis Santa, MD  DOB: September 09, 1975  DOS: 09/03/2021  Location: Office  MRN: [id number]  Setting: Ambulatory outpatient  Referring Provider: Juline Patch, MD  Type: Established Patient  Specialty: Interventional Pain Management  PCP: Juline Patch, MD  Location: Remote location  Delivery: TeleHealth     Virtual Encounter - Pain Management PROVIDER NOTE: Information contained herein reflects review and annotations entered in association with encounter. Interpretation of such information and data should be left to medically-trained personnel. Information provided to patient can be located elsewhere in the medical record under "Patient Instructions". Document created using STT-dictation technology, any transcriptional errors that may result from process are unintentional.    Contact & Pharmacy Preferred: [phone] Home: [phone] (home) Mobile: [phone] (mobile) E-mail: [email or web address] [phone] [address], [place] [address] AT [address] [place] [address] Phone: [phone] Fax: [phone]   Pre-screening  Elizabeth Torres offered "in-person" vs "virtual" encounter. She indicated preferring virtual for this encounter.   Reason COVID-19*   Social distancing based on CDC and AMA recommendations.   I contacted Elizabeth Torres on 09/03/2021 via telephone.      I clearly identified myself as Gillis Santa, MD. I verified that I was speaking with the correct person using two identifiers (Name: Elizabeth Torres, and date of birth: 1975/12/24).  Consent I sought verbal advanced consent from Elizabeth Torres for virtual visit interactions. I informed Elizabeth Torres of possible security and privacy concerns, risks, and limitations associated with providing "not-in-person" medical evaluation and management services. I also informed Elizabeth Torres of the availability of "in-person" appointments.  Finally, I informed her that there would be a charge for the virtual visit and that she could be  personally, fully or partially, financially responsible for it. Ms. Cambre expressed understanding and agreed to proceed.   Historic Elements   Elizabeth Torres is a 46 y.o. year old, female patient evaluated today after our last contact on 08/06/2021. Elizabeth Torres  has a past medical history of BRCA negative, Breast cancer ([place]) (2013), Degenerative disc disease, lumbar, Personal history of chemotherapy (2013), Personal history of radiation therapy (2013), Radiation (2013), and Status post chemotherapy (2013). She also  has a past surgical history that includes Spine surgery (2008); mastectomy (Right); Breast surgery; Port a cath revision; Port-a-cath removal (11-07-15); Breast lumpectomy (Right, 08/2011); Breast biopsy (Right, 07/2011); Tubal ligation; and Shoulder arthroscopy with rotator cuff repair (Left, 05/18/2019). Elizabeth Torres has a current medication list which includes the following prescription(s): hydrochlorothiazide, hydrocodone-acetaminophen, ibuprofen, methocarbamol, nortriptyline, and zolpidem. She  reports that she quit smoking about 19 months ago. Her smoking use included cigarettes. She has a 20.00 pack-year smoking history. She has never used smokeless tobacco. She reports that she does not currently use alcohol. She reports that she does not use drugs. Elizabeth Torres is allergic to penicillins, pregabalin, sulfa antibiotics, ondansetron, other, and zofran [ondansetron hcl].   HPI  Today, she is being contacted for a post-procedure assessment.   Post-procedure evaluation    Interlaminar Cervical Epidural Steroid injection (ESI) #1  Laterality: Midline Level: C7-T1 Imaging: Fluoroscopy-assisted DOS: 08/06/2021 Performed by: Gillis Santa, MD Anesthesia: Local anesthesia (1-2% Lidocaine) Anxiolysis: None Sedation: None.   Purpose: Diagnostic/Therapeutic Indications: Cervicalgia, cervical radicular pain,  degenerative disc disease, severe enough to impact quality of life or function. 1. Cervical radicular pain   2. Spinal  stenosis in cervical region   3. Chronic pain syndrome    NAS-11 score:   Pre-procedure: 7 /10   Post-procedure: 3 /10     Effectiveness:  Initial hour after procedure: 100 %  Subsequent 4-6 hours post-procedure: 100 %  Analgesia past initial 6 hours: 100 % (ongoing)  Ongoing improvement:  Analgesic:  100% Function: Elizabeth Torres reports improvement in function ROM: Elizabeth Torres reports improvement in ROM     Laboratory Chemistry Profile   Renal Lab Results  Component Value Date   BUN 15 07/22/2021   CREATININE 0.78 07/22/2021   LABCREA 12.15 (L) 04/17/2015   BCR 19 07/22/2021   GFRAA 124 01/26/2020   GFRNONAA 107 01/26/2020    Hepatic Lab Results  Component Value Date   AST 23 01/26/2020   ALT 26 01/26/2020   ALBUMIN 4.2 07/22/2021   ALKPHOS 57 01/26/2020    Electrolytes Lab Results  Component Value Date   NA 139 07/22/2021   K 4.7 07/22/2021   CL 103 07/22/2021   CALCIUM 9.3 07/22/2021   PHOS 3.3 07/22/2021    Bone No results found for: VD25OH, VD125OH2TOT, YB0175ZW2, HE5277OE4, 25OHVITD1, 25OHVITD2, 25OHVITD3, TESTOFREE, TESTOSTERONE  Inflammation (CRP: Acute Phase) (ESR: Chronic Phase) Lab Results  Component Value Date   ESRSEDRATE 15 01/06/2010         Note: Above Lab results reviewed.   Assessment  The primary encounter diagnosis was Cervical radicular pain. Diagnoses of Spinal stenosis in cervical region, Chemotherapy-induced neuropathy ([place]), Cervical spondylosis with radiculopathy, Postlaminectomy syndrome, lumbar region, and Chronic pain syndrome were also pertinent to this visit.  Plan of Care   Significant analgesic and functional improvement after cervical epidural steroid injection performed on 08/06/2021.  Patient is very pleased with results and is endorsing approximately 100% pain relief.  Follow-up as needed for repeat injection  if she has return of symptoms.  Orders Placed This Encounter  Procedures   Cervical Epidural Injection    Sedation: Patient's choice. Purpose: Diagnostic/Therapeutic Indication(s): Radiculitis and cervicalgia associater with cervical degenerative disc disease.    Standing Status:   Standing    Number of Occurrences:   2    Standing Expiration Date:   03/03/2022    Scheduling Instructions:     Procedure: Cervical Epidural Steroid Injection/Block     Level(s): C7-T1     Laterality: TBD     Timeframe: As soon as schedule allows    Order Specific Question:   Where will this procedure be performed?    Answer:   [facility]    Comments:   Jibril Mcminn      Follow-up plan:   Return for PRN for C-ESI.    Recent Visits Date Type Provider Dept  08/06/21 Procedure visit Gillis Santa, MD [facility]  07/24/21 Office Visit Gillis Santa, MD [facility]  Showing recent visits within past 90 days and meeting all other requirements Today's Visits Date Type Provider Dept  09/03/21 Office Visit Gillis Santa, MD [facility]  Showing today's visits and meeting all other requirements Future Appointments No visits were found meeting these conditions. Showing future appointments within next 90 days and meeting all other requirements  I discussed the assessment and treatment plan with the patient. The patient was provided an opportunity to ask questions and all were answered. The patient agreed with the plan and demonstrated an understanding of the instructions.  Patient advised to call back or seek an in-person evaluation if the symptoms or condition worsens.  Duration of encounter: 24mnutes.  Note by: BGillis Santa MD Date: 09/03/2021; Time: 3:48 PM

## 2021-09-17 DIAGNOSIS — L650 Telogen effluvium: Secondary | ICD-10-CM | POA: Diagnosis not present

## 2021-09-17 DIAGNOSIS — L670 Trichorrhexis nodosa: Secondary | ICD-10-CM | POA: Diagnosis not present

## 2021-09-17 DIAGNOSIS — L648 Other androgenic alopecia: Secondary | ICD-10-CM | POA: Diagnosis not present

## 2021-09-21 ENCOUNTER — Other Ambulatory Visit: Payer: Self-pay | Admitting: Physical Medicine & Rehabilitation

## 2021-09-22 DIAGNOSIS — E559 Vitamin D deficiency, unspecified: Secondary | ICD-10-CM | POA: Diagnosis not present

## 2021-09-23 ENCOUNTER — Encounter: Payer: Self-pay | Admitting: Registered Nurse

## 2021-09-23 ENCOUNTER — Other Ambulatory Visit: Payer: Self-pay

## 2021-09-23 ENCOUNTER — Encounter: Payer: BC Managed Care – PPO | Attending: Physical Medicine and Rehabilitation | Admitting: Registered Nurse

## 2021-09-23 VITALS — BP 126/86 | HR 94 | Temp 98.2°F | Ht 63.0 in | Wt 135.6 lb

## 2021-09-23 DIAGNOSIS — G620 Drug-induced polyneuropathy: Secondary | ICD-10-CM | POA: Diagnosis not present

## 2021-09-23 DIAGNOSIS — Z5181 Encounter for therapeutic drug level monitoring: Secondary | ICD-10-CM | POA: Insufficient documentation

## 2021-09-23 DIAGNOSIS — G4700 Insomnia, unspecified: Secondary | ICD-10-CM | POA: Insufficient documentation

## 2021-09-23 DIAGNOSIS — M542 Cervicalgia: Secondary | ICD-10-CM | POA: Diagnosis not present

## 2021-09-23 DIAGNOSIS — Z79891 Long term (current) use of opiate analgesic: Secondary | ICD-10-CM | POA: Insufficient documentation

## 2021-09-23 DIAGNOSIS — M5412 Radiculopathy, cervical region: Secondary | ICD-10-CM | POA: Diagnosis not present

## 2021-09-23 DIAGNOSIS — T451X5A Adverse effect of antineoplastic and immunosuppressive drugs, initial encounter: Secondary | ICD-10-CM | POA: Insufficient documentation

## 2021-09-23 DIAGNOSIS — M961 Postlaminectomy syndrome, not elsewhere classified: Secondary | ICD-10-CM | POA: Insufficient documentation

## 2021-09-23 DIAGNOSIS — G894 Chronic pain syndrome: Secondary | ICD-10-CM | POA: Insufficient documentation

## 2021-09-23 DIAGNOSIS — M5416 Radiculopathy, lumbar region: Secondary | ICD-10-CM | POA: Insufficient documentation

## 2021-09-23 MED ORDER — HYDROCODONE-ACETAMINOPHEN 10-325 MG PO TABS: 1.0000 | ORAL_TABLET | Freq: Every day | ORAL | 0 refills | Status: DC | PRN

## 2021-09-23 NOTE — Progress Notes (Signed)
Subjective:    Patient ID: Elizabeth Torres, female    DOB: [birth year], 46 y.o.   MRN: [id number]  HPI: WILBA MUTZ is a 46 y.o. female who returns for follow up appointment for chronic pain and medication refill. She states her  pain is located in her neck radiating into her bilateral shoulders and lower back pain radiating into her bilateral lower extremities. She rates her pain 5. Her current exercise regime is walking and performing stretching exercises.  Ms Aslinger Morphine equivalent is 50.00 MME.   Oral Swab was Performed today.      Pain Inventory Average Pain 5 Pain Right Now 5 My pain is constant and stabbing  In the last 24 hours, has pain interfered with the following? General activity 3 Relation with others 2 Enjoyment of life 2 What TIME of day is your pain at its worst? daytime Sleep (in general) Poor  Pain is worse with: inactivity Pain improves with: medication and heat Relief from Meds: 4  Family History  Problem Relation Age of Onset   Diabetes Mother    Hypertension Mother    Pulmonary fibrosis Mother    Cancer Paternal Uncle    Breast cancer Paternal Uncle        56'S   Breast cancer Paternal Aunt        53'S   Other Father        unknown medical history   Social History   Socioeconomic History   Marital status: Married    Spouse name: Rochella Benner   Number of children: 3   Years of education: Not on file   Highest education level: Not on file  Occupational History   Not on file  Tobacco Use   Smoking status: Former    Packs/day: 1.00    Years: 20.00    Pack years: 20.00    Types: Cigarettes    Quit date: 01/08/2020    Years since quitting: 1.7   Smokeless tobacco: Never  Vaping Use   Vaping Use: Never used  Substance and Sexual Activity   Alcohol use: Not Currently   Drug use: Never   Sexual activity: Yes    Partners: Male  Other Topics Concern   Not on file  Social History Narrative   Not on file   Social Determinants of Health    Financial Resource Strain: Not on file  Food Insecurity: Not on file  Transportation Needs: Not on file  Physical Activity: Not on file  Stress: Not on file  Social Connections: Not on file   Past Surgical History:  Procedure Laterality Date   BREAST BIOPSY Right 07/2011   invasive ductal carcinoma   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 RAd and chemo tx   BREAST SURGERY     mastectomy Right    partial with lymph node dissection   PORT A CATH REVISION     PORT-A-CATH REMOVAL  11-07-15   Dr Bary Castilla   SHOULDER ARTHROSCOPY WITH ROTATOR CUFF REPAIR Left 05/18/2019   Procedure: SHOULDER ARTHROSCOPY WITH SUBACROMIAL DECOMPRESSION AND DISTAL CLAVICAL EXCISION, INTRAARTICULAR DEBRIDEMENT;  Surgeon: Lovell Sheehan, MD;  Location: [place];  Service: Orthopedics;  Laterality: Left;   SPINE SURGERY  2008   Dr Joya Salm   TUBAL LIGATION     Past Surgical History:  Procedure Laterality Date   BREAST BIOPSY Right 07/2011   invasive ductal carcinoma   BREAST LUMPECTOMY Right 08/2011  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 RAd and chemo tx   BREAST SURGERY     mastectomy Right    partial with lymph node dissection   PORT A CATH REVISION     PORT-A-CATH REMOVAL  11-07-15   Dr Bary Castilla   SHOULDER ARTHROSCOPY WITH ROTATOR CUFF REPAIR Left 05/18/2019   Procedure: SHOULDER ARTHROSCOPY WITH SUBACROMIAL DECOMPRESSION AND DISTAL CLAVICAL EXCISION, INTRAARTICULAR DEBRIDEMENT;  Surgeon: Lovell Sheehan, MD;  Location: [place];  Service: Orthopedics;  Laterality: Left;   SPINE SURGERY  2008   Dr Joya Salm   TUBAL LIGATION     Past Medical History:  Diagnosis Date   BRCA negative    Breast cancer ([place]) 2013   RT LUMPECTOMY with chemo and rad tx   Degenerative disc disease, lumbar    Personal history of chemotherapy 2013   for breast ca   Personal history of radiation therapy 2013   F/U right breast cancer     Radiation 2013   BREAST CA   Status post chemotherapy 2013   BREAST CA   BP 126/86    Pulse 94    Temp 98.2 F (36.8 C)    Ht '5\' 3"'  (1.6 m)    Wt 135 lb 9.6 oz (61.5 kg)    SpO2 96%    BMI 24.02 kg/m   Opioid Risk Score:   Fall Risk Score:  `1  Depression screen PHQ 2/9  Depression screen [facility] 2/9 08/27/2021 08/06/2021 08/05/2021 07/24/2021 07/22/2021 06/24/2021 06/03/2021  Decreased Interest 0 0 0 2 0 1 0  Down, Depressed, Hopeless 0 0 0 2 0 1 0  PHQ - 2 Score 0 0 0 4 0 2 0  Altered sleeping - - - 3 0 - -  Tired, decreased energy - - - 2 0 - -  Change in appetite - - - 0 0 - -  Feeling bad or failure about yourself  - - - 0 0 - -  Trouble concentrating - - - 2 0 - -  Moving slowly or fidgety/restless - - - 2 0 - -  Suicidal thoughts - - - 0 0 - -  PHQ-9 Score - - - 13 0 - -  Difficult doing work/chores - - - - - - -  Some recent data might be hidden    Review of Systems  Musculoskeletal:  Positive for back pain and neck pain.       Pain in both arms, both shoulders & both legs   All other systems reviewed and are negative.     Objective:   Physical Exam Vitals and nursing note reviewed.  Constitutional:      Appearance: Normal appearance.  Neck:     Comments: Cervical Paraspinal Tenderness: C-5-C-6  Cardiovascular:     Rate and Rhythm: Normal rate and regular rhythm.     Pulses: Normal pulses.     Heart sounds: Normal heart sounds.  Pulmonary:     Effort: Pulmonary effort is normal.     Breath sounds: Normal breath sounds.  Musculoskeletal:     Cervical back: Normal range of motion and neck supple.     Comments: Normal Muscle Bulk and Muscle Testing Reveals:  Upper Extremities: Decreased ROM 45 Degrees and Muscle Strength 5/5  Thoracic Hypersensitivity: T-1- T-4  Lower Extremities: Full ROM and Muscle Strength 5/5 Arises from Table with Ease Narrow Based Gait     Skin:    General: Skin is warm and dry.  Neurological:     Mental Status: She is alert and  oriented to person, place, and time.     Gait: Gait normal.  Psychiatric:        Mood and Affect: Mood normal.        Behavior: Behavior normal.         Assessment & Plan:  1. Lumbar postlaminectomy syndrome status post L5-S1 fusion with chronic S1 radiculopathy. Continue current medication regimen with Nortriptyline. 09/23/2021. Refilled :  Hydrocodone 10/325 mg #145-one every 6 hours as needed for pain, may take an extra tablet on work days only. 09/23/2021. We will continue the opioid monitoring program, this consists of regular clinic visits, examinations, urine drug screen, pill counts as well as use of [place] Controlled Substance Reporting system. A 12 month History has been reviewed on the [place] on 09/23/2021. 2. 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 09/23/2021 3. Insomnia: Continue current medication regimen Ambien. 09/23/2021  4.Chronic Left Shoulder Pain: No complaints today: S/P Left Shoulder Arthroscopy with Rotator Cuff Repair on 05/18/2019 by Dr. Gunnar Fusi : Ortho Following. 09/23/2021 5. Cervicalgia/ Cervical Radiculitis: Continue Pamelor Dr Holley Raring following.  09/23/2021   F/U in 1 month.

## 2021-09-25 ENCOUNTER — Telehealth: Payer: Self-pay | Admitting: Student in an Organized Health Care Education/Training Program

## 2021-09-25 ENCOUNTER — Encounter: Payer: Self-pay | Admitting: Student in an Organized Health Care Education/Training Program

## 2021-09-27 LAB — DRUG TOX MONITOR 1 W/CONF, ORAL FLD
Amphetamines: NEGATIVE ng/mL (ref <10)
Barbiturates: NEGATIVE ng/mL (ref <10)
Benzodiazepines: NEGATIVE ng/mL (ref <0.50)
Buprenorphine: NEGATIVE ng/mL (ref <0.10)
Cocaine: NEGATIVE ng/mL (ref <5.0)
Codeine: NEGATIVE ng/mL (ref <2.5)
Cotinine: >250 ng/mL — ABNORMAL HIGH (ref <5.0)
Dihydrocodeine: 29 ng/mL — ABNORMAL HIGH (ref <2.5)
Fentanyl: NEGATIVE ng/mL (ref <0.10)
Heroin Metabolite: NEGATIVE ng/mL (ref <1.0)
Hydrocodone: >250 ng/mL — ABNORMAL HIGH (ref <2.5)
Hydromorphone: NEGATIVE ng/mL (ref <2.5)
MARIJUANA: NEGATIVE ng/mL (ref <2.5)
MDMA: NEGATIVE ng/mL (ref <10)
Meprobamate: NEGATIVE ng/mL (ref <2.5)
Methadone: NEGATIVE ng/mL (ref <5.0)
Morphine: NEGATIVE ng/mL (ref <2.5)
Nicotine Metabolite: POSITIVE ng/mL — AB (ref <5.0)
Norhydrocodone: 44.4 ng/mL — ABNORMAL HIGH (ref <2.5)
Noroxycodone: NEGATIVE ng/mL (ref <2.5)
Opiates: POSITIVE ng/mL — AB (ref <2.5)
Oxycodone: NEGATIVE ng/mL (ref <2.5)
Oxymorphone: NEGATIVE ng/mL (ref <2.5)
Phencyclidine: NEGATIVE ng/mL (ref <10)
Tapentadol: NEGATIVE ng/mL (ref <5.0)
Tramadol: NEGATIVE ng/mL (ref <5.0)
Zolpidem: NEGATIVE ng/mL (ref <5.0)

## 2021-09-27 LAB — DRUG TOX ALC METAB W/CON, ORAL FLD: Alcohol Metabolite: NEGATIVE ng/mL (ref <25)

## 2021-09-29 ENCOUNTER — Ambulatory Visit
Admission: RE | Admit: 2021-09-29 | Discharge: 2021-09-29 | Disposition: A | Discharge status: Home / Self Care | Payer: BC Managed Care – PPO | Source: Ambulatory Visit | Attending: Student in an Organized Health Care Education/Training Program | Admitting: Student in an Organized Health Care Education/Training Program

## 2021-09-29 ENCOUNTER — Encounter: Payer: Self-pay | Admitting: Student in an Organized Health Care Education/Training Program

## 2021-09-29 ENCOUNTER — Other Ambulatory Visit: Payer: Self-pay

## 2021-09-29 ENCOUNTER — Ambulatory Visit (HOSPITAL_BASED_OUTPATIENT_CLINIC_OR_DEPARTMENT_OTHER): Payer: BC Managed Care – PPO | Admitting: Student in an Organized Health Care Education/Training Program

## 2021-09-29 DIAGNOSIS — M5412 Radiculopathy, cervical region: Secondary | ICD-10-CM | POA: Diagnosis not present

## 2021-09-29 DIAGNOSIS — M4802 Spinal stenosis, cervical region: Secondary | ICD-10-CM | POA: Insufficient documentation

## 2021-09-29 DIAGNOSIS — G894 Chronic pain syndrome: Secondary | ICD-10-CM

## 2021-09-29 MED ORDER — DEXAMETHASONE SODIUM PHOSPHATE 10 MG/ML IJ SOLN
10.0000 mg | Freq: Once | INTRAMUSCULAR | Status: AC
  Administered 2021-09-29: 10 mg
  Filled 2021-09-29: qty 1

## 2021-09-29 MED ORDER — LIDOCAINE HCL 2 % IJ SOLN
20.0000 mL | Freq: Once | INTRAMUSCULAR | Status: AC
  Administered 2021-09-29: 400 mg
  Filled 2021-09-29: qty 20

## 2021-09-29 MED ORDER — ROPIVACAINE HCL 2 MG/ML IJ SOLN
1.0000 mL | Freq: Once | INTRAMUSCULAR | Status: AC
  Administered 2021-09-29: 20 mL via EPIDURAL
  Filled 2021-09-29: qty 20

## 2021-09-29 MED ORDER — SODIUM CHLORIDE 0.9% FLUSH
1.0000 mL | Freq: Once | INTRAVENOUS | Status: AC
  Administered 2021-09-29: 10 mL

## 2021-09-29 MED ORDER — IOHEXOL 180 MG/ML  SOLN
10.0000 mL | Freq: Once | INTRAMUSCULAR | Status: AC
  Administered 2021-09-29: 10 mL via EPIDURAL
  Filled 2021-09-29: qty 20

## 2021-09-29 NOTE — Patient Instructions (Signed)

## 2021-09-29 NOTE — Progress Notes (Signed)
PROVIDER NOTE: Interpretation of information contained herein should be left to medically-trained personnel. Specific patient instructions are provided elsewhere under "Patient Instructions" section of medical record. This document was created in part using STT-dictation technology, any transcriptional errors that may result from this process are unintentional.  Patient: Elizabeth Torres Type: Established DOB: 20-Mar-1976 MRN: [id number] PCP: Juline Patch, MD  Service: Procedure DOS: 09/29/2021 Setting: Ambulatory Location: Ambulatory outpatient facility Delivery: Face-to-face Provider: Gillis Santa, MD Specialty: Interventional Pain Management Specialty designation: 09 Location: Outpatient facility Ref. Prov.: Gillis Santa, MD   Procedure [facility] Interventional Pain Management )   Interlaminar Cervical Epidural Steroid injection (ESI) #2 (#1 done 08/06/21) Laterality: Midline Level: C7-T1 Imaging: Fluoroscopy-assisted DOS: 09/29/2021 Performed by: Gillis Santa, MD Anesthesia: Local anesthesia (1-2% Lidocaine) Anxiolysis: None Sedation: None.   Purpose: Diagnostic/Therapeutic Indications: Cervicalgia, cervical radicular pain, degenerative disc disease, severe enough to impact quality of life or function. 1. Cervical radicular pain   2. Spinal stenosis in cervical region   3. Chronic pain syndrome    NAS-11 score:   Pre-procedure: 9 /10   Post-procedure: 0-No pain/10     Pre-Procedure Preparation  Monitoring: As per clinic protocol. Respiration, ETCO2, SpO2, BP, heart rate and rhythm monitor placed and checked for adequate function  Risk Assessment: Vitals:  [id number] body mass index is 23.91 kg/m as calculated from the following:   Height as of this encounter: 5\' 3"  (1.6 m).   Weight as of this encounter: 135 lb (61.2 kg)., Rate:81ECG Heart Rate: (!) 108, BP:118/84, Resp:16, Temp:(!) 97.2 F (36.2 C), SpO2:100 %  Allergies: She is allergic to penicillins, pregabalin, sulfa  antibiotics, ondansetron, other, and zofran [ondansetron hcl].  Precautions: None required  Blood-thinner(s): None at this time  Coagulopathies: Reviewed. None identified.   Active Infection(s): Reviewed. None identified. Elizabeth Torres is afebrile   Location setting: Procedure suite Position: Prone, on modified reverse trendelenburg to facilitate breathing, with head in head-cradle. Pillows positioned under chest (below chin-level) with cervical spine flexed. Safety Precautions: Patient was assessed for positional comfort and pressure points before starting the procedure. Prepping solution: DuraPrep (Iodine Povacrylex [0.7% available iodine] and Isopropyl Alcohol, 74% w/w) Prep Area: Entire  cervicothoracic region Approach: percutaneous, paramedial Intended target: Posterior cervical epidural space Materials: Tray: Epidural Needle(s): Epidural (Tuohy) Qty: 1 Length: (22mm) 3.5-inch Gauge: 22G   Meds ordered this encounter  Medications   iohexol (OMNIPAQUE) 180 MG/ML injection 10 mL    Must be Myelogram-compatible. If not available, you may substitute with a water-soluble, non-ionic, hypoallergenic, myelogram-compatible radiological contrast medium.   lidocaine (XYLOCAINE) 2 % (with pres) injection 400 mg   ropivacaine (PF) 2 mg/mL (0.2%) (NAROPIN) injection 1 mL   sodium chloride flush (NS) 0.9 % injection 1 mL   dexamethasone (DECADRON) injection 10 mg   4 cc solution made of 2 cc of preservative-free saline, 1 cc of 0.2% ropivacaine, 1 cc of Decadron 10 mg/cc.   Orders Placed This Encounter  Procedures   DG PAIN CLINIC C-ARM 1-60 MIN NO REPORT    Intraoperative interpretation by procedural physician at [facility].    Standing Status:   Standing    Number of Occurrences:   1    Order Specific Question:   Reason for exam:    Answer:   Assistance in needle guidance and placement for procedures requiring needle placement in or near specific anatomical locations not easily  accessible without such assistance.     Time-out: 1358 I initiated and conducted the "Time-out" before  starting the procedure, as per protocol. The patient was asked to participate by confirming the accuracy of the "Time Out" information. Verification of the correct person, site, and procedure were performed and confirmed by me, the nursing staff, and the patient. "Time-out" conducted as per Joint Commission's Universal Protocol (UP.01.01.01). Procedure checklist: Completed   H&P (Pre-op  Assessment)  Elizabeth Torres is a 46 y.o. (year old), female patient, seen today for interventional treatment. She  has a past surgical history that includes Spine surgery (2008); mastectomy (Right); Breast surgery; Port a cath revision; Port-a-cath removal (11-07-15); Breast lumpectomy (Right, 08/2011); Breast biopsy (Right, 07/2011); Tubal ligation; and Shoulder arthroscopy with rotator cuff repair (Left, 05/18/2019). Elizabeth Torres has a current medication list which includes the following prescription(s): hydrochlorothiazide, hydrocodone-acetaminophen, ibuprofen, methocarbamol, minoxidil, nortriptyline, and zolpidem. Her primarily concern today is the Neck Pain  She is allergic to penicillins, pregabalin, sulfa antibiotics, ondansetron, other, and zofran [ondansetron hcl].   Last encounter: My last encounter with her was on 07/24/2021. Pertinent problems: Elizabeth Torres does not have any pertinent problems on file. Pain Assessment: Severity of Chronic pain is reported as a 9 /10. Location: Neck Right, Left/pain radiaties down to her shoulder and arm, left is the worse. Onset: More than a month ago. Quality: Constant, Sharp. Timing: Constant. Modifying factor(s): heat, rest and meds. Vitals:  height is 5\' 3"  (1.6 m) and weight is 135 lb (61.2 kg). Her temperature is 97.2 F (36.2 C) (abnormal). Her blood pressure is 117/93 (abnormal) and her pulse is 81. Her respiration is 20 and oxygen saturation is 98%.   Reason for encounter:  Interventional pain management therapy due pain of at least four (4) weeks in duration, with to failure to respond to and/or inability to tolerate more conservative care.   Related imaging: Cervical MR wo contrast:  Results for orders placed during the hospital encounter of 04/29/21  MR Cervical Spine Wo Contrast  Narrative CLINICAL DATA:  Neck pain for 6 months with bilateral arm pain and hand numbness  EXAM: MRI CERVICAL SPINE WITHOUT CONTRAST  TECHNIQUE: Multiplanar, multisequence MR imaging of the cervical spine was performed. No intravenous contrast was administered.  COMPARISON:  Cervical myelogram 05/09/2010  FINDINGS: Alignment: Reversal of cervical lordosis  Vertebrae: No fracture, evidence of discitis, or bone lesion.  Cord: Normal signal and morphology.  Posterior Fossa, vertebral arteries, paraspinal tissues: Negative.  Disc levels:  C2-3: Unremarkable.  C3-4: Unremarkable.  C4-5: Disc narrowing and right eccentric bulging with right uncovertebral ridging. Moderate right foraminal impingement  C5-6: Disc narrowing and bulging with broad protrusion. Bilateral uncovertebral spurring asymmetric to the right. Spinal stenosis that causes slight ventral cord indentation. Right more than left foraminal impingement  C6-7: Mild right uncovertebral ridging. Disc narrowing and bulging with shallow right paracentral protrusion contacting the ventral cord. Patent foramina  C7-T1:Unremarkable.  IMPRESSION: 1. Disc degeneration with reversal of cervical lordosis that has progressed from a 2011 myelogram. 2. Disc protrusions contact the ventral cord at C5-6 and C6-7. Diffusely patent spinal canal. 3. Right foraminal impingement at C4-5 and especially C5-6. 4. Moderate left foraminal impingement at C5-6.   Electronically Signed By: Jorje Guild M.D. On: 04/30/2021 07:49   Narrative CLINICAL DATA:  Right-sided neck and arm pain for 9 days. No  known injury.  EXAM: CERVICAL SPINE - 2-3 VIEW  COMPARISON:  None.  FINDINGS: There is no evidence of cervical spine fracture or prevertebral soft tissue swelling. Alignment is normal.  Moderate degenerative disc disease and uncovertebral spurring  is seen at C5-6. Other intervertebral disc spaces are maintained. Mild cervical kyphosis also noted. No other bone abnormality identified.  IMPRESSION: No acute findings.  Moderate degenerative disc disease and uncovertebral spurring at C5-6. Mild cervical kyphosis.   Electronically Signed By: Earle Gell M.D. On: 12/18/2015 14:40  Cervical DG F/E views:    Narrative CLINICAL DATA:  LEFT UPPER extremity deformity. Suspect biceps tendon tear. RIGHT neck pain radiates into the RIGHT trapezius. Obvious deformity anterior distal humerus about the elbow, chronic.  EXAM: CERVICAL SPINE - COMPLETE 4+ VIEW  COMPARISON:  12/18/2015  FINDINGS: There is loss of cervical lordosis. This may be secondary to splinting, soft tissue injury, or positioning. There is disc height loss at C5-6. There is 4 millimeters retrolisthesis of C5 on C diff, increased compared to prior study. Significant disc height loss and uncovertebral spurring also noted at C6-7. There is no acute fracture or subluxation. Prevertebral soft tissues are unremarkable. Lung apices are clear.  IMPRESSION: Mid cervical degenerative changes.  Loss of cervical lordosis.   Electronically Signed By: Nolon Nations M.D. On: 02/03/2021 13:28    Site Confirmation: Ms. Dahle was asked to confirm the procedure and laterality before marking the site.  Consent: Before the procedure and under the influence of no sedative(s), amnesic(s), or anxiolytics, the patient was informed of the treatment options, risks and possible complications. To fulfill our ethical and legal obligations, as recommended by the American Medical Association's Code of Ethics, I have informed the  patient of my clinical impression; the nature and purpose of the treatment or procedure; the risks, benefits, and possible complications of the intervention; the alternatives, including doing nothing; the risk(s) and benefit(s) of the alternative treatment(s) or procedure(s); and the risk(s) and benefit(s) of doing nothing. The patient was provided information about the general risks and possible complications associated with the procedure. These may include, but are not limited to: failure to achieve desired goals, infection, bleeding, organ or nerve damage, allergic reactions, paralysis, and death. In addition, the patient was informed of those risks and complications associated to Spine-related procedures, such as failure to decrease pain; infection (i.e.: Meningitis, epidural or intraspinal abscess); bleeding (i.e.: epidural hematoma, subarachnoid hemorrhage, or any other type of intraspinal or peri-dural bleeding); organ or nerve damage (i.e.: Any type of peripheral nerve, nerve root, or spinal cord injury) with subsequent damage to sensory, motor, and/or autonomic systems, resulting in permanent pain, numbness, and/or weakness of one or several areas of the body; allergic reactions; (i.e.: anaphylactic reaction); and/or death. Furthermore, the patient was informed of those risks and complications associated with the medications. These include, but are not limited to: allergic reactions (i.e.: anaphylactic or anaphylactoid reaction(s)); adrenal axis suppression; blood sugar elevation that in diabetics may result in ketoacidosis or comma; water retention that in patients with history of congestive heart failure may result in shortness of breath, pulmonary edema, and decompensation with resultant heart failure; weight gain; swelling or edema; medication-induced neural toxicity; particulate matter embolism and blood vessel occlusion with resultant organ, and/or nervous system infarction; and/or aseptic necrosis  of one or more joints. Finally, the patient was informed that Medicine is not an exact science; therefore, there is also the possibility of unforeseen or unpredictable risks and/or possible complications that may result in a catastrophic outcome. The patient indicated having understood very clearly. We have given the patient no guarantees and we have made no promises. Enough time was given to the patient to ask questions, all of which were  answered to the patient's satisfaction. Ms. Hrivnak has indicated that she wanted to continue with the procedure. Attestation: I, the ordering provider, attest that I have discussed with the patient the benefits, risks, side-effects, alternatives, likelihood of achieving goals, and potential problems during recovery for the procedure that I have provided informed consent.  Date   Time: 09/29/2021 12:55 PM   Description of procedure   Start Time: 1358 hrs  Local Anesthesia: Once the patient was positioned, prepped, and time-out was completed. The target area was identified located. The skin was marked with an approved surgical skin marker. Once marked, the skin (epidermis, dermis, and hypodermis), and deeper tissues (fat, connective tissue and muscle) were infiltrated with a small amount of a short-acting local anesthetic, loaded on a 10cc syringe with a 25G, 1.5-in  Needle. An appropriate amount of time was allowed for local anesthetics to take effect before proceeding to the next step. Local Anesthetic: Lidocaine 1-2% The unused portion of the local anesthetic was discarded in the proper designated containers. Safety Precautions: Aspiration looking for blood return was conducted prior to all injections. At no point did I inject any substances, as a needle was being advanced. Before injecting, the patient was told to immediately notify me if she was experiencing any new onset of "ringing in the ears, or metallic taste in the mouth". No attempts were made at seeking any  paresthesias. Safe injection practices and needle disposal techniques used. Medications properly checked for expiration dates. SDV (single dose vial) medications used. After the completion of the procedure, all disposable equipment used was discarded in the proper designated medical waste containers.  Technical description: Protocol guidelines were followed. Using fluoroscopic guidance, the epidural needle was introduced through the skin, ipsilateral to the reported pain, and advanced to the target area. Posterior laminar os was contacted and the needle walked caudad, until the lamina was cleared. The ligamentum flavum was engaged and the epidural space identified using loss-of-resistance technique with 2-3 ml of PF-NaCl (0.9% NSS), in a 5cc dedicated LOR syringe. See "Imaging guidance" below for use of contrast details.  Injection: Once satisfactory needle placement was confirmed, I proceeded to inject the desired solution in slow, incremental fashion, intermittently assessing for discomfort or any signs of abnormal or undesired spread of substance. Once completed, the needle was removed and disposed of, as per hospital protocols.   Vitals:   09/29/21 1302 09/29/21 1400 09/29/21 1403  BP: 118/84 109/82 (!) 117/93  Pulse: 81    Resp: 16 16 20   Temp: (!) 97.2 F (36.2 C)    SpO2: 100% 98% 98%  Weight: 135 lb (61.2 kg)    Height: 5\' 3"  (1.6 m)      End Time: 1402 hrs  Once the entire procedure was completed, the treated area was cleaned, making sure to leave some of the prepping solution back to take advantage of its long term bactericidal properties.   Imaging guidance  Type of Imaging Technique: Fluoroscopy Guidance (Spinal) Indication(s): Assistance in needle guidance and placement for procedures requiring needle placement in or near specific anatomical locations not easily accessible without such assistance. Exposure Time: Please see nurses notes for exact fluoroscopy time. Contrast:  Before injecting any contrast, we confirmed that the patient did not have an allergy to iodine, shellfish, or radiological contrast. Once satisfactory needle placement was completed, radiological contrast was injected under continuous fluoroscopic guidance. Injection of contrast accomplished without complications. See chart for type and volume of contrast used. Fluoroscopic Guidance: I was  personally present in the fluoroscopy suite, where the patient was placed in position for the procedure, over the fluoroscopy-compatible table. Fluoroscopy was manipulated, using "Tunnel Vision Technique", to obtain the best possible view of the target area, on the affected side. Parallax error was corrected before commencing the procedure. A "direction-depth-direction" technique was used to introduce the needle under continuous pulsed fluoroscopic guidance. Once the target was reached, antero-posterior, oblique, and lateral fluoroscopic projection views were taken to confirm needle placement in all planes. Electronic images uploaded into EMR.  Interpretation: Successful epidural injection. Intraoperative imaging interpretation by performing Physician.    Post-op assessment  Post-procedure Vital Signs:  Pulse/HCG Rate: 81(!) 110 Temp: (!) 97.2 F (36.2 C) Resp: 20 BP: (!) 117/93 SpO2: 98 %  EBL: None  Complications: No immediate post-treatment complications observed by team, or reported by patient.  Note: The patient tolerated the entire procedure well. A repeat set of vitals were taken after the procedure and the patient was kept under observation following institutional policy, for this type of procedure. Post-procedural neurological assessment was performed, showing return to baseline, prior to discharge. The patient was provided with post-procedure discharge instructions, including a section on how to identify potential problems. Should any problems arise concerning this procedure, the patient was given  instructions to immediately contact us, at any time, without hesitation. In any case, we plan to contact the patient by telephone for a follow-up status report regarding this interventional procedure.  Comments:  No additional relevant information.   5 out of 5 strength bilateral upper extremity: Shoulder abduction, elbow flexion, elbow extension, thumb extension.   Plan of care   Medications administered: We administered iohexol, lidocaine, ropivacaine (PF) 2 mg/mL (0.2%), sodium chloride flush, and dexamethasone.  Follow-up plan:   Return in about 5 weeks (around 11/03/2021) for Post Procedure Evaluation, virtual.     Recent Visits Date Type Provider Dept  09/03/21 Office Visit Gillis Santa, MD [facility]  08/06/21 Procedure visit Gillis Santa, MD [facility]  07/24/21 Office Visit Gillis Santa, MD [facility]  Showing recent visits within past 90 days and meeting all other requirements Today's Visits Date Type Provider Dept  09/29/21 Procedure visit Gillis Santa, MD [facility]  Showing today's visits and meeting all other requirements Future Appointments Date Type Provider Dept  11/03/21 Appointment Gillis Santa, MD [facility]  Showing future appointments within next 90 days and meeting all other requirements   Disposition: Discharge home  Discharge (Date   Time): 09/29/2021; 1410 hrs.   Primary Care Physician: Juline Patch, MD Location: [facility] Note by: Gillis Santa, MD Date: 09/29/2021; Time: 2:44 PM  DISCLAIMER: Medicine is not an exact science. It has no guarantees or warranties. The decision to proceed with this intervention was based on the information collected from the patient. Conclusions were drawn from the patient's questionnaire, interview, and examination. Because information was provided in large part by the patient, it cannot be guaranteed that it has not been purposely  or unconsciously manipulated or altered. Every effort has been made to obtain as much accurate, relevant, available data as possible. Always take into account that the treatment will also be dependent on availability of resources and existing treatment guidelines, considered by other Pain Management Specialists as being common knowledge and practice, at the time of the intervention. It is also important to point out that variation in procedural techniques and pharmacological choices are the acceptable norm. For Medico-Legal review purposes,  the indications, contraindications, technique, and results of the these procedures should only be evaluated, judged and interpreted by a Board-Certified Interventional Pain Specialist with extensive familiarity and expertise in the same exact procedure and technique.

## 2021-09-29 NOTE — Progress Notes (Signed)
Safety precautions to be maintained throughout the outpatient stay will include: orient to surroundings, keep bed in low position, maintain call bell within reach at all times, provide assistance with transfer out of bed and ambulation.  

## 2021-09-30 ENCOUNTER — Telehealth: Payer: Self-pay | Admitting: Student in an Organized Health Care Education/Training Program

## 2021-09-30 ENCOUNTER — Telehealth: Payer: Self-pay | Admitting: *Deleted

## 2021-09-30 NOTE — Telephone Encounter (Signed)
Had CESI yesterday. I explained to patient how the epidural meds affect the pain, instructed her to document on her diary when the pain returned and when it starts to go away. Patient verbalized understanding.

## 2021-09-30 NOTE — Telephone Encounter (Signed)
Patient called earlier today. See phone call encounter.

## 2021-10-01 ENCOUNTER — Ambulatory Visit: Payer: BC Managed Care – PPO | Admitting: Student in an Organized Health Care Education/Training Program

## 2021-10-01 ENCOUNTER — Telehealth: Payer: Self-pay | Admitting: *Deleted

## 2021-10-01 NOTE — Telephone Encounter (Signed)
Oral swab drug screen was consistent for prescribed medications.  ?

## 2021-10-16 ENCOUNTER — Telehealth: Payer: Self-pay | Admitting: *Deleted

## 2021-10-16 ENCOUNTER — Telehealth: Payer: Self-pay | Admitting: Student in an Organized Health Care Education/Training Program

## 2021-10-16 NOTE — Telephone Encounter (Signed)
Message sent to BL ?

## 2021-10-16 NOTE — Telephone Encounter (Signed)
Patient has called and states, CESI on 09/29/21 worked for approx 4 hours then the pain returned and has been severe since that time.  She is wondering if anything else can be done.  I told her that I would run this by BL and see if he has anything more to offer.  ?

## 2021-10-16 NOTE — Telephone Encounter (Signed)
Patient states when she had procedure the pain came back about 4 hours later and has not let up. Please call and advise  ?

## 2021-10-20 ENCOUNTER — Other Ambulatory Visit: Payer: Self-pay | Admitting: Registered Nurse

## 2021-10-20 NOTE — Telephone Encounter (Signed)
Patient called asking a nurse to return her call ?

## 2021-10-21 ENCOUNTER — Encounter: Payer: Self-pay | Admitting: Registered Nurse

## 2021-10-21 ENCOUNTER — Encounter: Payer: BC Managed Care – PPO | Attending: Physical Medicine and Rehabilitation | Admitting: Registered Nurse

## 2021-10-21 ENCOUNTER — Other Ambulatory Visit: Payer: Self-pay

## 2021-10-21 VITALS — BP 121/84 | HR 92 | Ht 63.0 in | Wt 138.0 lb

## 2021-10-21 DIAGNOSIS — G894 Chronic pain syndrome: Secondary | ICD-10-CM | POA: Insufficient documentation

## 2021-10-21 DIAGNOSIS — M5412 Radiculopathy, cervical region: Secondary | ICD-10-CM | POA: Insufficient documentation

## 2021-10-21 DIAGNOSIS — G4700 Insomnia, unspecified: Secondary | ICD-10-CM | POA: Diagnosis not present

## 2021-10-21 DIAGNOSIS — Z79891 Long term (current) use of opiate analgesic: Secondary | ICD-10-CM | POA: Diagnosis not present

## 2021-10-21 DIAGNOSIS — Z5181 Encounter for therapeutic drug level monitoring: Secondary | ICD-10-CM | POA: Diagnosis not present

## 2021-10-21 DIAGNOSIS — M542 Cervicalgia: Secondary | ICD-10-CM | POA: Insufficient documentation

## 2021-10-21 DIAGNOSIS — T451X5A Adverse effect of antineoplastic and immunosuppressive drugs, initial encounter: Secondary | ICD-10-CM | POA: Diagnosis not present

## 2021-10-21 DIAGNOSIS — M961 Postlaminectomy syndrome, not elsewhere classified: Secondary | ICD-10-CM | POA: Insufficient documentation

## 2021-10-21 DIAGNOSIS — G620 Drug-induced polyneuropathy: Secondary | ICD-10-CM | POA: Diagnosis not present

## 2021-10-21 DIAGNOSIS — M5416 Radiculopathy, lumbar region: Secondary | ICD-10-CM | POA: Diagnosis not present

## 2021-10-21 MED ORDER — ZOLPIDEM TARTRATE 10 MG PO TABS: ORAL_TABLET | ORAL | 3 refills | Status: DC

## 2021-10-21 MED ORDER — HYDROCODONE-ACETAMINOPHEN 10-325 MG PO TABS: 1.0000 | ORAL_TABLET | Freq: Every day | ORAL | 0 refills | Status: DC | PRN

## 2021-10-21 MED ORDER — METHOCARBAMOL 500 MG PO TABS: ORAL_TABLET | ORAL | 2 refills | Status: DC

## 2021-10-21 NOTE — Progress Notes (Signed)
? ?Subjective:  ? ? Patient ID: Elizabeth Torres, female    DOB: [birth year], 46 y.o.   MRN: [id number] ? ?HPI: Elizabeth Torres is a 46 y.o. female who returns for follow up appointment for chronic pain and medication refill. She states her pain is located in her neck radiating into left shoulder and lower back pain radiating into her bilateral lower extremities. She rates her pain 7. Her current exercise regime is walking and performing stretching exercises. ? ?Ms. Arntz Morphine equivalent is 50.00 MME.   Last Oral Swab was Performed on 09/23/2021, it was consistent.  ?  ? ?Pain Inventory ?Average Pain 7 ?Pain Right Now 7 ?My pain is constant and sharp ? ?In the last 24 hours, has pain interfered with the following? ?General activity 6 ?Relation with others 6 ?Enjoyment of life 7 ?What TIME of day is your pain at its worst? evening ?Sleep (in general) Poor ? ?Pain is worse with: sitting and inactivity ?Pain improves with: rest, heat/ice, and medication ?Relief from Meds: 5 ? ?Family History  ?Problem Relation Age of Onset  ? Diabetes Mother   ? Hypertension Mother   ? Pulmonary fibrosis Mother   ? Cancer Paternal Uncle   ? Breast cancer Paternal Uncle   ?     40'S  ? Breast cancer Paternal Aunt   ?     60'S  ? Other Father   ?     unknown medical history  ? ?Social History  ? ?Socioeconomic History  ? Marital status: Married  ?  Spouse name: Kobie Matkins  ? Number of children: 3  ? Years of education: Not on file  ? Highest education level: Not on file  ?Occupational History  ? Not on file  ?Tobacco Use  ? Smoking status: Former  ?  Packs/day: 1.00  ?  Years: 20.00  ?  Pack years: 20.00  ?  Types: Cigarettes  ?  Quit date: 01/08/2020  ?  Years since quitting: 1.7  ? Smokeless tobacco: Never  ?Vaping Use  ? Vaping Use: Never used  ?Substance and Sexual Activity  ? Alcohol use: Not Currently  ? Drug use: Never  ? Sexual activity: Yes  ?  Partners: Male  ?Other Topics Concern  ? Not on file  ?Social History Narrative  ? Not on  file  ? ?Social Determinants of Health  ? ?Financial Resource Strain: Not on file  ?Food Insecurity: Not on file  ?Transportation Needs: Not on file  ?Physical Activity: Not on file  ?Stress: Not on file  ?Social Connections: Not on file  ? ?Past Surgical History:  ?Procedure Laterality Date  ? BREAST BIOPSY Right 07/2011  ? invasive ductal carcinoma  ? BREAST LUMPECTOMY Right 08/2011  ? 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 RAd and chemo tx  ? BREAST SURGERY    ? mastectomy Right   ? partial with lymph node dissection  ? PORT A CATH REVISION    ? PORT-A-CATH REMOVAL  11-07-15  ? Dr Bary Castilla  ? SHOULDER ARTHROSCOPY WITH ROTATOR CUFF REPAIR Left 05/18/2019  ? Procedure: SHOULDER ARTHROSCOPY WITH SUBACROMIAL DECOMPRESSION AND DISTAL CLAVICAL EXCISION, INTRAARTICULAR DEBRIDEMENT;  Surgeon: Lovell Sheehan, MD;  Location: [facility];  Service: Orthopedics;  Laterality: Left;  ? [facility] SURGERY  2008  ? Dr Joya Salm  ? TUBAL LIGATION    ? ?Past Surgical History:  ?Procedure Laterality Date  ? BREAST BIOPSY Right 07/2011  ? invasive ductal carcinoma  ? BREAST  LUMPECTOMY Right 08/2011  ? 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 RAd and chemo tx  ? BREAST SURGERY    ? mastectomy Right   ? partial with lymph node dissection  ? PORT A CATH REVISION    ? PORT-A-CATH REMOVAL  11-07-15  ? Dr Bary Castilla  ? SHOULDER ARTHROSCOPY WITH ROTATOR CUFF REPAIR Left 05/18/2019  ? Procedure: SHOULDER ARTHROSCOPY WITH SUBACROMIAL DECOMPRESSION AND DISTAL CLAVICAL EXCISION, INTRAARTICULAR DEBRIDEMENT;  Surgeon: Lovell Sheehan, MD;  Location: [facility];  Service: Orthopedics;  Laterality: Left;  ? [facility] SURGERY  2008  ? Dr Joya Salm  ? TUBAL LIGATION    ? ?Past Medical History:  ?Diagnosis Date  ? BRCA negative   ? Breast cancer ([place]) 2013  ? RT LUMPECTOMY with chemo and rad tx  ? Degenerative disc disease, lumbar   ? Personal history of chemotherapy 2013  ? for breast ca  ? Personal history of radiation therapy 2013   ? F/U right breast cancer   ? Radiation 2013  ? BREAST CA  ? Status post chemotherapy 2013  ? BREAST CA  ? ?BP 121/84   Pulse 92   Ht 5' 3" (1.6 m)   Wt 138 lb (62.6 kg)   SpO2 96%   BMI 24.45 kg/m?  ? ?Opioid Risk Score:   ?Fall Risk Score:  `1 ? ?Depression screen PHQ 2/9 ? ?Depression screen [facility] 2/9 09/23/2021 08/27/2021 08/06/2021 08/05/2021 07/24/2021 07/22/2021 06/24/2021  ?Decreased Interest 0 0 0 0 2 0 1  ?Down, Depressed, Hopeless 0 0 0 0 2 0 1  ?PHQ - 2 Score 0 0 0 0 4 0 2  ?Altered sleeping - - - - 3 0 -  ?Tired, decreased energy - - - - 2 0 -  ?Change in appetite - - - - 0 0 -  ?Feeling bad or failure about yourself  - - - - 0 0 -  ?Trouble concentrating - - - - 2 0 -  ?Moving slowly or fidgety/restless - - - - 2 0 -  ?Suicidal thoughts - - - - 0 0 -  ?PHQ-9 Score - - - - 13 0 -  ?Difficult doing work/chores - - - - - - -  ?Some recent data might be hidden  ?  ? ?Review of Systems  ?Musculoskeletal:  Positive for back pain.  ?     Left arm pain ?Bilateral leg pain  ?All other systems reviewed and are negative. ? ?   ?Objective:  ? Physical Exam ?Vitals and nursing note reviewed.  ?Constitutional:   ?   Appearance: Normal appearance.  ?Neck:  ?   Comments: Cervical Paraspinal Tenderness: C-5-C-6 ?Cardiovascular:  ?   Rate and Rhythm: Normal rate and regular rhythm.  ?   Pulses: Normal pulses.  ?   Heart sounds: Normal heart sounds.  ?Pulmonary:  ?   Effort: Pulmonary effort is normal.  ?   Breath sounds: Normal breath sounds.  ?Musculoskeletal:  ?   Cervical back: Normal range of motion and neck supple.  ?   Comments: Normal Muscle Bulk and Muscle Testing Reveals:  ?Upper Extremities: Full ROM and Muscle Strength 5/5 ? Lower Extremities : Full ROM and Muscle Strength 5/5 ?Arises from Chair with ease ?Narrow Based Gait  ?   ?Skin: ?   General: Skin is warm and dry.  ?Neurological:  ?   Mental Status: She is alert and oriented to person, place, and time.  ?Psychiatric:     ?  Mood and Affect: Mood normal.      ?   Behavior: Behavior normal.  ? ? ? ? ?   ?Assessment & Plan:  ?1. Lumbar postlaminectomy syndrome status post L5-S1 fusion with chronic S1 radiculopathy. Continue current medication regimen with Nortriptyline. 10/21/2021. ?Refilled :  Hydrocodone 10/325 mg #145-one every 6 hours as needed for pain, may take an extra tablet on work days only. 10/21/2021. ?We will continue the opioid monitoring program, this consists of regular clinic visits, examinations, urine drug screen, pill counts as well as use of [place] Controlled Substance Reporting system. A 12 month History has been reviewed on the [place] Controlled Substance Reporting System on 10/21/2021. ?2. 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 10/21/2021 ?3. Insomnia: Continue current medication regimen Ambien. 10/21/2021 ? 4.Chronic Left Shoulder Pain: No complaints today: S/P Left Shoulder Arthroscopy with Rotator Cuff Repair on 05/18/2019 by Dr. Gunnar Fusi : Ortho Following. 10/21/2021 ?5. Cervicalgia/ Cervical Radiculitis: Continue Pamelor Dr Holley Raring following. Ms. Mamaril states she will be referred to Neurosurgery, awaiting an appointment day.  10/21/2021 ?  ?F/U in 1 month. ?  ?

## 2021-10-24 ENCOUNTER — Encounter: Payer: BC Managed Care – PPO | Admitting: Family Medicine

## 2021-10-30 DIAGNOSIS — M542 Cervicalgia: Secondary | ICD-10-CM | POA: Diagnosis not present

## 2021-10-30 DIAGNOSIS — M4012 Other secondary kyphosis, cervical region: Secondary | ICD-10-CM | POA: Diagnosis not present

## 2021-10-30 DIAGNOSIS — M5412 Radiculopathy, cervical region: Secondary | ICD-10-CM | POA: Diagnosis not present

## 2021-11-03 ENCOUNTER — Ambulatory Visit
Payer: BC Managed Care – PPO | Attending: Student in an Organized Health Care Education/Training Program | Admitting: Student in an Organized Health Care Education/Training Program

## 2021-11-03 ENCOUNTER — Encounter: Payer: Self-pay | Admitting: Student in an Organized Health Care Education/Training Program

## 2021-11-03 DIAGNOSIS — M5412 Radiculopathy, cervical region: Secondary | ICD-10-CM

## 2021-11-03 DIAGNOSIS — G894 Chronic pain syndrome: Secondary | ICD-10-CM

## 2021-11-03 DIAGNOSIS — M4802 Spinal stenosis, cervical region: Secondary | ICD-10-CM | POA: Diagnosis not present

## 2021-11-03 NOTE — Progress Notes (Signed)
Patient: Elizabeth Torres  Service Category: E/M  Provider: Gillis Santa, MD  ?DOB: 1976-04-17  DOS: 11/03/2021  Location: Office  ?MRN: [id number]  Setting: Ambulatory outpatient  Referring Provider: Juline Patch, MD  ?Type: Established Patient  Specialty: Interventional Pain Management  PCP: Juline Patch, MD  ?Location: Remote location  Delivery: TeleHealth    ? ?Virtual Encounter - Pain Management ?PROVIDER NOTE: Information contained herein reflects review and annotations entered in association with encounter. Interpretation of such information and data should be left to medically-trained personnel. Information provided to patient can be located elsewhere in the medical record under "Patient Instructions". Document created using STT-dictation technology, any transcriptional errors that may result from process are unintentional.  ?  ?Contact & Pharmacy ?Preferred: [phone] ?Home: [phone] (home) ?Mobile: [phone] (mobile) ?E-mail: [email or web address]  ?[facility], [place] [address] ?[place] [address] ?Phone: [phone] Fax: [phone] ?  ?Pre-screening  ?Elizabeth Torres offered "in-person" vs "virtual" encounter. She indicated preferring virtual for this encounter.  ? ?Reason ?COVID-19*  Social distancing based on CDC and AMA recommendations.  ? ?I contacted [name] on 11/03/2021 via telephone.      I clearly identified myself as Gillis Santa, MD. I verified that I was speaking with the correct person using two identifiers (Name: Elizabeth Torres, and date of birth: [birth year]). ? ?Consent ?I sought verbal advanced consent from [name] for virtual visit interactions. I informed Ms. Besser of possible security and privacy concerns, risks, and limitations associated with providing "not-in-person" medical evaluation and management services. I also informed Ms. Cheong of the availability of "in-person" appointments.  Finally, I informed her that there would be a charge for the virtual visit and that she could be  personally, fully or partially, financially responsible for it. Ms. Kijowski expressed understanding and agreed to proceed.  ? ?Historic Elements   ?Ms. TERE Torres is a 46 y.o. year old, female patient evaluated today after our last contact on 10/16/2021. [name]  has a past medical history of BRCA negative, Breast cancer ([place]) (2013), Degenerative disc disease, lumbar, Personal history of chemotherapy (2013), Personal history of radiation therapy (2013), Radiation (2013), and Status post chemotherapy (2013). She also  has a past surgical history that includes Spine surgery (2008); mastectomy (Right); Breast surgery; Port a cath revision; Port-a-cath removal (11-07-15); Breast lumpectomy (Right, 08/2011); Breast biopsy (Right, 07/2011); Tubal ligation; and Shoulder arthroscopy with rotator cuff repair (Left, 05/18/2019). Elizabeth Torres has a current medication list which includes the following prescription(s): hydrochlorothiazide, hydrocodone-acetaminophen, ibuprofen, methocarbamol, minoxidil, nortriptyline, vitamin d (ergocalciferol), and zolpidem. She  reports that she quit smoking about 21 months ago. Her smoking use included cigarettes. She has a 20.00 pack-year smoking history. She has never used smokeless tobacco. She reports that she does not currently use alcohol. She reports that she does not use drugs. [name] is allergic to penicillins, pregabalin, sulfa antibiotics, ondansetron, other, and zofran [ondansetron hcl].  ? ?HPI  ?Today, she is being contacted for a post-procedure assessment. ? ? ?Post-procedure evaluation  ? Interlaminar Cervical Epidural Steroid injection (ESI) #2 (#1 done 08/06/21) ?Laterality: Midline ?Level: C7-T1 ?Imaging: Fluoroscopy-assisted ?DOS: 09/29/2021 ?Performed by: Gillis Santa, MD ?Anesthesia: Local anesthesia (1-2% Lidocaine) ?Anxiolysis: None ?Sedation: None.  ? ?Purpose:  Diagnostic/Therapeutic ?Indications: Cervicalgia, cervical radicular pain, degenerative disc disease, severe enough to impact quality of life or function. ?1. Cervical radicular pain   ?  2. Spinal stenosis in cervical region   ?3. Chronic pain syndrome   ? ?NAS-11 score:  ? Pre-procedure: 9 /10  ? Post-procedure: 0-No pain/10  ?   ?Effectiveness:  ?Initial hour after procedure: 100 %  ?Subsequent 4-6 hours post-procedure: 100 % (had great relief up to the 5 hour mark and the pain returned.)  ?Analgesia past initial 6 hours: 0 %  ?Ongoing improvement:  ?Analgesic:  0% ?Function: No benefit ?ROM: No benefit ? ? ?Laboratory Chemistry Profile  ? ?Renal ?Lab Results  ?Component Value Date  ? BUN 15 07/22/2021  ? CREATININE 0.78 07/22/2021  ? LABCREA 12.15 (L) 04/17/2015  ? BCR 19 07/22/2021  ? GFRAA 124 01/26/2020  ? GFRNONAA 107 01/26/2020  ?  Hepatic ?Lab Results  ?Component Value Date  ? AST 23 01/26/2020  ? ALT 26 01/26/2020  ? ALBUMIN 4.2 07/22/2021  ? ALKPHOS 57 01/26/2020  ?  ?Electrolytes ?Lab Results  ?Component Value Date  ? NA 139 07/22/2021  ? K 4.7 07/22/2021  ? CL 103 07/22/2021  ? CALCIUM 9.3 07/22/2021  ? PHOS 3.3 07/22/2021  ?  Bone ?No results found for: [place], [id number], [id number], [id number], 25OHVITD1, 25OHVITD2, 25OHVITD3, TESTOFREE, TESTOSTERONE  ?Inflammation (CRP: Acute Phase) (ESR: Chronic Phase) ?Lab Results  ?Component Value Date  ? ESRSEDRATE 15 01/06/2010  ?    ?  ? ?Note: Above Lab results reviewed. ? ?Assessment  ?The primary encounter diagnosis was Cervical radicular pain. Diagnoses of Spinal stenosis in cervical region and Chronic pain syndrome were also pertinent to this visit. ? ?Plan of Care  ?Unfortunately no benefit with her previous cervical epidural steroid injection.  She saw Dr. Cari Caraway for cervical kyphosis and he has recommended a 3 level anterior cervical discectomy and fusion between C4-C7.  Prior to doing that he would like for her to continue with physical therapy and  he will see her back in 6 weeks. ?She can follow-up with me as needed. ? ? ? ?Follow-up plan:   ?Return if symptoms worsen or fail to improve.   ? ?Recent Visits ?Date Type Provider Dept  ?09/29/21 Procedure visit Gillis Santa, MD [facility]  ?09/03/21 Office Visit Gillis Santa, MD [facility]  ?08/06/21 Procedure visit Gillis Santa, MD [facility]  ?Showing recent visits within past 90 days and meeting all other requirements ?Today's Visits ?Date Type Provider Dept  ?11/03/21 Office Visit Gillis Santa, MD [facility]  ?Showing today's visits and meeting all other requirements ?Future Appointments ?No visits were found meeting these conditions. ?Showing future appointments within next 90 days and meeting all other requirements ? ?I discussed the assessment and treatment plan with the patient. The patient was provided an opportunity to ask questions and all were answered. The patient agreed with the plan and demonstrated an understanding of the instructions. ? ?Patient advised to call back or seek an in-person evaluation if the symptoms or condition worsens. ? ?Duration of encounter: 8mnutes. ? ?Note by: BGillis Santa MD ?Date: 11/03/2021; Time: 2:56 PM ?

## 2021-11-10 NOTE — Therapy (Signed)
?OUTPATIENT PHYSICAL THERAPY NECK EVALUATION ? ? ?Patient Name: Elizabeth Torres ?MRN: [id number] ?DOB:Dec 27, 1975, 46 y.o., female ?Today's Date: 11/11/2021 ? ? PT End of Session - 11/11/21 0932   ? ? Visit Number 1   ? Number of Visits 17   ? Date for PT Re-Evaluation 01/06/22   ? Authorization Type eval: 11/11/21   ? PT Start Time 1607   ? PT Stop Time 1015   ? PT Time Calculation (min) 40 min   ? Activity Tolerance Patient tolerated treatment well   ? Behavior During Therapy [facility] for tasks assessed/performed   ? ?  ?  ? ?  ? ? ?Past Medical History:  ?Diagnosis Date  ? BRCA negative   ? Breast cancer ([place]) 2013  ? RT LUMPECTOMY with chemo and rad tx  ? Degenerative disc disease, lumbar   ? Personal history of chemotherapy 2013  ? for breast ca  ? Personal history of radiation therapy 2013  ? F/U right breast cancer   ? Radiation 2013  ? BREAST CA  ? Status post chemotherapy 2013  ? BREAST CA  ? ?Past Surgical History:  ?Procedure Laterality Date  ? BREAST BIOPSY Right 07/2011  ? invasive ductal carcinoma  ? BREAST LUMPECTOMY Right 08/2011  ? 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 RAd and chemo tx  ? BREAST SURGERY    ? mastectomy Right   ? partial with lymph node dissection  ? PORT A CATH REVISION    ? PORT-A-CATH REMOVAL  11-07-15  ? Dr Bary Castilla  ? SHOULDER ARTHROSCOPY WITH ROTATOR CUFF REPAIR Left 05/18/2019  ? Procedure: SHOULDER ARTHROSCOPY WITH SUBACROMIAL DECOMPRESSION AND DISTAL CLAVICAL EXCISION, INTRAARTICULAR DEBRIDEMENT;  Surgeon: Lovell Sheehan, MD;  Location: [facility];  Service: Orthopedics;  Laterality: Left;  ? [facility] SURGERY  2008  ? Dr Joya Salm  ? TUBAL LIGATION    ? ?Patient Active Problem List  ? Diagnosis Date Noted  ? Cervical radicular pain 07/24/2021  ? Spinal stenosis in cervical region 07/24/2021  ? Chronic pain syndrome 07/24/2021  ? Cervical spondylosis with radiculopathy 02/25/2021  ? Mass of soft tissue of upper arm 01/31/2021  ? Elevated blood-pressure reading, without  diagnosis of hypertension 08/26/2020  ? Menorrhagia with irregular cycle 04/02/2020  ? Rotator cuff impingement syndrome, left 12/15/2018  ? Cervical myofascial pain syndrome 02/14/2016  ? History of breast cancer 11/07/2015  ? Postlaminectomy syndrome, lumbar region 04/12/2012  ? Chemotherapy-induced neuropathy ([place]) 04/12/2012  ? 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 ([place]) 08/07/2011  ? ? ?PCP: Juline Patch, MD ? ?REFERRING PROVIDER: Meade Maw, MD ? 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 ?THERAPY DIAG: Cervicalgia ? ?Muscle weakness (generalized) ? ?ONSET DATE: 10/01/2020 (approximate) ? ?FOLLOW UP APPT WITH PROVIDER: Yes, pt was advised to follow-up 6 weeks after her initial consult with neurosurgery ? ? ?SUBJECTIVE:                                                                                                                                                                                        ? ?  Chief Complaint: Neck pain ? ?Pertinent History ?Pt reports that she started having neck and bilateral shoulder pain around March of 2022. She was seen by this author in September 2022 for one evaluation and two follow-up treatment visits without any benefit. Treatment was extremely limited at that time due to highly irritable pain. With respect to the neck/shoulder pain pt report no known traumatic. Per medical record pt with known cervical spondylosis. No history of cervical surgery. Pain radiates into her L shoulder and LUE. She has a history of L shoulder arthroscopy with rotator cuff repair on 05/18/2019 with Dr. Harlow Mares. Prior to the surgery pt reports severe L shoulder pain for 6-8 months. Previous L shoulder MRI with evidence of supraspinatus tendinosis and detached long head of the biceps with denervation edema and mild atrophy. She saw Dr. Zigmund Daniel in 2022 who started her on duloxetine  previously helped with the pain but was discontinued. She has also taken diclofenac in the past but that has been discontinued. She continues taking a prescription narcotic which is prescribed to help with her chronic back pain. Pt has a history of 2 low back surgeries as well as a history of breast cancer with chemotherapy-induced polyneuropathy affecting the plantar surface of both feet. She has had cervical injections for her pain. The first injection resulted in pain relief which lasted approximately 3 months. However the last injection in February 2023 only offered 5 hours of relief followed by a return of her symptoms. She has consulted with neurosurgery who has recommended a 3 level anterior cervical discectomy and fusion between C4-C7.  Prior to doing that he would like for her to continue with physical therapy.  ? ?Pain:  ?Pain Intensity: Present: 6/10, Best: 6/10, Worst: 9/10 ?Pain location: L trapezius ridge radiating down the LUE all the way to the left hand ?Pain Quality: stabbing  ?Radiating: Yes, L trapezius ridge radiating down the LUE all the way to the left hand ?Numbness/Tingling: Yes, numbness in LUE digits 3 and 4 as well as thenar eminence. Pt reports negative left carpal tunnel testing in the past. ?Focal Weakness: Yes, weakness in left grip and weakness with LUE overhead lifting ?Aggravating factors: LUE lifting, turning head in all directions ?Relieving factors: lay on back, no help with narcotics or other pain medication ?24-hour pain behavior: varies throughout the day depending on activity level ?History of prior neck injury, pain, surgery, or therapy: Yes, previously treated at this clinic for these symptoms without benefit (eval, 2 treatments) ?Falls: Has patient fallen in last 6 months? No ?Dominant hand: left ?Imaging: Yes, ?C spine xrays 01/31/21: Mid cervical degenerative changes.  Loss of cervical lordosis.    ? ?MRI C spine 04/29/2021 ?1. Disc degeneration with reversal of cervical  lordosis that has  ?progressed from a 2011 myelogram.  ?2. Disc protrusions contact the ventral cord at C5-6 and C6-7.  ?Diffusely patent spinal canal.  ?3. Right foraminal impingement at C4-5 and especially C5-6.  ?4. Moderate left foraminal impingement at C5-6.  ? 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 Positive for unrelenting pain and history of breast cancer ? ?Precautions: None ? ?Weight Bearing Restrictions: No ? ?Living  Environment ?Lives with: lives with their family and lives with their spouse ?Lives in: House/apartment, no functional limitations at home ? ? ?Patient Goals: Decrease pain and be able to work without pain ? ? ?OBJECTIVE:  ? ?Patient Surveys  ?FOTO: 35, predicted improvement to 53 ?NDI: 36% ? ?Cognition ?Patient is oriented to person, place, and time.  ?Recent memory is intact.  ?Remote memory is intact.  ?Attention span and concentration are intact.  ?Expressive speech is intact.  ?Patient's fund of knowledge is within normal limits for educational level. ?   ?Gross Musculoskeletal Assessment ?Tremor: None ?Bulk: Normal ?Tone: Normal ? ?Gait ?No gross deficits noted in gait with the exception of self-limiting spontaneous head turns ? ?Posture ?Pt sits with upright posture, very guarded, loss of normal cervical lordotic curve in sitting; ? ?AROM ? ?AROM (Normal range in degrees) AROM ?11/11/2021  ?Cervical  ?Flexion (50) 15*  ?Extension (80) 10*  ?Right lateral flexion (45) 10*  ?Left lateral flexion (45) 12*  ?Right rotation (85) 25*  ?Left rotation (85) 19*  ?(* = pain; Blank rows = not tested) ? ?Pt brings  shoulders to about 90 degrees of flexion, abduction of R shoulder is 90, L is about 70, limited by pain, No limitations in elbows, wrists, or hands however all LUE AROM is painful ? ?PROM ?Deferred secondary

## 2021-11-11 ENCOUNTER — Ambulatory Visit: Payer: BC Managed Care – PPO | Attending: Neurosurgery

## 2021-11-11 DIAGNOSIS — M542 Cervicalgia: Secondary | ICD-10-CM | POA: Diagnosis not present

## 2021-11-11 DIAGNOSIS — M6281 Muscle weakness (generalized): Secondary | ICD-10-CM | POA: Insufficient documentation

## 2021-11-12 ENCOUNTER — Encounter: Payer: BC Managed Care – PPO | Attending: Physical Medicine and Rehabilitation | Admitting: Registered Nurse

## 2021-11-12 ENCOUNTER — Encounter: Payer: Self-pay | Admitting: Registered Nurse

## 2021-11-12 VITALS — BP 121/84 | HR 92 | Ht 63.0 in | Wt 138.0 lb

## 2021-11-12 DIAGNOSIS — Z5181 Encounter for therapeutic drug level monitoring: Secondary | ICD-10-CM

## 2021-11-12 DIAGNOSIS — Z79891 Long term (current) use of opiate analgesic: Secondary | ICD-10-CM

## 2021-11-12 DIAGNOSIS — M961 Postlaminectomy syndrome, not elsewhere classified: Secondary | ICD-10-CM | POA: Diagnosis not present

## 2021-11-12 DIAGNOSIS — M542 Cervicalgia: Secondary | ICD-10-CM | POA: Diagnosis not present

## 2021-11-12 DIAGNOSIS — M5416 Radiculopathy, lumbar region: Secondary | ICD-10-CM

## 2021-11-12 DIAGNOSIS — T451X5A Adverse effect of antineoplastic and immunosuppressive drugs, initial encounter: Secondary | ICD-10-CM

## 2021-11-12 DIAGNOSIS — G894 Chronic pain syndrome: Secondary | ICD-10-CM

## 2021-11-12 DIAGNOSIS — M5412 Radiculopathy, cervical region: Secondary | ICD-10-CM

## 2021-11-12 DIAGNOSIS — G620 Drug-induced polyneuropathy: Secondary | ICD-10-CM | POA: Insufficient documentation

## 2021-11-12 DIAGNOSIS — G4700 Insomnia, unspecified: Secondary | ICD-10-CM

## 2021-11-12 MED ORDER — HYDROCODONE-ACETAMINOPHEN 10-325 MG PO TABS: 1.0000 | ORAL_TABLET | Freq: Every day | ORAL | 0 refills | Status: DC | PRN

## 2021-11-12 NOTE — Progress Notes (Signed)
? ?Subjective:  ? ? Patient ID: Elizabeth Torres, female    DOB: 07-05-1976, 46 y.o.   MRN: [id number] ? ?HPI: Elizabeth Torres is a 46 y.o. female whose appointment was changed to My-Chart Video visit today, she had a flat tire. :We have  discussed the limitations of evaluation and management by telemedicine and the availability of in person appointments. Elizabeth Torres expressed understanding and agreed to proceed. ?She states her pain is located in her neck radiating into her bilateral shoulders and lower back pain radiating into her bilateral lower extremities. She rates her pain 5. Her current exercise regime is walking and performing stretching exercises. ? ?Elizabeth Torres Morphine equivalent is 50.00 MME.   Last Oral Swab was Performed on 09/23/2021, it was consistent.  ?  ? ? ?Pain Inventory ?Average Pain 5 ?Pain Right Now 5 ?My pain is constant and aching ? ?In the last 24 hours, has pain interfered with the following? ?General activity 6 ?Relation with others 6 ?Enjoyment of life 7 ?What TIME of day is your pain at its worst? evening ?Sleep (in general) Fair ? ?Pain is worse with: sitting and inactivity ?Pain improves with: rest and medication ?Relief from Meds: 9 ? ?Family History  ?Problem Relation Age of Onset  ? Diabetes Mother   ? Hypertension Mother   ? Pulmonary fibrosis Mother   ? Cancer Paternal Uncle   ? Breast cancer Paternal Uncle   ?     40'S  ? Breast cancer Paternal Aunt   ?     60'S  ? Other Father   ?     unknown medical history  ? ?Social History  ? ?Socioeconomic History  ? Marital status: Married  ?  Spouse name: Chabeli Barsamian  ? Number of children: 3  ? Years of education: Not on file  ? Highest education level: Not on file  ?Occupational History  ? Not on file  ?Tobacco Use  ? Smoking status: Former  ?  Packs/day: 1.00  ?  Years: 20.00  ?  Pack years: 20.00  ?  Types: Cigarettes  ?  Quit date: 01/08/2020  ?  Years since quitting: 1.8  ? Smokeless tobacco: Never  ?Vaping Use  ? Vaping Use: Never used   ?Substance and Sexual Activity  ? Alcohol use: Not Currently  ? Drug use: Never  ? Sexual activity: Yes  ?  Partners: Male  ?Other Topics Concern  ? Not on file  ?Social History Narrative  ? Not on file  ? ?Social Determinants of Health  ? ?Financial Resource Strain: Not on file  ?Food Insecurity: Not on file  ?Transportation Needs: Not on file  ?Physical Activity: Not on file  ?Stress: Not on file  ?Social Connections: Not on file  ? ?Past Surgical History:  ?Procedure Laterality Date  ? BREAST BIOPSY Right 07/2011  ? invasive ductal carcinoma  ? BREAST LUMPECTOMY Right 08/2011  ? 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 RAd and chemo tx  ? BREAST SURGERY    ? mastectomy Right   ? partial with lymph node dissection  ? PORT A CATH REVISION    ? PORT-A-CATH REMOVAL  11-07-15  ? Dr Bary Castilla  ? SHOULDER ARTHROSCOPY WITH ROTATOR CUFF REPAIR Left 05/18/2019  ? Procedure: SHOULDER ARTHROSCOPY WITH SUBACROMIAL DECOMPRESSION AND DISTAL CLAVICAL EXCISION, INTRAARTICULAR DEBRIDEMENT;  Surgeon: Lovell Sheehan, MD;  Location: [place];  Service: Orthopedics;  Laterality: Left;  ? [place] SURGERY  2008  ? Dr  Botero  ? TUBAL LIGATION    ? ?Past Surgical History:  ?Procedure Laterality Date  ? BREAST BIOPSY Right 07/2011  ? invasive ductal carcinoma  ? BREAST LUMPECTOMY Right 08/2011  ? 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 RAd and chemo tx  ? BREAST SURGERY    ? mastectomy Right   ? partial with lymph node dissection  ? PORT A CATH REVISION    ? PORT-A-CATH REMOVAL  11-07-15  ? Dr Bary Castilla  ? SHOULDER ARTHROSCOPY WITH ROTATOR CUFF REPAIR Left 05/18/2019  ? Procedure: SHOULDER ARTHROSCOPY WITH SUBACROMIAL DECOMPRESSION AND DISTAL CLAVICAL EXCISION, INTRAARTICULAR DEBRIDEMENT;  Surgeon: Lovell Sheehan, MD;  Location: [place];  Service: Orthopedics;  Laterality: Left;  ? [place] SURGERY  2008  ? Dr Joya Salm  ? TUBAL LIGATION    ? ?Past Medical History:  ?Diagnosis Date  ? BRCA negative   ? Breast  cancer ([place]) 2013  ? RT LUMPECTOMY with chemo and rad tx  ? Degenerative disc disease, lumbar   ? Personal history of chemotherapy 2013  ? for breast ca  ? Personal history of radiation therapy 2013  ? F/U right breast cancer   ? Radiation 2013  ? BREAST CA  ? Status post chemotherapy 2013  ? BREAST CA  ? ?BP 121/84 Comment: last recorded  Pulse 92 Comment: last recorded  Ht '5\' 3"'  (1.6 m) Comment: last reported  Wt 138 lb (62.6 kg) Comment: last recorded  BMI 24.45 kg/m?  ? ?Opioid Risk Score:   ?Fall Risk Score:  `1 ? ?Depression screen PHQ 2/9 ? ? ?  11/12/2021  ?  9:38 AM 10/21/2021  ?  9:27 AM 09/23/2021  ?  9:34 AM 08/27/2021  ?  9:24 AM 08/06/2021  ?  9:24 AM 08/05/2021  ?  2:47 PM 07/24/2021  ? 12:54 PM  ?Depression screen PHQ 2/9  ?Decreased Interest 0 0 0 0 0 0 2  ?Down, Depressed, Hopeless 0 0 0 0 0 0 2  ?PHQ - 2 Score 0 0 0 0 0 0 4  ?Altered sleeping       3  ?Tired, decreased energy       2  ?Change in appetite       0  ?Feeling bad or failure about yourself        0  ?Trouble concentrating       2  ?Moving slowly or fidgety/restless       2  ?Suicidal thoughts       0  ?PHQ-9 Score       13  ?  ?Review of Systems  ?Constitutional: Negative.   ?HENT: Negative.    ?Eyes: Negative.   ?Respiratory: Negative.    ?Cardiovascular: Negative.   ?Gastrointestinal: Negative.   ?Endocrine: Negative.   ?Genitourinary: Negative.   ?Musculoskeletal:  Positive for back pain.  ?Skin: Negative.   ?Allergic/Immunologic: Negative.   ?Neurological: Negative.   ?Hematological: Negative.   ?Psychiatric/Behavioral: Negative.    ?All other systems reviewed and are negative. ? ?   ?Objective:  ? Physical Exam ?Vitals and nursing note reviewed.  ?Musculoskeletal:  ?   Comments: No Physical Exam Performed, My Chart Video Visit  ? ? ? ? ?   ?Assessment & Plan:  ?1. Lumbar postlaminectomy syndrome status post L5-S1 fusion with chronic S1 radiculopathy. Continue current medication regimen with Nortriptyline. 11/12/2021. ?Refilled :   Hydrocodone 10/325 mg #145-one every 6 hours as needed for pain, may take an extra tablet  on work days only. 10/21/2021. ?We will continue the opioid monitoring program, this consists of regular clinic visits, examinations, urine drug screen, pill counts as well as use of [place] Controlled Substance Reporting system. A 12 month History has been reviewed on the [place] Controlled Substance Reporting System on 11/12/2021. ?2. 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 11/12/2021 ?3. Insomnia: Continue current medication regimen Ambien. 11/12/2021 ? 4.Chronic Left Shoulder Pain: No complaints today: S/P Left Shoulder Arthroscopy with Rotator Cuff Repair on 05/18/2019 by Dr. Gunnar Fusi : Ortho Following. 11/12/2021 ?5. Cervicalgia/ Cervical Radiculitis: Continue Pamelor Dr Holley Raring following. Ms. Haverstick states she has a  Neurosurgery, appointment . We will continue to monitor.  11/12/2021 ?  ?F/U in 1 month. ?My- Chart Video Visit ?Established patient ?Location of Patient: in her Home ?Location of Provider in the office  ? ?  ? ? ? ? ? ?

## 2021-11-24 ENCOUNTER — Ambulatory Visit: Payer: BC Managed Care – PPO

## 2021-11-24 DIAGNOSIS — M542 Cervicalgia: Secondary | ICD-10-CM

## 2021-11-24 DIAGNOSIS — M6281 Muscle weakness (generalized): Secondary | ICD-10-CM | POA: Diagnosis not present

## 2021-11-24 NOTE — Therapy (Signed)
?OUTPATIENT PHYSICAL THERAPY NECK TREATMENT ? ? ?Patient Name: Elizabeth Torres ?MRN: [id number] ?DOB:09/07/1975, 46 y.o., female ?Today's Date: 11/24/2021 ? ? PT End of Session - 11/24/21 0930   ? ? Visit Number 2   ? Number of Visits 17   ? Date for PT Re-Evaluation 01/06/22   ? Authorization Type eval: 11/11/21   ? PT Start Time 2952   ? PT Stop Time 1015   ? PT Time Calculation (min) 43 min   ? Activity Tolerance Patient tolerated treatment well   ? Behavior During Therapy [facility] for tasks assessed/performed   ? ?  ?  ? ?  ? ? ?Past Medical History:  ?Diagnosis Date  ? BRCA negative   ? Breast cancer ([place]) 2013  ? RT LUMPECTOMY with chemo and rad tx  ? Degenerative disc disease, lumbar   ? Personal history of chemotherapy 2013  ? for breast ca  ? Personal history of radiation therapy 2013  ? F/U right breast cancer   ? Radiation 2013  ? BREAST CA  ? Status post chemotherapy 2013  ? BREAST CA  ? ?Past Surgical History:  ?Procedure Laterality Date  ? BREAST BIOPSY Right 07/2011  ? invasive ductal carcinoma  ? BREAST LUMPECTOMY Right 08/2011  ? 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 RAd and chemo tx  ? BREAST SURGERY    ? mastectomy Right   ? partial with lymph node dissection  ? PORT A CATH REVISION    ? PORT-A-CATH REMOVAL  11-07-15  ? Dr Bary Castilla  ? SHOULDER ARTHROSCOPY WITH ROTATOR CUFF REPAIR Left 05/18/2019  ? Procedure: SHOULDER ARTHROSCOPY WITH SUBACROMIAL DECOMPRESSION AND DISTAL CLAVICAL EXCISION, INTRAARTICULAR DEBRIDEMENT;  Surgeon: Lovell Sheehan, MD;  Location: [place];  Service: Orthopedics;  Laterality: Left;  ? [place] SURGERY  2008  ? Dr Joya Salm  ? TUBAL LIGATION    ? ?Patient Active Problem List  ? Diagnosis Date Noted  ? Cervical radicular pain 07/24/2021  ? Spinal stenosis in cervical region 07/24/2021  ? Chronic pain syndrome 07/24/2021  ? Cervical spondylosis with radiculopathy 02/25/2021  ? Mass of soft tissue of upper arm 01/31/2021  ? Elevated blood-pressure reading, without  diagnosis of hypertension 08/26/2020  ? Menorrhagia with irregular cycle 04/02/2020  ? Rotator cuff impingement syndrome, left 12/15/2018  ? Cervical myofascial pain syndrome 02/14/2016  ? History of breast cancer 11/07/2015  ? Postlaminectomy syndrome, lumbar region 04/12/2012  ? Chemotherapy-induced neuropathy ([place]) 04/12/2012  ? 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 ([place]) 08/07/2011  ? ? ?PCP: Juline Patch, MD ? ?REFERRING PROVIDER: Meade Maw, MD ? 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 ?THERAPY DIAG: Cervicalgia ? ?Muscle weakness (generalized) ? ?ONSET DATE: 10/01/2020 (approximate) ? ?FOLLOW UP APPT WITH PROVIDER: Yes, pt was advised to follow-up 6 weeks after her initial consult with neurosurgery on 12/11/21 ? ?From initial evaluation 11/11/21 ? ?SUBJECTIVE:                                                                                                                                                                                        ? ?  Chief Complaint: Neck pain ? ?Pertinent History ?Pt reports that she started having neck and bilateral shoulder pain around March of 2022. She was seen by this author in September 2022 for one evaluation and two follow-up treatment visits without any benefit. Treatment was extremely limited at that time due to highly irritable pain. With respect to the neck/shoulder pain pt report no known traumatic. Per medical record pt with known cervical spondylosis. No history of cervical surgery. Pain radiates into her L shoulder and LUE. She has a history of L shoulder arthroscopy with rotator cuff repair on 05/18/2019 with Dr. Harlow Mares. Prior to the surgery pt reports severe L shoulder pain for 6-8 months. Previous L shoulder MRI with evidence of supraspinatus tendinosis and detached long head of the biceps with denervation edema and mild atrophy. She saw Dr.  Zigmund Daniel in 2022 who started her on duloxetine previously helped with the pain but was discontinued. She has also taken diclofenac in the past but that has been discontinued. She continues taking a prescription narcotic which is prescribed to help with her chronic back pain. Pt has a history of 2 low back surgeries as well as a history of breast cancer with chemotherapy-induced polyneuropathy affecting the plantar surface of both feet. She has had cervical injections for her pain. The first injection resulted in pain relief which lasted approximately 3 months. However the last injection in February 2023 only offered 5 hours of relief followed by a return of her symptoms. She has consulted with neurosurgery who has recommended a 3 level anterior cervical discectomy and fusion between C4-C7.  Prior to doing that he would like for her to continue with physical therapy.  ? ?Pain:  ?Pain Intensity: Present: 6/10, Best: 6/10, Worst: 9/10 ?Pain location: L trapezius ridge radiating down the LUE all the way to the left hand ?Pain Quality: stabbing  ?Radiating: Yes, L trapezius ridge radiating down the LUE all the way to the left hand ?Numbness/Tingling: Yes, numbness in LUE digits 3 and 4 as well as thenar eminence. Pt reports negative left carpal tunnel testing in the past. ?Focal Weakness: Yes, weakness in left grip and weakness with LUE overhead lifting ?Aggravating factors: LUE lifting, turning head in all directions ?Relieving factors: lay on back, no help with narcotics or other pain medication ?24-hour pain behavior: varies throughout the day depending on activity level ?History of prior neck injury, pain, surgery, or therapy: Yes, previously treated at this clinic for these symptoms without benefit (eval, 2 treatments) ?Falls: Has patient fallen in last 6 months? No ?Dominant hand: left ?Imaging: Yes, ?C spine xrays 01/31/21: Mid cervical degenerative changes.  Loss of cervical lordosis.    ? ?MRI C spine  04/29/2021 ?1. Disc degeneration with reversal of cervical lordosis that has  ?progressed from a 2011 myelogram.  ?2. Disc protrusions contact the ventral cord at C5-6 and C6-7.  ?Diffusely patent spinal canal.  ?3. Right foraminal impingement at C4-5 and especially C5-6.  ?4. Moderate left foraminal impingement at C5-6.  ? 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 Positive for unrelenting pain and history of breast cancer ? ?Precautions: None ? ?Weight Bearing Restrictions: No ? ?Living  Environment ?Lives with: lives with their family and lives with their spouse ?Lives in: House/apartment, no functional limitations at home ? ? ?Patient Goals: Decrease pain and be able to work without pain ? ? ?OBJECTIVE:  ? ?Patient Surveys  ?FOTO: 35, predicted improvement to 53 ?NDI: 36% ? ?Cognition ?Patient is oriented to person, place, and time.  ?Recent memory is intact.  ?Remote memory is intact.  ?Attention span and concentration are intact.  ?Expressive speech is intact.  ?Patient's fund of knowledge is within normal limits for educational level. ?   ?Gross Musculoskeletal Assessment ?Tremor: None ?Bulk: Normal ?Tone: Normal ? ?Gait ?No gross deficits noted in gait with the exception of self-limiting spontaneous head turns ? ?Posture ?Pt sits with upright posture, very guarded, loss of normal cervical lordotic curve in sitting; ? ?AROM ? ?AROM (Normal range in degrees) AROM ?11/24/2021  ?Cervical  ?Flexion (50) 15*  ?Extension (80) 10*  ?Right lateral flexion (45) 10*  ?Left lateral flexion (45) 12*  ?Right rotation (85) 25*  ?Left rotation (85)  19*  ?(* = pain; Blank rows = not tested) ? ?Pt brings shoulders to about 90 degrees of flexion, abduction of R shoulder is 90, L is about 70, limited by pain, No limitations in elbows, wrists, or hands however all LUE

## 2021-12-01 ENCOUNTER — Ambulatory Visit: Payer: BC Managed Care – PPO | Attending: Neurosurgery

## 2021-12-01 DIAGNOSIS — M542 Cervicalgia: Secondary | ICD-10-CM | POA: Insufficient documentation

## 2021-12-01 NOTE — Therapy (Signed)
?OUTPATIENT PHYSICAL THERAPY NECK TREATMENT ? ? ?Patient Name: Elizabeth Torres ?MRN: [id number] ?DOB:1976-05-25, 46 y.o., female ?Today's Date: 12/01/2021 ? ? PT End of Session - 12/01/21 0937   ? ? Visit Number 3   ? Number of Visits 17   ? Date for PT Re-Evaluation 01/06/22   ? Authorization Type eval: 11/11/21   ? PT Start Time [id number]   ? PT Stop Time 1003   ? PT Time Calculation (min) 30 min   ? Activity Tolerance Patient tolerated treatment well   ? Behavior During Therapy [facility] for tasks assessed/performed   ? ?  ?  ? ?  ? ? ?Past Medical History:  ?Diagnosis Date  ? BRCA negative   ? Breast cancer ([place]) 2013  ? RT LUMPECTOMY with chemo and rad tx  ? Degenerative disc disease, lumbar   ? Personal history of chemotherapy 2013  ? for breast ca  ? Personal history of radiation therapy 2013  ? F/U right breast cancer   ? Radiation 2013  ? BREAST CA  ? Status post chemotherapy 2013  ? BREAST CA  ? ?Past Surgical History:  ?Procedure Laterality Date  ? BREAST BIOPSY Right 07/2011  ? invasive ductal carcinoma  ? BREAST LUMPECTOMY Right 08/2011  ? 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 RAd and chemo tx  ? BREAST SURGERY    ? mastectomy Right   ? partial with lymph node dissection  ? PORT A CATH REVISION    ? PORT-A-CATH REMOVAL  11-07-15  ? Dr Bary Castilla  ? SHOULDER ARTHROSCOPY WITH ROTATOR CUFF REPAIR Left 05/18/2019  ? Procedure: SHOULDER ARTHROSCOPY WITH SUBACROMIAL DECOMPRESSION AND DISTAL CLAVICAL EXCISION, INTRAARTICULAR DEBRIDEMENT;  Surgeon: Lovell Sheehan, MD;  Location: [place];  Service: Orthopedics;  Laterality: Left;  ? [place] SURGERY  2008  ? Dr Joya Salm  ? TUBAL LIGATION    ? ?Patient Active Problem List  ? Diagnosis Date Noted  ? Cervical radicular pain 07/24/2021  ? Spinal stenosis in cervical region 07/24/2021  ? Chronic pain syndrome 07/24/2021  ? Cervical spondylosis with radiculopathy 02/25/2021  ? Mass of soft tissue of upper arm 01/31/2021  ? Elevated blood-pressure reading, without  diagnosis of hypertension 08/26/2020  ? Menorrhagia with irregular cycle 04/02/2020  ? Rotator cuff impingement syndrome, left 12/15/2018  ? Cervical myofascial pain syndrome 02/14/2016  ? History of breast cancer 11/07/2015  ? Postlaminectomy syndrome, lumbar region 04/12/2012  ? Chemotherapy-induced neuropathy ([place]) 04/12/2012  ? 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 ([place]) 08/07/2011  ? ? ?PCP: Juline Patch, MD ? ?REFERRING PROVIDER: Meade Maw, MD ? 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 ?THERAPY DIAG: Cervicalgia ? ?ONSET DATE: 10/01/2020 (approximate) ? ?FOLLOW UP APPT WITH PROVIDER: Yes, pt was advised to follow-up 6 weeks after her initial consult with neurosurgery on 12/11/21 ? ?From initial evaluation 11/11/21 ? ?SUBJECTIVE:                                                                                                                                                                                        ? ?  Chief Complaint: Neck pain ? ?Pertinent History ?Pt reports that she started having neck and bilateral shoulder pain around March of 2022. She was seen by this author in September 2022 for one evaluation and two follow-up treatment visits without any benefit. Treatment was extremely limited at that time due to highly irritable pain. With respect to the neck/shoulder pain pt report no known traumatic. Per medical record pt with known cervical spondylosis. No history of cervical surgery. Pain radiates into her L shoulder and LUE. She has a history of L shoulder arthroscopy with rotator cuff repair on 05/18/2019 with Dr. Harlow Mares. Prior to the surgery pt reports severe L shoulder pain for 6-8 months. Previous L shoulder MRI with evidence of supraspinatus tendinosis and detached long head of the biceps with denervation edema and mild atrophy. She saw Dr. Zigmund Daniel in 2022 who started her on  duloxetine previously helped with the pain but was discontinued. She has also taken diclofenac in the past but that has been discontinued. She continues taking a prescription narcotic which is prescribed to help with her chronic back pain. Pt has a history of 2 low back surgeries as well as a history of breast cancer with chemotherapy-induced polyneuropathy affecting the plantar surface of both feet. She has had cervical injections for her pain. The first injection resulted in pain relief which lasted approximately 3 months. However the last injection in February 2023 only offered 5 hours of relief followed by a return of her symptoms. She has consulted with neurosurgery who has recommended a 3 level anterior cervical discectomy and fusion between C4-C7.  Prior to doing that he would like for her to continue with physical therapy.  ? ?Pain:  ?Pain Intensity: Present: 6/10, Best: 6/10, Worst: 9/10 ?Pain location: L trapezius ridge radiating down the LUE all the way to the left hand ?Pain Quality: stabbing  ?Radiating: Yes, L trapezius ridge radiating down the LUE all the way to the left hand ?Numbness/Tingling: Yes, numbness in LUE digits 3 and 4 as well as thenar eminence. Pt reports negative left carpal tunnel testing in the past. ?Focal Weakness: Yes, weakness in left grip and weakness with LUE overhead lifting ?Aggravating factors: LUE lifting, turning head in all directions ?Relieving factors: lay on back, no help with narcotics or other pain medication ?24-hour pain behavior: varies throughout the day depending on activity level ?History of prior neck injury, pain, surgery, or therapy: Yes, previously treated at this clinic for these symptoms without benefit (eval, 2 treatments) ?Falls: Has patient fallen in last 6 months? No ?Dominant hand: left ?Imaging: Yes, ?C spine xrays 01/31/21: Mid cervical degenerative changes.  Loss of cervical lordosis.    ? ?MRI C spine 04/29/2021 ?1. Disc degeneration with reversal of  cervical lordosis that has  ?progressed from a 2011 myelogram.  ?2. Disc protrusions contact the ventral cord at C5-6 and C6-7.  ?Diffusely patent spinal canal.  ?3. Right foraminal impingement at C4-5 and especially C5-6.  ?4. Moderate left foraminal impingement at C5-6.  ? 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 Positive for unrelenting pain and history of breast cancer ? ?Precautions: None ? ?Weight Bearing Restrictions: No ? ?Living  Environment ?Lives with: lives with their family and lives with their spouse ?Lives in: House/apartment, no functional limitations at home ? ? ?Patient Goals: Decrease pain and be able to work without pain ? ? ?OBJECTIVE:  ? ?Patient Surveys  ?FOTO: 35, predicted improvement to 53 ?NDI: 36% ? ?Cognition ?Patient is oriented to person, place, and time.  ?Recent memory is intact.  ?Remote memory is intact.  ?Attention span and concentration are intact.  ?Expressive speech is intact.  ?Patient's fund of knowledge is within normal limits for educational level. ?   ?Gross Musculoskeletal Assessment ?Tremor: None ?Bulk: Normal ?Tone: Normal ? ?Gait ?No gross deficits noted in gait with the exception of self-limiting spontaneous head turns ? ?Posture ?Pt sits with upright posture, very guarded, loss of normal cervical lordotic curve in sitting; ? ?AROM ? ?AROM (Normal range in degrees) AROM ?12/01/2021  ?Cervical  ?Flexion (50) 15*  ?Extension (80) 10*  ?Right lateral flexion (45) 10*  ?Left lateral flexion (45) 12*  ?Right rotation (85) 25*  ?Left rotation (85) 19*  ?(* = pain; Blank rows = not tested) ? ?Pt  brings shoulders to about 90 degrees of flexion, abduction of R shoulder is 90, L is about 70, limited by pain, No limitations in elbows, wrists, or hands however all LUE AROM is painful ? ?PROM ?Deferred s

## 2021-12-15 DIAGNOSIS — L650 Telogen effluvium: Secondary | ICD-10-CM | POA: Diagnosis not present

## 2021-12-15 DIAGNOSIS — L648 Other androgenic alopecia: Secondary | ICD-10-CM | POA: Diagnosis not present

## 2021-12-16 ENCOUNTER — Encounter: Payer: Self-pay | Admitting: Family Medicine

## 2021-12-17 ENCOUNTER — Encounter: Payer: Self-pay | Admitting: Family Medicine

## 2021-12-17 ENCOUNTER — Ambulatory Visit (INDEPENDENT_AMBULATORY_CARE_PROVIDER_SITE_OTHER): Payer: BC Managed Care – PPO | Admitting: Family Medicine

## 2021-12-17 VITALS — BP 120/80 | HR 76 | Ht 63.0 in | Wt 143.0 lb

## 2021-12-17 DIAGNOSIS — B3731 Acute candidiasis of vulva and vagina: Secondary | ICD-10-CM | POA: Diagnosis not present

## 2021-12-17 MED ORDER — FLUCONAZOLE 150 MG PO TABS
150.0000 mg | ORAL_TABLET | Freq: Once | ORAL | 0 refills | Status: AC
Start: 2021-12-17 — End: 2021-12-17

## 2021-12-17 NOTE — Progress Notes (Signed)
? ? ?Date:  12/17/2021  ? ?Name:  Elizabeth Torres   DOB:  18-Aug-1975   MRN:  [id number] ? ? ?Chief Complaint: Vaginitis (Itching after sex) ? ?Vaginal Discharge ?The patient's primary symptoms include genital itching and vaginal discharge. The patient's pertinent negatives include no genital lesions, genital odor, genital rash, missed menses, pelvic pain or vaginal bleeding. This is a new problem. The current episode started in the past 7 days. The problem occurs constantly. The problem has been gradually worsening. The patient is experiencing no pain. Pertinent negatives include no abdominal pain, back pain, chills, constipation, diarrhea, dysuria, fever, flank pain, frequency, headaches, hematuria, nausea, rash, sore throat or urgency. The vaginal discharge was clear and thick. There has been no bleeding.  ? ?Lab Results  ?Component Value Date  ? NA 139 07/22/2021  ? K 4.7 07/22/2021  ? CO2 23 07/22/2021  ? GLUCOSE 88 07/22/2021  ? BUN 15 07/22/2021  ? CREATININE 0.78 07/22/2021  ? CALCIUM 9.3 07/22/2021  ? EGFR 96 07/22/2021  ? GFRNONAA 107 01/26/2020  ? ?Lab Results  ?Component Value Date  ? CHOL 159 10/22/2020  ? HDL 58 10/22/2020  ? [place] 86 10/22/2020  ? TRIG 77 10/22/2020  ? ?Lab Results  ?Component Value Date  ? TSH 2.360 07/22/2021  ? ?No results found for: HGBA1C ?Lab Results  ?Component Value Date  ? WBC 8.1 07/22/2021  ? HGB 14.0 07/22/2021  ? HCT 41.0 07/22/2021  ? MCV 95 07/22/2021  ? PLT 274 07/22/2021  ? ?Lab Results  ?Component Value Date  ? ALT 26 01/26/2020  ? AST 23 01/26/2020  ? ALKPHOS 57 01/26/2020  ? BILITOT 0.3 01/26/2020  ? ?No results found for: 25OHVITD2, [place], VD25OH  ? ?Review of Systems  ?Constitutional:  Negative for chills and fever.  ?HENT:  Negative for drooling, ear discharge, ear pain and sore throat.   ?Respiratory:  Negative for cough, shortness of breath and wheezing.   ?Cardiovascular:  Negative for chest pain, palpitations and leg swelling.  ?Gastrointestinal:  Negative  for abdominal pain, blood in stool, constipation, diarrhea and nausea.  ?Endocrine: Negative for polydipsia.  ?Genitourinary:  Positive for vaginal discharge. Negative for dysuria, flank pain, frequency, hematuria, missed menses, pelvic pain and urgency.  ?Musculoskeletal:  Negative for back pain, myalgias and neck pain.  ?Skin:  Negative for rash.  ?Allergic/Immunologic: Negative for environmental allergies.  ?Neurological:  Negative for dizziness and headaches.  ?Hematological:  Does not bruise/bleed easily.  ?Psychiatric/Behavioral:  Negative for suicidal ideas. The patient is not nervous/anxious.   ? ?Patient Active Problem List  ? Diagnosis Date Noted  ? Cervical radicular pain 07/24/2021  ? Spinal stenosis in cervical region 07/24/2021  ? Chronic pain syndrome 07/24/2021  ? Cervical spondylosis with radiculopathy 02/25/2021  ? Mass of soft tissue of upper arm 01/31/2021  ? Elevated blood-pressure reading, without diagnosis of hypertension 08/26/2020  ? Menorrhagia with irregular cycle 04/02/2020  ? Rotator cuff impingement syndrome, left 12/15/2018  ? Cervical myofascial pain syndrome 02/14/2016  ? History of breast cancer 11/07/2015  ? Postlaminectomy syndrome, lumbar region 04/12/2012  ? Chemotherapy-induced neuropathy ([place]) 04/12/2012  ? 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 ([place]) 08/07/2011  ? ? ?Allergies  ?Allergen Reactions  ? Penicillins Anaphylaxis  ? Pregabalin Other (See Comments)  ?  Thoughts of self harm  ? Sulfa Antibiotics Anaphylaxis  ? Ondansetron   ?  migraines  ? Other   ? Zofran [Ondansetron Hcl]   ?  migraines  ? ? ?Past Surgical History:  ?Procedure Laterality Date  ? BREAST BIOPSY Right 07/2011  ? invasive ductal carcinoma  ? BREAST LUMPECTOMY Right 08/2011  ? 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 RAd and chemo tx  ? BREAST SURGERY    ? mastectomy Right   ? partial with lymph node dissection  ? PORT A CATH REVISION    ? PORT-A-CATH REMOVAL  11-07-15  ?  Dr Bary Castilla  ? SHOULDER ARTHROSCOPY WITH ROTATOR CUFF REPAIR Left 05/18/2019  ? Procedure: SHOULDER ARTHROSCOPY WITH SUBACROMIAL DECOMPRESSION AND DISTAL CLAVICAL EXCISION, INTRAARTICULAR DEBRIDEMENT;  Surgeon: Lovell Sheehan, MD;  Location: [facility];  Service: Orthopedics;  Laterality: Left;  ? [place] SURGERY  2008  ? Dr Joya Salm  ? TUBAL LIGATION    ? ? ?Social History  ? ?Tobacco Use  ? Smoking status: Former  ?  Packs/day: 1.00  ?  Years: 20.00  ?  Pack years: 20.00  ?  Types: Cigarettes  ?  Quit date: 01/08/2020  ?  Years since quitting: 1.9  ? Smokeless tobacco: Never  ?Vaping Use  ? Vaping Use: Never used  ?Substance Use Topics  ? Alcohol use: Not Currently  ? Drug use: Never  ? ? ? ?Medication list has been reviewed and updated. ? ?Current Meds  ?Medication Sig  ? hydrochlorothiazide (HYDRODIURIL) 12.5 MG tablet TAKE 1 TABLET(12.5 MG) BY MOUTH DAILY  ? HYDROcodone-acetaminophen (NORCO) 10-325 MG tablet Take 1 tablet by mouth 5 (five) times daily as needed.  ? ibuprofen (ADVIL) 600 MG tablet Take 600 mg by mouth every 6 (six) hours as needed.  ? methocarbamol (ROBAXIN) 500 MG tablet TAKE 1 TABLET(500 MG) BY MOUTH TWICE DAILY AS NEEDED FOR MUSCLE SPASMS  ? minoxidil (LONITEN) 2.5 MG tablet Take 1.25 mg by mouth 2 (two) times daily.  ? nortriptyline (PAMELOR) 10 MG capsule TAKE 3 CAPSULES(30 MG) BY MOUTH AT BEDTIME  ? Vitamin D, Ergocalciferol, (DRISDOL) 1.25 MG (50000 UNIT) CAPS capsule Take 50,000 Units by mouth once a week.  ? zolpidem (AMBIEN) 10 MG tablet TAKE 1 TABLET BY MOUTH AT BEDTIME AS NEEDED FOR INSOMNIA  ? ? ? ?  12/17/2021  ? 10:43 AM 07/22/2021  ?  8:00 AM 04/21/2021  ? 11:30 AM 03/10/2021  ? 10:54 AM  ?GAD 7 : Generalized Anxiety Score  ?Nervous, Anxious, on Edge 0 0 0 0  ?Control/stop worrying 0 0 0 0  ?Worry too much - different things 1 0 0 0  ?Trouble relaxing 0 0 1 0  ?Restless 0 0 0 0  ?Easily annoyed or irritable 0 0 0 0  ?Afraid - awful might happen 1 0 0 0  ?Total GAD 7 Score 2 0 1 0   ?Anxiety Difficulty Not difficult at all  Not difficult at all Not difficult at all  ? ? ? ?  12/17/2021  ? 10:42 AM  ?Depression screen PHQ 2/9  ?Decreased Interest 0  ?Down, Depressed, Hopeless 0  ?PHQ - 2 Score 0  ?Altered sleeping 1  ?Tired, decreased energy 1  ?Change in appetite 0  ?Feeling bad or failure about yourself  0  ?Trouble concentrating 0  ?Moving slowly or fidgety/restless 0  ?Suicidal thoughts 0  ?PHQ-9 Score 2  ?Difficult doing work/chores Not difficult at all  ? ? ?BP Readings from Last 3 Encounters:  ?12/17/21 120/80  ?11/12/21 121/84  ?10/21/21 121/84  ? ? ?Physical Exam ?Vitals and nursing note reviewed.  ?Constitutional:   ?  Appearance: She is well-developed.  ?HENT:  ?   Head: Normocephalic.  ?   Right Ear: Tympanic membrane, ear canal and external ear normal.  ?   Left Ear: Tympanic membrane, ear canal and external ear normal.  ?Eyes:  ?   General: Lids are everted, no foreign bodies appreciated. No scleral icterus.    ?   Left eye: No foreign body or hordeolum.  ?   Conjunctiva/sclera: Conjunctivae normal.  ?   Right eye: Right conjunctiva is not injected.  ?   Left eye: Left conjunctiva is not injected.  ?   Pupils: Pupils are equal, round, and reactive to light.  ?Neck:  ?   Thyroid: No thyromegaly.  ?   Vascular: No JVD.  ?   Trachea: No tracheal deviation.  ?Cardiovascular:  ?   Rate and Rhythm: Normal rate and regular rhythm.  ?   Heart sounds: Normal heart sounds. No murmur heard. ?  No friction rub. No gallop.  ?Pulmonary:  ?   Effort: Pulmonary effort is normal. No respiratory distress.  ?   Breath sounds: Normal breath sounds. No wheezing or rales.  ?Abdominal:  ?   General: Bowel sounds are normal.  ?   Palpations: Abdomen is soft. There is no mass.  ?   Tenderness: There is no abdominal tenderness. There is no guarding or rebound.  ?Musculoskeletal:     ?   General: No tenderness. Normal range of motion.  ?   Cervical back: Normal range of motion and neck supple.   ?Lymphadenopathy:  ?   Cervical: No cervical adenopathy.  ?Skin: ?   General: Skin is warm.  ?   Findings: No rash.  ?Neurological:  ?   Mental Status: She is alert and oriented to person, place, and time.  ?   C

## 2021-12-20 ENCOUNTER — Other Ambulatory Visit: Payer: Self-pay | Admitting: Physical Medicine & Rehabilitation

## 2022-01-09 ENCOUNTER — Encounter: Payer: BC Managed Care – PPO | Admitting: Registered Nurse

## 2022-01-12 ENCOUNTER — Ambulatory Visit
Admission: EM | Admit: 2022-01-12 | Discharge: 2022-01-12 | Disposition: A | Discharge status: Home / Self Care | Payer: BC Managed Care – PPO | Attending: Emergency Medicine | Admitting: Emergency Medicine

## 2022-01-12 DIAGNOSIS — J0100 Acute maxillary sinusitis, unspecified: Secondary | ICD-10-CM

## 2022-01-12 DIAGNOSIS — R051 Acute cough: Secondary | ICD-10-CM | POA: Diagnosis not present

## 2022-01-12 MED ORDER — PROMETHAZINE-DM 6.25-15 MG/5ML PO SYRP: 5.0000 mL | ORAL_SOLUTION | Freq: Four times a day (QID) | ORAL | 0 refills | Status: DC | PRN

## 2022-01-12 MED ORDER — IPRATROPIUM BROMIDE 0.06 % NA SOLN: 2.0000 | Freq: Four times a day (QID) | NASAL | 12 refills | Status: DC

## 2022-01-12 MED ORDER — DOXYCYCLINE HYCLATE 100 MG PO CAPS: 100.0000 mg | ORAL_CAPSULE | Freq: Two times a day (BID) | ORAL | 0 refills | Status: DC

## 2022-01-12 MED ORDER — BENZONATATE 100 MG PO CAPS: 200.0000 mg | ORAL_CAPSULE | Freq: Three times a day (TID) | ORAL | 0 refills | Status: DC

## 2022-01-12 NOTE — Discharge Instructions (Signed)
The Doxycycline twice daily with food for 10 days for treatment of your sinusitis.  Perform sinus irrigation 2-3 times a day with a NeilMed sinus rinse kit and distilled water.  Do not use tap water.  You can use plain over-the-counter Mucinex every 6 hours to break up the stickiness of the mucus so your body can clear it.  Increase your oral fluid intake to thin out your mucus so that is also able for your body to clear more easily.  Take an over-the-counter probiotic, such as Culturelle-align-activia, 1 hour after each dose of antibiotic to prevent diarrhea.  Use the Atrovent nasal spray, 2 squirts in each nostril every 6 hours, as needed for runny nose and postnasal drip.  Use the Tessalon Perles every 8 hours during the day.  Take them with a small sip of water.  They may give you some numbness to the base of your tongue or a metallic taste in your mouth, this is normal.  Use the Promethazine DM cough syrup at bedtime for cough and congestion.  It will make you drowsy so do not take it during the day.  If you develop any new or worsening symptoms return for reevaluation or see your primary care provider.  

## 2022-01-12 NOTE — ED Triage Notes (Signed)
C/o head congestion and cough x 1 week.

## 2022-01-12 NOTE — ED Provider Notes (Signed)
[facility]    CSN: [id number] Arrival date & time: 01/12/22  6734      History   Chief Complaint Chief Complaint  Patient presents with   Nasal Congestion   Headache   Cough    HPI KHAMIYAH Torres is a 46 y.o. female.   HPI  46 year old female here for evaluation of respiratory complaints.  Patient reports that for the last week she has been experiencing nasal congestion with intense sinus pressure in her cheeks and behind her eyes.  Her discharge has been green in color.  She also endorses some mild sore throat and a cough that is productive.  She states she does not look at the sputum she produces but she does not spit it out.  She also endorses that she gets short of breath easier than usual.  She has not had any fever, ear pain, or wheezing.  She is unaware of any other sick contacts with similar symptoms.  Past Medical History:  Diagnosis Date   BRCA negative    Breast cancer ([place]) 2013   RT LUMPECTOMY with chemo and rad tx   Degenerative disc disease, lumbar    Personal history of chemotherapy 2013   for breast ca   Personal history of radiation therapy 2013   F/U right breast cancer    Radiation 2013   BREAST CA   Status post chemotherapy 2013   BREAST CA    Patient Active Problem List   Diagnosis Date Noted   Cervical radicular pain 07/24/2021   Spinal stenosis in cervical region 07/24/2021   Chronic pain syndrome 07/24/2021   Cervical spondylosis with radiculopathy 02/25/2021   Mass of soft tissue of upper arm 01/31/2021   Elevated blood-pressure reading, without diagnosis of hypertension 08/26/2020   Menorrhagia with irregular cycle 04/02/2020   Rotator cuff impingement syndrome, left 12/15/2018   Cervical myofascial pain syndrome 02/14/2016   History of breast cancer 11/07/2015   Postlaminectomy syndrome, lumbar region 04/12/2012   Chemotherapy-induced neuropathy ([place]) 04/12/2012   Primary cancer of lower outer quadrant of right female  breast ([place]) 08/07/2011    Past Surgical History:  Procedure Laterality Date   BREAST BIOPSY Right 07/2011   invasive ductal carcinoma   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 RAd and chemo tx   BREAST SURGERY     mastectomy Right    partial with lymph node dissection   PORT A CATH REVISION     PORT-A-CATH REMOVAL  11-07-15   Dr Bary Castilla   SHOULDER ARTHROSCOPY WITH ROTATOR CUFF REPAIR Left 05/18/2019   Procedure: SHOULDER ARTHROSCOPY WITH SUBACROMIAL DECOMPRESSION AND DISTAL CLAVICAL EXCISION, INTRAARTICULAR DEBRIDEMENT;  Surgeon: Lovell Sheehan, MD;  Location: [place];  Service: Orthopedics;  Laterality: Left;   SPINE SURGERY  2008   Dr Joya Salm   TUBAL LIGATION      OB History     Gravida  3   Para      Term      Preterm      AB      Living  3      SAB      IAB      Ectopic      Multiple      Live Births               Home Medications    Prior to Admission medications   Medication Sig Start Date End Date Taking? Authorizing  Provider  benzonatate (TESSALON) 100 MG capsule Take 2 capsules (200 mg total) by mouth every 8 (eight) hours. 01/12/22  Yes Margarette Canada, NP  doxycycline (VIBRAMYCIN) 100 MG capsule Take 1 capsule (100 mg total) by mouth 2 (two) times daily. 01/12/22  Yes Margarette Canada, NP  ipratropium (ATROVENT) 0.06 % nasal spray Place 2 sprays into both nostrils 4 (four) times daily. 01/12/22  Yes Margarette Canada, NP  promethazine-dextromethorphan (PROMETHAZINE-DM) 6.25-15 MG/5ML syrup Take 5 mLs by mouth 4 (four) times daily as needed. 01/12/22  Yes Margarette Canada, NP  hydrochlorothiazide (HYDRODIURIL) 12.5 MG tablet TAKE 1 TABLET(12.5 MG) BY MOUTH DAILY 07/22/21   Juline Patch, MD  HYDROcodone-acetaminophen (NORCO) 10-325 MG tablet Take 1 tablet by mouth 5 (five) times daily as needed. 11/12/21   Bayard Hugger, NP  ibuprofen (ADVIL) 600 MG tablet Take 600 mg by mouth every 6 (six) hours as  needed.    [provider]  methocarbamol (ROBAXIN) 500 MG tablet TAKE 1 TABLET(500 MG) BY MOUTH TWICE DAILY AS NEEDED FOR MUSCLE SPASMS 10/21/21   Bayard Hugger, NP  minoxidil (LONITEN) 2.5 MG tablet Take 1.25 mg by mouth 2 (two) times daily. 09/17/21   [provider]  nortriptyline (PAMELOR) 10 MG capsule Take 3 capsules (30 mg total) by mouth at bedtime. 12/22/21   Bayard Hugger, NP  Vitamin D, Ergocalciferol, (DRISDOL) 1.25 MG (50000 UNIT) CAPS capsule Take 50,000 Units by mouth once a week. 09/25/21   [provider]  zolpidem (AMBIEN) 10 MG tablet TAKE 1 TABLET BY MOUTH AT BEDTIME AS NEEDED FOR INSOMNIA 10/21/21   Bayard Hugger, NP    Family History Family History  Problem Relation Age of Onset   Diabetes Mother    Hypertension Mother    Pulmonary fibrosis Mother    Cancer Paternal Uncle    Breast cancer Paternal Uncle        61'S   Breast cancer Paternal Aunt        42'S   Other Father        unknown medical history    Social History Social History   Tobacco Use   Smoking status: Former    Packs/day: 1.00    Years: 20.00    Total pack years: 20.00    Types: Cigarettes    Quit date: 01/08/2020    Years since quitting: 2.0   Smokeless tobacco: Never  Vaping Use   Vaping Use: Never used  Substance Use Topics   Alcohol use: Not Currently   Drug use: Never     Allergies   Penicillins, Pregabalin, Sulfa antibiotics, Ondansetron, Other, and Zofran [ondansetron hcl]   Review of Systems Review of Systems  Constitutional:  Negative for fever.  HENT:  Positive for congestion, sinus pressure and sore throat. Negative for ear pain.   Respiratory:  Positive for cough and shortness of breath. Negative for wheezing.   Hematological: Negative.   Psychiatric/Behavioral: Negative.       Physical Exam Triage Vital Signs ED Triage Vitals [01/12/22 1009]  Enc Vitals Group     BP 113/84     Pulse Rate 87     Resp 18     Temp 98.7 F (37.1  C)     Temp Source Oral     SpO2 100 %     Weight      Height      Head Circumference      Peak Flow  Pain Score      Pain Loc      Pain Edu?      Excl. in [place]?    No data found.  Updated Vital Signs BP 113/84 (BP Location: Left Arm)   Pulse 87   Temp 98.7 F (37.1 C) (Oral)   Resp 18   SpO2 100%   Visual Acuity Right Eye Distance:   Left Eye Distance:   Bilateral Distance:    Right Eye Near:   Left Eye Near:    Bilateral Near:     Physical Exam Vitals and nursing note reviewed.  Constitutional:      Appearance: Normal appearance. She is not ill-appearing.  HENT:     Head: Normocephalic and atraumatic.     Right Ear: Tympanic membrane, ear canal and external ear normal. There is no impacted cerumen.     Left Ear: Tympanic membrane, ear canal and external ear normal. There is no impacted cerumen.     Nose: Congestion and rhinorrhea present.     Mouth/Throat:     Mouth: Mucous membranes are moist.     Pharynx: Oropharynx is clear. Posterior oropharyngeal erythema present.  Cardiovascular:     Rate and Rhythm: Normal rate and regular rhythm.     Pulses: Normal pulses.     Heart sounds: Normal heart sounds. No murmur heard.    No friction rub. No gallop.  Pulmonary:     Effort: Pulmonary effort is normal.     Breath sounds: Normal breath sounds. No wheezing, rhonchi or rales.  Musculoskeletal:     Cervical back: Normal range of motion and neck supple.  Lymphadenopathy:     Cervical: No cervical adenopathy.  Skin:    General: Skin is warm and dry.     Capillary Refill: Capillary refill takes less than 2 seconds.     Findings: No erythema or rash.  Neurological:     General: No focal deficit present.     Mental Status: She is alert and oriented to person, place, and time.  Psychiatric:        Mood and Affect: Mood normal.        Behavior: Behavior normal.        Thought Content: Thought content normal.        Judgment: Judgment normal.      UC  Treatments / Results  Labs (all labs ordered are listed, but only abnormal results are displayed) Labs Reviewed - No data to display  EKG   Radiology No results found.  Procedures Procedures (including critical care time)  Medications Ordered in UC Medications - No data to display  Initial Impression / Assessment and Plan / UC Course  I have reviewed the triage vital signs and the nursing notes.  Pertinent labs & imaging results that were available during my care of the patient were reviewed by me and considered in my medical decision making (see chart for details).  Patient is a very pleasant, nontoxic-appearing 46 year old female here for evaluation of sinus complaints as outlined in HPI above.  On exam she has pearly-gray tympanic membranes bilaterally with normal light reflex and clear external auditory canals.  Her nasal mucosa is markedly edematous and erythematous with thick yellow discharge in both nares.  She does have marked tenderness to percussion of bilateral maxillary sinuses.  Oropharyngeal exam reveals posterior oropharyngeal erythema with yellow postnasal drip.  No anterior cervical lymphadenopathy appreciated exam.  Cardiopulmonary exam reveals S1-S2 heart sounds with regular rate and  rhythm.  Patient initially had some wheezes with expiration in her right upper lobe posteriorly.  These wheezes cleared with a cough.  The remainder of her lung fields were clear to auscultation.  Patient exam is consistent with acute maxillary sinusitis and I believe that her cough is being driven by postnasal drip and sinus drainage.  I will treat her with doxycycline as she has an anaphylactic reaction to penicillins.  This will be twice daily for 10 days.  Of also encouraged her to increase oral fluid intake keep her mucus thin, use over-the-counter Mucinex, and perform sinus irrigation.  I will also prescribe Atrovent nasal spray to help with the nasal congestion and Tessalon Perles to help  with cough today and Promethazine DM cough syrup to help with cough at nighttime.  Return precautions reviewed.   Final Clinical Impressions(s) / UC Diagnoses   Final diagnoses:  Acute non-recurrent maxillary sinusitis  Acute cough     Discharge Instructions      The Doxycycline twice daily with food for 10 days for treatment of your sinusitis.  Perform sinus irrigation 2-3 times a day with a NeilMed sinus rinse kit and distilled water.  Do not use tap water.  You can use plain over-the-counter Mucinex every 6 hours to break up the stickiness of the mucus so your body can clear it.  Increase your oral fluid intake to thin out your mucus so that is also able for your body to clear more easily.  Take an over-the-counter probiotic, such as Culturelle-align-activia, 1 hour after each dose of antibiotic to prevent diarrhea.  Use the Atrovent nasal spray, 2 squirts in each nostril every 6 hours, as needed for runny nose and postnasal drip.  Use the Tessalon Perles every 8 hours during the day.  Take them with a small sip of water.  They may give you some numbness to the base of your tongue or a metallic taste in your mouth, this is normal.  Use the Promethazine DM cough syrup at bedtime for cough and congestion.  It will make you drowsy so do not take it during the day.  If you develop any new or worsening symptoms return for reevaluation or see your primary care provider.      ED Prescriptions     Medication Sig Dispense Auth. Provider   doxycycline (VIBRAMYCIN) 100 MG capsule Take 1 capsule (100 mg total) by mouth 2 (two) times daily. 20 capsule Margarette Canada, NP   benzonatate (TESSALON) 100 MG capsule Take 2 capsules (200 mg total) by mouth every 8 (eight) hours. 21 capsule Margarette Canada, NP   ipratropium (ATROVENT) 0.06 % nasal spray Place 2 sprays into both nostrils 4 (four) times daily. 15 mL Margarette Canada, NP   promethazine-dextromethorphan (PROMETHAZINE-DM) 6.25-15 MG/5ML  syrup Take 5 mLs by mouth 4 (four) times daily as needed. 118 mL Margarette Canada, NP      PDMP not reviewed this encounter.   Margarette Canada, NP 01/12/22 1041

## 2022-01-13 ENCOUNTER — Encounter: Payer: Self-pay | Admitting: Physical Medicine & Rehabilitation

## 2022-01-13 ENCOUNTER — Encounter
Payer: BC Managed Care – PPO | Attending: Physical Medicine and Rehabilitation | Admitting: Physical Medicine & Rehabilitation

## 2022-01-13 VITALS — BP 111/79 | HR 94 | Ht 63.0 in | Wt 143.4 lb

## 2022-01-13 DIAGNOSIS — G894 Chronic pain syndrome: Secondary | ICD-10-CM

## 2022-01-13 DIAGNOSIS — Z5181 Encounter for therapeutic drug level monitoring: Secondary | ICD-10-CM | POA: Diagnosis not present

## 2022-01-13 DIAGNOSIS — M7542 Impingement syndrome of left shoulder: Secondary | ICD-10-CM | POA: Diagnosis not present

## 2022-01-13 DIAGNOSIS — M5412 Radiculopathy, cervical region: Secondary | ICD-10-CM | POA: Diagnosis not present

## 2022-01-13 DIAGNOSIS — Z79891 Long term (current) use of opiate analgesic: Secondary | ICD-10-CM | POA: Diagnosis not present

## 2022-01-13 DIAGNOSIS — M961 Postlaminectomy syndrome, not elsewhere classified: Secondary | ICD-10-CM | POA: Diagnosis not present

## 2022-01-13 MED ORDER — HYDROCODONE-ACETAMINOPHEN 10-325 MG PO TABS
1.0000 | ORAL_TABLET | Freq: Every day | ORAL | 0 refills | Status: DC | PRN
Start: 2022-01-13 — End: 2022-02-10

## 2022-01-13 NOTE — Progress Notes (Signed)
 Subjective:    Patient ID: Elizabeth Torres, female    DOB: 10/06/1975, 46 y.o.   MRN: [id number]  HPI  46 yo female with Chronic pain syndrome now with primary c/o of Left cervical radiculopathy.   The patient has seen Dr. Latif for cervical epidural which was not helpful.  She is now seeing Dr. Yarborough from neurosurgery at [facility] who is recommending C3-C7 ACDF.  The patient is completing outpatient physical therapy and plans to follow-up with neurosurgery in around 6 weeks to decide whether or not to pursue cervical fusion Pain Inventory Average Pain 5 Pain Right Now 5 My pain is constant and aching  In the last 24 hours, has pain interfered with the following? General activity 3 Relation with others 3 Enjoyment of life 3 What TIME of day is your pain at its worst? daytime Sleep (in general) Fair  Pain is worse with: inactivity Pain improves with: rest, heat/ice, and medication Relief from Meds: 7  Family History  Problem Relation Age of Onset   Diabetes Mother    Hypertension Mother    Pulmonary fibrosis Mother    Cancer Paternal Uncle    Breast cancer Paternal Uncle        40'S   Breast cancer Paternal Aunt        60'S   Other Father        unknown medical history   Social History   Socioeconomic History   Marital status: Married    Spouse name: Cliff Glace   Number of children: 3   Years of education: Not on file   Highest education level: Not on file  Occupational History   Not on file  Tobacco Use   Smoking status: Former    Packs/day: 1.00    Years: 20.00    Total pack years: 20.00    Types: Cigarettes    Quit date: 01/08/2020    Years since quitting: 2.0   Smokeless tobacco: Never  Vaping Use   Vaping Use: Never used  Substance and Sexual Activity   Alcohol use: Not Currently   Drug use: Never   Sexual activity: Yes    Partners: Male  Other Topics Concern   Not on file  Social History Narrative   Not on file   Social Determinants of  Health   Financial Resource Strain: Not on file  Food Insecurity: Not on file  Transportation Needs: Not on file  Physical Activity: Not on file  Stress: Not on file  Social Connections: Not on file   Past Surgical History:  Procedure Laterality Date   BREAST BIOPSY Right 07/2011   invasive ductal carcinoma   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 RAd and chemo tx   BREAST SURGERY     mastectomy Right    partial with lymph node dissection   PORT A CATH REVISION     PORT-A-CATH REMOVAL  11-07-15   Dr Byrnett   SHOULDER ARTHROSCOPY WITH ROTATOR CUFF REPAIR Left 05/18/2019   Procedure: SHOULDER ARTHROSCOPY WITH SUBACROMIAL DECOMPRESSION AND DISTAL CLAVICAL EXCISION, INTRAARTICULAR DEBRIDEMENT;  Surgeon: Bowers, James R, MD;  Location: [facility];  Service: Orthopedics;  Laterality: Left;   SPINE SURGERY  2008   Dr Botero   TUBAL LIGATION     Past Surgical History:  Procedure Laterality Date   BREAST BIOPSY Right 07/2011   invasive ductal carcinoma   BREAST LUMPECTOMY Right 08/2011   invasive ductal carcinoma   and DCIS. Margins clear. RAd and chemo tx   BREAST SURGERY     mastectomy Right    partial with lymph node dissection   PORT A CATH REVISION     PORT-A-CATH REMOVAL  11-07-15   Dr Bary Castilla   SHOULDER ARTHROSCOPY WITH ROTATOR CUFF REPAIR Left 05/18/2019   Procedure: SHOULDER ARTHROSCOPY WITH SUBACROMIAL DECOMPRESSION AND DISTAL CLAVICAL EXCISION, INTRAARTICULAR DEBRIDEMENT;  Surgeon: Lovell Sheehan, MD;  Location: [facility];  Service: Orthopedics;  Laterality: Left;   SPINE SURGERY  2008   Dr Joya Salm   TUBAL LIGATION     Past Medical History:  Diagnosis Date   BRCA negative    Breast cancer ([place]) 2013   RT LUMPECTOMY with chemo and rad tx   Degenerative disc disease, lumbar    Personal history of chemotherapy 2013   for breast ca   Personal history of radiation therapy 2013   F/U right breast cancer     Radiation 2013   BREAST CA   Status post chemotherapy 2013   BREAST CA   BP 111/79   Pulse 94   Ht 5' 3" (1.6 m)   Wt 143 lb 6.4 oz (65 kg)   SpO2 96%   BMI 25.40 kg/m   Opioid Risk Score:   Fall Risk Score:  `1  Depression screen [facility] 2/9     01/13/2022   11:01 AM 12/17/2021   10:42 AM 11/12/2021    9:38 AM 10/21/2021    9:27 AM 09/23/2021    9:34 AM 08/27/2021    9:24 AM 08/06/2021    9:24 AM  Depression screen PHQ 2/9  Decreased Interest 0 0 0 0 0 0 0  Down, Depressed, Hopeless 0 0 0 0 0 0 0  PHQ - 2 Score 0 0 0 0 0 0 0  Altered sleeping  1       Tired, decreased energy  1       Change in appetite  0       Feeling bad or failure about yourself   0       Trouble concentrating  0       Moving slowly or fidgety/restless  0       Suicidal thoughts  0       PHQ-9 Score  2       Difficult doing work/chores  Not difficult at all          Review of Systems  Constitutional: Negative.   HENT: Negative.    Eyes: Negative.   Respiratory: Negative.    Cardiovascular: Negative.   Gastrointestinal: Negative.   Endocrine: Negative.   Genitourinary: Negative.   Musculoskeletal:  Positive for back pain.  Skin: Negative.   Allergic/Immunologic: Negative.   Neurological: Negative.   Hematological: Negative.   Psychiatric/Behavioral: Negative.        Objective:   Physical Exam Vitals reviewed.  Constitutional:      Appearance: She is normal weight.  HENT:     Head: Normocephalic and atraumatic.  Musculoskeletal:     Comments: Cervical spine range of motion 50% flexion extension lateral rotation lateral bending No tenderness to palpation in the cervical paraspinal area No pain with shoulder range of motion  Neurological:     Mental Status: She is alert and oriented to person, place, and time.     Deep Tendon Reflexes:     Reflex Scores:      Tricep reflexes are 2+ on the right side and  2+ on the left side.      Bicep reflexes are 2+ on the right side and 2+ on the left  side.      Brachioradialis reflexes are 2+ on the right side and 2+ on the left side.      Patellar reflexes are 2+ on the right side and 2+ on the left side.      Achilles reflexes are 2+ on the right side and 2+ on the left side.    Comments: Motor strength is 5/5 bilateral deltoid, bicep, tricep, grip, hip flexor, knee extensor, ankle dorsiflexor and plantar flexor Negative straight leg raising bilaterally Positive foraminal compression test on the left causing left shoulder pain Normal sensation bilateral upper and lower limbs to light touch   Psychiatric:        Mood and Affect: Mood normal.        Behavior: Behavior normal.           Assessment & Plan:   1.  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 Follow-up with neurosurgery If surgery is to occur would recommend continuing hydrocodone baseline dose and using oxycodone for postoperative pain as prescribed by neurosurgery 2.  Lumbar postlaminectomy syndrome at baseline levels of pain continue hydrocodone 10/325 times per day 

## 2022-01-17 LAB — TOXASSURE SELECT,+ANTIDEPR,UR

## 2022-01-19 ENCOUNTER — Telehealth: Payer: Self-pay | Admitting: *Deleted

## 2022-01-19 NOTE — Telephone Encounter (Signed)
Urine drug screen for this encounter is consistent for prescribed medication 

## 2022-01-20 ENCOUNTER — Ambulatory Visit (INDEPENDENT_AMBULATORY_CARE_PROVIDER_SITE_OTHER): Payer: BC Managed Care – PPO | Admitting: Family Medicine

## 2022-01-20 ENCOUNTER — Encounter: Payer: Self-pay | Admitting: Family Medicine

## 2022-01-20 VITALS — BP 120/80 | HR 80 | Ht 63.0 in | Wt 143.0 lb

## 2022-01-20 DIAGNOSIS — R079 Chest pain, unspecified: Secondary | ICD-10-CM | POA: Diagnosis not present

## 2022-01-20 DIAGNOSIS — M5412 Radiculopathy, cervical region: Secondary | ICD-10-CM | POA: Diagnosis not present

## 2022-01-20 DIAGNOSIS — I10 Essential (primary) hypertension: Secondary | ICD-10-CM | POA: Diagnosis not present

## 2022-01-20 MED ORDER — HYDROCHLOROTHIAZIDE 12.5 MG PO TABS: ORAL_TABLET | ORAL | 1 refills | Status: DC

## 2022-01-20 NOTE — Progress Notes (Signed)
Date:  01/20/2022   Name:  Elizabeth Torres   DOB:  Oct 01, 1975   MRN:  [id number]   Chief Complaint: Hypertension  Hypertension This is a chronic problem. The current episode started more than 1 year ago. The problem has been gradually improving since onset. The problem is controlled. Associated symptoms include chest pain and shortness of breath. Pertinent negatives include no headaches, orthopnea, palpitations, PND or sweats. Past treatments include diuretics. The current treatment provides moderate improvement. There are no compliance problems.  There is no history of angina, kidney disease, CAD/MI, CVA, heart failure, left ventricular hypertrophy, PVD or retinopathy. There is no history of chronic renal disease.  Chest Pain  This is a chronic problem. The current episode started more than 1 year ago. The onset quality is undetermined. The problem occurs constantly. The problem has been unchanged. The pain is present in the substernal region. The pain is at a severity of 6/10. The pain is moderate. The quality of the pain is described as pressure ("cold"). The pain radiates to the right neck and left neck. Associated symptoms include shortness of breath. Pertinent negatives include no abdominal pain, cough, diaphoresis, exertional chest pressure, headaches, irregular heartbeat, orthopnea, palpitations or PND. She has tried nothing for the symptoms.  Her past medical history is significant for hyperlipidemia and hypertension.  Pertinent negatives for past medical history include no diabetes and no PVD.  Hyperlipidemia This is a chronic problem. The current episode started more than 1 year ago. The problem is controlled. Recent lipid tests were reviewed and are normal. She has no history of chronic renal disease, diabetes, hypothyroidism, liver disease, obesity or nephrotic syndrome. Associated symptoms include chest pain and shortness of breath. Current antihyperlipidemic treatment includes diet  change. The current treatment provides moderate improvement of lipids. There are no compliance problems.     Lab Results  Component Value Date   NA 139 07/22/2021   K 4.7 07/22/2021   CO2 23 07/22/2021   GLUCOSE 88 07/22/2021   BUN 15 07/22/2021   CREATININE 0.78 07/22/2021   CALCIUM 9.3 07/22/2021   EGFR 96 07/22/2021   GFRNONAA 107 01/26/2020   Lab Results  Component Value Date   CHOL 159 10/22/2020   HDL 58 10/22/2020   LDLCALC 86 10/22/2020   TRIG 77 10/22/2020   Lab Results  Component Value Date   TSH 2.360 07/22/2021   No results found for: "HGBA1C" Lab Results  Component Value Date   WBC 8.1 07/22/2021   HGB 14.0 07/22/2021   HCT 41.0 07/22/2021   MCV 95 07/22/2021   PLT 274 07/22/2021   Lab Results  Component Value Date   ALT 26 01/26/2020   AST 23 01/26/2020   ALKPHOS 57 01/26/2020   BILITOT 0.3 01/26/2020   No results found for: "25OHVITD2", "25OHVITD3", "VD25OH"   Review of Systems  Constitutional:  Negative for diaphoresis and fatigue.  Respiratory:  Positive for shortness of breath. Negative for cough and choking.   Cardiovascular:  Positive for chest pain. Negative for palpitations, orthopnea, leg swelling and PND.  Gastrointestinal:  Negative for abdominal distention and abdominal pain.       No dysphagia  Skin:  Negative for color change.  Neurological:  Negative for headaches.    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 RAd and chemo tx   BREAST SURGERY     mastectomy Right    partial with lymph node dissection   PORT A CATH REVISION     PORT-A-CATH REMOVAL  11-07-15   Dr Bary Castilla   SHOULDER ARTHROSCOPY WITH ROTATOR CUFF REPAIR Left 05/18/2019   Procedure: SHOULDER ARTHROSCOPY WITH SUBACROMIAL DECOMPRESSION AND DISTAL CLAVICAL EXCISION, INTRAARTICULAR DEBRIDEMENT;  Surgeon: Lovell Sheehan, MD;  Location: [facility];  Service: Orthopedics;  Laterality: Left;   SPINE SURGERY  2008   Dr Joya Salm   TUBAL LIGATION      Social History   Tobacco Use   Smoking status: Former    Packs/day: 1.00    Years: 20.00    Total pack years: 20.00    Types: Cigarettes    Quit date: 01/08/2020    Years since quitting: 2.0   Smokeless tobacco: Never  Vaping Use   Vaping Use: Never used  Substance Use Topics   Alcohol use: Not Currently   Drug use: Never     Medication list has been reviewed and updated.  Current Meds  Medication Sig   hydrochlorothiazide (HYDRODIURIL) 12.5 MG tablet TAKE 1 TABLET(12.5 MG) BY MOUTH DAILY   HYDROcodone-acetaminophen (NORCO) 10-325 MG tablet Take 1 tablet by mouth 5 (five) times daily as needed.   ibuprofen (ADVIL) 600 MG tablet Take 600 mg by mouth every 6 (six) hours as needed.   methocarbamol (ROBAXIN) 500 MG tablet TAKE 1 TABLET(500 MG) BY MOUTH TWICE DAILY AS NEEDED FOR MUSCLE  SPASMS   minoxidil (LONITEN) 2.5 MG tablet Take 1.25 mg by mouth 2 (two) times daily.   nortriptyline (PAMELOR) 10 MG capsule Take 3 capsules (30 mg total) by mouth at bedtime.   Vitamin D, Ergocalciferol, (DRISDOL) 1.25 MG (50000 UNIT) CAPS capsule Take 50,000 Units by mouth once a week.   zolpidem (AMBIEN) 10 MG tablet TAKE 1 TABLET BY MOUTH AT BEDTIME AS NEEDED FOR INSOMNIA   [DISCONTINUED] ipratropium (ATROVENT) 0.06 % nasal spray Place 2 sprays into both nostrils 4 (four) times daily.       01/20/2022   10:06 AM 12/17/2021   10:43 AM 07/22/2021    8:00 AM 04/21/2021   11:30 AM  GAD 7 : Generalized Anxiety Score  Nervous, Anxious, on Edge 0 0 0 0  Control/stop worrying 0 0 0 0  Worry too much - different things 2 1 0 0  Trouble relaxing 0 0 0 1  Restless 0 0 0 0  Easily annoyed or irritable 1 0 0 0  Afraid -  awful might happen 1 1 0 0  Total GAD 7 Score 4 2 0 1  Anxiety Difficulty Not difficult at all Not difficult at all  Not difficult at all       01/20/2022   10:06 AM  Depression screen PHQ 2/9  Decreased Interest 0  Down, Depressed, Hopeless 0  PHQ - 2 Score 0  Altered sleeping 2  Tired, decreased energy 1  Change in appetite 0  Feeling bad or failure about yourself  0  Trouble concentrating 0  Moving slowly or fidgety/restless 0  Suicidal thoughts 0  PHQ-9 Score 3  Difficult doing work/chores Not difficult at all    BP Readings from Last 3 Encounters:  01/20/22 120/80  01/13/22 111/79  01/12/22 113/84    Physical Exam Vitals and nursing note reviewed.  HENT:     Right Ear: Tympanic membrane and ear canal normal.     Left Ear: Tympanic membrane and ear canal normal.     Nose: Nose normal. No congestion.  Neck:     Vascular: No carotid bruit.  Cardiovascular:     Rate and Rhythm: Normal rate.     Heart sounds: No murmur heard.    No friction rub. No gallop.  Pulmonary:     Breath sounds: No wheezing, rhonchi or rales.  Chest:     Chest wall: No  tenderness.  Abdominal:     General: There is no distension.     Palpations: There is no hepatomegaly, splenomegaly or mass.     Tenderness: There is no abdominal tenderness. There is no guarding.  Musculoskeletal:     Cervical back: No rigidity or tenderness.  Lymphadenopathy:     Cervical: No cervical adenopathy.  Neurological:     Mental Status: She is alert.     Wt Readings from Last 3 Encounters:  01/20/22 143 lb (64.9 kg)  01/13/22 143 lb 6.4 oz (65 kg)  12/17/21 143 lb (64.9 kg)    BP 120/80   Pulse 80   Ht '5\' 3"'  (1.6 m)   Wt 143 lb (64.9 kg)   BMI 25.33 kg/m   Assessment and Plan:  1. Essential hypertension Chronic.  Controlled.  Stable.  Blood pressure today is 120/80.  Continue hydrochlorothiazide 12.5 mg once a day.  Blood pressure today is 120/80.  Continue hydrochlorothiazide 12.5 mg once a day.  We will check renal function panel. - hydrochlorothiazide (HYDRODIURIL) 12.5 MG tablet; TAKE 1 TABLET(12.5 MG) BY MOUTH DAILY  Dispense: 90 tablet; Refill: 1 - Renal Function Panel  2. Cervical radicular pain Patient with condition of cervical radicular pain which is followed by pain management and physiatry.  This may be the cause of the anterior cervical type pain but we are going to rule out any cardiac or pulmonary concerns at this time is that this is a secondary presentation  3. Chest pain, unspecified type Chronic pain has been going on for over a year almost constant.  It is in the anterior aspect described as tightness/cold.  This is atypical in nature but we will proceed with EKG which is read as followed: Sinus rhythm.  Rate 79.  Intervals normal.  Axis normal at 50 degrees.  Can there is no criteria that meets LVH.  There is no Q waves, ST-T wave changes, or delay in R wave progression.  And compared to EKG from 2020 is unchanged.  We will continue to observe this but proceed with a chest x-ray to see if there  is any problem with the pulmonary concern or  enlargement of the hilar area. - Lipid Panel With LDL/HDL Ratio - DG Chest 2 View; Future - EKG 12-Lead

## 2022-01-21 ENCOUNTER — Ambulatory Visit: Payer: BC Managed Care – PPO

## 2022-01-21 LAB — RENAL FUNCTION PANEL
Albumin: 4.7 g/dL (ref 3.8–4.8)
BUN/Creatinine Ratio: 21 (ref 9–23)
BUN: 15 mg/dL (ref 6–24)
CO2: 25 mmol/L (ref 20–29)
Calcium: 9.9 mg/dL (ref 8.7–10.2)
Chloride: 98 mmol/L (ref 96–106)
Creatinine, Ser: 0.71 mg/dL (ref 0.57–1.00)
Glucose: 84 mg/dL (ref 70–99)
Phosphorus: 3.4 mg/dL (ref 3.0–4.3)
Potassium: 4.2 mmol/L (ref 3.5–5.2)
Sodium: 138 mmol/L (ref 134–144)
eGFR: 107 mL/min/{1.73_m2} (ref >59)

## 2022-01-21 LAB — LIPID PANEL WITH LDL/HDL RATIO
Cholesterol, Total: 181 mg/dL (ref 100–199)
HDL: 62 mg/dL (ref >39)
LDL Chol Calc (NIH): 100 mg/dL — ABNORMAL HIGH (ref 0–99)
LDL/HDL Ratio: 1.6 ratio (ref 0.0–3.2)
Triglycerides: 107 mg/dL (ref 0–149)
VLDL Cholesterol Cal: 19 mg/dL (ref 5–40)

## 2022-01-23 NOTE — Progress Notes (Deleted)
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   INTERVAL HISTORY: Patient returns to clinic today for routine yearly evaluation.  She has noticed a lump on her left upper extremity that is nontender.  She otherwise feels well and is asymptomatic. She has no neurologic complaints.  She denies any recent fevers or illnesses.  She has a good appetite and denies weight loss.  She denies any chest pain, shortness of breath, cough, or hemoptysis.  She has no nausea, vomiting, constipation, or diarrhea.  She has no urinary complaints.  Patient offers no further specific complaints today.  REVIEW OF SYSTEMS:   Review of Systems  Constitutional: Negative.  Negative for fever, malaise/fatigue and weight loss.  Respiratory: Negative.  Negative for cough and shortness of breath.   Cardiovascular: Negative.  Negative for chest pain and leg swelling.  Gastrointestinal: Negative.  Negative for abdominal pain.  Genitourinary: Negative.  Negative for dysuria.  Musculoskeletal: Negative.  Negative for back pain.  Skin: Negative.  Negative for rash.  Neurological: Negative.  Negative for dizziness, sensory change, focal weakness, weakness and headaches.  Psychiatric/Behavioral: Negative.  The patient is not nervous/anxious.     As per HPI. Otherwise, a complete review of systems is negative.  PAST MEDICAL HISTORY: Past Medical History:  Diagnosis Date   BRCA negative    Breast cancer ([place]) 2013   RT LUMPECTOMY with chemo and rad tx   Degenerative  disc disease, lumbar    Personal history of chemotherapy 2013   for breast ca   Personal history of radiation therapy 2013   F/U right breast cancer    Radiation 2013   BREAST CA   Status post chemotherapy 2013   BREAST CA    PAST SURGICAL HISTORY: Past Surgical History:  Procedure Laterality Date   BREAST BIOPSY Right 07/2011   invasive ductal carcinoma   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 RAd and chemo tx   BREAST SURGERY     mastectomy Right    partial with lymph node dissection   PORT A CATH REVISION     PORT-A-CATH REMOVAL  11-07-15   Dr Bary Castilla   SHOULDER ARTHROSCOPY WITH ROTATOR CUFF REPAIR Left 05/18/2019   Procedure: SHOULDER ARTHROSCOPY WITH SUBACROMIAL DECOMPRESSION AND DISTAL CLAVICAL EXCISION, INTRAARTICULAR DEBRIDEMENT;  Surgeon: Lovell Sheehan, MD;  Location: [facility];  Service: Orthopedics;  Laterality: Left;   SPINE SURGERY  2008   Dr Joya Salm   TUBAL LIGATION      FAMILY HISTORY Family History  Problem Relation Age of Onset   Diabetes Mother    Hypertension Mother    Pulmonary fibrosis Mother    Cancer Paternal Uncle    Breast cancer Paternal Uncle        65'S   Breast cancer Paternal Aunt        38'S   Other Father        unknown medical history       ADVANCED DIRECTIVES:    HEALTH MAINTENANCE: Social History   Tobacco Use   Smoking status:  Former    Packs/day: 1.00    Years: 20.00    Total pack years: 20.00    Types: Cigarettes    Quit date: 01/08/2020    Years since quitting: 2.0   Smokeless tobacco: Never  Vaping Use   Vaping Use: Never used  Substance Use Topics   Alcohol use: Not Currently   Drug use: Never     Colonoscopy:  PAP:  Bone density:  Lipid panel:  Allergies  Allergen Reactions   Penicillins Anaphylaxis   Pregabalin Other (See Comments)    Thoughts of self harm   Sulfa Antibiotics Anaphylaxis   Ondansetron     migraines   Other    Zofran  [Ondansetron Hcl]     migraines    Current Outpatient Medications  Medication Sig Dispense Refill   hydrochlorothiazide (HYDRODIURIL) 12.5 MG tablet TAKE 1 TABLET(12.5 MG) BY MOUTH DAILY 90 tablet 1   HYDROcodone-acetaminophen (NORCO) 10-325 MG tablet Take 1 tablet by mouth 5 (five) times daily as needed. 150 tablet 0   ibuprofen (ADVIL) 600 MG tablet Take 600 mg by mouth every 6 (six) hours as needed.     methocarbamol (ROBAXIN) 500 MG tablet TAKE 1 TABLET(500 MG) BY MOUTH TWICE DAILY AS NEEDED FOR MUSCLE SPASMS 60 tablet 2   minoxidil (LONITEN) 2.5 MG tablet Take 1.25 mg by mouth 2 (two) times daily.     nortriptyline (PAMELOR) 10 MG capsule Take 3 capsules (30 mg total) by mouth at bedtime. 270 capsule 1   Vitamin D, Ergocalciferol, (DRISDOL) 1.25 MG (50000 UNIT) CAPS capsule Take 50,000 Units by mouth once a week.     zolpidem (AMBIEN) 10 MG tablet TAKE 1 TABLET BY MOUTH AT BEDTIME AS NEEDED FOR INSOMNIA 30 tablet 3   No current facility-administered medications for this visit.    OBJECTIVE: There were no vitals filed for this visit.    There is no height or weight on file to calculate BMI.    ECOG FS:0 - Asymptomatic  General: Well-developed, well-nourished, no acute distress. Eyes: Pink conjunctiva, anicteric sclera. HEENT: Normocephalic, moist mucous membranes. Breast: Bilateral breast and axilla without lumps or masses. Lungs: No audible wheezing or coughing. Heart: Regular rate and rhythm. Abdomen: Soft, nontender, no obvious distention. Musculoskeletal: No edema, cyanosis, or clubbing.  1 to 2 cm nontender nodule palpated in left upper extremity consistent with lipoma. Neuro: Alert, answering all questions appropriately. Cranial nerves grossly intact. Skin: No rashes or petechiae noted. Psych: Normal affect.    LAB RESULTS:  Lab Results  Component Value Date   NA 138 01/20/2022   K 4.2 01/20/2022   CL 98 01/20/2022   CO2 25 01/20/2022   GLUCOSE 84 01/20/2022    BUN 15 01/20/2022   CREATININE 0.71 01/20/2022   CALCIUM 9.9 01/20/2022   PROT 6.6 01/26/2020   ALBUMIN 4.7 01/20/2022   AST 23 01/26/2020   ALT 26 01/26/2020   ALKPHOS 57 01/26/2020   BILITOT 0.3 01/26/2020   GFRNONAA 107 01/26/2020   GFRAA 124 01/26/2020    Lab Results  Component Value Date   WBC 8.1 07/22/2021   NEUTROABS 4.2 07/22/2021   HGB 14.0 07/22/2021   HCT 41.0 07/22/2021   MCV 95 07/22/2021   PLT 274 07/22/2021     STUDIES: No results found.  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   PLAN:    1. Triple negative stage Ia invasive carcinoma of the lower outer  quadrant of the right breast: BRCA negative.  Patient completed chemotherapy with AC-Taxol in July 2013. She completed adjuvant XRT in October 2013.  Her most recent mammogram on April 19, 2019 was reported as BI-RADS 2.  Repeat in September 2021.  Her most recent CA 27-29 in March 2019 was within normal limits at 36.9.  She does not require breast MRI at this time. She does not require tamoxifen given the ER/PR status of her tumor.  Return to clinic in 1 year for routine evaluation. 2.  Left arm nodule: Likely a benign lipoma. Will get ultrasound to confirm.   Patient expressed understanding and was in agreement with this plan. She also understands that She can call clinic at any time with any questions, concerns, or complaints.   Lloyd Huger, MD   01/23/2022 12:57 PM

## 2022-01-27 ENCOUNTER — Ambulatory Visit: Payer: BC Managed Care – PPO | Admitting: Oncology

## 2022-01-27 ENCOUNTER — Other Ambulatory Visit: Payer: BC Managed Care – PPO

## 2022-01-27 ENCOUNTER — Encounter: Payer: Self-pay | Admitting: Family Medicine

## 2022-01-27 ENCOUNTER — Ambulatory Visit
Admission: RE | Admit: 2022-01-27 | Discharge: 2022-01-27 | Disposition: A | Discharge status: Home / Self Care | Payer: BC Managed Care – PPO | Source: Ambulatory Visit | Attending: Family Medicine | Admitting: Family Medicine

## 2022-01-27 ENCOUNTER — Ambulatory Visit
Admission: RE | Admit: 2022-01-27 | Discharge: 2022-01-27 | Disposition: A | Discharge status: Home / Self Care | Payer: BC Managed Care – PPO | Attending: Family Medicine | Admitting: Family Medicine

## 2022-01-27 DIAGNOSIS — R079 Chest pain, unspecified: Secondary | ICD-10-CM | POA: Insufficient documentation

## 2022-01-27 DIAGNOSIS — C50511 Malignant neoplasm of lower-outer quadrant of right female breast: Secondary | ICD-10-CM

## 2022-02-10 ENCOUNTER — Encounter: Payer: Self-pay | Admitting: Registered Nurse

## 2022-02-10 ENCOUNTER — Encounter: Payer: BC Managed Care – PPO | Attending: Physical Medicine and Rehabilitation | Admitting: Registered Nurse

## 2022-02-10 VITALS — BP 126/84 | HR 9 | Ht 63.0 in | Wt 147.0 lb

## 2022-02-10 DIAGNOSIS — T451X5A Adverse effect of antineoplastic and immunosuppressive drugs, initial encounter: Secondary | ICD-10-CM

## 2022-02-10 DIAGNOSIS — G620 Drug-induced polyneuropathy: Secondary | ICD-10-CM

## 2022-02-10 DIAGNOSIS — G4700 Insomnia, unspecified: Secondary | ICD-10-CM

## 2022-02-10 DIAGNOSIS — Z79891 Long term (current) use of opiate analgesic: Secondary | ICD-10-CM | POA: Diagnosis not present

## 2022-02-10 DIAGNOSIS — M5416 Radiculopathy, lumbar region: Secondary | ICD-10-CM | POA: Diagnosis not present

## 2022-02-10 DIAGNOSIS — Z5181 Encounter for therapeutic drug level monitoring: Secondary | ICD-10-CM

## 2022-02-10 DIAGNOSIS — G894 Chronic pain syndrome: Secondary | ICD-10-CM | POA: Diagnosis not present

## 2022-02-10 DIAGNOSIS — M961 Postlaminectomy syndrome, not elsewhere classified: Secondary | ICD-10-CM

## 2022-02-10 DIAGNOSIS — M542 Cervicalgia: Secondary | ICD-10-CM

## 2022-02-10 MED ORDER — HYDROCODONE-ACETAMINOPHEN 10-325 MG PO TABS: 1.0000 | ORAL_TABLET | Freq: Every day | ORAL | 0 refills | Status: DC | PRN

## 2022-02-10 NOTE — Progress Notes (Signed)
Subjective:    Patient ID: Elizabeth Torres, female    DOB: 1976-06-15, 46 y.o.   MRN: [id number]  HPI: Elizabeth Torres is a 46 y.o. female who returns for follow up appointment for chronic pain and medication refill. She states her pain is located in her neck and lower back pain radiating into her bilateral lower extremities. She rates her pain 4. Her current exercise regime is walking and performing stretching exercises.  Elizabeth Torres Morphine equivalent is 50.00 MME.   Last UDS was Performed on 01/13/2022, it was consistent.    Vitals: 126/84  P: 95 o2 Sat 94% Pain Inventory Average Pain 4 Pain Right Now 4 My pain is constant and aching  In the last 24 hours, has pain interfered with the following? General activity 3 Relation with others 3 Enjoyment of life 3 What TIME of day is your pain at its worst? evening Sleep (in general) Poor  Pain is worse with: sitting, inactivity, and some activites Pain improves with: rest and medication Relief from Meds: 9  Family History  Problem Relation Age of Onset   Diabetes Mother    Hypertension Mother    Pulmonary fibrosis Mother    Cancer Paternal Uncle    Breast cancer Paternal Uncle        33'S   Breast cancer Paternal Aunt        90'S   Other Father        unknown medical history   Social History   Socioeconomic History   Marital status: Married    Spouse name: Elizabeth Torres   Number of children: 3   Years of education: Not on file   Highest education level: Not on file  Occupational History   Not on file  Tobacco Use   Smoking status: Former    Packs/day: 1.00    Years: 20.00    Total pack years: 20.00    Types: Cigarettes    Quit date: 01/08/2020    Years since quitting: 2.0   Smokeless tobacco: Never  Vaping Use   Vaping Use: Never used  Substance and Sexual Activity   Alcohol use: Not Currently   Drug use: Never   Sexual activity: Yes    Partners: Male  Other Topics Concern   Not on file  Social History Narrative    Not on file   Social Determinants of Health   Financial Resource Strain: Not on file  Food Insecurity: Not on file  Transportation Needs: Not on file  Physical Activity: Not on file  Stress: Not on file  Social Connections: Not on file   Past Surgical History:  Procedure Laterality Date   BREAST BIOPSY Right 07/2011   invasive ductal carcinoma   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 RAd and chemo tx   BREAST SURGERY     mastectomy Right    partial with lymph node dissection   PORT A CATH REVISION     PORT-A-CATH REMOVAL  11-07-15   Dr Bary Castilla   SHOULDER ARTHROSCOPY WITH ROTATOR CUFF REPAIR Left 05/18/2019   Procedure: SHOULDER ARTHROSCOPY WITH SUBACROMIAL DECOMPRESSION AND DISTAL CLAVICAL EXCISION, INTRAARTICULAR DEBRIDEMENT;  Surgeon: Lovell Sheehan, MD;  Location: [facility];  Service: Orthopedics;  Laterality: Left;   SPINE SURGERY  2008   Dr Joya Salm   TUBAL LIGATION     Past Surgical History:  Procedure Laterality Date   BREAST BIOPSY Right 07/2011   invasive ductal  carcinoma   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 RAd and chemo tx   BREAST SURGERY     mastectomy Right    partial with lymph node dissection   PORT A CATH REVISION     PORT-A-CATH REMOVAL  11-07-15   Dr Byrnett   SHOULDER ARTHROSCOPY WITH ROTATOR CUFF REPAIR Left 05/18/2019   Procedure: SHOULDER ARTHROSCOPY WITH SUBACROMIAL DECOMPRESSION AND DISTAL CLAVICAL EXCISION, INTRAARTICULAR DEBRIDEMENT;  Surgeon: Bowers, James R, MD;  Location: [facility];  Service: Orthopedics;  Laterality: Left;   SPINE SURGERY  2008   Dr Botero   TUBAL LIGATION     Past Medical History:  Diagnosis Date   BRCA negative    Breast cancer ([facility]) 2013   RT LUMPECTOMY with chemo and rad tx   Degenerative disc disease, lumbar    Personal history of chemotherapy 2013   for breast ca   Personal history of radiation  therapy 2013   F/U right breast cancer    Radiation 2013   BREAST CA   Status post chemotherapy 2013   BREAST CA   BP 126/84   Pulse (!) 9   Ht 5' 3" (1.6 m)   Wt 147 lb (66.7 kg)   SpO2 94%   BMI 26.04 kg/m   Opioid Risk Score:   Fall Risk Score:  `1  Depression screen PHQ 2/9     01/20/2022   10:06 AM 01/13/2022   11:01 AM 12/17/2021   10:42 AM 11/12/2021    9:38 AM 10/21/2021    9:27 AM 09/23/2021    9:34 AM 08/27/2021    9:24 AM  Depression screen PHQ 2/9  Decreased Interest 0 0 0 0 0 0 0  Down, Depressed, Hopeless 0 0 0 0 0 0 0  PHQ - 2 Score 0 0 0 0 0 0 0  Altered sleeping 2  1      Tired, decreased energy 1  1      Change in appetite 0  0      Feeling bad or failure about yourself  0  0      Trouble concentrating 0  0      Moving slowly or fidgety/restless 0  0      Suicidal thoughts 0  0      PHQ-9 Score 3  2      Difficult doing work/chores Not difficult at all  Not difficult at all         Review of Systems  Musculoskeletal:  Positive for back pain.       Bilateral leg pain  All other systems reviewed and are negative.     Objective:   Physical Exam Vitals and nursing note reviewed.  Constitutional:      Appearance: Normal appearance.  Neck:     Comments: Cervical Paraspinal Tenderness: C-5-C-6 Mainly Left Side  Cardiovascular:     Rate and Rhythm: Normal rate and regular rhythm.  Pulmonary:     Effort: Pulmonary effort is normal.     Breath sounds: Normal breath sounds.  Musculoskeletal:     Cervical back: Normal range of motion and neck supple.     Comments: Normal Muscle Bulk and Muscle Testing Reveals:  Upper Extremities: Right: Full ROM and Muscle Strength 5/5 Left  Upper Extremity: Decreased ROM 90 Degrees and Muscle Strength 5/5 Left AC Joint Tenderness Lower Extremities: Full ROM and Muscle Strength 5/5 Arises from chir with ease Narrow   Based  Gait     Skin:    General: Skin is warm and dry.  Neurological:     Mental Status: She is  alert and oriented to person, place, and time.  Psychiatric:        Mood and Affect: Mood normal.        Behavior: Behavior normal.         Assessment & Plan:  1. Lumbar postlaminectomy syndrome status post L5-S1 fusion with chronic S1 radiculopathy. Continue current medication regimen with Nortriptyline. 02/10/2022. Refilled :  Hydrocodone 10/325 mg #150-one tablet 5 times a day as needed for pain.We will continue the opioid monitoring program, this consists of regular clinic visits, examinations, urine drug screen, pill counts as well as use of [place] Controlled Substance Reporting system. A 12 month History has been reviewed on the [place] Controlled Substance Reporting System on 02/10/2022. 2. 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 02/10/2022 3. Insomnia: Continue current medication regimen Ambien. 02/10/2022  4.Chronic Left Shoulder Pain: No complaints today: S/P Left Shoulder Arthroscopy with Rotator Cuff Repair on 05/18/2019 by Dr. Bowersr : Ortho Following. 02/10/2022 5. Cervicalgia/ Cervical Radiculitis: Continue Pamelor Dr Lateef following. We will continue to monitor.  02/10/2022   F/U in 1 month.  

## 2022-02-18 ENCOUNTER — Other Ambulatory Visit: Payer: Self-pay | Admitting: Registered Nurse

## 2022-02-20 ENCOUNTER — Other Ambulatory Visit: Payer: Self-pay | Admitting: *Deleted

## 2022-02-20 DIAGNOSIS — C50511 Malignant neoplasm of lower-outer quadrant of right female breast: Secondary | ICD-10-CM

## 2022-02-23 NOTE — Progress Notes (Deleted)
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   INTERVAL HISTORY: Patient returns to clinic today for routine yearly evaluation.  She has noticed a lump on her left upper extremity that is nontender.  She otherwise feels well and is asymptomatic. She has no neurologic complaints.  She denies any recent fevers or illnesses.  She has a good appetite and denies weight loss.  She denies any chest pain, shortness of breath, cough, or hemoptysis.  She has no nausea, vomiting, constipation, or diarrhea.  She has no urinary complaints.  Patient offers no further specific complaints today.  REVIEW OF SYSTEMS:   Review of Systems  Constitutional: Negative.  Negative for fever, malaise/fatigue and weight loss.  Respiratory: Negative.  Negative for cough and shortness of breath.   Cardiovascular: Negative.  Negative for chest pain and leg swelling.  Gastrointestinal: Negative.  Negative for abdominal pain.  Genitourinary: Negative.  Negative for dysuria.  Musculoskeletal: Negative.  Negative for back pain.  Skin: Negative.  Negative for rash.  Neurological: Negative.  Negative for dizziness, sensory change, focal weakness, weakness and headaches.  Psychiatric/Behavioral: Negative.  The patient is not nervous/anxious.     As per HPI. Otherwise, a complete review of systems is negative.  PAST MEDICAL HISTORY: Past Medical History:  Diagnosis Date   BRCA negative    Breast cancer ([facility]) 2013   RT LUMPECTOMY with chemo and rad tx   Degenerative  disc disease, lumbar    Personal history of chemotherapy 2013   for breast ca   Personal history of radiation therapy 2013   F/U right breast cancer    Radiation 2013   BREAST CA   Status post chemotherapy 2013   BREAST CA    PAST SURGICAL HISTORY: Past Surgical History:  Procedure Laterality Date   BREAST BIOPSY Right 07/2011   invasive ductal carcinoma   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 RAd and chemo tx   BREAST SURGERY     mastectomy Right    partial with lymph node dissection   PORT A CATH REVISION     PORT-A-CATH REMOVAL  11-07-15   Dr Bary Castilla   SHOULDER ARTHROSCOPY WITH ROTATOR CUFF REPAIR Left 05/18/2019   Procedure: SHOULDER ARTHROSCOPY WITH SUBACROMIAL DECOMPRESSION AND DISTAL CLAVICAL EXCISION, INTRAARTICULAR DEBRIDEMENT;  Surgeon: Lovell Sheehan, MD;  Location: [facility];  Service: Orthopedics;  Laterality: Left;   SPINE SURGERY  2008   Dr Joya Salm   TUBAL LIGATION      FAMILY HISTORY Family History  Problem Relation Age of Onset   Diabetes Mother    Hypertension Mother    Pulmonary fibrosis Mother    Cancer Paternal Uncle    Breast cancer Paternal Uncle        58'S   Breast cancer Paternal Aunt        66'S   Other Father        unknown medical history       ADVANCED DIRECTIVES:    HEALTH MAINTENANCE: Social History   Tobacco Use   Smoking status:  Former    Packs/day: 1.00    Years: 20.00    Total pack years: 20.00    Types: Cigarettes    Quit date: 01/08/2020    Years since quitting: 2.1   Smokeless tobacco: Never  Vaping Use   Vaping Use: Never used  Substance Use Topics   Alcohol use: Not Currently   Drug use: Never     Colonoscopy:  PAP:  Bone density:  Lipid panel:  Allergies  Allergen Reactions   Penicillins Anaphylaxis   Pregabalin Other (See Comments)    Thoughts of self harm   Sulfa Antibiotics Anaphylaxis   Ondansetron     migraines   Other    Zofran  [Ondansetron Hcl]     migraines    Current Outpatient Medications  Medication Sig Dispense Refill   hydrochlorothiazide (HYDRODIURIL) 12.5 MG tablet TAKE 1 TABLET(12.5 MG) BY MOUTH DAILY 90 tablet 1   HYDROcodone-acetaminophen (NORCO) 10-325 MG tablet Take 1 tablet by mouth 5 (five) times daily as needed. 150 tablet 0   ibuprofen (ADVIL) 600 MG tablet Take 600 mg by mouth every 6 (six) hours as needed.     methocarbamol (ROBAXIN) 500 MG tablet TAKE 1 TABLET(500 MG) BY MOUTH TWICE DAILY AS NEEDED FOR MUSCLE SPASMS 60 tablet 2   minoxidil (LONITEN) 2.5 MG tablet Take 1.25 mg by mouth 2 (two) times daily.     nortriptyline (PAMELOR) 10 MG capsule Take 3 capsules (30 mg total) by mouth at bedtime. 270 capsule 1   Vitamin D, Ergocalciferol, (DRISDOL) 1.25 MG (50000 UNIT) CAPS capsule Take 50,000 Units by mouth once a week.     zolpidem (AMBIEN) 10 MG tablet TAKE 1 TABLET BY MOUTH AT BEDTIME AS NEEDED FOR INSOMNIA 30 tablet 3   No current facility-administered medications for this visit.    OBJECTIVE: There were no vitals filed for this visit.    There is no height or weight on file to calculate BMI.    ECOG FS:0 - Asymptomatic  General: Well-developed, well-nourished, no acute distress. Eyes: Pink conjunctiva, anicteric sclera. HEENT: Normocephalic, moist mucous membranes. Breast: Bilateral breast and axilla without lumps or masses. Lungs: No audible wheezing or coughing. Heart: Regular rate and rhythm. Abdomen: Soft, nontender, no obvious distention. Musculoskeletal: No edema, cyanosis, or clubbing.  1 to 2 cm nontender nodule palpated in left upper extremity consistent with lipoma. Neuro: Alert, answering all questions appropriately. Cranial nerves grossly intact. Skin: No rashes or petechiae noted. Psych: Normal affect.    LAB RESULTS:  Lab Results  Component Value Date   NA 138 01/20/2022   K 4.2 01/20/2022   CL 98 01/20/2022   CO2 25 01/20/2022   GLUCOSE 84 01/20/2022    BUN 15 01/20/2022   CREATININE 0.71 01/20/2022   CALCIUM 9.9 01/20/2022   PROT 6.6 01/26/2020   ALBUMIN 4.7 01/20/2022   AST 23 01/26/2020   ALT 26 01/26/2020   ALKPHOS 57 01/26/2020   BILITOT 0.3 01/26/2020   GFRNONAA 107 01/26/2020   GFRAA 124 01/26/2020    Lab Results  Component Value Date   WBC 8.1 07/22/2021   NEUTROABS 4.2 07/22/2021   HGB 14.0 07/22/2021   HCT 41.0 07/22/2021   MCV 95 07/22/2021   PLT 274 07/22/2021     STUDIES: DG Chest 2 View  Result Date: 01/27/2022 CLINICAL DATA:  Chest pain EXAM: CHEST - 2 VIEW COMPARISON:  07/08/2020 FINDINGS: The heart size and mediastinal contours are within normal limits. Both lungs are clear. The  visualized skeletal structures are unremarkable. Remote postop changes of the right breast. Chronic right middle lobe scarring. Stable scoliosis. IMPRESSION: No active cardiopulmonary disease. Electronically Signed   By: Jerilynn Mages.  Shick M.D.   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   PLAN:    1. 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  Patient completed chemotherapy with AC-Taxol in July 2013. She completed adjuvant XRT in October 2013.  Her most recent mammogram on April 19, 2019 was reported as BI-RADS 2.  Repeat in September 2021.  Her most recent CA 27-29 in March 2019 was within normal limits at 36.9.  She does not require breast MRI at this time. She does not require tamoxifen given the ER/PR status of her tumor.  Return to clinic in 1 year for routine evaluation. 2.  Left arm nodule: Likely a benign lipoma. Will get ultrasound to confirm.   Patient expressed understanding and was in agreement with this plan. She also understands that She can call clinic at any time with any questions, concerns, or complaints.   Lloyd Huger, MD   02/23/2022 2:46 PM

## 2022-02-24 ENCOUNTER — Inpatient Hospital Stay: Payer: BC Managed Care – PPO | Attending: Oncology

## 2022-02-24 ENCOUNTER — Inpatient Hospital Stay: Payer: BC Managed Care – PPO | Admitting: Oncology

## 2022-02-24 DIAGNOSIS — C50511 Malignant neoplasm of lower-outer quadrant of right female breast: Secondary | ICD-10-CM

## 2022-03-17 ENCOUNTER — Encounter: Payer: BC Managed Care – PPO | Admitting: Registered Nurse

## 2022-03-17 ENCOUNTER — Ambulatory Visit: Payer: BC Managed Care – PPO | Admitting: Neurosurgery

## 2022-03-17 VITALS — BP 150/93 | HR 83 | Ht 63.0 in | Wt 150.4 lb

## 2022-03-17 DIAGNOSIS — G894 Chronic pain syndrome: Secondary | ICD-10-CM

## 2022-03-17 NOTE — Progress Notes (Unsigned)
Subjective:    Patient ID: Elizabeth Torres, female    DOB: 07-31-1976, 46 y.o.   MRN: [id number]  HPI: Patient not seen   Pain Inventory Average Pain 6 Pain Right Now 6 My pain is constant, stabbing, and aching  In the last 24 hours, has pain interfered with the following? General activity 5 Relation with others 5 Enjoyment of life 5 What TIME of day is your pain at its worst? daytime Sleep (in general) Poor  Pain is worse with: sitting and some activites Pain improves with: rest, heat/ice, and medication Relief from Meds: 5  Family History  Problem Relation Age of Onset   Diabetes Mother    Hypertension Mother    Pulmonary fibrosis Mother    Cancer Paternal Uncle    Breast cancer Paternal Uncle        100'S   Breast cancer Paternal Aunt        60'S   Other Father        unknown medical history   Social History   Socioeconomic History   Marital status: Married    Spouse name: Lyndel Dancel   Number of children: 3   Years of education: Not on file   Highest education level: Not on file  Occupational History   Not on file  Tobacco Use   Smoking status: Former    Packs/day: 1.00    Years: 20.00    Total pack years: 20.00    Types: Cigarettes    Quit date: 01/08/2020    Years since quitting: 2.1   Smokeless tobacco: Never  Vaping Use   Vaping Use: Never used  Substance and Sexual Activity   Alcohol use: Not Currently   Drug use: Never   Sexual activity: Yes    Partners: Male  Other Topics Concern   Not on file  Social History Narrative   Not on file   Social Determinants of Health   Financial Resource Strain: Not on file  Food Insecurity: Not on file  Transportation Needs: Not on file  Physical Activity: Not on file  Stress: Not on file  Social Connections: Not on file   Past Surgical History:  Procedure Laterality Date   BREAST BIOPSY Right 07/2011   invasive ductal carcinoma   BREAST LUMPECTOMY Right 08/2011   invasive ductal carcinoma and DCIS.  Margins clear. RAd and chemo tx   BREAST SURGERY     mastectomy Right    partial with lymph node dissection   PORT A CATH REVISION     PORT-A-CATH REMOVAL  11-07-15   Dr Bary Castilla   SHOULDER ARTHROSCOPY WITH ROTATOR CUFF REPAIR Left 05/18/2019   Procedure: SHOULDER ARTHROSCOPY WITH SUBACROMIAL DECOMPRESSION AND DISTAL CLAVICAL EXCISION, INTRAARTICULAR DEBRIDEMENT;  Surgeon: Lovell Sheehan, MD;  Location: [facility];  Service: Orthopedics;  Laterality: Left;   SPINE SURGERY  2008   Dr Joya Salm   TUBAL LIGATION     Past Surgical History:  Procedure Laterality Date   BREAST BIOPSY Right 07/2011   invasive ductal carcinoma   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 RAd and chemo tx   BREAST SURGERY     mastectomy Right    partial with lymph node dissection   PORT A CATH REVISION     PORT-A-CATH REMOVAL  11-07-15   Dr Bary Castilla   SHOULDER ARTHROSCOPY WITH ROTATOR CUFF REPAIR Left 05/18/2019   Procedure: SHOULDER ARTHROSCOPY WITH SUBACROMIAL DECOMPRESSION AND DISTAL CLAVICAL EXCISION,  INTRAARTICULAR DEBRIDEMENT;  Surgeon: Lovell Sheehan, MD;  Location: [facility];  Service: Orthopedics;  Laterality: Left;   SPINE SURGERY  2008   Dr Joya Salm   TUBAL LIGATION     Past Medical History:  Diagnosis Date   BRCA negative    Breast cancer ([facility] [place]) 2013   RT LUMPECTOMY with chemo and rad tx   Degenerative disc disease, lumbar    Personal history of chemotherapy 2013   for breast ca   Personal history of radiation therapy 2013   F/U right breast cancer    Radiation 2013   BREAST CA   Status post chemotherapy 2013   BREAST CA   BP (!) 150/93   Pulse 83   Ht '5\' 3"'  (1.6 m)   Wt 150 lb 6.4 oz (68.2 kg)   SpO2 94%   BMI 26.64 kg/m   Opioid Risk Score:   Fall Risk Score:  `1  Depression screen [facility] 2/9     03/17/2022   10:02 AM 02/10/2022    9:54 AM 01/20/2022   10:06 AM 01/13/2022   11:01 AM 12/17/2021   10:42 AM 11/12/2021     9:38 AM 10/21/2021    9:27 AM  Depression screen PHQ 2/9  Decreased Interest 0 0 0 0 0 0 0  Down, Depressed, Hopeless 0 0 0 0 0 0 0  PHQ - 2 Score 0 0 0 0 0 0 0  Altered sleeping   2  1    Tired, decreased energy   1  1    Change in appetite   0  0    Feeling bad or failure about yourself    0  0    Trouble concentrating   0  0    Moving slowly or fidgety/restless   0  0    Suicidal thoughts   0  0    PHQ-9 Score   3  2    Difficult doing work/chores   Not difficult at all  Not difficult at all       Review of Systems  Musculoskeletal:  Positive for back pain and neck pain.       Bilateral leg pain  All other systems reviewed and are negative.     Objective:   Physical Exam        Assessment & Plan:

## 2022-03-19 ENCOUNTER — Telehealth: Payer: Self-pay | Admitting: Registered Nurse

## 2022-03-19 MED ORDER — HYDROCODONE-ACETAMINOPHEN 10-325 MG PO TABS: 1.0000 | ORAL_TABLET | Freq: Every day | ORAL | 0 refills | Status: DC | PRN

## 2022-03-19 NOTE — Telephone Encounter (Signed)
PMP was Reviewed.  Hydrocodone e- scribed today.  Elizabeth Torres had to leave office, she receive a emergent phone call about her daughter , she was taken to [facility].

## 2022-03-23 ENCOUNTER — Telehealth: Payer: Self-pay

## 2022-03-23 NOTE — Telephone Encounter (Signed)
-----   Message from Peggyann Shoals sent at 03/23/2022  1:44 PM EDT ----- Regarding: referral Contact: [phone] Patient missed her appt on 03/17/2022 because her daughter was at [facility]. She rescheduled to 04/14/2022. She would like an other injection with Dr.Lateef, would she need a new referral?

## 2022-03-23 NOTE — Telephone Encounter (Signed)
Doristine Devoid Thanks, Juliann Pulse! Patty, can you let her know that Juliann Pulse will call her?

## 2022-03-26 ENCOUNTER — Encounter: Payer: Self-pay | Admitting: Student in an Organized Health Care Education/Training Program

## 2022-03-26 ENCOUNTER — Ambulatory Visit
Payer: BC Managed Care – PPO | Attending: Student in an Organized Health Care Education/Training Program | Admitting: Student in an Organized Health Care Education/Training Program

## 2022-03-26 ENCOUNTER — Other Ambulatory Visit: Payer: Self-pay

## 2022-03-26 VITALS — BP 132/82 | HR 95 | Temp 97.7°F | Resp 18 | Ht 63.0 in | Wt 150.0 lb

## 2022-03-26 DIAGNOSIS — M4802 Spinal stenosis, cervical region: Secondary | ICD-10-CM | POA: Insufficient documentation

## 2022-03-26 DIAGNOSIS — G894 Chronic pain syndrome: Secondary | ICD-10-CM | POA: Diagnosis not present

## 2022-03-26 DIAGNOSIS — M5412 Radiculopathy, cervical region: Secondary | ICD-10-CM | POA: Diagnosis not present

## 2022-03-26 NOTE — Progress Notes (Signed)
PROVIDER NOTE: Information contained herein reflects review and annotations entered in association with encounter. Interpretation of such information and data should be left to medically-trained personnel. Information provided to patient can be located elsewhere in the medical record under "Patient Instructions". Document created using STT-dictation technology, any transcriptional errors that may result from process are unintentional.    Patient: Elizabeth Torres  Service Category: E/M  Provider: Gillis Santa, MD  DOB: 1975-10-02  DOS: 03/26/2022  Referring Provider: Juline Patch, MD  MRN: [id number]  Specialty: Interventional Pain Management  PCP: Juline Patch, MD  Type: Established Patient  Setting: Ambulatory outpatient    Location: Office  Delivery: Face-to-face     HPI  Elizabeth Torres, a 46 y.o. year old female, is here today because of her Cervical radicular pain [M54.12]. Elizabeth Torres primary complain today is Neck Pain (Bilateral worse on the left) and Shoulder Pain (left) Last encounter: My last encounter with her was on 11/03/2021. Pertinent problems: Elizabeth Torres has Cervical myofascial pain syndrome; Cervical spondylosis with radiculopathy; Cervical radicular pain; and Spinal stenosis in cervical region on their pertinent problem list. Pain Assessment: Severity of Chronic pain is reported as a 7 /10. Location: Neck Right, Left/left shoulder down left arm to just below elbow. Onset: More than a month ago. Quality: Constant, Aching. Timing: Constant. Modifying factor(s): heat, rest, medications. Vitals:  height is 5' 3" (1.6 m) and weight is 150 lb (68 kg). Her temporal temperature is 97.7 F (36.5 C). Her blood pressure is 132/82 and her pulse is 95. Her respiration is 18 and oxygen saturation is 100%.   Reason for encounter:   Elizabeth Torres presents today with neck pain with radiation into her left shoulder and bicep.  She also has occasional radiation down to her elbow at times.  She has a history  of cervical disc herniation at C5-C6 with associated left cervical radiculopathy.  She is status post cervical ESI in February 2023.  She states that she noticed a delayed onset of pain relief with this.  She states that from April to approximately early August she had about 70 to 75% pain relief with her cervical epidural steroid injection but now is experiencing increased pain and difficulty and performing ADLs.  She would like to repeat cervical epidural steroid injection.  She has an upcoming appointment with Dr. Cari Caraway, neurosurgery.  Medication Review  HYDROcodone-acetaminophen, Vitamin D (Ergocalciferol), hydrochlorothiazide, ibuprofen, methocarbamol, minoxidil, nortriptyline, and zolpidem  History Review  Allergy: Elizabeth Torres is allergic to penicillins, pregabalin, sulfa antibiotics, ondansetron, other, and zofran [ondansetron hcl]. Drug: Elizabeth Torres  reports no history of drug use. Alcohol:  reports that she does not currently use alcohol. Tobacco:  reports that she quit smoking about 2 years ago. Her smoking use included cigarettes. She has a 20.00 pack-year smoking history. She has never used smokeless tobacco. Social: Elizabeth Torres  reports that she quit smoking about 2 years ago. Her smoking use included cigarettes. She has a 20.00 pack-year smoking history. She has never used smokeless tobacco. She reports that she does not currently use alcohol. She reports that she does not use drugs. Medical:  has a past medical history of BRCA negative, Breast cancer ([place]) (2013), Degenerative disc disease, lumbar, Personal history of chemotherapy (2013), Personal history of radiation therapy (2013), Radiation (2013), and Status post chemotherapy (2013). Surgical: Elizabeth Torres  has a past surgical history that includes Spine surgery (2008); mastectomy (Right); Breast surgery; Port a cath revision; Port-a-cath removal (11-07-15); Breast  lumpectomy (Right, 08/2011); Breast biopsy (Right, 07/2011); Tubal ligation; and  Shoulder arthroscopy with rotator cuff repair (Left, 05/18/2019). Family: family history includes Breast cancer in her paternal aunt and paternal uncle; Cancer in her paternal uncle; Diabetes in her mother; Hypertension in her mother; Other in her father; Pulmonary fibrosis in her mother.  Laboratory Chemistry Profile   Renal Lab Results  Component Value Date   BUN 15 01/20/2022   CREATININE 0.71 01/20/2022   LABCREA 12.15 (L) 04/17/2015   BCR 21 01/20/2022   GFRAA 124 01/26/2020   GFRNONAA 107 01/26/2020    Hepatic Lab Results  Component Value Date   AST 23 01/26/2020   ALT 26 01/26/2020   ALBUMIN 4.7 01/20/2022   ALKPHOS 57 01/26/2020    Electrolytes Lab Results  Component Value Date   NA 138 01/20/2022   K 4.2 01/20/2022   CL 98 01/20/2022   CALCIUM 9.9 01/20/2022   PHOS 3.4 01/20/2022    Bone No results found for: "VD25OH", "VD125OH2TOT", "VX7939QZ0", "SP2330QT6", "25OHVITD1", "25OHVITD2", "25OHVITD3", "TESTOFREE", "TESTOSTERONE"  Inflammation (CRP: Acute Phase) (ESR: Chronic Phase) Lab Results  Component Value Date   ESRSEDRATE 15 01/06/2010         Note: Above Lab results reviewed.  Recent Imaging Review  DG Chest 2 View CLINICAL DATA:  Chest pain  EXAM: CHEST - 2 VIEW  COMPARISON:  07/08/2020  FINDINGS: The heart size and mediastinal contours are within normal limits. Both lungs are clear. The visualized skeletal structures are unremarkable. Remote postop changes of the right breast. Chronic right middle lobe scarring. Stable scoliosis.  IMPRESSION: No active cardiopulmonary disease.  Electronically Signed   By: Jerilynn Mages.  Shick M.D.   On: 01/27/2022 13:32   Cervical MR wo contrast: Results for orders placed during the hospital encounter of 04/29/21   MR Cervical Spine Wo Contrast   Narrative CLINICAL DATA:  Neck pain for 6 months with bilateral arm pain and hand numbness   EXAM: MRI CERVICAL SPINE WITHOUT CONTRAST   TECHNIQUE: Multiplanar,  multisequence MR imaging of the cervical spine was performed. No intravenous contrast was administered.   COMPARISON:  Cervical myelogram 05/09/2010   FINDINGS: Alignment: Reversal of cervical lordosis   Vertebrae: No fracture, evidence of discitis, or bone lesion.   Cord: Normal signal and morphology.   Posterior Fossa, vertebral arteries, paraspinal tissues: Negative.   Disc levels:   C2-3: Unremarkable.   C3-4: Unremarkable.   C4-5: Disc narrowing and right eccentric bulging with right uncovertebral ridging. Moderate right foraminal impingement   C5-6: Disc narrowing and bulging with broad protrusion. Bilateral uncovertebral spurring asymmetric to the right. Spinal stenosis that causes slight ventral cord indentation. Right more than left foraminal impingement   C6-7: Mild right uncovertebral ridging. Disc narrowing and bulging with shallow right paracentral protrusion contacting the ventral cord. Patent foramina   C7-T1:Unremarkable.   IMPRESSION: 1. Disc degeneration with reversal of cervical lordosis that has progressed from a 2011 myelogram. 2. Disc protrusions contact the ventral cord at C5-6 and C6-7. Diffusely patent spinal canal. 3. Right foraminal impingement at C4-5 and especially C5-6. 4. Moderate left foraminal impingement at C5-6.      Note: Reviewed        Physical Exam  General appearance: Well nourished, well developed, and well hydrated. In no apparent acute distress Mental status: Alert, oriented x 3 (person, place, & time)       Respiratory: No evidence of acute respiratory distress Eyes: PERLA Vitals: BP 132/82 Comment: retaken  Pulse  95   Temp 97.7 F (36.5 C) (Temporal)   Resp 18   Ht 5' 3" (1.6 m)   Wt 150 lb (68 kg)   SpO2 100%   BMI 26.57 kg/m  BMI: Estimated body mass index is 26.57 kg/m as calculated from the following:   Height as of this encounter: 5' 3" (1.6 m).   Weight as of this encounter: 150 lb (68 kg). Ideal:  Ideal body weight: 52.4 kg (115 lb 8.3 oz) Adjusted ideal body weight: 58.7 kg (129 lb 5 oz)  Cervical Spine Area Exam  Skin & Axial Inspection: No masses, redness, edema, swelling, or associated skin lesions Alignment: Symmetrical Functional ROM: Pain restricted ROM      Stability: No instability detected Muscle Tone/Strength: Functionally intact. No obvious neuro-muscular anomalies detected. Sensory (Neurological): Dermatomal pain pattern Palpation: No palpable anomalies               Positive Spurling's bilaterally, L>R   Upper Extremity (UE) Exam      Side: Right upper extremity   Side: Left upper extremity  Skin & Extremity Inspection: Skin color, temperature, and hair growth are WNL. No peripheral edema or cyanosis. No masses, redness, swelling, asymmetry, or associated skin lesions. No contractures.   Skin & Extremity Inspection: Skin color, temperature, and hair growth are WNL. No peripheral edema or cyanosis. No masses, redness, swelling, asymmetry, or associated skin lesions. No contractures.  Functional ROM: Pain restricted ROM for shoulder and elbow   Functional ROM: Pain restricted ROM for shoulder and elbow  Muscle Tone/Strength: Functionally intact. No obvious neuro-muscular anomalies detected.   Muscle Tone/Strength: Functionally intact. No obvious neuro-muscular anomalies detected.  Sensory (Neurological): Dermatomal pain pattern           Sensory (Neurological): Dermatomal pain pattern          Palpation: No palpable anomalies               Palpation: No palpable anomalies              Provocative Test(s):  Phalen's test: deferred Tinel's test: deferred Apley's scratch test (touch opposite shoulder):  Action 1 (Across chest): Decreased ROM Action 2 (Overhead): Decreased ROM Action 3 (LB reach): Decreased ROM     Provocative Test(s):  Phalen's test: deferred Tinel's test: deferred Apley's scratch test (touch opposite shoulder):  Action 1 (Across chest): Decreased  ROM Action 2 (Overhead): Decreased ROM Action 3 (LB reach): Decreased ROM      Assessment   Diagnosis Status  1. Cervical radicular pain   2. Spinal stenosis in cervical region   3. Chronic pain syndrome    Having a Flare-up Persistent Controlled   Updated Problems: Problem  Cervical Radicular Pain  Spinal Stenosis in Cervical Region  Cervical Spondylosis With Radiculopathy  Cervical Myofascial Pain Syndrome   Right trapezius     Plan of Care   1. Cervical radicular pain - Cervical Epidural Injection; Future  2. Spinal stenosis in cervical region - Cervical Epidural Injection; Future  3. Chronic pain syndrome - Cervical Epidural Injection; Future  Repeat cervical epidural steroid injection, left C7-T1 #3.   Orders:  Orders Placed This Encounter  Procedures   Cervical Epidural Injection    Sedation: Patient's choice. Purpose: Diagnostic/Therapeutic Indication(s): Radiculitis and cervicalgia associater with cervical degenerative disc disease.    Standing Status:   Future    Standing Expiration Date:   06/26/2022    Scheduling Instructions:     Procedure: Cervical Epidural Steroid  Injection/Block     Level(s): C7-T1     Laterality: LEFT     Timeframe: As soon as schedule allows    Order Specific Question:   Where will this procedure be performed?    Answer:   [facility]    Comments:   Elizabeth Torres   Follow-up plan:   Return in about 2 weeks (around 04/09/2022) for C-ESI, in clinic [facility].    Recent Visits No visits were found meeting these conditions. Showing recent visits within past 90 days and meeting all other requirements Today's Visits Date Type Provider Dept  03/26/22 Office Visit Gillis Santa, MD [facility]  Showing today's visits and meeting all other requirements Future Appointments No visits were found meeting these conditions. Showing future appointments within next 90 days and meeting all other requirements  I discussed the  assessment and treatment plan with the patient. The patient was provided an opportunity to ask questions and all were answered. The patient agreed with the plan and demonstrated an understanding of the instructions.  Patient advised to call back or seek an in-person evaluation if the symptoms or condition worsens.  Duration of encounter: 44mnutes.  Total time on encounter, as per AMA guidelines included both the face-to-face and non-face-to-face time personally spent by the physician and/or other qualified health care professional(s) on the day of the encounter (includes time in activities that require the physician or other qualified health care professional and does not include time in activities normally performed by clinical staff). Physician's time may include the following activities when performed: preparing to see the patient (eg, review of tests, pre-charting review of records) obtaining and/or reviewing separately obtained history performing a medically appropriate examination and/or evaluation counseling and educating the patient/family/caregiver ordering medications, tests, or procedures referring and communicating with other health care professionals (when not separately reported) documenting clinical information in the electronic or other health record independently interpreting results (not separately reported) and communicating results to the patient/ family/caregiver care coordination (not separately reported)  Note by: BGillis Santa MD Date: 03/26/2022; Time: 9:01 AM

## 2022-03-26 NOTE — Progress Notes (Signed)
Safety precautions to be maintained throughout the outpatient stay will include: orient to surroundings, keep bed in low position, maintain call bell within reach at all times, provide assistance with transfer out of bed and ambulation.  

## 2022-03-31 ENCOUNTER — Other Ambulatory Visit: Payer: Self-pay | Admitting: Neurosurgery

## 2022-03-31 ENCOUNTER — Encounter: Payer: Self-pay | Admitting: Student in an Organized Health Care Education/Training Program

## 2022-03-31 ENCOUNTER — Telehealth: Payer: Self-pay | Admitting: Neurosurgery

## 2022-03-31 NOTE — Telephone Encounter (Signed)
Patient was contacted and she is aware of Dr. Rhea Bleacher instructions. She is already scheduled for a f/u on 04/14/2022 and she has been added to the wait list. She is not seeking any medication. She just wanted to make Dr.Yarbrough aware that she did contact Dr.Lateef's office for an injection and was told the lady that obtains the injection authorizations was on vacation for a week so she had to wait until she got back into the office for the process to start. I did advice her to contact Dr.Lateef's office again. She said that she would.

## 2022-03-31 NOTE — Telephone Encounter (Signed)
-----   Message from Meade Maw, MD sent at 03/31/2022  8:16 AM EDT ----- Regarding: RE: severe pain Contact: [phone] I thin kwe should see her in clinic - I haven't prescribed her medications.  She should contact the prescribing provider for med changes.  Ardyth Gal  ----- Message ----- From: Peggyann Shoals Sent: 03/30/2022   2:07 PM EDT To: Meade Maw, MD Subject: severe pain                                    Patient is trying to get another injection with Dr.Lateef's office but they are having issues getting authorization for the injection. What do you recommend she do? She is in severe pain.  She is currently taking Vicodin 10/'325mg'$  every 4 hours, but it is not touching the pain. She is aware that you are in surgery.

## 2022-04-01 ENCOUNTER — Ambulatory Visit
Payer: BC Managed Care – PPO | Attending: Student in an Organized Health Care Education/Training Program | Admitting: Student in an Organized Health Care Education/Training Program

## 2022-04-01 ENCOUNTER — Encounter: Payer: Self-pay | Admitting: Student in an Organized Health Care Education/Training Program

## 2022-04-01 ENCOUNTER — Ambulatory Visit
Admission: RE | Admit: 2022-04-01 | Discharge: 2022-04-01 | Disposition: A | Discharge status: Home / Self Care | Payer: BC Managed Care – PPO | Source: Ambulatory Visit | Attending: Student in an Organized Health Care Education/Training Program | Admitting: Student in an Organized Health Care Education/Training Program

## 2022-04-01 ENCOUNTER — Other Ambulatory Visit: Payer: Self-pay

## 2022-04-01 VITALS — BP 127/87 | HR 88 | Temp 97.3°F | Resp 16 | Ht 63.0 in | Wt 150.0 lb

## 2022-04-01 DIAGNOSIS — M5412 Radiculopathy, cervical region: Secondary | ICD-10-CM | POA: Insufficient documentation

## 2022-04-01 DIAGNOSIS — G894 Chronic pain syndrome: Secondary | ICD-10-CM | POA: Insufficient documentation

## 2022-04-01 DIAGNOSIS — M4802 Spinal stenosis, cervical region: Secondary | ICD-10-CM | POA: Diagnosis not present

## 2022-04-01 MED ORDER — LIDOCAINE HCL 2 % IJ SOLN
20.0000 mL | Freq: Once | INTRAMUSCULAR | Status: AC
Start: 2022-04-01 — End: 2022-04-01
  Administered 2022-04-01: 400 mg

## 2022-04-01 MED ORDER — DEXAMETHASONE SODIUM PHOSPHATE 10 MG/ML IJ SOLN
INTRAMUSCULAR | Status: AC
  Filled 2022-04-01: qty 1

## 2022-04-01 MED ORDER — ROPIVACAINE HCL 2 MG/ML IJ SOLN
1.0000 mL | Freq: Once | INTRAMUSCULAR | Status: AC
Start: 2022-04-01 — End: 2022-04-01
  Administered 2022-04-01: 1 mL via EPIDURAL

## 2022-04-01 MED ORDER — SODIUM CHLORIDE (PF) 0.9 % IJ SOLN
INTRAMUSCULAR | Status: AC
  Filled 2022-04-01: qty 10

## 2022-04-01 MED ORDER — DEXAMETHASONE SODIUM PHOSPHATE 10 MG/ML IJ SOLN
10.0000 mg | Freq: Once | INTRAMUSCULAR | Status: AC
Start: 2022-04-01 — End: 2022-04-01
  Administered 2022-04-01: 10 mg

## 2022-04-01 MED ORDER — IOHEXOL 180 MG/ML  SOLN
INTRAMUSCULAR | Status: AC
  Filled 2022-04-01: qty 20

## 2022-04-01 MED ORDER — IOHEXOL 180 MG/ML  SOLN
10.0000 mL | Freq: Once | INTRAMUSCULAR | Status: AC
Start: 2022-04-01 — End: 2022-04-01
  Administered 2022-04-01: 10 mL via EPIDURAL

## 2022-04-01 MED ORDER — ROPIVACAINE HCL 2 MG/ML IJ SOLN
INTRAMUSCULAR | Status: AC
  Filled 2022-04-01: qty 20

## 2022-04-01 MED ORDER — SODIUM CHLORIDE 0.9% FLUSH
1.0000 mL | Freq: Once | INTRAVENOUS | Status: AC
Start: 2022-04-01 — End: 2022-04-01
  Administered 2022-04-01: 1 mL

## 2022-04-01 MED ORDER — LIDOCAINE HCL (PF) 2 % IJ SOLN
INTRAMUSCULAR | Status: AC
  Filled 2022-04-01: qty 10

## 2022-04-01 NOTE — Progress Notes (Signed)
Safety precautions to be maintained throughout the outpatient stay will include: orient to surroundings, keep bed in low position, maintain call bell within reach at all times, provide assistance with transfer out of bed and ambulation.  

## 2022-04-01 NOTE — Progress Notes (Signed)
PROVIDER NOTE: Interpretation of information contained herein should be left to medically-trained personnel. Specific patient instructions are provided elsewhere under "Patient Instructions" section of medical record. This document was created in part using STT-dictation technology, any transcriptional errors that may result from this process are unintentional.  Patient: Elizabeth Torres Type: Established DOB: 01/20/1976 MRN: [id number] PCP: Juline Patch, MD  Service: Procedure DOS: 04/01/2022 Setting: Ambulatory Location: Ambulatory outpatient facility Delivery: Face-to-face Provider: Gillis Santa, MD Specialty: Interventional Pain Management Specialty designation: 09 Location: Outpatient facility Ref. Prov.: Juline Patch, MD   Procedure [facility] Interventional Pain Management )   Interlaminar Cervical Epidural Steroid injection (ESI) #3 (#1 done 08/06/21, #2 09/29/2021) Laterality: Left Level: C7-T1 Imaging: Fluoroscopy-assisted DOS: 04/01/2022 Performed by: Gillis Santa, MD Anesthesia: Local anesthesia (1-2% Lidocaine) Anxiolysis: None Sedation: None.   Purpose: Diagnostic/Therapeutic Indications: Cervicalgia, cervical radicular pain, degenerative disc disease, severe enough to impact quality of life or function. 1. Cervical radicular pain   2. Spinal stenosis in cervical region   3. Chronic pain syndrome     NAS-11 score:   Pre-procedure: 9 /10   Post-procedure: 9 /10     Pre-Procedure Preparation  Monitoring: As per clinic protocol. Respiration, ETCO2, SpO2, BP, heart rate and rhythm monitor placed and checked for adequate function  Risk Assessment: Vitals:  GGY:IRSWNIOEV body mass index is 26.57 kg/m as calculated from the following:   Height as of this encounter: '5\' 3"'$  (1.6 m).   Weight as of this encounter: 150 lb (68 kg)., Rate:88ECG Heart Rate: 87, BP:133/89, Resp:16, Temp:(!) 97.3 F (36.3 C), SpO2:99 %  Allergies: She is allergic to penicillins, pregabalin, sulfa  antibiotics, ondansetron, other, and zofran [ondansetron hcl].  Precautions: None required  Blood-thinner(s): None at this time  Coagulopathies: Reviewed. None identified.   Active Infection(s): Reviewed. None identified. Elizabeth Torres is afebrile   Location setting: Procedure suite Position: Prone, on modified reverse trendelenburg to facilitate breathing, with head in head-cradle. Pillows positioned under chest (below chin-level) with cervical spine flexed. Safety Precautions: Patient was assessed for positional comfort and pressure points before starting the procedure. Prepping solution: DuraPrep (Iodine Povacrylex [0.7% available iodine] and Isopropyl Alcohol, 74% w/w) Prep Area: Entire  cervicothoracic region Approach: percutaneous, paramedial Intended target: Posterior cervical epidural space Materials: Tray: Epidural Needle(s): Epidural (Tuohy) Qty: 1 Length: (56m) 3.5-inch Gauge: 22G   Meds ordered this encounter  Medications   iohexol (OMNIPAQUE) 180 MG/ML injection 10 mL    Must be Myelogram-compatible. If not available, you may substitute with a water-soluble, non-ionic, hypoallergenic, myelogram-compatible radiological contrast medium.   lidocaine (XYLOCAINE) 2 % (with pres) injection 400 mg   sodium chloride flush (NS) 0.9 % injection 1 mL   ropivacaine (PF) 2 mg/mL (0.2%) (NAROPIN) injection 1 mL   dexamethasone (DECADRON) injection 10 mg   4 cc solution made of 2 cc of preservative-free saline, 1 cc of 0.2% ropivacaine, 1 cc of Decadron 10 mg/cc.   Orders Placed This Encounter  Procedures   DG PAIN CLINIC C-ARM 1-60 MIN NO REPORT    Intraoperative interpretation by procedural physician at [facility]    Standing Status:   Standing    Number of Occurrences:   1    Order Specific Question:   Reason for exam:    Answer:   Assistance in needle guidance and placement for procedures requiring needle placement in or near specific anatomical locations not easily  accessible without such assistance.     Time-out: 1029 I initiated and conducted  the "Time-out" before starting the procedure, as per protocol. The patient was asked to participate by confirming the accuracy of the "Time Out" information. Verification of the correct person, site, and procedure were performed and confirmed by me, the nursing staff, and the patient. "Time-out" conducted as per Joint Commission's Universal Protocol (UP.01.01.01). Procedure checklist: Completed   H&P (Pre-op  Assessment)  Elizabeth Torres is a 46 y.o. (year old), female patient, seen today for interventional treatment. She  has a past surgical history that includes Spine surgery (2008); mastectomy (Right); Breast surgery; Port a cath revision; Port-a-cath removal (11-07-15); Breast lumpectomy (Right, 08/2011); Breast biopsy (Right, 07/2011); Tubal ligation; and Shoulder arthroscopy with rotator cuff repair (Left, 05/18/2019). Elizabeth Torres has a current medication list which includes the following prescription(s): hydrochlorothiazide, hydrocodone-acetaminophen, ibuprofen, methocarbamol, minoxidil, nortriptyline, vitamin d (ergocalciferol), and zolpidem. Her primarily concern today is the Neck Pain (Neck left is worse )  She is allergic to penicillins, pregabalin, sulfa antibiotics, ondansetron, other, and zofran [ondansetron hcl].   Last encounter: My last encounter with her was on 07/24/2021. Pertinent problems: [name] has Cervical myofascial pain syndrome; Cervical spondylosis with radiculopathy; Cervical radicular pain; and Spinal stenosis in cervical region on their pertinent problem list. Pain Assessment: Severity of Chronic pain is reported as a 9 /10. Location: Neck Left/neck into shoulder blade. Onset: More than a month ago. Quality: Discomfort, Constant, Sharp. Timing: Constant. Modifying factor(s): nothing currently. Vitals:  height is '5\' 3"'$  (1.6 m) and weight is 150 lb (68 kg). Her temporal temperature is 97.3 F (36.3 C)  (abnormal). Her blood pressure is 127/87 and her pulse is 88. Her respiration is 16 and oxygen saturation is 100%.   Reason for encounter: Interventional pain management therapy due pain of at least four (4) weeks in duration, with to failure to respond to and/or inability to tolerate more conservative care.   Related imaging: Cervical MR wo contrast:  Results for orders placed during the hospital encounter of 04/29/21  MR Cervical Spine Wo Contrast  Narrative CLINICAL DATA:  Neck pain for 6 months with bilateral arm pain and hand numbness  EXAM: MRI CERVICAL SPINE WITHOUT CONTRAST  TECHNIQUE: Multiplanar, multisequence MR imaging of the cervical spine was performed. No intravenous contrast was administered.  COMPARISON:  Cervical myelogram 05/09/2010  FINDINGS: Alignment: Reversal of cervical lordosis  Vertebrae: No fracture, evidence of discitis, or bone lesion.  Cord: Normal signal and morphology.  Posterior Fossa, vertebral arteries, paraspinal tissues: Negative.  Disc levels:  C2-3: Unremarkable.  C3-4: Unremarkable.  C4-5: Disc narrowing and right eccentric bulging with right uncovertebral ridging. Moderate right foraminal impingement  C5-6: Disc narrowing and bulging with broad protrusion. Bilateral uncovertebral spurring asymmetric to the right. Spinal stenosis that causes slight ventral cord indentation. Right more than left foraminal impingement  C6-7: Mild right uncovertebral ridging. Disc narrowing and bulging with shallow right paracentral protrusion contacting the ventral cord. Patent foramina  C7-T1:Unremarkable.  IMPRESSION: 1. Disc degeneration with reversal of cervical lordosis that has progressed from a 2011 myelogram. 2. Disc protrusions contact the ventral cord at C5-6 and C6-7. Diffusely patent spinal canal. 3. Right foraminal impingement at C4-5 and especially C5-6. 4. Moderate left foraminal impingement at C5-6.   Electronically  Signed By: Jorje Guild M.D. On: 04/30/2021 07:49   Narrative CLINICAL DATA:  Right-sided neck and arm pain for 9 days. No known injury.  EXAM: CERVICAL SPINE - 2-3 VIEW  COMPARISON:  None.  FINDINGS: There is no evidence of cervical spine fracture  or prevertebral soft tissue swelling. Alignment is normal.  Moderate degenerative disc disease and uncovertebral spurring is seen at C5-6. Other intervertebral disc spaces are maintained. Mild cervical kyphosis also noted. No other bone abnormality identified.  IMPRESSION: No acute findings.  Moderate degenerative disc disease and uncovertebral spurring at C5-6. Mild cervical kyphosis.   Electronically Signed By: Earle Gell M.D. On: 12/18/2015 14:40  Cervical DG F/E views:    Narrative CLINICAL DATA:  LEFT UPPER extremity deformity. Suspect biceps tendon tear. RIGHT neck pain radiates into the RIGHT trapezius. Obvious deformity anterior distal humerus about the elbow, chronic.  EXAM: CERVICAL SPINE - COMPLETE 4+ VIEW  COMPARISON:  12/18/2015  FINDINGS: There is loss of cervical lordosis. This may be secondary to splinting, soft tissue injury, or positioning. There is disc height loss at C5-6. There is 4 millimeters retrolisthesis of C5 on C diff, increased compared to prior study. Significant disc height loss and uncovertebral spurring also noted at C6-7. There is no acute fracture or subluxation. Prevertebral soft tissues are unremarkable. Lung apices are clear.  IMPRESSION: Mid cervical degenerative changes.  Loss of cervical lordosis.   Electronically Signed By: Nolon Nations M.D. On: 02/03/2021 13:28    Site Confirmation: Ms. Johns was asked to confirm the procedure and laterality before marking the site.  Consent: Before the procedure and under the influence of no sedative(s), amnesic(s), or anxiolytics, the patient was informed of the treatment options, risks and possible complications. To  fulfill our ethical and legal obligations, as recommended by the American Medical Association's Code of Ethics, I have informed the patient of my clinical impression; the nature and purpose of the treatment or procedure; the risks, benefits, and possible complications of the intervention; the alternatives, including doing nothing; the risk(s) and benefit(s) of the alternative treatment(s) or procedure(s); and the risk(s) and benefit(s) of doing nothing. The patient was provided information about the general risks and possible complications associated with the procedure. These may include, but are not limited to: failure to achieve desired goals, infection, bleeding, organ or nerve damage, allergic reactions, paralysis, and death. In addition, the patient was informed of those risks and complications associated to Spine-related procedures, such as failure to decrease pain; infection (i.e.: Meningitis, epidural or intraspinal abscess); bleeding (i.e.: epidural hematoma, subarachnoid hemorrhage, or any other type of intraspinal or peri-dural bleeding); organ or nerve damage (i.e.: Any type of peripheral nerve, nerve root, or spinal cord injury) with subsequent damage to sensory, motor, and/or autonomic systems, resulting in permanent pain, numbness, and/or weakness of one or several areas of the body; allergic reactions; (i.e.: anaphylactic reaction); and/or death. Furthermore, the patient was informed of those risks and complications associated with the medications. These include, but are not limited to: allergic reactions (i.e.: anaphylactic or anaphylactoid reaction(s)); adrenal axis suppression; blood sugar elevation that in diabetics may result in ketoacidosis or comma; water retention that in patients with history of congestive heart failure may result in shortness of breath, pulmonary edema, and decompensation with resultant heart failure; weight gain; swelling or edema; medication-induced neural toxicity;  particulate matter embolism and blood vessel occlusion with resultant organ, and/or nervous system infarction; and/or aseptic necrosis of one or more joints. Finally, the patient was informed that Medicine is not an exact science; therefore, there is also the possibility of unforeseen or unpredictable risks and/or possible complications that may result in a catastrophic outcome. The patient indicated having understood very clearly. We have given the patient no guarantees and we have made  no promises. Enough time was given to the patient to ask questions, all of which were answered to the patient's satisfaction. Ms. Gural has indicated that she wanted to continue with the procedure. Attestation: I, the ordering provider, attest that I have discussed with the patient the benefits, risks, side-effects, alternatives, likelihood of achieving goals, and potential problems during recovery for the procedure that I have provided informed consent.  Date  Time: 04/01/2022 10:01 AM   Description of procedure   Start Time: 1030 hrs  Local Anesthesia: Once the patient was positioned, prepped, and time-out was completed. The target area was identified located. The skin was marked with an approved surgical skin marker. Once marked, the skin (epidermis, dermis, and hypodermis), and deeper tissues (fat, connective tissue and muscle) were infiltrated with a small amount of a short-acting local anesthetic, loaded on a 10cc syringe with a 25G, 1.5-in  Needle. An appropriate amount of time was allowed for local anesthetics to take effect before proceeding to the next step. Local Anesthetic: Lidocaine 1-2% The unused portion of the local anesthetic was discarded in the proper designated containers. Safety Precautions: Aspiration looking for blood return was conducted prior to all injections. At no point did I inject any substances, as a needle was being advanced. Before injecting, the patient was told to immediately notify me if  she was experiencing any new onset of "ringing in the ears, or metallic taste in the mouth". No attempts were made at seeking any paresthesias. Safe injection practices and needle disposal techniques used. Medications properly checked for expiration dates. SDV (single dose vial) medications used. After the completion of the procedure, all disposable equipment used was discarded in the proper designated medical waste containers.  Technical description: Protocol guidelines were followed. Using fluoroscopic guidance, the epidural needle was introduced through the skin, ipsilateral to the reported pain, and advanced to the target area. Posterior laminar os was contacted and the needle walked caudad, until the lamina was cleared. The ligamentum flavum was engaged and the epidural space identified using "loss-of-resistance technique" with 2-3 ml of PF-NaCl (0.9% NSS), in a 5cc dedicated LOR syringe. See "Imaging guidance" below for use of contrast details.  Injection: Once satisfactory needle placement was confirmed, I proceeded to inject the desired solution in slow, incremental fashion, intermittently assessing for discomfort or any signs of abnormal or undesired spread of substance. Once completed, the needle was removed and disposed of, as per hospital protocols.   Vitals:   04/01/22 1009 04/01/22 1028 04/01/22 1034  BP: 133/89 127/89 127/87  Pulse: 88    Resp: '16 18 16  '$ Temp: (!) 97.3 F (36.3 C)    TempSrc: Temporal    SpO2: 99% 97% 100%  Weight: 150 lb (68 kg)    Height: '5\' 3"'$  (1.6 m)      End Time: 1034 hrs  Once the entire procedure was completed, the treated area was cleaned, making sure to leave some of the prepping solution back to take advantage of its long term bactericidal properties.   Imaging guidance  Type of Imaging Technique: Fluoroscopy Guidance (Spinal) Indication(s): Assistance in needle guidance and placement for procedures requiring needle placement in or near specific  anatomical locations not easily accessible without such assistance. Exposure Time: Please see nurses notes for exact fluoroscopy time. Contrast: Before injecting any contrast, we confirmed that the patient did not have an allergy to iodine, shellfish, or radiological contrast. Once satisfactory needle placement was completed, radiological contrast was injected under continuous fluoroscopic guidance.  Injection of contrast accomplished without complications. See chart for type and volume of contrast used. Fluoroscopic Guidance: I was personally present in the fluoroscopy suite, where the patient was placed in position for the procedure, over the fluoroscopy-compatible table. Fluoroscopy was manipulated, using "Tunnel Vision Technique", to obtain the best possible view of the target area, on the affected side. Parallax error was corrected before commencing the procedure. A "direction-depth-direction" technique was used to introduce the needle under continuous pulsed fluoroscopic guidance. Once the target was reached, antero-posterior, oblique, and lateral fluoroscopic projection views were taken to confirm needle placement in all planes. Electronic images uploaded into EMR.  Interpretation: Successful epidural injection. Intraoperative imaging interpretation by performing Physician.    Post-op assessment  Post-procedure Vital Signs:  Pulse/HCG Rate: 8890 Temp: (!) 97.3 F (36.3 C) Resp: 16 BP: 127/87 SpO2: 100 %  EBL: None  Complications: No immediate post-treatment complications observed by team, or reported by patient.  Note: The patient tolerated the entire procedure well. A repeat set of vitals were taken after the procedure and the patient was kept under observation following institutional policy, for this type of procedure. Post-procedural neurological assessment was performed, showing return to baseline, prior to discharge. The patient was provided with post-procedure discharge instructions,  including a section on how to identify potential problems. Should any problems arise concerning this procedure, the patient was given instructions to immediately contact us, at any time, without hesitation. In any case, we plan to contact the patient by telephone for a follow-up status report regarding this interventional procedure.  Comments:  No additional relevant information.   5 out of 5 strength bilateral upper extremity: Shoulder abduction, elbow flexion, elbow extension, thumb extension.   Plan of care   Medications administered: We administered iohexol, lidocaine, sodium chloride flush, ropivacaine (PF) 2 mg/mL (0.2%), and dexamethasone.  Follow-up plan:   Return in about 4 weeks (around 04/29/2022) for Post Procedure Evaluation, virtual.     Recent Visits Date Type Provider Dept  03/26/22 Office Visit Gillis Santa, MD [facility]  Showing recent visits within past 90 days and meeting all other requirements Today's Visits Date Type Provider Dept  04/01/22 Procedure visit Gillis Santa, MD [facility]  Showing today's visits and meeting all other requirements Future Appointments Date Type Provider Dept  04/29/22 Appointment Gillis Santa, MD [facility]  Showing future appointments within next 90 days and meeting all other requirements   Disposition: Discharge home  Discharge (Date  Time): 04/01/2022; 1045 hrs.   Primary Care Physician: Juline Patch, MD Location: [facility] Note by: Gillis Santa, MD Date: 04/01/2022; Time: 11:11 AM  DISCLAIMER: Medicine is not an exact science. It has no guarantees or warranties. The decision to proceed with this intervention was based on the information collected from the patient. Conclusions were drawn from the patient's questionnaire, interview, and examination. Because information was provided in large part by the patient, it cannot be guaranteed that it has not been  purposely or unconsciously manipulated or altered. Every effort has been made to obtain as much accurate, relevant, available data as possible. Always take into account that the treatment will also be dependent on availability of resources and existing treatment guidelines, considered by other Pain Management Specialists as being common knowledge and practice, at the time of the intervention. It is also important to point out that variation in procedural techniques and pharmacological choices are the acceptable norm. For Medico-Legal review purposes, the indications, contraindications, technique,  and results of the these procedures should only be evaluated, judged and interpreted by a Board-Certified Interventional Pain Specialist with extensive familiarity and expertise in the same exact procedure and technique.

## 2022-04-01 NOTE — Patient Instructions (Addendum)
____________________________________________________________________________________________  Post-Procedure Discharge Instructions  Instructions: Apply ice:  Purpose: This will minimize any swelling and discomfort after procedure.  When: Day of procedure, as soon as you get home. How: Fill a plastic sandwich bag with crushed ice. Cover it with a small towel and apply to injection site. How long: (15 min on, 15 min off) Apply for 15 minutes then remove x 15 minutes.  Repeat sequence on day of procedure, until you go to bed. Apply heat:  Purpose: To treat any soreness and discomfort from the procedure. When: Starting the next day after the procedure. How: Apply heat to procedure site starting the day following the procedure. How long: May continue to repeat daily, until discomfort goes away. Food intake: Start with clear liquids (like water) and advance to regular food, as tolerated.  Physical activities: Keep activities to a minimum for the first 8 hours after the procedure. After that, then as tolerated. Driving: If you have received any sedation, be responsible and do not drive. You are not allowed to drive for 24 hours after having sedation. Blood thinner: (Applies only to those taking blood thinners) You may restart your blood thinner 6 hours after your procedure. Insulin: (Applies only to Diabetic patients taking insulin) As soon as you can eat, you may resume your normal dosing schedule. Infection prevention: Keep procedure site clean and dry. Shower daily and clean area with soap and water. Post-procedure Pain Diary: Extremely important that this be done correctly and accurately. Recorded information will be used to determine the next step in treatment. For the purpose of accuracy, follow these rules: Evaluate only the area treated. Do not report or include pain from an untreated area. For the purpose of this evaluation, ignore all other areas of pain, except for the treated area. After  your procedure, avoid taking a long nap and attempting to complete the pain diary after you wake up. Instead, set your alarm clock to go off every hour, on the hour, for the initial 8 hours after the procedure. Document the duration of the numbing medicine, and the relief you are getting from it. Do not go to sleep and attempt to complete it later. It will not be accurate. If you received sedation, it is likely that you were given a medication that may cause amnesia. Because of this, completing the diary at a later time may cause the information to be inaccurate. This information is needed to plan your care. Follow-up appointment: Keep your post-procedure follow-up evaluation appointment after the procedure (usually 2 weeks for most procedures, 6 weeks for radiofrequencies). DO NOT FORGET to bring you pain diary with you.   Expect: (What should I expect to see with my procedure?) From numbing medicine (AKA: Local Anesthetics): Numbness or decrease in pain. You may also experience some weakness, which if present, could last for the duration of the local anesthetic. Onset: Full effect within 15 minutes of injected. Duration: It will depend on the type of local anesthetic used. On the average, 1 to 8 hours.  From steroids (Applies only if steroids were used): Decrease in swelling or inflammation. Once inflammation is improved, relief of the pain will follow. Onset of benefits: Depends on the amount of swelling present. The more swelling, the longer it will take for the benefits to be seen. In some cases, up to 10 days. Duration: Steroids will stay in the system x 2 weeks. Duration of benefits will depend on multiple posibilities including persistent irritating factors. Side-effects: If present, they  may typically last 2 weeks (the duration of the steroids). Frequent: Cramps (if they occur, drink Gatorade and take over-the-counter Magnesium 450-500 mg once to twice a day); water retention with temporary  weight gain; increases in blood sugar; decreased immune system response; increased appetite. Occasional: Facial flushing (red, warm cheeks); mood swings; menstrual changes. Uncommon: Long-term decrease or suppression of natural hormones; bone thinning. (These are more common with higher doses or more frequent use. This is why we prefer that our patients avoid having any injection therapies in other practices.)  Very Rare: Severe mood changes; psychosis; aseptic necrosis. From procedure: Some discomfort is to be expected once the numbing medicine wears off. This should be minimal if ice and heat are applied as instructed.  Call if: (When should I call?) You experience numbness and weakness that gets worse with time, as opposed to wearing off. New onset bowel or bladder incontinence. (Applies only to procedures done in the spine)  Emergency Numbers: Durning business hours (Monday - Thursday, 8:00 AM - 4:00 PM) (Friday, 9:00 AM - 12:00 Noon): [phone] After hours: [phone] NOTE: If you are having a problem and are unable connect with, or to talk to a provider, then go to your nearest urgent care or emergency department. If the problem is serious and urgent, please call 911. ____________________________________________________________________________________________  Pain Management Discharge Instructions  General Discharge Instructions :  If you need to reach your doctor call: Monday-Friday 8:00 am - 4:00 pm at [phone] or toll free [phone].  After clinic hours [phone] to have operator reach doctor.  Bring all of your medication bottles to all your appointments in the pain clinic.  To cancel or reschedule your appointment with Pain Management please remember to call 24 hours in advance to avoid a fee.  Refer to the educational materials which you have been given on: General Risks, I had my Procedure. Discharge Instructions, Post Sedation.  Post Procedure  Instructions:  The drugs you were given will stay in your system until tomorrow, so for the next 24 hours you should not drive, make any legal decisions or drink any alcoholic beverages.  You may eat anything you prefer, but it is better to start with liquids then soups and crackers, and gradually work up to solid foods.  Please notify your doctor immediately if you have any unusual bleeding, trouble breathing or pain that is not related to your normal pain.  Depending on the type of procedure that was done, some parts of your body may feel week and/or numb.  This usually clears up by tonight or the next day.  Walk with the use of an assistive device or accompanied by an adult for the 24 hours.  You may use ice on the affected area for the first 24 hours.  Put ice in a Ziploc bag and cover with a towel and place against area 15 minutes on 15 minutes off.  You may switch to heat after 24 hours.Epidural Steroid Injection Patient Information  Description: The epidural space surrounds the nerves as they exit the spinal cord.  In some patients, the nerves can be compressed and inflamed by a bulging disc or a tight spinal canal (spinal stenosis).  By injecting steroids into the epidural space, we can bring irritated nerves into direct contact with a potentially helpful medication.  These steroids act directly on the irritated nerves and can reduce swelling and inflammation which often leads to decreased pain.  Epidural steroids may be injected anywhere along the  spine and from the neck to the low back depending upon the location of your pain.   After numbing the skin with local anesthetic (like Novocaine), a small needle is passed into the epidural space slowly.  You may experience a sensation of pressure while this is being done.  The entire block usually last less than 10 minutes.  Conditions which may be treated by epidural steroids:  Low back and leg pain Neck and arm pain Spinal  stenosis Post-laminectomy syndrome Herpes zoster (shingles) pain Pain from compression fractures  Preparation for the injection:  Do not eat any solid food or dairy products within 8 hours of your appointment.  You may drink clear liquids up to 3 hours before appointment.  Clear liquids include water, black coffee, juice or soda.  No milk or cream please. You may take your regular medication, including pain medications, with a sip of water before your appointment  Diabetics should hold regular insulin (if taken separately) and take 1/2 normal NPH dos the morning of the procedure.  Carry some sugar containing items with you to your appointment. A driver must accompany you and be prepared to drive you home after your procedure.  Bring all your current medications with your. An IV may be inserted and sedation may be given at the discretion of the physician.   A blood pressure cuff, EKG and other monitors will often be applied during the procedure.  Some patients may need to have extra oxygen administered for a short period. You will be asked to provide medical information, including your allergies, prior to the procedure.  We must know immediately if you are taking blood thinners (like Coumadin/Warfarin)  Or if you are allergic to IV iodine contrast (dye). We must know if you could possible be pregnant.  Possible side-effects: Bleeding from needle site Infection (rare, may require surgery) Nerve injury (rare) Numbness & tingling (temporary) Difficulty urinating (rare, temporary) Spinal headache ( a headache worse with upright posture) Light -headedness (temporary) Pain at injection site (several days) Decreased blood pressure (temporary) Weakness in arm/leg (temporary) Pressure sensation in back/neck (temporary)  Call if you experience: Fever/chills associated with headache or increased back/neck pain. Headache worsened by an upright position. New onset weakness or numbness of an  extremity below the injection site Hives or difficulty breathing (go to the emergency room) Inflammation or drainage at the infection site Severe back/neck pain Any new symptoms which are concerning to you  Please note:  Although the local anesthetic injected can often make your back or neck feel good for several hours after the injection, the pain will likely return.  It takes 3-7 days for steroids to work in the epidural space.  You may not notice any pain relief for at least that one week.  If effective, we will often do a series of three injections spaced 3-6 weeks apart to maximally decrease your pain.  After the initial series, we generally will wait several months before considering a repeat injection of the same type.  If you have any questions, please call [phone] [facility]

## 2022-04-02 ENCOUNTER — Telehealth: Payer: Self-pay

## 2022-04-02 NOTE — Telephone Encounter (Signed)
Patient follow up phone call.  States she is doing well.

## 2022-04-14 ENCOUNTER — Ambulatory Visit (INDEPENDENT_AMBULATORY_CARE_PROVIDER_SITE_OTHER): Payer: BC Managed Care – PPO | Admitting: Neurosurgery

## 2022-04-14 ENCOUNTER — Encounter: Payer: Self-pay | Admitting: Neurosurgery

## 2022-04-14 VITALS — BP 128/76 | Ht 63.0 in | Wt 148.2 lb

## 2022-04-14 DIAGNOSIS — M5412 Radiculopathy, cervical region: Secondary | ICD-10-CM

## 2022-04-14 DIAGNOSIS — M4012 Other secondary kyphosis, cervical region: Secondary | ICD-10-CM | POA: Diagnosis not present

## 2022-04-14 DIAGNOSIS — R29898 Other symptoms and signs involving the musculoskeletal system: Secondary | ICD-10-CM

## 2022-04-14 NOTE — Progress Notes (Signed)
Referring Physician:  Meade Maw, [address] [place],  [place] [id number]  Primary Physician:  Juline Patch, MD  History of Present Illness: 04/14/2022 [name] is here today with a chief complaint of neck and left arm pain.  She has been having neck pain for quite a period of time.  It is currently 6 out of 10 and interfering with her day-to-day life.  She also has pain into her left shoulder blade and down her left arm.  Her epidural steroid injection listed below did help somewhat with this, though her pain is still impacting her day-to-day life.  She also reports weakness in her left arm as well as some issues with numbness in her hands and feet.  04/01/22 - L C7-T1 ESI  12/11/2021  [name] is doing reasonably well. Her pain is currently 2 out of 10. It was much worse previously.  10/30/21 [name] is here today with a chief complaint of neck pain that radiates into the bilateral shoulders and arms, with the left side much worse than the right. Along with intermittent tingling in the bilateral arms   She has been having neck pain for 1 year. She reports sharp and throbbing pain with sitting and then activity. She has noticed a change in position of her neck and she is having trouble with looking upwards. Most activities make her pain worse. She is also having pain into her left arm and down to all 5 of her fingers.  Bowel/Bladder Dysfunction: none  Conservative measures:  Physical therapy: has participated in 3 PT visits at [facility] (04/03/21 to 04/21/21) with no relief Multimodal medical therapy including regular antiinflammatories: hydrocodone, ibuprofen, robaxin Injections: has had epidural steroid injections 09/29/21: C7-T1 [place] ESI (Dr. Holley Raring) no relief 08/06/21: C7-T1 IL ESI (Dr. Holley Raring) 3 months relief  Past Surgery:  Lumbar Fusion in 2010  Elizabeth Torres has no symptoms of cervical myelopathy.  The symptoms are causing a significant impact on  the patient's life.   Review of Systems:  A 10 point review of systems is negative, except for the pertinent positives and negatives detailed in the HPI.  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 RAd and chemo tx   BREAST SURGERY     mastectomy Right    partial with lymph node dissection   PORT A CATH REVISION     PORT-A-CATH REMOVAL  11-07-15   Dr Bary Castilla   SHOULDER ARTHROSCOPY WITH ROTATOR CUFF REPAIR Left 05/18/2019   Procedure: SHOULDER ARTHROSCOPY WITH SUBACROMIAL DECOMPRESSION AND DISTAL CLAVICAL EXCISION, INTRAARTICULAR DEBRIDEMENT;  Surgeon: Lovell Sheehan, MD;  Location: [facility];  Service: Orthopedics;  Laterality: Left;   SPINE SURGERY  2008   Dr Joya Salm   TUBAL LIGATION      Allergies: Allergies as of 04/14/2022 -  Review Complete 04/01/2022  Allergen Reaction Noted   Penicillins Anaphylaxis 08/10/2011   Pregabalin Other (See Comments) 07/08/2021   Sulfa antibiotics Anaphylaxis 08/10/2011   Ondansetron  03/12/2015   Other  08/26/2020   Zofran [ondansetron hcl]  02/19/2015    Medications: No outpatient medications have been marked as taking for the 04/14/22 encounter (Appointment) with Meade Maw, MD.    Social History: Social History   Tobacco Use   Smoking status: Former    Packs/day: 1.00    Years: 20.00    Total pack years: 20.00    Types: Cigarettes    Quit date: 01/08/2020     Years since quitting: 2.2   Smokeless tobacco: Never  Vaping Use   Vaping Use: Never used  Substance Use Topics   Alcohol use: Not Currently   Drug use: Never    Family Medical History: Family History  Problem Relation Age of Onset   Diabetes Mother    Hypertension Mother    Pulmonary fibrosis Mother    Cancer Paternal Uncle    Breast cancer Paternal Uncle        24'S   Breast cancer Paternal Aunt        63'S   Other Father        unknown medical history    Physical Examination: There were no vitals filed for this visit.  General: Patient is well developed, well nourished, calm, collected, and in no apparent distress. Attention to examination is appropriate.  Neck:   Supple.  Limited range of motion.  Respiratory: Patient is breathing without any difficulty.   NEUROLOGICAL:     Awake, alert, oriented to person, place, and time.  Speech is clear and fluent. Fund of knowledge is appropriate.   Cranial Nerves: Pupils equal round and reactive to light.  Facial tone is symmetric.  Facial sensation is symmetric. Shoulder shrug is symmetric. Tongue protrusion is midline.  There is no pronator drift.  ROM of spine: full.    Strength: Side Biceps Triceps Deltoid Interossei Grip Wrist Ext. Wrist Flex.  R '5 5 5 5 5 5 5  ' L 4 4 4- '4 4 5 5   ' Side Iliopsoas Quads Hamstring PF DF EHL  R '5 5 5 5 5 5  ' L '5 5 5 5 5 5   ' Reflexes are 1+ and symmetric at the biceps, triceps, brachioradialis, patella and achilles.   Hoffman's is absent.  Clonus is not present.  Toes are down-going.  Bilateral upper and lower extremity sensation is intact to light touch.    No evidence of dysmetria noted.  Gait is normal.     Medical Decision Making  Imaging: MRI C spine 04/29/2021  IMPRESSION:  1. Disc degeneration with reversal of cervical lordosis that has  progressed from a 2011 myelogram.  2. Disc protrusions contact the ventral cord at C5-6 and C6-7.  Diffusely patent spinal canal.   3. Right foraminal impingement at C4-5 and especially C5-6.  4. Moderate left foraminal impingement at C5-6.   Electronically Signed    By: Jorje Guild M.D.    On: 04/30/2021 07:49  C spine xrays 01/31/21 IMPRESSION:  Mid cervical degenerative changes.  Loss of cervical lordosis.   Electronically Signed    By: Nolon Nations M.D.    On: 02/03/2021 13:28  Please note that she has a 23 degree kyphosis between the inferior endplate of C2 and inferior endplate of C7. This is increased from a 15 degree kyphosis from her  x-rays in 2017  I have personally reviewed the images and agree with the above interpretation.  Assessment and Plan: [name] is a pleasant 46 y.o. 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 This is centered between C4 and C7. She also has foraminal disc disease and radicular symptoms.  Since my last visit with her, her weakness is worsened.  I recommended that we consider moving forward with surgical intervention as she has not tried physical therapy and injections without improvement and has worsening symptoms.  To finalize a surgical plan, I think we should get flexion-extension x-rays and repeat her MRI scan, as it is currently out of date and she has new symptoms since the last time it was evaluated over a year ago.  I have asked that she discontinue smoking. I think she is likely to need a reconstruction with a 3 level anterior cervical discectomy and fusion between C4 and C7 to reestablish her cervical lordosis if her imaging findings are consistent with previous.  I spent a total of 10 minutes in both face-to-face and non-face-to-face activities for this visit on the date of this encounter.  Thank you for involving me in the care of this patient.       Elizabeth Torres K. Izora Ribas MD, [facility] Neurosurgery

## 2022-04-21 ENCOUNTER — Telehealth: Payer: Self-pay | Admitting: Registered Nurse

## 2022-04-21 MED ORDER — HYDROCODONE-ACETAMINOPHEN 10-325 MG PO TABS
1.0000 | ORAL_TABLET | Freq: Every day | ORAL | 0 refills | Status: DC | PRN
Start: 2022-04-21 — End: 2022-04-23

## 2022-04-21 NOTE — Telephone Encounter (Signed)
PMP was Reviewed.  Hydrocodone e-scribed today.  Elizabeth Torres is aware of the above, and verbalizes understanding. She was instructed to call office to scheduled an appointment. Marland Kitchen

## 2022-04-23 ENCOUNTER — Other Ambulatory Visit: Payer: Self-pay | Admitting: Registered Nurse

## 2022-04-23 ENCOUNTER — Telehealth: Payer: Self-pay | Admitting: Registered Nurse

## 2022-04-23 MED ORDER — HYDROCODONE-ACETAMINOPHEN 10-325 MG PO TABS
1.0000 | ORAL_TABLET | Freq: Every day | ORAL | 0 refills | Status: DC | PRN
Start: 2022-04-23 — End: 2022-05-06

## 2022-04-23 NOTE — Telephone Encounter (Signed)
PMP was Reviewed Ambien e-scribed today  

## 2022-04-23 NOTE — Telephone Encounter (Signed)
PMP was Reviewed.  Hydrocodone e-scribed today to [facility] as patient requested.  My-Chart message sent to Ms. Rudin regarding the above.

## 2022-04-29 ENCOUNTER — Ambulatory Visit (HOSPITAL_BASED_OUTPATIENT_CLINIC_OR_DEPARTMENT_OTHER): Payer: BC Managed Care – PPO | Admitting: Student in an Organized Health Care Education/Training Program

## 2022-04-29 ENCOUNTER — Encounter: Payer: Self-pay | Admitting: Student in an Organized Health Care Education/Training Program

## 2022-04-29 DIAGNOSIS — M5412 Radiculopathy, cervical region: Secondary | ICD-10-CM | POA: Diagnosis not present

## 2022-04-29 DIAGNOSIS — G894 Chronic pain syndrome: Secondary | ICD-10-CM | POA: Diagnosis not present

## 2022-04-29 DIAGNOSIS — M4802 Spinal stenosis, cervical region: Secondary | ICD-10-CM | POA: Diagnosis not present

## 2022-04-29 NOTE — Progress Notes (Signed)
Patient: Elizabeth Torres  Service Category: E/M  Provider: Gillis Santa, MD  DOB: Oct 12, 1975  DOS: 04/29/2022  Location: Office  MRN: [id number]  Setting: Ambulatory outpatient  Referring Provider: Juline Patch, MD  Type: Established Patient  Specialty: Interventional Pain Management  PCP: Juline Patch, MD  Location: Remote location  Delivery: TeleHealth     Virtual Encounter - Pain Management PROVIDER NOTE: Information contained herein reflects review and annotations entered in association with encounter. Interpretation of such information and data should be left to medically-trained personnel. Information provided to patient can be located elsewhere in the medical record under "Patient Instructions". Document created using STT-dictation technology, any transcriptional errors that may result from process are unintentional.    Contact & Pharmacy Preferred: [phone] Home: [phone] (home) Mobile: [phone] (mobile) E-mail: [email or web address] [place], [place] [address] [place] [address] Phone: [phone] Fax: [phone]  [id number] [address], [place] [address] Phone: [phone] Fax: [phone]   Pre-screening  Elizabeth Torres offered "in-person" vs "virtual" encounter. She indicated preferring virtual for this encounter.   Reason COVID-19*  Social distancing based on CDC and AMA recommendations.   I contacted Elizabeth Torres on 04/29/2022 via telephone.      I clearly identified myself as Gillis Santa, MD. I verified that I was speaking with the correct person using two identifiers (Name: ARIYANNAH PAULING, and date of birth: 1976-03-28).  Consent I sought verbal advanced consent from Elizabeth Torres for virtual visit interactions. I informed Elizabeth Torres of possible security and privacy concerns, risks, and limitations associated with providing  "not-in-person" medical evaluation and management services. I also informed Elizabeth Torres of the availability of "in-person" appointments. Finally, I informed her that there would be a charge for the virtual visit and that she could be  personally, fully or partially, financially responsible for it. Elizabeth Torres expressed understanding and agreed to proceed.   Historic Elements   Elizabeth Torres is a 46 y.o. year old, female patient evaluated today after our last contact on 04/01/2022. Elizabeth Torres  has a past medical history of BRCA negative, Breast cancer ([place]) (2013), Degenerative disc disease, lumbar, Personal history of chemotherapy (2013), Personal history of radiation therapy (2013), Radiation (2013), and Status post chemotherapy (2013). She also  has a past surgical history that includes Spine surgery (2008); mastectomy (Right); Breast surgery; Port a cath revision; Port-a-cath removal (11-07-15); Breast lumpectomy (Right, 08/2011); Breast biopsy (Right, 07/2011); Tubal ligation; and Shoulder arthroscopy with rotator cuff repair (Left, 05/18/2019). Elizabeth Torres has a current medication list which includes the following prescription(s): hydrochlorothiazide, hydrocodone-acetaminophen, ibuprofen, methocarbamol, minoxidil, nortriptyline, vitamin d (ergocalciferol), and zolpidem. She  reports that she quit smoking about 2 years ago. Her smoking use included cigarettes. She has a 20.00 pack-year smoking history. She has never used smokeless tobacco. She reports that she does not currently use alcohol. She reports that she does not use drugs. Elizabeth Torres is allergic to penicillins, pregabalin, sulfa antibiotics, ondansetron, other, and zofran [ondansetron hcl].   HPI  Today, she is being contacted for a post-procedure assessment.   Post-procedure evaluation   Interlaminar Cervical Epidural Steroid injection (ESI) #3 (#1 done 08/06/21, #2 09/29/2021) Laterality: Left Level: C7-T1 Imaging: Fluoroscopy-assisted DOS:  04/01/2022 Performed by: Gillis Santa, MD Anesthesia: Local anesthesia (1-2% Lidocaine) Anxiolysis: None Sedation: None.  Purpose: Diagnostic/Therapeutic Indications: Cervicalgia, cervical radicular pain, degenerative disc disease, severe enough to impact quality of life or function. 1. Cervical radicular pain   2. Spinal stenosis in cervical region   3. Chronic pain syndrome     NAS-11 score:   Pre-procedure: 9 /10   Post-procedure: 9 /10     Effectiveness:  Initial hour after procedure: 50 %  Subsequent 4-6 hours post-procedure: 70 %  Analgesia past initial 6 hours: 90 %  Ongoing improvement:  Analgesic:  90% Function: Elizabeth Torres reports improvement in function ROM: Elizabeth Torres reports improvement in ROM   Laboratory Chemistry Profile   Renal Lab Results  Component Value Date   BUN 15 01/20/2022   CREATININE 0.71 01/20/2022   LABCREA 12.15 (L) 04/17/2015   BCR 21 01/20/2022   GFRAA 124 01/26/2020   GFRNONAA 107 01/26/2020    Hepatic Lab Results  Component Value Date   AST 23 01/26/2020   ALT 26 01/26/2020   ALBUMIN 4.7 01/20/2022   ALKPHOS 57 01/26/2020    Electrolytes Lab Results  Component Value Date   NA 138 01/20/2022   K 4.2 01/20/2022   CL 98 01/20/2022   CALCIUM 9.9 01/20/2022   PHOS 3.4 01/20/2022    Bone No results found for: "VD25OH", "VD125OH2TOT", "SM8406RE6", "JE8307PH4", "25OHVITD1", "25OHVITD2", "25OHVITD3", "TESTOFREE", "TESTOSTERONE"  Inflammation (CRP: Acute Phase) (ESR: Chronic Phase) Lab Results  Component Value Date   ESRSEDRATE 15 01/06/2010         Note: Above Lab results reviewed.  Assessment  The primary encounter diagnosis was Cervical radicular pain. Diagnoses of Spinal stenosis in cervical region and Chronic pain syndrome were also pertinent to this visit.  Plan of Care  Excellent analgesic and functional response after previous cervical ESI.  Continue to monitor symptoms.  Repeat as needed.    Follow-up plan:   Return  if symptoms worsen or fail to improve.    Recent Visits Date Type Provider Dept  04/01/22 Procedure visit Gillis Santa, MD [facility]  03/26/22 Office Visit Gillis Santa, MD [facility]  Showing recent visits within past 90 days and meeting all other requirements Today's Visits Date Type Provider Dept  04/29/22 Office Visit Gillis Santa, MD [facility]  Showing today's visits and meeting all other requirements Future Appointments No visits were found meeting these conditions. Showing future appointments within next 90 days and meeting all other requirements  I discussed the assessment and treatment plan with the patient. The patient was provided an opportunity to ask questions and all were answered. The patient agreed with the plan and demonstrated an understanding of the instructions.  Patient advised to call back or seek an in-person evaluation if the symptoms or condition worsens.  Duration of encounter: 79mnutes.  Note by: BGillis Santa MD Date: 04/29/2022; Time: 3:29 PM

## 2022-04-30 ENCOUNTER — Ambulatory Visit
Admission: RE | Admit: 2022-04-30 | Discharge: 2022-04-30 | Disposition: A | Discharge status: Home / Self Care | Payer: BC Managed Care – PPO | Source: Ambulatory Visit | Attending: Neurosurgery | Admitting: Neurosurgery

## 2022-04-30 ENCOUNTER — Ambulatory Visit
Admission: RE | Admit: 2022-04-30 | Discharge: 2022-04-30 | Disposition: A | Discharge status: Home / Self Care | Payer: BC Managed Care – PPO | Source: Home / Self Care | Attending: Neurosurgery | Admitting: Neurosurgery

## 2022-04-30 DIAGNOSIS — M5412 Radiculopathy, cervical region: Secondary | ICD-10-CM

## 2022-04-30 DIAGNOSIS — M4802 Spinal stenosis, cervical region: Secondary | ICD-10-CM | POA: Diagnosis not present

## 2022-04-30 DIAGNOSIS — M4012 Other secondary kyphosis, cervical region: Secondary | ICD-10-CM | POA: Insufficient documentation

## 2022-04-30 DIAGNOSIS — G894 Chronic pain syndrome: Secondary | ICD-10-CM | POA: Diagnosis not present

## 2022-04-30 DIAGNOSIS — M47812 Spondylosis without myelopathy or radiculopathy, cervical region: Secondary | ICD-10-CM | POA: Diagnosis not present

## 2022-04-30 DIAGNOSIS — R29898 Other symptoms and signs involving the musculoskeletal system: Secondary | ICD-10-CM | POA: Insufficient documentation

## 2022-04-30 DIAGNOSIS — M4312 Spondylolisthesis, cervical region: Secondary | ICD-10-CM | POA: Diagnosis not present

## 2022-04-30 DIAGNOSIS — M542 Cervicalgia: Secondary | ICD-10-CM | POA: Diagnosis not present

## 2022-05-04 ENCOUNTER — Telehealth: Payer: Self-pay

## 2022-05-04 NOTE — Telephone Encounter (Signed)
I spoke with Elizabeth Torres. She is seeing PM&R for her low back. She is interested in surgery. She would like an in person appt with her husband to discuss surgery. She requested a morning appointment due to her work schedule. Appt made for 10/17.

## 2022-05-04 NOTE — Telephone Encounter (Signed)
Left message to return call 

## 2022-05-04 NOTE — Telephone Encounter (Signed)
-----   Message from Meade Maw, MD sent at 05/04/2022 10:23 AM EDT ----- Looks like she is seeing PMR on Wednesday.  I had suggested considering surgery.  I still think C4-7 ACDF would be appropriate.   Can you touch base with her to see if she'd like to move forward? Or I can do a telephone visit tomorrow or Thursday  ----- Message ----- From: Interface, Rad Results In Sent: 05/01/2022   4:34 PM EDT To: Meade Maw, MD

## 2022-05-06 ENCOUNTER — Encounter: Payer: BC Managed Care – PPO | Attending: Physical Medicine and Rehabilitation | Admitting: Registered Nurse

## 2022-05-06 ENCOUNTER — Encounter: Payer: Self-pay | Admitting: Registered Nurse

## 2022-05-06 VITALS — BP 115/81 | HR 90 | Ht 63.0 in | Wt 154.2 lb

## 2022-05-06 DIAGNOSIS — Z5181 Encounter for therapeutic drug level monitoring: Secondary | ICD-10-CM

## 2022-05-06 DIAGNOSIS — M5412 Radiculopathy, cervical region: Secondary | ICD-10-CM

## 2022-05-06 DIAGNOSIS — Z79891 Long term (current) use of opiate analgesic: Secondary | ICD-10-CM | POA: Diagnosis not present

## 2022-05-06 DIAGNOSIS — G620 Drug-induced polyneuropathy: Secondary | ICD-10-CM | POA: Diagnosis not present

## 2022-05-06 DIAGNOSIS — M542 Cervicalgia: Secondary | ICD-10-CM | POA: Diagnosis not present

## 2022-05-06 DIAGNOSIS — M961 Postlaminectomy syndrome, not elsewhere classified: Secondary | ICD-10-CM | POA: Diagnosis not present

## 2022-05-06 DIAGNOSIS — M5416 Radiculopathy, lumbar region: Secondary | ICD-10-CM

## 2022-05-06 DIAGNOSIS — G4700 Insomnia, unspecified: Secondary | ICD-10-CM | POA: Diagnosis not present

## 2022-05-06 DIAGNOSIS — G894 Chronic pain syndrome: Secondary | ICD-10-CM

## 2022-05-06 DIAGNOSIS — T451X5A Adverse effect of antineoplastic and immunosuppressive drugs, initial encounter: Secondary | ICD-10-CM | POA: Diagnosis not present

## 2022-05-06 MED ORDER — HYDROCODONE-ACETAMINOPHEN 10-325 MG PO TABS: 1.0000 | ORAL_TABLET | Freq: Every day | ORAL | 0 refills | Status: DC | PRN

## 2022-05-06 NOTE — Progress Notes (Signed)
Subjective:    Patient ID: Elizabeth Torres, female    DOB: 1976/02/21, 46 y.o.   MRN: [id number]  HPI: Elizabeth Torres is a 46 y.o. female who returns for follow up appointment for chronic pain and medication refill. She states her pain is located in her neck radiating into her bilateral shoulders and lower back pain radiating into her bilateral lower extremities.. She  rates her pain 6. Her current exercise regime is walking and performing stretching exercises.  Ms. Even Morphine equivalent is 50.00 MME.   Last UDS was Performed on 01/13/2022, it was consistent.    Pain Inventory Average Pain 6 Pain Right Now 6 My pain is constant and aching  In the last 24 hours, has pain interfered with the following? General activity 5 Relation with others 5 Enjoyment of life 7 What TIME of day is your pain at its worst? evening Sleep (in general) Poor  Pain is worse with: inactivity and some activites Pain improves with: rest, heat/ice, and medication Relief from Meds: 4  Family History  Problem Relation Age of Onset  . Diabetes Mother   . Hypertension Mother   . Pulmonary fibrosis Mother   . Cancer Paternal Uncle   . Breast cancer Paternal Uncle        40'S  . Breast cancer Paternal Aunt        60'S  . Other Father        unknown medical history   Social History   Socioeconomic History  . Marital status: Married    Spouse name: Elizabeth Torres  . Number of children: 3  . Years of education: Not on file  . Highest education level: Not on file  Occupational History  . Not on file  Tobacco Use  . Smoking status: Former    Packs/day: 1.00    Years: 20.00    Total pack years: 20.00    Types: Cigarettes    Quit date: 01/08/2020    Years since quitting: 2.3  . Smokeless tobacco: Never  Vaping Use  . Vaping Use: Never used  Substance and Sexual Activity  . Alcohol use: Not Currently  . Drug use: Never  . Sexual activity: Yes    Partners: Male  Other Topics Concern  . Not on file   Social History Narrative  . Not on file   Social Determinants of Health   Financial Resource Strain: Not on file  Food Insecurity: Not on file  Transportation Needs: Not on file  Physical Activity: Not on file  Stress: Not on file  Social Connections: Not on file   Past Surgical History:  Procedure Laterality Date  . BREAST BIOPSY Right 07/2011   invasive ductal carcinoma  . 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 RAd and chemo tx  . BREAST SURGERY    . mastectomy Right    partial with lymph node dissection  . PORT A CATH REVISION    . PORT-A-CATH REMOVAL  11-07-15   Dr Bary Castilla  . SHOULDER ARTHROSCOPY WITH ROTATOR CUFF REPAIR Left 05/18/2019   Procedure: SHOULDER ARTHROSCOPY WITH SUBACROMIAL DECOMPRESSION AND DISTAL CLAVICAL EXCISION, INTRAARTICULAR DEBRIDEMENT;  Surgeon: Lovell Sheehan, MD;  Location: [facility];  Service: Orthopedics;  Laterality: Left;  . SPINE SURGERY  2008   Dr Joya Salm  . TUBAL LIGATION     Past Surgical History:  Procedure Laterality Date  . BREAST BIOPSY Right 07/2011   invasive ductal carcinoma  .  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 RAd and chemo tx  . BREAST SURGERY    . mastectomy Right    partial with lymph node dissection  . PORT A CATH REVISION    . PORT-A-CATH REMOVAL  11-07-15   Dr Bary Castilla  . SHOULDER ARTHROSCOPY WITH ROTATOR CUFF REPAIR Left 05/18/2019   Procedure: SHOULDER ARTHROSCOPY WITH SUBACROMIAL DECOMPRESSION AND DISTAL CLAVICAL EXCISION, INTRAARTICULAR DEBRIDEMENT;  Surgeon: Lovell Sheehan, MD;  Location: [facility];  Service: Orthopedics;  Laterality: Left;  . SPINE SURGERY  2008   Dr Joya Salm  . TUBAL LIGATION     Past Medical History:  Diagnosis Date  . BRCA negative   . Breast cancer ([place]) 2013   RT LUMPECTOMY with chemo and rad tx  . Degenerative disc disease, lumbar   . Personal history of chemotherapy 2013    for breast ca  . Personal history of radiation therapy 2013   F/U right breast cancer   . Radiation 2013   BREAST CA  . Status post chemotherapy 2013   BREAST CA   BP 115/81   Pulse 90   Ht '5\' 3"'  (1.6 m)   Wt 154 lb 3.2 oz (69.9 kg)   SpO2 95%   BMI 27.32 kg/m   Opioid Risk Score:   Fall Risk Score:  `1  Depression screen [facility] 2/9     05/06/2022    9:53 AM 03/26/2022    8:06 AM 03/17/2022   10:02 AM 02/10/2022    9:54 AM 01/20/2022   10:06 AM 01/13/2022   11:01 AM 12/17/2021   10:42 AM  Depression screen PHQ 2/9  Decreased Interest 0 0 0 0 0 0 0  Down, Depressed, Hopeless 0 0 0 0 0 0 0  PHQ - 2 Score 0 0 0 0 0 0 0  Altered sleeping     2  1  Tired, decreased energy     1  1  Change in appetite     0  0  Feeling bad or failure about yourself      0  0  Trouble concentrating     0  0  Moving slowly or fidgety/restless     0  0  Suicidal thoughts     0  0  PHQ-9 Score     3  2  Difficult doing work/chores     Not difficult at all  Not difficult at all      Review of Systems  Musculoskeletal:  Positive for back pain and neck pain.       Bilateral leg pain  Left arm pain   All other systems reviewed and are negative.     Objective:   Physical Exam Vitals and nursing note reviewed.  Constitutional:      Appearance: Normal appearance.  Neck:     Comments: Cervical Paraspinal Tenderness: C-5-C-6 Cardiovascular:     Rate and Rhythm: Normal rate and regular rhythm.     Pulses: Normal pulses.     Heart sounds: Normal heart sounds.  Pulmonary:     Effort: Pulmonary effort is normal.     Breath sounds: Normal breath sounds.  Musculoskeletal:     Cervical back: Normal range of motion and neck supple.     Comments: Normal Muscle Bulk and Muscle Testing Reveals:  Upper Extremities: Right: Full ROM and Muscle Strength 5/5 Left Upper Extremity: Decreased ROM 45 Degrees and Muscle Strength 4/5 Thoracic  Hypersensitivity: T-1-T-4 Mainly Right Side Lumbar Paraspinal  Tenderness: L-3-L-5 Lower Extremities: Full ROM and Muscle Strength 5/5 Arises from Table slowly Narrow Based  Gait     Skin:    General: Skin is warm and dry.  Neurological:     Mental Status: She is alert and oriented to person, place, and time.  Psychiatric:        Mood and Affect: Mood normal.        Behavior: Behavior normal.         Assessment & Plan:  1. Lumbar postlaminectomy syndrome status post L5-S1 fusion with chronic S1 radiculopathy. Continue current medication regimen with Nortriptyline. 05/06/2022. Refilled :  Hydrocodone 10/325 mg #145-one every 6 hours as needed for pain, may take an extra tablet on work days only. 05/06/2022. We will continue the opioid monitoring program, this consists of regular clinic visits, examinations, urine drug screen, pill counts as well as use of [place] Controlled Substance Reporting system. A 12 month History has been reviewed on the [place] on 05/06/2022. 2. 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 05/06/2022 3. Insomnia: Continue current medication regimen Ambien. 05/06/2022  4.Chronic Left Shoulder Pain: No complaints today: S/P Left Shoulder Arthroscopy with Rotator Cuff Repair on 05/18/2019 by Dr. Gunnar Fusi : Ortho Following. 05/06/2022 5. Cervicalgia/ Cervical Radiculitis: Continue Pamelor. Ms. Nicklas has an appointment scheduled with Dr Izora Ribas on 05/19/2022, she will send this provider an update via My chart message, she verbalizes understanding. . We will continue to monitor.  05/06/2022   F/U in 1 month.

## 2022-05-15 IMAGING — CR DG HUMERUS 2V *L*
2 series · 2 of 2 positions shown · non-contrast
Comparison: None.

CLINICAL DATA: LEFT UPPER extremity deformity. Suspect biceps
tendon tear. RIGHT neck pain radiates into the RIGHT trapezius.
Obvious deformity anterior distal humerus about the elbow, chronic.

EXAM:
LEFT HUMERUS - 2+ VIEW

[humerus ap]
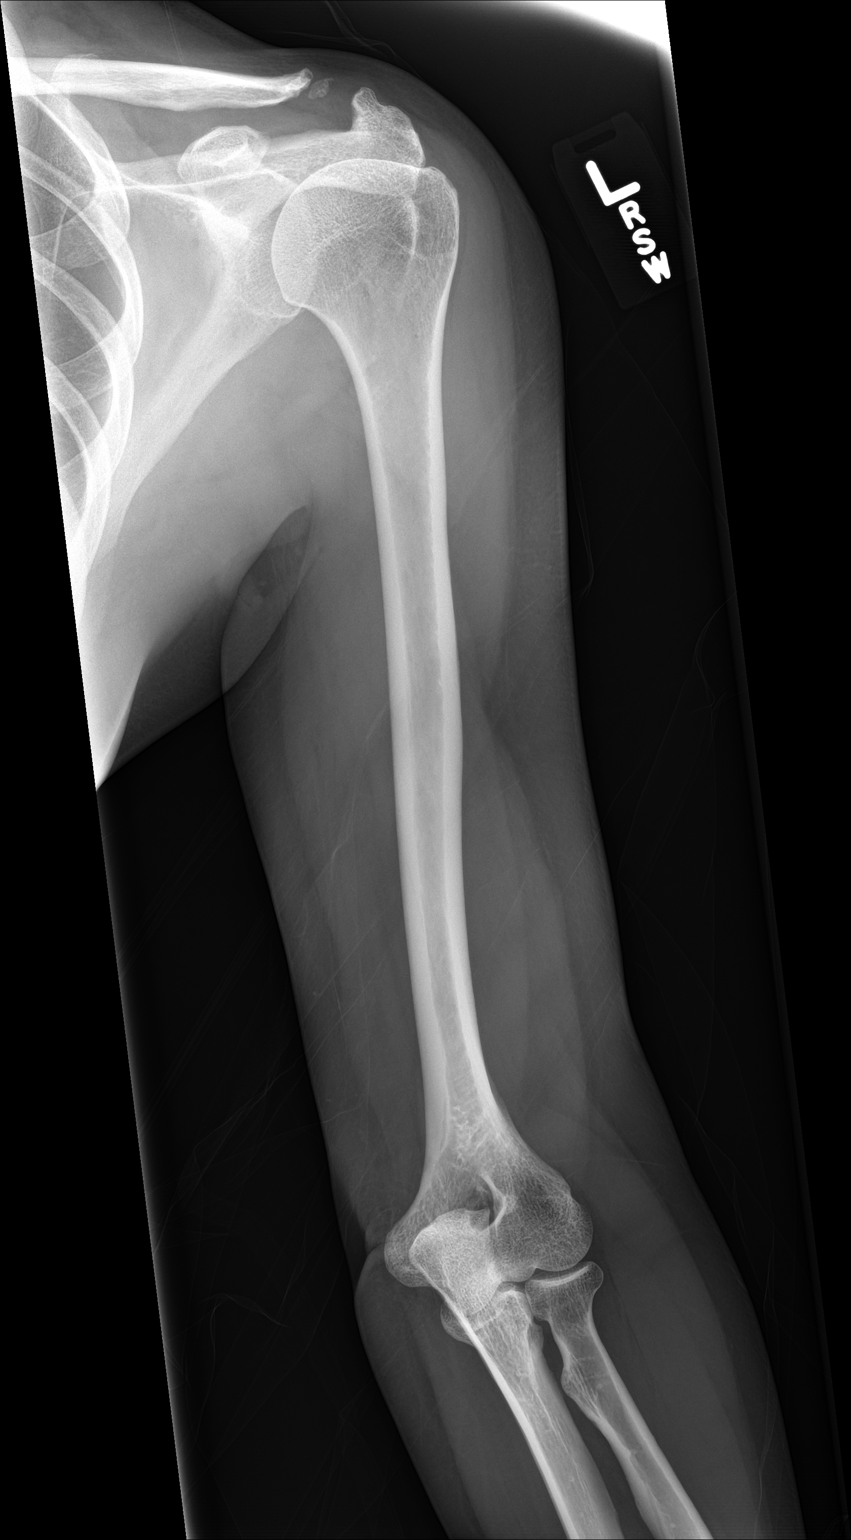

[humerus lat]
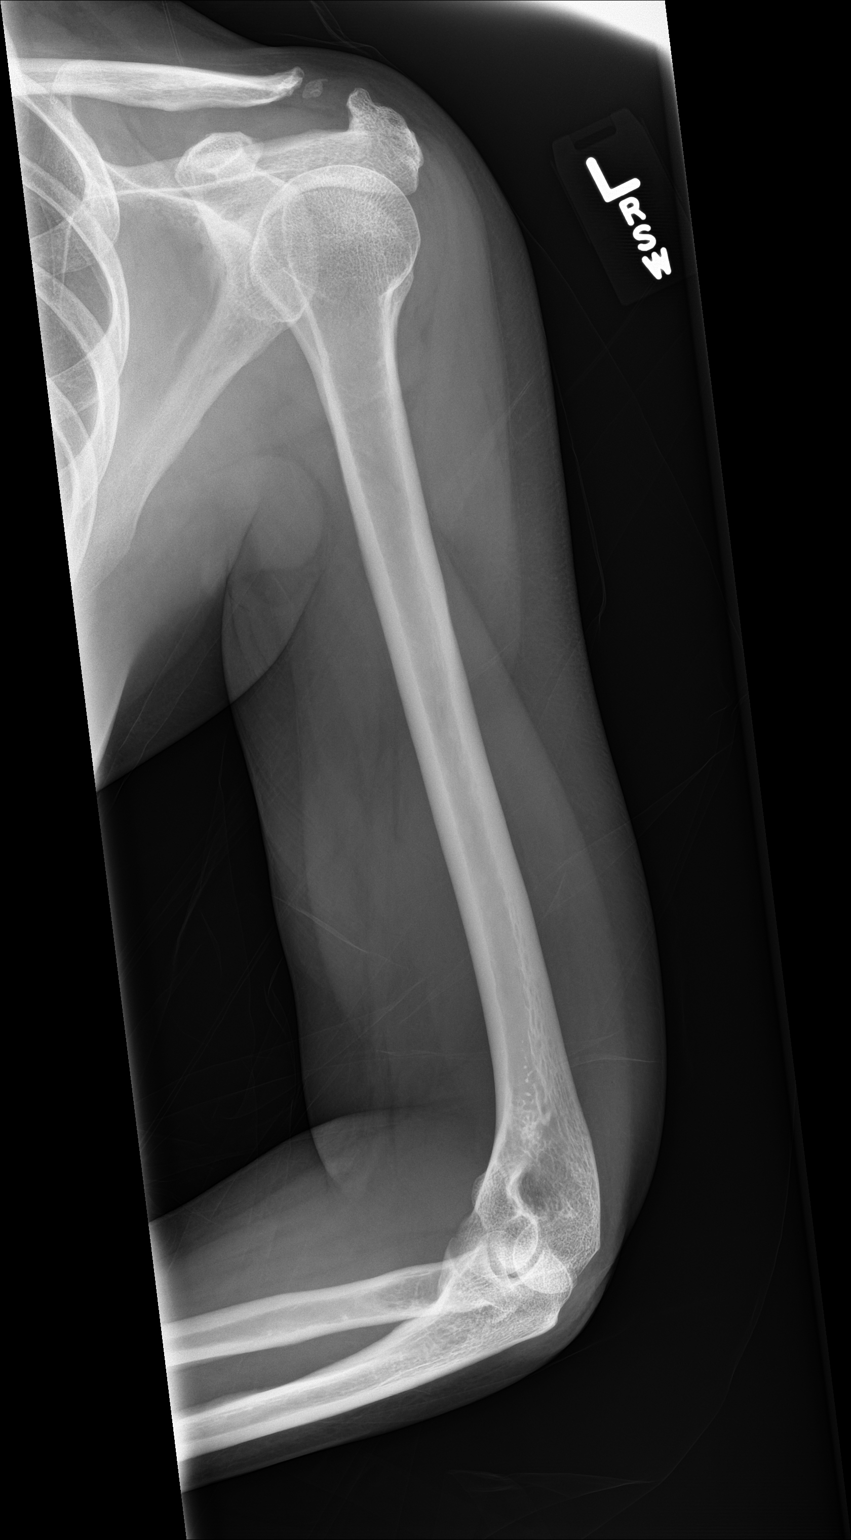

[2 of 2 positions shown; findings below may reference images not displayed]

FINDINGS: Humerus is intact. No evidence for dislocation. Chronic changes in
the distal LEFT clavicle consistent with resorption/resection. Soft
tissues are unremarkable.
IMPRESSION: No evidence for acute  abnormality.

## 2022-05-19 ENCOUNTER — Ambulatory Visit (INDEPENDENT_AMBULATORY_CARE_PROVIDER_SITE_OTHER): Payer: BC Managed Care – PPO | Admitting: Neurosurgery

## 2022-05-19 ENCOUNTER — Encounter: Payer: Self-pay | Admitting: Neurosurgery

## 2022-05-19 ENCOUNTER — Other Ambulatory Visit: Payer: Self-pay | Admitting: Registered Nurse

## 2022-05-19 VITALS — BP 126/82 | Ht 63.0 in | Wt 152.0 lb

## 2022-05-19 DIAGNOSIS — M5412 Radiculopathy, cervical region: Secondary | ICD-10-CM

## 2022-05-19 DIAGNOSIS — M4012 Other secondary kyphosis, cervical region: Secondary | ICD-10-CM

## 2022-05-19 DIAGNOSIS — R29898 Other symptoms and signs involving the musculoskeletal system: Secondary | ICD-10-CM | POA: Diagnosis not present

## 2022-05-19 NOTE — Progress Notes (Signed)
Referring Physician:  No referring provider defined for this encounter.  Primary Physician:  Juline Patch, MD  History of Present Illness: 05/19/2022 Elizabeth Torres is doing slightly better after her recent injection, but still having weakness in her L arm.  04/14/22 [name] is here today with a chief complaint of neck and left arm pain.  She has been having neck pain for quite a period of time.  It is currently 6 out of 10 and interfering with her day-to-day life.  She also has pain into her left shoulder blade and down her left arm.  Her epidural steroid injection listed below did help somewhat with this, though her pain is still impacting her day-to-day life.  She also reports weakness in her left arm as well as some issues with numbness in her hands and feet.  04/01/22 - L C7-T1 ESI  12/11/2021  [name] is doing reasonably well. Her pain is currently 2 out of 10. It was much worse previously.  10/30/21 [name] is here today with a chief complaint of neck pain that radiates into the bilateral shoulders and arms, with the left side much worse than the right. Along with intermittent tingling in the bilateral arms   She has been having neck pain for 1 year. She reports sharp and throbbing pain with sitting and then activity. She has noticed a change in position of her neck and she is having trouble with looking upwards. Most activities make her pain worse. She is also having pain into her left arm and down to all 5 of her fingers.  Bowel/Bladder Dysfunction: none  Conservative measures:  Physical therapy: has participated in 3 PT visits at [facility] (04/03/21 to 04/21/21) with no relief Multimodal medical therapy including regular antiinflammatories: hydrocodone, ibuprofen, robaxin Injections: has had epidural steroid injections 09/29/21: C7-T1 [place] ESI (Dr. Holley Raring) no relief 08/06/21: C7-T1 IL ESI (Dr. Holley Raring) 3 months relief  Past Surgery:  Lumbar Fusion in 2010  Elizabeth Torres has  no symptoms of cervical myelopathy.  The symptoms are causing a significant impact on the patient's life.   Review of Systems:  A 10 point review of systems is negative, except for the pertinent positives and negatives detailed in the HPI.  Past Medical History: Past Medical History:  Diagnosis Date   BRCA negative    Breast cancer ([place]) 2013   RT LUMPECTOMY with chemo and rad tx   Degenerative disc disease, lumbar    Personal history of chemotherapy 2013   for breast ca   Personal history of radiation therapy 2013   F/U right breast cancer    Radiation 2013   BREAST CA   Status post chemotherapy 2013   BREAST CA    Past Surgical History: Past Surgical History:  Procedure Laterality Date   BREAST BIOPSY Right 07/2011   invasive ductal carcinoma   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 RAd and chemo tx   BREAST SURGERY     mastectomy Right    partial with lymph node dissection   PORT A CATH REVISION     PORT-A-CATH REMOVAL  11-07-15   Dr Bary Castilla   SHOULDER ARTHROSCOPY WITH ROTATOR CUFF REPAIR Left 05/18/2019   Procedure: SHOULDER ARTHROSCOPY WITH SUBACROMIAL DECOMPRESSION AND DISTAL CLAVICAL EXCISION, INTRAARTICULAR DEBRIDEMENT;  Surgeon: Lovell Sheehan, MD;  Location: [address];  Service: Orthopedics;  Laterality: Left;   SPINE SURGERY  2008   Dr Joya Salm  TUBAL LIGATION      Allergies: Allergies as of 05/19/2022 - Review Complete 05/19/2022  Allergen Reaction Noted   Penicillins Anaphylaxis 08/10/2011   Pregabalin Other (See Comments) 07/08/2021   Sulfa antibiotics Anaphylaxis 08/10/2011   Ondansetron  03/12/2015   Other  08/26/2020   Zofran [ondansetron hcl]  02/19/2015    Medications: Current Meds  Medication Sig   hydrochlorothiazide (HYDRODIURIL) 12.5 MG tablet TAKE 1 TABLET(12.5 MG) BY MOUTH DAILY   HYDROcodone-acetaminophen (NORCO) 10-325 MG tablet Take 1 tablet by mouth 5 (five) times daily as  needed.   ibuprofen (ADVIL) 600 MG tablet Take 600 mg by mouth every 6 (six) hours as needed.   methocarbamol (ROBAXIN) 500 MG tablet TAKE 1 TABLET(500 MG) BY MOUTH TWICE DAILY AS NEEDED FOR MUSCLE SPASMS   minoxidil (LONITEN) 2.5 MG tablet Take 1.25 mg by mouth 2 (two) times daily.   nortriptyline (PAMELOR) 10 MG capsule Take 3 capsules (30 mg total) by mouth at bedtime.   Vitamin D, Ergocalciferol, (DRISDOL) 1.25 MG (50000 UNIT) CAPS capsule Take 50,000 Units by mouth once a week.   zolpidem (AMBIEN) 10 MG tablet TAKE 1 TABLET BY MOUTH AT BEDTIME AS NEEDED FOR INSOMNIA    Social History: Social History   Tobacco Use   Smoking status: Former    Packs/day: 1.00    Years: 20.00    Total pack years: 20.00    Types: Cigarettes    Quit date: 01/08/2020    Years since quitting: 2.3   Smokeless tobacco: Never  Vaping Use   Vaping Use: Never used  Substance Use Topics   Alcohol use: Not Currently   Drug use: Never    Family Medical History: Family History  Problem Relation Age of Onset   Diabetes Mother    Hypertension Mother    Pulmonary fibrosis Mother    Cancer Paternal Uncle    Breast cancer Paternal Uncle        49'S   Breast cancer Paternal Aunt        56'S   Other Father        unknown medical history    Physical Examination: Vitals:   05/19/22 1050  BP: 126/82    General: Patient is well developed, well nourished, calm, collected, and in no apparent distress. Attention to examination is appropriate.  Neck:   Supple.  Limited range of motion.  Respiratory: Patient is breathing without any difficulty.   NEUROLOGICAL:     Awake, alert, oriented to person, place, and time.  Speech is clear and fluent. Fund of knowledge is appropriate.   Cranial Nerves: Pupils equal round and reactive to light.  Facial tone is symmetric.  Facial sensation is symmetric. Shoulder shrug is symmetric. Tongue protrusion is midline.  There is no pronator drift.  ROM of spine: full.     Strength: Side Biceps Triceps Deltoid Interossei Grip Wrist Ext. Wrist Flex.  R _0 L 4 4 4- _1 Side Iliopsoas Quads Hamstring PF DF EHL  R _2 L _3 Reflexes are 1+ and symmetric at the biceps, triceps, brachioradialis, patella and achilles.   Hoffman's is absent.  Clonus is not present.  Toes are down-going.  Bilateral upper and lower extremity sensation is intact to light touch.    No evidence of dysmetria noted.  Gait is normal.     Medical Decision  Making  Imaging: MRI C spine 04/29/2021  IMPRESSION:  1. Disc degeneration with reversal of cervical lordosis that has  progressed from a 2011 myelogram.  2. Disc protrusions contact the ventral cord at C5-6 and C6-7.  Diffusely patent spinal canal.  3. Right foraminal impingement at C4-5 and especially C5-6.  4. Moderate left foraminal impingement at C5-6.   Electronically Signed    By: Jorje Guild M.D.    On: 04/30/2021 07:49  C spine xrays 01/31/21 IMPRESSION:  Mid cervical degenerative changes.  Loss of cervical lordosis.   Electronically Signed    By: Nolon Nations M.D.    On: 02/03/2021 13:28  Please note that she has a 23 degree kyphosis between the inferior endplate of C2 and inferior endplate of C7. This is increased from a 15 degree kyphosis from her x-rays in 2017  MRI C spine 05/01/22 IMPRESSION: 1. Multilevel cervical spondylosis as described above, similar to prior study. Unchanged mild-to-moderate spinal canal stenosis and severe bilateral neuroforaminal stenosis at C5-C6. 2. Unchanged mild-to-moderate spinal canal stenosis at C6-C7. 3. Unchanged moderate right neuroforaminal stenosis at C4-C5.     Electronically Signed   By: Titus Dubin M.D.   On: 05/01/2022 16:32    I have personally reviewed the images and agree with the above interpretation.  Assessment and Plan: Ms. Jeng is a pleasant 46 y.o. female with neck pain and cervical kyphosis that  is worsening over time. This is centered between C4 and C7. She also has foraminal disc disease and radicular symptoms.  She has significant weakness objectively.  She has tried and failed conservative management.  We discussed options.  I recommended surgical intervention with a C4-7 anterior cervical discectomy and fusion.  I discussed the planned procedure at length with the patient, including the risks, benefits, alternatives, and indications. The risks discussed include but are not limited to bleeding, infection, need for reoperation, spinal fluid leak, stroke, vision loss, anesthetic complication, coma, paralysis, and even death. We also discussed the possibility of post-operative dysphagia, vocal cord paralysis, and the risk of adjacent segment disease in the future. I also described in detail that improvement was not guaranteed.  The patient expressed understanding of these risks, and asked that we proceed with surgery. I described the surgery in layman's terms, and gave ample opportunity for questions, which were answered to the best of my ability.  I spent a total of 15 minutes in both face-to-face and non-face-to-face activities for this visit on the date of this encounter.  Thank you for involving me in the care of this patient.       Audrey Thull K. Izora Ribas MD, [facility] Neurosurgery

## 2022-05-19 NOTE — Patient Instructions (Signed)
Please see below for information in regards to your upcoming surgery:  Planned surgery: C4-7 anterior cervical discectomy and fusion   Surgery date: 07/08/22 - you will find out your arrival time the business day before your surgery.   NSAIDS (Non-steroidal anti-inflammatory drugs): because you are having a fusion, no NSAIDS (such as ibuprofen, aleve, naproxen, meloxicam, diclofenac) for 3 months after surgery. Celebrex is an exception. Tylenol is ok because it is not an NSAID.   Pre-op appointment at [facility]: we will call you with a date/time for this. Pre-admit testing is located on the first floor of the [facility], [place], [address].   Pre-op labs may be done at your pre-op appointment. You are not required to fast for these labs.    Should you need to change your pre-op appointment, please call Pre-admit testing at [phone].     Because you are having a fusion: for appointments after your 2 week follow-up: please arrive at the [facility] outpatient imaging center ([place], [place]) or [facility] one hour prior to your appointment for x-rays. This applies to every appointment after your 2 week follow-up. Failure to do so may result in your appointment being rescheduled.   If you have FMLA/disability paperwork, please drop it off or fax it to [phone], attention [place].   If you have any questions/concerns before or after surgery, you can reach [place] at [phone], or you can send a mychart message. If you have a concern after hours that cannot wait until normal business hours, you can call [phone] and ask to page the neurosurgeon on call for [place].   Appointments/FMLA & disability paperwork: [place] Nurse: Ophelia Shoulder  Medical assistant: Raquel Sarna Physician Assistant's: Cooper Render & Geronimo Boot Surgeon: Meade Maw, MD

## 2022-05-25 ENCOUNTER — Other Ambulatory Visit: Payer: Self-pay

## 2022-05-25 DIAGNOSIS — Z01818 Encounter for other preprocedural examination: Secondary | ICD-10-CM

## 2022-05-27 ENCOUNTER — Telehealth: Payer: Self-pay | Admitting: Registered Nurse

## 2022-05-27 MED ORDER — HYDROCODONE-ACETAMINOPHEN 10-325 MG PO TABS
1.0000 | ORAL_TABLET | Freq: Every day | ORAL | 0 refills | Status: DC | PRN
Start: 2022-05-27 — End: 2022-06-16

## 2022-05-27 NOTE — Telephone Encounter (Signed)
Call placed to Ms. Hassell Done, and My-Chart message regarding her Hydrocodone. Message left, awaiting a return call.

## 2022-05-27 NOTE — Telephone Encounter (Signed)
PMP was Reviewed,  Hydrocodone e-scribed to pharmacy Elizabeth Torres is aware via My-Chart message

## 2022-06-15 ENCOUNTER — Encounter: Payer: Self-pay | Admitting: Family Medicine

## 2022-06-16 ENCOUNTER — Other Ambulatory Visit: Payer: Self-pay

## 2022-06-16 ENCOUNTER — Encounter: Payer: Self-pay | Admitting: Family Medicine

## 2022-06-16 ENCOUNTER — Encounter: Payer: BC Managed Care – PPO | Attending: Physical Medicine and Rehabilitation | Admitting: Registered Nurse

## 2022-06-16 ENCOUNTER — Encounter: Payer: Self-pay | Admitting: Registered Nurse

## 2022-06-16 VITALS — BP 116/80 | HR 94 | Ht 63.0 in | Wt 157.0 lb

## 2022-06-16 DIAGNOSIS — Z5181 Encounter for therapeutic drug level monitoring: Secondary | ICD-10-CM | POA: Insufficient documentation

## 2022-06-16 DIAGNOSIS — G894 Chronic pain syndrome: Secondary | ICD-10-CM | POA: Insufficient documentation

## 2022-06-16 DIAGNOSIS — M542 Cervicalgia: Secondary | ICD-10-CM | POA: Diagnosis not present

## 2022-06-16 DIAGNOSIS — M5412 Radiculopathy, cervical region: Secondary | ICD-10-CM | POA: Insufficient documentation

## 2022-06-16 DIAGNOSIS — Z79891 Long term (current) use of opiate analgesic: Secondary | ICD-10-CM | POA: Diagnosis not present

## 2022-06-16 DIAGNOSIS — G620 Drug-induced polyneuropathy: Secondary | ICD-10-CM | POA: Insufficient documentation

## 2022-06-16 DIAGNOSIS — M5416 Radiculopathy, lumbar region: Secondary | ICD-10-CM | POA: Insufficient documentation

## 2022-06-16 DIAGNOSIS — M961 Postlaminectomy syndrome, not elsewhere classified: Secondary | ICD-10-CM | POA: Insufficient documentation

## 2022-06-16 DIAGNOSIS — G4700 Insomnia, unspecified: Secondary | ICD-10-CM | POA: Diagnosis not present

## 2022-06-16 DIAGNOSIS — T451X5A Adverse effect of antineoplastic and immunosuppressive drugs, initial encounter: Secondary | ICD-10-CM | POA: Diagnosis not present

## 2022-06-16 DIAGNOSIS — J984 Other disorders of lung: Secondary | ICD-10-CM

## 2022-06-16 MED ORDER — HYDROCODONE-ACETAMINOPHEN 10-325 MG PO TABS: 1.0000 | ORAL_TABLET | Freq: Every day | ORAL | 0 refills | Status: DC | PRN

## 2022-06-16 NOTE — Progress Notes (Signed)
Placed ref to pulm per pt

## 2022-06-16 NOTE — Progress Notes (Unsigned)
Subjective:    Patient ID: Elizabeth Torres, female    DOB: 11-15-1975, 46 y.o.   MRN: [id number]  HPI: Elizabeth Torres is a 46 y.o. female who returns for follow up appointment for chronic pain and medication refill. states *** pain is located in  ***. rates pain ***. current exercise regime is walking and performing stretching exercises.  Ms. Flett Morphine equivalent is *** MME.   UDS ordered today.      Pain Inventory Average Pain 5 Pain Right Now 5 My pain is constant, sharp, and aching  In the last 24 hours, has pain interfered with the following? General activity 5 Relation with others 5 Enjoyment of life 5 What TIME of day is your pain at its worst? morning , daytime, evening, and night Sleep (in general) Poor  Pain is worse with: some activites Pain improves with: rest and medication Relief from Meds: 4  Family History  Problem Relation Age of Onset   Diabetes Mother    Hypertension Mother    Pulmonary fibrosis Mother    Cancer Paternal Uncle    Breast cancer Paternal Uncle        59'S   Breast cancer Paternal Aunt        12'S   Other Father        unknown medical history   Social History   Socioeconomic History   Marital status: Married    Spouse name: Merary Garguilo   Number of children: 3   Years of education: Not on file   Highest education level: Not on file  Occupational History   Not on file  Tobacco Use   Smoking status: Former    Packs/day: 1.00    Years: 20.00    Total pack years: 20.00    Types: Cigarettes    Quit date: 01/08/2020    Years since quitting: 2.4   Smokeless tobacco: Never  Vaping Use   Vaping Use: Never used  Substance and Sexual Activity   Alcohol use: Not Currently   Drug use: Never   Sexual activity: Yes    Partners: Male  Other Topics Concern   Not on file  Social History Narrative   Not on file   Social Determinants of Health   Financial Resource Strain: Not on file  Food Insecurity: Not on file  Transportation  Needs: Not on file  Physical Activity: Not on file  Stress: Not on file  Social Connections: Not on file   Past Surgical History:  Procedure Laterality Date   BREAST BIOPSY Right 07/2011   invasive ductal carcinoma   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 RAd and chemo tx   BREAST SURGERY     mastectomy Right    partial with lymph node dissection   PORT A CATH REVISION     PORT-A-CATH REMOVAL  11-07-15   Dr Bary Castilla   SHOULDER ARTHROSCOPY WITH ROTATOR CUFF REPAIR Left 05/18/2019   Procedure: SHOULDER ARTHROSCOPY WITH SUBACROMIAL DECOMPRESSION AND DISTAL CLAVICAL EXCISION, INTRAARTICULAR DEBRIDEMENT;  Surgeon: Lovell Sheehan, MD;  Location: [facility] [place];  Service: Orthopedics;  Laterality: Left;   SPINE SURGERY  2008   Dr Joya Salm   TUBAL LIGATION     Past Surgical History:  Procedure Laterality Date   BREAST BIOPSY Right 07/2011   invasive ductal carcinoma   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 RAd and chemo tx   BREAST  SURGERY     mastectomy Right    partial with lymph node dissection   PORT A CATH REVISION     PORT-A-CATH REMOVAL  11-07-15   Dr Bary Castilla   SHOULDER ARTHROSCOPY WITH ROTATOR CUFF REPAIR Left 05/18/2019   Procedure: SHOULDER ARTHROSCOPY WITH SUBACROMIAL DECOMPRESSION AND DISTAL CLAVICAL EXCISION, INTRAARTICULAR DEBRIDEMENT;  Surgeon: Lovell Sheehan, MD;  Location: [place] [facility];  Service: Orthopedics;  Laterality: Left;   SPINE SURGERY  2008   Dr Joya Salm   TUBAL LIGATION     Past Medical History:  Diagnosis Date   BRCA negative    Breast cancer ([place]) 2013   RT LUMPECTOMY with chemo and rad tx   Degenerative disc disease, lumbar    Personal history of chemotherapy 2013   for breast ca   Personal history of radiation therapy 2013   F/U right breast cancer    Radiation 2013   BREAST CA   Status post chemotherapy 2013   BREAST CA   BP 116/80   Pulse  94   Ht _0  (1.6 m)   Wt 157 lb (71.2 kg)   SpO2 95%   BMI 27.81 kg/m   Opioid Risk Score:   Fall Risk Score:  `1  Depression screen [facility] 2/9     06/16/2022   10:32 AM 05/06/2022    9:53 AM 03/26/2022    8:06 AM 03/17/2022   10:02 AM 02/10/2022    9:54 AM 01/20/2022   10:06 AM 01/13/2022   11:01 AM  Depression screen PHQ 2/9  Decreased Interest 0 0 0 0 0 0 0  Down, Depressed, Hopeless 0 0 0 0 0 0 0  PHQ - 2 Score 0 0 0 0 0 0 0  Altered sleeping      2   Tired, decreased energy      1   Change in appetite      0   Feeling bad or failure about yourself       0   Trouble concentrating      0   Moving slowly or fidgety/restless      0   Suicidal thoughts      0   PHQ-9 Score      3   Difficult doing work/chores      Not difficult at all     Review of Systems  Musculoskeletal:  Positive for arthralgias, back pain and neck pain.       Pain all over  All other systems reviewed and are negative.      Objective:   Physical Exam        Assessment & Plan:

## 2022-06-18 ENCOUNTER — Other Ambulatory Visit: Payer: Self-pay | Admitting: Registered Nurse

## 2022-06-22 DIAGNOSIS — L648 Other androgenic alopecia: Secondary | ICD-10-CM | POA: Diagnosis not present

## 2022-06-23 ENCOUNTER — Other Ambulatory Visit: Payer: Self-pay | Admitting: *Deleted

## 2022-06-23 ENCOUNTER — Telehealth: Payer: Self-pay | Admitting: Registered Nurse

## 2022-06-23 ENCOUNTER — Telehealth: Payer: Self-pay

## 2022-06-23 MED ORDER — NORTRIPTYLINE HCL 10 MG PO CAPS: ORAL_CAPSULE | ORAL | 1 refills | Status: DC

## 2022-06-23 MED ORDER — ZOLPIDEM TARTRATE 10 MG PO TABS: 10.0000 mg | ORAL_TABLET | Freq: Every evening | ORAL | 2 refills | Status: DC | PRN

## 2022-06-23 NOTE — Telephone Encounter (Signed)
PMP was Reviewed.  Ambien e- scribed today. Elizabeth Torres is aware via My-Chart.

## 2022-06-23 NOTE — Telephone Encounter (Signed)
Patient called in regarding refills per pharmacy Ambien is ready for pickup, Hydrocodone will be ready 06/24/22 and nortriptyline not available until 1.23.24, pt said existing Rx is at [facility] and she will pick up there.

## 2022-06-29 ENCOUNTER — Encounter
Admission: RE | Admit: 2022-06-29 | Discharge: 2022-06-29 | Disposition: A | Discharge status: Home / Self Care | Payer: BC Managed Care – PPO | Source: Ambulatory Visit | Attending: Neurosurgery | Admitting: Neurosurgery

## 2022-06-29 ENCOUNTER — Other Ambulatory Visit: Payer: Self-pay

## 2022-06-29 VITALS — BP 128/87 | HR 95 | Resp 16 | Wt 158.5 lb

## 2022-06-29 DIAGNOSIS — Z01812 Encounter for preprocedural laboratory examination: Secondary | ICD-10-CM | POA: Diagnosis not present

## 2022-06-29 DIAGNOSIS — Z22322 Carrier or suspected carrier of Methicillin resistant Staphylococcus aureus: Secondary | ICD-10-CM

## 2022-06-29 DIAGNOSIS — G894 Chronic pain syndrome: Secondary | ICD-10-CM | POA: Diagnosis not present

## 2022-06-29 DIAGNOSIS — Z79891 Long term (current) use of opiate analgesic: Secondary | ICD-10-CM | POA: Diagnosis not present

## 2022-06-29 DIAGNOSIS — Z5181 Encounter for therapeutic drug level monitoring: Secondary | ICD-10-CM | POA: Diagnosis not present

## 2022-06-29 HISTORY — DX: Essential (primary) hypertension: I10

## 2022-06-29 HISTORY — DX: Carrier or suspected carrier of methicillin resistant Staphylococcus aureus: Z22.322

## 2022-06-29 HISTORY — DX: Pneumonia, unspecified organism: J18.9

## 2022-06-29 LAB — CBC
HCT: 41.5 % (ref 36.0–46.0)
Hemoglobin: 14.3 g/dL (ref 12.0–15.0)
MCH: 31.5 pg (ref 26.0–34.0)
MCHC: 34.5 g/dL (ref 30.0–36.0)
MCV: 91.4 fL (ref 80.0–100.0)
Platelets: 270 10*3/uL (ref 150–400)
RBC: 4.54 MIL/uL (ref 3.87–5.11)
RDW: 14 % (ref 11.5–15.5)
WBC: 7.3 10*3/uL (ref 4.0–10.5)
nRBC: 0 % (ref 0.0–0.2)

## 2022-06-29 LAB — BASIC METABOLIC PANEL
Anion gap: 7 (ref 5–15)
BUN: 11 mg/dL (ref 6–20)
CO2: 29 mmol/L (ref 22–32)
Calcium: 9.5 mg/dL (ref 8.9–10.3)
Chloride: 100 mmol/L (ref 98–111)
Creatinine, Ser: 0.65 mg/dL (ref 0.44–1.00)
GFR, Estimated: >60 mL/min (ref >60)
Glucose, Bld: 93 mg/dL (ref 70–99)
Potassium: 4.3 mmol/L (ref 3.5–5.1)
Sodium: 136 mmol/L (ref 135–145)

## 2022-06-29 LAB — TYPE AND SCREEN
ABO/RH(D): A POS
Antibody Screen: NEGATIVE

## 2022-06-29 LAB — SURGICAL PCR SCREEN
MRSA, PCR: POSITIVE — AB
Staphylococcus aureus: POSITIVE — AB

## 2022-06-29 NOTE — Patient Instructions (Addendum)
Your procedure is scheduled on:07/08/22 - Wednesday Report to the Registration Desk on the 1st floor of the [facility]. To find out your arrival time, please call [phone] between 1PM - 3PM on: 07/07/22 - Tuesday If your arrival time is 6:00 am, do not arrive prior to that time as the [facility] entrance doors do not open until 6:00 am.  REMEMBER: Instructions that are not followed completely may result in serious medical risk, up to and including death; or upon the discretion of your surgeon and anesthesiologist your surgery may need to be rescheduled.  Do not eat food after midnight the night before surgery.  No gum chewing, lozengers or hard candies.  You may however, drink CLEAR liquids up to 2 hours before you are scheduled to arrive for your surgery. Do not drink anything within 2 hours of your scheduled arrival time.  Clear liquids include: - water  - apple juice without pulp - gatorade (not RED colors) - black coffee or tea (Do NOT add milk or creamers to the coffee or tea) Do NOT drink anything that is not on this list.   TAKE THESE MEDICATIONS THE MORNING OF SURGERY WITH A SIP OF WATER:  - minoxidil (LONITEN)  - HYDROcodone-acetaminophen ([place])   One week prior to surgery: Stop Anti-inflammatories (NSAIDS) such as Advil, Aleve, Ibuprofen, Motrin, Naproxen, Naprosyn and Aspirin based products such as Excedrin, Goodys Powder, BC Powder.  Stop ANY OVER THE COUNTER supplements until after surgery.  You may however, continue to take Tylenol if needed for pain up until the day of surgery.  No Alcohol for 24 hours before or after surgery.  No Smoking including e-cigarettes for 24 hours prior to surgery.  No chewable tobacco products for at least 6 hours prior to surgery.  No nicotine patches on the day of surgery.  Do not use any "recreational" drugs for at least a week prior to your surgery.  Please be advised that the combination of cocaine and anesthesia may  have negative outcomes, up to and including death. If you test positive for cocaine, your surgery will be cancelled.  On the morning of surgery brush your teeth with toothpaste and water, you may rinse your mouth with mouthwash if you wish. Do not swallow any toothpaste or mouthwash.  Use CHG Soap or wipes as directed on instruction sheet.  Do not wear jewelry, make-up, hairpins, clips or nail polish.  Do not wear lotions, powders, or perfumes.   Do not shave body from the neck down 48 hours prior to surgery just in case you cut yourself which could leave a site for infection. Also, freshly shaved skin may become irritated if using the CHG soap.  Contact lenses, hearing aids and dentures may not be worn into surgery.  Do not bring valuables to the hospital. [facility] is not responsible for any missing/lost belongings or valuables.   Notify your doctor if there is any change in your medical condition (cold, fever, infection).  Wear comfortable clothing (specific to your surgery type) to the hospital.  After surgery, you can help prevent lung complications by doing breathing exercises.  Take deep breaths and cough every 1-2 hours. Your doctor may order a device called an Incentive Spirometer to help you take deep breaths. When coughing or sneezing, hold a pillow firmly against your incision with both hands. This is called "splinting." Doing this helps protect your incision. It also decreases belly discomfort.  If you are being admitted to the hospital  overnight, leave your suitcase in the car. After surgery it may be brought to your room.  If you are being discharged the day of surgery, you will not be allowed to drive home. You will need a responsible adult (18 years or older) to drive you home and stay with you that night.   If you are taking public transportation, you will need to have a responsible adult (18 years or older) with you. Please confirm with your physician that it is  acceptable to use public transportation.   Please call the [place] Dept. at [phone] if you have any questions about these instructions.  Surgery Visitation Policy:  Patients undergoing a surgery or procedure may have two family members or support persons with them as long as the person is not COVID-19 positive or experiencing its symptoms.   Inpatient Visitation:    Visiting hours are 7 a.m. to 8 p.m. Up to four visitors are allowed at one time in a patient room. The visitors may rotate out with other people during the day. One designated support person (adult) may remain overnight.  MASKING: Due to an increase in RSV rates and hospitalizations, starting Wednesday, Nov. 15, in patient care areas in which we serve newborns, infants and children, masks will be required for teammates and visitors.  Children ages 83 and under may not visit. This policy affects the following departments only:  [place] Postpartum area Mother Baby Unit Newborn nursery/Special care nursery  Other areas: Masks continue to be strongly recommended for [place] teammates, visitors and patients in all other areas. Visitation is not restricted outside of the units listed above.

## 2022-06-29 NOTE — Progress Notes (Signed)
  Perioperative Services  Abnormal Lab Notification and Treatment Plan of Care  Date: 06/29/22  Name: Elizabeth Torres MRN:   [id number]  Re: Abnormal labs noted during PAT appointment  Provider Notified: Meade Maw, MD Notification mode: Routed and/or faxed via [place] of concern: Lab Results  Component Value Date   STAPHAUREUS POSITIVE (A) 06/29/2022   MRSAPCR POSITIVE (A) 06/29/2022    Notes: Patient is scheduled for a C4-7 ANTERIOR CERVICAL DISCECTOMY AND FUSION (GLOBUS HEDRON) on 07/08/2022. She is scheduled to receive CEFAZOLIN pre-operatively. Surgical PCR (+) for MRSA; see above.  PLANS:  Review renal function. Estimated Creatinine Clearance: 84.4 mL/min (by C-G formula based on SCr of 0.65 mg/dL). Review allergies. No documented allergy to vancomycin. Order added for VANCOMYCIN 1 GRAM IV to current preoperative prophylactic regimen.  Patient with orders for both CEFAZOLIN + VANCOMYCIN to be given in the setting of documented MRSA (+) surgical PCR.   Guidelines suggest that a beta-lactam antibiotic (first or second generation cephalosporin) should be added for activity against gram-negative organisms.  Vancomycin appears to be less effective than cefazolin for preventing SSIs caused by MSSA. For this reason, the use of vancomycin in combination with cefazolin is favored for prevention of SSI due to MRSA and coagulase-negative staphylococci.  Notify primary attending surgeon of: (+) MRSA result Order has been placed for the preoperative Vancomycin dose.   Honor Loh, MSN, APRN, FNP-C, C[facility]  Peri-operative Services Nurse Practitioner Phone: [phone] 06/29/22 3:37 PM

## 2022-07-02 LAB — TOXASSURE SELECT,+ANTIDEPR,UR

## 2022-07-07 MED ORDER — ORAL CARE MOUTH RINSE: 15.0000 mL | Freq: Once | OROMUCOSAL | Status: AC

## 2022-07-07 MED ORDER — CEFAZOLIN IN SODIUM CHLORIDE 2-0.9 GM/100ML-% IV SOLN
2.0000 g | Freq: Once | INTRAVENOUS | Status: DC
  Filled 2022-07-07: qty 100

## 2022-07-07 MED ORDER — VANCOMYCIN HCL IN DEXTROSE 1-5 GM/200ML-% IV SOLN
1000.0000 mg | Freq: Once | INTRAVENOUS | Status: AC
  Administered 2022-07-08: 1000 mg via INTRAVENOUS

## 2022-07-07 MED ORDER — LACTATED RINGERS IV SOLN: INTRAVENOUS | Status: DC

## 2022-07-07 MED ORDER — CHLORHEXIDINE GLUCONATE 0.12 % MT SOLN: 15.0000 mL | Freq: Once | OROMUCOSAL | Status: AC

## 2022-07-07 MED ORDER — FAMOTIDINE 20 MG PO TABS: 20.0000 mg | ORAL_TABLET | Freq: Once | ORAL | Status: AC

## 2022-07-08 ENCOUNTER — Other Ambulatory Visit: Payer: Self-pay

## 2022-07-08 ENCOUNTER — Encounter
Admission: RE | Disposition: A | Discharge status: Home / Self Care | Payer: Self-pay | Source: Home / Self Care | Attending: Neurosurgery

## 2022-07-08 ENCOUNTER — Inpatient Hospital Stay: Payer: BC Managed Care – PPO | Admitting: Registered Nurse

## 2022-07-08 ENCOUNTER — Inpatient Hospital Stay
Admission: RE | Admit: 2022-07-08 | Discharge: 2022-07-09 | DRG: 473 | Disposition: A | Discharge status: Home / Self Care | Payer: BC Managed Care – PPO | Attending: Neurosurgery | Admitting: Neurosurgery

## 2022-07-08 ENCOUNTER — Inpatient Hospital Stay: Payer: BC Managed Care – PPO

## 2022-07-08 ENCOUNTER — Inpatient Hospital Stay: Payer: BC Managed Care – PPO | Admitting: Urgent Care

## 2022-07-08 ENCOUNTER — Encounter: Payer: Self-pay | Admitting: Neurosurgery

## 2022-07-08 DIAGNOSIS — Z880 Allergy status to penicillin: Secondary | ICD-10-CM

## 2022-07-08 DIAGNOSIS — Z9011 Acquired absence of right breast and nipple: Secondary | ICD-10-CM | POA: Diagnosis not present

## 2022-07-08 DIAGNOSIS — M4722 Other spondylosis with radiculopathy, cervical region: Secondary | ICD-10-CM | POA: Diagnosis not present

## 2022-07-08 DIAGNOSIS — Z923 Personal history of irradiation: Secondary | ICD-10-CM | POA: Diagnosis not present

## 2022-07-08 DIAGNOSIS — M4802 Spinal stenosis, cervical region: Secondary | ICD-10-CM | POA: Diagnosis not present

## 2022-07-08 DIAGNOSIS — F17210 Nicotine dependence, cigarettes, uncomplicated: Secondary | ICD-10-CM | POA: Diagnosis present

## 2022-07-08 DIAGNOSIS — M50122 Cervical disc disorder at C5-C6 level with radiculopathy: Secondary | ICD-10-CM | POA: Diagnosis not present

## 2022-07-08 DIAGNOSIS — I10 Essential (primary) hypertension: Secondary | ICD-10-CM | POA: Diagnosis present

## 2022-07-08 DIAGNOSIS — Z981 Arthrodesis status: Secondary | ICD-10-CM | POA: Diagnosis not present

## 2022-07-08 DIAGNOSIS — Z888 Allergy status to other drugs, medicaments and biological substances status: Secondary | ICD-10-CM

## 2022-07-08 DIAGNOSIS — Z8249 Family history of ischemic heart disease and other diseases of the circulatory system: Secondary | ICD-10-CM

## 2022-07-08 DIAGNOSIS — Z79899 Other long term (current) drug therapy: Secondary | ICD-10-CM

## 2022-07-08 DIAGNOSIS — Z9221 Personal history of antineoplastic chemotherapy: Secondary | ICD-10-CM | POA: Diagnosis not present

## 2022-07-08 DIAGNOSIS — M4322 Fusion of spine, cervical region: Secondary | ICD-10-CM | POA: Diagnosis not present

## 2022-07-08 DIAGNOSIS — Z882 Allergy status to sulfonamides status: Secondary | ICD-10-CM

## 2022-07-08 DIAGNOSIS — M40202 Unspecified kyphosis, cervical region: Secondary | ICD-10-CM | POA: Diagnosis present

## 2022-07-08 DIAGNOSIS — M4012 Other secondary kyphosis, cervical region: Secondary | ICD-10-CM

## 2022-07-08 DIAGNOSIS — Z853 Personal history of malignant neoplasm of breast: Secondary | ICD-10-CM

## 2022-07-08 DIAGNOSIS — Z803 Family history of malignant neoplasm of breast: Secondary | ICD-10-CM | POA: Diagnosis not present

## 2022-07-08 DIAGNOSIS — R29898 Other symptoms and signs involving the musculoskeletal system: Secondary | ICD-10-CM

## 2022-07-08 DIAGNOSIS — M5412 Radiculopathy, cervical region: Secondary | ICD-10-CM

## 2022-07-08 DIAGNOSIS — Z01818 Encounter for other preprocedural examination: Secondary | ICD-10-CM

## 2022-07-08 HISTORY — PX: ANTERIOR CERVICAL DECOMP/DISCECTOMY FUSION: SHX1161

## 2022-07-08 LAB — ABO/RH: ABO/RH(D): A POS

## 2022-07-08 LAB — POCT PREGNANCY, URINE: Preg Test, Ur: NEGATIVE

## 2022-07-08 SURGERY — ANTERIOR CERVICAL DECOMPRESSION/DISCECTOMY FUSION 3 LEVELS
Anesthesia: General | Site: Spine Cervical

## 2022-07-08 MED ORDER — SURGIFLO WITH THROMBIN (HEMOSTATIC MATRIX KIT) OPTIME
TOPICAL | Status: DC | PRN
  Administered 2022-07-08: 1 via TOPICAL

## 2022-07-08 MED ORDER — ACETAMINOPHEN 500 MG PO TABS
1000.0000 mg | ORAL_TABLET | Freq: Four times a day (QID) | ORAL | Status: AC
  Administered 2022-07-08 – 2022-07-09 (×2): 1000 mg via ORAL

## 2022-07-08 MED ORDER — ZOLPIDEM TARTRATE 5 MG PO TABS: 5.0000 mg | ORAL_TABLET | Freq: Every evening | ORAL | Status: DC | PRN

## 2022-07-08 MED ORDER — KETOROLAC TROMETHAMINE 30 MG/ML IJ SOLN
INTRAMUSCULAR | Status: AC
  Filled 2022-07-08: qty 1

## 2022-07-08 MED ORDER — SODIUM CHLORIDE 0.9% FLUSH: 3.0000 mL | Freq: Two times a day (BID) | INTRAVENOUS | Status: DC

## 2022-07-08 MED ORDER — MINOXIDIL 2.5 MG PO TABS
1.2500 mg | ORAL_TABLET | Freq: Two times a day (BID) | ORAL | Status: DC
  Administered 2022-07-08: 1.25 mg via ORAL
  Filled 2022-07-08 (×2): qty 0.5

## 2022-07-08 MED ORDER — METHOCARBAMOL 500 MG PO TABS
500.0000 mg | ORAL_TABLET | Freq: Four times a day (QID) | ORAL | Status: DC | PRN
  Administered 2022-07-09: 500 mg via ORAL

## 2022-07-08 MED ORDER — OXYCODONE HCL 5 MG PO TABS
ORAL_TABLET | ORAL | Status: AC
  Filled 2022-07-08: qty 2

## 2022-07-08 MED ORDER — PROPOFOL 500 MG/50ML IV EMUL
INTRAVENOUS | Status: DC | PRN
  Administered 2022-07-08: 150 ug/kg/min via INTRAVENOUS

## 2022-07-08 MED ORDER — DIPHENHYDRAMINE HCL 50 MG/ML IJ SOLN
INTRAMUSCULAR | Status: DC | PRN
  Administered 2022-07-08: 12.5 mg via INTRAVENOUS

## 2022-07-08 MED ORDER — MIDAZOLAM HCL 2 MG/2ML IJ SOLN
INTRAMUSCULAR | Status: AC
  Filled 2022-07-08: qty 2

## 2022-07-08 MED ORDER — SODIUM CHLORIDE 0.9% FLUSH: 3.0000 mL | INTRAVENOUS | Status: DC | PRN

## 2022-07-08 MED ORDER — KETOROLAC TROMETHAMINE 15 MG/ML IJ SOLN
INTRAMUSCULAR | Status: DC | PRN
  Administered 2022-07-08: 15 mg via INTRAVENOUS

## 2022-07-08 MED ORDER — SODIUM CHLORIDE 0.9 % IV SOLN: INTRAVENOUS | Status: DC

## 2022-07-08 MED ORDER — PHENYLEPHRINE HCL-NACL 20-0.9 MG/250ML-% IV SOLN
INTRAVENOUS | Status: DC | PRN
  Administered 2022-07-08: 40 ug/min via INTRAVENOUS

## 2022-07-08 MED ORDER — SODIUM CHLORIDE 0.9 % IV SOLN
1.0000 g | INTRAVENOUS | Status: AC
  Administered 2022-07-08: 1 g via INTRAVENOUS
  Filled 2022-07-08: qty 5
  Filled 2022-07-08: qty 1

## 2022-07-08 MED ORDER — OXYCODONE HCL 5 MG PO TABS
ORAL_TABLET | ORAL | Status: AC
  Filled 2022-07-08: qty 1

## 2022-07-08 MED ORDER — BISACODYL 10 MG RE SUPP: 10.0000 mg | Freq: Every day | RECTAL | Status: DC | PRN

## 2022-07-08 MED ORDER — SODIUM CHLORIDE (PF) 0.9 % IJ SOLN
INTRAMUSCULAR | Status: AC
  Filled 2022-07-08: qty 20

## 2022-07-08 MED ORDER — ACETAMINOPHEN 10 MG/ML IV SOLN
INTRAVENOUS | Status: AC
  Filled 2022-07-08: qty 100

## 2022-07-08 MED ORDER — VANCOMYCIN HCL IN DEXTROSE 1-5 GM/200ML-% IV SOLN
INTRAVENOUS | Status: AC
  Filled 2022-07-08: qty 200

## 2022-07-08 MED ORDER — SODIUM CHLORIDE 0.9 % IV SOLN
12.5000 mg | Freq: Four times a day (QID) | INTRAVENOUS | Status: DC | PRN
  Filled 2022-07-08: qty 0.5

## 2022-07-08 MED ORDER — FENTANYL CITRATE (PF) 100 MCG/2ML IJ SOLN
25.0000 ug | INTRAMUSCULAR | Status: DC | PRN
  Administered 2022-07-08: 50 ug via INTRAVENOUS

## 2022-07-08 MED ORDER — OXYCODONE HCL 5 MG PO TABS
5.0000 mg | ORAL_TABLET | ORAL | Status: DC | PRN
  Administered 2022-07-08: 5 mg via ORAL

## 2022-07-08 MED ORDER — MENTHOL 3 MG MT LOZG: 1.0000 | LOZENGE | OROMUCOSAL | Status: DC | PRN

## 2022-07-08 MED ORDER — 0.9 % SODIUM CHLORIDE (POUR BTL) OPTIME
TOPICAL | Status: DC | PRN
  Administered 2022-07-08: 500 mL

## 2022-07-08 MED ORDER — PROPOFOL 10 MG/ML IV BOLUS
INTRAVENOUS | Status: DC | PRN
  Administered 2022-07-08: 150 mg via INTRAVENOUS

## 2022-07-08 MED ORDER — FENTANYL CITRATE (PF) 100 MCG/2ML IJ SOLN
INTRAMUSCULAR | Status: AC
  Administered 2022-07-08: 50 ug via INTRAVENOUS
  Filled 2022-07-08: qty 2

## 2022-07-08 MED ORDER — KETOROLAC TROMETHAMINE 15 MG/ML IJ SOLN
15.0000 mg | Freq: Four times a day (QID) | INTRAMUSCULAR | Status: AC
  Administered 2022-07-08 – 2022-07-09 (×4): 15 mg via INTRAVENOUS

## 2022-07-08 MED ORDER — PHENOL 1.4 % MT LIQD: 1.0000 | OROMUCOSAL | Status: DC | PRN

## 2022-07-08 MED ORDER — OXYCODONE HCL 5 MG PO TABS
10.0000 mg | ORAL_TABLET | ORAL | Status: DC | PRN
  Administered 2022-07-08 – 2022-07-09 (×4): 10 mg via ORAL

## 2022-07-08 MED ORDER — MORPHINE SULFATE (PF) 2 MG/ML IV SOLN
2.0000 mg | INTRAVENOUS | Status: DC | PRN
  Administered 2022-07-08: 2 mg via INTRAVENOUS

## 2022-07-08 MED ORDER — PHENYLEPHRINE HCL-NACL 20-0.9 MG/250ML-% IV SOLN
INTRAVENOUS | Status: AC
  Filled 2022-07-08: qty 250

## 2022-07-08 MED ORDER — FENTANYL CITRATE (PF) 100 MCG/2ML IJ SOLN
INTRAMUSCULAR | Status: AC
  Filled 2022-07-08: qty 2

## 2022-07-08 MED ORDER — FAMOTIDINE 20 MG PO TABS
ORAL_TABLET | ORAL | Status: AC
  Administered 2022-07-08: 20 mg via ORAL
  Filled 2022-07-08: qty 1

## 2022-07-08 MED ORDER — DIPHENHYDRAMINE HCL 50 MG/ML IJ SOLN
INTRAMUSCULAR | Status: AC
  Filled 2022-07-08: qty 1

## 2022-07-08 MED ORDER — ONDANSETRON HCL 4 MG/2ML IJ SOLN
INTRAMUSCULAR | Status: AC
  Filled 2022-07-08: qty 2

## 2022-07-08 MED ORDER — REMIFENTANIL HCL 1 MG IV SOLR
INTRAVENOUS | Status: AC
  Filled 2022-07-08: qty 1000

## 2022-07-08 MED ORDER — ACETAMINOPHEN 500 MG PO TABS
ORAL_TABLET | ORAL | Status: AC
  Filled 2022-07-08: qty 2

## 2022-07-08 MED ORDER — ACETAMINOPHEN 10 MG/ML IV SOLN
INTRAVENOUS | Status: DC | PRN
  Administered 2022-07-08: 1000 mg via INTRAVENOUS

## 2022-07-08 MED ORDER — HYDROMORPHONE HCL 1 MG/ML IJ SOLN
INTRAMUSCULAR | Status: AC
  Administered 2022-07-08: 1 mg via INTRAVENOUS
  Filled 2022-07-08: qty 1

## 2022-07-08 MED ORDER — SODIUM CHLORIDE 0.9 % IV SOLN: 250.0000 mL | INTRAVENOUS | Status: DC

## 2022-07-08 MED ORDER — REMIFENTANIL HCL 1 MG IV SOLR
INTRAVENOUS | Status: DC | PRN
  Administered 2022-07-08: .2 ug/kg/min via INTRAVENOUS

## 2022-07-08 MED ORDER — CEFAZOLIN SODIUM-DEXTROSE 2-4 GM/100ML-% IV SOLN: 2.0000 g | INTRAVENOUS | Status: DC

## 2022-07-08 MED ORDER — BUPIVACAINE-EPINEPHRINE 0.5% -1:200000 IJ SOLN
INTRAMUSCULAR | Status: DC | PRN
  Administered 2022-07-08: 5 mL

## 2022-07-08 MED ORDER — DEXAMETHASONE SODIUM PHOSPHATE 10 MG/ML IJ SOLN
INTRAMUSCULAR | Status: DC | PRN
  Administered 2022-07-08: 10 mg via INTRAVENOUS

## 2022-07-08 MED ORDER — SENNA 8.6 MG PO TABS: 1.0000 | ORAL_TABLET | Freq: Two times a day (BID) | ORAL | Status: DC

## 2022-07-08 MED ORDER — HYDROMORPHONE HCL 1 MG/ML IJ SOLN: 1.0000 mg | INTRAMUSCULAR | Status: AC | PRN

## 2022-07-08 MED ORDER — DEXAMETHASONE SODIUM PHOSPHATE 10 MG/ML IJ SOLN
INTRAMUSCULAR | Status: AC
  Filled 2022-07-08: qty 1

## 2022-07-08 MED ORDER — ENOXAPARIN SODIUM 40 MG/0.4ML IJ SOSY: 40.0000 mg | PREFILLED_SYRINGE | INTRAMUSCULAR | Status: DC

## 2022-07-08 MED ORDER — HYDROCHLOROTHIAZIDE 25 MG PO TABS: 12.5000 mg | ORAL_TABLET | Freq: Every day | ORAL | Status: DC

## 2022-07-08 MED ORDER — VITAMIN D (ERGOCALCIFEROL) 1.25 MG (50000 UNIT) PO CAPS
50000.0000 [IU] | ORAL_CAPSULE | ORAL | Status: DC
  Filled 2022-07-08: qty 1

## 2022-07-08 MED ORDER — OXYCODONE HCL 5 MG PO TABS: 5.0000 mg | ORAL_TABLET | Freq: Once | ORAL | Status: DC | PRN

## 2022-07-08 MED ORDER — GLYCOPYRROLATE 0.2 MG/ML IJ SOLN
INTRAMUSCULAR | Status: AC
  Filled 2022-07-08: qty 1

## 2022-07-08 MED ORDER — MORPHINE SULFATE (PF) 4 MG/ML IV SOLN
INTRAVENOUS | Status: AC
  Filled 2022-07-08: qty 1

## 2022-07-08 MED ORDER — CEFAZOLIN SODIUM-DEXTROSE 2-4 GM/100ML-% IV SOLN
INTRAVENOUS | Status: AC
  Filled 2022-07-08: qty 100

## 2022-07-08 MED ORDER — PROPOFOL 1000 MG/100ML IV EMUL
INTRAVENOUS | Status: AC
  Filled 2022-07-08: qty 100

## 2022-07-08 MED ORDER — SUCCINYLCHOLINE CHLORIDE 200 MG/10ML IV SOSY
PREFILLED_SYRINGE | INTRAVENOUS | Status: DC | PRN
  Administered 2022-07-08: 100 mg via INTRAVENOUS

## 2022-07-08 MED ORDER — POLYETHYLENE GLYCOL 3350 17 G PO PACK: 17.0000 g | PACK | Freq: Every day | ORAL | Status: DC | PRN

## 2022-07-08 MED ORDER — DOCUSATE SODIUM 100 MG PO CAPS
ORAL_CAPSULE | ORAL | Status: AC
  Filled 2022-07-08: qty 1

## 2022-07-08 MED ORDER — MIDAZOLAM HCL 2 MG/2ML IJ SOLN
INTRAMUSCULAR | Status: DC | PRN
  Administered 2022-07-08: 2 mg via INTRAVENOUS

## 2022-07-08 MED ORDER — DOCUSATE SODIUM 100 MG PO CAPS: 100.0000 mg | ORAL_CAPSULE | Freq: Two times a day (BID) | ORAL | Status: DC

## 2022-07-08 MED ORDER — FENTANYL CITRATE (PF) 100 MCG/2ML IJ SOLN
INTRAMUSCULAR | Status: DC | PRN
  Administered 2022-07-08 (×4): 50 ug via INTRAVENOUS

## 2022-07-08 MED ORDER — LIDOCAINE HCL (CARDIAC) PF 100 MG/5ML IV SOSY
PREFILLED_SYRINGE | INTRAVENOUS | Status: DC | PRN
  Administered 2022-07-08: 100 mg via INTRAVENOUS

## 2022-07-08 MED ORDER — LIDOCAINE HCL (PF) 2 % IJ SOLN
INTRAMUSCULAR | Status: AC
  Filled 2022-07-08: qty 5

## 2022-07-08 MED ORDER — NORTRIPTYLINE HCL 10 MG PO CAPS
40.0000 mg | ORAL_CAPSULE | Freq: Every day | ORAL | Status: DC
  Administered 2022-07-08: 40 mg via ORAL
  Filled 2022-07-08: qty 4

## 2022-07-08 MED ORDER — SODIUM CHLORIDE 0.9 % IV SOLN: INTRAVENOUS | Status: DC | PRN

## 2022-07-08 MED ORDER — SENNA 8.6 MG PO TABS
ORAL_TABLET | ORAL | Status: AC
  Administered 2022-07-08: 8.6 mg
  Filled 2022-07-08: qty 1

## 2022-07-08 MED ORDER — MORPHINE SULFATE (PF) 2 MG/ML IV SOLN
INTRAVENOUS | Status: AC
  Administered 2022-07-08: 2 mg via INTRAVENOUS
  Filled 2022-07-08: qty 1

## 2022-07-08 MED ORDER — KETAMINE HCL 10 MG/ML IJ SOLN
INTRAMUSCULAR | Status: DC | PRN
  Administered 2022-07-08: 50 mg via INTRAVENOUS

## 2022-07-08 MED ORDER — KETAMINE HCL 50 MG/5ML IJ SOSY
PREFILLED_SYRINGE | INTRAMUSCULAR | Status: AC
  Filled 2022-07-08: qty 5

## 2022-07-08 MED ORDER — CHLORHEXIDINE GLUCONATE 0.12 % MT SOLN
OROMUCOSAL | Status: AC
  Administered 2022-07-08: 15 mL via OROMUCOSAL
  Filled 2022-07-08: qty 15

## 2022-07-08 MED ORDER — OXYCODONE HCL 5 MG/5ML PO SOLN: 5.0000 mg | Freq: Once | ORAL | Status: DC | PRN

## 2022-07-08 MED ORDER — KETOROLAC TROMETHAMINE 15 MG/ML IJ SOLN
INTRAMUSCULAR | Status: AC
  Filled 2022-07-08: qty 1

## 2022-07-08 MED ORDER — MAGNESIUM CITRATE PO SOLN: 1.0000 | Freq: Once | ORAL | Status: DC | PRN

## 2022-07-08 MED ORDER — SUCCINYLCHOLINE CHLORIDE 200 MG/10ML IV SOSY
PREFILLED_SYRINGE | INTRAVENOUS | Status: AC
  Filled 2022-07-08: qty 10

## 2022-07-08 MED ORDER — BUPIVACAINE-EPINEPHRINE (PF) 0.5% -1:200000 IJ SOLN
INTRAMUSCULAR | Status: AC
  Filled 2022-07-08: qty 30

## 2022-07-08 MED ORDER — METHOCARBAMOL 1000 MG/10ML IJ SOLN
500.0000 mg | Freq: Four times a day (QID) | INTRAVENOUS | Status: DC | PRN
  Administered 2022-07-08: 500 mg via INTRAVENOUS
  Filled 2022-07-08 (×2): qty 5

## 2022-07-08 MED ORDER — GLYCOPYRROLATE 0.2 MG/ML IJ SOLN
INTRAMUSCULAR | Status: DC | PRN
  Administered 2022-07-08: .2 mg via INTRAVENOUS

## 2022-07-08 MED ORDER — REMIFENTANIL HCL 1 MG IV SOLR: INTRAVENOUS | Status: DC | PRN

## 2022-07-08 SURGICAL SUPPLY — 51 items
ADH SKN CLS APL DERMABOND .7 (GAUZE/BANDAGES/DRESSINGS) ×1
AGENT HMST KT MTR STRL THRMB (HEMOSTASIS) ×1
BASIN KIT SINGLE STR (MISCELLANEOUS) ×1 IMPLANT
BULB RESERV EVAC DRAIN JP 100C (MISCELLANEOUS) IMPLANT
BUR NEURO DRILL SOFT 3.0X3.8M (BURR) ×1 IMPLANT
DERMABOND ADVANCED .7 DNX12 (GAUZE/BANDAGES/DRESSINGS) ×1 IMPLANT
DRAIN CHANNEL JP 10F RND 20C F (MISCELLANEOUS) IMPLANT
DRAPE C ARM PK CFD 31 SPINE (DRAPES) ×1 IMPLANT
DRAPE LAPAROTOMY 77X122 PED (DRAPES) ×1 IMPLANT
DRAPE MICROSCOPE SPINE 48X150 (DRAPES) ×1 IMPLANT
DRAPE SURG 17X11 SM STRL (DRAPES) ×1 IMPLANT
FEE INTRAOP CADWELL SUPPLY NCS (MISCELLANEOUS) IMPLANT
FEE INTRAOP MONITOR IMPULS NCS (MISCELLANEOUS) IMPLANT
GLOVE BIOGEL PI IND STRL 6.5 (GLOVE) ×1 IMPLANT
GLOVE BIOGEL PI IND STRL 8.5 (GLOVE) ×1 IMPLANT
GLOVE SURG SYN 6.5 ES PF (GLOVE) ×2 IMPLANT
GLOVE SURG SYN 6.5 PF PI (GLOVE) ×2 IMPLANT
GLOVE SURG SYN 8.5  E (GLOVE) ×3
GLOVE SURG SYN 8.5 E (GLOVE) ×3 IMPLANT
GLOVE SURG SYN 8.5 PF PI (GLOVE) ×3 IMPLANT
GOWN SRG LRG LVL 4 IMPRV REINF (GOWNS) ×2 IMPLANT
GOWN SRG XL LVL 3 NONREINFORCE (GOWNS) ×1 IMPLANT
GOWN STRL NON-REIN TWL XL LVL3 (GOWNS) ×1
GOWN STRL REIN LRG LVL4 (GOWNS) ×2
INTRAOP CADWELL SUPPLY FEE NCS (MISCELLANEOUS)
INTRAOP DISP SUPPLY FEE NCS (MISCELLANEOUS)
INTRAOP MONITOR FEE IMPULS NCS (MISCELLANEOUS)
INTRAOP MONITOR FEE IMPULSE (MISCELLANEOUS)
KIT TURNOVER KIT A (KITS) ×1 IMPLANT
MANIFOLD NEPTUNE II (INSTRUMENTS) ×1 IMPLANT
NS IRRIG 1000ML POUR BTL (IV SOLUTION) ×1 IMPLANT
NS IRRIG 500ML POUR BTL (IV SOLUTION) IMPLANT
PACK LAMINECTOMY NEURO (CUSTOM PROCEDURE TRAY) ×1 IMPLANT
PAD ARMBOARD 7.5X6 YLW CONV (MISCELLANEOUS) ×2 IMPLANT
PIN CASPAR 14 (PIN) ×1 IMPLANT
PIN CASPAR 14MM (PIN) ×1
PLATE ANT CERV XTEND 3 LV 48 (Plate) IMPLANT
PUTTY DBM PROPEL LRG (Putty) IMPLANT
SCREW VAR 4.2 XD SELF DRILL 14 (Screw) IMPLANT
SCREW VAR 4.2 XD SELF DRILL 16 (Screw) IMPLANT
SPACER C HEDRON 12X14 6 7D (Spacer) IMPLANT
SPACER C HEDRON 12X14 7M 7D (Spacer) IMPLANT
SPONGE KITTNER 5P (MISCELLANEOUS) ×2 IMPLANT
STAPLER SKIN PROX 35W (STAPLE) IMPLANT
SURGIFLO W/THROMBIN 8M KIT (HEMOSTASIS) ×1 IMPLANT
SUT V-LOC 90 ABS DVC 3-0 CL (SUTURE) ×1 IMPLANT
SUT VIC AB 3-0 SH 8-18 (SUTURE) ×1 IMPLANT
SYR 20ML LL LF (SYRINGE) ×1 IMPLANT
TAPE CLOTH 3X10 WHT NS LF (GAUZE/BANDAGES/DRESSINGS) ×3 IMPLANT
TRAP FLUID SMOKE EVACUATOR (MISCELLANEOUS) ×1 IMPLANT
TRAY FOLEY MTR SLVR 16FR STAT (SET/KITS/TRAYS/PACK) IMPLANT

## 2022-07-08 NOTE — Op Note (Signed)
Indications: Ms. 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 She failed conservative management prompting surgical intervention.  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   Preoperative Note:   Risks of surgery discussed include: infection, bleeding, stroke, coma, death, paralysis, CSF leak, nerve/spinal cord injury, numbness, tingling, weakness, complex regional pain syndrome, recurrent stenosis and/or disc herniation, vascular injury, development of instability, neck/back pain, need for further surgery, persistent symptoms, development of deformity, and the risks of anesthesia. The patient understood these risks and agreed to proceed.  Operative Note:   Operative Procedure: 1. Anterior Cervical Discectomy C4-5 including bilateral foraminotomies and end plate preparation  2. Anterior Cervical Discectomy C5-6 including bilateral foraminotomies and end plate preparation  3. Anterior Cervical Discectomy C6-7 including bilateral foraminotomies and end plate preparation 4. Anterior Spinal Instrumentation C4 to 7 using Globus Xtend 5. Anterior arthrodesis from C4 to C7 with placement of biomechanical devices at C4-5, C5-6, and C6-7 6. Use of the operative microscope 7. Use of intraoperative flouroscopy  PROCEDURE IN DETAIL: After obtaining informed consent, the patient taken to the operating room, placed in supine position, general anesthesia induced.  The patient had a small shoulder roll placed behind their shoulders.  The patient received preop  antibiotics and IV Decadron.  The patient had a neck incision outlined, was prepped and draped in usual sterile fashion. The incision was injected with local anesthetic.   An incision was opened, dissection taken down medial to the carotid artery and jugular vein, lateral to the trachea and esophagus.  The prevertebral fascia identified and a localizing x-ray demonstrated the correct level.  The longus colli were dissected laterally, and self-retaining retractors placed to open the operative field. The microscope was then brought into the field.  With this complete, distractor pins were placed in the vertebral bodies of C4, C6, and C7. The distractor was placed from C4-6, and the anuli at C4-5 and C5-6 were opened using a bovie.  Curettes and pituitary rongeurs used to remove the majority of disk, then the drill was used to remove the posterior osteophyte and begin the foraminotomies. The nerve hook was used to elevate the posterior longitudinal ligament, which was then removed with Kerrison rongeurs. The microblunt nerve hook could be passed out the foramen bilateral at each level.   Meticulous hemostasis obtained. A biomechanical device (Globus Hedron C 7 mm height x 14 mm width by 12 mm depth) was placed at C4/5. A second biomechanical device (Globus Hedron C 6 mm height x 14 mm width by 12 mm depth) was placed at C5/6. Each device had been filled with allograft for aid in arthrodesis.  The caspar distractor was removed and placed at C6-7. The pin at C4 was removed and bone wax used for hemostasis. The C6-7 disc was opened with the bovie. Curettes and pituitary rongeurs used to remove the majority of disk, then the drill was used to remove the posterior osteophyte and begin the foraminotomies. The nerve hook was used to elevate the posterior longitudinal ligament, which was then removed with Kerrison rongeurs. The microblunt nerve hook could be passed out the foramen bilateral at each  level.   Meticulous  hemostasis obtained.  A biomechanical device (Globus Hedron C 7 mm height x 14 mm width by 12 mm depth) was placed at C6/7.    A four segment, three level plate (48 mm Globus Xtend) was chosen.  Two screws placed in the vertebral bodies of all four segments, respectively making sure the screws were behind the locking mechanism.  Final AP and lateral radiographs were taken.   Please note that the plate is not inclusive to the biomechanical devices.  The anchoring mechanism of the plate is completely separate from the biomechanical devices.  With everything in good position, the wound was irrigated copiously with bacitracin-containing solution and meticulous hemostasis obtained.  Wound was closed in 2 layers using interrupted inverted 3-0 Vicryl sutures.  The wound was dressed with dermabond, the head of bed at 30 degrees, taken to recovery room in stable condition.  No new postop neurological deficits were identified..  All counts were correct at the end of the case.  I performed the entire procedure with the assistance of Cooper Render PA as an Pensions consultant. An assistant was required for this procedure due to the complexity.  The assistant provided assistance in tissue manipulation and suction, and was required for the successful and safe performance of the procedure. I performed the critical portions of the procedure.   Meade Maw MD

## 2022-07-08 NOTE — Progress Notes (Signed)
Patient awake/alert x4.  Neck area with dermabond c/d/I, ice pack behind neck for comfort. Moving all ext, pre-op upper left arm weakness. Patient states "not sure if different" Reviewed procedure with patient, encouraged her to move her neck, states pain with  movement. Offered encouragement. Moving bil lower ext without event. Speech clear, soft spoken. Tolerated water without event, family updated.

## 2022-07-08 NOTE — Discharge Instructions (Signed)
Your surgeon has performed an operation on your cervical spine (neck) to relieve pressure on the spinal cord and/or nerves. This involved making an incision in the front of your neck and removing one or more of the discs that support your spine. Next, a small piece of bone, a titanium plate, and screws were used to fuse two or more of the vertebrae (bones) together.  The following are instructions to help in your recovery once you have been discharged from the hospital. Even if you feel well, it is important that you follow these activity guidelines. If you do not let your neck heal properly from the surgery, you can increase the chance of return of your symptoms and other complications.  * Do not take anti-inflammatory medications for 3 months after surgery (naproxen [Aleve], ibuprofen [Advil, Motrin], etc.). These medications can prevent your bones from healing properly.  Celebrex, if prescribed, is ok to take.  Activity    No bending, lifting, or twisting ("BLT"). Avoid lifting objects heavier than 10 pounds (gallon milk jug).  Where possible, avoid household activities that involve lifting, bending, reaching, pushing, or pulling such as laundry, vacuuming, grocery shopping, and childcare. Try to arrange for help from friends and family for these activities while your back heals.  Increase physical activity slowly as tolerated.  Taking short walks is encouraged, but avoid strenuous exercise. Do not jog, run, bicycle, lift weights, or participate in any other exercises unless specifically allowed by your doctor.  Talk to your doctor before resuming sexual activity.  You should not drive until cleared by your doctor.  Until released by your doctor, you should not return to work or school.  You should rest at home and let your body heal.   You may shower three days after your surgery.  After showering, lightly dab your incision dry. Do not take a tub bath or go swimming until approved by your  doctor at your follow-up appointment.  If your doctor ordered a cervical collar (neck brace) for you, you should wear it whenever you are out of bed. You may remove it when lying down or sleeping, but you should wear it at all other times. Not all neck surgeries require a cervical collar.  If you smoke, we strongly recommend that you quit.  Smoking has been proven to interfere with normal bone healing and will dramatically reduce the success rate of your surgery. Please contact [facility] (800-QUIT-NOW) and use the resources at www.[facility].com for assistance in stopping smoking.  Surgical Incision   If you have a dressing on your incision, you may remove it two days after your surgery. Keep your incision area clean and dry.  If you have staples or stitches on your incision, you should have a follow up scheduled for removal. If you do not have staples or stitches, you will have steri-strips (small pieces of surgical tape) or Dermabond glue. The steri-strips/glue should begin to peel away within about a week (it is fine if the steri-strips fall off before then). If the strips are still in place one week after your surgery, you may gently remove them.  Diet           You may return to your usual diet. However, you may experience discomfort when swallowing in the first month after your surgery. This is normal. You may find that softer foods are more comfortable for you to swallow. Be sure to stay hydrated.  When to Contact Us  You may experience pain in your   neck and/or pain between your shoulder blades. This is normal and should improve in the next few weeks with the help of pain medication, muscle relaxers, and rest. Some patients report that a warm compress on the back of the neck or between the shoulder blades helps.  However, should you experience any of the following, contact us immediately: New numbness or weakness Pain that is progressively getting worse, and is not relieved by your pain  medication, muscle relaxers, rest, and warm compresses Bleeding, redness, swelling, pain, or drainage from surgical incision Chills or flu-like symptoms Fever greater than 101.0 F (38.3 C) Inability to eat, drink fluids, or take medications Problems with bowel or bladder functions Difficulty breathing or shortness of breath Warmth, tenderness, or swelling in your calf Contact Information During office hours (Monday-Friday 9 am to 5 pm), please call your physician at [phone] and ask for Berdine Addison After hours and weekends, please call [phone] and speak with the neurosurgeon on call For a life-threatening emergency, call 911  Your surgeon has performed an operation on your cervical spine (neck) to relieve pressure on the spinal cord and/or nerves. This involved making an incision in the front of your neck and removing one or more of the discs that support your spine. Next, a small piece of bone, a titanium plate, and screws were used to fuse two or more of the vertebrae (bones) together.  The following are instructions to help in your recovery once you have been discharged from the hospital. Even if you feel well, it is important that you follow these activity guidelines. If you do not let your neck heal properly from the surgery, you can increase the chance of return of your symptoms and other complications.  * Do not take anti-inflammatory medications for 3 months after surgery (naproxen [Aleve], ibuprofen [Advil, Motrin], etc.). These medications can prevent your bones from healing properly.  Celebrex, if prescribed, is ok to take.  Activity    No bending, lifting, or twisting ("BLT"). Avoid lifting objects heavier than 10 pounds (gallon milk jug).  Where possible, avoid household activities that involve lifting, bending, reaching, pushing, or pulling such as laundry, vacuuming, grocery shopping, and childcare. Try to arrange for help from friends and family for these activities  while your back heals.  Increase physical activity slowly as tolerated.  Taking short walks is encouraged, but avoid strenuous exercise. Do not jog, run, bicycle, lift weights, or participate in any other exercises unless specifically allowed by your doctor.  Talk to your doctor before resuming sexual activity.  You should not drive until cleared by your doctor.  Until released by your doctor, you should not return to work or school.  You should rest at home and let your body heal.   You may shower three days after your surgery.  After showering, lightly dab your incision dry. Do not take a tub bath or go swimming until approved by your doctor at your follow-up appointment.  If your doctor ordered a cervical collar (neck brace) for you, you should wear it whenever you are out of bed. You may remove it when lying down or sleeping, but you should wear it at all other times. Not all neck surgeries require a cervical collar.  If you smoke, we strongly recommend that you quit.  Smoking has been proven to interfere with normal bone healing and will dramatically reduce the success rate of your surgery. Please contact [facility] (800-QUIT-NOW) and use the resources at www.[facility]com for assistance in  stopping smoking.  Surgical Incision   If you have a dressing on your incision, you may remove it two days after your surgery. Keep your incision area clean and dry.  If you have staples or stitches on your incision, you should have a follow up scheduled for removal. If you do not have staples or stitches, you will have steri-strips (small pieces of surgical tape) or Dermabond glue. The steri-strips/glue should begin to peel away within about a week (it is fine if the steri-strips fall off before then). If the strips are still in place one week after your surgery, you may gently remove them.  Diet           You may return to your usual diet. However, you may experience discomfort when swallowing in  the first month after your surgery. This is normal. You may find that softer foods are more comfortable for you to swallow. Be sure to stay hydrated.  When to Contact us  You may experience pain in your neck and/or pain between your shoulder blades. This is normal and should improve in the next few weeks with the help of pain medication, muscle relaxers, and rest. Some patients report that a warm compress on the back of the neck or between the shoulder blades helps.  However, should you experience any of the following, contact us immediately: New numbness or weakness Pain that is progressively getting worse, and is not relieved by your pain medication, muscle relaxers, rest, and warm compresses Bleeding, redness, swelling, pain, or drainage from surgical incision Chills or flu-like symptoms Fever greater than 101.0 F (38.3 C) Inability to eat, drink fluids, or take medications Problems with bowel or bladder functions Difficulty breathing or shortness of breath Warmth, tenderness, or swelling in your calf Contact Information During office hours (Monday-Friday 9 am to 5 pm), please call your physician at [phone] and ask for Berdine Addison After hours and weekends, please call [phone] and speak with the neurosurgeon on call For a life-threatening emergency, call 911

## 2022-07-08 NOTE — Anesthesia Procedure Notes (Signed)
Procedure Name: Intubation Date/Time: 07/08/2022 11:34 AM  Performed by: Carmelina Paddock, RNPre-anesthesia Checklist: Patient identified, Patient being monitored, Timeout performed, Emergency Drugs available and Suction available Patient Re-evaluated:Patient Re-evaluated prior to induction Oxygen Delivery Method: Circle system utilized Preoxygenation: Pre-oxygenation with 100% oxygen Induction Type: IV induction Ventilation: Mask ventilation without difficulty Laryngoscope Size: 3 and McGraph Grade View: Grade I Tube type: Oral Tube size: 7.0 mm Number of attempts: 1 Airway Equipment and Method: Stylet Placement Confirmation: ETT inserted through vocal cords under direct vision, positive ETCO2 and breath sounds checked- equal and bilateral Secured at: 21 cm Tube secured with: Tape Dental Injury: Teeth and Oropharynx as per pre-operative assessment

## 2022-07-08 NOTE — H&P (Signed)
Referring Physician:  No referring provider defined for this encounter.  Primary Physician:  Juline Patch, MD  History of Present Illness: 07/08/2022 [name] is here today with a chief complaint of neck and arm pain as noted below.  05/19/2022 [name] is doing slightly better after her recent injection, but still having weakness in her L arm.   04/14/22 [name] is here today with a chief complaint of neck and left arm pain.  She has been having neck pain for quite a period of time.  It is currently 6 out of 10 and interfering with her day-to-day life.  She also has pain into her left shoulder blade and down her left arm.  Her epidural steroid injection listed below did help somewhat with this, though her pain is still impacting her day-to-day life.  She also reports weakness in her left arm as well as some issues with numbness in her hands and feet.   04/01/22 - L C7-T1 ESI   12/11/2021  [name] is doing reasonably well. Her pain is currently 2 out of 10. It was much worse previously.  10/30/21 [name] is here today with a chief complaint of neck pain that radiates into the bilateral shoulders and arms, with the left side much worse than the right. Along with intermittent tingling in the bilateral arms   She has been having neck pain for 1 year. She reports sharp and throbbing pain with sitting and then activity. She has noticed a change in position of her neck and she is having trouble with looking upwards. Most activities make her pain worse. She is also having pain into her left arm and down to all 5 of her fingers.  Bowel/Bladder Dysfunction: none  Conservative measures:  Physical therapy: has participated in 3 PT visits at [facility] (04/03/21 to 04/21/21) with no relief Multimodal medical therapy including regular antiinflammatories: hydrocodone, ibuprofen, robaxin Injections: has had epidural steroid injections 09/29/21: C7-T1 IL ESI (Dr. Holley Raring) no  relief 08/06/21: C7-T1 IL ESI (Dr. Holley Raring) 3 months relief  Past Surgery:  Lumbar Fusion in 2010  Elizabeth Torres has no symptoms of cervical myelopathy.  The symptoms are causing a significant impact on the patient's life.   Review of Systems:  A 10 point review of systems is negative, except for the pertinent positives and negatives detailed in the HPI.  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 RAd and chemo tx   BREAST SURGERY     mastectomy Right    partial with lymph node dissection   PORT A CATH REVISION     PORT-A-CATH REMOVAL  11-07-15   Dr Bary Castilla   SHOULDER ARTHROSCOPY WITH ROTATOR CUFF REPAIR Left 05/18/2019   Procedure:  SHOULDER ARTHROSCOPY WITH SUBACROMIAL DECOMPRESSION AND DISTAL CLAVICAL EXCISION, INTRAARTICULAR DEBRIDEMENT;  Surgeon: Lovell Sheehan, MD;  Location: [place];  Service: Orthopedics;  Laterality: Left;   SPINE SURGERY  2008   Dr Joya Salm   TUBAL LIGATION      Allergies: Allergies as of 05/25/2022 - Review Complete 05/19/2022  Allergen Reaction Noted   Penicillins Anaphylaxis 08/10/2011   Pregabalin Other (See Comments) 07/08/2021   Sulfa antibiotics Anaphylaxis 08/10/2011   Ondansetron  03/12/2015   Other  08/26/2020   Zofran [ondansetron hcl]  02/19/2015    Medications: Current Meds  Medication Sig    hydrochlorothiazide (HYDRODIURIL) 12.5 MG tablet TAKE 1 TABLET(12.5 MG) BY MOUTH DAILY   HYDROcodone-acetaminophen (NORCO) 10-325 MG tablet Take 1 tablet by mouth 5 (five) times daily as needed.   ibuprofen (ADVIL) 600 MG tablet Take 600 mg by mouth every 6 (six) hours as needed.   methocarbamol (ROBAXIN) 500 MG tablet TAKE 1 TABLET(500 MG) BY MOUTH TWICE DAILY AS NEEDED FOR MUSCLE SPASMS   minoxidil (LONITEN) 2.5 MG tablet Take 1.25 mg by mouth 2 (two) times daily.   nortriptyline (PAMELOR) 10 MG capsule TAKE 3 CAPSULES(30 MG) BY MOUTH AT BEDTIME (Patient taking differently: Take 40 mg by mouth at bedtime. TAKE 4 CAPSULES(40 MG) BY MOUTH AT BEDTIME)   Vitamin D, Ergocalciferol, (DRISDOL) 1.25 MG (50000 UNIT) CAPS capsule Take 50,000 Units by mouth once a week.   zolpidem (AMBIEN) 10 MG tablet Take 1 tablet (10 mg total) by mouth at bedtime as needed for sleep. (Patient taking differently: Take 10 mg by mouth at bedtime as needed for sleep. 1/2 tab as needed)    Social History: Social History   Tobacco Use   Smoking status: Every Day    Packs/day: 1.00    Years: 20.00    Total pack years: 20.00    Types: Cigarettes    Last attempt to quit: 01/08/2020    Years since quitting: 2.4   Smokeless tobacco: Never  Vaping Use   Vaping Use: Never used  Substance Use Topics   Alcohol use: Not Currently   Drug use: Never    Family Medical History: Family History  Problem Relation Age of Onset   Diabetes Mother    Hypertension Mother    Pulmonary fibrosis Mother    Cancer Paternal Uncle    Breast cancer Paternal Uncle        45'S   Breast cancer Paternal Aunt        60'S   Other Father        unknown medical history    Physical Examination: Vitals:   07/08/22 0941  BP: 139/79  Pulse: 95  Resp: 20  Temp: 98.3 F (36.8 C)  SpO2: 100%   Heart sounds normal no MRG. Chest Clear to Auscultation Bilaterally.  General: Patient is well developed, well nourished, calm, collected, and  in no apparent distress. Attention to examination is appropriate.  Neck:   Supple.  Full range of motion.  Respiratory: Patient is breathing without any difficulty.   NEUROLOGICAL:     Awake, alert, oriented to person, place, and time.  Speech is clear and fluent. Fund of knowledge is appropriate.   Cranial Nerves: Pupils equal round and reactive to light.  Facial tone is symmetric.  Facial sensation is symmetric. Shoulder shrug is symmetric. Tongue protrusion is midline.  There is no pronator drift.  ROM of spine: full.    Strength: Side Biceps Triceps Deltoid Interossei Grip Wrist  Ext. Wrist Flex.  R _0 L 4 4 4- _1 Side Iliopsoas Quads Hamstring PF DF EHL  R _2 L _3 Reflexes are 1+ and symmetric at the biceps, triceps, brachioradialis, patella and achilles.   Hoffman's is absent.   Bilateral upper and lower extremity sensation is intact to light touch.    No evidence of dysmetria noted.  Gait is normal.     Medical Decision Making  Imaging: MRI C spine 04/29/2021  IMPRESSION:  1. Disc degeneration with reversal of cervical lordosis that has  progressed from a 2011 myelogram.  2. Disc protrusions contact the ventral cord at C5-6 and C6-7.  Diffusely patent spinal canal.  3. Right foraminal impingement at C4-5 and especially C5-6.  4. Moderate left foraminal impingement at C5-6.   Electronically Signed    By: Jorje Guild M.D.    On: 04/30/2021 07:49  C spine xrays 01/31/21 IMPRESSION:  Mid cervical degenerative changes.  Loss of cervical lordosis.   Electronically Signed    By: Nolon Nations M.D.    On: 02/03/2021 13:28  Please note that she has a 23 degree kyphosis between the inferior endplate of C2 and inferior endplate of C7. This is increased from a 15 degree kyphosis from her x-rays in 2017   MRI C spine 05/01/22 IMPRESSION: 1. Multilevel cervical spondylosis as described above, similar to prior study.  Unchanged mild-to-moderate spinal canal stenosis and severe bilateral neuroforaminal stenosis at C5-C6. 2. Unchanged mild-to-moderate spinal canal stenosis at C6-C7. 3. Unchanged moderate right neuroforaminal stenosis at C4-C5.     Electronically Signed   By: Titus Dubin M.D.   On: 05/01/2022 16:32  I have personally reviewed the images and agree with the above interpretation.  Assessment and Plan: Elizabeth Torres is a pleasant 46 y.o. female with  neck pain and cervical kyphosis that is worsening over time. This is centered between C4 and C7. She also has foraminal disc disease and radicular symptoms.  She has significant weakness objectively.  She has tried and failed conservative management.   We will proceed with surgical intervention with a C4-7 anterior cervical discectomy and fusion.     Marcena Dias K. Izora Ribas MD, [facility]

## 2022-07-08 NOTE — Anesthesia Preprocedure Evaluation (Addendum)
Anesthesia Evaluation  Patient identified by MRN, date of birth, ID band Patient awake    Reviewed: Allergy & Precautions, NPO status , Patient's Chart, lab work & pertinent test results  History of Anesthesia Complications Negative for: history of anesthetic complications  Airway Mallampati: II  TM Distance: >3 FB Neck ROM: Full    Dental  (+) Chipped   Pulmonary neg pulmonary ROS, neg COPD, Current SmokerPatient did not abstain from smoking.   Pulmonary exam normal breath sounds clear to auscultation       Cardiovascular hypertension, (-) angina (-) Past MI, (-) CABG and (-) DOE negative cardio ROS Normal cardiovascular exam Rhythm:Regular Rate:Normal     Neuro/Psych  Neuromuscular disease (Chemo-induced neuropathy) negative neurological ROS  negative psych ROS   GI/Hepatic negative GI ROS, Neg liver ROS,neg GERD  ,,  Endo/Other  negative endocrine ROS    Renal/GU      Musculoskeletal   Abdominal   Peds  Hematology negative hematology ROS (+)   Anesthesia Other Findings Past Medical History: No date: BRCA negative 2013: Breast cancer ([place])     Comment:  RT LUMPECTOMY with chemo and rad tx No date: Degenerative disc disease, lumbar No date: Hypertension 06/29/2022: Nose colonized with MRSA 2013: Personal history of chemotherapy     Comment:  for breast ca 2013: Personal history of radiation therapy     Comment:  F/U right breast cancer  No date: Pneumonia 2013: Radiation     Comment:  BREAST CA 2013: Status post chemotherapy     Comment:  BREAST CA  Past Surgical History: 07/2011: BREAST BIOPSY; Right     Comment:  invasive ductal carcinoma 08/2011: BREAST LUMPECTOMY; Right     Comment:  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 RAd               and chemo tx No date: BREAST SURGERY No date: mastectomy; Right     Comment:  partial with lymph node dissection No date: PORT A CATH  REVISION 11-07-15: PORT-A-CATH REMOVAL     Comment:  Dr Bary Castilla 05/18/2019: SHOULDER ARTHROSCOPY WITH ROTATOR CUFF REPAIR; Left     Comment:  Procedure: SHOULDER ARTHROSCOPY WITH SUBACROMIAL               DECOMPRESSION AND DISTAL CLAVICAL EXCISION,               INTRAARTICULAR DEBRIDEMENT;  Surgeon: Lovell Sheehan,               MD;  Location: [place];  Service:               Orthopedics;  Laterality: Left; 2008: SPINE SURGERY     Comment:  Dr Joya Salm No date: TUBAL LIGATION  BMI    Body Mass Index: 27.46 kg/m      Reproductive/Obstetrics negative OB ROS                             Anesthesia Physical Anesthesia Plan  ASA: II  Anesthesia Plan: General   Post-op Pain Management:  Regional for Post-op pain   Induction: Intravenous  PONV Risk Score and Plan: 2 and Ondansetron, Dexamethasone, Midazolam and Treatment may vary due to age or medical condition  Airway Management Planned: Oral ETT  Additional Equipment:   Intra-op Plan:   Post-operative Plan: Extubation in OR  Informed Consent: I have reviewed the patients History and Physical, chart, labs and discussed  the procedure including the risks, benefits and alternatives for the proposed anesthesia with the patient or authorized representative who has indicated his/her understanding and acceptance.     Dental Advisory Given  Plan Discussed with: CRNA and Anesthesiologist  Anesthesia Plan Comments: (Patient consented for risks of anesthesia including but not limited to:  - adverse reactions to medications - damage to eyes, teeth, lips or other oral mucosa - nerve damage due to positioning  - sore throat or hoarseness - Damage to heart, brain, nerves, lungs, other parts of body or loss of life  Patient voiced understanding.)        Anesthesia Quick Evaluation

## 2022-07-08 NOTE — Transfer of Care (Signed)
Immediate Anesthesia Transfer of Care Note  Patient: Elizabeth Torres  Procedure(s) Performed: C4-7 ANTERIOR CERVICAL DISCECTOMY AND FUSION (GLOBUS HEDRON) (Spine Cervical)  Patient Location: PACU  Anesthesia Type:General  Level of Consciousness: awake  Airway & Oxygen Therapy: Patient Spontanous Breathing and Patient connected to face mask oxygen  Post-op Assessment: Report given to RN and Post -op Vital signs reviewed and stable  Post vital signs: Reviewed and stable  Last Vitals:  Vitals Value Taken Time  BP 117/84 07/08/22 1401  Temp 36.6 C 07/08/22 1400  Pulse 93 07/08/22 1406  Resp 16 07/08/22 1406  SpO2 100 % 07/08/22 1406  Vitals shown include unvalidated device data.  Last Pain:  Vitals:   07/08/22 0941  TempSrc: Oral         Complications: No notable events documented.

## 2022-07-09 MED ORDER — OXYCODONE HCL 5 MG PO TABS
ORAL_TABLET | ORAL | Status: AC
  Filled 2022-07-09: qty 2

## 2022-07-09 MED ORDER — KETOROLAC TROMETHAMINE 15 MG/ML IJ SOLN
INTRAMUSCULAR | Status: AC
  Filled 2022-07-09: qty 1

## 2022-07-09 MED ORDER — OXYCODONE HCL 5 MG PO TABS
ORAL_TABLET | ORAL | Status: AC
  Administered 2022-07-09: 10 mg via ORAL
  Filled 2022-07-09: qty 2

## 2022-07-09 MED ORDER — ACETAMINOPHEN 500 MG PO TABS
ORAL_TABLET | ORAL | Status: AC
  Filled 2022-07-09: qty 2

## 2022-07-09 MED ORDER — OXYCODONE HCL 10 MG PO TABS: 5.0000 mg | ORAL_TABLET | ORAL | 0 refills | Status: AC | PRN

## 2022-07-09 MED ORDER — METHOCARBAMOL 500 MG PO TABS: 500.0000 mg | ORAL_TABLET | Freq: Four times a day (QID) | ORAL | 0 refills | Status: DC

## 2022-07-09 MED ORDER — ENOXAPARIN SODIUM 40 MG/0.4ML IJ SOSY
PREFILLED_SYRINGE | INTRAMUSCULAR | Status: AC
  Administered 2022-07-09: 40 mg via SUBCUTANEOUS
  Filled 2022-07-09: qty 0.4

## 2022-07-09 MED ORDER — DOCUSATE SODIUM 100 MG PO CAPS
ORAL_CAPSULE | ORAL | Status: AC
  Administered 2022-07-09: 100 mg via ORAL
  Filled 2022-07-09: qty 1

## 2022-07-09 MED ORDER — METHOCARBAMOL 500 MG PO TABS
ORAL_TABLET | ORAL | Status: AC
  Administered 2022-07-09: 500 mg via ORAL
  Filled 2022-07-09: qty 1

## 2022-07-09 MED ORDER — SENNA 8.6 MG PO TABS
ORAL_TABLET | ORAL | Status: AC
  Administered 2022-07-09: 8.6 mg via ORAL
  Filled 2022-07-09: qty 1

## 2022-07-09 MED ORDER — ACETAMINOPHEN 500 MG PO TABS
ORAL_TABLET | ORAL | Status: AC
  Administered 2022-07-09: 1000 mg via ORAL
  Filled 2022-07-09: qty 2

## 2022-07-09 NOTE — Anesthesia Postprocedure Evaluation (Signed)
Anesthesia Post Note  Patient: Elizabeth Torres  Procedure(s) Performed: C4-7 ANTERIOR CERVICAL DISCECTOMY AND FUSION (GLOBUS HEDRON) (Spine Cervical)  Patient location during evaluation: PACU Anesthesia Type: General Level of consciousness: awake and alert Pain management: pain level controlled Vital Signs Assessment: post-procedure vital signs reviewed and stable Respiratory status: spontaneous breathing, nonlabored ventilation, respiratory function stable and patient connected to nasal cannula oxygen Cardiovascular status: blood pressure returned to baseline and stable Postop Assessment: no apparent nausea or vomiting Anesthetic complications: no  No notable events documented.   Last Vitals:  Vitals:   07/09/22 0438 07/09/22 0738  BP: 128/88 (!) 135/90  Pulse: 92 92  Resp: 16 18  Temp: 36.7 C (!) 36.1 C  SpO2: 97% 100%    Last Pain:  Vitals:   07/09/22 0945  TempSrc:   PainSc: 0-No pain                 Dimas Millin

## 2022-07-09 NOTE — Progress Notes (Signed)
    Attending Progress Note  History: Elizabeth Torres is s/p C4-7 ACDF for cervical radiculopathy and left arm weakness.   POD1: NAEO. Reports expected posterior neck pain and improved left arm pain.   Physical Exam: Vitals:   07/09/22 0438 07/09/22 0738  BP: 128/88 (!) 135/90  Pulse: 92 92  Resp: 16 18  Temp: 98 F (36.7 C) (!) 97 F (36.1 C)  SpO2: 97% 100%    AA Ox3 CNI  Strength: Side Biceps Triceps Deltoid Interossei Grip Wrist Ext. Wrist Flex.  R '5 5 5 5 5 5 5  '$ L 4 4 4- '4 4 5 5    '$ Side Iliopsoas Quads Hamstring PF DF EHL  R '5 5 5 5 5 5  '$ L '5 5 5 5 5 5   '$ Incision c/d/i  Data:  Other tests/results: none   Assessment/Plan:  Elizabeth Torres is a 46 y.o s/p C4-7 ACDF on 07/08/22 for cervical radiculopathy and left arm weakness.   - mobilize - pain control - DVT prophylaxis  Cooper Render PA-C Department of Neurosurgery

## 2022-07-09 NOTE — Progress Notes (Signed)
PT Cancellation Note  Patient Details Name: Elizabeth Torres MRN: [id number] DOB: October 07, 1975   Cancelled Treatment:    Reason Eval/Treat Not Completed: PT screened, no needs identified, will sign off PT observed pt with OT in hallway no assist needed. Pt reported mobility at baseline. PT to sign off. MD notified.   Coryn Mosso O'Daniel, SPT  07/09/2022, 9:43 AM

## 2022-07-09 NOTE — TOC Initial Note (Signed)
Transition of Care [facility]) - Initial/Assessment Note    Patient Details  Name: Elizabeth Torres MRN: [id number] Date of Birth: 1976-06-09  Transition of Care [facility]) CM/SW Contact:    Conception Oms, RN Phone Number: 07/09/2022, 9:22 AM  Clinical Narrative:                   Transition of Care [facility]) Screening Note   Patient Details  Name: Elizabeth Torres Date of Birth: 1976-05-01   Transition of Care [facility]) CM/SW Contact:    Conception Oms, RN Phone Number: 07/09/2022, 9:22 AM    Transition of Care Department [facility]) has reviewed patient and no TOC needs have been identified at this time. We will continue to monitor patient advancement through interdisciplinary progression rounds. If new patient transition needs arise, please place a TOC consult.         Patient Goals and CMS Choice        Expected Discharge Plan and Services           Expected Discharge Date: 07/09/22                                    Prior Living Arrangements/Services                       Activities of Daily Living Home Assistive Devices/Equipment: None ADL Screening (condition at time of admission) Patient's cognitive ability adequate to safely complete daily activities?: Yes Is the patient deaf or have difficulty hearing?: No Does the patient have difficulty seeing, even when wearing glasses/contacts?: Yes Does the patient have difficulty concentrating, remembering, or making decisions?: No Patient able to express need for assistance with ADLs?: Yes Does the patient have difficulty dressing or bathing?: No Independently performs ADLs?: Yes (appropriate for developmental age) Does the patient have difficulty walking or climbing stairs?: No Weakness of Legs: None Weakness of Arms/Hands: None  Permission Sought/Granted                  Emotional Assessment              Admission diagnosis:  S/P cervical spinal fusion [Z98.1] Patient Active Problem List    Diagnosis Date Noted   Other secondary kyphosis, cervical region 07/08/2022   Right arm weakness 07/08/2022   Cervical radiculopathy 07/08/2022   S/P cervical spinal fusion 07/08/2022   Cervical radicular pain 07/24/2021   Spinal stenosis in cervical region 07/24/2021   Chronic pain syndrome 07/24/2021   Cervical spondylosis with radiculopathy 02/25/2021   Mass of soft tissue of upper arm 01/31/2021   Elevated blood-pressure reading, without diagnosis of hypertension 08/26/2020   Menorrhagia with irregular cycle 04/02/2020   Rotator cuff impingement syndrome, left 12/15/2018   Cervical myofascial pain syndrome 02/14/2016   History of breast cancer 11/07/2015   Postlaminectomy syndrome, lumbar region 04/12/2012   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 ([place]) 08/07/2011   PCP:  Juline Patch, MD Pharmacy:   [facility], [address] AT [address] [facility] [place] [address] Phone: [phone] Fax: [phone]  [id number] [place] [address]: [phone]: [address]  [place] [address] [place] [address] Phone: [phone] Fax: [phone]     Social Determinants of Health (SDOH) Interventions    Readmission Risk Interventions     No data to display

## 2022-07-09 NOTE — Discharge Summary (Signed)
Discharge Summary  Patient ID: Elizabeth Torres MRN: [id number] DOB/AGE: [birth year] 46 y.o.  Admit date: 07/08/2022 Discharge date: 07/09/2022  Admission Diagnoses: Cervical radiculopathy M54.12 , Cervical stenosis M48.02, Other secondary kyphosis, cervical region M40.12, Left arm weakness R29.898    Discharge Diagnoses:  Principal Problem:   S/P cervical spinal fusion Active Problems:   Other secondary kyphosis, cervical region   Right arm weakness   Cervical radiculopathy   Discharged Condition: good  Hospital Course:  Elizabeth Torres is a 46 y.o s/p C4-7 ACDF. Her intraoperative course was uncomplicated. She was admitted overnight for pain control and monitoring. She reports improved left arm pain. She was discharged home on POD1 with medications for pain and muscle relaxer.  Consults: None  Significant Diagnostic Studies: none  Treatments: surgery: as above. Please see separately dictated operative report for further details.  Discharge Exam: Blood pressure (!) 135/90, pulse 92, temperature (!) 97 F (36.1 C), temperature source Temporal, resp. rate 18, height '5\' 3"'$  (1.6 m), weight 70.3 kg, SpO2 100 %. CN II-XII grossly intact. Pt wearing glasses.  Strength: Side Biceps Triceps Deltoid Interossei Grip Wrist Ext. Wrist Flex.  R '5 5 5 5 5 5 5  '$ L 4 4 4- '4 4 5 5    '$ Side Iliopsoas Quads Hamstring PF DF EHL  R '5 5 5 5 5 5  '$ L '5 5 5 5 5 5   '$ Incision c/d/I with dermabond in place.  Trachea midline  Disposition: Discharge disposition: 01-Home or Self Care       Discharge Instructions     Diet - low sodium heart healthy   Complete by: As directed    Incentive spirometry RT   Complete by: As directed    Increase activity slowly   Complete by: As directed    No wound care   Complete by: As directed       Allergies as of 07/09/2022       Reactions   Penicillins Anaphylaxis   Pregabalin Other (See Comments)   Thoughts of self harm   Sulfa Antibiotics Anaphylaxis    Ondansetron    migraines   Other    Zofran [ondansetron Hcl]    migraines        Medication List     STOP taking these medications    HYDROcodone-acetaminophen 10-325 MG tablet Commonly known as: NORCO   ibuprofen 600 MG tablet Commonly known as: ADVIL       TAKE these medications    hydrochlorothiazide 12.5 MG tablet Commonly known as: HYDRODIURIL TAKE 1 TABLET(12.5 MG) BY MOUTH DAILY   methocarbamol 500 MG tablet Commonly known as: ROBAXIN TAKE 1 TABLET(500 MG) BY MOUTH TWICE DAILY AS NEEDED FOR MUSCLE SPASMS What changed: Another medication with the same name was added. Make sure you understand how and when to take each.   methocarbamol 500 MG tablet Commonly known as: ROBAXIN Take 1 tablet (500 mg total) by mouth 4 (four) times daily. What changed: You were already taking a medication with the same name, and this prescription was added. Make sure you understand how and when to take each.   minoxidil 2.5 MG tablet Commonly known as: LONITEN Take 1.25 mg by mouth 2 (two) times daily.   nortriptyline 10 MG capsule Commonly known as: PAMELOR TAKE 3 CAPSULES(30 MG) BY MOUTH AT BEDTIME What changed:  how much to take how to take this when to take this additional instructions   Oxycodone HCl 10 MG Tabs Take 0.5-1 tablets (  5-10 mg total) by mouth every 4 (four) hours as needed for up to 5 days for severe pain ((score 7 to 10)).   Vitamin D (Ergocalciferol) 1.25 MG (50000 UNIT) Caps capsule Commonly known as: DRISDOL Take 50,000 Units by mouth once a week.   zolpidem 10 MG tablet Commonly known as: AMBIEN Take 1 tablet (10 mg total) by mouth at bedtime as needed for sleep. What changed: additional instructions         Signed: Loleta Dicker 07/09/2022, 8:42 AM

## 2022-07-09 NOTE — Evaluation (Signed)
Occupational Therapy Evaluation Patient Details Name: Elizabeth Torres MRN: [id number] DOB: 06/27/1976 Today's Date: 07/09/2022   History of Present Illness Elizabeth Torres is a 46 y.o s/p C4-7 ACDF on 07/08/22   Clinical Impression   Elizabeth Torres was seen for OT evaluation this date, POD#1 from above surgery. Prior to hospital admission, pt was IND for I/ADLs and mobility including working full time. Pt lives with spouse and 2 adult children in home c 3 STE. Pt currently near baseline INDEPENDENT function. Dominant LUE minimally weaker however reports at recent baseline. Educated pt on functional application of cervical precautions and adapted bathing strategies with instructions to wait 3 days to shower and limit lifting to 5#. Pt verbalized understanding of all education/training provided and good carry over noted for grooming tasks. No additional skilled OT needs at this time. Will discharge in house. Upon hospital discharge, pt safe to discharge home. Upon hospital discharge, recommend no OT follow up.   Recommendations for follow up therapy are one component of a multi-disciplinary discharge planning process, led by the attending physician.  Recommendations may be updated based on patient status, additional functional criteria and insurance authorization.   Follow Up Recommendations  No OT follow up     Assistance Recommended at Discharge Set up Supervision/Assistance  Patient can return home with the following Assistance with cooking/housework    Functional Status Assessment  Patient has had a recent decline in their functional status and demonstrates the ability to make significant improvements in function in a reasonable and predictable amount of time.  Equipment Recommendations  None recommended by OT    Recommendations for Other Services       Precautions / Restrictions Precautions Precautions: Cervical Precaution Comments: 5lb lifting limit Restrictions Weight Bearing Restrictions: No       Mobility Bed Mobility Overal bed mobility: Independent                  Transfers Overall transfer level: Independent                        Balance Overall balance assessment: Independent                                         ADL either performed or assessed with clinical judgement   ADL Overall ADL's : Independent                                       General ADL Comments: cues for cervical precautions with toothbrushing however INDEPENDENDT for all mobility needs      Pertinent Vitals/Pain Pain Assessment Pain Assessment: No/denies pain     Hand Dominance Left   Extremity/Trunk Assessment Upper Extremity Assessment Upper Extremity Assessment: Overall WFL for tasks assessed;LUE deficits/detail LUE Deficits / Details: dominant LUE minimally weaker than RLE (reports same as recent base line)   Lower Extremity Assessment Lower Extremity Assessment: Overall WFL for tasks assessed       Communication Communication Communication: No difficulties   Cognition Arousal/Alertness: Awake/alert Behavior During Therapy: WFL for tasks assessed/performed Overall Cognitive Status: Within Functional Limits for tasks assessed  Home Living Family/patient expects to be discharged to:: Private residence Living Arrangements: Spouse/significant other Available Help at Discharge: Family;Available 24 hours/day Type of Home: House Home Access: Stairs to enter [employer] of Steps: 3 Entrance Stairs-Rails: Left Home Layout: One level     Bathroom Shower/Tub: Walk-in shower                    Prior Functioning/Environment Prior Level of Function : Independent/Modified Independent;Working/employed;Driving                        OT Problem List: Impaired UE functional use         OT Goals(Current goals can be found in the  care plan section) Acute Rehab OT Goals Patient Stated Goal: to go home OT Goal Formulation: With patient/family Time For Goal Achievement: 07/23/22 Potential to Achieve Goals: Good   AM-PAC OT "6 Clicks" Daily Activity     Outcome Measure Help from another person eating meals?: None Help from another person taking care of personal grooming?: None Help from another person toileting, which includes using toliet, bedpan, or urinal?: None Help from another person bathing (including washing, rinsing, drying)?: None Help from another person to put on and taking off regular upper body clothing?: None Help from another person to put on and taking off regular lower body clothing?: None 6 Click Score: 24   End of Session Nurse Communication: Mobility status  Activity Tolerance: Patient tolerated treatment well Patient left: in chair;with family/visitor present  OT Visit Diagnosis: Muscle weakness (generalized) (M62.81)                Time: 6440-3474 OT Time Calculation (min): 9 min Charges:  OT General Charges $OT Visit: 1 Visit OT Evaluation $OT Eval Low Complexity: 1 Low  Dessie Coma, M.S. OTR/L  07/09/22, 9:31 AM  ascom [phone]

## 2022-07-10 ENCOUNTER — Telehealth: Payer: Self-pay | Admitting: *Deleted

## 2022-07-10 NOTE — Patient Outreach (Signed)
  Care Coordination [facility] Note Transition Care Management Unsuccessful Follow-up Telephone Call  Date of discharge and from where:  [facility] [phone]  Attempts:  2nd Attempt  Reason for unsuccessful TCM follow-up call:  Left voice message  [place] Care Management [phone]

## 2022-07-10 NOTE — Patient Outreach (Signed)
  Care Coordination [facility] Note Transition Care Management Unsuccessful Follow-up Telephone Call  Date of discharge and from where:  [facility] [phone]  Attempts:  1st Attempt  Reason for unsuccessful TCM follow-up call:  Left voice message  [facility] Management [phone]

## 2022-07-14 ENCOUNTER — Telehealth: Payer: Self-pay

## 2022-07-14 ENCOUNTER — Other Ambulatory Visit: Payer: Self-pay | Admitting: Neurosurgery

## 2022-07-14 DIAGNOSIS — M5412 Radiculopathy, cervical region: Secondary | ICD-10-CM

## 2022-07-14 DIAGNOSIS — Z981 Arthrodesis status: Secondary | ICD-10-CM

## 2022-07-14 MED ORDER — METHYLPREDNISOLONE 4 MG PO TBPK: ORAL_TABLET | ORAL | 0 refills | Status: DC

## 2022-07-14 NOTE — Progress Notes (Signed)
I spoke with Elizabeth Torres regarding her pain.  She states that she has had recurrent right radiating arm pain worsened as well for surgery.  She also has right-sided neck pain.  This is likely nerve root irritation.  I recommended that she increase her nortriptyline to 10 mg in the morning and at lunch and continue her 40 mg at night.  I will also send in a Medrol Dosepak.  She was instructed to continue her other medications.  She will keep our office updated on how she is doing.

## 2022-07-14 NOTE — Patient Outreach (Signed)
  Care Coordination TOC Note Transition Care Management Follow-up Telephone Call Date of discharge and from where: 07/09/22-[facility] DX:"S/p cervical spinal fusion" How have you been since you were released from the hospital? Patient states she is having some nerve pain that radiates from her neck down to her arm. She has already called the surgeon office to alert them to see what she can take. She is taking pain med and muscle relaxer q4hrs and voices regimen is managing surgical pain but not touching the new nerve pain. She has been up moving around and ambulating without any issues. She does report some issues with constipation at times. Last BM was two days ago. She is taking stool softener 3x/day. Patient reports some nausea with eating and drinking coffee. She will discuss this with surgeon office as well to see what she can take to help ease sxs.  Any questions or concerns? Yes-as noted above  Items Reviewed: Did the pt receive and understand the discharge instructions provided? Yes  Medications obtained and verified? Yes  Other? Yes  Any new allergies since your discharge? No  Dietary orders reviewed? Yes Do you have support at home? Yes   Home Care and Equipment/Supplies: Were home health services ordered? not applicable If so, what is the name of the agency? N/A  Has the agency set up a time to come to the patient's home? not applicable Were any new equipment or medical supplies ordered?  No What is the name of the medical supply agency? N/A Were you able to get the supplies/equipment? not applicable Do you have any questions related to the use of the equipment or supplies? No  Functional Questionnaire: (I = Independent and D = Dependent) ADLs: A  Bathing/Dressing- A  Meal Prep- A  Eating- I  Maintaining continence- I  Transferring/Ambulation- I  Managing Meds- I  Follow up appointments reviewed:  PCP Hospital f/u appt confirmed? Yes  Scheduled to see Dr. Ronnald Ramp on  07/22/22 @ 10:20am-Patient states she will probably call to reschedule appt as it is so close to surgeon appt. [facility] f/u appt confirmed? Yes  Scheduled to see Dr. Wynonia Musty on 07/23/22 @ 8:30am. Are transportation arrangements needed? No  If their condition worsens, is the pt aware to call PCP or go to the Emergency Dept.? Yes Was the patient provided with contact information for the PCP's office or ED? Yes Was to pt encouraged to call back with questions or concerns? Yes  SDOH assessments and interventions completed:   Yes SDOH Interventions Today    Flowsheet Row Most Recent Value  SDOH Interventions   Food Insecurity Interventions Intervention Not Indicated  Transportation Interventions Intervention Not Indicated       Care Coordination Interventions:  Education provided    Encounter Outcome:  Pt. Visit Completed    Enzo Montgomery, RN,BSN,CCM [facility] Telephonic Care Management Coordinator Direct Phone: [phone] Toll Free: [phone] Fax: [phone]

## 2022-07-14 NOTE — Telephone Encounter (Signed)
-----   Message from Peggyann Shoals sent at 07/14/2022  9:19 AM EST ----- Regarding: postop pain Contact: [phone] C4-7 ACDF on 07/08/22 Patient is has been having pain on the right side of her neck shooting down her arm and hand for the past 3 days. She is taking oxycodone and methocarbamol as directed. That is not helping with that pain. He did mention in the hospital her taking Gabapentin but she declined because in the past it did not work for her. She takes nortriptyline '40mg'$  at night. Should she try taking Gabapentin or what can he recommend for that nerve pain?  [place]

## 2022-07-16 MED ORDER — OXYCODONE HCL 5 MG PO TABS: 5.0000 mg | ORAL_TABLET | ORAL | 0 refills | Status: DC | PRN

## 2022-07-16 NOTE — Telephone Encounter (Signed)
Patient is calling back. She called 07/14/2022 due to pain. She has been taking the steroid since then and has not noticed a difference. The pain has increased since then. What should she do now?

## 2022-07-16 NOTE — Telephone Encounter (Signed)
I spoke with Ms Mcneff. She confirmed he is taking the increased dose of nortriptyline.   She states she ran out of oxycodone a couple of days ago and returned to her hydrocodone for her chronic pain. She did not ask for a refill of the oxycodone b/c she did not know she could.

## 2022-07-16 NOTE — Addendum Note (Signed)
Addended byGeronimo Boot on: 07/16/2022 01:51 PM   Modules accepted: Orders

## 2022-07-16 NOTE — Telephone Encounter (Signed)
Elizabeth Torres had also advised her to increase her nortriptyline to '10mg'$  in morning and at lunch and then continue on '40mg'$  at night. Is she taking this as well?

## 2022-07-16 NOTE — Addendum Note (Signed)
Addended byGeronimo Boot on: 07/16/2022 03:35 PM   Modules accepted: Orders

## 2022-07-16 NOTE — Telephone Encounter (Signed)
C4-7 ACDF on 07/08/22   PMP reviewed and is appropriate. She would be out of her oxycodone.   She is on chronic hydrocodone from pain management. She has been on this for over 10 years. She had stopped her hydrocodone postop and was only taking the oxycodone.   Reviewed with Dr. Izora Ribas. Recommend she stay on her chronic hydrocodone and she can take oxycodone for break thru pain. I called patient and discussed this with her as well. Will take lease amount of oxycodone as possible.   Refill given on oxycodone '5mg'$ . Sent to [facility]. She is aware.

## 2022-07-22 ENCOUNTER — Ambulatory Visit: Payer: BC Managed Care – PPO | Admitting: Family Medicine

## 2022-07-22 NOTE — Progress Notes (Unsigned)
   REFERRING PHYSICIAN:  Juline Patch, Md [address] [place],  [place] [address]  DOS: 07/08/22  ACDF C4-C7  HISTORY OF PRESENT ILLNESS: Elizabeth Torres is 2 weeks status post ACDF C4-C7. Given oxycodone and robaxin on discharge from the hospital.   She called with increased pain last week- she was advised to continue her chronic hydrocodone and take oxycodone for break thru pain. She was also advised to increase her nortriptyline to '10mg'$  bid and '40mg'$  at night.   She was unable to take the oxycodone due to GI upset. She is back on her chronic norco 10 (5 per day) along with robaxin. She has some pain between her shoulder blades. Her preop left arm pain is gone, but she started with some right arm pain 3 days after the surgery.    PHYSICAL EXAMINATION:  NEUROLOGICAL:  General: In no acute distress.   Awake, alert, oriented to person, place, and time.  Pupils equal round and reactive to light.  Facial tone is symmetric.    Strength: Side Biceps Triceps Deltoid Interossei Grip Wrist Ext. Wrist Flex.  R '5 5 5 5 5 5 5  '$ L '5 5 5 5 5 5 5   '$ Incision c/d/i  Imaging:  Nothing new to review.   Assessment / Plan: Elizabeth Torres is doing well s/p above surgery. Treatment options reviewed with patient and following plan made:   - We discussed activity escalation and I have advised the patient to lift up to 10 pounds until 6 weeks after surgery (until your follow up with Dr. Izora Ribas).   - Discussed that new right arm pain should improve with time.  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  - Reviewed wound care.  - Continue current medications including norco 10. Refill given. Reviewed dosing and side effects. PMP reviewed. May need one additional script and then she will get further medications from pain management.  - Continue prn robaxin.  - Follow up as scheduled in 4 weeks and prn.   Advised to contact the office if any questions or concerns  arise.   Geronimo Boot PA-C Dept of Neurosurgery

## 2022-07-23 ENCOUNTER — Ambulatory Visit (INDEPENDENT_AMBULATORY_CARE_PROVIDER_SITE_OTHER): Payer: BC Managed Care – PPO | Admitting: Orthopedic Surgery

## 2022-07-23 ENCOUNTER — Encounter: Payer: Self-pay | Admitting: Orthopedic Surgery

## 2022-07-23 VITALS — BP 122/78 | Temp 97.7°F | Ht 63.0 in | Wt 155.0 lb

## 2022-07-23 DIAGNOSIS — Z09 Encounter for follow-up examination after completed treatment for conditions other than malignant neoplasm: Secondary | ICD-10-CM

## 2022-07-23 DIAGNOSIS — M5412 Radiculopathy, cervical region: Secondary | ICD-10-CM

## 2022-07-23 DIAGNOSIS — M4802 Spinal stenosis, cervical region: Secondary | ICD-10-CM

## 2022-07-23 DIAGNOSIS — Z981 Arthrodesis status: Secondary | ICD-10-CM

## 2022-07-23 MED ORDER — HYDROCODONE-ACETAMINOPHEN 10-325 MG PO TABS: 1.0000 | ORAL_TABLET | ORAL | 0 refills | Status: DC | PRN

## 2022-07-23 NOTE — Patient Instructions (Signed)
It was nice to see you today.  Okay to get incision wet in the shower, do not submerge in pool or hot tub.   Call if any concerns about the incision such as redness, drainage, or fever/chills.   You can lift up to 10 pounds until your follow up with Dr.  Izora Ribas in 4 weeks.   I sent a refill of hydrocodone to your pharmacy. Continue to take the least amount needed and only take for severe pain. Remember this medication can make you sleepy and/or constipated.   Be careful if you are taking tylenol with the hydrocodone, each pill has '325mg'$  of tylenol in it. You do not want to take more than '3000mg'$  of tylenol per day.   We will see you back in 4 weeks for your 6 weeks postop visit.   Please call with any questions or concerns.   Geronimo Boot PA-C [phone]

## 2022-08-03 ENCOUNTER — Other Ambulatory Visit: Payer: Self-pay | Admitting: Neurosurgery

## 2022-08-04 ENCOUNTER — Telehealth: Payer: Self-pay

## 2022-08-04 NOTE — Telephone Encounter (Signed)
Work note done and is printed. Will leave at my desk.

## 2022-08-04 NOTE — Telephone Encounter (Signed)
-----   Message from [place] sent at 08/04/2022  9:43 AM EST ----- Regarding: Release to go back to work Elizabeth Torres called and said that she is needing a letter releasing her to go back to work. She would like to return on 08/10/2022.   [phone]

## 2022-08-04 NOTE — Telephone Encounter (Signed)
What does she do at work? Does she want to go back to regular hours or start with limited hours?   Please find out and let me know.

## 2022-08-04 NOTE — Telephone Encounter (Signed)
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 She knows she can not do heavy lifting but can you add lifting restrictions to the letter. She will pick up the note.

## 2022-08-04 NOTE — Telephone Encounter (Signed)
Patient is aware that note is ready for pick up. She will come on Thursday.

## 2022-08-06 NOTE — Telephone Encounter (Signed)
She picked up the work note. Can you add on there the date her restrictions will end. She will pick up the new note.

## 2022-08-07 NOTE — Telephone Encounter (Signed)
Keep restrictions until f/u with [facility] on 08/25/22.

## 2022-08-07 NOTE — Telephone Encounter (Signed)
When do you want her restrictions to end?

## 2022-08-11 NOTE — Telephone Encounter (Signed)
Work note has been updated.

## 2022-08-11 NOTE — Telephone Encounter (Signed)
She will pick up letter tomorrow.

## 2022-08-14 ENCOUNTER — Institutional Professional Consult (permissible substitution): Payer: BC Managed Care – PPO | Admitting: Student in an Organized Health Care Education/Training Program

## 2022-08-17 ENCOUNTER — Institutional Professional Consult (permissible substitution): Payer: BC Managed Care – PPO | Admitting: Student in an Organized Health Care Education/Training Program

## 2022-08-18 ENCOUNTER — Encounter: Payer: BC Managed Care – PPO | Attending: Physical Medicine and Rehabilitation | Admitting: Registered Nurse

## 2022-08-18 ENCOUNTER — Encounter: Payer: Self-pay | Admitting: Registered Nurse

## 2022-08-18 VITALS — Ht 63.0 in | Wt 150.0 lb

## 2022-08-18 DIAGNOSIS — Z981 Arthrodesis status: Secondary | ICD-10-CM

## 2022-08-18 DIAGNOSIS — G620 Drug-induced polyneuropathy: Secondary | ICD-10-CM

## 2022-08-18 DIAGNOSIS — M542 Cervicalgia: Secondary | ICD-10-CM | POA: Diagnosis not present

## 2022-08-18 DIAGNOSIS — M5416 Radiculopathy, lumbar region: Secondary | ICD-10-CM

## 2022-08-18 DIAGNOSIS — Z5181 Encounter for therapeutic drug level monitoring: Secondary | ICD-10-CM

## 2022-08-18 DIAGNOSIS — M961 Postlaminectomy syndrome, not elsewhere classified: Secondary | ICD-10-CM | POA: Diagnosis not present

## 2022-08-18 DIAGNOSIS — T451X5A Adverse effect of antineoplastic and immunosuppressive drugs, initial encounter: Secondary | ICD-10-CM

## 2022-08-18 DIAGNOSIS — Z79891 Long term (current) use of opiate analgesic: Secondary | ICD-10-CM

## 2022-08-18 DIAGNOSIS — G894 Chronic pain syndrome: Secondary | ICD-10-CM

## 2022-08-18 MED ORDER — HYDROCODONE-ACETAMINOPHEN 10-325 MG PO TABS: 1.0000 | ORAL_TABLET | Freq: Every day | ORAL | 0 refills | Status: DC | PRN

## 2022-08-18 NOTE — Progress Notes (Signed)
Subjective:    Patient ID: Elizabeth Torres, female    DOB: 08-01-1976, 47 y.o.   MRN: [id number]  HPI: [name] is a 47 y.o. female who is scheduled for My- Chart visit today, she s/p C4-7 ANTERIOR CERVICAL DISCECTOMY AND FUSION  on 07/08/2022 by Dr. Izora Ribas, she's not able to drive to [facility] at this time, she reports. Her appointment was changed to My-Chart visit. We have  discussed the limitations of evaluation and management by telemedicine and the availability of in person appointments. The patient expressed understanding and agreed to proceed. She states her pain is located in her neck and lower back pain radiating into her bilateral lower extremities. She rates her pain 5. Her current exercise regime is walking and performing stretching exercises.  [name] Morphine equivalent is 50,00 MME.   Last UDS was Performed on 06/29/2022, + ETOH, she had Nyquil. She was instructed to make sure all cough syrup is alcohol free.     Pain Inventory Average Pain 5 Pain Right Now 5 My pain is constant and aching  In the last 24 hours, has pain interfered with the following? General activity 3 Relation with others 2 Enjoyment of life 2 What TIME of day is your pain at its worst? daytime Sleep (in general) Fair  Pain is worse with: some activites Pain improves with: rest and medication Relief from Meds: 7  Family History  Problem Relation Age of Onset   Diabetes Mother    Hypertension Mother    Pulmonary fibrosis Mother    Cancer Paternal Uncle    Breast cancer Paternal Uncle        9'S   Breast cancer Paternal Aunt        18'S   Other Father        unknown medical history   Social History   Socioeconomic History   Marital status: Married    Spouse name: [name]   Number of children: 3   Years of education: Not on file   Highest education level: Not on file  Occupational History   Not on file  Tobacco Use   Smoking status: Every Day    Packs/day: 1.00    Years:  20.00    Total pack years: 20.00    Types: Cigarettes    Last attempt to quit: 01/08/2020    Years since quitting: 2.6   Smokeless tobacco: Never  Vaping Use   Vaping Use: Never used  Substance and Sexual Activity   Alcohol use: Not Currently   Drug use: Never   Sexual activity: Yes    Partners: Male  Other Topics Concern   Not on file  Social History Narrative   Not on file   Social Determinants of Health   Financial Resource Strain: Not on file  Food Insecurity: No Food Insecurity (07/14/2022)   Hunger Vital Sign    Worried About Running Out of Food in the Last Year: Never true    Ran Out of Food in the Last Year: Never true  Transportation Needs: No Transportation Needs (07/14/2022)   PRAPARE - Hydrologist (Medical): No    Lack of Transportation (Non-Medical): No  Physical Activity: Not on file  Stress: Not on file  Social Connections: Not on file   Past Surgical History:  Procedure Laterality Date   ANTERIOR CERVICAL DECOMP/DISCECTOMY FUSION N/A 07/08/2022   Procedure: C4-7 ANTERIOR CERVICAL DISCECTOMY AND FUSION (GLOBUS HEDRON);  Surgeon: Meade Maw,  MD;  Location: [facility] ORS;  Service: Neurosurgery;  Laterality: N/A;   BREAST BIOPSY Right 07/2011   invasive ductal carcinoma   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 RAd and chemo tx   BREAST SURGERY     mastectomy Right    partial with lymph node dissection   PORT A CATH REVISION     PORT-A-CATH REMOVAL  11-07-15   Dr Bary Castilla   SHOULDER ARTHROSCOPY WITH ROTATOR CUFF REPAIR Left 05/18/2019   Procedure: SHOULDER ARTHROSCOPY WITH SUBACROMIAL DECOMPRESSION AND DISTAL CLAVICAL EXCISION, INTRAARTICULAR DEBRIDEMENT;  Surgeon: Lovell Sheehan, MD;  Location: [place];  Service: Orthopedics;  Laterality: Left;   SPINE SURGERY  2008   Dr Joya Salm   TUBAL LIGATION     Past Surgical History:  Procedure Laterality Date   ANTERIOR  CERVICAL DECOMP/DISCECTOMY FUSION N/A 07/08/2022   Procedure: C4-7 ANTERIOR CERVICAL DISCECTOMY AND FUSION (GLOBUS HEDRON);  Surgeon: Meade Maw, MD;  Location: [facility] OR[facility];  Service: Neurosurgery;  Laterality: N/A;   BREAST BIOPSY Right 07/2011   invasive ductal carcinoma   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 RAd and chemo tx   BREAST SURGERY     mastectomy Right    partial with lymph node dissection   PORT A CATH REVISION     PORT-A-CATH REMOVAL  11-07-15   Dr Bary Castilla   SHOULDER ARTHROSCOPY WITH ROTATOR CUFF REPAIR Left 05/18/2019   Procedure: SHOULDER ARTHROSCOPY WITH SUBACROMIAL DECOMPRESSION AND DISTAL CLAVICAL EXCISION, INTRAARTICULAR DEBRIDEMENT;  Surgeon: Lovell Sheehan, MD;  Location: [place];  Service: Orthopedics;  Laterality: Left;   SPINE SURGERY  2008   Dr Joya Salm   TUBAL LIGATION     Past Medical History:  Diagnosis Date   BRCA negative    Breast cancer ([place]) 2013   RT LUMPECTOMY with chemo and rad tx   Degenerative disc disease, lumbar    Hypertension    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  Opioid Risk Score:   Fall Risk Score:  `1  Depression screen [facility] 2/9     06/16/2022   10:32 AM 05/06/2022    9:53 AM 03/26/2022    8:06 AM 03/17/2022   10:02 AM 02/10/2022    9:54 AM 01/20/2022   10:06 AM 01/13/2022   11:01 AM  Depression screen PHQ 2/9  Decreased Interest 0 0 0 0 0 0 0  Down, Depressed, Hopeless 0 0 0 0 0 0 0  PHQ - 2 Score 0 0 0 0 0 0 0  Altered sleeping      2   Tired, decreased energy      1   Change in appetite      0   Feeling bad or failure about yourself       0   Trouble concentrating      0   Moving slowly or  fidgety/restless      0   Suicidal thoughts      0   PHQ-9 Score      3   Difficult doing work/chores  Not difficult at all     Review of Systems  Musculoskeletal:  Positive for back pain and neck pain.       Pain in both legs  All other systems reviewed and are negative.      Objective:   Physical Exam Vitals and nursing note reviewed.  Musculoskeletal:     Comments: No Physical Exam Performed: My-Chart Visit          Assessment & Plan:  1. Lumbar postlaminectomy syndrome status post L5-S1 fusion with chronic S1 radiculopathy. Continue current medication regimen with Nortriptyline. 08/18/2022. Refilled :  Hydrocodone 10/325 mg #145-one every 6 hours as needed for pain, may take an extra tablet on work days only. 08/18/2022. We will continue the opioid monitoring program, this consists of regular clinic visits, examinations, urine drug screen, pill counts as well as use of [place] Controlled Substance Reporting system. A 12 month History has been reviewed on the [place] on 08/18/2022. 2. 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 08/18/2022 3. Insomnia: Continue current medication regimen Ambien. 08/18/2022  4.Chronic Left Shoulder Pain: No complaints today: S/P Left Shoulder Arthroscopy with Rotator Cuff Repair on 05/18/2019 by Dr. Gunnar Fusi : Ortho Following. 08/18/2022 5. Cervicalgia/ Cervical Radiculitis: Continue Pamelor.S/P on 07/08/2022. with Dr Izora Ribas:  C4-7 ANTERIOR CERVICAL DISCECTOMY AND FUSION (GLOBUS HEDRON) N/A General     . We will continue to monitor.  08/18/2022   F/U in 1 month. My-Chart Visit Established Patient Location of Patient: In her Home Location of Provider: In the Office

## 2022-08-20 ENCOUNTER — Encounter: Payer: BC Managed Care – PPO | Admitting: Neurosurgery

## 2022-08-24 ENCOUNTER — Other Ambulatory Visit: Payer: Self-pay

## 2022-08-24 ENCOUNTER — Ambulatory Visit
Admission: RE | Admit: 2022-08-24 | Discharge: 2022-08-24 | Disposition: A | Discharge status: Home / Self Care | Payer: BC Managed Care – PPO | Attending: Neurosurgery | Admitting: Neurosurgery

## 2022-08-24 ENCOUNTER — Ambulatory Visit
Admission: RE | Admit: 2022-08-24 | Discharge: 2022-08-24 | Disposition: A | Discharge status: Home / Self Care | Payer: BC Managed Care – PPO | Source: Ambulatory Visit | Attending: Neurosurgery | Admitting: Neurosurgery

## 2022-08-24 DIAGNOSIS — Z981 Arthrodesis status: Secondary | ICD-10-CM | POA: Diagnosis not present

## 2022-08-24 DIAGNOSIS — M4312 Spondylolisthesis, cervical region: Secondary | ICD-10-CM | POA: Diagnosis not present

## 2022-08-25 ENCOUNTER — Ambulatory Visit (INDEPENDENT_AMBULATORY_CARE_PROVIDER_SITE_OTHER): Payer: BC Managed Care – PPO | Admitting: Neurosurgery

## 2022-08-25 ENCOUNTER — Encounter: Payer: Self-pay | Admitting: Neurosurgery

## 2022-08-25 VITALS — BP 120/78 | Ht 63.0 in | Wt 150.0 lb

## 2022-08-25 DIAGNOSIS — Z79899 Other long term (current) drug therapy: Secondary | ICD-10-CM | POA: Diagnosis not present

## 2022-08-25 DIAGNOSIS — M5412 Radiculopathy, cervical region: Secondary | ICD-10-CM

## 2022-08-25 DIAGNOSIS — Z7689 Persons encountering health services in other specified circumstances: Secondary | ICD-10-CM | POA: Diagnosis not present

## 2022-08-25 DIAGNOSIS — L309 Dermatitis, unspecified: Secondary | ICD-10-CM | POA: Diagnosis not present

## 2022-08-25 DIAGNOSIS — Z981 Arthrodesis status: Secondary | ICD-10-CM

## 2022-08-25 DIAGNOSIS — L648 Other androgenic alopecia: Secondary | ICD-10-CM | POA: Diagnosis not present

## 2022-08-25 DIAGNOSIS — M4802 Spinal stenosis, cervical region: Secondary | ICD-10-CM

## 2022-08-25 DIAGNOSIS — Z09 Encounter for follow-up examination after completed treatment for conditions other than malignant neoplasm: Secondary | ICD-10-CM

## 2022-08-25 DIAGNOSIS — L650 Telogen effluvium: Secondary | ICD-10-CM | POA: Diagnosis not present

## 2022-08-25 NOTE — Progress Notes (Signed)
   REFERRING PHYSICIAN:  Juline Patch, Md [address] [place] [address],  [place] [address]  DOS: 07/08/22  ACDF C4-C7  HISTORY OF PRESENT ILLNESS: Elizabeth Torres is status post ACDF C4-C7.  She is doing well.  Her arm pain is much improved.  She has pain approximately 2 out of 10 currently.  She is very happy with her relief.    She has no swallowing difficulty.   PHYSICAL EXAMINATION:  NEUROLOGICAL:  General: In no acute distress.   Awake, alert, oriented to person, place, and time.  Pupils equal round and reactive to light.  Facial tone is symmetric.    Strength: Side Biceps Triceps Deltoid Interossei Grip Wrist Ext. Wrist Flex.  R '5 5 5 5 5 5 5  '$ L '5 5 5 5 5 5 5   '$ Incision c/d/i  Imaging:  No complications noted  Assessment / Plan: Elizabeth Torres is doing well s/p above surgery.  I have given her exercises for her neck.  She is on a 25 pound lifting limit.  I will see her back in 6 weeks.  I think she is going to do extremely well from the surgery.    Meade Maw MD Dept of Neurosurgery

## 2022-08-27 ENCOUNTER — Telehealth: Payer: Self-pay

## 2022-08-27 NOTE — Telephone Encounter (Signed)
-----  Message from Magazine sent at 08/27/2022  1:20 PM EST ----- Regarding: Incision Concenrs The incision has developed red bumps that are tender and swelling/getting larger that cover most of the area where you can only see a small bit of the incision itself.   Does have MyChart or can call back either way

## 2022-08-31 ENCOUNTER — Ambulatory Visit (INDEPENDENT_AMBULATORY_CARE_PROVIDER_SITE_OTHER): Payer: BC Managed Care – PPO | Admitting: Student in an Organized Health Care Education/Training Program

## 2022-08-31 ENCOUNTER — Encounter: Payer: Self-pay | Admitting: Student in an Organized Health Care Education/Training Program

## 2022-08-31 VITALS — BP 126/78 | HR 100 | Temp 97.9°F | Ht 63.0 in | Wt 155.0 lb

## 2022-08-31 DIAGNOSIS — F17200 Nicotine dependence, unspecified, uncomplicated: Secondary | ICD-10-CM | POA: Diagnosis not present

## 2022-08-31 DIAGNOSIS — R079 Chest pain, unspecified: Secondary | ICD-10-CM

## 2022-08-31 DIAGNOSIS — R0602 Shortness of breath: Secondary | ICD-10-CM | POA: Diagnosis not present

## 2022-08-31 NOTE — Progress Notes (Signed)
Synopsis: Referred in for lung scarring by Elizabeth Patch, MD  Assessment & Plan:   1. Shortness of breath 2. Chest pain  Mild exertional shortness of breath in the setting of possible scarring noted on CXR. Patient is concerned about pulmonary fibrosis as her mom had it and passed away from it. 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 I will obtain a high resolution CT scan of the chest to rule out interstitial lung disease. I will further obtain a pulmonary function test to assess for any obstruction that could be contributing to her chest pain.  - Pulmonary Function Test [facility] Only; Future - CT CHEST HIGH RESOLUTION; Future  3. Tobacco dependence  Patient counseled, provided with information. Referral sent.  - Ambulatory referral to Smoking Cessation Program   Return in about 3 months (around 11/30/2022).  I spent 50 minutes caring for this patient today, including preparing to see the patient, obtaining a medical history , reviewing a separately obtained history, performing a medically appropriate examination and/or evaluation, counseling and educating the patient/family/caregiver, ordering medications, tests, or procedures, documenting clinical information in the electronic health record, and independently interpreting results (not separately reported/billed) and communicating results to the patient/family/caregiver  Elizabeth Reichert, MD [facility] 08/31/2022 10:00 AM    End of visit medications:  No orders of the defined types were placed in this encounter.    Current Outpatient Medications:    hydrochlorothiazide (HYDRODIURIL) 12.5 MG tablet, TAKE 1 TABLET(12.5 MG) BY MOUTH DAILY, Disp: 90 tablet, Rfl: 1   HYDROcodone-acetaminophen (NORCO) 10-325 MG tablet, Take 1 tablet by mouth 5 (five) times daily as needed., Disp: 150 tablet, Rfl: 0   methocarbamol (ROBAXIN) 500 MG tablet, TAKE 1  TABLET(500 MG) BY MOUTH FOUR TIMES DAILY, Disp: 120 tablet, Rfl: 0   minoxidil (LONITEN) 2.5 MG tablet, Take 1.25 mg by mouth 2 (two) times daily., Disp: , Rfl:    nortriptyline (PAMELOR) 10 MG capsule, TAKE 3 CAPSULES(30 MG) BY MOUTH AT BEDTIME (Patient taking differently: Take 40 mg by mouth at bedtime. TAKE 4 CAPSULES(40 MG) BY MOUTH AT BEDTIME), Disp: 270 capsule, Rfl: 1   Vitamin D, Ergocalciferol, (DRISDOL) 1.25 MG (50000 UNIT) CAPS capsule, Take 50,000 Units by mouth once a week., Disp: , Rfl:    zolpidem (AMBIEN) 10 MG tablet, Take 1 tablet (10 mg total) by mouth at bedtime as needed for sleep. (Patient taking differently: Take 10 mg by mouth at bedtime as needed for sleep. 1/2 tab as needed), Disp: 30 tablet, Rfl: 2   Subjective:   PATIENT ID: Elizabeth Torres GENDER: female DOB: 1976/05/31, MRN: [id number]  Chief Complaint  Patient presents with   pulmonary consult    Chest discomfort/cold sensation with every breath and dry cough mainly at night.      HPI  Elizabeth Torres is a 47 year old female presenting to clinic for the evaluation of chest pain.  She reports symptoms of chest pain with inhalation and exhalation for the past 3 years.  The pain is almost constant but does not interfere with her day-to-day activities.  She does not wake up from sleep secondary to the pain.  She has an occasional cough but no sputum production.  She does not have any wheezing, night sweats, weight loss, fevers, or chills.  She denies any hemoptysis, denies any GI/GU symptoms.  She denies any rashes, joint pains, and joint swelling.  Patient is concerned  that she could have pulmonary fibrosis as her mom died secondary to it. She has no other family history of lung disease besides a brother with asthma. She has no wheezing and no shortness of breath with the cold air, and reports no limitations in activity as a child or young adult. She does tell me that her hair turned gray in her 23s.  She was seen by her  primary care physician and a chest x-ray showed possible scarring for which she was referred to [place].  She recently underwent cervical spinal fusion and has a neck scar that is healing nicely.  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  She has been doing this job for 5 years.  She is a smoker, smoking up to 15 cigarettes a day for the past 20 years.  She does not have any other inhalational exposures.  Ancillary information including prior medications, full medical/surgical/family/social histories, and PFTs (when available) are listed below and have been reviewed.   Review of Systems  Constitutional:  Negative for chills, diaphoresis, fever, malaise/fatigue and weight loss.  HENT:  Negative for hearing loss, sinus pain and sore throat.   Eyes:  Negative for blurred vision.  Respiratory:  Negative for cough, hemoptysis, sputum production, shortness of breath and wheezing.   Cardiovascular:  Positive for chest pain. Negative for palpitations and leg swelling.  Skin:  Negative for itching and rash.     Objective:   Vitals:   08/31/22 0936  BP: 126/78  Pulse: 100  Temp: 97.9 F (36.6 C)  TempSrc: Temporal  SpO2: 100%  Weight: 155 lb (70.3 kg)  Height: '5\' 3"'$  (1.6 m)   100% on RA  BMI Readings from Last 3 Encounters:  08/31/22 27.46 kg/m  08/25/22 26.57 kg/m  08/18/22 26.57 kg/m   Wt Readings from Last 3 Encounters:  08/31/22 155 lb (70.3 kg)  08/25/22 150 lb (68 kg)  08/18/22 150 lb (68 kg)    Physical Exam Constitutional:      General: She is not in acute distress.    Appearance: Normal appearance. She is not ill-appearing.  HENT:     Head: Normocephalic.     Mouth/Throat:     Mouth: Mucous membranes are moist.  Cardiovascular:     Rate and Rhythm: Normal rate and regular rhythm.     Heart sounds: Normal heart sounds.  Pulmonary:     Effort: Pulmonary effort is normal.     Breath sounds: Normal breath sounds. No wheezing or  rales.  Abdominal:     Palpations: Abdomen is soft.  Musculoskeletal:     Right lower leg: No edema.     Left lower leg: No edema.  Neurological:     General: No focal deficit present.     Mental Status: She is alert and oriented to person, place, and time. Mental status is at baseline.     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   Personal history of chemotherapy 2013   for breast ca   Personal history of radiation therapy 2013   F/U right breast cancer    Pneumonia    Radiation 2013   BREAST CA   Status post chemotherapy 2013  BREAST CA     Family History  Problem Relation Age of Onset   Diabetes Mother    Hypertension Mother    Pulmonary fibrosis Mother    Other Father        unknown medical history   Breast cancer Paternal Aunt        16'S   Cancer Paternal Uncle    Breast cancer Paternal Uncle        16'S     Past Surgical History:  Procedure Laterality Date   ANTERIOR CERVICAL DECOMP/DISCECTOMY FUSION N/A 07/08/2022   Procedure: C4-7 ANTERIOR CERVICAL DISCECTOMY AND FUSION (GLOBUS HEDRON);  Surgeon: Meade Maw, MD;  Location: [facility];  Service: Neurosurgery;  Laterality: N/A;   BREAST BIOPSY Right 07/2011   invasive ductal carcinoma   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 RAd and chemo tx   BREAST SURGERY     mastectomy Right    partial with lymph node dissection   PORT A CATH REVISION     PORT-A-CATH REMOVAL  11-07-15   Dr Bary Castilla   SHOULDER ARTHROSCOPY WITH ROTATOR CUFF REPAIR Left 05/18/2019   Procedure: SHOULDER ARTHROSCOPY WITH SUBACROMIAL DECOMPRESSION AND DISTAL CLAVICAL EXCISION, INTRAARTICULAR DEBRIDEMENT;  Surgeon: Lovell Sheehan, MD;  Location: [facility];  Service: Orthopedics;   Laterality: Left;   SPINE SURGERY  2008   Dr Joya Salm   TUBAL LIGATION      Social History   Socioeconomic History   Marital status: Married    Spouse name: Carie Kapuscinski   Number of children: 3   Years of education: Not on file   Highest education level: Not on file  Occupational History   Not on file  Tobacco Use   Smoking status: Every Day    Packs/day: 1.00    Years: 20.00    Total pack years: 20.00    Types: Cigarettes   Smokeless tobacco: Never   Tobacco comments:    0.75PPD 08/31/2022  Vaping Use   Vaping Use: Never used  Substance and Sexual Activity   Alcohol use: Not Currently   Drug use: Never   Sexual activity: Yes    Partners: Male  Other Topics Concern   Not on file  Social History Narrative   Not on file   Social Determinants of Health   Financial Resource Strain: Not on file  Food Insecurity: No Food Insecurity (07/14/2022)   Hunger Vital Sign    Worried About Running Out of Food in the Last Year: Never true    Ran Out of Food in the Last Year: Never true  Transportation Needs: No Transportation Needs (07/14/2022)   PRAPARE - Hydrologist (Medical): No    Lack of Transportation (Non-Medical): No  Physical Activity: Not on file  Stress: Not on file  Social Connections: Not on file  Intimate Partner Violence: Not At Risk (07/08/2022)   Humiliation, Afraid, Rape, and Kick questionnaire    Fear of Current or Ex-Partner: No    Emotionally Abused: No    Physically Abused: No    Sexually Abused: No     Allergies  Allergen Reactions   Penicillins Anaphylaxis   Pregabalin Other (See Comments)    Thoughts of self harm   Sulfa Antibiotics Anaphylaxis   Ondansetron     migraines   Other    Zofran [Ondansetron Hcl]     migraines     CBC    Component Value  Date/Time   WBC 7.3 06/29/2022 0935   RBC 4.54 06/29/2022 0935   HGB 14.3 06/29/2022 0935   HGB 14.0 07/22/2021 0825   HGB 13.6 08/10/2011 1631   HCT 41.5  06/29/2022 0935   HCT 41.0 07/22/2021 0825   HCT 40.7 08/10/2011 1631   PLT 270 06/29/2022 0935   PLT 274 07/22/2021 0825   MCV 91.4 06/29/2022 0935   MCV 95 07/22/2021 0825   MCV 94 04/26/2012 0450   MCV 95.5 08/10/2011 1631   MCH 31.5 06/29/2022 0935   MCHC 34.5 06/29/2022 0935   RDW 14.0 06/29/2022 0935   RDW 12.2 07/22/2021 0825   RDW 14.2 04/26/2012 0450   RDW 14.0 08/10/2011 1631   LYMPHSABS 2.6 07/22/2021 0825   LYMPHSABS 1.8 04/26/2012 0450   LYMPHSABS 3.3 08/10/2011 1631   MONOABS 0.8 10/09/2017 1005   MONOABS 0.6 04/26/2012 0450   MONOABS 0.6 08/10/2011 1631   EOSABS 0.6 (H) 07/22/2021 0825   EOSABS 1.3 (H) 04/26/2012 0450   BASOSABS 0.1 07/22/2021 0825   BASOSABS 0.1 04/26/2012 0450   BASOSABS 0.0 08/10/2011 1631    Pulmonary Functions Testing Results:     No data to display          Outpatient Medications Prior to Visit  Medication Sig Dispense Refill   hydrochlorothiazide (HYDRODIURIL) 12.5 MG tablet TAKE 1 TABLET(12.5 MG) BY MOUTH DAILY 90 tablet 1   HYDROcodone-acetaminophen (NORCO) 10-325 MG tablet Take 1 tablet by mouth 5 (five) times daily as needed. 150 tablet 0   methocarbamol (ROBAXIN) 500 MG tablet TAKE 1 TABLET(500 MG) BY MOUTH FOUR TIMES DAILY 120 tablet 0   minoxidil (LONITEN) 2.5 MG tablet Take 1.25 mg by mouth 2 (two) times daily.     nortriptyline (PAMELOR) 10 MG capsule TAKE 3 CAPSULES(30 MG) BY MOUTH AT BEDTIME (Patient taking differently: Take 40 mg by mouth at bedtime. TAKE 4 CAPSULES(40 MG) BY MOUTH AT BEDTIME) 270 capsule 1   Vitamin D, Ergocalciferol, (DRISDOL) 1.25 MG (50000 UNIT) CAPS capsule Take 50,000 Units by mouth once a week.     zolpidem (AMBIEN) 10 MG tablet Take 1 tablet (10 mg total) by mouth at bedtime as needed for sleep. (Patient taking differently: Take 10 mg by mouth at bedtime as needed for sleep. 1/2 tab as needed) 30 tablet 2   No facility-administered medications prior to visit.

## 2022-08-31 NOTE — Patient Instructions (Signed)
The [facility] Quitline: Call 1-800-QUIT-NOW 575-707-4353). The [place] Quitline is a free service for [employer]. Trained counselors are available from 8 am until 3 am, 365 days per year. Services are available in both [place] and [place].   Web Resources Free online support programs can help you track your progress and share experiences with others who are quitting. These are examples: www.becomeanex.org www.trytostop.org  www.smokefree.gov  www.[employer].com.br.aspx  [facility]: offers comprehensive in-person tobacco treatment counseling at [facility] ([address], [place] [address]).  Open to everyone. Virtual appointments available. Free parking. Call [phone] to schedule an appointment or [phone] for general information.    Tobacco Cessation Medications  Nicotine Replacement Therapy (NRT)  Nicotine is the addictive part of tobacco smoke, but not the most dangerous part. There are 7000 other toxins in cigarettes, including carbon monoxide, that cause disease. People do not generally become addicted to medication. Common problems: People don't use enough medication or stop too early. Medications are safe and effective. Overdose is very uncommon. Use medications as long as needed (3 months minimum). Some combinations work better than single medications. Long acting medications like the NRT patch and bupropion provide continuous treatment for withdrawal symptoms.  PLUS  Short acting medications like the NRT gum, lozenge, inhaler, and nasal spray help people to cope with breakthrough cravings.  ? Nicotine Patch  Place patch on hairless skin on upper body, including arms and back. Each day: discard old patch, shower, apply new patch to a different site. Apply hydrocortisone cream to mildly red/irritated areas. Call provider if rash develops. If patch causes sleep disturbance, remove patch  at bedtime and replace each morning after shower. Side effects may include: skin irritation, headache, insomnia, abnormal/vivid dreams.  ? Nicotine Gum  Chew gum slowly, park in cheek when peppery taste or tingling sensation begins (about 15-30 chews). When taste or tingling goes away, begin chewing again. Use until nicotine is gone (taste or tingle does not return, usually 30 minutes). Park in different areas of mouth. Nicotine is absorbed through the lining of the mouth. Use enough to control cravings, up to 24 pieces per day (if used alone). Avoid eating or drinking for 15 minutes before using and during use. Side effects may include: mouth/jaw soreness, hiccups, indigestion, hypersalivation.  If gum is not chewed correctly, additional side effects may include lightheadedness, nausea/vomiting, throat and mouth irritation.  ? Nicotine Lozenge  Allow to dissolve slowly in mouth (20-30 minutes). Do not chew or swallow. Nicotine release may cause a warm tingling sensation. Occasionally rotate to different areas of the mouth. Use enough to control cravings, up to 20 lozenges per day (if used alone). Avoid eating or drinking for 15 minutes before using and during use. Side effects may include: nausea, hiccups, cough, heartburn, headache, gas, insomnia.  ? Nicotine Nasal Spray Use 1 spray in each nostril (1 dose) and tilt head back for 1 minute. Do not sniff, swallow, or inhale through nose.  Use at least 8 doses (1 spray in each nostril) , up to 40 doses per day (if used alone). To reduce nasal irritation, spray on cotton swab and insert into nose. Side effects may include: nasal and/or throat irritation (hot, peppery, or burning sensation), nasal irritation, tearing, sneezing, cough, headache.  ? Nicotine Oral Inhaler (puffer) Inhale into the back of the throat or puff in short breaths. Do not inhale into the lungs.  Puff continuously for 20 minutes (about 80 puffs) until cartridge  is  empty. Change cartridge when it loses the "burning in throat" sensation (feels like air only). Open cartridges can be saved and used again within 24 hours. Use at least 6 and up to 16 cartridges per day (if used alone).  Avoid eating or drinking for 15 minutes before using and during use. Side effects may include: mouth and/or throat irritation, unpleasant taste, cough, nasal irritation, indigestion, hiccups, headache.  ? Chantix (varenicline) Days 1-3: Take one 0.5 mg white pill each morning for 3 days, one week before quit date. Days 4-7: Increase to one 0.5 mg white pill twice a day in morning and evening for 4 days.  On Day 8 (target quit date), increase to one 1 mg blue pill twice a day. Maintain this dose for a minimum of 3 months. Take with food and a full glass of water to reduce nausea. Be sure that the two doses are at least 8 hours apart, but try to take second dose early in the evening (i.e. 6 pm) to avoid sleep problems. Common side effects include: nausea, insomnia, headache, abnormal/vivid dreams. Tell your doctor if you have any history of psychiatric illness prior to starting Chantix.  STOP taking CHANTIX and contact a healthcare provider immediately if you experience agitation, hostility, depressed mood, changes in thoughts or behavior that are not typical for you, thinking about or attempting suicide, allergic or skin reactions including swelling, rash, redness, or peeling of the skin.  For patients who have heart disease: Smoking is a major risk factor for cardiovascular disease, and Chantix can help you quit smoking. Chantix may be associated with a small, increased risk of certain heart events in patients who have heart disease. If you have any new or worsening symptoms of heart disease while taking Chantix, such as shortness of breath or trouble breathing, new or worsening chest pain, or new or worsening pain in your legs when walking, call your doctor or get emergency medical  help immediately.  ? Wellbutrin / Zyban (bupropion) Take one 150 mg pill each morning for 3 days, one week before target quit date. On Day 4, increase to one 150 mg pill twice a day, morning and evening.  Maintain this dose for a minimum of 3 months. Be sure that the two doses are at least 8 hours apart, but try to take second dose early in the evening (i.e. 6 pm) to avoid sleep problems. Avoid or minimize use of alcohol when taking this medication. Common side effects include: dry mouth, headache, insomnia, nausea, weight loss.  Risk of seizure is 08/998. STOP taking BUPROPION and contact a healthcare provider immediately if you experience agitation, hostility, depressed mood, changes in thoughts or behavior that are not typical for you, thinking about or attempting suicide, allergic or skin reactions including swelling, rash, redness, or peeling of the skin.

## 2022-09-01 ENCOUNTER — Telehealth: Payer: Self-pay

## 2022-09-01 NOTE — Telephone Encounter (Signed)
Elizabeth Reichert, MD  Linwood Dibbles, CMA [facility] - can you call the patient and let her know that [facility] doesn't have a smoking cessation program. She should refer to the information I provided her in the AVS (multiple phone numbers and websites to help with smoking cessation).  Thanks Habib  Patient is aware of above information and voiced her understanding. Nothing further needed.

## 2022-09-04 ENCOUNTER — Other Ambulatory Visit: Payer: Self-pay

## 2022-09-04 DIAGNOSIS — I10 Essential (primary) hypertension: Secondary | ICD-10-CM

## 2022-09-04 MED ORDER — HYDROCHLOROTHIAZIDE 12.5 MG PO TABS: ORAL_TABLET | ORAL | 0 refills | Status: DC

## 2022-09-09 ENCOUNTER — Ambulatory Visit
Admission: RE | Admit: 2022-09-09 | Discharge: 2022-09-09 | Disposition: A | Discharge status: Home / Self Care | Payer: BC Managed Care – PPO | Source: Ambulatory Visit | Attending: Family Medicine | Admitting: Family Medicine

## 2022-09-09 DIAGNOSIS — J929 Pleural plaque without asbestos: Secondary | ICD-10-CM | POA: Diagnosis not present

## 2022-09-09 DIAGNOSIS — R918 Other nonspecific abnormal finding of lung field: Secondary | ICD-10-CM | POA: Diagnosis not present

## 2022-09-09 DIAGNOSIS — R0602 Shortness of breath: Secondary | ICD-10-CM | POA: Diagnosis not present

## 2022-09-09 DIAGNOSIS — J432 Centrilobular emphysema: Secondary | ICD-10-CM | POA: Diagnosis not present

## 2022-09-09 DIAGNOSIS — I700 Atherosclerosis of aorta: Secondary | ICD-10-CM | POA: Diagnosis not present

## 2022-09-10 ENCOUNTER — Encounter: Payer: Self-pay | Admitting: Student in an Organized Health Care Education/Training Program

## 2022-09-10 ENCOUNTER — Encounter: Payer: Self-pay | Admitting: Family Medicine

## 2022-09-10 ENCOUNTER — Encounter: Payer: Self-pay | Admitting: Registered Nurse

## 2022-09-10 ENCOUNTER — Encounter: Payer: BC Managed Care – PPO | Attending: Physical Medicine and Rehabilitation | Admitting: Registered Nurse

## 2022-09-10 VITALS — BP 133/84 | HR 100 | Ht 63.0 in | Wt 154.0 lb

## 2022-09-10 DIAGNOSIS — M5416 Radiculopathy, lumbar region: Secondary | ICD-10-CM | POA: Diagnosis not present

## 2022-09-10 DIAGNOSIS — Z5181 Encounter for therapeutic drug level monitoring: Secondary | ICD-10-CM

## 2022-09-10 DIAGNOSIS — M961 Postlaminectomy syndrome, not elsewhere classified: Secondary | ICD-10-CM | POA: Diagnosis not present

## 2022-09-10 DIAGNOSIS — G894 Chronic pain syndrome: Secondary | ICD-10-CM | POA: Diagnosis not present

## 2022-09-10 DIAGNOSIS — G620 Drug-induced polyneuropathy: Secondary | ICD-10-CM | POA: Diagnosis not present

## 2022-09-10 DIAGNOSIS — Z79891 Long term (current) use of opiate analgesic: Secondary | ICD-10-CM

## 2022-09-10 DIAGNOSIS — T451X5A Adverse effect of antineoplastic and immunosuppressive drugs, initial encounter: Secondary | ICD-10-CM | POA: Diagnosis not present

## 2022-09-10 MED ORDER — ZOLPIDEM TARTRATE 10 MG PO TABS: 10.0000 mg | ORAL_TABLET | Freq: Every evening | ORAL | 2 refills | Status: DC | PRN

## 2022-09-10 MED ORDER — HYDROCODONE-ACETAMINOPHEN 10-325 MG PO TABS: 1.0000 | ORAL_TABLET | Freq: Every day | ORAL | 0 refills | Status: DC | PRN

## 2022-09-10 NOTE — Progress Notes (Deleted)
   Subjective:    Patient ID: Elizabeth Torres, female    DOB: August 23, 1975, 47 y.o.   MRN: [id number]  HPI   .cpr Review of Systems     Objective:   Physical Exam        Assessment & Plan:

## 2022-09-10 NOTE — Addendum Note (Signed)
Addended byArmando Reichert on: 09/10/2022 06:14 PM   Modules accepted: Orders

## 2022-09-10 NOTE — Progress Notes (Signed)
Subjective:    Patient ID: Elizabeth Torres, female    DOB: September 25, 1975, 47 y.o.   MRN: [id number]  HPI: Elizabeth Torres is a 47 y.o. female who returns for follow up appointment for chronic pain and medication refill. She states her pain is located in her lower back radiating into her bilateral lower extremities. She rates her pain 3. Her current exercise regime is walking and performing stretching exercises.  Elizabeth Torres Morphine equivalent is 50.00 MME.   Last UDS was Performed on 06/29/2022, see notes for details.    Pain Inventory Average Pain 3 Pain Right Now 3 My pain is constant and dull  In the last 24 hours, has pain interfered with the following? General activity 3 Relation with others 3 Enjoyment of life 3 What TIME of day is your pain at its worst? daytime Sleep (in general) Fair  Pain is worse with: standing Pain improves with: rest and medication Relief from Meds: 5  Family History  Problem Relation Age of Onset   Diabetes Mother    Hypertension Mother    Pulmonary fibrosis Mother    Other Father        unknown medical history   Breast cancer Paternal Aunt        60'S   Cancer Paternal Uncle    Breast cancer Paternal Uncle        54'S   Social History   Socioeconomic History   Marital status: Married    Spouse name: Elizabeth Torres   Number of children: 3   Years of education: Not on file   Highest education level: Not on file  Occupational History   Not on file  Tobacco Use   Smoking status: Every Day    Packs/day: 1.00    Years: 20.00    Total pack years: 20.00    Types: Cigarettes   Smokeless tobacco: Never   Tobacco comments:    0.75PPD 08/31/2022  Vaping Use   Vaping Use: Never used  Substance and Sexual Activity   Alcohol use: Not Currently   Drug use: Never   Sexual activity: Yes    Partners: Male  Other Topics Concern   Not on file  Social History Narrative   Not on file   Social Determinants of Health   Financial Resource Strain: Not on  file  Food Insecurity: No Food Insecurity (07/14/2022)   Hunger Vital Sign    Worried About Running Out of Food in the Last Year: Never true    [place] in the Last Year: Never true  Transportation Needs: No Transportation Needs (07/14/2022)   PRAPARE - Hydrologist (Medical): No    Lack of Transportation (Non-Medical): No  Physical Activity: Not on file  Stress: Not on file  Social Connections: Not on file   Past Surgical History:  Procedure Laterality Date   ANTERIOR CERVICAL DECOMP/DISCECTOMY FUSION N/A 07/08/2022   Procedure: C4-7 ANTERIOR CERVICAL DISCECTOMY AND FUSION (GLOBUS HEDRON);  Surgeon: Meade Maw, MD;  Location: [facility];  Service: Neurosurgery;  Laterality: N/A;   BREAST BIOPSY Right 07/2011   invasive ductal carcinoma   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 RAd and chemo tx   BREAST SURGERY     mastectomy Right    partial with lymph node dissection   PORT A CATH REVISION     PORT-A-CATH REMOVAL  11-07-15   Dr Bary Castilla   SHOULDER  ARTHROSCOPY WITH ROTATOR CUFF REPAIR Left 05/18/2019   Procedure: SHOULDER ARTHROSCOPY WITH SUBACROMIAL DECOMPRESSION AND DISTAL CLAVICAL EXCISION, INTRAARTICULAR DEBRIDEMENT;  Surgeon: Lovell Sheehan, MD;  Location: [place];  Service: Orthopedics;  Laterality: Left;   SPINE SURGERY  2008   Dr Joya Salm   TUBAL LIGATION     Past Surgical History:  Procedure Laterality Date   ANTERIOR CERVICAL DECOMP/DISCECTOMY FUSION N/A 07/08/2022   Procedure: C4-7 ANTERIOR CERVICAL DISCECTOMY AND FUSION (GLOBUS HEDRON);  Surgeon: Meade Maw, MD;  Location: [facility];  Service: Neurosurgery;  Laterality: N/A;   BREAST BIOPSY Right 07/2011   invasive ductal carcinoma   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 RAd and chemo tx   BREAST SURGERY     mastectomy Right    partial with lymph node dissection    PORT A CATH REVISION     PORT-A-CATH REMOVAL  11-07-15   Dr Bary Castilla   SHOULDER ARTHROSCOPY WITH ROTATOR CUFF REPAIR Left 05/18/2019   Procedure: SHOULDER ARTHROSCOPY WITH SUBACROMIAL DECOMPRESSION AND DISTAL CLAVICAL EXCISION, INTRAARTICULAR DEBRIDEMENT;  Surgeon: Lovell Sheehan, MD;  Location: [place];  Service: Orthopedics;  Laterality: Left;   SPINE SURGERY  2008   Dr Joya Salm   TUBAL LIGATION     Past Medical History:  Diagnosis Date   BRCA negative    Breast cancer (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  Opioid Risk Score:   Fall Risk Score:  `1  Depression screen [facility] 2/9     08/18/2022    9:27 AM 06/16/2022   10:32 AM 05/06/2022    9:53 AM 03/26/2022    8:06 AM 03/17/2022   10:02 AM 02/10/2022    9:54 AM 01/20/2022   10:06 AM  Depression screen PHQ 2/9  Decreased Interest 0 0 0 0 0 0 0  Down, Depressed, Hopeless 0 0 0 0 0 0 0  PHQ - 2 Score 0 0 0 0 0 0 0  Altered sleeping       2  Tired, decreased energy       1  Change in appetite       0  Feeling bad or failure about yourself        0  Trouble concentrating       0  Moving slowly or fidgety/restless       0  Suicidal thoughts       0  PHQ-9 Score       3  Difficult doing work/chores       Not difficult at all    Review of Systems  Musculoskeletal:  Positive for back pain.       Pain both legs  All other systems reviewed and are negative.      Objective:   Physical Exam Vitals and nursing note reviewed.  Constitutional:      Appearance: Normal appearance.  Cardiovascular:     Rate and Rhythm: Normal rate and regular rhythm.  Pulses: Normal pulses.     Heart sounds: Normal heart sounds.  Pulmonary:     Effort: Pulmonary effort is normal.     Breath sounds: Normal breath sounds.  Musculoskeletal:     Cervical back: Normal range of motion and neck supple.     Comments: Normal Muscle Bulk and Muscle Testing Reveals:  Upper Extremities: Full ROM and Muscle Strength 5/5 Lower Extremities: Full ROM and Muscle Strength 5/5 Arises from chair with ease Narrow Based  Gait     Skin:    General: Skin is warm and dry.  Neurological:     Mental Status: She is alert and oriented to person, place, and time.  Psychiatric:        Mood and Affect: Mood normal.        Behavior: Behavior normal.         Assessment & Plan:  1. Lumbar postlaminectomy syndrome status post L5-S1 fusion with chronic S1 radiculopathy. Continue current medication regimen with Nortriptyline. 09/10/2022. Refilled :  Hydrocodone 10/325 mg #145-one every 6 hours as needed for pain, may take an extra tablet on work days only. 09/10/2022. We will continue the opioid monitoring program, this consists of regular clinic visits, examinations, urine drug screen, pill counts as well as use of [place] Controlled Substance Reporting system. A 12 month History has been reviewed on the [place] on 09/10/2022. 2. 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 09/10/2022 3. Insomnia: Continue current medication regimen Ambien. 09/10/2022  4.Chronic Left Shoulder Pain: No complaints today: S/P Left Shoulder Arthroscopy with Rotator Cuff Repair on 05/18/2019 by Dr. Gunnar Fusi : Ortho Following. 09/10/2022 5. Cervicalgia/ Cervical Radiculitis: Continue Pamelor.S/P on 07/08/2022. with Dr Izora Ribas:  C4-7 ANTERIOR CERVICAL DISCECTOMY AND FUSION (GLOBUS HEDRON) N/A General     . We will continue to monitor.  09/10/2022   F/U in 1  month.

## 2022-09-15 ENCOUNTER — Encounter: Payer: Self-pay | Admitting: Family Medicine

## 2022-09-15 ENCOUNTER — Ambulatory Visit (INDEPENDENT_AMBULATORY_CARE_PROVIDER_SITE_OTHER): Payer: BC Managed Care – PPO | Admitting: Family Medicine

## 2022-09-15 VITALS — BP 120/78 | HR 102 | Temp 99.8°F | Ht 63.0 in | Wt 154.0 lb

## 2022-09-15 DIAGNOSIS — E7801 Familial hypercholesterolemia: Secondary | ICD-10-CM

## 2022-09-15 DIAGNOSIS — R051 Acute cough: Secondary | ICD-10-CM | POA: Diagnosis not present

## 2022-09-15 DIAGNOSIS — R509 Fever, unspecified: Secondary | ICD-10-CM

## 2022-09-15 DIAGNOSIS — J0100 Acute maxillary sinusitis, unspecified: Secondary | ICD-10-CM | POA: Diagnosis not present

## 2022-09-15 DIAGNOSIS — I10 Essential (primary) hypertension: Secondary | ICD-10-CM

## 2022-09-15 DIAGNOSIS — Z1211 Encounter for screening for malignant neoplasm of colon: Secondary | ICD-10-CM

## 2022-09-15 LAB — POCT INFLUENZA A/B
Influenza A, POC: NEGATIVE
Influenza B, POC: NEGATIVE

## 2022-09-15 LAB — POC COVID19 BINAXNOW: SARS Coronavirus 2 Ag: NEGATIVE

## 2022-09-15 MED ORDER — AZITHROMYCIN 250 MG PO TABS: ORAL_TABLET | ORAL | 0 refills | Status: AC

## 2022-09-15 MED ORDER — HYDROCHLOROTHIAZIDE 12.5 MG PO TABS: ORAL_TABLET | ORAL | 1 refills | Status: DC

## 2022-09-15 MED ORDER — PROMETHAZINE-DM 6.25-15 MG/5ML PO SYRP: 5.0000 mL | ORAL_SOLUTION | Freq: Four times a day (QID) | ORAL | 0 refills | Status: DC | PRN

## 2022-09-15 NOTE — Progress Notes (Signed)
Date:  09/15/2022   Name:  Elizabeth Torres   DOB:  1976-05-01   MRN:  [id number]   Chief Complaint: Hypertension, Colon Cancer Screening, and Cough (Cough, cong, diarrhea, fever)  Hypertension This is a new problem. The current episode started more than 1 month ago. The problem has been waxing and waning since onset. The problem is uncontrolled. Associated symptoms include chest pain, headaches, palpitations and shortness of breath. Pertinent negatives include no anxiety. There are no associated agents to hypertension. Past treatments include diuretics. The current treatment provides moderate improvement. There are no compliance problems.  Hypertensive end-organ damage includes CAD/MI.  Cough This is a new problem. The current episode started yesterday. The problem has been waxing and waning. The cough is Non-productive. Associated symptoms include chest pain, chills, a fever, headaches and shortness of breath. Pertinent negatives include no ear pain, nasal congestion, postnasal drip, rhinorrhea, sore throat or wheezing. The symptoms are aggravated by lying down. Treatments tried: dayquil. The treatment provided mild relief.  Fever  This is a new problem. The current episode started yesterday. The problem occurs daily. The maximum temperature noted was 100 to 100.9 F. Associated symptoms include chest pain, congestion, coughing, diarrhea, headaches and muscle aches. Pertinent negatives include no ear pain, sore throat or wheezing. Treatments tried: dayquil.  Risk factors: hx of cancer     Lab Results  Component Value Date   NA 136 06/29/2022   K 4.3 06/29/2022   CO2 29 06/29/2022   GLUCOSE 93 06/29/2022   BUN 11 06/29/2022   CREATININE 0.65 06/29/2022   CALCIUM 9.5 06/29/2022   EGFR 107 01/20/2022   GFRNONAA >60 06/29/2022   Lab Results  Component Value Date   CHOL 181 01/20/2022   HDL 62 01/20/2022   LDLCALC 100 (H) 01/20/2022   TRIG 107 01/20/2022   Lab Results  Component Value  Date   TSH 2.360 07/22/2021   No results found for: "HGBA1C" Lab Results  Component Value Date   WBC 7.3 06/29/2022   HGB 14.3 06/29/2022   HCT 41.5 06/29/2022   MCV 91.4 06/29/2022   PLT 270 06/29/2022   Lab Results  Component Value Date   ALT 26 01/26/2020   AST 23 01/26/2020   ALKPHOS 57 01/26/2020   BILITOT 0.3 01/26/2020   No results found for: "25OHVITD2", "25OHVITD3", "VD25OH"   Review of Systems  Constitutional:  Positive for chills and fever.  HENT:  Positive for congestion. Negative for ear pain, postnasal drip, rhinorrhea and sore throat.   Respiratory:  Positive for cough and shortness of breath. Negative for wheezing.   Cardiovascular:  Positive for chest pain and palpitations.  Gastrointestinal:  Positive for diarrhea.  Neurological:  Positive for headaches.    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   Right arm weakness 07/08/2022   Cervical radiculopathy 07/08/2022   S/P cervical spinal fusion 07/08/2022   Cervical radicular pain 07/24/2021   Spinal stenosis in cervical region 07/24/2021   Chronic pain syndrome 07/24/2021   Cervical spondylosis with radiculopathy 02/25/2021   Mass of soft tissue of upper arm 01/31/2021   Elevated blood-pressure reading, without diagnosis of hypertension 08/26/2020   Menorrhagia with irregular cycle 04/02/2020   Rotator cuff impingement syndrome, left 12/15/2018   Cervical myofascial pain syndrome 02/14/2016   History of breast cancer 11/07/2015   Postlaminectomy syndrome, lumbar region 04/12/2012   Chemotherapy-induced neuropathy ([place]) 04/12/2012   Primary  cancer of lower outer quadrant of right female breast ([place]) 08/07/2011    Allergies  Allergen Reactions   Penicillins Anaphylaxis   Pregabalin Other (See Comments)    Thoughts of self harm   Sulfa Antibiotics Anaphylaxis   Ondansetron     migraines   Other    Zofran [Ondansetron Hcl]      migraines    Past Surgical History:  Procedure Laterality Date   ANTERIOR CERVICAL DECOMP/DISCECTOMY FUSION N/A 07/08/2022   Procedure: C4-7 ANTERIOR CERVICAL DISCECTOMY AND FUSION (GLOBUS HEDRON);  Surgeon: Meade Maw, MD;  Location: [facility];  Service: Neurosurgery;  Laterality: N/A;   BREAST BIOPSY Right 07/2011   invasive ductal carcinoma   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 RAd and chemo tx   BREAST SURGERY     mastectomy Right    partial with lymph node dissection   PORT A CATH REVISION     PORT-A-CATH REMOVAL  11-07-15   Dr Bary Castilla   SHOULDER ARTHROSCOPY WITH ROTATOR CUFF REPAIR Left 05/18/2019   Procedure: SHOULDER ARTHROSCOPY WITH SUBACROMIAL DECOMPRESSION AND DISTAL CLAVICAL EXCISION, INTRAARTICULAR DEBRIDEMENT;  Surgeon: Lovell Sheehan, MD;  Location: [place];  Service: Orthopedics;  Laterality: Left;   SPINE SURGERY  2008   Dr Joya Salm   TUBAL LIGATION      Social History   Tobacco Use   Smoking status: Every Day    Packs/day: 1.00    Years: 20.00    Total pack years: 20.00    Types: Cigarettes   Smokeless tobacco: Never   Tobacco comments:    0.75PPD 08/31/2022  Vaping Use   Vaping Use: Never used  Substance Use Topics   Alcohol use: Not Currently   Drug use: Never     Medication list has been reviewed and updated.  Current Meds  Medication Sig   hydrochlorothiazide (HYDRODIURIL) 12.5 MG tablet TAKE 1 TABLET(12.5 MG) BY MOUTH DAILY   HYDROcodone-acetaminophen (NORCO) 10-325 MG tablet Take 1 tablet by mouth 5 (five) times daily as needed.   methocarbamol (ROBAXIN) 500 MG tablet TAKE 1 TABLET(500 MG) BY MOUTH FOUR TIMES DAILY   minoxidil (LONITEN) 2.5 MG tablet Take 1.25 mg by mouth 2 (two) times daily.   nortriptyline (PAMELOR) 10 MG capsule TAKE 3 CAPSULES(30 MG) BY MOUTH AT BEDTIME (Patient taking differently: Take 40 mg by mouth at bedtime. TAKE 4 CAPSULES(40 MG) BY MOUTH AT BEDTIME)    Vitamin D, Ergocalciferol, (DRISDOL) 1.25 MG (50000 UNIT) CAPS capsule Take 50,000 Units by mouth once a week.   zolpidem (AMBIEN) 10 MG tablet Take 1 tablet (10 mg total) by mouth at bedtime as needed for sleep.       01/20/2022   10:06 AM 12/17/2021   10:43 AM 07/22/2021    8:00 AM 04/21/2021   11:30 AM  GAD 7 : Generalized Anxiety Score  Nervous, Anxious, on Edge 0 0 0 0  Control/stop worrying 0 0 0 0  Worry too much - different things 2 1 0 0  Trouble relaxing 0 0 0 1  Restless 0 0 0 0  Easily annoyed or irritable 1 0 0 0  Afraid - awful might happen 1 1 0 0  Total GAD 7 Score 4 2 0 1  Anxiety Difficulty Not difficult at all Not difficult at all  Not difficult at all       09/10/2022    9:14 AM 08/18/2022    9:27 AM 06/16/2022  10:32 AM  Depression screen PHQ 2/9  Decreased Interest 0 0 0  Down, Depressed, Hopeless 0 0 0  PHQ - 2 Score 0 0 0    BP Readings from Last 3 Encounters:  09/15/22 120/78  09/10/22 133/84  08/31/22 126/78    Physical Exam Vitals and nursing note reviewed. Exam conducted with a chaperone present.  Constitutional:      General: She is not in acute distress.    Appearance: She is not diaphoretic.  HENT:     Head: Normocephalic and atraumatic.     Right Ear: Tympanic membrane and external ear normal.     Left Ear: Tympanic membrane and external ear normal.     Nose:     Right Sinus: Maxillary sinus tenderness present. No frontal sinus tenderness.     Left Sinus: Maxillary sinus tenderness present. No frontal sinus tenderness.     Mouth/Throat:     Mouth: Mucous membranes are moist.  Eyes:     General:        Right eye: No discharge.        Left eye: No discharge.     Conjunctiva/sclera: Conjunctivae normal.     Pupils: Pupils are equal, round, and reactive to light.  Neck:     Thyroid: No thyromegaly.     Vascular: No JVD.  Cardiovascular:     Rate and Rhythm: Normal rate and regular rhythm.     Heart sounds: Normal heart sounds, S1  normal and S2 normal. No murmur heard.    No systolic murmur is present.     No diastolic murmur is present.     No friction rub. No gallop. No S3 or S4 sounds.  Pulmonary:     Effort: Pulmonary effort is normal.     Breath sounds: Normal breath sounds. No decreased breath sounds, wheezing, rhonchi or rales.  Abdominal:     General: Bowel sounds are normal.     Palpations: Abdomen is soft. There is no mass.     Tenderness: There is no abdominal tenderness. There is no guarding.  Musculoskeletal:        General: Normal range of motion.     Cervical back: Normal range of motion and neck supple.  Lymphadenopathy:     Cervical: No cervical adenopathy.  Skin:    General: Skin is warm and dry.  Neurological:     Mental Status: She is alert.     Deep Tendon Reflexes: Reflexes are normal and symmetric.     Wt Readings from Last 3 Encounters:  09/15/22 154 lb (69.9 kg)  09/10/22 154 lb (69.9 kg)  08/31/22 155 lb (70.3 kg)    BP 120/78   Pulse (!) 102   Temp 99.8 F (37.7 C) (Oral)   Ht 5' 3"$  (1.6 m)   Wt 154 lb (69.9 kg)   SpO2 95%   BMI 27.28 kg/m   Assessment and Plan:  1. Fever and chills New onset fever and chills yesterday.  Checking of COVID is negative checking of influenza AB is negative - POC COVID-19 - POCT Influenza A/B  2. Acute cough Cough associated with above we will prescribe promethazine with dextromethorphan teaspoon every 6 hours as needed. - promethazine-dextromethorphan (PROMETHAZINE-DM) 6.25-15 MG/5ML syrup; Take 5 mLs by mouth 4 (four) times daily as needed for cough.  Dispense: 118 mL; Refill: 0  3. Acute maxillary sinusitis, recurrence not specified New onset.  Persistent.  Stable.  History and examination consistent with maxillary sinusitis.  Will treat with azithromycin to 50 mg 2 today followed by 1 a day for 4 days. - azithromycin (ZITHROMAX) 250 MG tablet; Take 2 tablets on day 1, then 1 tablet daily on days 2 through 5  Dispense: 6 tablet;  Refill: 0  4. Familial hypercholesterolemia Mild elevation with atherosclerosis seen on the last CT scan for lung.  Will check lipid panel for current status of control with diet. - Lipid Panel With LDL/HDL Ratio  5. Essential hypertension Chronic.  Controlled.  Stable.  Blood pressure today 120/78.  Continue hydrochlorothiazide 12.5 mg daily.  To see cardiologist and if necessary they may be prescribing another medication or additional medication given her history of coronary artery disease seen on CT scan.  Will check CMP for electrolytes and GFR. - hydrochlorothiazide (HYDRODIURIL) 12.5 MG tablet; TAKE 1 TABLET(12.5 MG) BY MOUTH DAILY  Dispense: 90 tablet; Refill: 1 - Lipid Panel With LDL/HDL Ratio - Comprehensive Metabolic Panel (CMET)  6. Colon cancer screening Discussed with patient and referral to gastroenterology made for colonoscopy. - Ambulatory referral to Gastroenterology    Otilio Miu, MD

## 2022-09-15 NOTE — Patient Instructions (Signed)
GUIDELINES FOR  LOW-CHOLESTEROL, LOW-TRIGLYCERIDE DIETS    FOODS TO USE   MEATS, FISH Choose lean meats (chicken, [place], veal, and non-fatty cuts of beef with excess fat trimmed; one serving = 3 oz of cooked meat). Also, fresh or frozen fish, canned fish packed in water, and shellfish (lobster, crabs, shrimp, and oysters). Limit use to no more than one serving of one of these per week. Shellfish are high in cholesterol but low in saturated fat and should be used sparingly. Meats and fish should be broiled (pan or oven) or baked on a rack.  EGGS Egg substitutes and egg whites (use freely). Egg yolks (limit two per week).  FRUITS Eat three servings of fresh fruit per day (1 serving =  cup). Be sure to have at least one citrus fruit daily. Frozen and canned fruit with no sugar or syrup added may be used.  VEGETABLES Most vegetables are not limited (see next page). One dark-green (string beans, escarole) or one deep yellow (squash) vegetable is recommended daily. Cauliflower, broccoli, and celery, as well as potato skins, are recommended for their fiber content. (Fiber is associated with cholesterol reduction) It is preferable to steam vegetables, but they may be boiled, strained, or braised with polyunsaturated vegetable oil (see below).  BEANS Dried peas or beans (1 serving =  cup) may be used as a bread substitute.  NUTS Almonds, walnuts, and peanuts may be used sparingly  (1 serving = 1 Tablespoonful). Use pumpkin, sesame, or sunflower seeds.  BREADS, GRAINS One roll or one slice of whole grain or enriched bread may be used, or three soda crackers or four pieces of melba toast as a substitute. Spaghetti, rice or noodles ( cup) or  large ear of corn may be used as a bread substitute. In preparing these foods do not use butter or shortening, use soft margarine. Also use egg and sugar substitutes.  Choose high fiber grains, such as oats and whole wheat.  CEREALS Use  cup of hot cereal or  cup of  cold cereal per day. Add a sugar substitute if desired, with 99% fat free or skim milk.  MILK PRODUCTS Always use 99% fat free or skim milk, dairy products such as low fat cheeses (farmer's uncreamed diet cottage), low-fat yogurt, and powdered skim milk.  FATS, OILS Use soft (not stick) margarine; vegetable oils that are high in polyunsaturated fats (such as safflower, sunflower, soybean, corn, and cottonseed). Always refrigerate meat drippings to harden the fat and remove it before preparing gravies  DESSERTS, SNACKS Limit to two servings per day; substitute each serving for a bread/cereal serving: ice milk, water sherbet (1/4 cup); unflavored gelatin or gelatin flavored with sugar substitute (1/3 cup); pudding prepared with skim milk (1/2 cup); egg white souffls; unbuttered popcorn (1  cups). Substitute carob for chocolate.  BEVERAGES Fresh fruit juices (limit 4 oz per day); black coffee, plain or herbal teas; soft drinks with sugar substitutes; club soda, preferably salt-free; cocoa made with skim milk or nonfat dried milk and water (sugar substitute added if desired); clear broth. Alcohol: limit two servings per day (see second page).  MISCELLANEOUS  You may use the following freely: vinegar, spices, herbs, nonfat bouillon, mustard, Worcestershire sauce, soy sauce, flavoring essence.                  GUIDELINES FOR  LOW-CHOLESTEROL, LOW TRIGLYCERIDE DIETS    FOODS TO AVOID   MEATS, FISH Marbled beef, pork, bacon, sausage, and other pork products; fatty  fowl (duck, goose); skin and fat of turkey and chicken; processed meats; luncheon meats (salami, bologna); frankfurters and fast-food hamburgers (theyre loaded with fat); organ meats (kidneys, liver); canned fish packed in oil.  °EGGS Limit egg yolks to two per week.   °FRUITS Coconuts (rich in saturated fats).  °VEGETABLES Avoid avocados. Starchy vegetables (potatoes, corn, lima beans, dried peas, beans) may be used only if  substitutes for a serving of bread or cereal. (Baked potato skin, however, is desirable for its fiber content.  °BEANS Commercial baked beans with sugar and/or pork added.  °NUTS Avoid nuts.  Limit peanuts and walnuts to one tablespoonful per day.  °BREADS, GRAINS Any baked goods with shortening and/or sugar. Commercial mixes with dried eggs and whole milk. Avoid sweet rolls, doughnuts, breakfast pastries (Danish), and sweetened packaged cereals (the added sugar converts readily to triglycerides).  °MILK PRODUCTS Whole milk and whole-milk packaged goods; cream; ice cream; whole-milk puddings, yogurt, or cheeses; nondairy cream substitutes.  °FATS, OILS Butter, lard, animal fats, bacon drippings, gravies, cream sauces as well as palm and coconut oils. All these are high in saturated fats. Examine labels on cholesterol free products for hydrogenated fats. (These are oils that have been hardened into solids and in the process have become saturated.)  °DESSERTS, SNACKS Fried snack foods like potato chips; chocolate; candies in general; jams, jellies, syrups; whole- milk puddings; ice cream and milk sherbets; hydrogenated peanut butter.  °BEVERAGES Sugared fruit juices and soft drinks; cocoa made with whole milk and/or sugar. When using alcohol (1 oz liquor, 5 oz beer, or 2 ½ oz dry table wine per serving), one serving must be substituted for one bread or cereal serving (limit, two servings of alcohol per day).  ° SPECIAL NOTES  °  Remember that even non-limited foods should be used in moderation. °While on a cholesterol-lowering diet, be sure to avoid animal fats and marbled meats. °3. While on a triglyceride-lowering diet, be sure to avoid sweets and to control the amount of carbohydrates you eat (starchy foods such as flour, bread, potatoes).While on a tri-glyceride-lowering diet, be sure to avoid sweets °Buy a good low-fat cookbook, such as the one published by the American Heart Association. °Consult your physician  if you have any questions.  ° ° ° ° ° ° ° ° ° ° ° ° ° °[facility] Low Glycemic Diet Plan ° ° °Low Glycemic Foods (20-49) Moderate Glycemic Foods (50-69) High Glycemic Foods (70-100)  °    °Breakfast Creals Breakfast Cereals Breakfast Cereals  °All Bran All-Bran Fruit'n Oats  ° Bran Buds Bran Chex  ° Cheerios Corn chex  °  °Fiber One Oatmeal (not instant)  ° Just Right Mini-Wheats  ° Corn Flakes Cream of Wheat  °  °Oat Bran Special K Swiss Muesli  ° Grape Nuts Grape Nut Flakes  °  °  Grits Nutri-Grain  °  °Fruits and fruit juice: Fruits Puffed Rice Puffed Wheat  °  °(Limit to 1-2 Servings per day) Banana (under-ride) Dates  ° Rice Chex Rice Krispies  °  °Apples Apricots (fresh/dried)  ° Figs Grapes  ° Shredded Wheat Team  °  °Blackberries Blueberries  ° Kiwi Mango  ° Total   °  °Cherries Cranberries  ° Oranges Raisins  °   °Peaches Pears  °  Fruits  °Plums Prunes  ° Fruit Juices Pineapple Watermelon  °  °Grapefruit Raspberries  ° Cranberry Juice Orange Juice  ° Banana (over-ripe)   °  °Strawberries Tangerines  °    °  Apple Juice Grapefruit Juice  ° Beans and Legumes Beverages  °Tomato Juice   ° [place]-type baked beans Sodas, sweet tea, pineapple juice  ° Canned pinto, kidney, or navy beans   °Beans and Legumes (fresh-cooked) Green peas Vegetables  °Black-eyed peas Butter Beans  °  Potato, baked, boiled, fried, mashed  °Chick peas Lentils  ° Vegetables French fries  °Green beans Lima beans  ° Beets Carrots  ° Canned or frozen corn  °Kidney beans Navy beans  ° Sweet potato Yam  ° Parsnips  °Pinto beans Snow peas  ° Corn on the cob Winter squash  °    °Non-starchy vegetables Grains Breads  °Asparagus, avocado, broccoli, cabbage Cornmeal Rice, brown  ° Most breads (white and whole grain)  °cauliflower, celery, cucumber, greens Rice, white Couscous  ° Bagels Bread sticks  °  °lettuce, mushrooms, peppers, tomatoes  Bread stuffing Kaiser roll  °  °okra, onions, spinach, summer squash Pasta Dinner rolls  ° Macaroni  Pizza, cheese  °   °Grains Ravioli, meat filled Spaghetti, white  ° Grains  °Barley Bulgur  °  Rice, instant Tapioca, with milk  °  °Rye Wild rice  ° Nuts   ° Cashews Macadamia  ° Candy and most cookies  °Nuts and oils    °Almonds, peanuts, sunflower seeds Snacks Snacks  °hazelnuts, pecans, walnuts Chocolate Ice cream, lowfat  ° Donuts Corn chips  °  °Oils that are liquid at room temperature Muffin Popcorn  ° Jelly beans Pretzels  °  °  Pastries  °Dairy, fish, meat, soy, and eggs    °Milk, skim Lowfat cheese  °  Restaurant and ethnic foods  °Yogurt, lowfat, fruit sugar sweetened  Most Chinese food (sugar in stir fry  °  or wok sauce)  °Lean red meat Fish  °  Teriyaki-style meats and vegetables  °Skinless chicken and turkey, shellfish    °    °Egg whites (up to 3 daily), Soy Products    °Egg yolks (up to 7 or _____ per week)    °  °

## 2022-09-16 ENCOUNTER — Ambulatory Visit: Payer: BC Managed Care – PPO | Attending: Cardiovascular Disease | Admitting: Cardiovascular Disease

## 2022-09-16 ENCOUNTER — Encounter: Payer: Self-pay | Admitting: Cardiovascular Disease

## 2022-09-16 VITALS — BP 100/64 | HR 98 | Ht 63.0 in | Wt 154.4 lb

## 2022-09-16 DIAGNOSIS — R072 Precordial pain: Secondary | ICD-10-CM | POA: Diagnosis not present

## 2022-09-16 DIAGNOSIS — R0602 Shortness of breath: Secondary | ICD-10-CM | POA: Diagnosis not present

## 2022-09-16 DIAGNOSIS — I2510 Atherosclerotic heart disease of native coronary artery without angina pectoris: Secondary | ICD-10-CM

## 2022-09-16 DIAGNOSIS — E785 Hyperlipidemia, unspecified: Secondary | ICD-10-CM

## 2022-09-16 DIAGNOSIS — Z720 Tobacco use: Secondary | ICD-10-CM | POA: Diagnosis not present

## 2022-09-16 MED ORDER — ASPIRIN 81 MG PO TBEC: 81.0000 mg | DELAYED_RELEASE_TABLET | Freq: Every day | ORAL | 3 refills | Status: AC

## 2022-09-16 MED ORDER — ATORVASTATIN CALCIUM 20 MG PO TABS: 20.0000 mg | ORAL_TABLET | Freq: Every day | ORAL | 3 refills | Status: DC

## 2022-09-16 NOTE — Patient Instructions (Addendum)
Medication Instructions:  START Aspirin 81 mg once daily  START Atorvastatin 20 mg once daily  *If you need a refill on your cardiac medications before your next appointment, please call your pharmacy*   Lab Work: None ordered If you have labs (blood work) drawn today and your tests are completely normal, you will receive your results only by: [place] (if you have MyChart) OR A paper copy in the mail If you have any lab test that is abnormal or we need to change your treatment, we will call you to review the results.   Testing/Procedures: Your physician has requested that you have an echocardiogram. Echocardiography is a painless test that uses sound waves to create images of your heart. It provides your doctor with information about the size and shape of your heart and how well your heart's chambers and valves are working.   You may receive an ultrasound enhancing agent through an IV if needed to better visualize your heart during the echo. This procedure takes approximately one hour.  There are no restrictions for this procedure.  This will take place at [place] ([place] [id number], [place]  Your provider has ordered a Exercise Myoview Stress test. This will take place at [facility]. Please report to the [facility] medical mall entrance. The volunteers at the first desk will direct you where to go.  [place]  Your provider has ordered a Stress Test with nuclear imaging. The purpose of this test is to evaluate the blood supply to your heart muscle. This procedure is referred to as a "Non-Invasive Stress Test." This is because other than having an IV started in your vein, nothing is inserted or "invades" your body. Cardiac stress tests are done to find areas of poor blood flow to the heart by determining the extent of coronary artery disease (CAD). Some patients exercise on a treadmill, which naturally increases the blood flow to your heart, while others who  are unable to walk on a treadmill due to physical limitations will have a pharmacologic/chemical stress agent called Lexiscan . This medicine will mimic walking on a treadmill by temporarily increasing your coronary blood flow.   Please note: these test may take anywhere between 2-4 hours to complete  How to prepare for your Myoview test:  Nothing to eat for 6 hours prior to the test No caffeine for 24 hours prior to test No smoking 24 hours prior to test. Your medication may be taken with water.  If your doctor stopped a medication because of this test, do not take that medication. Ladies, please do not wear dresses.  Skirts or pants are appropriate. Please wear a short sleeve shirt. No perfume, cologne or lotion. Wear comfortable walking shoes. No heels!   PLEASE NOTIFY THE OFFICE AT LEAST 9 HOURS IN ADVANCE IF YOU ARE UNABLE TO KEEP YOUR APPOINTMENT.  [phone] AND  PLEASE NOTIFY NUCLEAR MEDICINE AT [facility] AT LEAST 24 HOURS IN ADVANCE IF YOU ARE UNABLE TO KEEP YOUR APPOINTMENT. [phone]    Follow-Up: At [facility], you and your health needs are our priority.  As part of our continuing mission to provide you with exceptional heart care, we have created designated Provider Care Teams.  These Care Teams include your primary Cardiologist (physician) and Advanced Practice Providers (APPs -  Physician Assistants and Nurse Practitioners) who all work together to provide you with the care you need, when you need it.  We recommend signing up for the patient  portal called "MyChart".  Sign up information is provided on this After Visit Summary.  MyChart is used to connect with patients for Virtual Visits (Telemedicine).  Patients are able to view lab/test results, encounter notes, upcoming appointments, etc.  Non-urgent messages can be sent to your provider as well.   To learn more about what you can do with MyChart, go to [email or web address].    Your next appointment:   2  month(s)  Provider:   You may see Dr. Fletcher Anon or one of the following Advanced Practice Providers on your designated Care Team:   Murray Hodgkins, NP Christell Faith, PA-C Cadence Kathlen Mody, PA-C Gerrie Nordmann, NP    Managing the Challenge of Quitting Smoking Quitting smoking is a physical and mental challenge. You may have cravings, withdrawal symptoms, and temptation to smoke. Before quitting, work with your health care provider to make a plan that can help you manage quitting. Making a plan before you quit may keep you from smoking when you have the urge to smoke while trying to quit. How to manage lifestyle changes Managing stress Stress can make you want to smoke, and wanting to smoke may cause stress. It is important to find ways to manage your stress. You could try some of the following: Practice relaxation techniques. Breathe slowly and deeply, in through your nose and out through your mouth. Listen to music. Soak in a bath or take a shower. Imagine a peaceful place or vacation. Get some support. Talk with family or friends about your stress. Join a support group. Talk with a counselor or therapist. Get some physical activity. Go for a walk, run, or bike ride. Play a favorite sport. Practice yoga.  Medicines Talk with your health care provider about medicines that might help you deal with cravings and make quitting easier for you. Relationships Social situations can be difficult when you are quitting smoking. To manage this, you can: Avoid parties and other social situations where people might be smoking. Avoid alcohol. Leave right away if you have the urge to smoke. Explain to your family and friends that you are quitting smoking. Ask for support and let them know you might be a bit grumpy. Plan activities where smoking is not an option. General instructions Be aware that many people gain weight after they quit smoking. However, not everyone does. To keep from gaining weight,  have a plan in place before you quit, and stick to the plan after you quit. Your plan should include: Eating healthy snacks. When you have a craving, it may help to: Eat popcorn, or try carrots, celery, or other cut vegetables. Chew sugar-free gum. Changing how you eat. Eat small portion sizes at meals. Eat 4-6 small meals throughout the day instead of 1-2 large meals a day. Be mindful when you eat. You should avoid watching television or doing other things that might distract you as you eat. Exercising regularly. Make time to exercise each day. If you do not have time for a long workout, do short bouts of exercise for 5-10 minutes several times a day. Do some form of strengthening exercise, such as weight lifting. Do some exercise that gets your heart beating and causes you to breathe deeply, such as walking fast, running, swimming, or biking. This is very important. Drinking plenty of water or other low-calorie or no-calorie drinks. Drink enough fluid to keep your urine pale yellow.  How to recognize withdrawal symptoms Your body and mind may experience discomfort as you try to get used  to not having nicotine in your system. These effects are called withdrawal symptoms. They may include: Feeling hungrier than normal. Having trouble concentrating. Feeling irritable or restless. Having trouble sleeping. Feeling depressed. Craving a cigarette. These symptoms may surprise you, but they are normal to have when quitting smoking. To manage withdrawal symptoms: Avoid places, people, and activities that trigger your cravings. Remember why you want to quit. Get plenty of sleep. Avoid coffee and other drinks that contain caffeine. These may worsen some of your symptoms. How to manage cravings Come up with a plan for how to deal with your cravings. The plan should include the following: A definition of the specific situation you want to deal with. An activity or action you will take to replace  smoking. A clear idea for how this action will help. The name of someone who could help you with this. Cravings usually last for 5-10 minutes. Consider taking the following actions to help you with your plan to deal with cravings: Keep your mouth busy. Chew sugar-free gum. Suck on hard candies or a straw. Brush your teeth. Keep your hands and body busy. Change to a different activity right away. Squeeze or play with a ball. Do an activity or a hobby, such as making bead jewelry, practicing needlepoint, or working with wood. Mix up your normal routine. Take a short exercise break. Go for a quick walk, or run up and down stairs. Focus on doing something kind or helpful for someone else. Call a friend or family member to talk during a craving. Join a support group. Contact a quitline. Where to find support To get help or find a support group: Call the [facility]'s Smoking Quitline: 1-800-QUIT-NOW 734-611-5167) Text QUIT to SmokefreeTXTMQ:317211 Where to find more information Visit these websites to find more information on quitting smoking: U.S. Department of Health and Human Services: www.smokefree.gov American Lung Association: www.freedomfromsmoking.org Centers for Disease Control and Prevention (CDC): http://www.wolf.info/ American Heart Association: www.heart.org Contact a health care provider if: You want to change your plan for quitting. The medicines you are taking are not helping. Your eating feels out of control or you cannot sleep. You feel depressed or become very anxious. Summary Quitting smoking is a physical and mental challenge. You will face cravings, withdrawal symptoms, and temptation to smoke again. Preparation can help you as you go through these challenges. Try different techniques to manage stress, handle social situations, and prevent weight gain. You can deal with cravings by keeping your mouth busy (such as by chewing gum), keeping your hands and body busy,  calling family or friends, or contacting a quitline for people who want to quit smoking. You can deal with withdrawal symptoms by avoiding places where people smoke, getting plenty of rest, and avoiding drinks that contain caffeine. This information is not intended to replace advice given to you by your health care provider. Make sure you discuss any questions you have with your health care provider. Document Revised: 07/11/2021 Document Reviewed: 07/11/2021 Elsevier Patient Education  [place].

## 2022-09-16 NOTE — Progress Notes (Signed)
Cardiology Office Note   Date:  09/16/2022   ID:  Elizabeth Torres, DOB [birth year], MRN [id number]  PCP:  Elizabeth Patch, MD  Cardiologist:   Elizabeth Sacramento, MD   Chief Complaint  Patient presents with   New Patient (Initial Visit)    Ref by Dr. Berdine Addison for shortness of breath and lung CT showing Aortic Atherosclerosis. Medications reviewed by the patient verbally.       History of Present Illness: Elizabeth Torres is a 47 y.o. female who was referred by Dr. Genia Harold for evaluation of shortness of breath, chest pain and coronary/aortic atherosclerosis. She has known history of essential hypertension, hyperlipidemia, breast cancer status post lumpectomy followed by radiation and chemotherapy and tobacco use. She was recently evaluated in the pulmonary clinic for chest pain and shortness of breath.  She underwent CT scan of the chest which showed no findings of interstitial lung disease.  There was mild diffuse bronchial wall thickening suggestive of COPD.  In addition, she was noted to have aortic atherosclerosis as well as three-vessel coronary artery calcifications.  These images were personally reviewed by me and also reviewed with the patient. She reports chronic pleuritic chest pain over the last few years with occasional associated tightness in the lower side of the neck.  She also has chronic exertional dyspnea.  She smokes 1 pack/day and has been doing so for at least 20 years.   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   Personal history of chemotherapy 2013   for breast ca   Personal history of radiation therapy 2013   F/U right breast cancer    Pneumonia    Radiation 2013   BREAST CA   Status post chemotherapy 2013   BREAST CA    Past Surgical History:  Procedure Laterality Date   ANTERIOR CERVICAL  DECOMP/DISCECTOMY FUSION N/A 07/08/2022   Procedure: C4-7 ANTERIOR CERVICAL DISCECTOMY AND FUSION (GLOBUS HEDRON);  Surgeon: Meade Maw, MD;  Location: [facility];  Service: Neurosurgery;  Laterality: N/A;   BREAST BIOPSY Right 07/2011   invasive ductal carcinoma   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 RAd and chemo tx   BREAST SURGERY     mastectomy Right    partial with lymph node dissection   PORT A CATH REVISION     PORT-A-CATH REMOVAL  11-07-15   Dr Bary Castilla   SHOULDER ARTHROSCOPY WITH ROTATOR CUFF REPAIR Left 05/18/2019   Procedure: SHOULDER ARTHROSCOPY WITH SUBACROMIAL DECOMPRESSION AND DISTAL CLAVICAL EXCISION, INTRAARTICULAR DEBRIDEMENT;  Surgeon: Lovell Sheehan, MD;  Location: [facility];  Service: Orthopedics;  Laterality: Left;   SPINE SURGERY  2008   Dr Joya Salm   TUBAL LIGATION       Current Outpatient Medications  Medication Sig Dispense Refill   aspirin EC 81 MG tablet Take 1 tablet (81 mg total) by mouth daily. Swallow whole. 90 tablet 3   atorvastatin (LIPITOR) 20 MG tablet Take 1 tablet (20 mg total) by mouth daily. 90 tablet 3   azithromycin (ZITHROMAX) 250 MG tablet Take 2 tablets on day 1, then 1 tablet daily on days 2 through 5 6 tablet 0   hydrochlorothiazide (HYDRODIURIL) 12.5 MG tablet TAKE 1 TABLET(12.5 MG) BY MOUTH DAILY 90  tablet 1   HYDROcodone-acetaminophen (NORCO) 10-325 MG tablet Take 1 tablet by mouth 5 (five) times daily as needed. 150 tablet 0   methocarbamol (ROBAXIN) 500 MG tablet TAKE 1 TABLET(500 MG) BY MOUTH FOUR TIMES DAILY 120 tablet 0   minoxidil (LONITEN) 2.5 MG tablet Take 1.25 mg by mouth 2 (two) times daily.     nortriptyline (PAMELOR) 10 MG capsule TAKE 3 CAPSULES(30 MG) BY MOUTH AT BEDTIME (Patient taking differently: Take 40 mg by mouth at bedtime. TAKE 4 CAPSULES(40 MG) BY MOUTH AT BEDTIME) 270 capsule 1   promethazine-dextromethorphan (PROMETHAZINE-DM) 6.25-15 MG/5ML syrup Take  5 mLs by mouth 4 (four) times daily as needed for cough. 118 mL 0   Vitamin D, Ergocalciferol, (DRISDOL) 1.25 MG (50000 UNIT) CAPS capsule Take 50,000 Units by mouth once a week.     zolpidem (AMBIEN) 10 MG tablet Take 1 tablet (10 mg total) by mouth at bedtime as needed for sleep. 30 tablet 2   No current facility-administered medications for this visit.    Allergies:   Penicillins, Pregabalin, Sulfa antibiotics, Ondansetron, Other, and Zofran [ondansetron hcl]    Social History:  The patient  reports that she has been smoking cigarettes. She has a 20.00 pack-year smoking history. She has never used smokeless tobacco. She reports that she does not currently use alcohol. She reports that she does not use drugs.   Family History:  The patient's family history includes Breast cancer in her paternal aunt and paternal uncle; Cancer in her paternal uncle; Diabetes in her mother; Heart attack in her father; Heart disease in her father; Hypertension in her mother; Other in her father; Peripheral Artery Disease in her father; Pulmonary fibrosis in her mother.    ROS:  Please see the history of present illness.   Otherwise, review of systems are positive for none.   All other systems are reviewed and negative.    PHYSICAL EXAM: VS:  BP 100/64 (BP Location: Left Arm, Patient Position: Sitting, Cuff Size: Normal)   Pulse 98   Ht 5' 3"$  (1.6 m)   Wt 154 lb 6 oz (70 kg)   SpO2 97%   BMI 27.35 kg/m  , BMI Body mass index is 27.35 kg/m. GEN: Well nourished, well developed, in no acute distress  HEENT: normal  Neck: no JVD, carotid bruits, or masses Cardiac: RRR; no murmurs, rubs, or gallops,no edema  Respiratory:  clear to auscultation bilaterally, normal work of breathing GI: soft, nontender, nondistended, + BS MS: no deformity or atrophy  Skin: warm and dry, no rash Neuro:  Strength and sensation are intact Psych: euthymic mood, full affect   EKG:  EKG is ordered today. The ekg ordered  today demonstrates normal sinus rhythm with left atrial enlargement and nonspecific ST changes.   Recent Labs: 06/29/2022: BUN 11; Creatinine, Ser 0.65; Hemoglobin 14.3; Platelets 270; Potassium 4.3; Sodium 136    Lipid Panel    Component Value Date/Time   CHOL 181 01/20/2022 1109   TRIG 107 01/20/2022 1109   HDL 62 01/20/2022 1109   LDLCALC 100 (H) 01/20/2022 1109      Wt Readings from Last 3 Encounters:  09/16/22 154 lb 6 oz (70 kg)  09/15/22 154 lb (69.9 kg)  09/10/22 154 lb (69.9 kg)          09/16/2022    2:14 PM  PAD Screen  Previous PAD dx? No  Previous surgical procedure? No  Pain with walking? No  Feet/toe relief with dangling? No  Painful, non-healing ulcers? No  Extremities discolored? No      ASSESSMENT AND PLAN:  1.  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  Some of her chest pain and exertional dyspnea can be angina equivalent.  Thus, I recommend evaluation with a treadmill nuclear stress test. I asked her to start taking aspirin 81 mg once daily.  2.  Exertional dyspnea: I requested an echocardiogram to evaluate LV systolic/diastolic function.  3.  Hyperlipidemia: Even though her LDL was only 100, I do recommend treatment with a statin given the degree of aortic and coronary calcifications noted on CT scan.  I started atorvastatin 20 mg once daily and will try to get her LDL below 70.  4.  Tobacco use: I discussed with her the importance of smoking cessation.    Disposition:   Proceed with a treadmill nuclear stress test and an echocardiogram.  Follow-up in 2 months.  Signed,  Elizabeth Sacramento, MD  09/16/2022 2:44 PM    [place]

## 2022-09-24 ENCOUNTER — Ambulatory Visit
Admission: RE | Admit: 2022-09-24 | Discharge: 2022-09-24 | Disposition: A | Discharge status: Home / Self Care | Payer: BC Managed Care – PPO | Source: Ambulatory Visit | Attending: Cardiovascular Disease | Admitting: Cardiovascular Disease

## 2022-09-24 DIAGNOSIS — R072 Precordial pain: Secondary | ICD-10-CM | POA: Diagnosis not present

## 2022-09-24 MED ORDER — TECHNETIUM TC 99M TETROFOSMIN IV KIT
32.0400 | PACK | Freq: Once | INTRAVENOUS | Status: AC | PRN
  Administered 2022-09-24: 32.04 via INTRAVENOUS

## 2022-09-24 MED ORDER — TECHNETIUM TC 99M TETROFOSMIN IV KIT
10.2600 | PACK | Freq: Once | INTRAVENOUS | Status: AC | PRN
  Administered 2022-09-24: 10.26 via INTRAVENOUS

## 2022-09-25 LAB — NM MYOCAR MULTI W/SPECT W/WALL MOTION / EF
Angina Index: 2
Duke Treadmill Score: -3
Estimated workload: 7
Exercise duration (min): 5 min
Exercise duration (sec): 23 s
LV dias vol: 58 mL (ref 46–106)
LV sys vol: 19 mL
MPHR: 175 {beats}/min
Nuc Stress EF: 67 %
Peak HR: 157 {beats}/min
Percent HR: 89 %
Rest HR: 96 {beats}/min
Rest Nuclear Isotope Dose: 10.3 mCi
SDS: 0
SRS: 5
SSS: 0
ST Depression (mm): 0 mm
Stress Nuclear Isotope Dose: 32 mCi
TID: 1

## 2022-09-28 ENCOUNTER — Other Ambulatory Visit: Payer: Self-pay

## 2022-09-28 ENCOUNTER — Ambulatory Visit
Admission: RE | Admit: 2022-09-28 | Discharge: 2022-09-28 | Disposition: A | Discharge status: Home / Self Care | Payer: BC Managed Care – PPO | Source: Ambulatory Visit | Attending: Neurosurgery | Admitting: Neurosurgery

## 2022-09-28 ENCOUNTER — Ambulatory Visit
Admission: RE | Admit: 2022-09-28 | Discharge: 2022-09-28 | Disposition: A | Discharge status: Home / Self Care | Payer: BC Managed Care – PPO | Attending: Registered Nurse | Admitting: Registered Nurse

## 2022-09-28 ENCOUNTER — Ambulatory Visit
Admission: RE | Admit: 2022-09-28 | Discharge: 2022-09-28 | Disposition: A | Discharge status: Home / Self Care | Payer: BC Managed Care – PPO | Attending: Neurosurgery | Admitting: Neurosurgery

## 2022-09-28 DIAGNOSIS — Z981 Arthrodesis status: Secondary | ICD-10-CM | POA: Insufficient documentation

## 2022-09-28 DIAGNOSIS — M4322 Fusion of spine, cervical region: Secondary | ICD-10-CM | POA: Diagnosis not present

## 2022-09-29 ENCOUNTER — Ambulatory Visit: Payer: BC Managed Care – PPO | Attending: Family Medicine

## 2022-09-29 DIAGNOSIS — R0600 Dyspnea, unspecified: Secondary | ICD-10-CM | POA: Diagnosis not present

## 2022-09-29 DIAGNOSIS — F17210 Nicotine dependence, cigarettes, uncomplicated: Secondary | ICD-10-CM | POA: Insufficient documentation

## 2022-09-29 DIAGNOSIS — R0602 Shortness of breath: Secondary | ICD-10-CM | POA: Insufficient documentation

## 2022-09-29 LAB — PULMONARY FUNCTION TEST ARMC ONLY
DL/VA % pred: 76 %
DL/VA: 3.38 ml/min/mmHg/L
DLCO unc % pred: 73 %
DLCO unc: 15.25 ml/min/mmHg
FEF 25-75 Post: 2.76 L/sec
FEF 25-75 Pre: 1.58 L/sec
FEF2575-%Change-Post: 74 %
FEF2575-%Pred-Post: 95 %
FEF2575-%Pred-Pre: 54 %
FEV1-%Change-Post: 18 %
FEV1-%Pred-Post: 80 %
FEV1-%Pred-Pre: 67 %
FEV1-Post: 2.27 L
FEV1-Pre: 1.92 L
FEV1FVC-%Change-Post: 7 %
FEV1FVC-%Pred-Pre: 91 %
FEV6-%Change-Post: 10 %
FEV6-%Pred-Post: 83 %
FEV6-%Pred-Pre: 75 %
FEV6-Post: 2.85 L
FEV6-Pre: 2.58 L
FEV6FVC-%Change-Post: 0 %
FEV6FVC-%Pred-Post: 102 %
FEV6FVC-%Pred-Pre: 101 %
FVC-%Change-Post: 10 %
FVC-%Pred-Post: 81 %
FVC-%Pred-Pre: 73 %
FVC-Post: 2.85 L
FVC-Pre: 2.59 L
Post FEV1/FVC ratio: 80 %
Post FEV6/FVC ratio: 100 %
Pre FEV1/FVC ratio: 74 %
Pre FEV6/FVC Ratio: 100 %
RV % pred: 172 %
RV: 2.82 L
TLC % pred: 114 %
TLC: 5.63 L

## 2022-09-29 MED ORDER — ALBUTEROL SULFATE (2.5 MG/3ML) 0.083% IN NEBU
2.5000 mg | INHALATION_SOLUTION | Freq: Once | RESPIRATORY_TRACT | Status: AC
  Administered 2022-09-29: 2.5 mg via RESPIRATORY_TRACT
  Filled 2022-09-29: qty 3

## 2022-10-01 ENCOUNTER — Ambulatory Visit (INDEPENDENT_AMBULATORY_CARE_PROVIDER_SITE_OTHER): Payer: BC Managed Care – PPO | Admitting: Neurosurgery

## 2022-10-01 ENCOUNTER — Encounter: Payer: Self-pay | Admitting: Neurosurgery

## 2022-10-01 ENCOUNTER — Encounter: Payer: Self-pay | Admitting: *Deleted

## 2022-10-01 VITALS — BP 120/78 | Temp 98.0°F | Ht 63.0 in | Wt 154.0 lb

## 2022-10-01 DIAGNOSIS — R29898 Other symptoms and signs involving the musculoskeletal system: Secondary | ICD-10-CM

## 2022-10-01 DIAGNOSIS — Z09 Encounter for follow-up examination after completed treatment for conditions other than malignant neoplasm: Secondary | ICD-10-CM

## 2022-10-01 DIAGNOSIS — M5412 Radiculopathy, cervical region: Secondary | ICD-10-CM

## 2022-10-01 DIAGNOSIS — Z981 Arthrodesis status: Secondary | ICD-10-CM

## 2022-10-01 DIAGNOSIS — M4802 Spinal stenosis, cervical region: Secondary | ICD-10-CM

## 2022-10-01 DIAGNOSIS — T8130XA Disruption of wound, unspecified, initial encounter: Secondary | ICD-10-CM

## 2022-10-01 MED ORDER — CLINDAMYCIN HCL 300 MG PO CAPS: 300.0000 mg | ORAL_CAPSULE | Freq: Three times a day (TID) | ORAL | 0 refills | Status: AC

## 2022-10-01 NOTE — Progress Notes (Signed)
   REFERRING PHYSICIAN:  Juline Patch, Md [address] [place],  [place] [address]  DOS: 07/08/22  ACDF C4-C7  HISTORY OF PRESENT ILLNESS: Elizabeth Torres is status post ACDF C4-C7.  She is doing well.  Her arm pain is much improved.    She began having some intermittent wound drainage over the past 4 weeks.  She has no systemic symptoms.     PHYSICAL EXAMINATION:  NEUROLOGICAL:  General: In no acute distress.   Awake, alert, oriented to person, place, and time.  Pupils equal round and reactive to light.  Facial tone is symmetric.    Strength: Side Biceps Triceps Deltoid Interossei Grip Wrist Ext. Wrist Flex.  R 5 5 5 5 5 5 5  $ L 5 5 5 5 5 5 5   $ Incision c/d/I with small amount of scabbing  Imaging:  No complications noted  Assessment / Plan: Elizabeth Torres is doing well s/p above surgery.    She has had intermittent drainage from her incision.  It is not evidently infected at this point, but I would like to try a short course of antibiotics to see if this will allow the wound to fully heal.  We did discuss that I could reopen the wound and washed it out, but she elected against that at this point.    Meade Maw MD Dept of Neurosurgery

## 2022-10-02 ENCOUNTER — Encounter: Payer: Self-pay | Admitting: *Deleted

## 2022-10-15 ENCOUNTER — Encounter: Payer: BC Managed Care – PPO | Attending: Physical Medicine and Rehabilitation | Admitting: Registered Nurse

## 2022-10-15 ENCOUNTER — Encounter: Payer: Self-pay | Admitting: Registered Nurse

## 2022-10-15 VITALS — BP 106/70 | HR 100 | Ht 63.0 in | Wt 153.0 lb

## 2022-10-15 DIAGNOSIS — M5416 Radiculopathy, lumbar region: Secondary | ICD-10-CM | POA: Diagnosis not present

## 2022-10-15 DIAGNOSIS — Z5181 Encounter for therapeutic drug level monitoring: Secondary | ICD-10-CM | POA: Diagnosis not present

## 2022-10-15 DIAGNOSIS — G894 Chronic pain syndrome: Secondary | ICD-10-CM

## 2022-10-15 DIAGNOSIS — T451X5A Adverse effect of antineoplastic and immunosuppressive drugs, initial encounter: Secondary | ICD-10-CM | POA: Diagnosis not present

## 2022-10-15 DIAGNOSIS — Z79891 Long term (current) use of opiate analgesic: Secondary | ICD-10-CM | POA: Diagnosis not present

## 2022-10-15 DIAGNOSIS — G620 Drug-induced polyneuropathy: Secondary | ICD-10-CM | POA: Diagnosis not present

## 2022-10-15 MED ORDER — HYDROCODONE-ACETAMINOPHEN 10-325 MG PO TABS: 1.0000 | ORAL_TABLET | Freq: Every day | ORAL | 0 refills | Status: DC | PRN

## 2022-10-15 NOTE — Progress Notes (Signed)
Subjective:    Patient ID: Elizabeth Torres, female    DOB: [birth year], 47 y.o.   MRN: [id number]  HPI: Elizabeth Torres is a 47 y.o. female who returns for follow up appointment for chronic pain and medication refill. She states her pain is located in her lower back pain radiating into her bilateral lower extremities. She rates her pain 4. Her current exercise regime is walking and performing stretching exercises.   Ms. Capistran Morphine equivalent is 50.00 MME.   UDS ordered today.   Pain Inventory Average Pain 4 Pain Right Now 4 My pain is constant and aching  In the last 24 hours, has pain interfered with the following? General activity 3 Relation with others 3 Enjoyment of life 3 What TIME of day is your pain at its worst? daytime Sleep (in general) Fair  Pain is worse with: standing and some activites Pain improves with: rest and medication Relief from Meds: 7  Family History  Problem Relation Age of Onset   Diabetes Mother    Hypertension Mother    Pulmonary fibrosis Mother    Heart disease Father    Heart attack Father    Other Father        unknown medical history   Peripheral Artery Disease Father    Breast cancer Paternal Aunt        3'S   Cancer Paternal Uncle    Breast cancer Paternal Uncle        62'S   Social History   Socioeconomic History   Marital status: Married    Spouse name: Danuta Esfahani   Number of children: 3   Years of education: Not on file   Highest education level: Not on file  Occupational History   Not on file  Tobacco Use   Smoking status: Every Day    Packs/day: 1.00    Years: 20.00    Additional pack years: 0.00    Total pack years: 20.00    Types: Cigarettes   Smokeless tobacco: Never   Tobacco comments:    0.75PPD 08/31/2022  Vaping Use   Vaping Use: Never used  Substance and Sexual Activity   Alcohol use: Not Currently   Drug use: Never   Sexual activity: Yes    Partners: Male  Other Topics Concern   Not on file  Social  History Narrative   Not on file   Social Determinants of Health   Financial Resource Strain: Not on file  Food Insecurity: No Food Insecurity (07/14/2022)   Hunger Vital Sign    Worried About Running Out of Food in the Last Year: Never true    Ran Out of Food in the Last Year: Never true  Transportation Needs: No Transportation Needs (07/14/2022)   PRAPARE - Hydrologist (Medical): No    Lack of Transportation (Non-Medical): No  Physical Activity: Not on file  Stress: Not on file  Social Connections: Not on file   Past Surgical History:  Procedure Laterality Date   ANTERIOR CERVICAL DECOMP/DISCECTOMY FUSION N/A 07/08/2022   Procedure: C4-7 ANTERIOR CERVICAL DISCECTOMY AND FUSION (GLOBUS HEDRON);  Surgeon: Meade Maw, MD;  Location: [facility];  Service: Neurosurgery;  Laterality: N/A;   BREAST BIOPSY Right 07/2011   invasive ductal carcinoma   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 RAd and chemo tx   BREAST SURGERY     mastectomy Right    partial with lymph  node dissection   PORT A CATH REVISION     PORT-A-CATH REMOVAL  11-07-15   Dr Bary Castilla   SHOULDER ARTHROSCOPY WITH ROTATOR CUFF REPAIR Left 05/18/2019   Procedure: SHOULDER ARTHROSCOPY WITH SUBACROMIAL DECOMPRESSION AND DISTAL CLAVICAL EXCISION, INTRAARTICULAR DEBRIDEMENT;  Surgeon: Lovell Sheehan, MD;  Location: [facility];  Service: Orthopedics;  Laterality: Left;   SPINE SURGERY  2008   Dr Joya Salm   TUBAL LIGATION     Past Surgical History:  Procedure Laterality Date   ANTERIOR CERVICAL DECOMP/DISCECTOMY FUSION N/A 07/08/2022   Procedure: C4-7 ANTERIOR CERVICAL DISCECTOMY AND FUSION (GLOBUS HEDRON);  Surgeon: Meade Maw, MD;  Location: [facility];  Service: Neurosurgery;  Laterality: N/A;   BREAST BIOPSY Right 07/2011   invasive ductal carcinoma   BREAST LUMPECTOMY Right 08/2011   invasive ductal carcinoma and DCIS. Margins  clear. RAd and chemo tx   BREAST SURGERY     mastectomy Right    partial with lymph node dissection   PORT A CATH REVISION     PORT-A-CATH REMOVAL  11-07-15   Dr Bary Castilla   SHOULDER ARTHROSCOPY WITH ROTATOR CUFF REPAIR Left 05/18/2019   Procedure: SHOULDER ARTHROSCOPY WITH SUBACROMIAL DECOMPRESSION AND DISTAL CLAVICAL EXCISION, INTRAARTICULAR DEBRIDEMENT;  Surgeon: Lovell Sheehan, MD;  Location: [facility];  Service: Orthopedics;  Laterality: Left;   SPINE SURGERY  2008   Dr Joya Salm   TUBAL LIGATION     Past Medical History:  Diagnosis Date   BRCA negative    Breast cancer ([place]) 2013   RT LUMPECTOMY with chemo and rad tx   Degenerative disc disease, lumbar    Hypertension    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   Personal history of chemotherapy 2013   for breast ca   Personal history of radiation therapy 2013   F/U right breast cancer    Pneumonia    Radiation 2013   BREAST CA   Status post chemotherapy 2013   BREAST CA   LMP 09/28/2022   Opioid Risk Score:   Fall Risk Score:  `1  Depression screen [facility] 2/9     09/10/2022    9:14 AM 08/18/2022    9:27 AM 06/16/2022   10:32 AM 05/06/2022    9:53 AM 03/26/2022    8:06 AM 03/17/2022   10:02 AM 02/10/2022    9:54 AM  Depression screen [facility] 2/9  Decreased Interest 0 0 0 0 0 0 0  Down, Depressed, Hopeless 0 0 0 0 0 0 0  PHQ - 2 Score 0 0 0 0 0 0 0    Review of Systems  Musculoskeletal:  Positive for back pain.       Pain in both legs  All other systems reviewed and are negative.      Objective:   Physical Exam Vitals and nursing note reviewed.  Constitutional:      Appearance: Normal appearance.  Cardiovascular:     Rate and Rhythm: Normal rate and regular rhythm.     Pulses: Normal pulses.     Heart sounds: Normal heart sounds.  Pulmonary:     Effort: Pulmonary effort is normal.     Breath sounds: Normal breath sounds.  Musculoskeletal:     Cervical back: Normal range of motion and neck supple.      Comments: Normal Muscle Bulk and Muscle Testing Reveals:  Upper Extremities: Full ROM and Muscle Strength 5/5 Lumbar Paraspinal Tenderness: L-4-L-5 Lower Extremities: Full ROM and Muscle Strength 5/5 Arises from Chair  with ease Narrow Based  Gait     Skin:    General: Skin is warm and dry.  Neurological:     Mental Status: She is alert and oriented to person, place, and time.  Psychiatric:        Mood and Affect: Mood normal.        Behavior: Behavior normal.         Assessment & Plan:  1. Lumbar postlaminectomy syndrome status post L5-S1 fusion with chronic S1 radiculopathy. Continue current medication regimen with Nortriptyline. 10/15/2022. Refilled :  Hydrocodone 10/325 mg #145-one tablet 5 times a day as needed for pain #150 10/15/2022. We will continue the opioid monitoring program, this consists of regular clinic visits, examinations, urine drug screen, pill counts as well as use of [place] Controlled Substance Reporting system. A 12 month History has been reviewed on the [place] on 10/15/2022. 2. 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 10/15/2022 3. Insomnia: Continue current medication regimen Ambien. 10/15/2022  4.Chronic Left Shoulder Pain: No complaints today: S/P Left Shoulder Arthroscopy with Rotator Cuff Repair on 05/18/2019 by Dr. Gunnar Fusi : Ortho Following. 10/15/2022 5. Cervicalgia/ Cervical Radiculitis: Continue Pamelor.S/P on 07/08/2022. with Dr Izora Ribas:  C4-7 ANTERIOR CERVICAL DISCECTOMY AND FUSION (GLOBUS HEDRON) N/A General   We will continue to monitor.  10/15/2022   F/U in 1 month.

## 2022-10-21 LAB — TOXASSURE SELECT,+ANTIDEPR,UR

## 2022-10-28 ENCOUNTER — Encounter: Payer: Self-pay | Admitting: Family Medicine

## 2022-10-29 ENCOUNTER — Encounter: Payer: Self-pay | Admitting: Family Medicine

## 2022-10-29 ENCOUNTER — Ambulatory Visit (INDEPENDENT_AMBULATORY_CARE_PROVIDER_SITE_OTHER): Payer: BC Managed Care – PPO | Admitting: Family Medicine

## 2022-10-29 VITALS — BP 122/74 | HR 98 | Temp 97.9°F | Ht 63.0 in | Wt 157.0 lb

## 2022-10-29 DIAGNOSIS — J0100 Acute maxillary sinusitis, unspecified: Secondary | ICD-10-CM

## 2022-10-29 MED ORDER — AZITHROMYCIN 250 MG PO TABS: ORAL_TABLET | ORAL | 0 refills | Status: DC

## 2022-10-29 NOTE — Progress Notes (Signed)
Date:  10/29/2022   Name:  Elizabeth Torres   DOB:  11-15-1975   MRN:  [id number]   Chief Complaint: Sinusitis (Sinus headache, cheek pressure since Monday)  Sinusitis This is a new problem. The current episode started in the past 7 days (Monday). The problem has been gradually improving since onset. There has been no fever. Her pain is at a severity of 2/10. The pain is mild. Associated symptoms include congestion, headaches, a hoarse voice, sinus pressure and swollen glands. Pertinent negatives include no chills, coughing, diaphoresis, ear pain, neck pain, shortness of breath, sneezing or sore throat. Past treatments include oral decongestants. The treatment provided mild relief.    Lab Results  Component Value Date   NA 136 06/29/2022   K 4.3 06/29/2022   CO2 29 06/29/2022   GLUCOSE 93 06/29/2022   BUN 11 06/29/2022   CREATININE 0.65 06/29/2022   CALCIUM 9.5 06/29/2022   EGFR 107 01/20/2022   GFRNONAA >60 06/29/2022   Lab Results  Component Value Date   CHOL 181 01/20/2022   HDL 62 01/20/2022   LDLCALC 100 (H) 01/20/2022   TRIG 107 01/20/2022   Lab Results  Component Value Date   TSH 2.360 07/22/2021   No results found for: "HGBA1C" Lab Results  Component Value Date   WBC 7.3 06/29/2022   HGB 14.3 06/29/2022   HCT 41.5 06/29/2022   MCV 91.4 06/29/2022   PLT 270 06/29/2022   Lab Results  Component Value Date   ALT 26 01/26/2020   AST 23 01/26/2020   ALKPHOS 57 01/26/2020   BILITOT 0.3 01/26/2020   No results found for: "25OHVITD2", "25OHVITD3", "VD25OH"   Review of Systems  Constitutional:  Negative for chills, diaphoresis and fever.  HENT:  Positive for congestion, hoarse voice, postnasal drip, rhinorrhea, sinus pressure and sinus pain. Negative for ear pain, nosebleeds, sneezing and sore throat.   Eyes:  Negative for visual disturbance.  Respiratory:  Negative for cough, chest tightness, shortness of breath and wheezing.   Cardiovascular:  Negative for  chest pain and palpitations.  Gastrointestinal:  Negative for abdominal pain.  Musculoskeletal:  Negative for neck pain.  Neurological:  Positive for headaches.    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   Right arm weakness 07/08/2022   Cervical radiculopathy 07/08/2022   S/P cervical spinal fusion 07/08/2022   Cervical radicular pain 07/24/2021   Spinal stenosis in cervical region 07/24/2021   Chronic pain syndrome 07/24/2021   Cervical spondylosis with radiculopathy 02/25/2021   Mass of soft tissue of upper arm 01/31/2021   Elevated blood-pressure reading, without diagnosis of hypertension 08/26/2020   Menorrhagia with irregular cycle 04/02/2020   Rotator cuff impingement syndrome, left 12/15/2018   Cervical myofascial pain syndrome 02/14/2016   History of breast cancer 11/07/2015   Postlaminectomy syndrome, lumbar region 04/12/2012   Chemotherapy-induced neuropathy ([place]) 04/12/2012   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 ([place]) 08/07/2011    Allergies  Allergen Reactions   Penicillins Anaphylaxis   Pregabalin Other (See Comments)    Thoughts of self harm   Sulfa Antibiotics Anaphylaxis   Ondansetron     migraines   Other    Zofran [Ondansetron Hcl]     migraines    Past Surgical History:  Procedure Laterality Date   ANTERIOR CERVICAL DECOMP/DISCECTOMY FUSION N/A 07/08/2022   Procedure: C4-7 ANTERIOR CERVICAL DISCECTOMY AND FUSION (GLOBUS HEDRON);  Surgeon: Meade Maw, MD;  Location: [facility];  Service: Neurosurgery;  Laterality: N/A;   BREAST BIOPSY Right 07/2011   invasive ductal carcinoma   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 RAd and chemo tx   BREAST SURGERY     mastectomy Right    partial with lymph node dissection   PORT A CATH REVISION     PORT-A-CATH REMOVAL  11-07-15   Dr Bary Castilla   SHOULDER ARTHROSCOPY WITH  ROTATOR CUFF REPAIR Left 05/18/2019   Procedure: SHOULDER ARTHROSCOPY WITH SUBACROMIAL DECOMPRESSION AND DISTAL CLAVICAL EXCISION, INTRAARTICULAR DEBRIDEMENT;  Surgeon: Lovell Sheehan, MD;  Location: [place];  Service: Orthopedics;  Laterality: Left;   SPINE SURGERY  2008   Dr Joya Salm   TUBAL LIGATION      Social History   Tobacco Use   Smoking status: Every Day    Packs/day: 1.00    Years: 20.00    Additional pack years: 0.00    Total pack years: 20.00    Types: Cigarettes   Smokeless tobacco: Never   Tobacco comments:    0.75PPD 08/31/2022  Vaping Use   Vaping Use: Never used  Substance Use Topics   Alcohol use: Not Currently   Drug use: Never     Medication list has been reviewed and updated.  Current Meds  Medication Sig   aspirin EC 81 MG tablet Take 1 tablet (81 mg total) by mouth daily. Swallow whole.   atorvastatin (LIPITOR) 20 MG tablet Take 1 tablet (20 mg total) by mouth daily.   hydrochlorothiazide (HYDRODIURIL) 12.5 MG tablet TAKE 1 TABLET(12.5 MG) BY MOUTH DAILY   HYDROcodone-acetaminophen (NORCO) 10-325 MG tablet Take 1 tablet by mouth 5 (five) times daily as needed.   methocarbamol (ROBAXIN) 500 MG tablet TAKE 1 TABLET(500 MG) BY MOUTH FOUR TIMES DAILY   minoxidil (LONITEN) 2.5 MG tablet Take 1.25 mg by mouth 2 (two) times daily.   nortriptyline (PAMELOR) 10 MG capsule TAKE 3 CAPSULES(30 MG) BY MOUTH AT BEDTIME (Patient taking differently: Take 40 mg by mouth at bedtime. TAKE 4 CAPSULES(40 MG) BY MOUTH AT BEDTIME)   Vitamin D, Ergocalciferol, (DRISDOL) 1.25 MG (50000 UNIT) CAPS capsule Take 50,000 Units by mouth once a week.   zolpidem (AMBIEN) 10 MG tablet Take 1 tablet (10 mg total) by mouth at bedtime as needed for sleep.       10/29/2022    7:57 AM 01/20/2022   10:06 AM 12/17/2021   10:43 AM 07/22/2021    8:00 AM  GAD 7 : Generalized Anxiety Score  Nervous, Anxious, on Edge 0 0 0 0  Control/stop worrying 0 0 0 0  Worry too much -  different things 0 2 1 0  Trouble relaxing 0 0 0 0  Restless 0 0 0 0  Easily annoyed or irritable 0 1 0 0  Afraid - awful might happen 0 1 1 0  Total GAD 7 Score 0 4 2 0  Anxiety Difficulty Not difficult at all Not difficult at all Not difficult at all        10/29/2022    7:56 AM 10/15/2022    8:46 AM 09/10/2022    9:14 AM  Depression screen PHQ 2/9  Decreased Interest 0 0 0  Down, Depressed, Hopeless 0 0 0  PHQ - 2 Score 0 0 0  Altered sleeping 0    Tired, decreased energy 0    Change in appetite 0  Feeling bad or failure about yourself  0    Trouble concentrating 0    Moving slowly or fidgety/restless 0    Suicidal thoughts 0    PHQ-9 Score 0    Difficult doing work/chores Not difficult at all      BP Readings from Last 3 Encounters:  10/29/22 122/74  10/15/22 106/70  10/01/22 120/78    Physical Exam Vitals and nursing note reviewed. Exam conducted with a chaperone present.  Constitutional:      General: She is not in acute distress.    Appearance: She is not diaphoretic.  HENT:     Head: Normocephalic and atraumatic.     Right Ear: Tympanic membrane and external ear normal.     Left Ear: Tympanic membrane and external ear normal.     Nose:     Right Turbinates: Swollen.     Left Turbinates: Swollen.     Right Sinus: Maxillary sinus tenderness and frontal sinus tenderness present.     Left Sinus: Frontal sinus tenderness present.     Mouth/Throat:     Mouth: Mucous membranes are moist.  Eyes:     General:        Right eye: No discharge.        Left eye: No discharge.     Conjunctiva/sclera: Conjunctivae normal.     Pupils: Pupils are equal, round, and reactive to light.  Neck:     Thyroid: No thyromegaly.     Vascular: No JVD.  Cardiovascular:     Rate and Rhythm: Normal rate and regular rhythm.     Heart sounds: Normal heart sounds. No murmur heard.    No friction rub. No gallop.  Pulmonary:     Effort: Pulmonary effort is normal.     Breath sounds:  Normal breath sounds. No wheezing or rhonchi.  Abdominal:     General: Bowel sounds are normal.     Palpations: Abdomen is soft. There is no mass.     Tenderness: There is no abdominal tenderness. There is no guarding.  Musculoskeletal:        General: Normal range of motion.     Cervical back: Normal range of motion and neck supple.  Lymphadenopathy:     Cervical: No cervical adenopathy.  Skin:    General: Skin is warm and dry.  Neurological:     Mental Status: She is alert.     Wt Readings from Last 3 Encounters:  10/29/22 157 lb (71.2 kg)  10/15/22 153 lb (69.4 kg)  10/01/22 154 lb (69.9 kg)    BP 122/74   Pulse 98   Temp 97.9 F (36.6 C) (Oral)   Ht 5\' 3"  (1.6 m)   Wt 157 lb (71.2 kg)   SpO2 94%   BMI 27.81 kg/m   Assessment and Plan:  1. Acute maxillary sinusitis, recurrence not specified New onset.  Episodic.  Stable.  History of discomfort over the maxillary and frontal and discomfort behind the eyes consistent with sinusitis as is tenderness over the maxillary and frontal sinus areas.  Will initiate azithromycin to 50 mg 2 today followed by 1 a day for 4 days.  Will recheck as needed.  I have also suggested that the patient just Mucinex DM for mucolytic activity and mild suppression of cough. - azithromycin (ZITHROMAX) 250 MG tablet; Take 2 tablets on day 1, then 1 tablet daily on days 2 through 5  Dispense: 6 tablet; Refill: 0    Otilio Miu,  MD

## 2022-10-31 ENCOUNTER — Emergency Department: Payer: BC Managed Care – PPO

## 2022-10-31 ENCOUNTER — Other Ambulatory Visit: Payer: Self-pay

## 2022-10-31 ENCOUNTER — Emergency Department
Admission: EM | Admit: 2022-10-31 | Discharge: 2022-10-31 | Disposition: A | Discharge status: Home / Self Care | Payer: BC Managed Care – PPO | Attending: Emergency Medicine | Admitting: Emergency Medicine

## 2022-10-31 DIAGNOSIS — R519 Headache, unspecified: Secondary | ICD-10-CM | POA: Diagnosis not present

## 2022-10-31 DIAGNOSIS — J0100 Acute maxillary sinusitis, unspecified: Secondary | ICD-10-CM | POA: Insufficient documentation

## 2022-10-31 MED ORDER — TRIAMCINOLONE ACETONIDE 55 MCG/ACT NA AERO: 1.0000 | INHALATION_SPRAY | Freq: Two times a day (BID) | NASAL | 2 refills | Status: DC

## 2022-10-31 MED ORDER — DOXYCYCLINE HYCLATE 100 MG PO TABS
100.0000 mg | ORAL_TABLET | Freq: Once | ORAL | Status: AC
  Administered 2022-10-31: 100 mg via ORAL
  Filled 2022-10-31: qty 1

## 2022-10-31 MED ORDER — OXYMETAZOLINE HCL 0.05 % NA SOLN: 2.0000 | Freq: Two times a day (BID) | NASAL | 0 refills | Status: DC

## 2022-10-31 MED ORDER — DIPHENHYDRAMINE HCL 50 MG/ML IJ SOLN
50.0000 mg | Freq: Once | INTRAMUSCULAR | Status: AC
  Administered 2022-10-31: 50 mg via INTRAMUSCULAR
  Filled 2022-10-31: qty 1

## 2022-10-31 MED ORDER — PROMETHAZINE HCL 25 MG/ML IJ SOLN: 25.0000 mg | Freq: Four times a day (QID) | INTRAMUSCULAR | Status: DC | PRN

## 2022-10-31 MED ORDER — PREDNISONE 50 MG PO TABS: 50.0000 mg | ORAL_TABLET | Freq: Every day | ORAL | 0 refills | Status: DC

## 2022-10-31 MED ORDER — BUTALBITAL-APAP-CAFFEINE 50-325-40 MG PO TABS: 1.0000 | ORAL_TABLET | Freq: Four times a day (QID) | ORAL | 0 refills | Status: DC | PRN

## 2022-10-31 MED ORDER — LEVOCETIRIZINE DIHYDROCHLORIDE 5 MG PO TABS: 5.0000 mg | ORAL_TABLET | Freq: Every evening | ORAL | 0 refills | Status: DC

## 2022-10-31 MED ORDER — DOXYCYCLINE HYCLATE 100 MG PO TABS: 100.0000 mg | ORAL_TABLET | Freq: Two times a day (BID) | ORAL | 0 refills | Status: DC

## 2022-10-31 MED ORDER — KETOROLAC TROMETHAMINE 30 MG/ML IJ SOLN
30.0000 mg | Freq: Once | INTRAMUSCULAR | Status: AC
  Administered 2022-10-31: 30 mg via INTRAMUSCULAR
  Filled 2022-10-31: qty 1

## 2022-10-31 NOTE — ED Triage Notes (Signed)
Pt to ED from home for headache x 6 days. Pt was seen Wednesday for a sinus infection and was prescribed antibiotics. Pt is still taking those. Pt has taken all OTC meds she can with no relief. Pt did take a friends Ajovy for migraine which is a SQ injection as well. Pt is CAOx4 and in no acute distress and ambulatory in triage.

## 2022-10-31 NOTE — Discharge Instructions (Addendum)
Take the antibiotic, Nasacort, Xyzal. No more than 3 days of afrin. On 11/04/22 start prednisone. Call your pain management doctor before filling the fioricet headache medicine

## 2022-10-31 NOTE — ED Notes (Signed)
Pt declined DC VS, "I'm good."

## 2022-10-31 NOTE — ED Provider Notes (Signed)
[facility] Provider Note  Patient Contact: 8:03 PM (approximate)   History   Headache (X 6 days)   HPI  Elizabeth Torres is a 47 y.o. female who presents emergency department complaining of right-sided facial pain, headache.  Patient states that she has a history of allergies, felt like she was developing a sinus infection called her primary care.  She was placed on azithromycin for sinus infection, has been using multiple over-the-counter medications without relief.  She developed a headache 6 days ago that has been persistent in the frontal and right temporal region.  Patient states that she has no headache history, this headache has not resolved with over-the-counter medication, as well as her friend's prescription migraine medication.  No emesis.  No slurred speech.  No facial droop.  No unilateral weakness.     Physical Exam   Triage Vital Signs: ED Triage Vitals  Enc Vitals Group     BP 10/31/22 1912 (!) 118/93     Pulse Rate 10/31/22 1912 (!) 115     Resp 10/31/22 1912 16     Temp 10/31/22 1912 98.5 F (36.9 C)     Temp Source 10/31/22 1912 Oral     SpO2 10/31/22 1912 99 %     Weight 10/31/22 1912 150 lb (68 kg)     Height 10/31/22 1912 5\' 3"  (1.6 m)     Head Circumference --      Peak Flow --      Pain Score 10/31/22 1916 10     Pain Loc --      Pain Edu? --      Excl. in [place]? --     Most recent vital signs: Vitals:   10/31/22 1912  BP: (!) 118/93  Pulse: (!) 115  Resp: 16  Temp: 98.5 F (36.9 C)  SpO2: 99%     General: Alert and in no acute distress. Eyes:  PERRL. EOMI. ENT:      Ears:       Nose: No congestion/rhinnorhea.  Turbinates are injected and bulging.  Tender to percussion over the right frontal and maxillary sinus.      Mouth/Throat: Mucous membranes are moist. Neck: No stridor. No cervical spine tenderness to palpation.  Cardiovascular:  Good peripheral perfusion Respiratory: Normal respiratory effort without  tachypnea or retractions. Lungs CTAB. Musculoskeletal: Full range of motion to all extremities.  Neurologic:  No gross focal neurologic deficits are appreciated.  Cranial nerves II through XII grossly intact. Skin:   No rash noted Other:   ED Results / Procedures / Treatments   Labs (all labs ordered are listed, but only abnormal results are displayed) Labs Reviewed  COMPREHENSIVE METABOLIC PANEL  CBC WITH DIFFERENTIAL/PLATELET  PROTIME-INR     EKG     RADIOLOGY  I personally viewed, evaluated, and interpreted these images as part of my medical decision making, as well as reviewing the written report by the radiologist.  ED Provider Interpretation: No acute intracranial abnormality.  Extensive sinus disease identified.  CT Head Wo Contrast  Result Date: 10/31/2022 CLINICAL DATA:  Headache, sudden, severe New onset headache 6 days ago. No headache history. Sharp right-sided headache persisting despite medication use. EXAM: CT HEAD WITHOUT CONTRAST TECHNIQUE: Contiguous axial images were obtained from the base of the skull through the vertex without intravenous contrast. RADIATION DOSE REDUCTION: This exam was performed according to the departmental dose-optimization program which includes automated exposure control, adjustment of the mA and/or kV according to  patient size and/or use of iterative reconstruction technique. 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 No acute intracranial hemorrhage or infarct. No abnormal mass effect or midline shift. No abnormal intra or extra-axial mass lesion. Ventricular size is normal. Cerebellum is unremarkable. Vascular: No hyperdense vessel or unexpected calcification. Skull: Normal. Negative for fracture or focal lesion. Sinuses/Orbits: There is complete opacification of the right frontal and visualized right maxillary sinus as well  as numerous right ethmoid air cells. Air-fluid level noted within the visualized left maxillary sinus and layering mucus within the left sphenoid sinus. The medial wall of the right maxillary antrum may be eroded or partially resected this is incompletely evaluated on this examination. Orbits are unremarkable. Other: There is fluid opacification of several right mastoid air cells without superimposed osseous erosion. Middle ear cavities and left mastoid air cells are clear. IMPRESSION: 1. No acute intracranial abnormality. 2. 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 3. Extensive paranasal sinus disease with complete opacification of the right frontal and visualized right maxillary sinus as well as numerous right ethmoid air cells. The medial wall of the right maxillary antrum may be eroded or partially resected. This is incompletely evaluated on this examination. Correlation with direct visualization is recommended for further evaluation. 4. Right mastoid effusion. Electronically Signed   By: Fidela Salisbury M.D.   On: 10/31/2022 20:58    PROCEDURES:  Critical Care performed: No  Procedures   MEDICATIONS ORDERED IN ED: Medications  doxycycline (VIBRA-TABS) tablet 100 mg (has no administration in time range)  ketorolac (TORADOL) 30 MG/ML injection 30 mg (has no administration in time range)  promethazine (PHENERGAN) injection 25 mg (has no administration in time range)  diphenhydrAMINE (BENADRYL) injection 50 mg (has no administration in time range)     IMPRESSION / MDM / ASSESSMENT AND PLAN / ED COURSE  I reviewed the triage vital signs and the nursing notes.                                 Differential diagnosis includes, but is not limited to, CVA, intracranial hemorrhage, mass, sinusitis  Patient's presentation is most consistent with acute presentation with potential threat to life or bodily function.   Patient's diagnosis is consistent with sinusitis, headache.   Patient presents emergency department with sinus congestion, sinus pressure and headache.  Patient has chronic allergic rhinitis, felt like she developed a sinus infection.  Was placed on Z-Pak with no relief.  Patient had a headache for 6 days.  No history of headache syndromes and as such imaging was obtained to ensure no other causative reasons other than sinus issues for the patient's headache.  Patient has extensive sinus disease on the right side consistent with physical exam with no evidence of intracranial abnormality.  Patient be given migraine cocktail just for mild symptom control even though most of her pain is more sinus related.  I will change the antibiotic for the patient, placed on multiple medications for sinus relief and decongestion.  Follow-up with primary care as needed.  Return precautions discussed with the patient..  Patient is given ED precautions to return to the ED for any worsening or new symptoms.     FINAL CLINICAL IMPRESSION(S) / ED DIAGNOSES   Final diagnoses:  Acute maxillary sinusitis, recurrence not specified  Sinus headache  Rx / DC Orders   ED Discharge Orders          Ordered    doxycycline (VIBRA-TABS) 100 MG tablet  2 times daily        10/31/22 2205    triamcinolone (NASACORT) 55 MCG/ACT AERO nasal inhaler  2 times daily        10/31/22 2205    oxymetazoline (AFRIN) 0.05 % nasal spray  2 times daily        10/31/22 2205    levocetirizine (XYZAL ALLERGY 24HR) 5 MG tablet  Every evening        10/31/22 2205    butalbital-acetaminophen-caffeine (FIORICET) 50-325-40 MG tablet  Every 6 hours PRN        10/31/22 2205    predniSONE (DELTASONE) 50 MG tablet  Daily with breakfast        10/31/22 2206             Note:  This document was prepared using Dragon voice recognition software and may include unintentional dictation errors.   Brynda Peon 10/31/22 2207    Blake Divine, MD 11/01/22 1126

## 2022-11-02 ENCOUNTER — Telehealth: Payer: Self-pay

## 2022-11-02 NOTE — Transitions of Care (Post Inpatient/ED Visit) (Signed)
   11/02/2022  Name: Elizabeth Torres MRN: [id number] DOB: Aug 31, 1975  Today's TOC FU Call Status: Today's TOC FU Call Status:: Unsuccessul Call (1st Attempt) Unsuccessful Call (1st Attempt) Date: 11/02/22  Attempted to reach the patient regarding the most recent Inpatient/ED visit.  Follow Up Plan: Additional outreach attempts will be made to reach the patient to complete the Transitions of Care (Post Inpatient/ED visit) call.   Signature Berton Mount

## 2022-11-02 NOTE — Transitions of Care (Post Inpatient/ED Visit) (Signed)
   11/02/2022  Name: ZEYA VICENTI MRN: [id number] DOB: [birth year]  Today's TOC FU Call Status: Today's TOC FU Call Status:: Successful TOC FU Call Competed TOC FU Call Complete Date: 11/02/22  Transition Care Management Follow-up Telephone Call Date of Discharge: 10/31/22 Discharge Facility: [facility]) Type of Discharge: Emergency Department Reason for ED Visit: Other: (sinuses/ headache) How have you been since you were released from the hospital?: Same Any questions or concerns?: No  Items Reviewed: Did you receive and understand the discharge instructions provided?: Yes Medications obtained and verified?: Yes (Medications Reviewed) Any new allergies since your discharge?: No Dietary orders reviewed?: NA Do you have support at home?: Yes People in Home: spouse Name of Support/Comfort Primary Source: [facility] and Equipment/Supplies: [place] Ordered?: NA Any new equipment or medical supplies ordered?: NA  Functional Questionnaire: Do you need assistance with bathing/showering or dressing?: No Do you need assistance with meal preparation?: No Do you need assistance with eating?: No Do you have difficulty maintaining continence: No Do you need assistance with getting out of bed/getting out of a chair/moving?: No Do you have difficulty managing or taking your medications?: No  Follow up appointments reviewed: PCP Follow-up appointment confirmed?: Yes Date of PCP follow-up appointment?: 11/06/22 Follow-up Provider: [facility] Follow-up appointment confirmed?: NA Do you need transportation to your follow-up appointment?: No Do you understand care options if your condition(s) worsen?: Yes-patient verbalized understanding    SIGNATURE Berton Mount

## 2022-11-03 ENCOUNTER — Ambulatory Visit: Payer: BC Managed Care – PPO

## 2022-11-06 ENCOUNTER — Telehealth: Payer: Self-pay | Admitting: *Deleted

## 2022-11-06 ENCOUNTER — Ambulatory Visit (INDEPENDENT_AMBULATORY_CARE_PROVIDER_SITE_OTHER): Payer: BC Managed Care – PPO | Admitting: Family Medicine

## 2022-11-06 ENCOUNTER — Encounter: Payer: Self-pay | Admitting: Family Medicine

## 2022-11-06 VITALS — BP 110/68 | HR 84 | Ht 63.0 in | Wt 153.0 lb

## 2022-11-06 DIAGNOSIS — J324 Chronic pansinusitis: Secondary | ICD-10-CM | POA: Diagnosis not present

## 2022-11-06 MED ORDER — DOXYCYCLINE HYCLATE 100 MG PO TABS: 100.0000 mg | ORAL_TABLET | Freq: Two times a day (BID) | ORAL | 0 refills | Status: DC

## 2022-11-06 NOTE — Telephone Encounter (Signed)
Urine drug screen for this encounter is consistent for prescribed medication 

## 2022-11-06 NOTE — Progress Notes (Signed)
Date:  11/06/2022   Name:  Elizabeth Torres   DOB:  1976-01-18   MRN:  [id number]   Chief Complaint: Sinusitis (On doxy- switched from ZPAck to doxy at ER. Still having headache, facial pain- ref to ENT)  Sinusitis This is a recurrent problem. The current episode started more than 1 month ago. The problem has been waxing and waning since onset. There has been no fever. The pain is moderate. Associated symptoms include ear pain, headaches and sinus pressure. Pertinent negatives include no chills, congestion, coughing, shortness of breath, sneezing or sore throat. Past treatments include oral decongestants, saline nose sprays and antibiotics. The treatment provided mild relief.    Lab Results  Component Value Date   NA 136 06/29/2022   K 4.3 06/29/2022   CO2 29 06/29/2022   GLUCOSE 93 06/29/2022   BUN 11 06/29/2022   CREATININE 0.65 06/29/2022   CALCIUM 9.5 06/29/2022   EGFR 107 01/20/2022   GFRNONAA >60 06/29/2022   Lab Results  Component Value Date   CHOL 181 01/20/2022   HDL 62 01/20/2022   LDLCALC 100 (H) 01/20/2022   TRIG 107 01/20/2022   Lab Results  Component Value Date   TSH 2.360 07/22/2021   No results found for: "HGBA1C" Lab Results  Component Value Date   WBC 7.3 06/29/2022   HGB 14.3 06/29/2022   HCT 41.5 06/29/2022   MCV 91.4 06/29/2022   PLT 270 06/29/2022   Lab Results  Component Value Date   ALT 26 01/26/2020   AST 23 01/26/2020   ALKPHOS 57 01/26/2020   BILITOT 0.3 01/26/2020   No results found for: "25OHVITD2", "25OHVITD3", "VD25OH"   Review of Systems  Constitutional:  Negative for chills.  HENT:  Positive for ear pain, postnasal drip, sinus pressure and sinus pain. Negative for congestion, ear discharge, nosebleeds, rhinorrhea, sneezing and sore throat.   Respiratory:  Negative for cough, shortness of breath and wheezing.   Cardiovascular:  Negative for palpitations.  Neurological:  Positive for headaches.    Patient Active Problem List    Diagnosis Date Noted   Other secondary kyphosis, cervical region 07/08/2022   Right arm weakness 07/08/2022   Cervical radiculopathy 07/08/2022   S/P cervical spinal fusion 07/08/2022   Cervical radicular pain 07/24/2021   Spinal stenosis in cervical region 07/24/2021   Chronic pain syndrome 07/24/2021   Cervical spondylosis with radiculopathy 02/25/2021   Mass of soft tissue of upper arm 01/31/2021   Elevated blood-pressure reading, without diagnosis of hypertension 08/26/2020   Menorrhagia with irregular cycle 04/02/2020   Rotator cuff impingement syndrome, left 12/15/2018   Cervical myofascial pain syndrome 02/14/2016   History of breast cancer 11/07/2015   Postlaminectomy syndrome, lumbar region 04/12/2012   Chemotherapy-induced neuropathy 04/12/2012   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 08/07/2011    Allergies  Allergen Reactions   Penicillins Anaphylaxis   Pregabalin Other (See Comments)    Thoughts of self harm   Sulfa Antibiotics Anaphylaxis   Ondansetron     migraines   Other    Zofran [Ondansetron Hcl]     migraines    Past Surgical History:  Procedure Laterality Date   ANTERIOR CERVICAL DECOMP/DISCECTOMY FUSION N/A 07/08/2022   Procedure: C4-7 ANTERIOR CERVICAL DISCECTOMY AND FUSION (GLOBUS HEDRON);  Surgeon: Venetia Night, MD;  Location: [facility];  Service: Neurosurgery;  Laterality: N/A;   BREAST BIOPSY Right 07/2011   invasive ductal carcinoma   BREAST LUMPECTOMY Right 08/2011  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 RAd and chemo tx   BREAST SURGERY     mastectomy Right    partial with lymph node dissection   PORT A CATH REVISION     PORT-A-CATH REMOVAL  11-07-15   Dr Lemar LivingsByrnett   SHOULDER ARTHROSCOPY WITH ROTATOR CUFF REPAIR Left 05/18/2019   Procedure: SHOULDER ARTHROSCOPY WITH SUBACROMIAL DECOMPRESSION AND DISTAL CLAVICAL EXCISION, INTRAARTICULAR DEBRIDEMENT;  Surgeon: Lyndle HerrlichBowers, James R, MD;  [facility];  Service: Orthopedics;  Laterality: Left;   SPINE SURGERY  2008   Dr Jeral FruitBotero   TUBAL LIGATION      Social History   Tobacco Use   Smoking status: Every Day    Packs/day: 1.00    Years: 20.00    Additional pack years: 0.00    Total pack years: 20.00    Types: Cigarettes   Smokeless tobacco: Never   Tobacco comments:    0.75PPD 08/31/2022  Vaping Use   Vaping Use: Never used  Substance Use Topics   Alcohol use: Not Currently   Drug use: Never     Medication list has been reviewed and updated.  Current Meds  Medication Sig   aspirin EC 81 MG tablet Take 1 tablet (81 mg total) by mouth daily. Swallow whole.   atorvastatin (LIPITOR) 20 MG tablet Take 1 tablet (20 mg total) by mouth daily.   butalbital-acetaminophen-caffeine (FIORICET) 50-325-40 MG tablet Take 1 tablet by mouth every 6 (six) hours as needed for headache.   doxycycline (VIBRA-TABS) 100 MG tablet Take 1 tablet (100 mg total) by mouth 2 (two) times daily.   hydrochlorothiazide (HYDRODIURIL) 12.5 MG tablet TAKE 1 TABLET(12.5 MG) BY MOUTH DAILY   HYDROcodone-acetaminophen (NORCO) 10-325 MG tablet Take 1 tablet by mouth 5 (five) times daily as needed.   levocetirizine (XYZAL ALLERGY 24HR) 5 MG tablet Take 1 tablet (5 mg total) by mouth every evening.   methocarbamol (ROBAXIN) 500 MG tablet TAKE 1 TABLET(500 MG) BY MOUTH FOUR TIMES DAILY   minoxidil (LONITEN) 2.5 MG tablet Take 1.25 mg by mouth 2 (two) times daily.   nortriptyline (PAMELOR) 10 MG capsule TAKE 3 CAPSULES(30 MG) BY MOUTH AT BEDTIME (Patient taking differently: Take 40 mg by mouth at bedtime. TAKE 4 CAPSULES(40 MG) BY MOUTH AT BEDTIME)   predniSONE (DELTASONE) 50 MG tablet Take 1 tablet (50 mg total) by mouth daily with breakfast. Start on 11/04/2022   triamcinolone (NASACORT) 55 MCG/ACT AERO nasal inhaler Place 1 spray into the nose 2 (two) times daily.   Vitamin D, Ergocalciferol, (DRISDOL) 1.25 MG (50000 UNIT) CAPS capsule Take 50,000 Units by mouth  once a week.   zolpidem (AMBIEN) 10 MG tablet Take 1 tablet (10 mg total) by mouth at bedtime as needed for sleep.       11/06/2022   10:19 AM 10/29/2022    7:57 AM 01/20/2022   10:06 AM 12/17/2021   10:43 AM  GAD 7 : Generalized Anxiety Score  Nervous, Anxious, on Edge 0 0 0 0  Control/stop worrying 0 0 0 0  Worry too much - different things 0 0 2 1  Trouble relaxing 0 0 0 0  Restless 0 0 0 0  Easily annoyed or irritable 0 0 1 0  Afraid - awful might happen 0 0 1 1  Total GAD 7 Score 0 0 4 2  Anxiety Difficulty Not difficult at all Not difficult at all Not difficult at all Not difficult at all  11/06/2022   10:19 AM 10/29/2022    7:56 AM 10/15/2022    8:46 AM  Depression screen PHQ 2/9  Decreased Interest 0 0 0  Down, Depressed, Hopeless 0 0 0  PHQ - 2 Score 0 0 0  Altered sleeping 0 0   Tired, decreased energy 0 0   Change in appetite 0 0   Feeling bad or failure about yourself  0 0   Trouble concentrating 0 0   Moving slowly or fidgety/restless 0 0   Suicidal thoughts  0   PHQ-9 Score 0 0   Difficult doing work/chores Not difficult at all Not difficult at all     BP Readings from Last 3 Encounters:  11/06/22 110/68  10/31/22 (!) 118/93  10/29/22 122/74    Physical Exam Vitals and nursing note reviewed. Exam conducted with a chaperone present.  Constitutional:      General: She is not in acute distress.    Appearance: She is not diaphoretic.  HENT:     Head: Normocephalic and atraumatic.     Right Ear: Tympanic membrane, ear canal and external ear normal.     Left Ear: Tympanic membrane, ear canal and external ear normal.     Nose: Mucosal edema present. No congestion or rhinorrhea.     Right Turbinates: Not swollen.     Left Turbinates: Not swollen.     Right Sinus: Maxillary sinus tenderness and frontal sinus tenderness present.     Left Sinus: No maxillary sinus tenderness or frontal sinus tenderness.     Mouth/Throat:     Mouth: Mucous membranes are  moist.  Eyes:     General:        Right eye: No discharge.        Left eye: No discharge.     Conjunctiva/sclera: Conjunctivae normal.     Pupils: Pupils are equal, round, and reactive to light.  Neck:     Thyroid: No thyromegaly.     Vascular: No JVD.  Cardiovascular:     Rate and Rhythm: Normal rate and regular rhythm.     Heart sounds: Normal heart sounds, S1 normal and S2 normal. No murmur heard.    No systolic murmur is present.     No diastolic murmur is present.     No friction rub. No gallop. No S3 or S4 sounds.  Pulmonary:     Effort: Pulmonary effort is normal.     Breath sounds: Normal breath sounds. No decreased breath sounds, wheezing, rhonchi or rales.  Abdominal:     General: Bowel sounds are normal.     Palpations: Abdomen is soft. There is no mass.     Tenderness: There is no abdominal tenderness. There is no guarding.  Musculoskeletal:        General: Normal range of motion.     Cervical back: Normal range of motion and neck supple.  Lymphadenopathy:     Cervical: No cervical adenopathy.  Skin:    General: Skin is warm and dry.  Neurological:     Mental Status: She is alert.     Cranial Nerves: Cranial nerves 2-12 are intact.     Sensory: Sensation is intact.     Motor: Motor function is intact.     Coordination: Romberg sign negative.     Deep Tendon Reflexes: Reflexes are normal and symmetric.     Wt Readings from Last 3 Encounters:  11/06/22 153 lb (69.4 kg)  10/31/22 150 lb (68 kg)  10/29/22 157 lb (71.2 kg)    BP 110/68   Pulse 84   Ht 5\' 3"  (1.6 m)   Wt 153 lb (69.4 kg)   SpO2 97%   BMI 27.10 kg/m   Assessment and Plan:  1. Chronic pansinusitis New onset.  Persistent.  Onset in February despite antibiotics patient continues to be symptomatic and on CT scan it was noted extensive paranasal sinus disease involving the right frontal and right maxillary and to some extent the right ethmoid.  There is also some effusion in the right mastoid  and patient is having some discomfort in the general vicinity of this as well we will continue doxycycline 100 mg twice a day given that patient is allergic to penicillin and will refer for first available appointment with ear nose and throat.  In the meantime patient is continuing with saline lavage and decongestant. - Ambulatory referral to ENT - doxycycline (VIBRA-TABS) 100 MG tablet; Take 1 tablet (100 mg total) by mouth 2 (two) times daily.  Dispense: 20 tablet; Refill: 0    Elizabeth Sauer, MD

## 2022-11-11 ENCOUNTER — Encounter: Payer: BC Managed Care – PPO | Attending: Physical Medicine and Rehabilitation | Admitting: Registered Nurse

## 2022-11-11 ENCOUNTER — Encounter: Payer: Self-pay | Admitting: Registered Nurse

## 2022-11-11 VITALS — BP 126/85 | HR 91 | Ht 63.0 in | Wt 156.8 lb

## 2022-11-11 DIAGNOSIS — G620 Drug-induced polyneuropathy: Secondary | ICD-10-CM | POA: Insufficient documentation

## 2022-11-11 DIAGNOSIS — Z79891 Long term (current) use of opiate analgesic: Secondary | ICD-10-CM | POA: Diagnosis not present

## 2022-11-11 DIAGNOSIS — M961 Postlaminectomy syndrome, not elsewhere classified: Secondary | ICD-10-CM | POA: Insufficient documentation

## 2022-11-11 DIAGNOSIS — T451X5A Adverse effect of antineoplastic and immunosuppressive drugs, initial encounter: Secondary | ICD-10-CM | POA: Insufficient documentation

## 2022-11-11 DIAGNOSIS — M5416 Radiculopathy, lumbar region: Secondary | ICD-10-CM | POA: Diagnosis not present

## 2022-11-11 DIAGNOSIS — Z5181 Encounter for therapeutic drug level monitoring: Secondary | ICD-10-CM | POA: Diagnosis not present

## 2022-11-11 MED ORDER — HYDROCODONE-ACETAMINOPHEN 10-325 MG PO TABS: 1.0000 | ORAL_TABLET | Freq: Every day | ORAL | 0 refills | Status: DC | PRN

## 2022-11-11 MED ORDER — ZOLPIDEM TARTRATE 10 MG PO TABS: 10.0000 mg | ORAL_TABLET | Freq: Every evening | ORAL | 2 refills | Status: DC | PRN

## 2022-11-11 NOTE — Progress Notes (Signed)
Subjective:    Patient ID: Elizabeth Torres, female    DOB: 1976-03-16, 47 y.o.   MRN: [id number]  HPI: Elizabeth Torres is a 47 y.o. female who returns for follow up appointment for chronic pain and medication refill. She states her pain is located in her lower back pain radiating into her bilateral lower extremities. She rates her pain 3. Her current exercise regime is walking and performing stretching exercises.  [name] Morphine equivalent is 50.00 MME.   Last UDS was Performed on 10/15/2022, it was consistent..     Pain Inventory Average Pain 3 Pain Right Now 3 My pain is constant and aching  In the last 24 hours, has pain interfered with the following? General activity 3 Relation with others 3 Enjoyment of life 3 What TIME of day is your pain at its worst? evening Sleep (in general) Fair  Pain is worse with: standing Pain improves with: rest, heat/ice, and medication Relief from Meds: 5  Family History  Problem Relation Age of Onset   Diabetes Mother    Hypertension Mother    Pulmonary fibrosis Mother    Heart disease Father    Heart attack Father    Other Father        unknown medical history   Peripheral Artery Disease Father    Breast cancer Paternal Aunt        18'S   Cancer Paternal Uncle    Breast cancer Paternal Uncle        69'S   Social History   Socioeconomic History   Marital status: Married    Spouse name: Elizabeth Torres   Number of children: 3   Years of education: Not on file   Highest education level: Not on file  Occupational History   Not on file  Tobacco Use   Smoking status: Every Day    Packs/day: 1.00    Years: 20.00    Additional pack years: 0.00    Total pack years: 20.00    Types: Cigarettes   Smokeless tobacco: Never   Tobacco comments:    0.75PPD 08/31/2022  Vaping Use   Vaping Use: Never used  Substance and Sexual Activity   Alcohol use: Not Currently   Drug use: Never   Sexual activity: Yes    Partners: Male  Other Topics  Concern   Not on file  Social History Narrative   Not on file   Social Determinants of Health   Financial Resource Strain: Not on file  Food Insecurity: No Food Insecurity (07/14/2022)   Hunger Vital Sign    Worried About Running Out of Food in the Last Year: Never true    Ran Out of Food in the Last Year: Never true  Transportation Needs: No Transportation Needs (07/14/2022)   PRAPARE - Administrator, Civil Service (Medical): No    Lack of Transportation (Non-Medical): No  Physical Activity: Not on file  Stress: Not on file  Social Connections: Not on file   Past Surgical History:  Procedure Laterality Date   ANTERIOR CERVICAL DECOMP/DISCECTOMY FUSION N/A 07/08/2022   Procedure: C4-7 ANTERIOR CERVICAL DISCECTOMY AND FUSION (GLOBUS HEDRON);  Surgeon: Venetia Night, MD;  Location: [facility];  Service: Neurosurgery;  Laterality: N/A;   BREAST BIOPSY Right 07/2011   invasive ductal carcinoma   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 RAd and chemo tx   BREAST SURGERY     mastectomy Right  partial with lymph node dissection   PORT A CATH REVISION     PORT-A-CATH REMOVAL  11-07-15   Dr Lemar LivingsByrnett   SHOULDER ARTHROSCOPY WITH ROTATOR CUFF REPAIR Left 05/18/2019   Procedure: SHOULDER ARTHROSCOPY WITH SUBACROMIAL DECOMPRESSION AND DISTAL CLAVICAL EXCISION, INTRAARTICULAR DEBRIDEMENT;  Surgeon: Lyndle HerrlichBowers, James R, MD;  [facility];  Service: Orthopedics;  Laterality: Left;   SPINE SURGERY  2008   Dr Jeral FruitBotero   TUBAL LIGATION     Past Surgical History:  Procedure Laterality Date   ANTERIOR CERVICAL DECOMP/DISCECTOMY FUSION N/A 07/08/2022   Procedure: C4-7 ANTERIOR CERVICAL DISCECTOMY AND FUSION (GLOBUS HEDRON);  Surgeon: Venetia NightYarbrough, Chester, MD;  Location: [facility];  Service: Neurosurgery;  Laterality: N/A;   BREAST BIOPSY Right 07/2011   invasive ductal carcinoma   BREAST LUMPECTOMY Right 08/2011   invasive ductal  carcinoma and DCIS. Margins clear. RAd and chemo tx   BREAST SURGERY     mastectomy Right    partial with lymph node dissection   PORT A CATH REVISION     PORT-A-CATH REMOVAL  11-07-15   Dr Lemar LivingsByrnett   SHOULDER ARTHROSCOPY WITH ROTATOR CUFF REPAIR Left 05/18/2019   Procedure: SHOULDER ARTHROSCOPY WITH SUBACROMIAL DECOMPRESSION AND DISTAL CLAVICAL EXCISION, INTRAARTICULAR DEBRIDEMENT;  Surgeon: Lyndle HerrlichBowers, James R, MD;  [facility];  Service: Orthopedics;  Laterality: Left;   SPINE SURGERY  2008   Dr Jeral FruitBotero   TUBAL LIGATION     Past Medical History:  Diagnosis Date   BRCA negative    Breast cancer 2013   RT LUMPECTOMY with chemo and rad tx   Degenerative disc disease, lumbar    Hypertension    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   Personal history of chemotherapy 2013   for breast ca   Personal history of radiation therapy 2013   F/U right breast cancer    Pneumonia    Radiation 2013   BREAST CA   Status post chemotherapy 2013   BREAST CA   BP 126/85   Pulse 91   Ht 5\' 3"  (1.6 m)   Wt 156 lb 12.8 oz (71.1 kg)   SpO2 95%   BMI 27.78 kg/m   Opioid Risk Score:   Fall Risk Score:  `1  Dep[facility] screen [facility]     11/06/2022   10:19 AM 10/29/2022    7:56 AM 10/15/2022    8:46 AM 09/10/2022    9:14 AM 08/18/2022    9:27 AM 06/16/2022   10:32 AM 05/06/2022    9:53 AM  Depression screen PHQ 2/9  Decreased Interest 0 0 0 0 0 0 0  Down, Depressed, Hopeless 0 0 0 0 0 0 0  PHQ - 2 Score 0 0 0 0 0 0 0  Altered sleeping 0 0       Tired, decreased energy 0 0       Change in appetite 0 0       Feeling bad or failure about yourself  0 0       Trouble concentrating 0 0       Moving slowly or fidgety/restless 0 0       Suicidal thoughts  0       PHQ-9 Score 0 0       Difficult doing work/chores Not difficult at all Not difficult at all         Review of Systems  Musculoskeletal:  Positive for back pain.       Bilateral leg  pain  All other systems reviewed and are  negative.     Objective:   Physical Exam Vitals and nursing note reviewed.  Constitutional:      Appearance: Normal appearance.  Cardiovascular:     Rate and Rhythm: Normal rate and regular rhythm.     Pulses: Normal pulses.     Heart sounds: Normal heart sounds.  Pulmonary:     Effort: Pulmonary effort is normal.     Breath sounds: Normal breath sounds.  Musculoskeletal:     Cervical back: Normal range of motion and neck supple.     Comments: Normal Muscle Bulk and Muscle Testing Reveals:  Upper Extremities: Full ROM and Muscle Strength 5/5 Lower Extremities: Full ROM and Muscle Strength 5/5 Arises from Chair with ease Narrow Based  Gait     Skin:    General: Skin is warm and dry.  Neurological:     Mental Status: She is alert and oriented to person, place, and time.  Psychiatric:        Mood and Affect: Mood normal.        Behavior: Behavior normal.         Assessment & Plan:  1. Lumbar postlaminectomy syndrome status post L5-S1 fusion with chronic S1 radiculopathy. Continue current medication regimen with Nortriptyline. 11/11/2022. Refilled :  Hydrocodone 10/325 mg one tablet 5 times a day as needed for pain #150 11/11/2022. We will continue the opioid monitoring program, this consists of regular clinic visits, examinations, urine drug screen, pill counts as well as use of [place] Controlled Substance Reporting system. A 12 month History has been reviewed on the [place] Controlled Substance Reporting System on 11/11/2022. 2. 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 11/11/2022 3. Insomnia: Continue current medication regimen Ambien. 11/11/2022  4.Chronic Left Shoulder Pain: No complaints today: S/P Left Shoulder Arthroscopy with Rotator Cuff Repair on 05/18/2019 by Dr. Aldean Ast : Ortho Following. 11/11/2022 5. Cervicalgia/ Cervical Radiculitis: Continue Pamelor.S/P on  07/08/2022. with Dr Myer Haff:  C4-7 ANTERIOR CERVICAL DISCECTOMY AND FUSION (GLOBUS HEDRON) N/A General   We will continue to monitor.  11/11/2022   F/U in 1 month.

## 2022-11-17 ENCOUNTER — Telehealth: Payer: Self-pay | Admitting: Cardiology

## 2022-11-17 NOTE — Telephone Encounter (Signed)
-----   Message from Bryna Colander, RN sent at 11/17/2022 10:55 AM EDT ----- Scheduling could we move her appointment until after that echo has been done? Please assist.   Thanks Jearld Adjutant for the heads up.  ----- Message ----- From: Kendrick Fries, CMA Sent: 11/17/2022  10:46 AM EDT To: Bryna Colander, RN  Pt has f/u appointment with [facility] on 4/22. Pt f/u echo/ myoview. Pt pending echo 5/2. Please advise if ok to keep future appointment or if pt should reschedule due to incomplete testing.

## 2022-11-17 NOTE — Telephone Encounter (Signed)
Left voicemail to reschedule 4/22 appt

## 2022-11-19 NOTE — Telephone Encounter (Signed)
Left voicemail to reschedule 11/23/22 follow up appt/needs to be after echo on 12/03/22. LS 4/18

## 2022-11-20 ENCOUNTER — Telehealth: Payer: Self-pay | Admitting: *Deleted

## 2022-11-20 NOTE — Telephone Encounter (Signed)
-----   Message from Kendrick Fries, [place] sent at 11/17/2022 10:45 AM EDT ----- Pt has f/u appointment with [facility] on 4/22. Pt f/u echo/ myoview. Pt pending echo 5/2. Please advise if ok to keep future appointment or if pt should reschedule due to incomplete testing.

## 2022-11-20 NOTE — Telephone Encounter (Signed)
Duplicate note

## 2022-11-23 ENCOUNTER — Ambulatory Visit: Payer: BC Managed Care – PPO | Admitting: Cardiology

## 2022-11-23 ENCOUNTER — Ambulatory Visit: Payer: BC Managed Care – PPO | Admitting: Student in an Organized Health Care Education/Training Program

## 2022-11-24 ENCOUNTER — Ambulatory Visit: Payer: BC Managed Care – PPO | Admitting: Cardiovascular Disease

## 2022-11-24 ENCOUNTER — Ambulatory Visit: Payer: BC Managed Care – PPO | Admitting: Cardiology

## 2022-11-30 ENCOUNTER — Encounter: Payer: Self-pay | Admitting: Family Medicine

## 2022-12-03 ENCOUNTER — Ambulatory Visit: Payer: BC Managed Care – PPO | Attending: Cardiovascular Disease

## 2022-12-03 DIAGNOSIS — R0602 Shortness of breath: Secondary | ICD-10-CM

## 2022-12-03 LAB — ECHOCARDIOGRAM COMPLETE
AR max vel: 3.2 cm2
AV Area VTI: 3.29 cm2
AV Area mean vel: 3.26 cm2
AV Mean grad: 2 mmHg
AV Peak grad: 3.4 mmHg
Ao pk vel: 0.92 m/s
Area-P 1/2: 2.5 cm2
S' Lateral: 3.1 cm

## 2022-12-09 ENCOUNTER — Encounter: Payer: BC Managed Care – PPO | Attending: Physical Medicine and Rehabilitation | Admitting: Registered Nurse

## 2022-12-09 ENCOUNTER — Encounter: Payer: Self-pay | Admitting: Registered Nurse

## 2022-12-09 VITALS — BP 111/74 | HR 99 | Ht 63.0 in | Wt 154.0 lb

## 2022-12-09 DIAGNOSIS — Z79891 Long term (current) use of opiate analgesic: Secondary | ICD-10-CM

## 2022-12-09 DIAGNOSIS — M5416 Radiculopathy, lumbar region: Secondary | ICD-10-CM | POA: Diagnosis not present

## 2022-12-09 DIAGNOSIS — M961 Postlaminectomy syndrome, not elsewhere classified: Secondary | ICD-10-CM | POA: Insufficient documentation

## 2022-12-09 DIAGNOSIS — G894 Chronic pain syndrome: Secondary | ICD-10-CM | POA: Diagnosis not present

## 2022-12-09 DIAGNOSIS — G620 Drug-induced polyneuropathy: Secondary | ICD-10-CM | POA: Diagnosis not present

## 2022-12-09 DIAGNOSIS — T451X5A Adverse effect of antineoplastic and immunosuppressive drugs, initial encounter: Secondary | ICD-10-CM | POA: Diagnosis not present

## 2022-12-09 DIAGNOSIS — Z5181 Encounter for therapeutic drug level monitoring: Secondary | ICD-10-CM

## 2022-12-09 MED ORDER — HYDROCODONE-ACETAMINOPHEN 10-325 MG PO TABS: 1.0000 | ORAL_TABLET | Freq: Every day | ORAL | 0 refills | Status: DC | PRN

## 2022-12-09 NOTE — Progress Notes (Signed)
Subjective:    Patient ID: Elizabeth Torres, female    DOB: 1976-01-26, 47 y.o.   MRN: [id number]  HPI: Elizabeth Torres is a 47 y.o. female who returns for follow up appointment for chronic pain and medication refill. She states her pain is located in her lower back pain radiating into her bilateral lower extremities. She rates her pain 4. Her current exercise regime is walking and performing stretching exercises.  Ms. Wilkerson Morphine equivalent is 50.00 MME.   Last UDS was Performed on 10/15/2022, it was consistent.     Pain Inventory Average Pain 4 Pain Right Now 4 My pain is constant and aching  In the last 24 hours, has pain interfered with the following? General activity 3 Relation with others 3 Enjoyment of life 3 What TIME of day is your pain at its worst? evening Sleep (in general) Fair  Pain is worse with: standing Pain improves with: rest, heat/ice, and medication Relief from Meds: 5  Family History  Problem Relation Age of Onset   Diabetes Mother    Hypertension Mother    Pulmonary fibrosis Mother    Heart disease Father    Heart attack Father    Other Father        unknown medical history   Peripheral Artery Disease Father    Breast cancer Paternal Aunt        46'S   Cancer Paternal Uncle    Breast cancer Paternal Uncle        37'S   Social History   Socioeconomic History   Marital status: Married    Spouse name: Verletta Matesic   Number of children: 3   Years of education: Not on file   Highest education level: Not on file  Occupational History   Not on file  Tobacco Use   Smoking status: Every Day    Packs/day: 1.00    Years: 20.00    Additional pack years: 0.00    Total pack years: 20.00    Types: Cigarettes   Smokeless tobacco: Never   Tobacco comments:    0.75PPD 08/31/2022  Vaping Use   Vaping Use: Never used  Substance and Sexual Activity   Alcohol use: Not Currently   Drug use: Never   Sexual activity: Yes    Partners: Male  Other Topics  Concern   Not on file  Social History Narrative   Not on file   Social Determinants of Health   Financial Resource Strain: Not on file  Food Insecurity: No Food Insecurity (07/14/2022)   Hunger Vital Sign    Worried About Running Out of Food in the Last Year: Never true    Ran Out of Food in the Last Year: Never true  Transportation Needs: No Transportation Needs (07/14/2022)   PRAPARE - Administrator, Civil Service (Medical): No    Lack of Transportation (Non-Medical): No  Physical Activity: Not on file  Stress: Not on file  Social Connections: Not on file   Past Surgical History:  Procedure Laterality Date   ANTERIOR CERVICAL DECOMP/DISCECTOMY FUSION N/A 07/08/2022   Procedure: C4-7 ANTERIOR CERVICAL DISCECTOMY AND FUSION (GLOBUS HEDRON);  Surgeon: Venetia Night, MD;  Location: [facility];  Service: Neurosurgery;  Laterality: N/A;   BREAST BIOPSY Right 07/2011   invasive ductal carcinoma   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 RAd and chemo tx   BREAST SURGERY     mastectomy Right  partial with lymph node dissection   PORT A CATH REVISION     PORT-A-CATH REMOVAL  11-07-15   Dr Lemar Livings   SHOULDER ARTHROSCOPY WITH ROTATOR CUFF REPAIR Left 05/18/2019   Procedure: SHOULDER ARTHROSCOPY WITH SUBACROMIAL DECOMPRESSION AND DISTAL CLAVICAL EXCISION, INTRAARTICULAR DEBRIDEMENT;  Surgeon: Lyndle Herrlich, MD;  Location: [facility];  Service: Orthopedics;  Laterality: Left;   SPINE SURGERY  2008   Dr Jeral Fruit   TUBAL LIGATION     Past Surgical History:  Procedure Laterality Date   ANTERIOR CERVICAL DECOMP/DISCECTOMY FUSION N/A 07/08/2022   Procedure: C4-7 ANTERIOR CERVICAL DISCECTOMY AND FUSION (GLOBUS HEDRON);  Surgeon: Venetia Night, MD;  Location: [facility];  Service: Neurosurgery;  Laterality: N/A;   BREAST BIOPSY Right 07/2011   invasive ductal carcinoma   BREAST LUMPECTOMY Right 08/2011   invasive ductal  carcinoma and DCIS. Margins clear. RAd and chemo tx   BREAST SURGERY     mastectomy Right    partial with lymph node dissection   PORT A CATH REVISION     PORT-A-CATH REMOVAL  11-07-15   Dr Lemar Livings   SHOULDER ARTHROSCOPY WITH ROTATOR CUFF REPAIR Left 05/18/2019   Procedure: SHOULDER ARTHROSCOPY WITH SUBACROMIAL DECOMPRESSION AND DISTAL CLAVICAL EXCISION, INTRAARTICULAR DEBRIDEMENT;  Surgeon: Lyndle Herrlich, MD;  Location: [facility];  Service: Orthopedics;  Laterality: Left;   SPINE SURGERY  2008   Dr Jeral Fruit   TUBAL LIGATION     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   Personal history of chemotherapy 2013   for breast ca   Personal history of radiation therapy 2013   F/U right breast cancer    Pneumonia    Radiation 2013   BREAST CA   Status post chemotherapy 2013   BREAST CA   BP 111/74   Pulse 99   Ht 5\' 3"  (1.6 m)   Wt 154 lb (69.9 kg)   SpO2 95%   BMI 27.28 kg/m   Opioid Risk Score:   Fall Risk Score:  `1  Depression screen [facility] 2/9     11/06/2022   10:19 AM 10/29/2022    7:56 AM 10/15/2022    8:46 AM 09/10/2022    9:14 AM 08/18/2022    9:27 AM 06/16/2022   10:32 AM 05/06/2022    9:53 AM  Depression screen PHQ 2/9  Decreased Interest 0 0 0 0 0 0 0  Down, Depressed, Hopeless 0 0 0 0 0 0 0  PHQ - 2 Score 0 0 0 0 0 0 0  Altered sleeping 0 0       Tired, decreased energy 0 0       Change in appetite 0 0       Feeling bad or failure about yourself  0 0       Trouble concentrating 0 0       Moving slowly or fidgety/restless 0 0       Suicidal thoughts  0       PHQ-9 Score 0 0       Difficult doing work/chores Not difficult at all Not difficult at all          Review of Systems  Musculoskeletal:  Positive for back pain.       Bilateral leg  pain  All other systems reviewed and are  negative.     Objective:   Physical Exam Vitals and nursing note reviewed.  Constitutional:      Appearance: Normal appearance.  Cardiovascular:     Rate and Rhythm: Normal rate and regular rhythm.     Pulses: Normal pulses.     Heart sounds: Normal heart sounds.  Pulmonary:     Effort: Pulmonary effort is normal.     Breath sounds: Normal breath sounds.  Musculoskeletal:     Cervical back: Normal range of motion and neck supple.     Comments: Normal Muscle Bulk and Muscle Testing Reveals:  Upper Extremities: Full ROM and Muscle Strength 5/5  Lower Extremities: Full ROM and Muscle Strength 5/5 Arises from Table  Gait     Skin:    General: Skin is warm and dry.  Neurological:     Mental Status: She is alert and oriented to person, place, and time.  Psychiatric:        Mood and Affect: Mood normal.        Behavior: Behavior normal.           Assessment & Plan:  1. Lumbar postlaminectomy syndrome status post L5-S1 fusion with chronic S1 radiculopathy. Continue current medication regimen with Nortriptyline. 005/03/2023. Refilled :  Hydrocodone 10/325 mg one tablet 5 times a day as needed for pain #150 12/09/2022. We will continue the opioid monitoring program, this consists of regular clinic visits, examinations, urine drug screen, pill counts as well as use of [place] Controlled Substance Reporting system. A 12 month History has been reviewed on the [place] Controlled Substance Reporting System on 12/09/2022. 2. 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 12/09/2022 3. Insomnia: Continue current medication regimen Ambien. 12/09/2022  4.Chronic Left Shoulder Pain: No complaints today: S/P Left Shoulder Arthroscopy with Rotator Cuff Repair on 05/18/2019 by Dr. Aldean Ast : Ortho Following. 12/09/2022 5. Cervicalgia/ Cervical Radiculitis: Continue Pamelor.S/P on 07/08/2022. with Dr  Myer Haff:  C4-7 ANTERIOR CERVICAL DISCECTOMY AND FUSION (GLOBUS HEDRON) N/A General   We will continue to monitor.  12/09/2022   F/U in 1 month.

## 2022-12-10 NOTE — Progress Notes (Signed)
Cardiology Office Note:   Date:  12/11/2022  ID:  Elizabeth Torres, DOB 1975/08/10, MRN [id number]  History of Present Illness:   Elizabeth Torres is a 47 y.o. female with a past medical history of essential hypertension, hyperlipidemia, breast cancer status post left lumpectomy followed by radiation and chemotherapy, and tobacco use, who is here today to follow up.  She was last seen in clinic 09/16/2022 by Dr.Arida.  At she was complaining of shortness of breath chest pain.  She continues to smoke approximately 1 pack/day for 2 weeks for at least 20 years.  She was scheduled for treadmill nuclear stress test as well as an echocardiogram.  Was also recommended that she start atorvastatin 20 mg daily with an LDL goal below 70.  She presented to [facility] emergency department 10/31/2022 for headache for the last 6 days.  She just recently treated her primary care provider for sinus infection and was placed on azithromycin and using multiple over-the-counter medications without relief.  Blood pressure was 118/93, heart rate 115, respirations 16, temperature 98.5.  She was treated with migraine cocktail, antibiotics were changed, and she was subsequently discharged.  She returns to clinic today stating that she has been doing well.  Since she is gone for sinus infection she has had no complaints.  Shortness of breath remains the same she did have PFTs with pulmonary and needs to reschedule follow-up appointment is after having her PFTs completed it was noted that she would likely benefit from an inhaler.  She denies any chest pain, palpitations, or peripheral edema.  Has been compliant with medications without issues.  She does have a yearly appointment coming up with her primary care provider in July at that time with blood work to rehab for lipid panel recheck since starting on atorvastatin.  She denies any recent hospitalizations or visits to the emergency department.  ROS: 10 point review of systems has been  completed and is considered negative with exception of what is in the HPI  Studies Reviewed:    EKG: No new tracings were completed today  TTE 12/03/22 1. Left ventricular ejection fraction, by estimation, is 60 to 65%. The  left ventricle has normal function. The left ventricle has no regional  wall motion abnormalities. Left ventricular diastolic parameters are  consistent with Grade I diastolic  dysfunction (impaired relaxation). The average left ventricular global  longitudinal strain is -12.5 %.   2. Right ventricular systolic function is normal. The right ventricular  size is normal.   3. The mitral valve is normal in structure. No evidence of mitral valve  regurgitation. No evidence of mitral stenosis.   4. The aortic valve has an indeterminant number of cusps. Aortic valve  regurgitation is not visualized. No aortic stenosis is present.   5. The inferior vena cava is normal in size with greater than 50%  respiratory variability, suggesting right atrial pressure of 3 mmHg.    Stress test 09/24/22 Exercise myocardial perfusion imaging study with no significant  ischemia Normal wall motion, EF estimated at 65% No EKG changes concerning for ischemia at peak stress or in recovery. Target heart rate achieved, 7 METS, exercise time 5 minutes 23 seconds, heart rate 112 bpm at 3 minutes in recovery CT attenuation correction images with no significant coronary calcification or aortic atherosclerosis Low risk scan  Risk Assessment/Calculations:              Physical Exam:   VS:  BP 110/74 (BP Location: Left Arm,  Patient Position: Sitting, Cuff Size: Normal)   Pulse (!) 101   Ht 5\' 3"  (1.6 m)   Wt 153 lb 2 oz (69.5 kg)   SpO2 98%   BMI 27.12 kg/m    Wt Readings from Last 3 Encounters:  12/11/22 153 lb 2 oz (69.5 kg)  12/09/22 154 lb (69.9 kg)  11/11/22 156 lb 12.8 oz (71.1 kg)     GEN: Well nourished, well developed in no acute distress NECK: No JVD; No carotid  bruits CARDIAC: RRR, cardiac, no murmurs, rubs, gallops RESPIRATORY:  Clear to auscultation without rales, wheezing or rhonchi  ABDOMEN: Soft, non-tender, non-distended EXTREMITIES:  No edema; No deformity   ASSESSMENT AND PLAN:   Coronary artery disease involving native coronary arteries without angina.  CT scan revealed images of moderate coronary artery calcifications.  She is currently chest pain free.  She does underwent stress testing which was considered a low risk scan which revealed no ischemia and no EKG changes.  Echocardiogram revealed no regional wall motion abnormality with an LVEF of 60 to 65%.  She is continued on aspirin 81 mg daily and atorvastatin 20 mg daily.  Exertional dyspnea where she has followed up with pulmonary and had PFTs completed which revealed that she would benefit from an inhaler.  She is to call and make her pulmonary follow-up prior to starting that therapy.Echocardiogram revealed an LVEF of 60 to 65%, no regional wall motion abnormalities, G1 DD, and no valvular abnormalities were noted.  Hyperlipidemia with an LDL of 100.  She does have upcoming lab work with her PCP in July.  She has been continued on atorvastatin 20 mg with a goal of LDL below 70 due to her aortic and coronary calcifications that were noted on CT scan.  She has been tolerating the medication well requires no refills today.  Tobacco abuse with total cessation recommended.  Disposition patient return to clinic to see MD/APP in 6 months or sooner if needed.        Signed, Kolin Erdahl, NP

## 2022-12-11 ENCOUNTER — Ambulatory Visit: Payer: BC Managed Care – PPO | Attending: Cardiology | Admitting: Cardiology

## 2022-12-11 ENCOUNTER — Encounter: Payer: Self-pay | Admitting: Cardiology

## 2022-12-11 VITALS — BP 110/74 | HR 101 | Ht 63.0 in | Wt 153.1 lb

## 2022-12-11 DIAGNOSIS — Z720 Tobacco use: Secondary | ICD-10-CM | POA: Diagnosis not present

## 2022-12-11 DIAGNOSIS — I2510 Atherosclerotic heart disease of native coronary artery without angina pectoris: Secondary | ICD-10-CM

## 2022-12-11 DIAGNOSIS — E785 Hyperlipidemia, unspecified: Secondary | ICD-10-CM

## 2022-12-11 DIAGNOSIS — R0602 Shortness of breath: Secondary | ICD-10-CM | POA: Diagnosis not present

## 2022-12-11 NOTE — Patient Instructions (Signed)
Medication Instructions:  Your physician recommends that you continue on your current medications as directed. Please refer to the Current Medication list given to you today.  *If you need a refill on your cardiac medications before your next appointment, please call your pharmacy*  Lab Work: -None ordered If you have labs (blood work) drawn today and your tests are completely normal, you will receive your results only by: MyChart Message (if you have MyChart) OR A paper copy in the mail If you have any lab test that is abnormal or we need to change your treatment, we will call you to review the results.  Testing/Procedures: -None ordered  Follow-Up: At [facility], you and your health needs are our priority.  As part of our continuing mission to provide you with exceptional heart care, we have created designated Provider Care Teams.  These Care Teams include your primary Cardiologist (physician) and Advanced Practice Providers (APPs -  Physician Assistants and Nurse Practitioners) who all work together to provide you with the care you need, when you need it.  We recommend signing up for the patient portal called "MyChart".  Sign up information is provided on this After Visit Summary.  MyChart is used to connect with patients for Virtual Visits (Telemedicine).  Patients are able to view lab/test results, encounter notes, upcoming appointments, etc.  Non-urgent messages can be sent to your provider as well.   To learn more about what you can do with MyChart, go to [email or web address].    Your next appointment:   6 month(s)  Provider:   You may see Muhammad Arida, MD or one of the following Advanced Practice Providers on your designated Care Team:   Christopher Berge, NP Ryan Dunn, PA-C Cadence Furth, PA-C Sheri Hammock, NP  Other Instructions -None  

## 2022-12-21 DIAGNOSIS — L650 Telogen effluvium: Secondary | ICD-10-CM | POA: Diagnosis not present

## 2022-12-21 DIAGNOSIS — L309 Dermatitis, unspecified: Secondary | ICD-10-CM | POA: Diagnosis not present

## 2022-12-21 DIAGNOSIS — D229 Melanocytic nevi, unspecified: Secondary | ICD-10-CM | POA: Diagnosis not present

## 2022-12-21 DIAGNOSIS — L648 Other androgenic alopecia: Secondary | ICD-10-CM | POA: Diagnosis not present

## 2023-01-07 ENCOUNTER — Encounter: Payer: Self-pay | Admitting: Registered Nurse

## 2023-01-07 ENCOUNTER — Encounter: Payer: BC Managed Care – PPO | Attending: Physical Medicine and Rehabilitation | Admitting: Registered Nurse

## 2023-01-07 VITALS — BP 131/84 | HR 99 | Ht 63.0 in | Wt 150.0 lb

## 2023-01-07 DIAGNOSIS — Z79891 Long term (current) use of opiate analgesic: Secondary | ICD-10-CM | POA: Insufficient documentation

## 2023-01-07 DIAGNOSIS — G894 Chronic pain syndrome: Secondary | ICD-10-CM | POA: Insufficient documentation

## 2023-01-07 DIAGNOSIS — G620 Drug-induced polyneuropathy: Secondary | ICD-10-CM | POA: Insufficient documentation

## 2023-01-07 DIAGNOSIS — Z5181 Encounter for therapeutic drug level monitoring: Secondary | ICD-10-CM | POA: Insufficient documentation

## 2023-01-07 DIAGNOSIS — M5416 Radiculopathy, lumbar region: Secondary | ICD-10-CM | POA: Insufficient documentation

## 2023-01-07 DIAGNOSIS — T451X5A Adverse effect of antineoplastic and immunosuppressive drugs, initial encounter: Secondary | ICD-10-CM | POA: Insufficient documentation

## 2023-01-07 NOTE — Progress Notes (Signed)
Subjective:    Patient ID: Elizabeth Torres, female    DOB: 02/08/1976, 47 y.o.   MRN: [id number]  HPI: Elizabeth Torres is a 47 y.o. female who is scheduled for My- Chart visit, her daughter is expecting her baby any day and she has several doctors appointments this month, she reports. I connected with Elizabeth Torres  on 01/07/2023 at her [facility] [employer] by a video enabled telemedicine application and verified that I am speaking with the correct person using two identifiers. Elizabeth Torres reports she was unable to hear this provider, we use the Chat function of the video visit. This provider could hear her clearly.   Location: Patient: At Her Home  Provider: In the Office    I discussed the limitations of evaluation and management by telemedicine and the availability of in person appointments. The patient expressed understanding and agreed to proceed.  She states her  pain is located in her lower back radiating into her bilateral lower extremities. She rates her pain 4. Her current exercise regime is walking and performing stretching exercises.  Ms. Dettman Morphine equivalent is 50.00 MME.   Last UDS was Performed on 10/15/2022, it was consistent.     Pain Inventory Average Pain 4 Pain Right Now 4 My pain is constant, dull, and aching  In the last 24 hours, has pain interfered with the following? General activity 3 Relation with others 3 Enjoyment of life 2 What TIME of day is your pain at its worst? evening Sleep (in general) Fair  Pain is worse with: inactivity and standing Pain improves with: rest and medication Relief from Meds: 6  Family History  Problem Relation Age of Onset   Diabetes Mother    Hypertension Mother    Pulmonary fibrosis Mother    Heart disease Father    Heart attack Father    Other Father        unknown medical history   Peripheral Artery Disease Father    Breast cancer Paternal Aunt        85'S   Cancer Paternal Uncle    Breast cancer Paternal Uncle         79'S   Social History   Socioeconomic History   Marital status: Married    Spouse name: Arrihanna Alberici   Number of children: 3   Years of education: Not on file   Highest education level: Not on file  Occupational History   Not on file  Tobacco Use   Smoking status: Every Day    Packs/day: 1.00    Years: 20.00    Additional pack years: 0.00    Total pack years: 20.00    Types: Cigarettes   Smokeless tobacco: Never   Tobacco comments:    0.75PPD 08/31/2022  Vaping Use   Vaping Use: Never used  Substance and Sexual Activity   Alcohol use: Not Currently   Drug use: Never   Sexual activity: Yes    Partners: Male  Other Topics Concern   Not on file  Social History Narrative   Not on file   Social Determinants of Health   Financial Resource Strain: Not on file  Food Insecurity: No Food Insecurity (07/14/2022)   Hunger Vital Sign    Worried About Running Out of Food in the Last Year: Never true    Ran Out of Food in the Last Year: Never true  Transportation Needs: No Transportation Needs (07/14/2022)   PRAPARE - Transportation  Lack of Transportation (Medical): No    Lack of Transportation (Non-Medical): No  Physical Activity: Not on file  Stress: Not on file  Social Connections: Not on file   Past Surgical History:  Procedure Laterality Date   ANTERIOR CERVICAL DECOMP/DISCECTOMY FUSION N/A 07/08/2022   Procedure: C4-7 ANTERIOR CERVICAL DISCECTOMY AND FUSION (GLOBUS HEDRON);  Surgeon: Venetia Night, MD;  Location: [facility];  Service: Neurosurgery;  Laterality: N/A;   BREAST BIOPSY Right 07/2011   invasive ductal carcinoma   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 RAd and chemo tx   BREAST SURGERY     mastectomy Right    partial with lymph node dissection   PORT A CATH REVISION     PORT-A-CATH REMOVAL  11-07-15   Dr Lemar Livings   SHOULDER ARTHROSCOPY WITH ROTATOR CUFF REPAIR Left 05/18/2019   Procedure: SHOULDER  ARTHROSCOPY WITH SUBACROMIAL DECOMPRESSION AND DISTAL CLAVICAL EXCISION, INTRAARTICULAR DEBRIDEMENT;  Surgeon: Lyndle Herrlich, MD;  Location: [facility];  Service: Orthopedics;  Laterality: Left;   SPINE SURGERY  2008   Dr Jeral Fruit   TUBAL LIGATION     Past Surgical History:  Procedure Laterality Date   ANTERIOR CERVICAL DECOMP/DISCECTOMY FUSION N/A 07/08/2022   Procedure: C4-7 ANTERIOR CERVICAL DISCECTOMY AND FUSION (GLOBUS HEDRON);  Surgeon: Venetia Night, MD;  Location: [facility];  Service: Neurosurgery;  Laterality: N/A;   BREAST BIOPSY Right 07/2011   invasive ductal carcinoma   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 RAd and chemo tx   BREAST SURGERY     mastectomy Right    partial with lymph node dissection   PORT A CATH REVISION     PORT-A-CATH REMOVAL  11-07-15   Dr Lemar Livings   SHOULDER ARTHROSCOPY WITH ROTATOR CUFF REPAIR Left 05/18/2019   Procedure: SHOULDER ARTHROSCOPY WITH SUBACROMIAL DECOMPRESSION AND DISTAL CLAVICAL EXCISION, INTRAARTICULAR DEBRIDEMENT;  Surgeon: Lyndle Herrlich, MD;  Location: [facility];  Service: Orthopedics;  Laterality: Left;   SPINE SURGERY  2008   Dr Jeral Fruit   TUBAL LIGATION     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  Opioid Risk Score:   Fall Risk Score:  `1  Depression screen [facility] 2/9     11/06/2022   10:19 AM 10/29/2022    7:56 AM 10/15/2022    8:46 AM 09/10/2022    9:14 AM 08/18/2022    9:27 AM 06/16/2022   10:32 AM 05/06/2022    9:53 AM   Depression screen PHQ 2/9  Decreased Interest 0 0 0 0 0 0 0  Down, Depressed, Hopeless 0 0 0 0 0 0 0  PHQ - 2 Score 0 0 0 0 0 0 0  Altered sleeping 0 0       Tired, decreased energy 0 0       Change in appetite 0 0  Feeling bad or failure about yourself  0 0       Trouble concentrating 0 0       Moving slowly or fidgety/restless 0 0       Suicidal thoughts  0       PHQ-9 Score 0 0       Difficult doing work/chores Not difficult at all Not difficult at all          Review of Systems  Musculoskeletal:  Positive for back pain.       B/L leg pain       Objective:   Physical Exam Vitals and nursing note reviewed.  Musculoskeletal:     Comments: No Physical Exam Performed: Video Visit          Assessment & Plan:  1. Lumbar postlaminectomy syndrome status post L5-S1 fusion with chronic S1 radiculopathy. Continue current medication regimen with Nortriptyline. 01/07/2023. Refilled :  Hydrocodone 10/325 mg one tablet 5 times a day as needed for pain #150 01/07/2023. We will continue the opioid monitoring program, this consists of regular clinic visits, examinations, urine drug screen, pill counts as well as use of [place] Controlled Substance Reporting system. A 12 month History has been reviewed on the [place] Controlled Substance Reporting System on 01/07/2023. 2. 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 01/07/2023 3. Insomnia: Continue current medication regimen Ambien. 01/07/2023  4.Chronic Left Shoulder Pain: No complaints today: S/P Left Shoulder Arthroscopy with Rotator Cuff Repair on 05/18/2019 by Dr. Aldean Ast : Ortho Following. 01/07/2023 5. Cervicalgia/ Cervical Radiculitis: Continue Pamelor.S/P on 07/08/2022. with Dr Myer Haff:  C4-7 ANTERIOR CERVICAL DISCECTOMY AND FUSION (GLOBUS HEDRON) N/A General   We will continue to monitor.  01/07/2023   F/U in 1 month.     I discussed the assessment and treatment plan with the patient. The patient was provided an opportunity to ask questions and all were answered. The patient agreed with the plan and demonstrated an understanding of the instructions.

## 2023-01-11 ENCOUNTER — Encounter: Payer: Self-pay | Admitting: Family Medicine

## 2023-01-12 ENCOUNTER — Ambulatory Visit: Payer: BC Managed Care – PPO | Admitting: Family Medicine

## 2023-01-13 ENCOUNTER — Ambulatory Visit
Admission: EM | Admit: 2023-01-13 | Discharge: 2023-01-13 | Disposition: A | Discharge status: Home / Self Care | Payer: BC Managed Care – PPO | Attending: Family Medicine | Admitting: Family Medicine

## 2023-01-13 ENCOUNTER — Ambulatory Visit (INDEPENDENT_AMBULATORY_CARE_PROVIDER_SITE_OTHER): Payer: BC Managed Care – PPO

## 2023-01-13 ENCOUNTER — Telehealth: Payer: Self-pay | Admitting: Registered Nurse

## 2023-01-13 DIAGNOSIS — M79672 Pain in left foot: Secondary | ICD-10-CM | POA: Diagnosis not present

## 2023-01-13 DIAGNOSIS — M7989 Other specified soft tissue disorders: Secondary | ICD-10-CM | POA: Diagnosis not present

## 2023-01-13 MED ORDER — HYDROCODONE-ACETAMINOPHEN 10-325 MG PO TABS: 1.0000 | ORAL_TABLET | Freq: Every day | ORAL | 0 refills | Status: DC | PRN

## 2023-01-13 NOTE — ED Provider Notes (Signed)
[facility]    CSN: [id number] Arrival date & time: 01/13/23  0955      History   Chief Complaint Chief Complaint  Patient presents with   Foot Pain    HPI  HPI Elizabeth Torres is a 47 y.o. female.   Elizabeth Torres presents for left foot pain for the past 10 days. Has a knot on her mid-foot that was bruised. Pain radiates to her toes. Denies injury, falls or trauma. No previous injury. She hears some cracking and popping sounds. Pain described as sharp and pressure-like. Pain gets worse in the evening. She does a lot of walking at work. Takes Hydrocodone for her lower back and legs without relief. Took ibuprofen a couple of times.     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   Right arm weakness 07/08/2022   Cervical radiculopathy 07/08/2022   S/P cervical spinal fusion 07/08/2022   Cervical radicular pain 07/24/2021   Spinal stenosis in cervical region 07/24/2021   Chronic pain syndrome 07/24/2021   Cervical spondylosis with radiculopathy 02/25/2021   Mass of soft tissue of upper arm 01/31/2021   Elevated blood-pressure reading, without diagnosis of hypertension 08/26/2020   Menorrhagia with irregular cycle 04/02/2020   Rotator cuff impingement syndrome, left 12/15/2018   Cervical myofascial pain syndrome 02/14/2016   History of breast cancer 11/07/2015   Postlaminectomy syndrome, lumbar region 04/12/2012   Chemotherapy-induced neuropathy (HCC) 04/12/2012   Primary  cancer of lower outer quadrant of right female breast (HCC) 08/07/2011    Past Surgical History:  Procedure Laterality Date   ANTERIOR CERVICAL DECOMP/DISCECTOMY FUSION N/A 07/08/2022   Procedure: C4-7 ANTERIOR CERVICAL DISCECTOMY AND FUSION (GLOBUS HEDRON);  Surgeon: Venetia Night, MD;  Location: [facility];  Service: Neurosurgery;  Laterality: N/A;   BREAST BIOPSY Right 07/2011   invasive ductal carcinoma   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 RAd and chemo tx   BREAST SURGERY     mastectomy Right    partial with lymph node dissection   PORT A CATH REVISION     PORT-A-CATH REMOVAL  11-07-15   Dr Lemar Livings   SHOULDER ARTHROSCOPY WITH ROTATOR CUFF REPAIR Left 05/18/2019   Procedure: SHOULDER ARTHROSCOPY WITH SUBACROMIAL DECOMPRESSION AND DISTAL CLAVICAL EXCISION, INTRAARTICULAR DEBRIDEMENT;  Surgeon: Lyndle Herrlich, MD;  Location: [facility];  Service: Orthopedics;  Laterality: Left;   SPINE SURGERY  2008   Dr Jeral Fruit   TUBAL LIGATION      OB History     Gravida  3   Para      Term      Preterm      AB      Living  3      SAB      IAB      Ectopic      Multiple  Live Births               Home Medications    Prior to Admission medications   Medication Sig Start Date End Date Taking? Authorizing Provider  aspirin EC 81 MG tablet Take 1 tablet (81 mg total) by mouth daily. Swallow whole. 09/16/22  Yes Iran Ouch, MD  atorvastatin (LIPITOR) 20 MG tablet Take 1 tablet (20 mg total) by mouth daily. 09/16/22 09/11/23 Yes Iran Ouch, MD  hydrochlorothiazide (HYDRODIURIL) 12.5 MG tablet TAKE 1 TABLET(12.5 MG) BY MOUTH DAILY 09/15/22  Yes Duanne Limerick, MD  HYDROcodone-acetaminophen (NORCO) 10-325 MG tablet Take 1 tablet by mouth 5 (five) times daily as needed. 12/09/22  Yes Jones Bales, NP  methocarbamol (ROBAXIN) 500 MG tablet TAKE 1 TABLET(500 MG) BY MOUTH FOUR TIMES DAILY 08/04/22  Yes  Susanne Borders, PA  minoxidil (LONITEN) 2.5 MG tablet Take 1.25 mg by mouth 2 (two) times daily. 09/17/21  Yes [provider]  nortriptyline (PAMELOR) 10 MG capsule TAKE 3 CAPSULES(30 MG) BY MOUTH AT BEDTIME Patient taking differently: Take 40 mg by mouth at bedtime. TAKE 4 CAPSULES(40 MG) BY MOUTH AT BEDTIME 06/23/22  Yes Jones Bales, NP  triamcinolone (NASACORT) 55 MCG/ACT AERO nasal inhaler Place 1 spray into the nose 2 (two) times daily. 10/31/22 10/31/23 Yes Cuthriell, Delorise Royals, PA-C  Vitamin D, Ergocalciferol, (DRISDOL) 1.25 MG (50000 UNIT) CAPS capsule Take 50,000 Units by mouth once a week. 09/25/21  Yes [provider]  zolpidem (AMBIEN) 10 MG tablet Take 1 tablet (10 mg total) by mouth at bedtime as needed for sleep. 11/11/22  Yes Jones Bales, NP  butalbital-acetaminophen-caffeine (FIORICET) 50-325-40 MG tablet Take 1 tablet by mouth every 6 (six) hours as needed for headache. Patient not taking: Reported on 12/11/2022 10/31/22 10/31/23  Cuthriell, Delorise Royals, PA-C    Family History Family History  Problem Relation Age of Onset   Diabetes Mother    Hypertension Mother    Pulmonary fibrosis Mother    Heart disease Father    Heart attack Father    Other Father        unknown medical history   Peripheral Artery Disease Father    Breast cancer Paternal Aunt        47'S   Cancer Paternal Uncle    Breast cancer Paternal Uncle        55'S    Social History Social History   Tobacco Use   Smoking status: Every Day    Packs/day: 1.00    Years: 20.00    Additional pack years: 0.00    Total pack years: 20.00    Types: Cigarettes   Smokeless tobacco: Never   Tobacco comments:    0.75PPD 08/31/2022  Vaping Use   Vaping Use: Never used  Substance Use Topics   Alcohol use: Not Currently   Drug use: Never     Allergies   Penicillins, Pregabalin, Sulfa antibiotics, Ondansetron, Other, and Zofran [ondansetron hcl]   Review of Systems Review of  Systems: :negative unless otherwise stated in HPI.      Physical Exam Triage Vital Signs ED Triage Vitals  Enc Vitals Group     BP 01/13/23 1001 (!) 123/94     Pulse Rate 01/13/23 1001 95     Resp 01/13/23 1001 16     Temp 01/13/23 1001 98.7 F (37.1 C)     Temp Source 01/13/23 1001 Oral     SpO2 01/13/23 1001  100 %     Weight 01/13/23 1000 150 lb (68 kg)     Height 01/13/23 1000 5\' 3"  (1.6 m)     Head Circumference --      Peak Flow --      Pain Score 01/13/23 1003 8     Pain Loc --      Pain Edu? --      Excl. in GC? --    No data found.  Updated Vital Signs BP (!) 123/94 (BP Location: Left Arm)   Pulse 95   Temp 98.7 F (37.1 C) (Oral)   Resp 16   Ht 5\' 3"  (1.6 m)   Wt 68 kg   SpO2 100%   BMI 26.57 kg/m   Visual Acuity Right Eye Distance:   Left Eye Distance:   Bilateral Distance:    Right Eye Near:   Left Eye Near:    Bilateral Near:     Physical Exam GEN: well appearing female in no acute distress  CVS: well perfused  RESP: speaking in full sentences without pause, no respiratory distress  MSK:  Ankle/Foot, left: TTP noted across the midfoot with mild edema. No visible erythema, ecchymosis, or bony deformity. Notable pes planus deformity. Transverse arch may be affected as toes are spaced apart.  Range of motion is full in all directions. Strength is 5/5 in all directions. No tenderness at the insertion/body/myotendinous junction of the Achilles tendon; No tenderness on posterior aspects of lateral and medial malleolus; Talar dome tender; Unremarkable calcaneal squeeze; Tenderness over the navicular prominence and over cuboid; No pain at base of 5th MT; No tenderness at the distal metatarsals    UC Treatments / Results  Labs (all labs ordered are listed, but only abnormal results are displayed) Labs Reviewed - No data to display  EKG   Radiology DG Foot Complete Left  Result Date: 01/13/2023 CLINICAL DATA:  Foot pain over the last 10 days. Bump  at the top of the foot. EXAM: LEFT FOOT - COMPLETE 3+ VIEW COMPARISON:  04/02/2020 FINDINGS: No abnormality seen affecting the metatarsals or phalangeal ease. Degenerative spurring is present at the dorsal midfoot, probably related to the cuneiform metatarsal articulation of the great toe. There is overlying soft tissue swelling which is nonspecific. No other focal finding. IMPRESSION: Degenerative spurring at the dorsal midfoot, probably related to the cuneiform metatarsal articulation of the great toe. Overlying soft tissue swelling which is nonspecific. Electronically Signed   By: Paulina Fusi M.D.   On: 01/13/2023 11:32     Procedures Procedures (including critical care time)  Medications Ordered in UC Medications - No data to display  Initial Impression / Assessment and Plan / UC Course  I have reviewed the triage vital signs and the nursing notes.  Pertinent labs & imaging results that were available during my care of the patient were reviewed by me and considered in my medical decision making (see chart for details).      Pt is a 47 y.o.  female with 1-2 weeks of left foot pain pain without known injury.   On exam, pt has tenderness across her midfoot concerning for fracture.   Obtained  left foot pain plain films.  Personally interpreted by me were unremarkable for fracture or dislocation. Does have some bone spurs. Given history of ecchymosis she might have a residual hematoma as she is very sensitive to touch over her midfoot. Pt aware that the radiological reads may take some time to return. She  is comfortable being discharged and following up in MyChart for formal results. Someone will contact her if a change in plan is indicated.  On chart review, she was seen by Dr Ammie Dalton in April 2022 for pes planovalgus and plantar fasciitis.  Patient to gradually return to normal activities, as tolerated and continue ordinary activities within the limits permitted by pain. Continue home  hydrocodone. Voltaren gel and up to 1500 mg Tylenol PRN. Patient to follow up with her podiatrist, if symptoms do not improve with conservative treatment.  Return and ED precautions given. Understanding voiced. Discussed MDM, treatment plan and plan for follow-up with patient who agrees with plan.   Radiologist report reviewed and additionally notes degenerative spurring across the dorsal midfoot and soft tissue swelling.  No change to plan.  Final Clinical Impressions(s) / UC Diagnoses   Final diagnoses:  Foot pain, left     Discharge Instructions      On my review of your xray images, you did not have any fractures or dislocated bone. The radiologist has not yet read your xray. If it is abnormal or urgent, someone will contact you.  You should see your results in MyChart. Follow up with your podiatrist, Dr Nicholes Rough at [facility].   Take up to 1500 mg of Tylenol with your Hydrocodone. Be sure to let your pain management provider know, if you are taking more for your acute foot pain. Apply Voltaren gel for additional pain relief.      ED Prescriptions   None    PDMP not reviewed this encounter.   Katha Cabal, DO 01/13/23 1315

## 2023-01-13 NOTE — Discharge Instructions (Addendum)
On my review of your xray images, you did not have any fractures or dislocated bone. The radiologist has not yet read your xray. If it is abnormal or urgent, someone will contact you.  You should see your results in MyChart. Follow up with your podiatrist, Dr Nicholes Rough at [facility].   Take up to 1500 mg of Tylenol with your Hydrocodone. Be sure to let your pain management provider know, if you are taking more for your acute foot pain. Apply Voltaren gel for additional pain relief.

## 2023-01-13 NOTE — ED Triage Notes (Signed)
Pt c/o knot in L foot x10 days. Denies any injuries. States pain worse when walking.

## 2023-01-13 NOTE — Telephone Encounter (Signed)
P<P was Reviewed. Hydrocodone e-scribed to pharmacy. Elizabeth Torres is aware via My-Chart.  Elizabeth Torres

## 2023-01-15 ENCOUNTER — Ambulatory Visit: Payer: BC Managed Care – PPO

## 2023-01-15 ENCOUNTER — Encounter: Payer: Self-pay | Admitting: Podiatrist

## 2023-01-15 ENCOUNTER — Ambulatory Visit (INDEPENDENT_AMBULATORY_CARE_PROVIDER_SITE_OTHER): Payer: BC Managed Care – PPO | Admitting: Podiatrist

## 2023-01-15 DIAGNOSIS — M67472 Ganglion, left ankle and foot: Secondary | ICD-10-CM

## 2023-01-15 NOTE — Patient Instructions (Signed)
Ganglion Cyst  A ganglion cyst is a non-cancerous, fluid-filled lump of tissue that occurs near a joint, tendon, or ligament. The cyst grows out of a joint or the lining of a tendon or ligament. Ganglion cysts most often develop in the hand or wrist, but they can also develop in the shoulder, elbow, hip, knee, ankle, or foot. Ganglion cysts are ball-shaped or egg-shaped. Their size can range from the size of a pea to larger than a grape. Increased activity may cause the cyst to get bigger because more fluid starts to build up. What are the causes? The exact cause of this condition is not known, but it may be related to: Inflammation or irritation around the joint. An injury or tear in the layers of tissue around the joint (joint capsule). Repetitive movements or overuse. History of acute or repeated injury. What increases the risk? You are more likely to develop this condition if: You are a female. You are 20-40 years old. What are the signs or symptoms? The main symptom of this condition is a lump. It most often appears on the hand or wrist. In many cases, there are no other symptoms, but a cyst can sometimes cause: Tingling. Pain or tenderness. Numbness. Weakness or loss of strength in the affected joint. Decreased range of motion in the affected area of the body. How is this diagnosed? Ganglion cysts are usually diagnosed based on a physical exam. Your health care provider will feel the lump and may shine a light next to it. If it is a ganglion cyst, the light will likely shine through it. Your health care provider may order an X-ray, ultrasound, MRI, or CT scan to rule out other conditions. How is this treated? Ganglion cysts often go away on their own without treatment. If you have pain or other symptoms, treatment may be needed. Treatment is also needed if the ganglion cyst limits your movement or if it gets infected. Treatment may include: Wearing a brace or splint on your wrist or  finger. Taking anti-inflammatory medicine. Having fluid drained from the lump with a needle (aspiration). Getting an injection of medicine into the joint to decrease inflammation. This may be corticosteroids, ethanol, or hyaluronidase. Having surgery to remove the ganglion cyst. Placing a pad in your shoe or wearing shoes that will not rub against the cyst if it is on your foot. Follow these instructions at home: Do not press on the ganglion cyst, poke it with a needle, or hit it. Take over-the-counter and prescription medicines only as told by your health care provider. If you have a brace or splint: Wear it as told by your health care provider. Remove it as told by your health care provider. Ask if you need to remove it when you take a shower or a bath. Watch your ganglion cyst for any changes. Keep all follow-up visits as told by your health care provider. This is important. Contact a health care provider if: Your ganglion cyst becomes larger or more painful. You have pus coming from the lump. You have weakness or numbness in the affected area. You have a fever or chills. Get help right away if: You have a fever and have any of these in the cyst area: Increased redness. Red streaks. Swelling. Summary A ganglion cyst is a non-cancerous, fluid-filled lump that occurs near a joint, tendon, or ligament. Ganglion cysts most often develop in the hand or wrist, but they can also develop in the shoulder, elbow, hip, knee, ankle, or   foot. Ganglion cysts often go away on their own without treatment. This information is not intended to replace advice given to you by your health care provider. Make sure you discuss any questions you have with your health care provider. Document Revised: 10/11/2019 Document Reviewed: 10/11/2019 Elsevier Patient Education  2024 Elsevier Inc[employer]  

## 2023-01-15 NOTE — Progress Notes (Unsigned)
Chief Complaint  Patient presents with   Foot Pain    "I have a knot on the top of my left foot." N - painful knot L - dorsal 2nd and 3rd tarsal area D - 13 days O - suddenly C - bruising, sore A - walking, tennis shoes T - heat, xrays - [facility]     HPI: Patient is 47 y.o. female who presents today for a painful knot like area on the top of her left foot.  She states its near the middle of the left foot and it gets large at the end of the day.  She has noticed it for about 13 days and relates that walking in tennis shoes are uncomfortable.  She was seen to the urgent care and [place] and has been applying heat to the area.  She has noticed no real relief in symptoms.   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   Right arm weakness 07/08/2022   Cervical radiculopathy 07/08/2022   S/P cervical spinal fusion 07/08/2022   Cervical radicular pain 07/24/2021   Spinal stenosis in cervical region 07/24/2021   Chronic pain syndrome 07/24/2021   Cervical spondylosis with radiculopathy 02/25/2021   Mass of soft tissue of upper arm 01/31/2021   Elevated blood-pressure reading, without diagnosis of hypertension 08/26/2020   Menorrhagia with irregular cycle 04/02/2020   Rotator cuff impingement syndrome, left 12/15/2018   Cervical myofascial pain syndrome 02/14/2016   History of breast cancer 11/07/2015   Postlaminectomy syndrome, lumbar region 04/12/2012   Chemotherapy-induced neuropathy (HCC) 04/12/2012   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 (HCC) 08/07/2011    Current Outpatient Medications on File Prior to Visit  Medication Sig Dispense Refill   aspirin EC 81 MG tablet Take 1 tablet (81 mg total) by mouth daily. Swallow whole. 90 tablet 3   atorvastatin (LIPITOR) 20 MG tablet Take 1 tablet (20 mg total) by mouth daily. 90 tablet 3   hydrochlorothiazide (HYDRODIURIL) 12.5 MG tablet TAKE 1  TABLET(12.5 MG) BY MOUTH DAILY 90 tablet 1   HYDROcodone-acetaminophen (NORCO) 10-325 MG tablet Take 1 tablet by mouth 5 (five) times daily as needed. 150 tablet 0   methocarbamol (ROBAXIN) 500 MG tablet TAKE 1 TABLET(500 MG) BY MOUTH FOUR TIMES DAILY 120 tablet 0   minoxidil (LONITEN) 2.5 MG tablet Take 1.25 mg by mouth 2 (two) times daily.     nortriptyline (PAMELOR) 10 MG capsule TAKE 3 CAPSULES(30 MG) BY MOUTH AT BEDTIME (Patient taking differently: Take 40 mg by mouth at bedtime. TAKE 4 CAPSULES(40 MG) BY MOUTH AT BEDTIME) 270 capsule 1   triamcinolone (NASACORT) 55 MCG/ACT AERO nasal inhaler Place 1 spray into the nose 2 (two) times daily. 1 each 2   Vitamin D, Ergocalciferol, (DRISDOL) 1.25 MG (50000 UNIT) CAPS capsule Take 50,000 Units by mouth once a week.     zolpidem (AMBIEN) 10 MG tablet Take 1 tablet (10 mg total) by mouth at bedtime as needed for sleep. 30 tablet 2   butalbital-acetaminophen-caffeine (FIORICET) 50-325-40 MG tablet Take 1 tablet by mouth every 6 (six) hours as needed for headache. (Patient not taking: Reported on 12/11/2022) 10 tablet 0   No current facility-administered medications on file prior to visit.    Allergies  Allergen Reactions   Penicillins Anaphylaxis   Pregabalin Other (See Comments)    Thoughts of self harm   Sulfa Antibiotics Anaphylaxis   Ondansetron  migraines   Other    Zofran [Ondansetron Hcl]     migraines    Review of Systems No fevers, chills, nausea, muscle aches, no difficulty breathing, no calf pain, no chest pain or shortness of breath.   Physical Exam  GENERAL APPEARANCE: Alert, conversant. Appropriately groomed. No acute distress.   VASCULAR: Pedal pulses palpable 2/4 DP and 2/4 PT bilateral.  Capillary refill time is immediate to all digits,  Proximal to distal cooling is warm to warm.  Digital perfusion adequate.   NEUROLOGIC: sensation is intact to 5.07 monofilament at 5/5 sites bilateral.  Light touch is intact  bilateral, vibratory sensation intact bilateral  MUSCULOSKELETAL: acceptable muscle strength, tone and stability bilateral.  Pes planovalgus foot type is noted.  Dorsal spurring at the base of the first metatarsal medial cuneiform is noted bilateral however this is not the area of pain reported.  No crepitus or limitation noted with foot and ankle range of motion bilateral.   DERMATOLOGIC: skin is warm, supple, and dry.  Color, texture, and turgor of skin within normal limits.  No open wounds are noted.    Palpable small cyst is palpated dorsal left foot at the area of the navicular/intermediate cuneiform.  Likely a ganglion on cyst however is not significantly enlarged at the visit today.  X-rays reviewed from urgent care show no sign of fracture or dislocation.  Mild dorsal spurring at the base of the first metatarsal/medial cuneiform is seen.  No other soft tissue abnormalities are noted on x-ray.  Assessment     ICD-10-CM   1. Ganglion cyst of left foot  M67.472 DG Foot Complete Left       Plan  Discussed exam findings and x-ray findings with Elizabeth Torres.  Discussed that it is likely a ganglion cyst due to the nature of the cyst and the location.  At today's visit the cyst is not significantly enlarged therefore I do not feel that aspirating it would be very helpful.  I did recommend an injection of steroid into the cyst to try and shrink the lesion.  Elizabeth Torres agreed and I prepped the skin with alcohol and infiltrated 10 mg of Kenalog with 0.5% Marcaine plain of which she tolerated well.  Instructed her to watch the area and if the lesion fails to improve or gets bigger to please call.  Also recommended she be seen at the end of the day when the cyst is the largest.  She agreed and will call if needed.

## 2023-01-19 ENCOUNTER — Other Ambulatory Visit: Payer: Self-pay | Admitting: Registered Nurse

## 2023-02-05 ENCOUNTER — Encounter: Payer: BC Managed Care – PPO | Attending: Physical Medicine and Rehabilitation | Admitting: Registered Nurse

## 2023-02-05 VITALS — BP 133/81 | HR 100 | Ht 63.0 in | Wt 152.0 lb

## 2023-02-05 DIAGNOSIS — Z5181 Encounter for therapeutic drug level monitoring: Secondary | ICD-10-CM | POA: Diagnosis not present

## 2023-02-05 DIAGNOSIS — M5416 Radiculopathy, lumbar region: Secondary | ICD-10-CM | POA: Insufficient documentation

## 2023-02-05 DIAGNOSIS — G894 Chronic pain syndrome: Secondary | ICD-10-CM | POA: Diagnosis not present

## 2023-02-05 DIAGNOSIS — Z79891 Long term (current) use of opiate analgesic: Secondary | ICD-10-CM | POA: Diagnosis not present

## 2023-02-05 DIAGNOSIS — T451X5A Adverse effect of antineoplastic and immunosuppressive drugs, initial encounter: Secondary | ICD-10-CM | POA: Insufficient documentation

## 2023-02-05 DIAGNOSIS — G620 Drug-induced polyneuropathy: Secondary | ICD-10-CM | POA: Diagnosis not present

## 2023-02-05 MED ORDER — HYDROCODONE-ACETAMINOPHEN 10-325 MG PO TABS: 1.0000 | ORAL_TABLET | Freq: Every day | ORAL | 0 refills | Status: DC | PRN

## 2023-02-05 NOTE — Progress Notes (Signed)
Subjective:    Patient ID: Elizabeth Torres, female    DOB: 1976-02-20, 47 y.o.   MRN: [id number]  HPI: Elizabeth Torres is a 47 y.o. female who returns for follow up appointment for chronic pain and medication refill. She states her pain is located in her lower back pain radiating into her bilateral lower extremities. She rates her pain 4. Her current exercise regime is walking and performing stretching exercises.  [name] equivalent is 50.00  MME.   Last UDS was Performed on 10/15/2022, it was consistent.     Pain Inventory Average Pain 4 Pain Right Now 4 My pain is constant and aching  In the last 24 hours, has pain interfered with the following? General activity 3 Relation with others 3 Enjoyment of life 3 What TIME of day is your pain at its worst? evening Sleep (in general) Fair  Pain is worse with: inactivity, standing, and some activites Pain improves with: rest, heat/ice, and medication Relief from Meds: 7  Family History  Problem Relation Age of Onset   Diabetes Mother    Hypertension Mother    Pulmonary fibrosis Mother    Heart disease Father    Heart attack Father    Other Father        unknown medical history   Peripheral Artery Disease Father    Breast cancer Paternal Aunt        39'S   Cancer Paternal Uncle    Breast cancer Paternal Uncle        66'S   Social History   Socioeconomic History   Marital status: Married    Spouse name: [name]   Number of children: 3   Years of education: Not on file   Highest education level: Not on file  Occupational History   Not on file  Tobacco Use   Smoking status: Every Day    Packs/day: 1.00    Years: 20.00    Additional pack years: 0.00    Total pack years: 20.00    Types: Cigarettes   Smokeless tobacco: Never   Tobacco comments:    0.75PPD 08/31/2022  Vaping Use   Vaping Use: Never used  Substance and Sexual Activity   Alcohol use: Not Currently   Drug use: Never   Sexual activity: Yes     Partners: Male  Other Topics Concern   Not on file  Social History Narrative   Not on file   Social Determinants of Health   Financial Resource Strain: Not on file  Food Insecurity: No Food Insecurity (07/14/2022)   Hunger Vital Sign    Worried About Running Out of Food in the Last Year: Never true    Ran Out of Food in the Last Year: Never true  Transportation Needs: No Transportation Needs (07/14/2022)   PRAPARE - Administrator, Civil Service (Medical): No    Lack of Transportation (Non-Medical): No  Physical Activity: Not on file  Stress: Not on file  Social Connections: Not on file   Past Surgical History:  Procedure Laterality Date   ANTERIOR CERVICAL DECOMP/DISCECTOMY FUSION N/A 07/08/2022   Procedure: C4-7 ANTERIOR CERVICAL DISCECTOMY AND FUSION (GLOBUS HEDRON);  Surgeon: Venetia Night, MD;  Location: [facility];  Service: Neurosurgery;  Laterality: N/A;   BREAST BIOPSY Right 07/2011   invasive ductal carcinoma   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 RAd and chemo tx   BREAST SURGERY  mastectomy Right    partial with lymph node dissection   PORT A CATH REVISION     PORT-A-CATH REMOVAL  11-07-15   Dr Lemar Livings   SHOULDER ARTHROSCOPY WITH ROTATOR CUFF REPAIR Left 05/18/2019   Procedure: SHOULDER ARTHROSCOPY WITH SUBACROMIAL DECOMPRESSION AND DISTAL CLAVICAL EXCISION, INTRAARTICULAR DEBRIDEMENT;  Surgeon: Lyndle Herrlich, MD;  Location: [facility];  Service: Orthopedics;  Laterality: Left;   SPINE SURGERY  2008   Dr Jeral Fruit   TUBAL LIGATION     Past Surgical History:  Procedure Laterality Date   ANTERIOR CERVICAL DECOMP/DISCECTOMY FUSION N/A 07/08/2022   Procedure: C4-7 ANTERIOR CERVICAL DISCECTOMY AND FUSION (GLOBUS HEDRON);  Surgeon: Venetia Night, MD;  Location: [facility];  Service: Neurosurgery;  Laterality: N/A;   BREAST BIOPSY Right 07/2011   invasive ductal carcinoma   BREAST LUMPECTOMY  Right 08/2011   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 RAd and chemo tx   BREAST SURGERY     mastectomy Right    partial with lymph node dissection   PORT A CATH REVISION     PORT-A-CATH REMOVAL  11-07-15   Dr Lemar Livings   SHOULDER ARTHROSCOPY WITH ROTATOR CUFF REPAIR Left 05/18/2019   Procedure: SHOULDER ARTHROSCOPY WITH SUBACROMIAL DECOMPRESSION AND DISTAL CLAVICAL EXCISION, INTRAARTICULAR DEBRIDEMENT;  Surgeon: Lyndle Herrlich, MD;  Location: [facility];  Service: Orthopedics;  Laterality: Left;   SPINE SURGERY  2008   Dr Jeral Fruit   TUBAL LIGATION     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  Opioid Risk Score:   Fall Risk Score:  `1  Depression screen PHQ 2/9     01/07/2023    9:00 AM 11/06/2022   10:19 AM 10/29/2022    7:56 AM 10/15/2022    8:46 AM 09/10/2022    9:14 AM 08/18/2022    9:27 AM 06/16/2022   10:32 AM  Depression screen PHQ 2/9  Decreased Interest 0 0 0 0 0 0 0  Down, Depressed, Hopeless 0 0 0 0 0 0 0  PHQ - 2 Score 0 0 0 0 0 0 0  Altered sleeping  0 0      Tired, decreased energy  0 0      Change in appetite  0 0      Feeling bad or failure about yourself   0 0      Trouble concentrating  0 0      Moving slowly or fidgety/restless  0 0      Suicidal thoughts   0      PHQ-9 Score  0 0      Difficult doing work/chores  Not difficult at all Not difficult at all         Review of Systems  Musculoskeletal:        Bilateral leg pain  All other systems reviewed and are negative.     Objective:   Physical  Exam Vitals and  nursing note reviewed.  Constitutional:      Appearance: Normal appearance.  Cardiovascular:     Rate and Rhythm: Normal rate and regular rhythm.     Pulses: Normal pulses.     Heart sounds: Normal heart sounds.  Pulmonary:     Effort: Pulmonary effort is normal.     Breath sounds: Normal breath sounds.  Musculoskeletal:     Cervical back: Normal range of motion and neck supple.     Comments: Normal Muscle Bulk and Muscle Testing Reveals:  Upper Extremities: Full ROM and Muscle Strength 5/5 Lumbar Paraspinal Tenderness: L-3-L-5 Lower Extremities: Full ROM and Muscle Strength 5/5 Arises from chair with ease Narrow Based  Gait     Skin:    General: Skin is warm and dry.  Neurological:     Mental Status: She is alert and oriented to person, place, and time.  Psychiatric:        Mood and Affect: Mood normal.        Behavior: Behavior normal.         Assessment & Plan:  1. Lumbar postlaminectomy syndrome status post L5-S1 fusion with chronic S1 radiculopathy. Continue current medication regimen with Nortriptyline. 02/05/2023. Refilled :  Hydrocodone 10/325 mg one tablet 5 times a day as needed for pain #150 02/05/2023. We will continue the opioid monitoring program, this consists of regular clinic visits, examinations, urine drug screen, pill counts as well as use of [place] Controlled Substance Reporting system. A 12 month History has been reviewed on the [place] Controlled Substance Reporting System on 02/05/2023. 2. 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 02/05/2023 3. Insomnia: Continue current medication regimen Ambien. 02/05/2023  4.Chronic Left Shoulder Pain: No complaints today: S/P Left Shoulder Arthroscopy with Rotator Cuff Repair on 05/18/2019 by Dr. Aldean Ast : Ortho Following. 02/05/2023 5. Cervicalgia/ Cervical Radiculitis: Continue Pamelor.S/P on  07/08/2022. with Dr Myer Haff:  C4-7 ANTERIOR CERVICAL DISCECTOMY AND FUSION (GLOBUS HEDRON) N/A General   We will continue to monitor.  0705/2024   F/U in 1 month.

## 2023-02-09 ENCOUNTER — Encounter: Payer: Self-pay | Admitting: Registered Nurse

## 2023-03-11 ENCOUNTER — Encounter: Payer: BC Managed Care – PPO | Attending: Physical Medicine and Rehabilitation | Admitting: Registered Nurse

## 2023-03-11 ENCOUNTER — Encounter: Payer: Self-pay | Admitting: Registered Nurse

## 2023-03-11 VITALS — BP 117/80 | HR 91 | Ht 63.0 in | Wt 150.0 lb

## 2023-03-11 DIAGNOSIS — M5416 Radiculopathy, lumbar region: Secondary | ICD-10-CM | POA: Diagnosis not present

## 2023-03-11 DIAGNOSIS — G620 Drug-induced polyneuropathy: Secondary | ICD-10-CM

## 2023-03-11 DIAGNOSIS — G4700 Insomnia, unspecified: Secondary | ICD-10-CM

## 2023-03-11 DIAGNOSIS — T451X5A Adverse effect of antineoplastic and immunosuppressive drugs, initial encounter: Secondary | ICD-10-CM

## 2023-03-11 DIAGNOSIS — Z5181 Encounter for therapeutic drug level monitoring: Secondary | ICD-10-CM | POA: Insufficient documentation

## 2023-03-11 DIAGNOSIS — M961 Postlaminectomy syndrome, not elsewhere classified: Secondary | ICD-10-CM

## 2023-03-11 DIAGNOSIS — M25512 Pain in left shoulder: Secondary | ICD-10-CM

## 2023-03-11 DIAGNOSIS — G894 Chronic pain syndrome: Secondary | ICD-10-CM | POA: Insufficient documentation

## 2023-03-11 DIAGNOSIS — Z79891 Long term (current) use of opiate analgesic: Secondary | ICD-10-CM

## 2023-03-11 DIAGNOSIS — M5412 Radiculopathy, cervical region: Secondary | ICD-10-CM

## 2023-03-11 MED ORDER — HYDROCODONE-ACETAMINOPHEN 10-325 MG PO TABS: 1.0000 | ORAL_TABLET | Freq: Every day | ORAL | 0 refills | Status: DC | PRN
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
## 2023-03-16 ENCOUNTER — Ambulatory Visit: Payer: BC Managed Care – PPO | Admitting: Family Medicine

## 2023-03-30 ENCOUNTER — Other Ambulatory Visit: Payer: Self-pay

## 2023-03-30 DIAGNOSIS — M4722 Other spondylosis with radiculopathy, cervical region: Secondary | ICD-10-CM

## 2023-04-01 ENCOUNTER — Ambulatory Visit: Payer: BC Managed Care – PPO | Admitting: Neurosurgery

## 2023-04-02 ENCOUNTER — Ambulatory Visit
Admission: RE | Admit: 2023-04-02 | Discharge: 2023-04-02 | Disposition: A | Discharge status: Home / Self Care | Payer: BC Managed Care – PPO | Source: Ambulatory Visit | Attending: Neurosurgery | Admitting: Neurosurgery

## 2023-04-02 ENCOUNTER — Ambulatory Visit
Admission: RE | Admit: 2023-04-02 | Discharge: 2023-04-02 | Disposition: A | Discharge status: Home / Self Care | Payer: BC Managed Care – PPO | Source: Home / Self Care | Attending: Neurosurgery | Admitting: Neurosurgery

## 2023-04-02 DIAGNOSIS — M5030 Other cervical disc degeneration, unspecified cervical region: Secondary | ICD-10-CM | POA: Diagnosis not present

## 2023-04-02 DIAGNOSIS — Z981 Arthrodesis status: Secondary | ICD-10-CM | POA: Diagnosis not present

## 2023-04-02 DIAGNOSIS — M47812 Spondylosis without myelopathy or radiculopathy, cervical region: Secondary | ICD-10-CM | POA: Diagnosis not present

## 2023-04-02 DIAGNOSIS — M4722 Other spondylosis with radiculopathy, cervical region: Secondary | ICD-10-CM | POA: Diagnosis not present

## 2023-04-09 ENCOUNTER — Encounter: Payer: Self-pay | Admitting: Registered Nurse

## 2023-04-09 ENCOUNTER — Encounter: Payer: BC Managed Care – PPO | Attending: Physical Medicine and Rehabilitation | Admitting: Registered Nurse

## 2023-04-09 ENCOUNTER — Other Ambulatory Visit: Payer: Self-pay | Admitting: Family Medicine

## 2023-04-09 VITALS — BP 113/81 | HR 97 | Ht 63.0 in | Wt 154.0 lb

## 2023-04-09 DIAGNOSIS — T451X5A Adverse effect of antineoplastic and immunosuppressive drugs, initial encounter: Secondary | ICD-10-CM | POA: Insufficient documentation

## 2023-04-09 DIAGNOSIS — M5416 Radiculopathy, lumbar region: Secondary | ICD-10-CM | POA: Insufficient documentation

## 2023-04-09 DIAGNOSIS — Z5181 Encounter for therapeutic drug level monitoring: Secondary | ICD-10-CM | POA: Insufficient documentation

## 2023-04-09 DIAGNOSIS — G620 Drug-induced polyneuropathy: Secondary | ICD-10-CM | POA: Diagnosis not present

## 2023-04-09 DIAGNOSIS — G894 Chronic pain syndrome: Secondary | ICD-10-CM | POA: Diagnosis not present

## 2023-04-09 DIAGNOSIS — I10 Essential (primary) hypertension: Secondary | ICD-10-CM

## 2023-04-09 DIAGNOSIS — Z79891 Long term (current) use of opiate analgesic: Secondary | ICD-10-CM | POA: Diagnosis not present

## 2023-04-09 MED ORDER — HYDROCODONE-ACETAMINOPHEN 10-325 MG PO TABS: 1.0000 | ORAL_TABLET | Freq: Every day | ORAL | 0 refills | Status: DC | PRN

## 2023-04-09 NOTE — Progress Notes (Signed)
Subjective:    Patient ID: Elizabeth Torres, female    DOB: 1976/07/26, 47 y.o.   MRN: [id number]  HPI: Elizabeth Torres is a 47 y.o. female who returns for follow up appointment for chronic pain and medication refill. She states her pain is located in her lower back radiating into his bilateral lower extremities. She rates her pain 4.Her current exercise regime is walking and performing stretching exercises.  [name] Morphine equivalent is 50.00 MME.   UDS ordered today.    Pain Inventory Average Pain 4 Pain Right Now 4 My pain is constant and aching  In the last 24 hours, has pain interfered with the following? General activity 3 Relation with others 3 Enjoyment of life 3 What TIME of day is your pain at its worst? evening Sleep (in general) Fair  Pain is worse with: standing and some activites Pain improves with: rest and medication Relief from Meds: 5  Family History  Problem Relation Age of Onset   Diabetes Mother    Hypertension Mother    Pulmonary fibrosis Mother    Heart disease Father    Heart attack Father    Other Father        unknown medical history   Peripheral Artery Disease Father    Breast cancer Paternal Aunt        27'S   Cancer Paternal Uncle    Breast cancer Paternal Uncle        10'S   Social History   Socioeconomic History   Marital status: Married    Spouse name: Elberta Calia   Number of children: 3   Years of education: Not on file   Highest education level: Not on file  Occupational History   Not on file  Tobacco Use   Smoking status: Every Day    Current packs/day: 1.00    Average packs/day: 1 pack/day for 20.0 years (20.0 ttl pk-yrs)    Types: Cigarettes   Smokeless tobacco: Never   Tobacco comments:    0.75PPD 08/31/2022  Vaping Use   Vaping status: Never Used  Substance and Sexual Activity   Alcohol use: Not Currently   Drug use: Never   Sexual activity: Yes    Partners: Male  Other Topics Concern   Not on file  Social History  Narrative   Not on file   Social Determinants of Health   Financial Resource Strain: Not on file  Food Insecurity: No Food Insecurity (07/14/2022)   Hunger Vital Sign    Worried About Running Out of Food in the Last Year: Never true    Ran Out of Food in the Last Year: Never true  Transportation Needs: No Transportation Needs (07/14/2022)   PRAPARE - Administrator, Civil Service (Medical): No    Lack of Transportation (Non-Medical): No  Physical Activity: Not on file  Stress: Not on file  Social Connections: Not on file   Past Surgical History:  Procedure Laterality Date   ANTERIOR CERVICAL DECOMP/DISCECTOMY FUSION N/A 07/08/2022   Procedure: C4-7 ANTERIOR CERVICAL DISCECTOMY AND FUSION (GLOBUS HEDRON);  Surgeon: Venetia Night, MD;  Location: [facility];  Service: Neurosurgery;  Laterality: N/A;   BREAST BIOPSY Right 07/2011   invasive ductal carcinoma   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 RAd and chemo tx   BREAST SURGERY     mastectomy Right    partial with lymph node dissection   PORT A CATH  REVISION     PORT-A-CATH REMOVAL  11-07-15   Dr Lemar Livings   SHOULDER ARTHROSCOPY WITH ROTATOR CUFF REPAIR Left 05/18/2019   Procedure: SHOULDER ARTHROSCOPY WITH SUBACROMIAL DECOMPRESSION AND DISTAL CLAVICAL EXCISION, INTRAARTICULAR DEBRIDEMENT;  Surgeon: Lyndle Herrlich, MD;  Location: [facility];  Service: Orthopedics;  Laterality: Left;   SPINE SURGERY  2008   Dr Jeral Fruit   TUBAL LIGATION     Past Surgical History:  Procedure Laterality Date   ANTERIOR CERVICAL DECOMP/DISCECTOMY FUSION N/A 07/08/2022   Procedure: C4-7 ANTERIOR CERVICAL DISCECTOMY AND FUSION (GLOBUS HEDRON);  Surgeon: Venetia Night, MD;  Location: [facility];  Service: Neurosurgery;  Laterality: N/A;   BREAST BIOPSY Right 07/2011   invasive ductal carcinoma   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 RAd  and chemo tx   BREAST SURGERY     mastectomy Right    partial with lymph node dissection   PORT A CATH REVISION     PORT-A-CATH REMOVAL  11-07-15   Dr Lemar Livings   SHOULDER ARTHROSCOPY WITH ROTATOR CUFF REPAIR Left 05/18/2019   Procedure: SHOULDER ARTHROSCOPY WITH SUBACROMIAL DECOMPRESSION AND DISTAL CLAVICAL EXCISION, INTRAARTICULAR DEBRIDEMENT;  Surgeon: Lyndle Herrlich, MD;  Location: [facility];  Service: Orthopedics;  Laterality: Left;   SPINE SURGERY  2008   Dr Jeral Fruit   TUBAL LIGATION     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   Personal history of chemotherapy 2013   for breast ca   Personal history of radiation therapy 2013   F/U right breast cancer    Pneumonia    Radiation 2013   BREAST CA   Status post chemotherapy 2013   BREAST CA   Ht 5\' 3"  (1.6 m)   Wt 154 lb (69.9 kg)   BMI 27.28 kg/m   Opioid Risk Score:   Fall Risk Score:  `1  Depression screen [facility] 2/9     03/11/2023   10:10 AM 02/05/2023    9:45 AM 01/07/2023    9:00 AM 11/06/2022   10:19 AM 10/29/2022    7:56 AM 10/15/2022    8:46 AM 09/10/2022    9:14 AM  Depression screen PHQ 2/9  Decreased Interest 0 0 0 0 0 0 0  Down, Depressed, Hopeless 0 0 0 0 0 0 0  PHQ - 2 Score 0 0 0 0 0 0 0  Altered sleeping    0 0    Tired, decreased energy    0 0    Change in appetite    0 0    Feeling bad or failure about yourself     0 0    Trouble concentrating    0 0    Moving slowly or fidgety/restless    0 0    Suicidal thoughts     0    PHQ-9 Score    0 0    Difficult doing work/chores    Not difficult at all Not difficult at all      Review of Systems  Musculoskeletal:  Positive for gait problem.       Pain in both legs below buttocks down to feet   All other systems reviewed and are negative.      Objective:  Physical  Exam Vitals and nursing note reviewed.  Constitutional:      Appearance: Normal appearance.  Cardiovascular:     Rate and Rhythm: Normal rate and regular rhythm.     Pulses: Normal pulses.     Heart sounds: Normal heart sounds.  Pulmonary:     Effort: Pulmonary effort is normal.     Breath sounds: Normal breath sounds.  Musculoskeletal:     Cervical back: Normal range of motion and neck supple.     Comments: Normal Muscle Bulk and Muscle Testing Reveals:  Upper Extremities: Full ROM and Muscle Strength 5/5  Lower Extremities: Full ROM and Muscle Strength 5/5 Arises from chair with ease Narrow based  Gait     Skin:    General: Skin is warm and dry.  Neurological:     Mental Status: She is alert and oriented to person, place, and time.  Psychiatric:        Mood and Affect: Mood normal.        Behavior: Behavior normal.         Assessment & Plan:  1. Lumbar postlaminectomy syndrome status post L5-S1 fusion with chronic S1 radiculopathy. Continue current medication regimen with Nortriptyline. 04/09/2023. Refilled :  Hydrocodone 10/325 mg one tablet 5 times a day as needed for pain #150 04/09/2023. We will continue the opioid monitoring program, this consists of regular clinic visits, examinations, urine drug screen, pill counts as well as use of [place] Controlled Substance Reporting system. A 12 month History has been reviewed on the [place] Controlled Substance Reporting System on 04/09/2023. 2. 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 04/09/2023 3. Insomnia: Continue current medication regimen Ambien. 04/09/2023  4.Chronic Left Shoulder Pain: No complaints today: S/P Left Shoulder Arthroscopy with Rotator Cuff Repair on 05/18/2019 by Dr. Aldean Ast : Ortho Following. 04/09/2023 5. Cervicalgia/ Cervical Radiculitis: Continue Pamelor.S/P on 07/08/2022. with Dr Myer Haff:  C4-7  ANTERIOR CERVICAL DISCECTOMY AND FUSION (GLOBUS HEDRON) N/A General   We will continue to monitor.  04/09/2023   F/U in 1 month.

## 2023-04-09 NOTE — Telephone Encounter (Signed)
Requested Prescriptions  Pending Prescriptions Disp Refills   hydrochlorothiazide (HYDRODIURIL) 12.5 MG tablet [Pharmacy Med Name: HYDROCHLOROT TAB 12.5MG ] 90 tablet 0    Sig: TAKE (1) TABLET BY MOUTH EVERY DAY     Cardiovascular: Diuretics - Thiazide Failed - 04/09/2023  9:22 AM      Failed - Cr in normal range and within 180 days    Creatinine  Date Value Ref Range Status  02/22/2012 0.83 0.60 - 1.30 mg/dL Final   Creatinine, Ser  Date Value Ref Range Status  06/29/2022 0.65 0.44 - 1.00 mg/dL Final   Creatinine, Urine  Date Value Ref Range Status  04/17/2015 12.15 (L) >20.0 mg/dL Final         Failed - K in normal range and within 180 days    Potassium  Date Value Ref Range Status  06/29/2022 4.3 3.5 - 5.1 mmol/L Final  02/22/2012 4.2 3.5 - 5.1 mmol/L Final         Failed - Na in normal range and within 180 days    Sodium  Date Value Ref Range Status  06/29/2022 136 135 - 145 mmol/L Final  01/20/2022 138 134 - 144 mmol/L Final  02/22/2012 141 136 - 145 mmol/L Final         Passed - Last BP in normal range    BP Readings from Last 1 Encounters:  04/09/23 113/81         Passed - Valid encounter within last 6 months    Recent Outpatient Visits           5 months ago Chronic pansinusitis   [place] [facility]   5 months ago Acute maxillary sinusitis, recurrence not specified   [place] [facility]   6 months ago Fever and chills   [place] [facility]   1 year ago Cervical radicular pain   [place] [facility]   1 year ago Yeast vaginitis    [facility], [facility]       Future Appointments             In 2 months Iran Ouch, MD [facility]  at [facility]

## 2023-04-20 ENCOUNTER — Ambulatory Visit (INDEPENDENT_AMBULATORY_CARE_PROVIDER_SITE_OTHER): Payer: BC Managed Care – PPO | Admitting: Neurosurgery

## 2023-04-20 ENCOUNTER — Encounter: Payer: Self-pay | Admitting: Neurosurgery

## 2023-04-20 VITALS — BP 112/74 | Ht 63.0 in | Wt 154.0 lb

## 2023-04-20 DIAGNOSIS — Z09 Encounter for follow-up examination after completed treatment for conditions other than malignant neoplasm: Secondary | ICD-10-CM

## 2023-04-20 DIAGNOSIS — M4722 Other spondylosis with radiculopathy, cervical region: Secondary | ICD-10-CM | POA: Diagnosis not present

## 2023-04-20 NOTE — Progress Notes (Signed)
   REFERRING PHYSICIAN:  Duanne Limerick, Md [address] [place],  [place] [address]  DOS: 07/08/22  ACDF C4-C7  HISTORY OF PRESENT ILLNESS: Elizabeth Torres is status post ACDF C4-C7.  She is doing well.  Her arm pain is much improved.    She is not having any issues.  PHYSICAL EXAMINATION:  NEUROLOGICAL:  General: In no acute distress.   Awake, alert, oriented to person, place, and time.  Pupils equal round and reactive to light.  Facial tone is symmetric.    Strength: Side Biceps Triceps Deltoid Interossei Grip Wrist Ext. Wrist Flex.  R 5 5 5 5 5 5 5   L 5 5 5 5 5 5 5    Incision c/d/I with small amount of scabbing  Imaging:  No complications noted  Assessment / Plan: Elizabeth Torres is doing well s/p above surgery.    I am very pleased with her response to surgery.  I will see her back on an as-needed basis.  We discussed smoking cessation.  I think she should strongly consider this.  She seems to be considering it, but will discuss with her pulmonologist and primary care doctor.  I spent a total of 10 minutes in this patient's care today. This time was spent reviewing pertinent records including imaging studies, obtaining and confirming history, performing a directed evaluation, formulating and discussing my recommendations, and documenting the visit within the medical record.   Venetia Night MD Dept of Neurosurgery

## 2023-04-23 DIAGNOSIS — G894 Chronic pain syndrome: Secondary | ICD-10-CM | POA: Diagnosis not present

## 2023-04-23 DIAGNOSIS — Z5181 Encounter for therapeutic drug level monitoring: Secondary | ICD-10-CM | POA: Diagnosis not present

## 2023-04-23 DIAGNOSIS — Z79891 Long term (current) use of opiate analgesic: Secondary | ICD-10-CM | POA: Diagnosis not present

## 2023-04-27 LAB — TOXASSURE SELECT,+ANTIDEPR,UR

## 2023-05-04 ENCOUNTER — Encounter: Payer: BC Managed Care – PPO | Attending: Physical Medicine and Rehabilitation | Admitting: Registered Nurse

## 2023-05-04 ENCOUNTER — Encounter: Payer: Self-pay | Admitting: Registered Nurse

## 2023-05-04 VITALS — BP 127/79 | HR 99 | Ht 63.0 in | Wt 154.0 lb

## 2023-05-04 DIAGNOSIS — T451X5A Adverse effect of antineoplastic and immunosuppressive drugs, initial encounter: Secondary | ICD-10-CM | POA: Insufficient documentation

## 2023-05-04 DIAGNOSIS — G4700 Insomnia, unspecified: Secondary | ICD-10-CM | POA: Diagnosis not present

## 2023-05-04 DIAGNOSIS — G620 Drug-induced polyneuropathy: Secondary | ICD-10-CM | POA: Diagnosis not present

## 2023-05-04 DIAGNOSIS — Z79891 Long term (current) use of opiate analgesic: Secondary | ICD-10-CM | POA: Diagnosis not present

## 2023-05-04 DIAGNOSIS — M5416 Radiculopathy, lumbar region: Secondary | ICD-10-CM | POA: Insufficient documentation

## 2023-05-04 DIAGNOSIS — G894 Chronic pain syndrome: Secondary | ICD-10-CM | POA: Diagnosis not present

## 2023-05-04 DIAGNOSIS — Z5181 Encounter for therapeutic drug level monitoring: Secondary | ICD-10-CM | POA: Insufficient documentation

## 2023-05-04 MED ORDER — HYDROCODONE-ACETAMINOPHEN 10-325 MG PO TABS: 1.0000 | ORAL_TABLET | Freq: Every day | ORAL | 0 refills | Status: DC | PRN

## 2023-05-04 MED ORDER — ZOLPIDEM TARTRATE 10 MG PO TABS: 10.0000 mg | ORAL_TABLET | Freq: Every evening | ORAL | 2 refills | Status: DC | PRN

## 2023-05-04 NOTE — Progress Notes (Signed)
Subjective:    Patient ID: Elizabeth Torres, female    DOB: 03-01-1976, 47 y.o.   MRN: [id number]  HPI: Elizabeth Torres is a 47 y.o. female who returns for follow up appointment for chronic pain and medication refill. She states her pain is located in her lower back radiating into her bilateral lower extremities. She rates her  pain 4. Her current exercise regime is walking and performing stretching exercises.  Elizabeth Torres Morphine equivalent is 50.00 MME.   Last UDS was Performed on 04/23/2023, it was consistent.    Pain Inventory Average Pain 4 Pain Right Now 4 My pain is constant and aching  In the last 24 hours, has pain interfered with the following? General activity 3 Relation with others 3 Enjoyment of life 3 What TIME of day is your pain at its worst? evening Sleep (in general) Fair  Pain is worse with: walking, bending, standing, and some activites Pain improves with: rest and medication Relief from Meds: 5  Family History  Problem Relation Age of Onset   Diabetes Mother    Hypertension Mother    Pulmonary fibrosis Mother    Heart disease Father    Heart attack Father    Other Father        unknown medical history   Peripheral Artery Disease Father    Breast cancer Paternal Aunt        26'S   Cancer Paternal Uncle    Breast cancer Paternal Uncle        63'S   Social History   Socioeconomic History   Marital status: Married    Spouse name: Presley Thoms   Number of children: 3   Years of education: Not on file   Highest education level: Not on file  Occupational History   Not on file  Tobacco Use   Smoking status: Every Day    Current packs/day: 1.00    Average packs/day: 1 pack/day for 20.0 years (20.0 ttl pk-yrs)    Types: Cigarettes   Smokeless tobacco: Never   Tobacco comments:    0.75PPD 08/31/2022  Vaping Use   Vaping status: Never Used  Substance and Sexual Activity   Alcohol use: Not Currently   Drug use: Never   Sexual activity: Yes    Partners:  Male  Other Topics Concern   Not on file  Social History Narrative   Not on file   Social Determinants of Health   Financial Resource Strain: Not on file  Food Insecurity: No Food Insecurity (07/14/2022)   Hunger Vital Sign    Worried About Running Out of Food in the Last Year: Never true    Ran Out of Food in the Last Year: Never true  Transportation Needs: No Transportation Needs (07/14/2022)   PRAPARE - Administrator, Civil Service (Medical): No    Lack of Transportation (Non-Medical): No  Physical Activity: Not on file  Stress: Not on file  Social Connections: Not on file   Past Surgical History:  Procedure Laterality Date   ANTERIOR CERVICAL DECOMP/DISCECTOMY FUSION N/A 07/08/2022   Procedure: C4-7 ANTERIOR CERVICAL DISCECTOMY AND FUSION (GLOBUS HEDRON);  Surgeon: Venetia Night, MD;  Location: [facility];  Service: Neurosurgery;  Laterality: N/A;   BREAST BIOPSY Right 07/2011   invasive ductal carcinoma   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 RAd and chemo tx   BREAST SURGERY     mastectomy Right  partial with lymph node dissection   PORT A CATH REVISION     PORT-A-CATH REMOVAL  11-07-15   Dr Lemar Livings   SHOULDER ARTHROSCOPY WITH ROTATOR CUFF REPAIR Left 05/18/2019   Procedure: SHOULDER ARTHROSCOPY WITH SUBACROMIAL DECOMPRESSION AND DISTAL CLAVICAL EXCISION, INTRAARTICULAR DEBRIDEMENT;  Surgeon: Lyndle Herrlich, MD;  Location: [facility];  Service: Orthopedics;  Laterality: Left;   SPINE SURGERY  2008   Dr Jeral Fruit   TUBAL LIGATION     Past Surgical History:  Procedure Laterality Date   ANTERIOR CERVICAL DECOMP/DISCECTOMY FUSION N/A 07/08/2022   Procedure: C4-7 ANTERIOR CERVICAL DISCECTOMY AND FUSION (GLOBUS HEDRON);  Surgeon: Venetia Night, MD;  Location: [facility];  Service: Neurosurgery;  Laterality: N/A;   BREAST BIOPSY Right 07/2011   invasive ductal carcinoma   BREAST LUMPECTOMY Right  08/2011   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 RAd and chemo tx   BREAST SURGERY     mastectomy Right    partial with lymph node dissection   PORT A CATH REVISION     PORT-A-CATH REMOVAL  11-07-15   Dr Lemar Livings   SHOULDER ARTHROSCOPY WITH ROTATOR CUFF REPAIR Left 05/18/2019   Procedure: SHOULDER ARTHROSCOPY WITH SUBACROMIAL DECOMPRESSION AND DISTAL CLAVICAL EXCISION, INTRAARTICULAR DEBRIDEMENT;  Surgeon: Lyndle Herrlich, MD;  Location: [facility];  Service: Orthopedics;  Laterality: Left;   SPINE SURGERY  2008   Dr Jeral Fruit   TUBAL LIGATION     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   Personal history of chemotherapy 2013   for breast ca   Personal history of radiation therapy 2013   F/U right breast cancer    Pneumonia    Radiation 2013   BREAST CA   Status post chemotherapy 2013   BREAST CA   BP 127/79   Pulse 99   Ht 5\' 3"  (1.6 m)   Wt 154 lb (69.9 kg)   SpO2 97%   BMI 27.28 kg/m   Opioid Risk Score:   Fall Risk Score:  `1  Depression screen [facility] 2/9     04/09/2023    8:45 AM 03/11/2023   10:10 AM 02/05/2023    9:45 AM 01/07/2023    9:00 AM 11/06/2022   10:19 AM 10/29/2022    7:56 AM 10/15/2022    8:46 AM  Depression screen PHQ 2/9  Decreased Interest 0 0 0 0 0 0 0  Down, Depressed, Hopeless 0 0 0 0 0 0 0  PHQ - 2 Score 0 0 0 0 0 0 0  Altered sleeping     0 0   Tired, decreased energy     0 0   Change in appetite     0 0   Feeling bad or failure about yourself      0 0   Trouble concentrating     0 0   Moving slowly or fidgety/restless     0 0   Suicidal thoughts      0   PHQ-9 Score     0 0   Difficult doing work/chores     Not difficult at all Not difficult at all     Review of Systems  Musculoskeletal:  Positive for back pain.       Bilateral leg pain  All other  systems reviewed and are negative.      Objective:   Physical Exam Vitals and nursing note reviewed.  Constitutional:      Appearance: Normal appearance.  Cardiovascular:     Rate and Rhythm: Normal rate and regular rhythm.     Pulses: Normal pulses.     Heart sounds: Normal heart sounds.  Pulmonary:     Effort: Pulmonary effort is normal.     Breath sounds: Normal breath sounds.  Musculoskeletal:     Cervical back: Normal range of motion and neck supple.     Comments: Normal Muscle Bulk and Muscle Testing Reveals:  Upper Extremities: Full ROM and Muscle Strength 5/5 Lower Extremities: Full ROM and Muscle Strength 5/5  Arises from the chair with ease  Narrow Based  Gait     Skin:    General: Skin is warm and dry.  Neurological:     Mental Status: She is alert and oriented to person, place, and time.  Psychiatric:        Mood and Affect: Mood normal.        Behavior: Behavior normal.         Assessment & Plan:  1. Lumbar postlaminectomy syndrome status post L5-S1 fusion with chronic S1 radiculopathy. Continue current medication regimen with Nortriptyline. 05/04/2023. Refilled :  Hydrocodone 10/325 mg one tablet 5 times a day as needed for pain #150 04/09/2023. We will continue the opioid monitoring program, this consists of regular clinic visits, examinations, urine drug screen, pill counts as well as use of [place] Controlled Substance Reporting system. A 12 month History has been reviewed on the [place] Controlled Substance Reporting System on 05/04/2023. 2. 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 05/04/2023 3. Insomnia: Continue current medication regimen Ambien. 05/04/2023  4.Chronic Left Shoulder Pain: No complaints today: S/P Left Shoulder Arthroscopy with Rotator Cuff Repair on 05/18/2019 by Dr. Aldean Ast : Ortho Following. 05/04/2023 5. Cervicalgia/ Cervical  Radiculitis: Continue Pamelor.S/P on 07/08/2022. with Dr Myer Haff:  C4-7 ANTERIOR CERVICAL DISCECTOMY AND FUSION (GLOBUS HEDRON) N/A General   We will continue to monitor.  05/04/2023   F/U in 1 month.

## 2023-06-03 ENCOUNTER — Encounter: Payer: Self-pay | Admitting: Registered Nurse

## 2023-06-03 ENCOUNTER — Encounter: Payer: BC Managed Care – PPO | Admitting: Registered Nurse

## 2023-06-03 VITALS — BP 118/84 | HR 92 | Ht 63.0 in | Wt 156.0 lb

## 2023-06-03 DIAGNOSIS — M5416 Radiculopathy, lumbar region: Secondary | ICD-10-CM

## 2023-06-03 DIAGNOSIS — G4700 Insomnia, unspecified: Secondary | ICD-10-CM

## 2023-06-03 DIAGNOSIS — Z79891 Long term (current) use of opiate analgesic: Secondary | ICD-10-CM

## 2023-06-03 DIAGNOSIS — T451X5A Adverse effect of antineoplastic and immunosuppressive drugs, initial encounter: Secondary | ICD-10-CM

## 2023-06-03 DIAGNOSIS — Z5181 Encounter for therapeutic drug level monitoring: Secondary | ICD-10-CM

## 2023-06-03 DIAGNOSIS — G620 Drug-induced polyneuropathy: Secondary | ICD-10-CM

## 2023-06-03 DIAGNOSIS — G894 Chronic pain syndrome: Secondary | ICD-10-CM | POA: Diagnosis not present

## 2023-06-03 MED ORDER — HYDROCODONE-ACETAMINOPHEN 10-325 MG PO TABS: 1.0000 | ORAL_TABLET | Freq: Every day | ORAL | 0 refills | Status: DC | PRN

## 2023-06-03 NOTE — Progress Notes (Signed)
Subjective:    Patient ID: Elizabeth Torres, female    DOB: [birth year], 47 y.o.   MRN: [id number]  HPI: Elizabeth Torres is a 47 y.o. female who returns for follow up appointment for chronic pain and medication refill. She states her pain is located in her lower back radiating into her bilateral lower extremities. She rates her pain 4. Her current exercise regime is walking and performing stretching exercises.  Ms. Fross Morphine equivalent is 50.00 MME.   Last UDS was Performed on 04/23/2023, it was consistent.     Pain Inventory Average Pain 4 Pain Right Now 4 My pain is constant and aching  In the last 24 hours, has pain interfered with the following? General activity 3 Relation with others 3 Enjoyment of life 3 What TIME of day is your pain at its worst? evening Sleep (in general) Fair  Pain is worse with: walking, standing, and some activites Pain improves with: rest and medication Relief from Meds: 5  Family History  Problem Relation Age of Onset   Diabetes Mother    Hypertension Mother    Pulmonary fibrosis Mother    Heart disease Father    Heart attack Father    Other Father        unknown medical history   Peripheral Artery Disease Father    Breast cancer Paternal Aunt        30'S   Cancer Paternal Uncle    Breast cancer Paternal Uncle        57'S   Social History   Socioeconomic History   Marital status: Married    Spouse name: Lakeitha Hobbs   Number of children: 3   Years of education: Not on file   Highest education level: Not on file  Occupational History   Not on file  Tobacco Use   Smoking status: Every Day    Current packs/day: 1.00    Average packs/day: 1 pack/day for 20.0 years (20.0 ttl pk-yrs)    Types: Cigarettes   Smokeless tobacco: Never   Tobacco comments:    0.75PPD 08/31/2022  Vaping Use   Vaping status: Never Used  Substance and Sexual Activity   Alcohol use: Not Currently   Drug use: Never   Sexual activity: Yes    Partners: Male   Other Topics Concern   Not on file  Social History Narrative   Not on file   Social Determinants of Health   Financial Resource Strain: Not on file  Food Insecurity: No Food Insecurity (07/14/2022)   Hunger Vital Sign    Worried About Running Out of Food in the Last Year: Never true    Ran Out of Food in the Last Year: Never true  Transportation Needs: No Transportation Needs (07/14/2022)   PRAPARE - Administrator, Civil Service (Medical): No    Lack of Transportation (Non-Medical): No  Physical Activity: Not on file  Stress: Not on file  Social Connections: Not on file   Past Surgical History:  Procedure Laterality Date   ANTERIOR CERVICAL DECOMP/DISCECTOMY FUSION N/A 07/08/2022   Procedure: C4-7 ANTERIOR CERVICAL DISCECTOMY AND FUSION (GLOBUS HEDRON);  Surgeon: Venetia Night, MD;  Location: [facility];  Service: Neurosurgery;  Laterality: N/A;   BREAST BIOPSY Right 07/2011   invasive ductal carcinoma   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 RAd and chemo tx   BREAST SURGERY     mastectomy Right    partial  with lymph node dissection   PORT A CATH REVISION     PORT-A-CATH REMOVAL  11-07-15   Dr Lemar Livings   SHOULDER ARTHROSCOPY WITH ROTATOR CUFF REPAIR Left 05/18/2019   Procedure: SHOULDER ARTHROSCOPY WITH SUBACROMIAL DECOMPRESSION AND DISTAL CLAVICAL EXCISION, INTRAARTICULAR DEBRIDEMENT;  Surgeon: Lyndle Herrlich, MD;  Location: [facility];  Service: Orthopedics;  Laterality: Left;   SPINE SURGERY  2008   Dr Jeral Fruit   TUBAL LIGATION     Past Surgical History:  Procedure Laterality Date   ANTERIOR CERVICAL DECOMP/DISCECTOMY FUSION N/A 07/08/2022   Procedure: C4-7 ANTERIOR CERVICAL DISCECTOMY AND FUSION (GLOBUS HEDRON);  Surgeon: Venetia Night, MD;  Location: [facility];  Service: Neurosurgery;  Laterality: N/A;   BREAST BIOPSY Right 07/2011   invasive ductal carcinoma   BREAST LUMPECTOMY Right 08/2011    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 RAd and chemo tx   BREAST SURGERY     mastectomy Right    partial with lymph node dissection   PORT A CATH REVISION     PORT-A-CATH REMOVAL  11-07-15   Dr Lemar Livings   SHOULDER ARTHROSCOPY WITH ROTATOR CUFF REPAIR Left 05/18/2019   Procedure: SHOULDER ARTHROSCOPY WITH SUBACROMIAL DECOMPRESSION AND DISTAL CLAVICAL EXCISION, INTRAARTICULAR DEBRIDEMENT;  Surgeon: Lyndle Herrlich, MD;  Location: [facility];  Service: Orthopedics;  Laterality: Left;   SPINE SURGERY  2008   Dr Jeral Fruit   TUBAL LIGATION     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  Opioid Risk Score:   Fall Risk Score:  `1  Depression screen [facility] 2/9     04/09/2023    8:45 AM 03/11/2023   10:10 AM 02/05/2023    9:45 AM 01/07/2023    9:00 AM 11/06/2022   10:19 AM 10/29/2022    7:56 AM 10/15/2022    8:46 AM  Depression screen PHQ 2/9  Decreased Interest 0 0 0 0 0 0 0  Down, Depressed, Hopeless 0 0 0 0 0 0 0  PHQ - 2 Score 0 0 0 0 0 0 0  Altered sleeping     0 0   Tired, decreased energy     0 0   Change in appetite     0 0   Feeling bad or failure about yourself      0 0   Trouble concentrating     0 0   Moving slowly or fidgety/restless     0 0   Suicidal thoughts      0   PHQ-9 Score     0 0   Difficult doing work/chores     Not difficult at all Not difficult at all      Review of Systems  Musculoskeletal:  Positive for back pain.       Pain both upper legs down to both feet  All other systems reviewed and are negative.       Objective:  Physical Exam Vitals and nursing note reviewed.  Constitutional:      Appearance: Normal appearance.  Cardiovascular:     Rate and Rhythm: Normal rate and regular rhythm.     Pulses: Normal pulses.     Heart sounds: Normal heart sounds.  Pulmonary:     Effort: Pulmonary effort is normal.     Breath sounds: Normal breath sounds.  Musculoskeletal:     Cervical back: Normal range of motion and neck supple.     Comments: Normal Muscle Bulk and Muscle Testing Reveals:  Upper Extremities: Full ROM and Muscle Strength 5/5  Lower Extremities: Full ROM and Muscle Strength 5/5 Arises from chair with ease Narrow Based  Gait     Skin:    General: Skin is warm and dry.  Neurological:     Mental Status: She is alert and oriented to person, place, and time.  Psychiatric:        Mood and Affect: Mood normal.        Behavior: Behavior normal.         Assessment & Plan:  1. Lumbar postlaminectomy syndrome status post L5-S1 fusion with chronic S1 radiculopathy. Continue current medication regimen with Nortriptyline. 06/03/2023. Refilled :  Hydrocodone 10/325 mg one tablet 5 times a day as needed for pain #150 04/09/2023. We will continue the opioid monitoring program, this consists of regular clinic visits, examinations, urine drug screen, pill counts as well as use of [place] Controlled Substance Reporting system. A 12 month History has been reviewed on the [place] Controlled Substance Reporting System on 06/03/2023. 2. 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 06/03/2023 3. Insomnia: Continue current medication regimen Ambien. 06/03/2023  4.Chronic Left Shoulder Pain: No complaints today: S/P Left Shoulder Arthroscopy with Rotator Cuff Repair on 05/18/2019 by Dr. Aldean Ast : Ortho Following. 06/03/2023 5. Cervicalgia/ Cervical Radiculitis: Continue Pamelor.S/P on 07/08/2022. with  Dr Myer Haff:  C4-7 ANTERIOR CERVICAL DISCECTOMY AND FUSION (GLOBUS HEDRON) N/A General   We will continue to monitor.  06/03/2023   F/U in 1 month.

## 2023-06-07 ENCOUNTER — Telehealth: Payer: Self-pay | Admitting: Registered Nurse

## 2023-06-07 MED ORDER — NORTRIPTYLINE HCL 10 MG PO CAPS: ORAL_CAPSULE | ORAL | 1 refills | Status: DC

## 2023-06-07 NOTE — Telephone Encounter (Signed)
Nortriptyline prescription sent to pharmacy.  Elizabeth Torres is aware via My-Chart.

## 2023-06-17 ENCOUNTER — Encounter: Payer: Self-pay | Admitting: Cardiovascular Disease

## 2023-06-17 ENCOUNTER — Ambulatory Visit: Payer: BC Managed Care – PPO | Attending: Cardiovascular Disease | Admitting: Cardiovascular Disease

## 2023-06-17 VITALS — BP 106/70 | HR 84 | Ht 63.0 in | Wt 156.2 lb

## 2023-06-17 DIAGNOSIS — E785 Hyperlipidemia, unspecified: Secondary | ICD-10-CM | POA: Diagnosis not present

## 2023-06-17 DIAGNOSIS — I2510 Atherosclerotic heart disease of native coronary artery without angina pectoris: Secondary | ICD-10-CM

## 2023-06-17 DIAGNOSIS — Z720 Tobacco use: Secondary | ICD-10-CM | POA: Diagnosis not present

## 2023-06-17 NOTE — Progress Notes (Signed)
Cardiology Office Note   Date:  06/17/2023   ID:  Elizabeth Torres, DOB 1976/03/05, MRN [id number]  PCP:  Duanne Limerick, MD  Cardiologist:   Lorine Bears, MD   Chief Complaint  Patient presents with   Follow-up    6 month f/u c/o chest discomfort one day last week. Meds reviewed verbally with pt.      History of Present Illness: Elizabeth Torres is a 47 y.o. female who is here today for a follow-up visit regarding coronary and aortic atherosclerosis.  She has known history of essential hypertension, hyperlipidemia, breast cancer status post lumpectomy followed by radiation and chemotherapy and tobacco use. She is known to have mild COPD.  CT scan of the chest showed three-vessel coronary artery calcifications and aortic calcifications. She underwent a treadmill nuclear stress test in February 2024 which showed no evidence of ischemia with normal ejection fraction.  Echocardiogram in May 2024 showed normal LV systolic function with no significant valvular abnormalities.  She had 1 episode of chest pain last week that was in the epigastric and lower chest area and was described as sharp pain that resolved without intervention with no recurrent symptoms.  She is otherwise doing well.  She cut down on tobacco use to half a pack per day.  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   Personal history of chemotherapy 2013   for breast ca   Personal history of radiation therapy 2013   F/U right breast cancer    Pneumonia    Radiation 2013   BREAST CA   Status post chemotherapy 2013   BREAST CA    Past Surgical History:  Procedure Laterality Date   ANTERIOR CERVICAL DECOMP/DISCECTOMY FUSION N/A 07/08/2022   Procedure: C4-7 ANTERIOR CERVICAL DISCECTOMY AND FUSION (GLOBUS HEDRON);  Surgeon: Venetia Night, MD;   Location: [facility] ORS;  Service: Neurosurgery;  Laterality: N/A;   BREAST BIOPSY Right 07/2011   invasive ductal carcinoma   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 RAd and chemo tx   BREAST SURGERY     mastectomy Right    partial with lymph node dissection   PORT A CATH REVISION     PORT-A-CATH REMOVAL  11-07-15   Dr Lemar Livings   SHOULDER ARTHROSCOPY WITH ROTATOR CUFF REPAIR Left 05/18/2019   Procedure: SHOULDER ARTHROSCOPY WITH SUBACROMIAL DECOMPRESSION AND DISTAL CLAVICAL EXCISION, INTRAARTICULAR DEBRIDEMENT;  Surgeon: Lyndle Herrlich, MD;  Location: [facility];  Service: Orthopedics;  Laterality: Left;   SPINE SURGERY  2008   Dr Jeral Fruit   TUBAL LIGATION       Current Outpatient Medications  Medication Sig Dispense Refill   aspirin EC 81 MG tablet Take 1 tablet (81 mg total) by mouth daily. Swallow whole. 90 tablet 3   atorvastatin (LIPITOR) 20 MG tablet Take 1 tablet (20 mg total) by mouth daily. 90 tablet 3   hydrochlorothiazide (HYDRODIURIL) 12.5 MG tablet TAKE (1) TABLET BY MOUTH EVERY DAY 90 tablet 0   HYDROcodone-acetaminophen (NORCO) 10-325 MG tablet Take 1 tablet by mouth 5 (five) times daily as needed. 150 tablet 0   methocarbamol (ROBAXIN) 500 MG tablet TAKE 1 TABLET(500 MG) BY MOUTH FOUR TIMES DAILY 120 tablet 0   minoxidil (LONITEN) 2.5  MG tablet Take 1.25 mg by mouth 2 (two) times daily.     nortriptyline (PAMELOR) 10 MG capsule TAKE (3) CAPSULES AT BEDTIME AS DIRECTED 270 capsule 1   Vitamin D, Ergocalciferol, (DRISDOL) 1.25 MG (50000 UNIT) CAPS capsule Take 50,000 Units by mouth once a week.     zolpidem (AMBIEN) 10 MG tablet Take 1 tablet (10 mg total) by mouth at bedtime as needed for sleep. 30 tablet 2   No current facility-administered medications for this visit.    Allergies:   Penicillins, Pregabalin, Sulfa antibiotics, Ondansetron, Other, and Zofran [ondansetron hcl]    Social History:  The patient  reports  that she has been smoking cigarettes. She has a 20 pack-year smoking history. She has never used smokeless tobacco. She reports that she does not currently use alcohol. She reports that she does not use drugs.   Family History:  The patient's family history includes Breast cancer in her paternal aunt and paternal uncle; Cancer in her paternal uncle; Diabetes in her mother; Heart attack in her father; Heart disease in her father; Hypertension in her mother; Other in her father; Peripheral Artery Disease in her father; Pulmonary fibrosis in her mother.    ROS:  Please see the history of present illness.   Otherwise, review of systems are positive for none.   All other systems are reviewed and negative.    PHYSICAL EXAM: VS:  BP 106/70 (BP Location: Left Arm, Patient Position: Sitting, Cuff Size: Normal)   Pulse 84   Ht 5\' 3"  (1.6 m)   Wt 156 lb 4 oz (70.9 kg)   SpO2 98%   BMI 27.68 kg/m  , BMI Body mass index is 27.68 kg/m. GEN: Well nourished, well developed, in no acute distress  HEENT: normal  Neck: no JVD, carotid bruits, or masses Cardiac: RRR; no murmurs, rubs, or gallops,no edema  Respiratory:  clear to auscultation bilaterally, normal work of breathing GI: soft, nontender, nondistended, + BS MS: no deformity or atrophy  Skin: warm and dry, no rash Neuro:  Strength and sensation are intact Psych: euthymic mood, full affect   EKG:  EKG is ordered today. The ekg ordered today demonstrates: Normal sinus rhythm Nonspecific ST abnormality    Recent Labs: 06/29/2022: BUN 11; Creatinine, Ser 0.65; Hemoglobin 14.3; Platelets 270; Potassium 4.3; Sodium 136    Lipid Panel    Component Value Date/Time   CHOL 181 01/20/2022 1109   TRIG 107 01/20/2022 1109   HDL 62 01/20/2022 1109   LDLCALC 100 (H) 01/20/2022 1109      Wt Readings from Last 3 Encounters:  06/17/23 156 lb 4 oz (70.9 kg)  06/03/23 156 lb (70.8 kg)  05/04/23 154 lb (69.9 kg)          09/16/2022    2:14  PM  PAD Screen  Previous PAD dx? No  Previous surgical procedure? No  Pain with walking? No  Feet/toe relief with dangling? No  Painful, non-healing ulcers? No  Extremities discolored? No      ASSESSMENT AND PLAN:  1.  Coronary artery disease involving native coronary arteries without angina: Continue medical therapy and low-dose aspirin given degree of coronary calcifications.    2.  Hyperlipidemia: I recommend a target LDL of less than 55.  I requested lipid profile and CMP.  I suspect that we will have to intensify her statin treatment based on the results.    3.  Tobacco use: I again discussed with her the importance of  smoking cessation.    Disposition:   Follow-up in 12 months  Signed,  Lorine Bears, MD  06/17/2023 9:09 AM    [facility]

## 2023-06-17 NOTE — Patient Instructions (Signed)
Medication Instructions:  No changes *If you need a refill on your cardiac medications before your next appointment, please call your pharmacy*   Lab Work: Your provider would like for you to have following labs drawn today CMET and Lipid.   If you have labs (blood work) drawn today and your tests are completely normal, you will receive your results only by: MyChart Message (if you have MyChart) OR A paper copy in the mail If you have any lab test that is abnormal or we need to change your treatment, we will call you to review the results.   Testing/Procedures: None ordered   Follow-Up: At [facility], you and your health needs are our priority.  As part of our continuing mission to provide you with exceptional heart care, we have created designated Provider Care Teams.  These Care Teams include your primary Cardiologist (physician) and Advanced Practice Providers (APPs -  Physician Assistants and Nurse Practitioners) who all work together to provide you with the care you need, when you need it.  We recommend signing up for the patient portal called "MyChart".  Sign up information is provided on this After Visit Summary.  MyChart is used to connect with patients for Virtual Visits (Telemedicine).  Patients are able to view lab/test results, encounter notes, upcoming appointments, etc.  Non-urgent messages can be sent to your provider as well.   To learn more about what you can do with MyChart, go to [email or web address].    Your next appointment:   12 month(s)  Provider:   You may see Lorine Bears, MD or one of the following Advanced Practice Providers on your designated Care Team:   Nicolasa Ducking, NP Eula Listen, PA-C Cadence Fransico Michael, PA-C Charlsie Quest, NP Carlos Levering, NP    Other Instructions Managing the Challenge of Quitting Smoking Quitting smoking is a physical and mental challenge. You may have cravings, withdrawal symptoms, and temptation to smoke.  Before quitting, work with your health care provider to make a plan that can help you manage quitting. Making a plan before you quit may keep you from smoking when you have the urge to smoke while trying to quit. How to manage lifestyle changes Managing stress Stress can make you want to smoke, and wanting to smoke may cause stress. It is important to find ways to manage your stress. You could try some of the following: Practice relaxation techniques. Breathe slowly and deeply, in through your nose and out through your mouth. Listen to music. Soak in a bath or take a shower. Imagine a peaceful place or vacation. Get some support. Talk with family or friends about your stress. Join a support group. Talk with a counselor or therapist. Get some physical activity. Go for a walk, run, or bike ride. Play a favorite sport. Practice yoga.  Medicines Talk with your health care provider about medicines that might help you deal with cravings and make quitting easier for you. Relationships Social situations can be difficult when you are quitting smoking. To manage this, you can: Avoid parties and other social situations where people might be smoking. Avoid alcohol. Leave right away if you have the urge to smoke. Explain to your family and friends that you are quitting smoking. Ask for support and let them know you might be a bit grumpy. Plan activities where smoking is not an option. General instructions Be aware that many people gain weight after they quit smoking. However, not everyone does. To keep from gaining weight, have a  plan in place before you quit, and stick to the plan after you quit. Your plan should include: Eating healthy snacks. When you have a craving, it may help to: Eat popcorn, or try carrots, celery, or other cut vegetables. Chew sugar-free gum. Changing how you eat. Eat small portion sizes at meals. Eat 4-6 small meals throughout the day instead of 1-2 large meals a  day. Be mindful when you eat. You should avoid watching television or doing other things that might distract you as you eat. Exercising regularly. Make time to exercise each day. If you do not have time for a long workout, do short bouts of exercise for 5-10 minutes several times a day. Do some form of strengthening exercise, such as weight lifting. Do some exercise that gets your heart beating and causes you to breathe deeply, such as walking fast, running, swimming, or biking. This is very important. Drinking plenty of water or other low-calorie or no-calorie drinks. Drink enough fluid to keep your urine pale yellow.  How to recognize withdrawal symptoms Your body and mind may experience discomfort as you try to get used to not having nicotine in your system. These effects are called withdrawal symptoms. They may include: Feeling hungrier than normal. Having trouble concentrating. Feeling irritable or restless. Having trouble sleeping. Feeling depressed. Craving a cigarette. These symptoms may surprise you, but they are normal to have when quitting smoking. To manage withdrawal symptoms: Avoid places, people, and activities that trigger your cravings. Remember why you want to quit. Get plenty of sleep. Avoid coffee and other drinks that contain caffeine. These may worsen some of your symptoms. How to manage cravings Come up with a plan for how to deal with your cravings. The plan should include the following: A definition of the specific situation you want to deal with. An activity or action you will take to replace smoking. A clear idea for how this action will help. The name of someone who could help you with this. Cravings usually last for 5-10 minutes. Consider taking the following actions to help you with your plan to deal with cravings: Keep your mouth busy. Chew sugar-free gum. Suck on hard candies or a straw. Brush your teeth. Keep your hands and body busy. Change to a  different activity right away. Squeeze or play with a ball. Do an activity or a hobby, such as making bead jewelry, practicing needlepoint, or working with wood. Mix up your normal routine. Take a short exercise break. Go for a quick walk, or run up and down stairs. Focus on doing something kind or helpful for someone else. Call a friend or family member to talk during a craving. Join a support group. Contact a quitline. Where to find support To get help or find a support group: Call the National Cancer Institute's Smoking Quitline: 1-800-QUIT-NOW (680)432-6321) Text QUIT to SmokefreeTXT: 454098 Where to find more information Visit these websites to find more information on quitting smoking: U.S. Department of Health and Human Services: www.smokefree.gov American Lung Association: www.freedomfromsmoking.org Centers for Disease Control and Prevention (CDC): FootballExhibition.com.br American Heart Association: www.heart.org Contact a health care provider if: You want to change your plan for quitting. The medicines you are taking are not helping. Your eating feels out of control or you cannot sleep. You feel depressed or become very anxious. Summary Quitting smoking is a physical and mental challenge. You will face cravings, withdrawal symptoms, and temptation to smoke again. Preparation can help you as you go through these challenges.  Try different techniques to manage stress, handle social situations, and prevent weight gain. You can deal with cravings by keeping your mouth busy (such as by chewing gum), keeping your hands and body busy, calling family or friends, or contacting a quitline for people who want to quit smoking. You can deal with withdrawal symptoms by avoiding places where people smoke, getting plenty of rest, and avoiding drinks that contain caffeine. This information is not intended to replace advice given to you by your health care provider. Make sure you discuss any questions you have  with your health care provider. Document Revised: 07/11/2021 Document Reviewed: 07/11/2021 Elsevier Patient Education  2024 [employer].

## 2023-06-18 LAB — COMPREHENSIVE METABOLIC PANEL
ALT: 24 [IU]/L (ref 0–32)
AST: 28 [IU]/L (ref 0–40)
Albumin: 4.4 g/dL (ref 3.9–4.9)
Alkaline Phosphatase: 98 [IU]/L (ref 44–121)
BUN/Creatinine Ratio: 19 (ref 9–23)
BUN: 12 mg/dL (ref 6–24)
Bilirubin Total: 0.3 mg/dL (ref 0.0–1.2)
CO2: 24 mmol/L (ref 20–29)
Calcium: 9.9 mg/dL (ref 8.7–10.2)
Chloride: 100 mmol/L (ref 96–106)
Creatinine, Ser: 0.64 mg/dL (ref 0.57–1.00)
Globulin, Total: 2.5 g/dL (ref 1.5–4.5)
Glucose: 75 mg/dL (ref 70–99)
Potassium: 5.4 mmol/L — ABNORMAL HIGH (ref 3.5–5.2)
Sodium: 141 mmol/L (ref 134–144)
Total Protein: 6.9 g/dL (ref 6.0–8.5)
eGFR: 110 mL/min/{1.73_m2} (ref >59)

## 2023-06-18 LAB — LIPID PANEL
Chol/HDL Ratio: 1.8 ratio (ref 0.0–4.4)
Cholesterol, Total: 140 mg/dL (ref 100–199)
HDL: 77 mg/dL (ref >39)
LDL Chol Calc (NIH): 51 mg/dL (ref 0–99)
Triglycerides: 53 mg/dL (ref 0–149)
VLDL Cholesterol Cal: 12 mg/dL (ref 5–40)

## 2023-06-23 DIAGNOSIS — L309 Dermatitis, unspecified: Secondary | ICD-10-CM | POA: Diagnosis not present

## 2023-06-23 DIAGNOSIS — Z79899 Other long term (current) drug therapy: Secondary | ICD-10-CM | POA: Diagnosis not present

## 2023-06-23 DIAGNOSIS — L650 Telogen effluvium: Secondary | ICD-10-CM | POA: Diagnosis not present

## 2023-06-23 DIAGNOSIS — L648 Other androgenic alopecia: Secondary | ICD-10-CM | POA: Diagnosis not present

## 2023-06-28 ENCOUNTER — Encounter: Payer: Self-pay | Admitting: Student in an Organized Health Care Education/Training Program

## 2023-06-28 ENCOUNTER — Ambulatory Visit: Payer: BC Managed Care – PPO | Admitting: Student in an Organized Health Care Education/Training Program

## 2023-06-28 VITALS — BP 110/80 | HR 101 | Temp 97.6°F | Ht 63.0 in | Wt 154.0 lb

## 2023-06-28 DIAGNOSIS — J432 Centrilobular emphysema: Secondary | ICD-10-CM | POA: Diagnosis not present

## 2023-06-28 DIAGNOSIS — F17200 Nicotine dependence, unspecified, uncomplicated: Secondary | ICD-10-CM

## 2023-06-28 DIAGNOSIS — R911 Solitary pulmonary nodule: Secondary | ICD-10-CM

## 2023-06-28 DIAGNOSIS — J4530 Mild persistent asthma, uncomplicated: Secondary | ICD-10-CM | POA: Diagnosis not present

## 2023-06-28 MED ORDER — BUDESONIDE-FORMOTEROL FUMARATE 160-4.5 MCG/ACT IN AERO: 2.0000 | INHALATION_SPRAY | Freq: Two times a day (BID) | RESPIRATORY_TRACT | 6 refills | Status: DC

## 2023-06-28 MED ORDER — VARENICLINE TARTRATE 1 MG PO TABS: 1.0000 mg | ORAL_TABLET | Freq: Two times a day (BID) | ORAL | 1 refills | Status: AC

## 2023-06-28 MED ORDER — VARENICLINE TARTRATE (STARTER) 0.5 MG X 11 & 1 MG X 42 PO TBPK: 0.5000 mg | ORAL_TABLET | Freq: Two times a day (BID) | ORAL | 0 refills | Status: DC

## 2023-06-28 MED ORDER — AEROCHAMBER MV MISC: 0 refills | Status: AC

## 2023-06-28 NOTE — Progress Notes (Signed)
Assessment & Plan:   #Mild persistent asthma without complication #Preserved Ratio Impaired Spirometry  Patient is presenting to follow up on shortness of breath as well as cough. There was also concern for possible ILD given early onset of gray hair and ILD in the family. She has underwent high resolution chest CT that does not show any signs of ILD. The CT does show airway thickening suggestive of bronchitis as well as emphysema. There was a small nodule (see below) that will require follow up. PFT's show an obstructive defect with a brisk response to bronchodilator challenge, with post bronchodilator FEV1/FVC ratio of 0.8. This is suggestive of asthma with potential PRISM. History cell counts do show significant eosinophilia, up to 1300 in 2013. Lung volumes show air trapping and there's a mild drop in DLCO.  Given all this, I will initiate the patient on Symbicort for the management of asthma with a spacer device. I will also obtain an allergen panel to assess for t-helper cell type response. I will order repeat PFT's on follow up to assess for residual obstruction and need to increase ICS dose.  - budesonide-formoterol (SYMBICORT) 160-4.5 MCG/ACT inhaler; Inhale 2 puffs into the lungs in the morning and at bedtime.  Dispense: 3 each; Refill: 6 - Spacer/Aero-Holding Chambers (AEROCHAMBER MV) inhaler; Use as instructed  Dispense: 1 each; Refill: 0 - Allergen Panel (27) + IGE; Future  #Pulmonary nodule  Noted to have a small pulmonary nodule with mild cavitation on chest imaging that is suggestive of an infectious etiology. Will obtain repeat chest CT at the one year mark to ensure improvement/resolution.  - CT CHEST WO CONTRAST; Future  #Tobacco dependence  History of tobacco use with findings of PRISM and Asthma as above. Counseled the patient extensively regarding importance of smoking cessation. She has tried chantix before and would like to try it again. Discussed how to initiate  chantix and side effects. Will send prescription to the pharmacy for both a starter and maintenance pack.  - varenicline (CHANTIX CONTINUING MONTH PAK) 1 MG tablet; Take 1 tablet (1 mg total) by mouth 2 (two) times daily.  Dispense: 60 tablet; Refill: 1 - Varenicline Tartrate, Starter, (CHANTIX STARTING MONTH PAK) 0.5 MG X 11 & 1 MG X 42 TBPK; Take 0.5 mg by mouth in the morning and at bedtime. Days 1-3: Take one 0.5 mg white pill each morning for 3 days, one week before quit date. Days 4-7: Increase to one 0.5 mg white pill twice a day in morning and evening for 4 days. On Day 8 (target quit date), increase to one 1 mg blue pill twice a day. Maintain this dose for a minimum of 3 months.  Dispense: 1 each; Refill: 0  Return in about 4 months (around 10/26/2023).  I spent 48 minutes caring for this patient today, including preparing to see the patient, obtaining a medical history , reviewing a separately obtained history, performing a medically appropriate examination and/or evaluation, counseling and educating the patient/family/caregiver, ordering medications, tests, or procedures, documenting clinical information in the electronic health record, and independently interpreting results (not separately reported/billed) and communicating results to the patient/family/caregiver  Raechel Chute, MD [facility] 06/28/2023 6:28 PM    End of visit medications:  Meds ordered this encounter  Medications   budesonide-formoterol (SYMBICORT) 160-4.5 MCG/ACT inhaler    Sig: Inhale 2 puffs into the lungs in the morning and at bedtime.    Dispense:  3 each    Refill:  6   Spacer/Aero-Holding Chambers (AEROCHAMBER MV) inhaler    Sig: Use as instructed    Dispense:  1 each    Refill:  0   varenicline (CHANTIX CONTINUING MONTH PAK) 1 MG tablet    Sig: Take 1 tablet (1 mg total) by mouth 2 (two) times daily.    Dispense:  60 tablet    Refill:  1   Varenicline Tartrate, Starter, (CHANTIX  STARTING MONTH PAK) 0.5 MG X 11 & 1 MG X 42 TBPK    Sig: Take 0.5 mg by mouth in the morning and at bedtime. Days 1-3: Take one 0.5 mg white pill each morning for 3 days, one week before quit date. Days 4-7: Increase to one 0.5 mg white pill twice a day in morning and evening for 4 days. On Day 8 (target quit date), increase to one 1 mg blue pill twice a day. Maintain this dose for a minimum of 3 months.    Dispense:  1 each    Refill:  0     Current Outpatient Medications:    aspirin EC 81 MG tablet, Take 1 tablet (81 mg total) by mouth daily. Swallow whole., Disp: 90 tablet, Rfl: 3   atorvastatin (LIPITOR) 20 MG tablet, Take 1 tablet (20 mg total) by mouth daily., Disp: 90 tablet, Rfl: 3   budesonide-formoterol (SYMBICORT) 160-4.5 MCG/ACT inhaler, Inhale 2 puffs into the lungs in the morning and at bedtime., Disp: 3 each, Rfl: 6   hydrochlorothiazide (HYDRODIURIL) 12.5 MG tablet, TAKE (1) TABLET BY MOUTH EVERY DAY, Disp: 90 tablet, Rfl: 0   HYDROcodone-acetaminophen (NORCO) 10-325 MG tablet, Take 1 tablet by mouth 5 (five) times daily as needed., Disp: 150 tablet, Rfl: 0   methocarbamol (ROBAXIN) 500 MG tablet, TAKE 1 TABLET(500 MG) BY MOUTH FOUR TIMES DAILY, Disp: 120 tablet, Rfl: 0   minoxidil (LONITEN) 2.5 MG tablet, Take 1.25 mg by mouth 2 (two) times daily., Disp: , Rfl:    nortriptyline (PAMELOR) 10 MG capsule, TAKE (3) CAPSULES AT BEDTIME AS DIRECTED, Disp: 270 capsule, Rfl: 1   Spacer/Aero-Holding Chambers (AEROCHAMBER MV) inhaler, Use as instructed, Disp: 1 each, Rfl: 0   spironolactone (ALDACTONE) 100 MG tablet, Take 100 mg by mouth daily., Disp: , Rfl:    varenicline (CHANTIX CONTINUING MONTH PAK) 1 MG tablet, Take 1 tablet (1 mg total) by mouth 2 (two) times daily., Disp: 60 tablet, Rfl: 1   Varenicline Tartrate, Starter, (CHANTIX STARTING MONTH PAK) 0.5 MG X 11 & 1 MG X 42 TBPK, Take 0.5 mg by mouth in the morning and at bedtime. Days 1-3: Take one 0.5 mg white pill each morning for  3 days, one week before quit date. Days 4-7: Increase to one 0.5 mg white pill twice a day in morning and evening for 4 days. On Day 8 (target quit date), increase to one 1 mg blue pill twice a day. Maintain this dose for a minimum of 3 months., Disp: 1 each, Rfl: 0   Vitamin D, Ergocalciferol, (DRISDOL) 1.25 MG (50000 UNIT) CAPS capsule, Take 50,000 Units by mouth once a week., Disp: , Rfl:    zolpidem (AMBIEN) 10 MG tablet, Take 1 tablet (10 mg total) by mouth at bedtime as needed for sleep., Disp: 30 tablet, Rfl: 2   Subjective:   PATIENT ID: Elizabeth Torres GENDER: female DOB: [birth year], MRN: [id number]  Chief Complaint  Patient presents with   Follow-up    Shortness of breath on exertion. No cough or wheezing.  HPI  Elizabeth Torres is a 47 year old female presenting today for follow up.  Symptoms of difficulty breathing as well as cough productive of sputum persist. She continues to smoke a pack a day. She hasn't had any chest pain or chest tightness, though does report a discomfort when taking a deep breath. Shortness of breath does not interfere with her activities of daily living.   She does not have any wheezing, night sweats, weight loss, fevers, or chills.  She denies any hemoptysis, denies any GI/GU symptoms.  She denies any rashes, joint pains, and joint swelling.  Patient is concerned that she could have pulmonary fibrosis as her mom died secondary to it. She has no other family history of lung disease besides a brother with asthma. She has no wheezing and no shortness of breath with the cold air, and reports no limitations in activity as a child or young adult.    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  She has been doing this job for 5 years.  She is a smoker, smoking up to a pack a day for over 30 years.  She does not have any other inhalational exposures.  Ancillary information including prior medications, full medical/surgical/family/social  histories, and PFTs (when available) are listed below and have been reviewed.   Review of Systems  Constitutional:  Negative for chills, diaphoresis, fever, malaise/fatigue and weight loss.  HENT:  Negative for hearing loss, sinus pain and sore throat.   Eyes:  Negative for blurred vision.  Respiratory:  Positive for cough and shortness of breath. Negative for hemoptysis, sputum production and wheezing.   Cardiovascular:  Positive for chest pain. Negative for palpitations and leg swelling.  Skin:  Negative for itching and rash.     Objective:   Vitals:   06/28/23 1140  BP: 110/80  Pulse: (!) 101  Temp: 97.6 F (36.4 C)  TempSrc: Temporal  SpO2: 97%  Weight: 154 lb (69.9 kg)  Height: 5\' 3"  (1.6 m)   97% on RA  BMI Readings from Last 3 Encounters:  06/28/23 27.28 kg/m  06/17/23 27.68 kg/m  06/03/23 27.63 kg/m   Wt Readings from Last 3 Encounters:  06/28/23 154 lb (69.9 kg)  06/17/23 156 lb 4 oz (70.9 kg)  06/03/23 156 lb (70.8 kg)    Physical Exam Constitutional:      General: She is not in acute distress.    Appearance: Normal appearance. She is not ill-appearing.  HENT:     Head: Normocephalic.     Mouth/Throat:     Mouth: Mucous membranes are moist.  Cardiovascular:     Rate and Rhythm: Normal rate and regular rhythm.     Heart sounds: Normal heart sounds.  Pulmonary:     Effort: Pulmonary effort is normal.     Breath sounds: Normal breath sounds. No wheezing or rales.  Abdominal:     Palpations: Abdomen is soft.  Musculoskeletal:     Right lower leg: No edema.     Left lower leg: No edema.  Neurological:     General: No focal deficit present.     Mental Status: She is alert and oriented to person, place, and time. Mental status is at baseline.       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   Personal history of chemotherapy 2013   for breast ca   Personal history of radiation therapy 2013   F/U right breast cancer    Pneumonia    Radiation 2013   BREAST CA   Status post chemotherapy 2013   BREAST CA     Family History  Problem Relation Age of Onset   Diabetes Mother    Hypertension Mother    Pulmonary fibrosis Mother    Heart disease Father    Heart attack Father    Other Father        unknown medical history   Peripheral Artery Disease Father    Breast cancer Paternal Aunt        70'S   Cancer Paternal Uncle    Breast cancer Paternal Uncle        4'S     Past Surgical History:  Procedure Laterality Date   ANTERIOR CERVICAL DECOMP/DISCECTOMY FUSION N/A 07/08/2022   Procedure: C4-7 ANTERIOR CERVICAL DISCECTOMY AND FUSION (GLOBUS HEDRON);  Surgeon: Venetia Night, MD;  Location: [facility];  Service: Neurosurgery;  Laterality: N/A;   BREAST BIOPSY Right 07/2011   invasive ductal carcinoma   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 RAd and chemo tx   BREAST SURGERY     mastectomy Right    partial with lymph node dissection   PORT A CATH REVISION     PORT-A-CATH REMOVAL  11-07-15   Dr Lemar Livings   SHOULDER ARTHROSCOPY WITH ROTATOR CUFF REPAIR Left 05/18/2019   Procedure: SHOULDER ARTHROSCOPY WITH SUBACROMIAL DECOMPRESSION AND DISTAL CLAVICAL EXCISION, INTRAARTICULAR DEBRIDEMENT;  Surgeon: Lyndle Herrlich, MD;  Location: [facility];  Service: Orthopedics;  Laterality: Left;   SPINE SURGERY  2008   Dr Jeral Fruit   TUBAL LIGATION      Social History   Socioeconomic History   Marital status: Married    Spouse name: Yamari Hedinger   Number of children: 3   Years of education: Not on file   Highest education level: Not on file  Occupational History   Not on file  Tobacco Use   Smoking status: Every Day    Current packs/day: 1.00    Average packs/day: 1  pack/day for 30.9 years (30.9 ttl pk-yrs)    Types: Cigarettes    Start date: 78   Smokeless tobacco: Never  Vaping Use   Vaping status: Never Used  Substance and Sexual Activity   Alcohol use: Not Currently   Drug use: Never   Sexual activity: Yes    Partners: Male  Other Topics Concern   Not on file  Social History Narrative   Not on file   Social Determinants of Health   Financial Resource Strain: Not on file  Food Insecurity: No Food Insecurity (07/14/2022)   Hunger Vital Sign    Worried About Running Out of Food in the Last Year: Never true    Ran Out of Food in the Last Year: Never true  Transportation Needs: No Transportation Needs (07/14/2022)   PRAPARE - Administrator, Civil Service (Medical): No    Lack of Transportation (Non-Medical): No  Physical Activity: Not on file  Stress: Not on file  Social Connections: Not on file  Intimate Partner Violence: Not At Risk (07/08/2022)   Humiliation, Afraid, Rape, and Kick questionnaire    Fear of Current or Ex-Partner:  No    Emotionally Abused: No    Physically Abused: No    Sexually Abused: No     Allergies  Allergen Reactions   Penicillins Anaphylaxis   Pregabalin Other (See Comments)    Thoughts of self harm   Sulfa Antibiotics Anaphylaxis   Ondansetron     migraines   Other    Zofran [Ondansetron Hcl]     migraines     CBC    Component Value Date/Time   WBC 7.3 06/29/2022 0935   RBC 4.54 06/29/2022 0935   HGB 14.3 06/29/2022 0935   HGB 14.0 07/22/2021 0825   HGB 13.6 08/10/2011 1631   HCT 41.5 06/29/2022 0935   HCT 41.0 07/22/2021 0825   HCT 40.7 08/10/2011 1631   PLT 270 06/29/2022 0935   PLT 274 07/22/2021 0825   MCV 91.4 06/29/2022 0935   MCV 95 07/22/2021 0825   MCV 94 04/26/2012 0450   MCV 95.5 08/10/2011 1631   MCH 31.5 06/29/2022 0935   MCHC 34.5 06/29/2022 0935   RDW 14.0 06/29/2022 0935   RDW 12.2 07/22/2021 0825   RDW 14.2 04/26/2012 0450   RDW 14.0 08/10/2011 1631    LYMPHSABS 2.6 07/22/2021 0825   LYMPHSABS 1.8 04/26/2012 0450   LYMPHSABS 3.3 08/10/2011 1631   MONOABS 0.8 10/09/2017 1005   MONOABS 0.6 04/26/2012 0450   MONOABS 0.6 08/10/2011 1631   EOSABS 0.6 (H) 07/22/2021 0825   EOSABS 1.3 (H) 04/26/2012 0450   BASOSABS 0.1 07/22/2021 0825   BASOSABS 0.1 04/26/2012 0450   BASOSABS 0.0 08/10/2011 1631    Pulmonary Functions Testing Results:    Latest Ref Rng & Units 09/29/2022    9:31 AM  PFT Results  FVC-Pre L 2.59   FVC-Predicted Pre % 73   FVC-Post L 2.85   FVC-Predicted Post % 81   Pre FEV1/FVC % % 74   Post FEV1/FCV % % 80   FEV1-Pre L 1.92   FEV1-Predicted Pre % 67   FEV1-Post L 2.27   DLCO uncorrected ml/min/mmHg 15.25   DLCO UNC% % 73   DLVA Predicted % 76   TLC L 5.63   TLC % Predicted % 114   RV % Predicted % 172     Outpatient Medications Prior to Visit  Medication Sig Dispense Refill   aspirin EC 81 MG tablet Take 1 tablet (81 mg total) by mouth daily. Swallow whole. 90 tablet 3   atorvastatin (LIPITOR) 20 MG tablet Take 1 tablet (20 mg total) by mouth daily. 90 tablet 3   hydrochlorothiazide (HYDRODIURIL) 12.5 MG tablet TAKE (1) TABLET BY MOUTH EVERY DAY 90 tablet 0   HYDROcodone-acetaminophen (NORCO) 10-325 MG tablet Take 1 tablet by mouth 5 (five) times daily as needed. 150 tablet 0   methocarbamol (ROBAXIN) 500 MG tablet TAKE 1 TABLET(500 MG) BY MOUTH FOUR TIMES DAILY 120 tablet 0   minoxidil (LONITEN) 2.5 MG tablet Take 1.25 mg by mouth 2 (two) times daily.     nortriptyline (PAMELOR) 10 MG capsule TAKE (3) CAPSULES AT BEDTIME AS DIRECTED 270 capsule 1   spironolactone (ALDACTONE) 100 MG tablet Take 100 mg by mouth daily.     Vitamin D, Ergocalciferol, (DRISDOL) 1.25 MG (50000 UNIT) CAPS capsule Take 50,000 Units by mouth once a week.     zolpidem (AMBIEN) 10 MG tablet Take 1 tablet (10 mg total) by mouth at bedtime as needed for sleep. 30 tablet 2   No facility-administered medications prior to visit.

## 2023-06-28 NOTE — Patient Instructions (Signed)
The [facility] Quitline: Call 1-800-QUIT-NOW 575-707-4353). The [facility] Quitline is a free service for [employer]. Trained counselors are available from 8 am until 3 am, 365 days per year. Services are available in both [place] and [place].   Web Resources Free online support programs can help you track your progress and share experiences with others who are quitting. These are examples: www.becomeanex.org www.trytostop.org  www.smokefree.gov  www.[employer].com.br.aspx  [facility]: offers comprehensive in-person tobacco treatment counseling at [facility] ([address], [place] [address]).  Open to everyone. Virtual appointments available. Free parking. Call [phone] to schedule an appointment or [phone] for general information.    Tobacco Cessation Medications  Nicotine Replacement Therapy (NRT)  Nicotine is the addictive part of tobacco smoke, but not the most dangerous part. There are 7000 other toxins in cigarettes, including carbon monoxide, that cause disease. People do not generally become addicted to medication. Common problems: People don't use enough medication or stop too early. Medications are safe and effective. Overdose is very uncommon. Use medications as long as needed (3 months minimum). Some combinations work better than single medications. Long acting medications like the NRT patch and bupropion provide continuous treatment for withdrawal symptoms.  PLUS  Short acting medications like the NRT gum, lozenge, inhaler, and nasal spray help people to cope with breakthrough cravings.  ? Nicotine Patch  Place patch on hairless skin on upper body, including arms and back. Each day: discard old patch, shower, apply new patch to a different site. Apply hydrocortisone cream to mildly red/irritated areas. Call provider if rash develops. If patch causes sleep disturbance, remove patch  at bedtime and replace each morning after shower. Side effects may include: skin irritation, headache, insomnia, abnormal/vivid dreams.  ? Nicotine Gum  Chew gum slowly, park in cheek when peppery taste or tingling sensation begins (about 15-30 chews). When taste or tingling goes away, begin chewing again. Use until nicotine is gone (taste or tingle does not return, usually 30 minutes). Park in different areas of mouth. Nicotine is absorbed through the lining of the mouth. Use enough to control cravings, up to 24 pieces per day (if used alone). Avoid eating or drinking for 15 minutes before using and during use. Side effects may include: mouth/jaw soreness, hiccups, indigestion, hypersalivation.  If gum is not chewed correctly, additional side effects may include lightheadedness, nausea/vomiting, throat and mouth irritation.  ? Nicotine Lozenge  Allow to dissolve slowly in mouth (20-30 minutes). Do not chew or swallow. Nicotine release may cause a warm tingling sensation. Occasionally rotate to different areas of the mouth. Use enough to control cravings, up to 20 lozenges per day (if used alone). Avoid eating or drinking for 15 minutes before using and during use. Side effects may include: nausea, hiccups, cough, heartburn, headache, gas, insomnia.  ? Nicotine Nasal Spray Use 1 spray in each nostril (1 dose) and tilt head back for 1 minute. Do not sniff, swallow, or inhale through nose.  Use at least 8 doses (1 spray in each nostril) , up to 40 doses per day (if used alone). To reduce nasal irritation, spray on cotton swab and insert into nose. Side effects may include: nasal and/or throat irritation (hot, peppery, or burning sensation), nasal irritation, tearing, sneezing, cough, headache.  ? Nicotine Oral Inhaler (puffer) Inhale into the back of the throat or puff in short breaths. Do not inhale into the lungs.  Puff continuously for 20 minutes (about 80 puffs) until cartridge  is  empty. Change cartridge when it loses the "burning in throat" sensation (feels like air only). Open cartridges can be saved and used again within 24 hours. Use at least 6 and up to 16 cartridges per day (if used alone).  Avoid eating or drinking for 15 minutes before using and during use. Side effects may include: mouth and/or throat irritation, unpleasant taste, cough, nasal irritation, indigestion, hiccups, headache.  ? Chantix (varenicline) Days 1-3: Take one 0.5 mg white pill each morning for 3 days, one week before quit date. Days 4-7: Increase to one 0.5 mg white pill twice a day in morning and evening for 4 days.  On Day 8 (target quit date), increase to one 1 mg blue pill twice a day. Maintain this dose for a minimum of 3 months. Take with food and a full glass of water to reduce nausea. Be sure that the two doses are at least 8 hours apart, but try to take second dose early in the evening (i.e. 6 pm) to avoid sleep problems. Common side effects include: nausea, insomnia, headache, abnormal/vivid dreams. Tell your doctor if you have any history of psychiatric illness prior to starting Chantix.  STOP taking CHANTIX and contact a healthcare provider immediately if you experience agitation, hostility, depressed mood, changes in thoughts or behavior that are not typical for you, thinking about or attempting suicide, allergic or skin reactions including swelling, rash, redness, or peeling of the skin.  For patients who have heart disease: Smoking is a major risk factor for cardiovascular disease, and Chantix can help you quit smoking. Chantix may be associated with a small, increased risk of certain heart events in patients who have heart disease. If you have any new or worsening symptoms of heart disease while taking Chantix, such as shortness of breath or trouble breathing, new or worsening chest pain, or new or worsening pain in your legs when walking, call your doctor or get emergency medical  help immediately.  ? Wellbutrin / Zyban (bupropion) Take one 150 mg pill each morning for 3 days, one week before target quit date. On Day 4, increase to one 150 mg pill twice a day, morning and evening.  Maintain this dose for a minimum of 3 months. Be sure that the two doses are at least 8 hours apart, but try to take second dose early in the evening (i.e. 6 pm) to avoid sleep problems. Avoid or minimize use of alcohol when taking this medication. Common side effects include: dry mouth, headache, insomnia, nausea, weight loss.  Risk of seizure is 08/998. STOP taking BUPROPION and contact a healthcare provider immediately if you experience agitation, hostility, depressed mood, changes in thoughts or behavior that are not typical for you, thinking about or attempting suicide, allergic or skin reactions including swelling, rash, redness, or peeling of the skin.

## 2023-06-30 ENCOUNTER — Encounter: Payer: BC Managed Care – PPO | Admitting: Registered Nurse

## 2023-07-05 ENCOUNTER — Telehealth: Payer: Self-pay | Admitting: Registered Nurse

## 2023-07-05 ENCOUNTER — Other Ambulatory Visit: Payer: Self-pay | Admitting: Cardiovascular Disease

## 2023-07-05 MED ORDER — HYDROCODONE-ACETAMINOPHEN 10-325 MG PO TABS: 1.0000 | ORAL_TABLET | Freq: Every day | ORAL | 0 refills | Status: DC | PRN

## 2023-07-05 NOTE — Telephone Encounter (Signed)
PMP was Reviewed.  Hydrocodone e-scribed to pharmacy.  [name] is aware of the above via My-Chart

## 2023-07-30 ENCOUNTER — Encounter: Payer: Self-pay | Admitting: Registered Nurse

## 2023-07-30 ENCOUNTER — Encounter: Payer: BC Managed Care – PPO | Attending: Physical Medicine and Rehabilitation | Admitting: Registered Nurse

## 2023-07-30 VITALS — BP 127/84 | HR 97 | Ht 63.0 in | Wt 157.4 lb

## 2023-07-30 DIAGNOSIS — G620 Drug-induced polyneuropathy: Secondary | ICD-10-CM | POA: Insufficient documentation

## 2023-07-30 DIAGNOSIS — T451X5A Adverse effect of antineoplastic and immunosuppressive drugs, initial encounter: Secondary | ICD-10-CM | POA: Insufficient documentation

## 2023-07-30 DIAGNOSIS — M62838 Other muscle spasm: Secondary | ICD-10-CM | POA: Insufficient documentation

## 2023-07-30 DIAGNOSIS — Z79891 Long term (current) use of opiate analgesic: Secondary | ICD-10-CM | POA: Diagnosis not present

## 2023-07-30 DIAGNOSIS — Z5181 Encounter for therapeutic drug level monitoring: Secondary | ICD-10-CM | POA: Diagnosis not present

## 2023-07-30 DIAGNOSIS — M5416 Radiculopathy, lumbar region: Secondary | ICD-10-CM | POA: Diagnosis not present

## 2023-07-30 DIAGNOSIS — G4700 Insomnia, unspecified: Secondary | ICD-10-CM | POA: Diagnosis not present

## 2023-07-30 DIAGNOSIS — G894 Chronic pain syndrome: Secondary | ICD-10-CM | POA: Diagnosis not present

## 2023-07-30 MED ORDER — METHOCARBAMOL 500 MG PO TABS: 500.0000 mg | ORAL_TABLET | Freq: Two times a day (BID) | ORAL | 1 refills | Status: DC | PRN

## 2023-07-30 MED ORDER — HYDROCODONE-ACETAMINOPHEN 10-325 MG PO TABS: 1.0000 | ORAL_TABLET | Freq: Every day | ORAL | 0 refills | Status: DC | PRN

## 2023-07-30 NOTE — Progress Notes (Signed)
Subjective:    Patient ID: Elizabeth Torres, female    DOB: 1976-05-09, 47 y.o.   MRN: [id number]  HPI: Elizabeth Torres is a 47 y.o. female who returns for follow up appointment for chronic pain and medication refill. She states her pain is located in her lower back radiating into her bilateral lower extremities. She rates her pain 4. Her current exercise regime is walking and performing stretching exercises.  [name] is 50.00 MME.   Last UDS was Performed on 04/23/2023, it was consistent.     Pain Inventory Average Pain 4 Pain Right Now 4 My pain is constant and aching  In the last 24 hours, has pain interfered with the following? General activity 3 Relation with others 3 Enjoyment of life 3 What TIME of day is your pain at its worst? evening Sleep (in general) Fair  Pain is worse with: bending, standing, and some activites Pain improves with: rest, heat/ice, and medication Relief from Meds:  not specified  Family History  Problem Relation Age of Onset   Diabetes Mother    Hypertension Mother    Pulmonary fibrosis Mother    Heart disease Father    Heart attack Father    Other Father        unknown medical history   Peripheral Artery Disease Father    Breast cancer Paternal Aunt        73'S   Cancer Paternal Uncle    Breast cancer Paternal Uncle        50'S   Social History   Socioeconomic History   Marital status: Married    Spouse name: Elizabeth Torres   Number of children: 3   Years of education: Not on file   Highest education level: Not on file  Occupational History   Not on file  Tobacco Use   Smoking status: Every Day    Current packs/day: 1.00    Average packs/day: 1 pack/day for 31.0 years (31.0 ttl pk-yrs)    Types: Cigarettes    Start date: 28   Smokeless tobacco: Never  Vaping Use   Vaping status: Never Used  Substance and Sexual Activity   Alcohol use: Not Currently   Drug use: Never   Sexual activity: Yes    Partners: Male   Other Topics Concern   Not on file  Social History Narrative   Not on file   Social Drivers of Health   Financial Resource Strain: Not on file  Food Insecurity: No Food Insecurity (07/14/2022)   Hunger Vital Sign    Worried About Running Out of Food in the Last Year: Never true    Ran Out of Food in the Last Year: Never true  Transportation Needs: No Transportation Needs (07/14/2022)   PRAPARE - Administrator, Civil Service (Medical): No    Lack of Transportation (Non-Medical): No  Physical Activity: Not on file  Stress: Not on file  Social Connections: Not on file   Past Surgical History:  Procedure Laterality Date   ANTERIOR CERVICAL DECOMP/DISCECTOMY FUSION N/A 07/08/2022   Procedure: C4-7 ANTERIOR CERVICAL DISCECTOMY AND FUSION (GLOBUS HEDRON);  Surgeon: Venetia Night, MD;  Location: [facility];  Service: Neurosurgery;  Laterality: N/A;   BREAST BIOPSY Right 07/2011   invasive ductal carcinoma   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 RAd and chemo tx   BREAST SURGERY     mastectomy Right    partial  with lymph node dissection   PORT A CATH REVISION     PORT-A-CATH REMOVAL  11-07-15   Dr Lemar Livings   SHOULDER ARTHROSCOPY WITH ROTATOR CUFF REPAIR Left 05/18/2019   Procedure: SHOULDER ARTHROSCOPY WITH SUBACROMIAL DECOMPRESSION AND DISTAL CLAVICAL EXCISION, INTRAARTICULAR DEBRIDEMENT;  Surgeon: Lyndle Herrlich, MD;  Location: [facility];  Service: Orthopedics;  Laterality: Left;   SPINE SURGERY  2008   Dr Jeral Fruit   TUBAL LIGATION     Past Surgical History:  Procedure Laterality Date   ANTERIOR CERVICAL DECOMP/DISCECTOMY FUSION N/A 07/08/2022   Procedure: C4-7 ANTERIOR CERVICAL DISCECTOMY AND FUSION (GLOBUS HEDRON);  Surgeon: Venetia Night, MD;  Location: [facility];  Service: Neurosurgery;  Laterality: N/A;   BREAST BIOPSY Right 07/2011   invasive ductal carcinoma   BREAST LUMPECTOMY Right 08/2011    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 RAd and chemo tx   BREAST SURGERY     mastectomy Right    partial with lymph node dissection   PORT A CATH REVISION     PORT-A-CATH REMOVAL  11-07-15   Dr Lemar Livings   SHOULDER ARTHROSCOPY WITH ROTATOR CUFF REPAIR Left 05/18/2019   Procedure: SHOULDER ARTHROSCOPY WITH SUBACROMIAL DECOMPRESSION AND DISTAL CLAVICAL EXCISION, INTRAARTICULAR DEBRIDEMENT;  Surgeon: Lyndle Herrlich, MD;  Location: [facility];  Service: Orthopedics;  Laterality: Left;   SPINE SURGERY  2008   Dr Jeral Fruit   TUBAL LIGATION     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   Personal history of chemotherapy 2013   for breast ca   Personal history of radiation therapy 2013   F/U right breast cancer    Pneumonia    Radiation 2013   BREAST CA   Status post chemotherapy 2013   BREAST CA   BP 127/84   Pulse 97   Ht 5\' 3"  (1.6 m)   Wt 157 lb 6.4 oz (71.4 kg)   SpO2 98%   BMI 27.88 kg/m   Opioid Risk Score:   Fall Risk Score:  `1  Depression screen [facility] 2/9     07/30/2023    9:14 AM 06/03/2023    9:05 AM 04/09/2023    8:45 AM 03/11/2023   10:10 AM 02/05/2023    9:45 AM 01/07/2023    9:00 AM 11/06/2022   10:19 AM  Depression screen PHQ 2/9  Decreased Interest 0 0 0 0 0 0 0  Down, Depressed, Hopeless 0 0 0 0 0 0 0  PHQ - 2 Score 0 0 0 0 0 0 0  Altered sleeping       0  Tired, decreased energy       0  Change in appetite       0  Feeling bad or failure about yourself        0  Trouble concentrating       0  Moving slowly or fidgety/restless       0  PHQ-9 Score       0  Difficult doing work/chores       Not difficult at all     Review of Systems  Musculoskeletal:  Positive for back pain.  All other systems reviewed and are negative.      Objective:   Physical Exam Vitals and  nursing note reviewed.  Constitutional:      Appearance: Normal appearance.  Cardiovascular:     Rate and Rhythm: Normal rate and regular rhythm.     Pulses: Normal pulses.     Heart sounds: Normal heart sounds.  Pulmonary:     Effort: Pulmonary effort is normal.     Breath sounds: Normal breath sounds.  Musculoskeletal:     Comments: Normal Muscle Bulk and Muscle Testing Reveals:  Upper Extremities: Full ROM and Muscle Strength 5/5 Lower Extremities: Full ROM and Muscle Strength 5/5 Arises from Chair with ease Narrow Based Gait     Skin:    General: Skin is warm and dry.  Neurological:     Mental Status: She is alert and oriented to person, place, and time.  Psychiatric:        Mood and Affect: Mood normal.        Behavior: Behavior normal.         Assessment & Plan:  1. Lumbar postlaminectomy syndrome status post L5-S1 fusion with chronic S1 radiculopathy. Continue current medication regimen with Nortriptyline. 07/30/2023. Refilled :  Hydrocodone 10/325 mg one tablet 5 times a day as needed for pain #150 07/30/2023. We will continue the opioid monitoring program, this consists of regular clinic visits, examinations, urine drug screen, pill counts as well as use of [place] Controlled Substance Reporting system. A 12 month History has been reviewed on the [place] Controlled Substance Reporting System on 07/30/2023. 2. 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 07/30/2023 3. Insomnia: Continue current medication regimen Ambien. 07/30/2023  4.Chronic Left Shoulder Pain: No complaints today: S/P Left Shoulder Arthroscopy with Rotator Cuff Repair on 05/18/2019 by Dr. Aldean Ast : Ortho Following. 07/30/2023 5. Cervicalgia/ Cervical Radiculitis: Continue Pamelor.S/P on 07/08/2022. with Dr Myer Haff:  C4-7 ANTERIOR CERVICAL DISCECTOMY AND FUSION (GLOBUS HEDRON) N/A General   We will  continue to monitor.  07/30/2023   F/U in 1 month.

## 2023-08-06 ENCOUNTER — Encounter: Payer: Self-pay | Admitting: Family Medicine

## 2023-08-06 ENCOUNTER — Ambulatory Visit (INDEPENDENT_AMBULATORY_CARE_PROVIDER_SITE_OTHER): Payer: BC Managed Care – PPO | Admitting: Family Medicine

## 2023-08-06 VITALS — BP 118/80 | HR 91 | Ht 63.0 in | Wt 157.2 lb

## 2023-08-06 DIAGNOSIS — R253 Fasciculation: Secondary | ICD-10-CM | POA: Diagnosis not present

## 2023-08-06 DIAGNOSIS — I10 Essential (primary) hypertension: Secondary | ICD-10-CM | POA: Diagnosis not present

## 2023-08-06 DIAGNOSIS — R29898 Other symptoms and signs involving the musculoskeletal system: Secondary | ICD-10-CM | POA: Diagnosis not present

## 2023-08-06 DIAGNOSIS — Z79899 Other long term (current) drug therapy: Secondary | ICD-10-CM | POA: Diagnosis not present

## 2023-08-06 MED ORDER — HYDROCHLOROTHIAZIDE 12.5 MG PO TABS: ORAL_TABLET | ORAL | 0 refills | Status: DC

## 2023-08-06 NOTE — Progress Notes (Signed)
 Date:  08/06/2023   Name:  Elizabeth Torres   DOB:  1976-07-30   MRN:  [id number]   Chief Complaint: Dysmenorrhea (Patient presents today for cramps in bil feet which happen often but worse at night, and constant twitching in her right arm. This has been going on for 3 weeks now. Patient has not taking any mediations for pain. )  Neurologic Problem The patient's pertinent negatives include no altered mental status, clumsiness, focal sensory loss, focal weakness, loss of balance, memory loss, near-syncope, slurred speech, syncope, visual change or weakness. This is a new problem. The current episode started 1 to 4 weeks ago. The neurological problem developed gradually. The problem is unchanged. There was right-sided and upper extremity focality noted. Associated symptoms include chest pain. Pertinent negatives include no abdominal pain, auditory change, aura, bladder incontinence, bowel incontinence, confusion, diaphoresis, dizziness, fatigue, fever, headaches, light-headedness, nausea, neck pain, palpitations, shortness of breath, vertigo or vomiting. Past treatments include nothing. The treatment provided mild relief.    Lab Results  Component Value Date   NA 141 06/17/2023   K 5.4 (H) 06/17/2023   CO2 24 06/17/2023   GLUCOSE 75 06/17/2023   BUN 12 06/17/2023   CREATININE 0.64 06/17/2023   CALCIUM  9.9 06/17/2023   EGFR 110 06/17/2023   GFRNONAA >60 06/29/2022   Lab Results  Component Value Date   CHOL 140 06/17/2023   HDL 77 06/17/2023   LDLCALC 51 06/17/2023   TRIG 53 06/17/2023   CHOLHDL 1.8 06/17/2023   Lab Results  Component Value Date   TSH 2.360 07/22/2021   No results found for: HGBA1C Lab Results  Component Value Date   WBC 7.3 06/29/2022   HGB 14.3 06/29/2022   HCT 41.5 06/29/2022   MCV 91.4 06/29/2022   PLT 270 06/29/2022   Lab Results  Component Value Date   ALT 24 06/17/2023   AST 28 06/17/2023   ALKPHOS 98 06/17/2023   BILITOT 0.3 06/17/2023   No  results found for: 25OHVITD2, 25OHVITD3, VD25OH   Review of Systems  Constitutional:  Negative for diaphoresis, fatigue and fever.  HENT:  Negative for drooling.   Eyes:  Negative for visual disturbance.  Respiratory:  Negative for apnea, cough, choking, chest tightness and shortness of breath.   Cardiovascular:  Positive for chest pain. Negative for palpitations and near-syncope.  Gastrointestinal:  Negative for abdominal pain, bowel incontinence, nausea and vomiting.  Endocrine: Negative for polydipsia and polyuria.  Genitourinary:  Negative for bladder incontinence and difficulty urinating.  Musculoskeletal:  Negative for neck pain.  Neurological:  Negative for dizziness, vertigo, focal weakness, syncope, weakness, light-headedness, headaches and loss of balance.  Psychiatric/Behavioral:  Negative for confusion and memory loss.     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   Right arm weakness 07/08/2022   Cervical radiculopathy 07/08/2022   S/P cervical spinal fusion 07/08/2022   Cervical radicular pain 07/24/2021   Spinal stenosis in cervical region 07/24/2021   Chronic pain syndrome 07/24/2021   Cervical spondylosis with radiculopathy 02/25/2021   Mass of soft tissue of upper arm 01/31/2021   Elevated blood-pressure reading, without diagnosis of hypertension 08/26/2020   Menorrhagia with irregular cycle 04/02/2020   Rotator cuff impingement syndrome, left 12/15/2018   Cervical myofascial pain syndrome 02/14/2016   History of breast cancer 11/07/2015   Postlaminectomy syndrome, lumbar region 04/12/2012   Chemotherapy-induced neuropathy (HCC) 04/12/2012   Primary cancer of  lower outer quadrant of right female breast (HCC) 08/07/2011    Allergies  Allergen Reactions   Penicillins Anaphylaxis   Pregabalin  Other (See Comments)    Thoughts of self harm   Sulfa Antibiotics Anaphylaxis   Ondansetron       migraines   Other    Zofran  [Ondansetron  Hcl]     migraines    Past Surgical History:  Procedure Laterality Date   ANTERIOR CERVICAL DECOMP/DISCECTOMY FUSION N/A 07/08/2022   Procedure: C4-7 ANTERIOR CERVICAL DISCECTOMY AND FUSION (GLOBUS HEDRON);  Surgeon: Clois Fret, MD;  Location: [facility];  Service: Neurosurgery;  Laterality: N/A;   BREAST BIOPSY Right 07/2011   invasive ductal carcinoma   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 RAd and chemo tx   BREAST SURGERY     mastectomy Right    partial with lymph node dissection   PORT A CATH REVISION     PORT-A-CATH REMOVAL  11-07-15   Dr Dessa   SHOULDER ARTHROSCOPY WITH ROTATOR CUFF REPAIR Left 05/18/2019   Procedure: SHOULDER ARTHROSCOPY WITH SUBACROMIAL DECOMPRESSION AND DISTAL CLAVICAL EXCISION, INTRAARTICULAR DEBRIDEMENT;  Surgeon: Leora Lynwood SAUNDERS, MD;  Location: [facility];  Service: Orthopedics;  Laterality: Left;   SPINE SURGERY  2008   Dr Leeann   TUBAL LIGATION      Social History   Tobacco Use   Smoking status: Every Day    Current packs/day: 1.00    Average packs/day: 1 pack/day for 31.0 years (31.0 ttl pk-yrs)    Types: Cigarettes    Start date: 1994   Smokeless tobacco: Never  Vaping Use   Vaping status: Never Used  Substance Use Topics   Alcohol  use: Not Currently   Drug use: Never     Medication list has been reviewed and updated.  Current Meds  Medication Sig   aspirin  EC 81 MG tablet Take 1 tablet (81 mg total) by mouth daily. Swallow whole.   atorvastatin  (LIPITOR) 20 MG tablet TAKE ONE (1) TABLET BY MOUTH DAILY   budesonide -formoterol  (SYMBICORT ) 160-4.5 MCG/ACT inhaler Inhale 2 puffs into the lungs in the morning and at bedtime.   hydrochlorothiazide  (HYDRODIURIL ) 12.5 MG tablet TAKE (1) TABLET BY MOUTH EVERY DAY   HYDROcodone -acetaminophen  (NORCO) 10-325 MG tablet Take 1 tablet by mouth 5 (five) times daily as needed.   methocarbamol   (ROBAXIN ) 500 MG tablet Take 1 tablet (500 mg total) by mouth 2 (two) times daily as needed for muscle spasms.   minoxidil  (LONITEN ) 2.5 MG tablet Take 1.25 mg by mouth 2 (two) times daily.   nortriptyline  (PAMELOR ) 10 MG capsule TAKE (3) CAPSULES AT BEDTIME AS DIRECTED   Spacer/Aero-Holding Chambers (AEROCHAMBER MV) inhaler Use as instructed   spironolactone (ALDACTONE) 100 MG tablet Take 100 mg by mouth daily.   varenicline  (CHANTIX  CONTINUING MONTH PAK) 1 MG tablet Take 1 tablet (1 mg total) by mouth 2 (two) times daily.   Varenicline  Tartrate, Starter, (CHANTIX  STARTING MONTH PAK) 0.5 MG X 11 & 1 MG X 42 TBPK Take 0.5 mg by mouth in the morning and at bedtime. Days 1-3: Take one 0.5 mg white pill each morning for 3 days, one week before quit date. Days 4-7: Increase to one 0.5 mg white pill twice a day in morning and evening for 4 days. On Day 8 (target quit date), increase to one 1 mg blue pill twice a day. Maintain this dose for a minimum of 3 months.   Vitamin D , Ergocalciferol , (DRISDOL )  1.25 MG (50000 UNIT) CAPS capsule Take 50,000 Units by mouth once a week.   zolpidem  (AMBIEN ) 10 MG tablet Take 1 tablet (10 mg total) by mouth at bedtime as needed for sleep.       08/06/2023    9:33 AM 11/06/2022   10:19 AM 10/29/2022    7:57 AM 01/20/2022   10:06 AM  GAD 7 : Generalized Anxiety Score  Nervous, Anxious, on Edge 0 0 0 0  Control/stop worrying 0 0 0 0  Worry too much - different things 0 0 0 2  Trouble relaxing 0 0 0 0  Restless 0 0 0 0  Easily annoyed or irritable 0 0 0 1  Afraid - awful might happen 0 0 0 1  Total GAD 7 Score 0 0 0 4  Anxiety Difficulty Not difficult at all Not difficult at all Not difficult at all Not difficult at all       08/06/2023    9:32 AM 07/30/2023    9:14 AM 06/03/2023    9:05 AM  Depression screen PHQ 2/9  Decreased Interest 0 0 0  Down, Depressed, Hopeless 0 0 0  PHQ - 2 Score 0 0 0  Altered sleeping 0    Tired, decreased energy 0    Change in  appetite 0    Feeling bad or failure about yourself  0    Trouble concentrating 0    Moving slowly or fidgety/restless 0    Suicidal thoughts 0    PHQ-9 Score 0    Difficult doing work/chores Not difficult at all      BP Readings from Last 3 Encounters:  08/06/23 118/80  07/30/23 127/84  06/28/23 110/80    Physical Exam Vitals and nursing note reviewed. Exam conducted with a chaperone present.  Constitutional:      General: She is not in acute distress.    Appearance: She is not diaphoretic.  HENT:     Head: Normocephalic and atraumatic.     Right Ear: External ear normal.     Left Ear: External ear normal.     Nose: Nose normal.  Eyes:     General:        Right eye: No discharge.        Left eye: No discharge.     Conjunctiva/sclera: Conjunctivae normal.     Pupils: Pupils are equal, round, and reactive to light.  Neck:     Thyroid : No thyromegaly.     Vascular: No JVD.  Cardiovascular:     Rate and Rhythm: Normal rate and regular rhythm.     Heart sounds: Normal heart sounds. No murmur heard.    No friction rub. No gallop.  Pulmonary:     Effort: Pulmonary effort is normal.     Breath sounds: Normal breath sounds. No wheezing, rhonchi or rales.  Abdominal:     General: Bowel sounds are normal.     Palpations: Abdomen is soft. There is no mass.     Tenderness: There is no abdominal tenderness. There is no guarding.  Musculoskeletal:        General: Normal range of motion.     Cervical back: Normal range of motion and neck supple.  Lymphadenopathy:     Cervical: No cervical adenopathy.  Skin:    General: Skin is warm and dry.  Neurological:     Mental Status: She is alert.     Deep Tendon Reflexes: Reflexes are normal and symmetric.  Wt Readings from Last 3 Encounters:  08/06/23 157 lb 3.2 oz (71.3 kg)  07/30/23 157 lb 6.4 oz (71.4 kg)  06/28/23 154 lb (69.9 kg)    BP 118/80   Pulse 91   Ht 5' 3 (1.6 m)   Wt 157 lb 3.2 oz (71.3 kg)   SpO2 99%    BMI 27.85 kg/m   Assessment and Plan: 1. Fasciculations of muscle (Primary) New onset.  Patient notices some twitching in the upper arm muscles which I think are fasciculations and not worry about however there is some episodes that are occurring more distally that are a little more concerning. - Ambulatory referral to Neurology  2. Focal motor finding Patient is having episodes of focal uncontrollable twitching of the hand which may be either of a focal seizure nature or perhaps fasciculation in muscle contraction and nature as well.  Will can refer to neurology for history discussion and the possibility of any further evaluation either by imaging or EEG movement possibilities. - Ambulatory referral to Neurology  3. Essential hypertension Chronic.  Controlled.  Stable.  Asymptomatic.  Blood pressure is excellent at 118/80.  Tolerating medications well.  We will check renal function panel for electrolytes and GFR.  And continue current dosing hydrochlorothiazide  12.5 mg once a day.  Will recheck patient in 6 months. - Renal Function Panel - hydrochlorothiazide  (HYDRODIURIL ) 12.5 MG tablet; TAKE (1) TABLET BY MOUTH EVERY DAY  Dispense: 90 tablet; Refill: 0     Cathryne Molt, MD

## 2023-08-07 ENCOUNTER — Encounter: Payer: Self-pay | Admitting: Family Medicine

## 2023-08-07 LAB — RENAL FUNCTION PANEL
Albumin: 4.5 g/dL (ref 3.9–4.9)
BUN/Creatinine Ratio: 17 (ref 9–23)
BUN: 11 mg/dL (ref 6–24)
CO2: 25 mmol/L (ref 20–29)
Calcium: 9.8 mg/dL (ref 8.7–10.2)
Chloride: 101 mmol/L (ref 96–106)
Creatinine, Ser: 0.66 mg/dL (ref 0.57–1.00)
Glucose: 81 mg/dL (ref 70–99)
Phosphorus: 3.1 mg/dL (ref 3.0–4.3)
Potassium: 5 mmol/L (ref 3.5–5.2)
Sodium: 140 mmol/L (ref 134–144)
eGFR: 109 mL/min/{1.73_m2} (ref >59)

## 2023-08-30 ENCOUNTER — Encounter: Payer: Self-pay | Admitting: Family Medicine

## 2023-08-30 ENCOUNTER — Encounter: Payer: Self-pay | Admitting: Student in an Organized Health Care Education/Training Program

## 2023-08-30 ENCOUNTER — Encounter: Payer: Self-pay | Admitting: Cardiovascular Disease

## 2023-08-30 DIAGNOSIS — F17200 Nicotine dependence, unspecified, uncomplicated: Secondary | ICD-10-CM

## 2023-08-30 MED ORDER — VARENICLINE TARTRATE 1 MG PO TABS: 1.0000 mg | ORAL_TABLET | Freq: Two times a day (BID) | ORAL | 2 refills | Status: DC

## 2023-08-31 NOTE — Telephone Encounter (Signed)
Please call pt to schedule an appointment for 2 weeks.  KP

## 2023-09-01 ENCOUNTER — Encounter: Payer: BC Managed Care – PPO | Attending: Physical Medicine and Rehabilitation | Admitting: Registered Nurse

## 2023-09-01 ENCOUNTER — Encounter: Payer: Self-pay | Admitting: Registered Nurse

## 2023-09-01 VITALS — BP 106/74 | HR 97 | Ht 63.0 in | Wt 154.4 lb

## 2023-09-01 DIAGNOSIS — G620 Drug-induced polyneuropathy: Secondary | ICD-10-CM | POA: Diagnosis not present

## 2023-09-01 DIAGNOSIS — T451X5A Adverse effect of antineoplastic and immunosuppressive drugs, initial encounter: Secondary | ICD-10-CM | POA: Insufficient documentation

## 2023-09-01 DIAGNOSIS — M5416 Radiculopathy, lumbar region: Secondary | ICD-10-CM | POA: Diagnosis not present

## 2023-09-01 DIAGNOSIS — Z79891 Long term (current) use of opiate analgesic: Secondary | ICD-10-CM | POA: Insufficient documentation

## 2023-09-01 DIAGNOSIS — Z5181 Encounter for therapeutic drug level monitoring: Secondary | ICD-10-CM | POA: Diagnosis not present

## 2023-09-01 DIAGNOSIS — G894 Chronic pain syndrome: Secondary | ICD-10-CM | POA: Insufficient documentation

## 2023-09-01 MED ORDER — HYDROCODONE-ACETAMINOPHEN 10-325 MG PO TABS: 1.0000 | ORAL_TABLET | Freq: Every day | ORAL | 0 refills | Status: DC | PRN

## 2023-09-01 NOTE — Progress Notes (Signed)
Subjective:    Patient ID: Elizabeth Torres, female    DOB: 28-Jul-1976, 48 y.o.   MRN: [id number]  HPI: Elizabeth Torres is a 48 y.o. female who returns for follow up appointment for chronic pain and medication refill.She states her pain is located in her lower back radiating into her bilateral lower extremities. She rates her pain 4. Her current exercise regime is walking and performing stretching exercises.  Elizabeth Torres equivalent is 50.00 MME.   UDS ordered today.     Pain Inventory Average Pain 4 Pain Right Now 4 My pain is aching  In the last 24 hours, has pain interfered with the following? General activity 0 Relation with others 0 Enjoyment of life 0 What TIME of day is your pain at its worst? varies Sleep (in general) Fair  Pain is worse with: inactivity Pain improves with: heat/ice and medication Relief from Meds: 8  Family History  Problem Relation Age of Onset   Diabetes Mother    Hypertension Mother    Pulmonary fibrosis Mother    Heart disease Father    Heart attack Father    Other Father        unknown medical history   Peripheral Artery Disease Father    Breast cancer Paternal Aunt        55'S   Cancer Paternal Uncle    Breast cancer Paternal Uncle        17'S   Social History   Socioeconomic History   Marital status: Married    Spouse name: Elizabeth Torres   Number of children: 3   Years of education: Not on file   Highest education level: Not on file  Occupational History   Not on file  Tobacco Use   Smoking status: Every Day    Current packs/day: 1.00    Average packs/day: 1 pack/day for 31.1 years (31.1 ttl pk-yrs)    Types: Cigarettes    Start date: 55   Smokeless tobacco: Never  Vaping Use   Vaping status: Never Used  Substance and Sexual Activity   Alcohol use: Not Currently   Drug use: Never   Sexual activity: Yes    Partners: Male  Other Topics Concern   Not on file  Social History Narrative   Not on file   Social Drivers of  Health   Financial Resource Strain: Not on file  Food Insecurity: No Food Insecurity (07/14/2022)   Hunger Vital Sign    Worried About Running Out of Food in the Last Year: Never true    Ran Out of Food in the Last Year: Never true  Transportation Needs: No Transportation Needs (07/14/2022)   PRAPARE - Administrator, Civil Service (Medical): No    Lack of Transportation (Non-Medical): No  Physical Activity: Not on file  Stress: Not on file  Social Connections: Not on file   Past Surgical History:  Procedure Laterality Date   ANTERIOR CERVICAL DECOMP/DISCECTOMY FUSION N/A 07/08/2022   Procedure: C4-7 ANTERIOR CERVICAL DISCECTOMY AND FUSION (GLOBUS HEDRON);  Surgeon: Venetia Night, MD;  Location: [facility];  Service: Neurosurgery;  Laterality: N/A;   BREAST BIOPSY Right 07/2011   invasive ductal carcinoma   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 RAd and chemo tx   BREAST SURGERY     mastectomy Right    partial with lymph node dissection   PORT A CATH REVISION     PORT-A-CATH REMOVAL  11-07-15   Dr Lemar Livings   SHOULDER ARTHROSCOPY WITH ROTATOR CUFF REPAIR Left 05/18/2019   Procedure: SHOULDER ARTHROSCOPY WITH SUBACROMIAL DECOMPRESSION AND DISTAL CLAVICAL EXCISION, INTRAARTICULAR DEBRIDEMENT;  Surgeon: Lyndle Herrlich, MD;  Location: [facility];  Service: Orthopedics;  Laterality: Left;   SPINE SURGERY  2008   Dr Jeral Fruit   TUBAL LIGATION     Past Surgical History:  Procedure Laterality Date   ANTERIOR CERVICAL DECOMP/DISCECTOMY FUSION N/A 07/08/2022   Procedure: C4-7 ANTERIOR CERVICAL DISCECTOMY AND FUSION (GLOBUS HEDRON);  Surgeon: Venetia Night, MD;  Location: [facility];  Service: Neurosurgery;  Laterality: N/A;   BREAST BIOPSY Right 07/2011   invasive ductal carcinoma   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 RAd and chemo tx   BREAST SURGERY     mastectomy Right     partial with lymph node dissection   PORT A CATH REVISION     PORT-A-CATH REMOVAL  11-07-15   Dr Lemar Livings   SHOULDER ARTHROSCOPY WITH ROTATOR CUFF REPAIR Left 05/18/2019   Procedure: SHOULDER ARTHROSCOPY WITH SUBACROMIAL DECOMPRESSION AND DISTAL CLAVICAL EXCISION, INTRAARTICULAR DEBRIDEMENT;  Surgeon: Lyndle Herrlich, MD;  Location: [facility];  Service: Orthopedics;  Laterality: Left;   SPINE SURGERY  2008   Dr Jeral Fruit   TUBAL LIGATION     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   Personal history of chemotherapy 2013   for breast ca   Personal history of radiation therapy 2013   F/U right breast cancer    Pneumonia    Radiation 2013   BREAST CA   Status post chemotherapy 2013   BREAST CA   BP 106/74   Pulse 97   Ht 5\' 3"  (1.6 m)   Wt 154 lb 6.4 oz (70 kg)   SpO2 98%   BMI 27.35 kg/m   Opioid Risk Score:   Fall Risk Score:  `1  Depression screen [facility] 2/9     08/06/2023    9:32 AM 07/30/2023    9:14 AM 06/03/2023    9:05 AM 04/09/2023    8:45 AM 03/11/2023   10:10 AM 02/05/2023    9:45 AM 01/07/2023    9:00 AM  Depression screen PHQ 2/9  Decreased Interest 0 0 0 0 0 0 0  Down, Depressed, Hopeless 0 0 0 0 0 0 0  PHQ - 2 Score 0 0 0 0 0 0 0  Altered sleeping 0        Tired, decreased energy 0        Change in appetite 0        Feeling bad or failure about yourself  0        Trouble concentrating 0        Moving slowly or fidgety/restless 0        Suicidal thoughts 0        PHQ-9 Score 0        Difficult doing work/chores Not difficult at all           Review of Systems  Musculoskeletal:  Positive for back pain.       Bilateral back of leg pain  All other systems reviewed and are negative.     Objective:  Physical Exam Vitals and nursing note reviewed.  Constitutional:       Appearance: Normal appearance.  Cardiovascular:     Rate and Rhythm: Normal rate and regular rhythm.     Pulses: Normal pulses.     Heart sounds: Normal heart sounds.  Pulmonary:     Effort: Pulmonary effort is normal.     Breath sounds: Normal breath sounds.  Musculoskeletal:     Comments: Normal Muscle Bulk and Muscle Testing Reveals:  Upper Extremities: Full ROM and Muscle Strength 5/5  Lower Extremities: Full ROM and Muscle Strength 5/5 Arises from Chair with ease Narrow Based  Gait     Skin:    General: Skin is warm and dry.  Neurological:     Mental Status: She is alert and oriented to person, place, and time.  Psychiatric:        Mood and Affect: Mood normal.        Behavior: Behavior normal.         Assessment & Plan:  1. Lumbar postlaminectomy syndrome status post L5-S1 fusion with chronic S1 radiculopathy. Continue current medication regimen with Nortriptyline. 09/01/2023. Refilled :  Hydrocodone 10/325 mg one tablet 5 times a day as needed for pain #150 07/30/2023. We will continue the opioid monitoring program, this consists of regular clinic visits, examinations, urine drug screen, pill counts as well as use of [place] Controlled Substance Reporting system. A 12 month History has been reviewed on the [place] Controlled Substance Reporting System on 09/01/2023. 2. 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 09/01/2023 3. Insomnia: Continue current medication regimen Ambien. 09/01/2023  4.Chronic Left Shoulder Pain: No complaints today: S/P Left Shoulder Arthroscopy with Rotator Cuff Repair on 05/18/2019 by Dr. Aldean Ast : Ortho Following. 09/01/2023 5. Cervicalgia/ Cervical Radiculitis: Continue Pamelor.S/P on 07/08/2022. with Dr Myer Haff:  C4-7 ANTERIOR CERVICAL DISCECTOMY AND FUSION (GLOBUS HEDRON) N/A General   We will continue to monitor.  09/01/2023   F/U in 1  month.

## 2023-09-06 LAB — TOXASSURE SELECT,+ANTIDEPR,UR

## 2023-09-17 ENCOUNTER — Other Ambulatory Visit: Payer: Self-pay | Admitting: Family Medicine

## 2023-09-17 ENCOUNTER — Encounter: Payer: Self-pay | Admitting: Family Medicine

## 2023-09-17 ENCOUNTER — Ambulatory Visit (INDEPENDENT_AMBULATORY_CARE_PROVIDER_SITE_OTHER): Payer: BC Managed Care – PPO | Admitting: Family Medicine

## 2023-09-17 ENCOUNTER — Ambulatory Visit: Payer: BC Managed Care – PPO

## 2023-09-17 ENCOUNTER — Ambulatory Visit
Admission: RE | Admit: 2023-09-17 | Discharge: 2023-09-17 | Disposition: A | Discharge status: Home / Self Care | Payer: BC Managed Care – PPO | Source: Ambulatory Visit | Attending: Family Medicine | Admitting: Family Medicine

## 2023-09-17 VITALS — BP 112/76 | HR 89 | Ht 63.0 in | Wt 152.2 lb

## 2023-09-17 DIAGNOSIS — F419 Anxiety disorder, unspecified: Secondary | ICD-10-CM

## 2023-09-17 DIAGNOSIS — F32A Depression, unspecified: Secondary | ICD-10-CM

## 2023-09-17 DIAGNOSIS — I10 Essential (primary) hypertension: Secondary | ICD-10-CM

## 2023-09-17 DIAGNOSIS — R5383 Other fatigue: Secondary | ICD-10-CM

## 2023-09-17 DIAGNOSIS — G930 Cerebral cysts: Secondary | ICD-10-CM | POA: Diagnosis not present

## 2023-09-17 DIAGNOSIS — L659 Nonscarring hair loss, unspecified: Secondary | ICD-10-CM

## 2023-09-17 DIAGNOSIS — E7801 Familial hypercholesterolemia: Secondary | ICD-10-CM

## 2023-09-17 DIAGNOSIS — R519 Headache, unspecified: Secondary | ICD-10-CM | POA: Diagnosis not present

## 2023-09-17 DIAGNOSIS — J324 Chronic pansinusitis: Secondary | ICD-10-CM

## 2023-09-17 MED ORDER — DOXYCYCLINE HYCLATE 100 MG PO TABS: 100.0000 mg | ORAL_TABLET | Freq: Two times a day (BID) | ORAL | 0 refills | Status: DC

## 2023-09-17 NOTE — Progress Notes (Signed)
Date:  09/17/2023   Name:  Elizabeth Torres   DOB:  September 15, 1975   MRN:  [id number]   Chief Complaint: Hypertension (Follow up fro HTN. She as been off of her medication for 2 weeks. )  Hypertension This is a chronic problem. The current episode started more than 1 year ago. The problem has been gradually worsening since onset. The problem is uncontrolled. Associated symptoms include blurred vision, headaches, malaise/fatigue and neck pain. Pertinent negatives include no anxiety, chest pain, orthopnea, palpitations, peripheral edema, PND, shortness of breath or sweats. There are no associated agents to hypertension. There are no known risk factors for coronary artery disease. Treatments tried: only spironolactone. There are no compliance problems.  There is no history of CAD/MI or CVA. Identifiable causes of hypertension include a thyroid problem. There is no history of chronic renal disease, hyperaldosteronism, hyperparathyroidism, a hypertension causing med or renovascular disease.  Thyroid Problem Presents for initial visit. Symptoms include anxiety, constipation, depressed mood, dry skin, fatigue, hair loss, heat intolerance and visual change. Patient reports no cold intolerance, diaphoresis, diarrhea, hoarse voice, leg swelling, menstrual problem, nail problem, palpitations, tremors, weight gain or weight loss. The symptoms have been worsening. The treatment provided mild relief.  Headache  This is a new problem. The current episode started 1 to 4 weeks ago. The problem occurs constantly. The problem has been unchanged. The pain is located in the Right unilateral and retro-orbital region. The pain does not radiate. The quality of the pain is described as aching and throbbing. The pain is at a severity of 7/10. The pain is moderate. Associated symptoms include blurred vision, a loss of balance, nausea, neck pain, scalp tenderness and a visual change. Pertinent negatives include no abdominal pain,  coughing, dizziness, drainage, eye watering, facial sweating, fever, hearing loss, muscle aches, numbness, phonophobia, photophobia, rhinorrhea, sinus pressure, sore throat, swollen glands, tingling, tinnitus, vomiting, weakness or weight loss. Her past medical history is significant for hypertension.    Lab Results  Component Value Date   NA 140 08/06/2023   K 5.0 08/06/2023   CO2 25 08/06/2023   GLUCOSE 81 08/06/2023   BUN 11 08/06/2023   CREATININE 0.66 08/06/2023   CALCIUM 9.8 08/06/2023   EGFR 109 08/06/2023   GFRNONAA >60 06/29/2022   Lab Results  Component Value Date   CHOL 140 06/17/2023   HDL 77 06/17/2023   LDLCALC 51 06/17/2023   TRIG 53 06/17/2023   CHOLHDL 1.8 06/17/2023   Lab Results  Component Value Date   TSH 2.360 07/22/2021   No results found for: "HGBA1C" Lab Results  Component Value Date   WBC 7.3 06/29/2022   HGB 14.3 06/29/2022   HCT 41.5 06/29/2022   MCV 91.4 06/29/2022   PLT 270 06/29/2022   Lab Results  Component Value Date   ALT 24 06/17/2023   AST 28 06/17/2023   ALKPHOS 98 06/17/2023   BILITOT 0.3 06/17/2023   No results found for: "25OHVITD2", "25OHVITD3", "VD25OH"   Review of Systems  Constitutional:  Positive for fatigue and malaise/fatigue. Negative for diaphoresis, fever, weight gain and weight loss.  HENT:  Negative for hearing loss, hoarse voice, rhinorrhea, sinus pressure, sore throat and tinnitus.   Eyes:  Positive for blurred vision and visual disturbance. Negative for photophobia.  Respiratory:  Negative for cough, chest tightness, shortness of breath and wheezing.   Cardiovascular:  Negative for chest pain, palpitations, orthopnea, leg swelling and PND.  Gastrointestinal:  Positive for constipation and  nausea. Negative for abdominal pain, blood in stool, diarrhea and vomiting.  Endocrine: Positive for heat intolerance. Negative for cold intolerance, polydipsia and polyuria.  Genitourinary:  Negative for hematuria and  menstrual problem.  Musculoskeletal:  Positive for neck pain.  Neurological:  Positive for headaches and loss of balance. Negative for dizziness, tingling, tremors, weakness and numbness.  Psychiatric/Behavioral:  The patient is nervous/anxious.     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   Right arm weakness 07/08/2022   Cervical radiculopathy 07/08/2022   S/P cervical spinal fusion 07/08/2022   Cervical radicular pain 07/24/2021   Spinal stenosis in cervical region 07/24/2021   Chronic pain syndrome 07/24/2021   Cervical spondylosis with radiculopathy 02/25/2021   Mass of soft tissue of upper arm 01/31/2021   Elevated blood-pressure reading, without diagnosis of hypertension 08/26/2020   Menorrhagia with irregular cycle 04/02/2020   Rotator cuff impingement syndrome, left 12/15/2018   Cervical myofascial pain syndrome 02/14/2016   History of breast cancer 11/07/2015   Postlaminectomy syndrome, lumbar region 04/12/2012   Chemotherapy-induced neuropathy (HCC) 04/12/2012   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 (HCC) 08/07/2011    Allergies  Allergen Reactions   Penicillins Anaphylaxis   Pregabalin Other (See Comments)    Thoughts of self harm   Sulfa Antibiotics Anaphylaxis   Ondansetron     migraines   Other    Zofran [Ondansetron Hcl]     migraines    Past Surgical History:  Procedure Laterality Date   ANTERIOR CERVICAL DECOMP/DISCECTOMY FUSION N/A 07/08/2022   Procedure: C4-7 ANTERIOR CERVICAL DISCECTOMY AND FUSION (GLOBUS HEDRON);  Surgeon: Venetia Night, MD;  Location: [facility];  Service: Neurosurgery;  Laterality: N/A;   BREAST BIOPSY Right 07/2011   invasive ductal carcinoma   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 RAd and chemo tx   BREAST SURGERY     mastectomy Right    partial with lymph node dissection   PORT A CATH  REVISION     PORT-A-CATH REMOVAL  11-07-15   Dr Lemar Livings   SHOULDER ARTHROSCOPY WITH ROTATOR CUFF REPAIR Left 05/18/2019   Procedure: SHOULDER ARTHROSCOPY WITH SUBACROMIAL DECOMPRESSION AND DISTAL CLAVICAL EXCISION, INTRAARTICULAR DEBRIDEMENT;  Surgeon: Lyndle Herrlich, MD;  Location: [facility];  Service: Orthopedics;  Laterality: Left;   SPINE SURGERY  2008   Dr Jeral Fruit   TUBAL LIGATION      Social History   Tobacco Use   Smoking status: Every Day    Current packs/day: 1.00    Average packs/day: 1 pack/day for 31.1 years (31.1 ttl pk-yrs)    Types: Cigarettes    Start date: 1994   Smokeless tobacco: Never  Vaping Use   Vaping status: Never Used  Substance Use Topics   Alcohol use: Not Currently   Drug use: Never     Medication list has been reviewed and updated.  Current Meds  Medication Sig   aspirin EC 81 MG tablet Take 1 tablet (81 mg total) by mouth daily. Swallow whole.   budesonide-formoterol (SYMBICORT) 160-4.5 MCG/ACT inhaler Inhale 2 puffs into the lungs in the morning and at bedtime.   HYDROcodone-acetaminophen (NORCO) 10-325 MG tablet Take 1 tablet by mouth 5 (five) times daily as needed.   minoxidil (LONITEN) 2.5 MG tablet Take 1.25 mg by mouth 2 (two) times daily.   nortriptyline (PAMELOR) 10 MG capsule TAKE (3) CAPSULES AT  BEDTIME AS DIRECTED   spironolactone (ALDACTONE) 100 MG tablet Take 100 mg by mouth daily.   varenicline (CHANTIX) 1 MG tablet Take 1 tablet (1 mg total) by mouth 2 (two) times daily.   Vitamin D, Ergocalciferol, (DRISDOL) 1.25 MG (50000 UNIT) CAPS capsule Take 50,000 Units by mouth once a week.   zolpidem (AMBIEN) 10 MG tablet Take 1 tablet (10 mg total) by mouth at bedtime as needed for sleep.       09/17/2023    9:49 AM 08/06/2023    9:33 AM 11/06/2022   10:19 AM 10/29/2022    7:57 AM  GAD 7 : Generalized Anxiety Score  Nervous, Anxious, on Edge  0 0 0  Control/stop worrying 3 0 0 0  Worry too much - different things 3 0 0 0   Trouble relaxing 2 0 0 0  Restless 0 0 0 0  Easily annoyed or irritable 0 0 0 0  Afraid - awful might happen 0 0 0 0  Total GAD 7 Score  0 0 0  Anxiety Difficulty Somewhat difficult Not difficult at all Not difficult at all Not difficult at all       09/17/2023    9:49 AM 08/06/2023    9:32 AM 07/30/2023    9:14 AM  Depression screen PHQ 2/9  Decreased Interest 1 0 0  Down, Depressed, Hopeless 1 0 0  PHQ - 2 Score 2 0 0  Altered sleeping 3 0   Tired, decreased energy 3 0   Change in appetite 0 0   Feeling bad or failure about yourself  0 0   Trouble concentrating 3 0   Moving slowly or fidgety/restless 0 0   Suicidal thoughts 0 0   PHQ-9 Score 11 0   Difficult doing work/chores Somewhat difficult Not difficult at all     BP Readings from Last 3 Encounters:  09/17/23 (!) 140/80  09/01/23 106/74  08/06/23 118/80    Physical Exam Vitals and nursing note reviewed.  Constitutional:      General: She is not in acute distress.    Appearance: She is not diaphoretic.  HENT:     Head: Normocephalic and atraumatic.     Right Ear: Tympanic membrane and external ear normal.     Left Ear: Tympanic membrane and external ear normal.     Nose: Nose normal.  Eyes:     General:        Right eye: No discharge.        Left eye: No discharge.     Conjunctiva/sclera: Conjunctivae normal.     Pupils: Pupils are equal, round, and reactive to light.  Neck:     Thyroid: No thyromegaly.     Vascular: No JVD.  Cardiovascular:     Rate and Rhythm: Normal rate and regular rhythm.     Heart sounds: Normal heart sounds. No murmur heard.    No friction rub. No gallop.  Pulmonary:     Effort: Pulmonary effort is normal.     Breath sounds: Normal breath sounds. No wheezing, rhonchi or rales.  Abdominal:     General: Bowel sounds are normal.     Palpations: Abdomen is soft. There is no mass.     Tenderness: There is no abdominal tenderness. There is no guarding.  Musculoskeletal:         General: Normal range of motion.     Cervical back: Normal range of motion and neck supple.  Lymphadenopathy:  Cervical: No cervical adenopathy.  Skin:    General: Skin is warm and dry.  Neurological:     Mental Status: She is alert.     Cranial Nerves: Cranial nerves 2-12 are intact. No cranial nerve deficit or facial asymmetry.     Sensory: Sensation is intact.     Motor: Motor function is intact. No weakness, tremor or atrophy.     Deep Tendon Reflexes: Reflexes are normal and symmetric.     Wt Readings from Last 3 Encounters:  09/17/23 152 lb 3.2 oz (69 kg)  09/01/23 154 lb 6.4 oz (70 kg)  08/06/23 157 lb 3.2 oz (71.3 kg)    BP (!) 140/80 (BP Location: Right Arm)   Pulse 89   Ht 5\' 3"  (1.6 m)   Wt 152 lb 3.2 oz (69 kg)   SpO2 98%   BMI 26.96 kg/m   Assessment and Plan: 1. New onset of headache in cancer patient (Primary New onset. Persistent. Waxes and wanes.  Onset 3 weeks ago with new headache which is continuous in nature and waxes and wanes to a level of 7/10.  This is also associated with blurring of vision.  Patient has a breast cancer patient and given the persistent nature and visual concerns we will refer for CT scan on a stat basis. -CT scan without contrast head 2. Essential hypertension Chronic.  Controlled.  Stable.  Recently stopped 2 medications thinking this may be contributing to hair loss.  This is not corrected the hair loss problem which is continued since then.  Patient is being followed for this by her dermatologist and is currently on spironolactone.  Blood pressure is mildly elevated initially but on recheck it was noted to be in normal range at 112/76 and we will continue with discontinuance of medication. - Comprehensive metabolic panel  3. Familial hypercholesterolemia Chronic.  Patient has been requested by Dr. Kary Kos to initiate her continue on her statin which she has decided on her own to discontinue and thinking that this may be contributing  to her hair loss although I have told her that I think it would be best to take her medication on a regular basis.  4. Fatigue, unspecified type New onset persistent.  Patient has had increasing fatigue in addition to hair loss and we will initiate a fatigue workup with TSH CBC and sed rate.  - TSH + free T4 - CBC with Differential/Platelet - Sedimentation rate - Comprehensive metabolic panel  5. Hair loss Patient is followed by dermatology and is currently on spironolactone.  6. Anxiety and depression This may be a contributing factor to her depression and anxiety with a PHQ score of 11.  GAD score of 6.  Discussed with patient that we may want to consider going on medication for depression if other labs returned in a normal range.    Elizabeth Sauer, MD

## 2023-09-18 ENCOUNTER — Encounter: Payer: Self-pay | Admitting: Family Medicine

## 2023-09-18 LAB — COMPREHENSIVE METABOLIC PANEL
ALT: 26 [IU]/L (ref 0–32)
AST: 23 [IU]/L (ref 0–40)
Albumin: 4.6 g/dL (ref 3.9–4.9)
Alkaline Phosphatase: 87 [IU]/L (ref 44–121)
BUN/Creatinine Ratio: 18 (ref 9–23)
BUN: 14 mg/dL (ref 6–24)
Bilirubin Total: <0.2 mg/dL (ref 0.0–1.2)
CO2: 25 mmol/L (ref 20–29)
Calcium: 9.9 mg/dL (ref 8.7–10.2)
Chloride: 98 mmol/L (ref 96–106)
Creatinine, Ser: 0.77 mg/dL (ref 0.57–1.00)
Globulin, Total: 2.1 g/dL (ref 1.5–4.5)
Glucose: 90 mg/dL (ref 70–99)
Potassium: 5 mmol/L (ref 3.5–5.2)
Sodium: 137 mmol/L (ref 134–144)
Total Protein: 6.7 g/dL (ref 6.0–8.5)
eGFR: 96 mL/min/{1.73_m2} (ref >59)

## 2023-09-18 LAB — CBC WITH DIFFERENTIAL/PLATELET
Basophils Absolute: 0.1 10*3/uL (ref 0.0–0.2)
Basos: 1 %
EOS (ABSOLUTE): 0.2 10*3/uL (ref 0.0–0.4)
Eos: 3 %
Hematocrit: 40.4 % (ref 34.0–46.6)
Hemoglobin: 13.7 g/dL (ref 11.1–15.9)
Immature Grans (Abs): 0 10*3/uL (ref 0.0–0.1)
Immature Granulocytes: 0 %
Lymphocytes Absolute: 1.7 10*3/uL (ref 0.7–3.1)
Lymphs: 23 %
MCH: 32.1 pg (ref 26.6–33.0)
MCHC: 33.9 g/dL (ref 31.5–35.7)
MCV: 95 fL (ref 79–97)
Monocytes Absolute: 0.6 10*3/uL (ref 0.1–0.9)
Monocytes: 8 %
Neutrophils Absolute: 5 10*3/uL (ref 1.4–7.0)
Neutrophils: 65 %
Platelets: 338 10*3/uL (ref 150–450)
RBC: 4.27 x10E6/uL (ref 3.77–5.28)
RDW: 13.1 % (ref 11.7–15.4)
WBC: 7.6 10*3/uL (ref 3.4–10.8)

## 2023-09-18 LAB — TSH+FREE T4
Free T4: 1.19 ng/dL (ref 0.82–1.77)
TSH: 1.57 u[IU]/mL (ref 0.450–4.500)

## 2023-09-18 LAB — SEDIMENTATION RATE: Sed Rate: 10 mm/h (ref 0–32)

## 2023-09-20 ENCOUNTER — Ambulatory Visit: Payer: Self-pay | Admitting: Family Medicine

## 2023-09-27 DIAGNOSIS — J322 Chronic ethmoidal sinusitis: Secondary | ICD-10-CM | POA: Diagnosis not present

## 2023-09-27 DIAGNOSIS — R519 Headache, unspecified: Secondary | ICD-10-CM | POA: Diagnosis not present

## 2023-09-27 DIAGNOSIS — J3489 Other specified disorders of nose and nasal sinuses: Secondary | ICD-10-CM | POA: Diagnosis not present

## 2023-09-28 NOTE — Progress Notes (Deleted)
 Subjective:    Patient ID: Elizabeth Torres, female    DOB: Mar 11, 1976, 48 y.o.   MRN: [id number]  HPI   Pain Inventory Average Pain {NUMBERS; 0-10:5044} Pain Right Now {NUMBERS; 0-10:5044} My pain is {PAIN DESCRIPTION:21022940}  In the last 24 hours, has pain interfered with the following? General activity {NUMBERS; 0-10:5044} Relation with others {NUMBERS; 0-10:5044} Enjoyment of life {NUMBERS; 0-10:5044} What TIME of day is your pain at its worst? {time of day:24191} Sleep (in general) {BHH GOOD/FAIR/POOR:22877}  Pain is worse with: {ACTIVITIES:21022942} Pain improves with: {PAIN IMPROVES WUJW:11914782} Relief from Meds: {NUMBERS; 0-10:5044}  Family History  Problem Relation Age of Onset   Diabetes Mother    Hypertension Mother    Pulmonary fibrosis Mother    Heart disease Father    Heart attack Father    Other Father        unknown medical history   Peripheral Artery Disease Father    Breast cancer Paternal Aunt        8'S   Cancer Paternal Uncle    Breast cancer Paternal Uncle        19'S   Social History   Socioeconomic History   Marital status: Married    Spouse name: Fendi Meinhardt   Number of children: 3   Years of education: Not on file   Highest education level: Not on file  Occupational History   Not on file  Tobacco Use   Smoking status: Every Day    Current packs/day: 1.00    Average packs/day: 1 pack/day for 31.2 years (31.2 ttl pk-yrs)    Types: Cigarettes    Start date: 1994   Smokeless tobacco: Never  Vaping Use   Vaping status: Never Used  Substance and Sexual Activity   Alcohol use: Not Currently   Drug use: Never   Sexual activity: Yes    Partners: Male  Other Topics Concern   Not on file  Social History Narrative   Not on file   Social Drivers of Health   Financial Resource Strain: Not on file  Food Insecurity: No Food Insecurity (07/14/2022)   Hunger Vital Sign    Worried About Running Out of Food in the Last Year: Never true     Ran Out of Food in the Last Year: Never true  Transportation Needs: No Transportation Needs (07/14/2022)   PRAPARE - Administrator, Civil Service (Medical): No    Lack of Transportation (Non-Medical): No  Physical Activity: Not on file  Stress: Not on file  Social Connections: Not on file   Past Surgical History:  Procedure Laterality Date   ANTERIOR CERVICAL DECOMP/DISCECTOMY FUSION N/A 07/08/2022   Procedure: C4-7 ANTERIOR CERVICAL DISCECTOMY AND FUSION (GLOBUS HEDRON);  Surgeon: Venetia Night, MD;  Location: [facility];  Service: Neurosurgery;  Laterality: N/A;   BREAST BIOPSY Right 07/2011   invasive ductal carcinoma   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 RAd and chemo tx   BREAST SURGERY     mastectomy Right    partial with lymph node dissection   PORT A CATH REVISION     PORT-A-CATH REMOVAL  11-07-15   Dr Lemar Livings   SHOULDER ARTHROSCOPY WITH ROTATOR CUFF REPAIR Left 05/18/2019   Procedure: SHOULDER ARTHROSCOPY WITH SUBACROMIAL DECOMPRESSION AND DISTAL CLAVICAL EXCISION, INTRAARTICULAR DEBRIDEMENT;  Surgeon: Lyndle Herrlich, MD;  Location: [facility];  Service: Orthopedics;  Laterality: Left;   SPINE SURGERY  2008  Dr Jeral Fruit   TUBAL LIGATION     Past Surgical History:  Procedure Laterality Date   ANTERIOR CERVICAL DECOMP/DISCECTOMY FUSION N/A 07/08/2022   Procedure: C4-7 ANTERIOR CERVICAL DISCECTOMY AND FUSION (GLOBUS HEDRON);  Surgeon: Venetia Night, MD;  Location: [facility];  Service: Neurosurgery;  Laterality: N/A;   BREAST BIOPSY Right 07/2011   invasive ductal carcinoma   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 RAd and chemo tx   BREAST SURGERY     mastectomy Right    partial with lymph node dissection   PORT A CATH REVISION     PORT-A-CATH REMOVAL  11-07-15   Dr Lemar Livings   SHOULDER ARTHROSCOPY WITH ROTATOR CUFF REPAIR Left 05/18/2019   Procedure:  SHOULDER ARTHROSCOPY WITH SUBACROMIAL DECOMPRESSION AND DISTAL CLAVICAL EXCISION, INTRAARTICULAR DEBRIDEMENT;  Surgeon: Lyndle Herrlich, MD;  Location: [facility];  Service: Orthopedics;  Laterality: Left;   SPINE SURGERY  2008   Dr Jeral Fruit   TUBAL LIGATION     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  Opioid Risk Score:   Fall Risk Score:  `1  Depression screen [facility] 2/9     09/17/2023    9:49 AM 08/06/2023    9:32 AM 07/30/2023    9:14 AM 06/03/2023    9:05 AM 04/09/2023    8:45 AM 03/11/2023   10:10 AM 02/05/2023    9:45 AM  Depression screen PHQ 2/9  Decreased Interest 1 0 0 0 0 0 0  Down, Depressed, Hopeless 1 0 0 0 0 0 0  PHQ - 2 Score 2 0 0 0 0 0 0  Altered sleeping 3 0       Tired, decreased energy 3 0       Change in appetite 0 0       Feeling bad or failure about yourself  0 0       Trouble concentrating 3 0       Moving slowly or fidgety/restless 0 0       Suicidal thoughts 0 0       PHQ-9 Score 11 0       Difficult doing work/chores Somewhat difficult Not difficult at all         Review of Systems     Objective:   Physical Exam        Assessment & Plan:

## 2023-09-29 ENCOUNTER — Telehealth: Payer: Self-pay | Admitting: Registered Nurse

## 2023-09-29 ENCOUNTER — Encounter: Payer: BC Managed Care – PPO | Admitting: Registered Nurse

## 2023-09-29 MED ORDER — HYDROCODONE-ACETAMINOPHEN 10-325 MG PO TABS
1.0000 | ORAL_TABLET | Freq: Every day | ORAL | 0 refills | Status: DC | PRN
Start: 2023-09-29 — End: 2023-10-20

## 2023-09-29 NOTE — Telephone Encounter (Signed)
 PMP was reviewed.  Hydrocodone e-scribed to pharmacy.  Ms. Grabbe appointment was changed, she is sick.  Ms. Wixted is aware via My-Chart message.

## 2023-10-06 ENCOUNTER — Ambulatory Visit: Payer: BC Managed Care – PPO

## 2023-10-14 ENCOUNTER — Ambulatory Visit: Payer: BC Managed Care – PPO | Admitting: Student in an Organized Health Care Education/Training Program

## 2023-10-19 ENCOUNTER — Ambulatory Visit
Admission: RE | Admit: 2023-10-19 | Discharge: 2023-10-19 | Disposition: A | Discharge status: Home / Self Care | Source: Ambulatory Visit | Attending: Student in an Organized Health Care Education/Training Program | Admitting: Student in an Organized Health Care Education/Training Program

## 2023-10-19 DIAGNOSIS — R911 Solitary pulmonary nodule: Secondary | ICD-10-CM | POA: Insufficient documentation

## 2023-10-19 DIAGNOSIS — J4530 Mild persistent asthma, uncomplicated: Secondary | ICD-10-CM | POA: Insufficient documentation

## 2023-10-19 DIAGNOSIS — J432 Centrilobular emphysema: Secondary | ICD-10-CM | POA: Diagnosis not present

## 2023-10-19 DIAGNOSIS — R918 Other nonspecific abnormal finding of lung field: Secondary | ICD-10-CM | POA: Diagnosis not present

## 2023-10-20 ENCOUNTER — Encounter: Payer: Self-pay | Admitting: Registered Nurse

## 2023-10-20 ENCOUNTER — Encounter: Payer: BC Managed Care – PPO | Attending: Physical Medicine and Rehabilitation | Admitting: Registered Nurse

## 2023-10-20 VITALS — BP 106/74 | HR 95 | Ht 63.0 in | Wt 150.0 lb

## 2023-10-20 DIAGNOSIS — Z79891 Long term (current) use of opiate analgesic: Secondary | ICD-10-CM | POA: Diagnosis not present

## 2023-10-20 DIAGNOSIS — T451X5A Adverse effect of antineoplastic and immunosuppressive drugs, initial encounter: Secondary | ICD-10-CM | POA: Insufficient documentation

## 2023-10-20 DIAGNOSIS — M5416 Radiculopathy, lumbar region: Secondary | ICD-10-CM | POA: Insufficient documentation

## 2023-10-20 DIAGNOSIS — G894 Chronic pain syndrome: Secondary | ICD-10-CM | POA: Diagnosis not present

## 2023-10-20 DIAGNOSIS — Z5181 Encounter for therapeutic drug level monitoring: Secondary | ICD-10-CM | POA: Diagnosis not present

## 2023-10-20 DIAGNOSIS — G620 Drug-induced polyneuropathy: Secondary | ICD-10-CM | POA: Diagnosis not present

## 2023-10-20 MED ORDER — ZOLPIDEM TARTRATE 10 MG PO TABS: 10.0000 mg | ORAL_TABLET | Freq: Every evening | ORAL | 2 refills | Status: DC | PRN

## 2023-10-20 MED ORDER — HYDROCODONE-ACETAMINOPHEN 10-325 MG PO TABS: 1.0000 | ORAL_TABLET | Freq: Every day | ORAL | 0 refills | Status: DC | PRN

## 2023-10-20 NOTE — Progress Notes (Signed)
 Subjective:    Patient ID: Elizabeth Torres, female    DOB: 02-Oct-1975, 48 y.o.   MRN: [id number]  HPI: Elizabeth Torres is a 48 y.o. female who returns for follow up appointment for chronic pain and medication refill. She states her  pain is located in her lower back radiating into her bilateral lower extremities.  She rates her pain 3. Her current exercise regime is walking and performing stretching exercises.  [name] Morphine equivalent is 50.00 MME.   Last UDS was Performed on 09/01/2023, it was consistent.      Pain Inventory Average Pain 4 Pain Right Now 3 My pain is dull and aching  In the last 24 hours, has pain interfered with the following? General activity 0 Relation with others 0 Enjoyment of life 0 What TIME of day is your pain at its worst? evening Sleep (in general) Fair  Pain is worse with: inactivity and standing Pain improves with: heat/ice and medication Relief from Meds: 7  Family History  Problem Relation Age of Onset   Diabetes Mother    Hypertension Mother    Pulmonary fibrosis Mother    Heart disease Father    Heart attack Father    Other Father        unknown medical history   Peripheral Artery Disease Father    Breast cancer Paternal Aunt        61'S   Cancer Paternal Uncle    Breast cancer Paternal Uncle        51'S   Social History   Socioeconomic History   Marital status: Married    Spouse name: Haniyyah Sakuma   Number of children: 3   Years of education: Not on file   Highest education level: Not on file  Occupational History   Not on file  Tobacco Use   Smoking status: Every Day    Current packs/day: 1.00    Average packs/day: 1 pack/day for 31.2 years (31.2 ttl pk-yrs)    Types: Cigarettes    Start date: 1994   Smokeless tobacco: Never  Vaping Use   Vaping status: Never Used  Substance and Sexual Activity   Alcohol use: Not Currently   Drug use: Never   Sexual activity: Yes    Partners: Male  Other Topics Concern   Not on file   Social History Narrative   Not on file   Social Drivers of Health   Financial Resource Strain: Not on file  Food Insecurity: No Food Insecurity (07/14/2022)   Hunger Vital Sign    Worried About Running Out of Food in the Last Year: Never true    Ran Out of Food in the Last Year: Never true  Transportation Needs: No Transportation Needs (07/14/2022)   PRAPARE - Administrator, Civil Service (Medical): No    Lack of Transportation (Non-Medical): No  Physical Activity: Not on file  Stress: Not on file  Social Connections: Not on file   Past Surgical History:  Procedure Laterality Date   ANTERIOR CERVICAL DECOMP/DISCECTOMY FUSION N/A 07/08/2022   Procedure: C4-7 ANTERIOR CERVICAL DISCECTOMY AND FUSION (GLOBUS HEDRON);  Surgeon: Venetia Night, MD;  Location: [facility];  Service: Neurosurgery;  Laterality: N/A;   BREAST BIOPSY Right 07/2011   invasive ductal carcinoma   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 RAd and chemo tx   BREAST SURGERY     mastectomy Right    partial with lymph  node dissection   PORT A CATH REVISION     PORT-A-CATH REMOVAL  11-07-15   Dr Lemar Livings   SHOULDER ARTHROSCOPY WITH ROTATOR CUFF REPAIR Left 05/18/2019   Procedure: SHOULDER ARTHROSCOPY WITH SUBACROMIAL DECOMPRESSION AND DISTAL CLAVICAL EXCISION, INTRAARTICULAR DEBRIDEMENT;  Surgeon: Lyndle Herrlich, MD;  Location: [facility];  Service: Orthopedics;  Laterality: Left;   SPINE SURGERY  2008   Dr Jeral Fruit   TUBAL LIGATION     Past Surgical History:  Procedure Laterality Date   ANTERIOR CERVICAL DECOMP/DISCECTOMY FUSION N/A 07/08/2022   Procedure: C4-7 ANTERIOR CERVICAL DISCECTOMY AND FUSION (GLOBUS HEDRON);  Surgeon: Venetia Night, MD;  Location: [facility];  Service: Neurosurgery;  Laterality: N/A;   BREAST BIOPSY Right 07/2011   invasive ductal carcinoma   BREAST LUMPECTOMY Right 08/2011   invasive ductal carcinoma and DCIS. Margins  clear. RAd and chemo tx   BREAST SURGERY     mastectomy Right    partial with lymph node dissection   PORT A CATH REVISION     PORT-A-CATH REMOVAL  11-07-15   Dr Lemar Livings   SHOULDER ARTHROSCOPY WITH ROTATOR CUFF REPAIR Left 05/18/2019   Procedure: SHOULDER ARTHROSCOPY WITH SUBACROMIAL DECOMPRESSION AND DISTAL CLAVICAL EXCISION, INTRAARTICULAR DEBRIDEMENT;  Surgeon: Lyndle Herrlich, MD;  Location: [facility];  Service: Orthopedics;  Laterality: Left;   SPINE SURGERY  2008   Dr Jeral Fruit   TUBAL LIGATION     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 2/9     09/17/2023    9:49 AM 08/06/2023    9:32 AM 07/30/2023    9:14 AM 06/03/2023    9:05 AM 04/09/2023    8:45 AM 03/11/2023   10:10 AM 02/05/2023    9:45 AM  Depression screen PHQ 2/9  Decreased Interest 1 0 0 0 0 0 0  Down, Depressed, Hopeless 1 0 0 0 0 0 0  PHQ - 2 Score 2 0 0 0 0 0 0  Altered sleeping 3 0       Tired, decreased energy 3 0       Change in appetite 0 0       Feeling bad or failure about yourself  0 0       Trouble concentrating 3 0       Moving slowly or fidgety/restless 0 0       Suicidal thoughts 0 0       PHQ-9 Score 11 0       Difficult doing work/chores Somewhat difficult Not difficult at all         Review of Systems  Musculoskeletal:  Positive for back pain.       Bilateral leg pain  All other systems  reviewed and are negative.      Objective:    Physical Exam Vitals and nursing note reviewed.  Constitutional:      Appearance: Normal appearance.  Cardiovascular:     Rate and Rhythm: Normal rate and regular rhythm.     Pulses: Normal pulses.     Heart sounds: Normal heart sounds.  Pulmonary:     Effort: Pulmonary effort is normal.     Breath sounds: Normal breath sounds.  Musculoskeletal:     Comments: Normal Muscle Bulk and Muscle Testing Reveals:  Upper Extremities: Full ROM and Muscle Strength 5/5  Lumbar Paraspinal Tenderness: L-4-L-5 Lower Extremities Gait     Skin:    General: Skin is warm and dry.  Neurological:     Mental Status: She is alert and oriented to person, place, and time.  Psychiatric:        Mood and Affect: Mood normal.        Behavior: Behavior normal.         Assessment & Plan:  1. Lumbar postlaminectomy syndrome status post L5-S1 fusion with chronic S1 radiculopathy. Continue current medication regimen with Nortriptyline. 10/20/2023. Refilled :  Hydrocodone 10/325 mg one tablet 5 times a day as needed for pain #150 10/20/2023. We will continue the opioid monitoring program, this consists of regular clinic visits, examinations, urine drug screen, pill counts as well as use of [place] Controlled Substance Reporting system. A 12 month History has been reviewed on the [place] Controlled Substance Reporting System on 10/20/2023. 2. 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 09/01/2023 3. Insomnia: Continue current medication regimen Ambien. 10/20/2023  4.Chronic Left Shoulder Pain: No complaints today: S/P Left Shoulder Arthroscopy with Rotator Cuff Repair on 05/18/2019 by Dr. Aldean Ast : Ortho Following. 10/20/2023 5. Cervicalgia/ Cervical Radiculitis: Continue Pamelor.S/P on 07/08/2022. with Dr Myer Haff:  C4-7 ANTERIOR CERVICAL DISCECTOMY AND FUSION (GLOBUS HEDRON) N/A General   We will continue to  monitor.  10/20/2023   F/U in 1 month.

## 2023-10-25 DIAGNOSIS — J322 Chronic ethmoidal sinusitis: Secondary | ICD-10-CM | POA: Diagnosis not present

## 2023-10-26 ENCOUNTER — Encounter: Payer: Self-pay | Admitting: Student in an Organized Health Care Education/Training Program

## 2023-11-08 DIAGNOSIS — J322 Chronic ethmoidal sinusitis: Secondary | ICD-10-CM | POA: Diagnosis not present

## 2023-11-24 ENCOUNTER — Encounter: Attending: Physical Medicine and Rehabilitation | Admitting: Registered Nurse

## 2023-11-24 ENCOUNTER — Encounter: Payer: Self-pay | Admitting: Registered Nurse

## 2023-11-24 VITALS — BP 109/74 | HR 89 | Ht 63.0 in | Wt 156.0 lb

## 2023-11-24 DIAGNOSIS — M5416 Radiculopathy, lumbar region: Secondary | ICD-10-CM | POA: Insufficient documentation

## 2023-11-24 DIAGNOSIS — G620 Drug-induced polyneuropathy: Secondary | ICD-10-CM | POA: Diagnosis not present

## 2023-11-24 DIAGNOSIS — Z5181 Encounter for therapeutic drug level monitoring: Secondary | ICD-10-CM | POA: Diagnosis not present

## 2023-11-24 DIAGNOSIS — G894 Chronic pain syndrome: Secondary | ICD-10-CM | POA: Diagnosis not present

## 2023-11-24 DIAGNOSIS — T451X5A Adverse effect of antineoplastic and immunosuppressive drugs, initial encounter: Secondary | ICD-10-CM | POA: Insufficient documentation

## 2023-11-24 DIAGNOSIS — Z79891 Long term (current) use of opiate analgesic: Secondary | ICD-10-CM | POA: Insufficient documentation

## 2023-11-24 DIAGNOSIS — G4700 Insomnia, unspecified: Secondary | ICD-10-CM | POA: Diagnosis not present

## 2023-11-24 MED ORDER — HYDROCODONE-ACETAMINOPHEN 10-325 MG PO TABS: 1.0000 | ORAL_TABLET | Freq: Every day | ORAL | 0 refills | Status: DC | PRN

## 2023-11-24 NOTE — Progress Notes (Signed)
 Subjective:    Patient ID: Elizabeth Torres, female    DOB: March 23, 1976, 48 y.o.   MRN: [id number]  HPI: Elizabeth Torres is a 48 y.o. female who returns for follow up appointment for chronic pain and medication refill. She states her pain is located in her neck and lower back radiating into her bilateral lower extremities. She rates her pain 5. Her current exercise regime is walking and performing stretching exercises.  Elizabeth Torres Morphine  equivalent is 50.00 MME.   Last UDS was Performed on 09/01/2023, it was consistent.      Pain Inventory Average Pain 5 Pain Right Now 5 My pain is constant, sharp, and stabbing  In the last 24 hours, has pain interfered with the following? General activity 5 Relation with others 5 Enjoyment of life 5 What TIME of day is your pain at its worst? evening Sleep (in general) Poor  Pain is worse with: inactivity Pain improves with: rest, heat/ice, and medication Relief from Meds: 4  Family History  Problem Relation Age of Onset   Diabetes Mother    Hypertension Mother    Pulmonary fibrosis Mother    Heart disease Father    Heart attack Father    Other Father        unknown medical history   Peripheral Artery Disease Father    Breast cancer Paternal Aunt        58'S   Cancer Paternal Uncle    Breast cancer Paternal Uncle        68'S   Social History   Socioeconomic History   Marital status: Married    Spouse name: Olamae Ferrara   Number of children: 3   Years of education: Not on file   Highest education level: Not on file  Occupational History   Not on file  Tobacco Use   Smoking status: Every Day    Current packs/day: 1.00    Average packs/day: 1 pack/day for 31.3 years (31.3 ttl pk-yrs)    Types: Cigarettes    Start date: 1994   Smokeless tobacco: Never  Vaping Use   Vaping status: Never Used  Substance and Sexual Activity   Alcohol  use: Not Currently   Drug use: Never   Sexual activity: Yes    Partners: Male  Other Topics Concern    Not on file  Social History Narrative   Not on file   Social Drivers of Health   Financial Resource Strain: Not on file  Food Insecurity: No Food Insecurity (07/14/2022)   Hunger Vital Sign    Worried About Running Out of Food in the Last Year: Never true    Ran Out of Food in the Last Year: Never true  Transportation Needs: No Transportation Needs (07/14/2022)   PRAPARE - Administrator, Civil Service (Medical): No    Lack of Transportation (Non-Medical): No  Physical Activity: Not on file  Stress: Not on file  Social Connections: Not on file   Past Surgical History:  Procedure Laterality Date   ANTERIOR CERVICAL DECOMP/DISCECTOMY FUSION N/A 07/08/2022   Procedure: C4-7 ANTERIOR CERVICAL DISCECTOMY AND FUSION (GLOBUS HEDRON);  Surgeon: Jodeen Munch, MD;  Location: [facility];  Service: Neurosurgery;  Laterality: N/A;   BREAST BIOPSY Right 07/2011   invasive ductal carcinoma   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 RAd and chemo tx   BREAST SURGERY     mastectomy Right    partial with lymph  node dissection   PORT A CATH REVISION     PORT-A-CATH REMOVAL  11-07-15   Dr Marquita Situ   SHOULDER ARTHROSCOPY WITH ROTATOR CUFF REPAIR Left 05/18/2019   Procedure: SHOULDER ARTHROSCOPY WITH SUBACROMIAL DECOMPRESSION AND DISTAL CLAVICAL EXCISION, INTRAARTICULAR DEBRIDEMENT;  Surgeon: Jerlyn Moons, MD;  Location: [facility];  Service: Orthopedics;  Laterality: Left;   SPINE SURGERY  2008   Dr Rica Chalet   TUBAL LIGATION     Past Surgical History:  Procedure Laterality Date   ANTERIOR CERVICAL DECOMP/DISCECTOMY FUSION N/A 07/08/2022   Procedure: C4-7 ANTERIOR CERVICAL DISCECTOMY AND FUSION (GLOBUS HEDRON);  Surgeon: Jodeen Munch, MD;  Location: [facility];  Service: Neurosurgery;  Laterality: N/A;   BREAST BIOPSY Right 07/2011   invasive ductal carcinoma   BREAST LUMPECTOMY Right 08/2011   invasive ductal carcinoma  and DCIS. Margins clear. RAd and chemo tx   BREAST SURGERY     mastectomy Right    partial with lymph node dissection   PORT A CATH REVISION     PORT-A-CATH REMOVAL  11-07-15   Dr Marquita Situ   SHOULDER ARTHROSCOPY WITH ROTATOR CUFF REPAIR Left 05/18/2019   Procedure: SHOULDER ARTHROSCOPY WITH SUBACROMIAL DECOMPRESSION AND DISTAL CLAVICAL EXCISION, INTRAARTICULAR DEBRIDEMENT;  Surgeon: Jerlyn Moons, MD;  Location: [facility];  Service: Orthopedics;  Laterality: Left;   SPINE SURGERY  2008   Dr Rica Chalet   TUBAL LIGATION     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   Personal history of chemotherapy 2013   for breast ca   Personal history of radiation therapy 2013   F/U right breast cancer    Pneumonia    Radiation 2013   BREAST CA   Status post chemotherapy 2013   BREAST CA   BP 109/74   Pulse 89   Ht 5\' 3"  (1.6 m)   Wt 156 lb (70.8 kg)   SpO2 98%   BMI 27.63 kg/m   Opioid Risk Score:   Fall Risk Score:  `1  Depression screen [facility] 2/9     10/20/2023    8:38 AM 09/17/2023    9:49 AM 08/06/2023    9:32 AM 07/30/2023    9:14 AM 06/03/2023    9:05 AM 04/09/2023    8:45 AM 03/11/2023   10:10 AM  Depression screen PHQ 2/9  Decreased Interest 0 1 0 0 0 0 0  Down, Depressed, Hopeless 0 1 0 0 0 0 0  PHQ - 2 Score 0 2 0 0 0 0 0  Altered sleeping  3 0      Tired, decreased energy  3 0      Change in appetite  0 0      Feeling bad or failure about yourself   0 0      Trouble concentrating  3 0      Moving slowly or fidgety/restless  0 0      Suicidal thoughts  0 0      PHQ-9 Score  11 0      Difficult doing work/chores  Somewhat difficult Not difficult at all        Review of Systems  Musculoskeletal:  Positive for back pain and neck pain.  All other systems reviewed and are negative.  Objective:   Physical Exam Vitals and nursing note reviewed.  Constitutional:      Appearance: Normal appearance.  Cardiovascular:     Rate and Rhythm: Normal rate and regular rhythm.     Pulses: Normal pulses.     Heart sounds: Normal heart sounds.  Pulmonary:     Effort: Pulmonary effort is normal.     Breath sounds: Normal breath sounds.  Musculoskeletal:     Comments: Normal Muscle Bulk and Muscle Testing Reveals:  Upper Extremities: Full ROM and Muscle Strength  5/5 Bilateral AC Joint Tenderness  Lower Extremities: Full ROM and Muscle Strength 5/5 Arises from Chair with ease Narrow Based  Gait     Skin:    General: Skin is warm and dry.  Neurological:     Mental Status: She is alert and oriented to person, place, and time.  Psychiatric:        Mood and Affect: Mood normal.        Behavior: Behavior normal.         Assessment & Plan:  1. Lumbar postlaminectomy syndrome status post L5-S1 fusion with chronic S1 radiculopathy. Continue current medication regimen with Nortriptyline . 11/24/2023. Refilled :  Hydrocodone  10/325 mg one tablet 5 times a day as needed for pain #150 11/24/2023. We will continue the opioid monitoring program, this consists of regular clinic visits, examinations, urine drug screen, pill counts as well as use of [place]  Controlled Substance Reporting system. A 12 month History has been reviewed on the [place]  Controlled Substance Reporting System on 11/24/2023. 2. 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 11/24/2023 3. Insomnia: Continue current medication regimen Ambien . 11/24/2023  4.Chronic Left Shoulder Pain: No complaints today: S/P Left Shoulder Arthroscopy with Rotator Cuff Repair on 05/18/2019 by Dr. Pierce Brewster : Ortho Following. 11/24/2023 5. Cervicalgia/ Cervical Radiculitis: Continue Pamelor .S/P on 07/08/2022. with Dr Mont Antis:  C4-7  ANTERIOR CERVICAL DISCECTOMY AND FUSION (GLOBUS HEDRON) N/A General   We will continue to monitor.  11/24/2023   F/U in 1 month.

## 2023-11-30 ENCOUNTER — Other Ambulatory Visit: Payer: Self-pay | Admitting: Family Medicine

## 2023-11-30 DIAGNOSIS — I10 Essential (primary) hypertension: Secondary | ICD-10-CM

## 2023-12-01 ENCOUNTER — Encounter: Payer: Self-pay | Admitting: Family Medicine

## 2023-12-01 ENCOUNTER — Other Ambulatory Visit: Payer: Self-pay

## 2023-12-01 ENCOUNTER — Encounter: Payer: Self-pay | Admitting: Neurosurgery

## 2023-12-03 ENCOUNTER — Other Ambulatory Visit: Payer: Self-pay | Admitting: Orthopedic Surgery

## 2023-12-03 DIAGNOSIS — Z981 Arthrodesis status: Secondary | ICD-10-CM

## 2023-12-03 DIAGNOSIS — M4722 Other spondylosis with radiculopathy, cervical region: Secondary | ICD-10-CM

## 2023-12-03 NOTE — Progress Notes (Unsigned)
 Referring Physician:  Clarise Crooks, MD [address] [place],  [place] [address]  Primary Physician:  Clarise Crooks, MD  History of Present Illness: [name] has a history of breast CA, chemo induced neuropathy, chronic pain syndrome.   She is s/p ACDF C4-C7 by Dr. Mont Antis on 07/08/22. She was doing well at her last visit, but she continued to smoke.   She called last week with increased pain and she is here for follow up.   Woke up 3 weeks ago with increased neck and left shoulder pain with limited ROM of her neck. Feels like she has some weakness that is more due to limited mobility. Pain is worse with trying to turn her neck and lifting. Some relief with heating pad. No known injury. No numbness or tingling.   She is taking norco and robaxin - she is on this chronically.   She quit smoking 08/14/23!  Bowel/Bladder Dysfunction: none  Conservative measures:  Physical therapy: nothing recent  Multimodal medical therapy including regular antiinflammatories: norco, robaxin   Injections:  no epidural steroid injections  Past Surgery:  ACDF C4-C7 by Dr. Mont Antis on 07/08/22 Previous lumbar surgery  [name] has no symptoms of cervical myelopathy.  The symptoms are causing a significant impact on the patient's life.   Review of Systems:  A 10 point review of systems is negative, except for the pertinent positives and negatives detailed in the HPI.  Past Medical History: 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   Personal history of chemotherapy 2013   for breast ca   Personal history of radiation therapy 2013   F/U right breast cancer    Pneumonia    Radiation 2013   BREAST CA   Status post chemotherapy 2013   BREAST CA    Past Surgical History: Past Surgical History:   Procedure Laterality Date   ANTERIOR CERVICAL DECOMP/DISCECTOMY FUSION N/A 07/08/2022   Procedure: C4-7 ANTERIOR CERVICAL DISCECTOMY AND FUSION (GLOBUS HEDRON);  Surgeon: Jodeen Munch, MD;  Location: [facility];  Service: Neurosurgery;  Laterality: N/A;   BREAST BIOPSY Right 07/2011   invasive ductal carcinoma   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 RAd and chemo tx   BREAST SURGERY     mastectomy Right    partial with lymph node dissection   PORT A CATH REVISION     PORT-A-CATH REMOVAL  11-07-15   Dr Marquita Situ   SHOULDER ARTHROSCOPY WITH ROTATOR CUFF REPAIR Left 05/18/2019   Procedure: SHOULDER ARTHROSCOPY WITH SUBACROMIAL DECOMPRESSION AND DISTAL CLAVICAL EXCISION, INTRAARTICULAR DEBRIDEMENT;  Surgeon: Jerlyn Moons, MD;  Location: [facility];  Service: Orthopedics;  Laterality: Left;   SPINE SURGERY  2008   Dr Rica Chalet   TUBAL LIGATION      Allergies: Allergies as of 12/08/2023 - Review Complete 11/24/2023  Allergen Reaction Noted   Penicillins Anaphylaxis 08/10/2011   Pregabalin  Other (See Comments) 07/08/2021   Sulfa antibiotics Anaphylaxis 08/10/2011   Ondansetron   03/12/2015   Other  08/26/2020   Zofran  [ondansetron  hcl]  02/19/2015    Medications: Outpatient Encounter Medications as of 12/08/2023  Medication Sig   aspirin  EC 81 MG tablet Take 1 tablet (81 mg total) by mouth daily. Swallow whole.  atorvastatin  (LIPITOR) 20 MG tablet TAKE ONE (1) TABLET BY MOUTH DAILY   budesonide -formoterol  (SYMBICORT ) 160-4.5 MCG/ACT inhaler Inhale 2 puffs into the lungs in the morning and at bedtime.   doxycycline  (VIBRA -TABS) 100 MG tablet Take 1 tablet (100 mg total) by mouth 2 (two) times daily.   hydrochlorothiazide  (HYDRODIURIL ) 12.5 MG tablet TAKE (1) TABLET BY MOUTH EVERY DAY   HYDROcodone -acetaminophen  (NORCO) 10-325 MG tablet Take 1 tablet by mouth 5 (five) times daily as needed.   methocarbamol  (ROBAXIN ) 500 MG tablet  Take 1 tablet (500 mg total) by mouth 2 (two) times daily as needed for muscle spasms.   minoxidil  (LONITEN ) 2.5 MG tablet Take 1.25 mg by mouth 2 (two) times daily.   nortriptyline  (PAMELOR ) 10 MG capsule TAKE (3) CAPSULES AT BEDTIME AS DIRECTED   Spacer/Aero-Holding Chambers (AEROCHAMBER MV) inhaler Use as instructed   spironolactone (ALDACTONE) 100 MG tablet Take 100 mg by mouth daily.   varenicline  (CHANTIX ) 1 MG tablet Take 1 tablet (1 mg total) by mouth 2 (two) times daily.   Vitamin D , Ergocalciferol , (DRISDOL ) 1.25 MG (50000 UNIT) CAPS capsule Take 50,000 Units by mouth once a week.   zolpidem  (AMBIEN ) 10 MG tablet Take 1 tablet (10 mg total) by mouth at bedtime as needed for sleep.   No facility-administered encounter medications on file as of 12/08/2023.    Social History: Social History   Tobacco Use   Smoking status: Every Day    Current packs/day: 1.00    Average packs/day: 1 pack/day for 31.3 years (31.3 ttl pk-yrs)    Types: Cigarettes    Start date: 1994   Smokeless tobacco: Never  Vaping Use   Vaping status: Never Used  Substance Use Topics   Alcohol  use: Not Currently   Drug use: Never    Family Medical History: Family History  Problem Relation Age of Onset   Diabetes Mother    Hypertension Mother    Pulmonary fibrosis Mother    Heart disease Father    Heart attack Father    Other Father        unknown medical history   Peripheral Artery Disease Father    Breast cancer Paternal Aunt        27'S   Cancer Paternal Uncle    Breast cancer Paternal Uncle        45'S    Physical Examination: There were no vitals filed for this visit.    Awake, alert, oriented to person, place, and time.  Speech is clear and fluent. Fund of knowledge is appropriate.   Cranial Nerves: Pupils equal round and reactive to light.  Facial tone is symmetric.    Well healed anterior cervical incision.   She has diffuse lower posterior cervical and left  trapezial tenderness.     No abnormal lesions on exposed skin.   Strength: Side Biceps Triceps Deltoid Interossei Grip Wrist Ext. Wrist Flex.  R 5 5 5 5 5 5 5   L 5 5 5 5 5 5 5    Side Iliopsoas Quads Hamstring PF DF EHL  R 5 5 5 5 5 5   L 5 5 5 5 5 5    No gross weakness in her bilateral upper extremities, but she does not give a good effort due to pain.   Reflexes are 1+ and symmetric at the biceps, brachioradialis, patella and achilles.   Hoffman's is absent.  Clonus is not present.   Bilateral upper and lower extremity sensation is intact to light touch.  She has tenderness over lateral epicondyle of right elbow. Has pain with resisted extension of long finger.   Gait is normal.     Medical Decision Making  Imaging: none  Assessment and Plan: Ms. Fooshee is s/p ACDF C4-C7 by Dr. Mont Antis on 07/08/22 and did well after surgery.   She woke up 3 weeks ago with increased neck and left shoulder pain with limited ROM of her neck. No known injury.   No recent cervical imaging. She has diffuse cervical and left trapezial tenderness on exam. She also has right tennis elbow.   Treatment options discussed with patient and following plan made:   - Cervical xrays ordered. Will message her with results.  - Medrol  dose pack for symptom relief. Reviewed dosing and side effects. Stop motrin  when on dose pack.  - She is on chronic norco and robaxin . Will take as prescribed.  - She will let me know she does with dose pack. Depending on this and imaging, may consider PT and/or further imaging.   I spent a total of 15 minutes in face-to-face and non-face-to-face activities related to this patient's care today including review of outside records, review of imaging, review of symptoms, physical exam, discussion of differential diagnosis, discussion of treatment options, and documentation.   Thank you for involving me in the care of this patient.   Lucetta Russel PA-C Dept. of Neurosurgery

## 2023-12-06 ENCOUNTER — Other Ambulatory Visit: Payer: Self-pay | Admitting: Registered Nurse

## 2023-12-08 ENCOUNTER — Ambulatory Visit (INDEPENDENT_AMBULATORY_CARE_PROVIDER_SITE_OTHER): Admitting: Orthopedic Surgery

## 2023-12-08 ENCOUNTER — Encounter: Payer: Self-pay | Admitting: Orthopedic Surgery

## 2023-12-08 ENCOUNTER — Ambulatory Visit
Admission: RE | Admit: 2023-12-08 | Discharge: 2023-12-08 | Disposition: A | Discharge status: Home / Self Care | Source: Ambulatory Visit | Attending: Orthopedic Surgery | Admitting: Orthopedic Surgery

## 2023-12-08 ENCOUNTER — Ambulatory Visit
Admission: RE | Admit: 2023-12-08 | Discharge: 2023-12-08 | Disposition: A | Discharge status: Home / Self Care | Attending: Orthopedic Surgery | Admitting: Orthopedic Surgery

## 2023-12-08 VITALS — BP 112/82 | Ht 63.0 in | Wt 156.0 lb

## 2023-12-08 DIAGNOSIS — M25512 Pain in left shoulder: Secondary | ICD-10-CM | POA: Diagnosis not present

## 2023-12-08 DIAGNOSIS — Z981 Arthrodesis status: Secondary | ICD-10-CM

## 2023-12-08 DIAGNOSIS — M542 Cervicalgia: Secondary | ICD-10-CM | POA: Diagnosis not present

## 2023-12-08 DIAGNOSIS — M4312 Spondylolisthesis, cervical region: Secondary | ICD-10-CM | POA: Diagnosis not present

## 2023-12-08 DIAGNOSIS — M4722 Other spondylosis with radiculopathy, cervical region: Secondary | ICD-10-CM | POA: Insufficient documentation

## 2023-12-08 MED ORDER — METHYLPREDNISOLONE 4 MG PO TBPK: ORAL_TABLET | ORAL | 0 refills | Status: DC

## 2023-12-08 NOTE — Patient Instructions (Signed)
 It was so nice to see you today. Thank you so much for coming in.   I ordered xrays of your neck. You can get these at [facility] (building with the white pillars) off of [address]. The address is [address], [place], [place] [address]. You do not need any appointment. I will message you with results today or tomorrow.  I sent a prescription for a steroid dose pack to help with pain and inflammation. Take as directed. Stop motrin /ibuprofen  when taking this. Can restart after pack is done.   You can continue on hydrocodone  and methocarbamol  as directed.   You likely have tennis elbow on the right- let me know if you want me to put in a referral to ortho for this.   Please let me know how you are doing with dose pack. I hope you feel better.   Please do not hesitate to call if you have any questions or concerns. You can also message me in MyChart.    Lucetta Russel PA-C [phone]     The physicians and staff at [facility] are committed to providing excellent care. You may receive a survey asking for feedback about your experience at our office. We value you your feedback and appreciate you taking the time to to fill it out. The [facility] leadership team is also available to discuss your experience in person, feel free to contact us  [phone].

## 2023-12-14 ENCOUNTER — Ambulatory Visit: Admitting: Student in an Organized Health Care Education/Training Program

## 2023-12-16 NOTE — Telephone Encounter (Signed)
 Please call and get her cervical xrays from 12/08/23 read.   Thanks!

## 2023-12-17 NOTE — Telephone Encounter (Signed)
 Cervical xrays dated 12/08/23:  FINDINGS: There are five views including AP and swimmer's lateral views, and 3 other lateral views obtained in extension, neutral and flexion.   At C2-3, there is 3 mm of anterolisthesis in flexion which resolves in the neutral and extension positions.   At C3-4, the alignment is normal in extension, with 2 mm anterolisthesis in neutral and 3 mm anterolisthesis in flexion.   The other vertebrae are normally aligned in all 3 positions. No fracture or frank traumatic listhesis is seen. No focal pathologic process.   ACDF plating is again noted with interbody hardware C4-7. No evidence of hardware failure or interval loosening.   The disc spaces are still visible. Facet joints do still appear to be patent. There mild facet and uncinate spurring changes.   The foramina are not evaluated as obliques are not performed.   There is no precervical soft tissue thickening. Lung apices are clear.   IMPRESSION: 1. 3 mm of anterolisthesis in flexion at C2-3 which resolves in the neutral and extension positions. 2. 2 mm anterolisthesis in neutral and 3 mm anterolisthesis in flexion at C3-4, resolves in extension or. 3. No fracture or frank traumatic listhesis. 4. ACDF plating with interbody hardware C4-7. No evidence of hardware failure or interval loosening.     Electronically Signed   By: Denman Fischer M.D.   On: 12/16/2023 23:35  I have personally reviewed the images and agree with the above interpretation.

## 2023-12-22 ENCOUNTER — Encounter: Admitting: Registered Nurse

## 2023-12-22 ENCOUNTER — Encounter: Attending: Physical Medicine and Rehabilitation | Admitting: Registered Nurse

## 2023-12-22 DIAGNOSIS — G4700 Insomnia, unspecified: Secondary | ICD-10-CM | POA: Insufficient documentation

## 2023-12-22 DIAGNOSIS — Z5181 Encounter for therapeutic drug level monitoring: Secondary | ICD-10-CM | POA: Insufficient documentation

## 2023-12-22 DIAGNOSIS — T451X5A Adverse effect of antineoplastic and immunosuppressive drugs, initial encounter: Secondary | ICD-10-CM | POA: Insufficient documentation

## 2023-12-22 DIAGNOSIS — G894 Chronic pain syndrome: Secondary | ICD-10-CM | POA: Insufficient documentation

## 2023-12-22 DIAGNOSIS — G620 Drug-induced polyneuropathy: Secondary | ICD-10-CM | POA: Insufficient documentation

## 2023-12-22 DIAGNOSIS — Z79891 Long term (current) use of opiate analgesic: Secondary | ICD-10-CM | POA: Insufficient documentation

## 2023-12-22 DIAGNOSIS — M5416 Radiculopathy, lumbar region: Secondary | ICD-10-CM | POA: Diagnosis not present

## 2023-12-22 MED ORDER — HYDROCODONE-ACETAMINOPHEN 10-325 MG PO TABS
1.0000 | ORAL_TABLET | Freq: Every day | ORAL | 0 refills | Status: AC | PRN
Start: 2023-12-22 — End: ?

## 2023-12-22 NOTE — Progress Notes (Unsigned)
 Subjective:    Patient ID: Elizabeth Torres, female    DOB: Dec 18, 1975, 48 y.o.   MRN: [id number]  HPI: Elizabeth Torres is a 48 y.o. female who is scheduled for Virtual Visit for chronic pain and medication refill. I connected with Elizabeth Torres at  a video enabled telemedicine application and verified that I am speaking with the correct person using two identifiers.  Location: Patient: At her Office  Provider: In the Office    I discussed the limitations of evaluation and management by telemedicine and the availability of in person appointments. The patient expressed understanding and agreed to proceed.  She states her pain is located in her neck and lower back pain radiating into her bilateral lower extremities. She rates her pain 6. Her current exercise regime is walking and performing stretching exercises.  Elizabeth Torres Morphine  equivalent is 50.00 MME.   Last UDS was Performed on 09/01/2023, it was consistent.     Pain Inventory Average Pain 6 Pain Right Now 6 My pain is constant and aching  In the last 24 hours, has pain interfered with the following? General activity 5 Relation with others 5 Enjoyment of life 4 What TIME of day is your pain at its worst? morning  Sleep (in general) Poor  Pain is worse with: standing and some activites Pain improves with: rest, pacing activities, and medication Relief from Meds: 6  Family History  Problem Relation Age of Onset   Diabetes Mother    Hypertension Mother    Pulmonary fibrosis Mother    Heart disease Father    Heart attack Father    Other Father        unknown medical history   Peripheral Artery Disease Father    Breast cancer Paternal Aunt        5'S   Cancer Paternal Uncle    Breast cancer Paternal Uncle        37'S   Social History   Socioeconomic History   Marital status: Married    Spouse name: Randy Whitener   Number of children: 3   Years of education: Not on file   Highest education level: Not on file   Occupational History   Not on file  Tobacco Use   Smoking status: Every Day    Current packs/day: 1.00    Average packs/day: 1 pack/day for 31.4 years (31.4 ttl pk-yrs)    Types: Cigarettes    Start date: 1994   Smokeless tobacco: Never  Vaping Use   Vaping status: Never Used  Substance and Sexual Activity   Alcohol  use: Not Currently   Drug use: Never   Sexual activity: Yes    Partners: Male  Other Topics Concern   Not on file  Social History Narrative   Not on file   Social Drivers of Health   Financial Resource Strain: Not on file  Food Insecurity: No Food Insecurity (07/14/2022)   Hunger Vital Sign    Worried About Running Out of Food in the Last Year: Never true    Ran Out of Food in the Last Year: Never true  Transportation Needs: No Transportation Needs (07/14/2022)   PRAPARE - Administrator, Civil Service (Medical): No    Lack of Transportation (Non-Medical): No  Physical Activity: Not on file  Stress: Not on file  Social Connections: Not on file   Past Surgical History:  Procedure Laterality Date   ANTERIOR CERVICAL DECOMP/DISCECTOMY FUSION N/A 07/08/2022  Procedure: C4-7 ANTERIOR CERVICAL DISCECTOMY AND FUSION (GLOBUS HEDRON);  Surgeon: Jodeen Munch, MD;  Location: [facility];  Service: Neurosurgery;  Laterality: N/A;   BREAST BIOPSY Right 07/2011   invasive ductal carcinoma   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 RAd and chemo tx   BREAST SURGERY     mastectomy Right    partial with lymph node dissection   PORT A CATH REVISION     PORT-A-CATH REMOVAL  11-07-15   Dr Marquita Situ   SHOULDER ARTHROSCOPY WITH ROTATOR CUFF REPAIR Left 05/18/2019   Procedure: SHOULDER ARTHROSCOPY WITH SUBACROMIAL DECOMPRESSION AND DISTAL CLAVICAL EXCISION, INTRAARTICULAR DEBRIDEMENT;  Surgeon: Jerlyn Moons, MD;  Location: [facility];  Service: Orthopedics;  Laterality: Left;   SPINE SURGERY  2008   Dr  Rica Chalet   TUBAL LIGATION     Past Surgical History:  Procedure Laterality Date   ANTERIOR CERVICAL DECOMP/DISCECTOMY FUSION N/A 07/08/2022   Procedure: C4-7 ANTERIOR CERVICAL DISCECTOMY AND FUSION (GLOBUS HEDRON);  Surgeon: Jodeen Munch, MD;  Location: [facility];  Service: Neurosurgery;  Laterality: N/A;   BREAST BIOPSY Right 07/2011   invasive ductal carcinoma   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 RAd and chemo tx   BREAST SURGERY     mastectomy Right    partial with lymph node dissection   PORT A CATH REVISION     PORT-A-CATH REMOVAL  11-07-15   Dr Marquita Situ   SHOULDER ARTHROSCOPY WITH ROTATOR CUFF REPAIR Left 05/18/2019   Procedure: SHOULDER ARTHROSCOPY WITH SUBACROMIAL DECOMPRESSION AND DISTAL CLAVICAL EXCISION, INTRAARTICULAR DEBRIDEMENT;  Surgeon: Jerlyn Moons, MD;  Location: [facility];  Service: Orthopedics;  Laterality: Left;   SPINE SURGERY  2008   Dr Rica Chalet   TUBAL LIGATION     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  Opioid Risk Score:   Fall Risk Score:  `1  Depression screen [facility] 2/9     12/22/2023    2:14 PM 10/20/2023    8:38 AM 09/17/2023    9:49 AM 08/06/2023    9:32 AM 07/30/2023    9:14 AM 06/03/2023    9:05 AM 04/09/2023    8:45 AM  Depression screen PHQ 2/9  Decreased Interest 0 0 1 0 0 0 0  Down, Depressed, Hopeless 0 0 1 0 0 0 0  PHQ - 2 Score 0 0 2 0 0 0 0  Altered sleeping 0  3 0     Tired, decreased energy 0  3 0     Change in appetite 0  0 0     Feeling  bad or failure about yourself  0  0 0     Trouble concentrating 0  3 0     Moving slowly or fidgety/restless 0  0 0     Suicidal thoughts 0  0 0  PHQ-9 Score 0  11 0     Difficult doing work/chores Not difficult at all  Somewhat difficult Not difficult at all        Review of Systems  Musculoskeletal:  Positive for arthralgias, back pain and myalgias.       Lower back radiates into legs, constant aching        Objective:   Physical Exam Vitals and nursing note reviewed.  Musculoskeletal:     Comments: No physical Exam Performed          Assessment & Plan:  1. Lumbar postlaminectomy syndrome status post L5-S1 fusion with chronic S1 radiculopathy. Continue current medication regimen with Nortriptyline . 12/22/2023. Refilled :  Hydrocodone  10/325 mg one tablet 5 times a day as needed for pain #150 12/22/2023. We will continue the opioid monitoring program, this consists of regular clinic visits, examinations, urine drug screen, pill counts as well as use of [place]  Controlled Substance Reporting system. A 12 month History has been reviewed on the   Controlled Substance Reporting System on 12/22/2023. 2. 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 12/22/2023 3. Insomnia: Continue current medication regimen Ambien . 12/22/2023  4.Chronic Left Shoulder Pain: No complaints today: S/P Left Shoulder Arthroscopy with Rotator Cuff Repair on 05/18/2019 by Dr. Pierce Brewster : Ortho Following. 12/22/2023 5. Cervicalgia/ Cervical Radiculitis: Continue Pamelor .S/P on 07/08/2022. with Dr Mont Antis:  C4-7 ANTERIOR CERVICAL DISCECTOMY AND FUSION (GLOBUS HEDRON) N/A General   We will continue to monitor.  12/22/2023   F/U in 1 month.

## 2023-12-28 ENCOUNTER — Encounter: Payer: Self-pay | Admitting: Student in an Organized Health Care Education/Training Program

## 2023-12-28 ENCOUNTER — Encounter: Payer: Self-pay | Admitting: Registered Nurse

## 2023-12-30 DIAGNOSIS — J322 Chronic ethmoidal sinusitis: Secondary | ICD-10-CM | POA: Diagnosis not present

## 2023-12-30 DIAGNOSIS — J342 Deviated nasal septum: Secondary | ICD-10-CM | POA: Diagnosis not present

## 2023-12-30 DIAGNOSIS — J310 Chronic rhinitis: Secondary | ICD-10-CM | POA: Diagnosis not present

## 2023-12-30 DIAGNOSIS — J324 Chronic pansinusitis: Secondary | ICD-10-CM | POA: Diagnosis not present

## 2024-01-09 HISTORY — PX: RHINOPLASTY: SHX2354

## 2024-01-10 DIAGNOSIS — G620 Drug-induced polyneuropathy: Secondary | ICD-10-CM | POA: Diagnosis not present

## 2024-01-10 DIAGNOSIS — M47812 Spondylosis without myelopathy or radiculopathy, cervical region: Secondary | ICD-10-CM | POA: Diagnosis not present

## 2024-01-10 DIAGNOSIS — T451X5A Adverse effect of antineoplastic and immunosuppressive drugs, initial encounter: Secondary | ICD-10-CM | POA: Diagnosis not present

## 2024-01-10 DIAGNOSIS — R251 Tremor, unspecified: Secondary | ICD-10-CM | POA: Diagnosis not present

## 2024-01-10 DIAGNOSIS — Z1331 Encounter for screening for depression: Secondary | ICD-10-CM | POA: Diagnosis not present

## 2024-01-11 ENCOUNTER — Encounter: Payer: Self-pay | Admitting: Neurosurgery

## 2024-01-11 ENCOUNTER — Other Ambulatory Visit: Payer: Self-pay | Admitting: Neurology

## 2024-01-11 DIAGNOSIS — R251 Tremor, unspecified: Secondary | ICD-10-CM

## 2024-01-14 DIAGNOSIS — M47812 Spondylosis without myelopathy or radiculopathy, cervical region: Secondary | ICD-10-CM | POA: Diagnosis not present

## 2024-01-14 DIAGNOSIS — R251 Tremor, unspecified: Secondary | ICD-10-CM | POA: Diagnosis not present

## 2024-01-16 ENCOUNTER — Ambulatory Visit
Admission: RE | Admit: 2024-01-16 | Discharge: 2024-01-16 | Disposition: A | Discharge status: Home / Self Care | Source: Ambulatory Visit | Attending: Neurology | Admitting: Neurology

## 2024-01-16 DIAGNOSIS — R251 Tremor, unspecified: Secondary | ICD-10-CM | POA: Diagnosis not present

## 2024-01-16 DIAGNOSIS — R200 Anesthesia of skin: Secondary | ICD-10-CM | POA: Diagnosis not present

## 2024-01-16 DIAGNOSIS — M4802 Spinal stenosis, cervical region: Secondary | ICD-10-CM | POA: Diagnosis not present

## 2024-01-16 DIAGNOSIS — M5021 Other cervical disc displacement,  high cervical region: Secondary | ICD-10-CM | POA: Diagnosis not present

## 2024-01-16 DIAGNOSIS — M4803 Spinal stenosis, cervicothoracic region: Secondary | ICD-10-CM | POA: Diagnosis not present

## 2024-01-18 ENCOUNTER — Encounter: Attending: Physical Medicine and Rehabilitation | Admitting: Registered Nurse

## 2024-01-18 ENCOUNTER — Encounter: Payer: Self-pay | Admitting: Registered Nurse

## 2024-01-18 VITALS — BP 117/76 | HR 90 | Ht 63.0 in | Wt 155.2 lb

## 2024-01-18 DIAGNOSIS — Z5181 Encounter for therapeutic drug level monitoring: Secondary | ICD-10-CM | POA: Insufficient documentation

## 2024-01-18 DIAGNOSIS — Z79891 Long term (current) use of opiate analgesic: Secondary | ICD-10-CM | POA: Insufficient documentation

## 2024-01-18 DIAGNOSIS — M5416 Radiculopathy, lumbar region: Secondary | ICD-10-CM | POA: Insufficient documentation

## 2024-01-18 DIAGNOSIS — G8929 Other chronic pain: Secondary | ICD-10-CM | POA: Insufficient documentation

## 2024-01-18 DIAGNOSIS — G620 Drug-induced polyneuropathy: Secondary | ICD-10-CM | POA: Insufficient documentation

## 2024-01-18 DIAGNOSIS — M542 Cervicalgia: Secondary | ICD-10-CM | POA: Insufficient documentation

## 2024-01-18 DIAGNOSIS — M961 Postlaminectomy syndrome, not elsewhere classified: Secondary | ICD-10-CM

## 2024-01-18 DIAGNOSIS — M25512 Pain in left shoulder: Secondary | ICD-10-CM | POA: Insufficient documentation

## 2024-01-18 DIAGNOSIS — G894 Chronic pain syndrome: Secondary | ICD-10-CM | POA: Insufficient documentation

## 2024-01-18 DIAGNOSIS — T451X5A Adverse effect of antineoplastic and immunosuppressive drugs, initial encounter: Secondary | ICD-10-CM | POA: Insufficient documentation

## 2024-01-18 MED ORDER — HYDROCODONE-ACETAMINOPHEN 10-325 MG PO TABS: 1.0000 | ORAL_TABLET | Freq: Every day | ORAL | 0 refills | Status: DC | PRN

## 2024-01-18 NOTE — Progress Notes (Unsigned)
 Subjective:    Patient ID: Elizabeth Torres, female    DOB: 10-17-1975, 48 y.o.   MRN: [id number]  HPI   Pain Inventory Average Pain 7 Pain Right Now 6 My pain is constant, sharp, stabbing, and aching  In the last 24 hours, has pain interfered with the following? General activity 6 Relation with others 6 Enjoyment of life 7 What TIME of day is your pain at its worst? daytime Sleep (in general) Poor  Pain is worse with: some activites Pain improves with: rest, heat/ice, and medication Relief from Meds: 4  Family History  Problem Relation Age of Onset   Diabetes Mother    Hypertension Mother    Pulmonary fibrosis Mother    Heart disease Father    Heart attack Father    Other Father        unknown medical history   Peripheral Artery Disease Father    Breast cancer Paternal Aunt        67'S   Cancer Paternal Uncle    Breast cancer Paternal Uncle        2'S   Social History   Socioeconomic History   Marital status: Married    Spouse name: Jerilee Space   Number of children: 3   Years of education: Not on file   Highest education level: Not on file  Occupational History   Not on file  Tobacco Use   Smoking status: Every Day    Current packs/day: 1.00    Average packs/day: 1 pack/day for 31.5 years (31.5 ttl pk-yrs)    Types: Cigarettes    Start date: 1994   Smokeless tobacco: Never  Vaping Use   Vaping status: Never Used  Substance and Sexual Activity   Alcohol  use: Not Currently   Drug use: Never   Sexual activity: Yes    Partners: Male  Other Topics Concern   Not on file  Social History Narrative   Not on file   Social Drivers of Health   Financial Resource Strain: Low Risk  (01/10/2024)   Received from [facility]   Overall Financial Resource Strain (CARDIA)    Difficulty of Paying Living Expenses: Not hard at all  Food Insecurity: No Food Insecurity (01/10/2024)   Received from [facility]   Hunger Vital Sign     Within the past 12 months, you worried that your food would run out before you got the money to buy more.: Never true    Within the past 12 months, the food you bought just didn't last and you didn't have money to get more.: Never true  Transportation Needs: No Transportation Needs (01/10/2024)   Received from [facility] - Transportation    In the past 12 months, has lack of transportation kept you from medical appointments or from getting medications?: No    Lack of Transportation (Non-Medical): No  Physical Activity: Not on file  Stress: Not on file  Social Connections: Not on file   Past Surgical History:  Procedure Laterality Date   ANTERIOR CERVICAL DECOMP/DISCECTOMY FUSION N/A 07/08/2022   Procedure: C4-7 ANTERIOR CERVICAL DISCECTOMY AND FUSION (GLOBUS HEDRON);  Surgeon: Jodeen Munch, MD;  Location: [facility] ORS;  Service: Neurosurgery;  Laterality: N/A;   BREAST BIOPSY Right 07/2011   invasive ductal carcinoma   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 RAd and chemo tx   BREAST SURGERY  mastectomy Right    partial with lymph node dissection   PORT A CATH REVISION     PORT-A-CATH REMOVAL  11-07-15   Dr Marquita Situ   SHOULDER ARTHROSCOPY WITH ROTATOR CUFF REPAIR Left 05/18/2019   Procedure: SHOULDER ARTHROSCOPY WITH SUBACROMIAL DECOMPRESSION AND DISTAL CLAVICAL EXCISION, INTRAARTICULAR DEBRIDEMENT;  Surgeon: Jerlyn Moons, MD;  Location: [facility];  Service: Orthopedics;  Laterality: Left;   SPINE SURGERY  2008   Dr Rica Chalet   TUBAL LIGATION     Past Surgical History:  Procedure Laterality Date   ANTERIOR CERVICAL DECOMP/DISCECTOMY FUSION N/A 07/08/2022   Procedure: C4-7 ANTERIOR CERVICAL DISCECTOMY AND FUSION (GLOBUS HEDRON);  Surgeon: Jodeen Munch, MD;  Location: [facility] ORS;  Service: Neurosurgery;  Laterality: N/A;   BREAST BIOPSY Right 07/2011   invasive ductal carcinoma   BREAST LUMPECTOMY  Right 08/2011   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 RAd and chemo tx   BREAST SURGERY     mastectomy Right    partial with lymph node dissection   PORT A CATH REVISION     PORT-A-CATH REMOVAL  11-07-15   Dr Marquita Situ   SHOULDER ARTHROSCOPY WITH ROTATOR CUFF REPAIR Left 05/18/2019   Procedure: SHOULDER ARTHROSCOPY WITH SUBACROMIAL DECOMPRESSION AND DISTAL CLAVICAL EXCISION, INTRAARTICULAR DEBRIDEMENT;  Surgeon: Jerlyn Moons, MD;  Location: [facility];  Service: Orthopedics;  Laterality: Left;   SPINE SURGERY  2008   Dr Rica Chalet   TUBAL LIGATION     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   Personal history of chemotherapy 2013   for breast ca   Personal history of radiation therapy 2013   F/U right breast cancer    Pneumonia    Radiation 2013   BREAST CA   Status post chemotherapy 2013   BREAST CA   BP 117/76   Pulse 90   Ht 5' 3 (1.6 m)   Wt 155 lb 3.2 oz (70.4 kg)   SpO2 97%   BMI 27.49 kg/m   Opioid Risk Score:   Fall Risk Score:  `1  Depression screen [facility] 2/9     12/22/2023    2:14 PM 10/20/2023    8:38 AM 09/17/2023    9:49 AM 08/06/2023    9:32 AM 07/30/2023    9:14 AM 06/03/2023    9:05 AM 04/09/2023    8:45 AM  Depression screen PHQ 2/9  Decreased Interest 0 0 1 0 0 0 0  Down, Depressed, Hopeless 0 0 1 0 0 0 0  PHQ - 2 Score 0 0 2 0 0 0 0  Altered sleeping 0  3 0     Tired, decreased energy 0  3 0     Change in appetite 0  0 0     Feeling bad or failure about yourself  0  0 0     Trouble concentrating 0  3 0     Moving slowly or fidgety/restless 0  0 0     Suicidal thoughts 0  0 0     PHQ-9 Score 0  11 0     Difficult doing work/chores Not difficult at all  Somewhat difficult Not difficult at all       Review of Systems  Musculoskeletal:  Positive for back  pain and neck pain.  All other systems reviewed and are negative.      Objective:   Physical Exam        Assessment & Plan:

## 2024-01-19 ENCOUNTER — Other Ambulatory Visit: Payer: Self-pay | Admitting: Registered Nurse

## 2024-01-19 DIAGNOSIS — M542 Cervicalgia: Secondary | ICD-10-CM | POA: Diagnosis not present

## 2024-01-19 DIAGNOSIS — G894 Chronic pain syndrome: Secondary | ICD-10-CM | POA: Diagnosis not present

## 2024-01-19 DIAGNOSIS — M961 Postlaminectomy syndrome, not elsewhere classified: Secondary | ICD-10-CM | POA: Diagnosis not present

## 2024-01-19 DIAGNOSIS — Z5181 Encounter for therapeutic drug level monitoring: Secondary | ICD-10-CM | POA: Diagnosis not present

## 2024-01-19 DIAGNOSIS — Z79891 Long term (current) use of opiate analgesic: Secondary | ICD-10-CM | POA: Diagnosis not present

## 2024-01-27 LAB — TOXASSURE SELECT,+ANTIDEPR,UR

## 2024-02-08 DIAGNOSIS — J342 Deviated nasal septum: Secondary | ICD-10-CM | POA: Diagnosis not present

## 2024-02-08 DIAGNOSIS — Z7982 Long term (current) use of aspirin: Secondary | ICD-10-CM | POA: Diagnosis not present

## 2024-02-08 DIAGNOSIS — Z885 Allergy status to narcotic agent status: Secondary | ICD-10-CM | POA: Diagnosis not present

## 2024-02-08 DIAGNOSIS — Z880 Allergy status to penicillin: Secondary | ICD-10-CM | POA: Diagnosis not present

## 2024-02-08 DIAGNOSIS — J329 Chronic sinusitis, unspecified: Secondary | ICD-10-CM | POA: Diagnosis not present

## 2024-02-08 DIAGNOSIS — I10 Essential (primary) hypertension: Secondary | ICD-10-CM | POA: Diagnosis not present

## 2024-02-08 DIAGNOSIS — J324 Chronic pansinusitis: Secondary | ICD-10-CM | POA: Diagnosis not present

## 2024-02-08 DIAGNOSIS — Z79899 Other long term (current) drug therapy: Secondary | ICD-10-CM | POA: Diagnosis not present

## 2024-02-08 DIAGNOSIS — E785 Hyperlipidemia, unspecified: Secondary | ICD-10-CM | POA: Diagnosis not present

## 2024-02-08 DIAGNOSIS — Z87891 Personal history of nicotine dependence: Secondary | ICD-10-CM | POA: Diagnosis not present

## 2024-02-08 DIAGNOSIS — Z882 Allergy status to sulfonamides status: Secondary | ICD-10-CM | POA: Diagnosis not present

## 2024-02-08 DIAGNOSIS — J3489 Other specified disorders of nose and nasal sinuses: Secondary | ICD-10-CM | POA: Diagnosis not present

## 2024-02-10 DIAGNOSIS — J310 Chronic rhinitis: Secondary | ICD-10-CM | POA: Diagnosis not present

## 2024-02-16 ENCOUNTER — Encounter: Attending: Physical Medicine and Rehabilitation | Admitting: Registered Nurse

## 2024-02-16 ENCOUNTER — Encounter: Payer: Self-pay | Admitting: Registered Nurse

## 2024-02-16 VITALS — Ht 63.0 in | Wt 153.0 lb

## 2024-02-16 DIAGNOSIS — Z5181 Encounter for therapeutic drug level monitoring: Secondary | ICD-10-CM | POA: Insufficient documentation

## 2024-02-16 DIAGNOSIS — G894 Chronic pain syndrome: Secondary | ICD-10-CM | POA: Diagnosis not present

## 2024-02-16 DIAGNOSIS — G620 Drug-induced polyneuropathy: Secondary | ICD-10-CM | POA: Insufficient documentation

## 2024-02-16 DIAGNOSIS — M5416 Radiculopathy, lumbar region: Secondary | ICD-10-CM | POA: Diagnosis not present

## 2024-02-16 DIAGNOSIS — T451X5A Adverse effect of antineoplastic and immunosuppressive drugs, initial encounter: Secondary | ICD-10-CM | POA: Diagnosis not present

## 2024-02-16 DIAGNOSIS — Z79891 Long term (current) use of opiate analgesic: Secondary | ICD-10-CM | POA: Diagnosis not present

## 2024-02-16 MED ORDER — HYDROCODONE-ACETAMINOPHEN 10-325 MG PO TABS
1.0000 | ORAL_TABLET | Freq: Every day | ORAL | 0 refills | Status: DC | PRN
Start: 2024-02-16 — End: 2024-03-14

## 2024-02-16 NOTE — Progress Notes (Signed)
 Subjective:    Patient ID: Elizabeth Torres, female    DOB: Nov 19, 1975, 48 y.o.   MRN: [id number]  HPI: Elizabeth Torres is a 48 y.o. female who is scheduled for Virtual Visit, I connected with Ms. Powell Baseman by a video enabled telemedicine application and verified that I am speaking with the correct person using two identifiers.  Location: Patient: In her Car Provider: In the office   I discussed the limitations of evaluation and management by telemedicine and the availability of in person appointments. The patient expressed understanding and agreed to proceed.  She.states her pain is located in her lower back radiating into her bilateral lower extremities. She rates her pain 4. Her current exercise regime is walking and performing stretching exercises.  Ms. Cappiello Morphine  equivalent is 50.00 MME.   Last UDS was Performed on 01/19/2024, it was consistent.    Pain Inventory Average Pain 4 Pain Right Now 4 My pain is constant and aching  In the last 24 hours, has pain interfered with the following? General activity 3 Relation with others 3 Enjoyment of life 2 What TIME of day is your pain at its worst? evening Sleep (in general) Good  Pain is worse with: some activites Pain improves with: rest and medication Relief from Meds: 7  Family History  Problem Relation Age of Onset   Diabetes Mother    Hypertension Mother    Pulmonary fibrosis Mother    Heart disease Father    Heart attack Father    Other Father        unknown medical history   Peripheral Artery Disease Father    Breast cancer Paternal Aunt        60'S   Cancer Paternal Uncle    Breast cancer Paternal Uncle        78'S   Social History   Socioeconomic History   Marital status: Married    Spouse name: Elizabeth Torres   Number of children: 3   Years of education: Not on file   Highest education level: Not on file  Occupational History   Not on file  Tobacco Use   Smoking status: Every Day    Current packs/day:  1.00    Average packs/day: 1 pack/day for 31.5 years (31.5 ttl pk-yrs)    Types: Cigarettes    Start date: 1994   Smokeless tobacco: Never  Vaping Use   Vaping status: Never Used  Substance and Sexual Activity   Alcohol  use: Not Currently   Drug use: Never   Sexual activity: Yes    Partners: Male  Other Topics Concern   Not on file  Social History Narrative   Not on file   Social Drivers of Health   Financial Resource Strain: Low Risk  (01/10/2024)   Received from [facility]   Overall Financial Resource Strain (CARDIA)    Difficulty of Paying Living Expenses: Not hard at all  Food Insecurity: No Food Insecurity (01/10/2024)   Received from [facility]   Hunger Vital Sign    Within the past 12 months, you worried that your food would run out before you got the money to buy more.: Never true    Within the past 12 months, the food you bought just didn't last and you didn't have money to get more.: Never true  Transportation Needs: No Transportation Needs (01/10/2024)   Received from [facility] - Transportation    In  the past 12 months, has lack of transportation kept you from medical appointments or from getting medications?: No    Lack of Transportation (Non-Medical): No  Physical Activity: Not on file  Stress: Not on file  Social Connections: Not on file   Past Surgical History:  Procedure Laterality Date   ANTERIOR CERVICAL DECOMP/DISCECTOMY FUSION N/A 07/08/2022   Procedure: C4-7 ANTERIOR CERVICAL DISCECTOMY AND FUSION (GLOBUS HEDRON);  Surgeon: Clois Fret, MD;  Location: [facility] OR[facility];  Service: Neurosurgery;  Laterality: N/A;   BREAST BIOPSY Right 07/2011   invasive ductal carcinoma   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 RAd and chemo tx   BREAST SURGERY     mastectomy Right    partial with lymph node dissection   PORT A CATH REVISION     PORT-A-CATH  REMOVAL  11-07-15   Dr Dessa   SHOULDER ARTHROSCOPY WITH ROTATOR CUFF REPAIR Left 05/18/2019   Procedure: SHOULDER ARTHROSCOPY WITH SUBACROMIAL DECOMPRESSION AND DISTAL CLAVICAL EXCISION, INTRAARTICULAR DEBRIDEMENT;  Surgeon: Leora Lynwood SAUNDERS, MD;  Location: [facility];  Service: Orthopedics;  Laterality: Left;   SPINE SURGERY  2008   Dr Leeann   TUBAL LIGATION     Past Surgical History:  Procedure Laterality Date   ANTERIOR CERVICAL DECOMP/DISCECTOMY FUSION N/A 07/08/2022   Procedure: C4-7 ANTERIOR CERVICAL DISCECTOMY AND FUSION (GLOBUS HEDRON);  Surgeon: Clois Fret, MD;  Location: [facility];  Service: Neurosurgery;  Laterality: N/A;   BREAST BIOPSY Right 07/2011   invasive ductal carcinoma   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 RAd and chemo tx   BREAST SURGERY     mastectomy Right    partial with lymph node dissection   PORT A CATH REVISION     PORT-A-CATH REMOVAL  11-07-15   Dr Dessa   SHOULDER ARTHROSCOPY WITH ROTATOR CUFF REPAIR Left 05/18/2019   Procedure: SHOULDER ARTHROSCOPY WITH SUBACROMIAL DECOMPRESSION AND DISTAL CLAVICAL EXCISION, INTRAARTICULAR DEBRIDEMENT;  Surgeon: Leora Lynwood SAUNDERS, MD;  Location: [facility];  Service: Orthopedics;  Laterality: Left;   SPINE SURGERY  2008   Dr Leeann   TUBAL LIGATION     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  Opioid Risk Score:   Fall Risk Score:  `1  Depression screen [facility] 2/9     02/16/2024    8:43 AM 12/22/2023    2:14  PM 10/20/2023    8:38 AM 09/17/2023    9:49 AM 08/06/2023    9:32 AM 07/30/2023    9:14 AM 06/03/2023    9:05 AM  Depression screen PHQ 2/9  Decreased Interest 0 0 0 1 0 0 0  Down, Depressed, Hopeless 0 0 0 1 0 0 0  PHQ - 2 Score 0 0 0 2 0 0 0  Altered sleeping 0 0  3 0    Tired, decreased energy 0 0  3  0    Change in appetite 0 0  0 0    Feeling bad or failure about yourself  0 0  0 0    Trouble concentrating 0 0  3 0    Moving slowly or fidgety/restless 0 0  0 0    Suicidal thoughts 0 0  0 0    PHQ-9 Score 0 0  11 0    Difficult doing work/chores  Not difficult at all  Somewhat difficult Not difficult at all        Review of Systems  Musculoskeletal:  Positive for back pain and myalgias.       Lower back and bilateral legs  All other systems reviewed and are negative.      Objective:   Physical Exam Vitals and nursing note reviewed.  Musculoskeletal:     Comments: No Physical Exam Performed: Virtual Visit           Assessment & Plan:  1. Lumbar postlaminectomy syndrome status post L5-S1 fusion with chronic S1 radiculopathy. Continue current medication regimen with Nortriptyline . 02/16/2024. Refilled :  Hydrocodone  10/325 mg one tablet 5 times a day as needed for pain #150 02/16/2024. We will continue the opioid monitoring program, this consists of regular clinic visits, examinations, urine drug screen, pill counts as well as use of [place]  Controlled Substance Reporting system. A 12 month History has been reviewed on the [place]  Controlled Substance Reporting System on 01/18/2024. 2. 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 02/16/2024 3. Insomnia: Continue current medication regimen Ambien . 02/16/2024  4.Chronic Left Shoulder Pain: S/P Left Shoulder Arthroscopy with Rotator Cuff Repair on 05/18/2019 by Dr. Gregg : Ortho Following. 02/16/2024 5. Cervicalgia/ Cervical  Radiculitis: Continue Pamelor .S/P on 07/08/2022. with Dr Clois:  C4-7 ANTERIOR CERVICAL DISCECTOMY AND FUSION (GLOBUS HEDRON) N/A General   We will continue to monitor.  02/16/2024   F/U in 1 month. Virtual Visit  Established patient Location of Patient: In her car Location of Provider: In the Office

## 2024-03-02 DIAGNOSIS — J310 Chronic rhinitis: Secondary | ICD-10-CM | POA: Diagnosis not present

## 2024-03-04 ENCOUNTER — Other Ambulatory Visit: Payer: Self-pay | Admitting: Registered Nurse

## 2024-03-11 ENCOUNTER — Encounter: Payer: Self-pay | Admitting: Cardiovascular Disease

## 2024-03-13 ENCOUNTER — Other Ambulatory Visit: Payer: Self-pay | Admitting: Registered Nurse

## 2024-03-13 NOTE — Progress Notes (Signed)
 Subjective:    Patient ID: AYDE RECORD, female    DOB: 1976-04-24, 48 y.o.   MRN: [id number]  HPI: Elizabeth Torres is a 48 y.o. female who returns for follow up appointment for chronic pain and medication refill. She states her pain is located in her lower back radiating into her bilateral lower extremities. She rates her pain 4. Her current exercise regime is walking and performing stretching exercises.  Ms. [place] Morphine  equivalent is 50.00 MME.   Last UDS was Performed on 01/19/2024, it was consistent.      Pain Inventory Average Pain 4 Pain Right Now 4 My pain is constant and aching  In the last 24 hours, has pain interfered with the following? General activity 3 Relation with others 3 Enjoyment of life 3 What TIME of day is your pain at its worst? evening Sleep (in general) Fair  Pain is worse with: standing Pain improves with: rest and medication Relief from Meds: 6  Family History  Problem Relation Age of Onset   Diabetes Mother    Hypertension Mother    Pulmonary fibrosis Mother    Heart disease Father    Heart attack Father    Other Father        unknown medical history   Peripheral Artery Disease Father    Breast cancer Paternal Aunt        10'S   Cancer Paternal Uncle    Breast cancer Paternal Uncle        26'S   Social History   Socioeconomic History   Marital status: Married    Spouse name: Hoda Hon   Number of children: 3   Years of education: Not on file   Highest education level: Not on file  Occupational History   Not on file  Tobacco Use   Smoking status: Every Day    Current packs/day: 1.00    Average packs/day: 1 pack/day for 31.6 years (31.6 ttl pk-yrs)    Types: Cigarettes    Start date: 1994   Smokeless tobacco: Never  Vaping Use   Vaping status: Never Used  Substance and Sexual Activity   Alcohol  use: Not Currently   Drug use: Never   Sexual activity: Yes    Partners: Male  Other Topics Concern   Not on file  Social History  Narrative   Not on file   Social Drivers of Health   Financial Resource Strain: Low Risk  (01/10/2024)   Received from [facility]   Overall Financial Resource Strain (CARDIA)    Difficulty of Paying Living Expenses: Not hard at all  Food Insecurity: No Food Insecurity (01/10/2024)   Received from [facility]   Hunger Vital Sign    Within the past 12 months, you worried that your food would run out before you got the money to buy more.: Never true    Within the past 12 months, the food you bought just didn't last and you didn't have money to get more.: Never true  Transportation Needs: No Transportation Needs (01/10/2024)   Received from [facility]    In the past 12 months, has lack of transportation kept you from medical appointments or from getting medications?: No    Lack of Transportation (Non-Medical): No  Physical Activity: Not on file  Stress: Not on file  Social Connections: Not on file   Past Surgical History:  Procedure Laterality Date   ANTERIOR  CERVICAL DECOMP/DISCECTOMY FUSION N/A 07/08/2022   Procedure: C4-7 ANTERIOR CERVICAL DISCECTOMY AND FUSION (GLOBUS HEDRON);  Surgeon: Clois Fret, MD;  Location: [facility];  Service: Neurosurgery;  Laterality: N/A;   BREAST BIOPSY Right 07/2011   invasive ductal carcinoma   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 RAd and chemo tx   BREAST SURGERY     mastectomy Right    partial with lymph node dissection   PORT A CATH REVISION     PORT-A-CATH REMOVAL  11-07-15   Dr Dessa   SHOULDER ARTHROSCOPY WITH ROTATOR CUFF REPAIR Left 05/18/2019   Procedure: SHOULDER ARTHROSCOPY WITH SUBACROMIAL DECOMPRESSION AND DISTAL CLAVICAL EXCISION, INTRAARTICULAR DEBRIDEMENT;  Surgeon: Leora Lynwood SAUNDERS, MD;  Location: [facility];  Service: Orthopedics;  Laterality: Left;   SPINE SURGERY  2008   Dr Leeann   TUBAL  LIGATION     Past Surgical History:  Procedure Laterality Date   ANTERIOR CERVICAL DECOMP/DISCECTOMY FUSION N/A 07/08/2022   Procedure: C4-7 ANTERIOR CERVICAL DISCECTOMY AND FUSION (GLOBUS HEDRON);  Surgeon: Clois Fret, MD;  Location: [facility];  Service: Neurosurgery;  Laterality: N/A;   BREAST BIOPSY Right 07/2011   invasive ductal carcinoma   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 RAd and chemo tx   BREAST SURGERY     mastectomy Right    partial with lymph node dissection   PORT A CATH REVISION     PORT-A-CATH REMOVAL  11-07-15   Dr Dessa   SHOULDER ARTHROSCOPY WITH ROTATOR CUFF REPAIR Left 05/18/2019   Procedure: SHOULDER ARTHROSCOPY WITH SUBACROMIAL DECOMPRESSION AND DISTAL CLAVICAL EXCISION, INTRAARTICULAR DEBRIDEMENT;  Surgeon: Leora Lynwood SAUNDERS, MD;  Location: [facility];  Service: Orthopedics;  Laterality: Left;   SPINE SURGERY  2008   Dr Leeann   TUBAL LIGATION     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  Opioid Risk Score:   Fall Risk Score:  `1  Depression screen [facility] 2/9     02/16/2024    8:43 AM 12/22/2023    2:14 PM 10/20/2023    8:38 AM 09/17/2023    9:49 AM 08/06/2023    9:32 AM 07/30/2023    9:14 AM 06/03/2023    9:05 AM  Depression screen PHQ 2/9  Decreased Interest 0 0 0 1 0 0 0  Down, Depressed, Hopeless 0 0 0 1 0 0 0  PHQ - 2 Score 0 0 0 2 0 0 0  Altered sleeping 0 0  3 0    Tired, decreased energy 0 0  3 0    Change in appetite 0 0  0 0    Feeling bad or  failure about yourself  0 0  0 0    Trouble concentrating 0 0  3 0    Moving slowly or fidgety/restless 0 0  0 0    Suicidal thoughts 0  0  0 0    PHQ-9 Score 0 0  11 0    Difficult doing work/chores  Not difficult at all  Somewhat difficult Not difficult at all      Review of Systems  Musculoskeletal:  Positive for back pain and neck pain.       Pain in both legs down to feet  All other systems reviewed and are negative.      Objective:   Physical Exam Vitals and nursing note reviewed.  Constitutional:      Appearance: Normal appearance.  Cardiovascular:     Rate and Rhythm: Normal rate and regular rhythm.     Pulses: Normal pulses.     Heart sounds: Normal heart sounds.  Pulmonary:     Effort: Pulmonary effort is normal.     Breath sounds: Normal breath sounds.  Musculoskeletal:     Comments: Normal Muscle Bulk and Muscle Testing Reveals:  Upper Extremities: Full ROM and Muscle Strength  5/5 Lower Extremities: Full ROM and Muscle Strength 5/5 Arises from Chair with ease Narrow based  Gait     Skin:    General: Skin is warm and dry.  Neurological:     Mental Status: She is alert and oriented to person, place, and time.  Psychiatric:        Mood and Affect: Mood normal.        Behavior: Behavior normal.          Assessment & Plan:  1. Lumbar postlaminectomy syndrome status post L5-S1 fusion with chronic S1 radiculopathy. Continue current medication regimen with Nortriptyline . 03/14/2024. Refilled :  Hydrocodone  10/325 mg one tablet 5 times a day as needed for pain #150 03/14/2024. We will continue the opioid monitoring program, this consists of regular clinic visits, examinations, urine drug screen, pill counts as well as use of [place]  Controlled Substance Reporting system. A 12 month History has been reviewed on the [place]  Controlled Substance Reporting System on 03/14/2024. 2. Chemotherapy-induced polyneuropathy affecting plantar surface of both  feet: Continue current medication regimen Pamelor  10 mg HS . 03/14/2024 3. Insomnia: Continue current medication regimen Ambien . 03/14/2024  4.Chronic Left Shoulder Pain: S/P Left Shoulder Arthroscopy with Rotator Cuff Repair on 05/18/2019 by Dr. Gregg : Ortho Following. 03/14/2024 5. Cervicalgia/ Cervical Radiculitis: Continue Pamelor .S/P on 07/08/2022. with Dr Clois:  C4-7 ANTERIOR CERVICAL DISCECTOMY AND FUSION (GLOBUS HEDRON) N/A General   We will continue to monitor.  03/14/2024   F/U in 1 month.

## 2024-03-14 ENCOUNTER — Encounter: Payer: Self-pay | Admitting: Registered Nurse

## 2024-03-14 ENCOUNTER — Encounter: Attending: Physical Medicine and Rehabilitation | Admitting: Registered Nurse

## 2024-03-14 VITALS — BP 107/74 | HR 98 | Ht 63.0 in | Wt 156.0 lb

## 2024-03-14 DIAGNOSIS — G894 Chronic pain syndrome: Secondary | ICD-10-CM | POA: Diagnosis not present

## 2024-03-14 DIAGNOSIS — G620 Drug-induced polyneuropathy: Secondary | ICD-10-CM | POA: Diagnosis not present

## 2024-03-14 DIAGNOSIS — Z5181 Encounter for therapeutic drug level monitoring: Secondary | ICD-10-CM | POA: Insufficient documentation

## 2024-03-14 DIAGNOSIS — T451X5A Adverse effect of antineoplastic and immunosuppressive drugs, initial encounter: Secondary | ICD-10-CM | POA: Insufficient documentation

## 2024-03-14 DIAGNOSIS — M5416 Radiculopathy, lumbar region: Secondary | ICD-10-CM | POA: Insufficient documentation

## 2024-03-14 DIAGNOSIS — Z79891 Long term (current) use of opiate analgesic: Secondary | ICD-10-CM | POA: Diagnosis not present

## 2024-03-14 MED ORDER — ZOLPIDEM TARTRATE 10 MG PO TABS: 10.0000 mg | ORAL_TABLET | Freq: Every evening | ORAL | 2 refills | Status: DC | PRN

## 2024-03-14 MED ORDER — HYDROCODONE-ACETAMINOPHEN 10-325 MG PO TABS: 1.0000 | ORAL_TABLET | Freq: Every day | ORAL | 0 refills | Status: DC | PRN

## 2024-03-29 ENCOUNTER — Ambulatory Visit
Admission: EM | Admit: 2024-03-29 | Discharge: 2024-03-29 | Disposition: A | Discharge status: Home / Self Care | Attending: Emergency Medicine | Admitting: Emergency Medicine

## 2024-03-29 ENCOUNTER — Encounter: Payer: Self-pay | Admitting: Emergency Medicine

## 2024-03-29 DIAGNOSIS — B370 Candidal stomatitis: Secondary | ICD-10-CM | POA: Insufficient documentation

## 2024-03-29 DIAGNOSIS — U071 COVID-19: Secondary | ICD-10-CM | POA: Insufficient documentation

## 2024-03-29 LAB — GROUP A STREP BY PCR: Group A Strep by PCR: NOT DETECTED

## 2024-03-29 LAB — SARS CORONAVIRUS 2 BY RT PCR: SARS Coronavirus 2 by RT PCR: POSITIVE — AB

## 2024-03-29 MED ORDER — BENZONATATE 100 MG PO CAPS: 200.0000 mg | ORAL_CAPSULE | Freq: Three times a day (TID) | ORAL | 0 refills | Status: DC

## 2024-03-29 MED ORDER — IPRATROPIUM BROMIDE 0.06 % NA SOLN: 2.0000 | Freq: Four times a day (QID) | NASAL | 12 refills | Status: AC

## 2024-03-29 MED ORDER — NYSTATIN 100000 UNIT/ML MT SUSP: 500000.0000 [IU] | Freq: Four times a day (QID) | OROMUCOSAL | 0 refills | Status: DC

## 2024-03-29 MED ORDER — PROMETHAZINE-DM 6.25-15 MG/5ML PO SYRP: 5.0000 mL | ORAL_SOLUTION | Freq: Four times a day (QID) | ORAL | 0 refills | Status: DC | PRN

## 2024-03-29 NOTE — ED Triage Notes (Signed)
 Patient states that fever started last night, no meds today  Bilateral ear pain this morning Sx started sat  Sore throat Headache Bodyaches

## 2024-03-29 NOTE — Discharge Instructions (Addendum)
 CDC guidelines state that you must wear a mask for the first 5 days of symptoms when you are around other people.  After 5 days you no longer need to mask as you are no longer considered infectious.  There is no longer need to quarantine unless you have a fever.  If you do have a fever then you need to quarantine until you have been fever free for 24 hours without taking Tylenol  and/or ibuprofen .  Use over-the-counter Tylenol  and/or ibuprofen  according to the package instructions as needed for fever and pain.  Use the Atrovent  nasal spray, 2 squirts up each nostril every 6 hours, as needed for nasal congestion and runny nose.  Use the Tessalon  Perles every 8 hours during the day as needed for cough.  Take them with a small sip of water.  You may experience numbness to the base of your tongue or metallic taste in her mouth, this is normal.  Use the Promethazine  DM cough syrup at bedtime as needed for cough and congestion.  Be mindful this medication will make you sleepy.  I suspect that the white patch on the roof of your mouth is thrush secondary to your Symbicort  inhaler.  Make sure that you are rinsing your mouth thoroughly, or brushing your teeth, following use of your Symbicort  inhaler to prevent further outbreaks of thrush.  Swish and spit 1 teaspoon of nystatin  4 times daily for the next week to help resolve the thrush.  If you develop any worsening respiratory symptoms such as shortness of breath, shortness of breath at rest, feel as though you cannot catch your breath, you are unable to speak in full sentences, or, as a late sign, your lips begin turning blue you need to call 911 and go to the ER for evaluation.

## 2024-03-29 NOTE — ED Provider Notes (Signed)
 [facility]    CSN: [id number] Arrival date & time: 03/29/24  0904      History   Chief Complaint Chief Complaint  Patient presents with   Sore Throat   Otalgia   Fever    HPI AMANAT HACKEL is a 48 y.o. female.   HPI  48 year old female with past medical history significant for breast cancer in the right breast status postlumpectomy, chemo, and radiation.  Chemo induced neuropathy, chronic pain syndrome, and multiple orthopedic issues presents for evaluation of respiratory symptoms that began 4 days ago and consist of headache, body aches, and sore throat.  She developed a fever last night and bilateral ear pain this morning.  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   Right arm weakness 07/08/2022   Cervical radiculopathy 07/08/2022   S/P cervical spinal fusion 07/08/2022   Cervical radicular pain 07/24/2021   Spinal stenosis in cervical region 07/24/2021   Chronic pain syndrome 07/24/2021   Cervical spondylosis with radiculopathy 02/25/2021   Mass of soft tissue of upper arm 01/31/2021   Elevated blood-pressure reading, without diagnosis of hypertension 08/26/2020   Menorrhagia with irregular cycle 04/02/2020   Rotator cuff impingement syndrome, left 12/15/2018   Cervical myofascial pain syndrome 02/14/2016   History of breast cancer 11/07/2015   Postlaminectomy syndrome, lumbar region 04/12/2012   Chemotherapy-induced neuropathy (HCC) 04/12/2012   Primary  cancer of lower outer quadrant of right female breast (HCC) 08/07/2011    Past Surgical History:  Procedure Laterality Date   ANTERIOR CERVICAL DECOMP/DISCECTOMY FUSION N/A 07/08/2022   Procedure: C4-7 ANTERIOR CERVICAL DISCECTOMY AND FUSION (GLOBUS HEDRON);  Surgeon: Clois Fret, MD;  Location: [facility];  Service: Neurosurgery;  Laterality: N/A;   BREAST BIOPSY Right 07/2011   invasive ductal carcinoma   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 RAd and chemo tx   BREAST SURGERY     mastectomy Right    partial with lymph node dissection   PORT A CATH REVISION     PORT-A-CATH REMOVAL  11-07-15   Dr Dessa   SHOULDER ARTHROSCOPY WITH ROTATOR CUFF REPAIR Left 05/18/2019   Procedure: SHOULDER ARTHROSCOPY WITH SUBACROMIAL DECOMPRESSION AND DISTAL CLAVICAL EXCISION, INTRAARTICULAR DEBRIDEMENT;  Surgeon: Leora Lynwood JONELLE, MD;  Location: [facility];  Service: Orthopedics;  Laterality: Left;   SPINE SURGERY  2008   Dr Leeann   TUBAL LIGATION      OB History     Gravida  3   Para      Term      Preterm      AB      Living  3      SAB      IAB      Ectopic      Multiple      Live Births  Home Medications    Prior to Admission medications   Medication Sig Start Date End Date Taking? Authorizing Provider  aspirin  EC 81 MG tablet Take 1 tablet (81 mg total) by mouth daily. Swallow whole. 09/16/22  Yes Darron Deatrice LABOR, MD  atorvastatin  (LIPITOR) 20 MG tablet TAKE ONE (1) TABLET BY MOUTH DAILY 07/05/23  Yes Darron Deatrice LABOR, MD  benzonatate  (TESSALON ) 100 MG capsule Take 2 capsules (200 mg total) by mouth every 8 (eight) hours. 03/29/24  Yes Bernardino Ditch, NP  budesonide -formoterol  (SYMBICORT ) 160-4.5 MCG/ACT inhaler Inhale 2 puffs into the lungs in the morning and at bedtime. 06/28/23  Yes Dgayli, Belva, MD  fluticasone (FLONASE) 50 MCG/ACT nasal spray Place 2 sprays into both nostrils daily.  02/14/24  Yes [provider]  HYDROcodone -acetaminophen  (NORCO) 10-325 MG tablet Take 1 tablet by mouth 5 (five) times daily as needed. 03/14/24  Yes Debby Genre L, NP  ipratropium (ATROVENT ) 0.06 % nasal spray Place 2 sprays into both nostrils 4 (four) times daily. 03/29/24  Yes Bernardino Ditch, NP  nortriptyline  (PAMELOR ) 10 MG capsule TAKE (3) CAPSULES AT BEDTIME AS DIRECTED 03/06/24  Yes Debby Genre CROME, NP  nystatin  (MYCOSTATIN ) 100000 UNIT/ML suspension Take 5 mLs (500,000 Units total) by mouth 4 (four) times daily. 03/29/24  Yes Bernardino Ditch, NP  promethazine -dextromethorphan (PROMETHAZINE -DM) 6.25-15 MG/5ML syrup Take 5 mLs by mouth 4 (four) times daily as needed. 03/29/24  Yes Bernardino Ditch, NP  spironolactone (ALDACTONE) 100 MG tablet Take 100 mg by mouth daily. 06/23/23  Yes [provider]  zolpidem  (AMBIEN ) 10 MG tablet Take 1 tablet (10 mg total) by mouth at bedtime as needed for sleep. 03/14/24  Yes Debby Genre CROME, NP  hydrochlorothiazide  (HYDRODIURIL ) 12.5 MG tablet TAKE (1) TABLET BY MOUTH EVERY DAY Patient not taking: Reported on 03/14/2024 12/01/23   Joshua Cathryne BROCKS, MD  methocarbamol  (ROBAXIN ) 500 MG tablet TAKE (1) TABLET BY MOUTH TWICE DAILY AS NEEDED FOR MUSCLE SPASM 03/14/24   Debby Genre CROME, NP  Spacer/Aero-Holding Raguel (AEROCHAMBER MV) inhaler Use as instructed 06/28/23   Isadora Belva, MD    Family History Family History  Problem Relation Age of Onset   Diabetes Mother    Hypertension Mother    Pulmonary fibrosis Mother    Heart disease Father    Heart attack Father    Other Father        unknown medical history   Peripheral Artery Disease Father    Breast cancer Paternal Aunt        42'S   Cancer Paternal Uncle    Breast cancer Paternal Uncle        38'S    Social History Social History   Tobacco Use   Smoking status: Every Day    Current packs/day: 1.00    Average packs/day: 1 pack/day for 31.7 years (31.7 ttl pk-yrs)    Types:  Cigarettes    Start date: 1994   Smokeless tobacco: Never  Vaping Use   Vaping status: Never Used  Substance Use Topics   Alcohol  use: Not Currently   Drug use: Never     Allergies   Penicillins, Pregabalin , Sulfa antibiotics, Ondansetron , Other, and Zofran  [ondansetron  hcl]   Review of Systems Review of Systems  Constitutional:  Positive for fever.       Tmax of 102.4 last night  HENT:  Positive for ear pain and sore throat. Negative for congestion and rhinorrhea.   Respiratory:  Positive for cough. Negative for shortness of breath  and wheezing.   Musculoskeletal:  Positive for arthralgias and myalgias.  Skin:  Negative for rash.  Neurological:  Positive for headaches.     Physical Exam Triage Vital Signs ED Triage Vitals  Encounter Vitals Group     BP      Girls Systolic BP Percentile      Girls Diastolic BP Percentile      Boys Systolic BP Percentile      Boys Diastolic BP Percentile      Pulse      Resp      Temp      Temp src      SpO2      Weight      Height      Head Circumference      Peak Flow      Pain Score      Pain Loc      Pain Education      Exclude from Growth Chart    No data found.  Updated Vital Signs BP 109/75 (BP Location: Left Arm)   Pulse (!) 103   Temp 99.1 F (37.3 C) (Oral)   Resp 18   Wt 155 lb (70.3 kg)   SpO2 95%   BMI 27.46 kg/m   Visual Acuity Right Eye Distance:   Left Eye Distance:   Bilateral Distance:    Right Eye Near:   Left Eye Near:    Bilateral Near:     Physical Exam Vitals and nursing note reviewed.  Constitutional:      Appearance: Normal appearance. She is not ill-appearing.  HENT:     Head: Normocephalic and atraumatic.     Right Ear: Tympanic membrane, ear canal and external ear normal. There is no impacted cerumen.     Left Ear: Tympanic membrane, ear canal and external ear normal. There is no impacted cerumen.     Nose: Nose normal. No congestion or rhinorrhea.     Mouth/Throat:      Mouth: Mucous membranes are moist.     Pharynx: Oropharyngeal exudate and posterior oropharyngeal erythema present.     Comments: Hard palate is covered in white lacy patches with an erythematous base.  No white coating on the tongue noted.  Erythema and edema to the soft palate and tonsillar pillars. Cardiovascular:     Rate and Rhythm: Normal rate and regular rhythm.     Pulses: Normal pulses.     Heart sounds: Normal heart sounds. No murmur heard.    No friction rub. No gallop.  Pulmonary:     Effort: Pulmonary effort is normal.     Breath sounds: Normal breath sounds. No wheezing, rhonchi or rales.  Musculoskeletal:     Cervical back: Normal range of motion and neck supple. No tenderness.  Lymphadenopathy:     Cervical: No cervical adenopathy.  Skin:    General: Skin is warm and dry.     Capillary Refill: Capillary refill takes less than 2 seconds.     Findings: No rash.  Neurological:     General: No focal deficit present.     Mental Status: She is alert and oriented to person, place, and time.      UC Treatments / Results  Labs (all labs ordered are listed, but only abnormal results are displayed) Labs Reviewed  SARS CORONAVIRUS 2 BY RT PCR - Abnormal; Notable for the following components:      Result Value   SARS Coronavirus 2 by RT PCR POSITIVE (*)  All other components within normal limits  GROUP A STREP BY PCR    EKG   Radiology No results found.  Procedures Procedures (including critical care time)  Medications Ordered in UC Medications - No data to display  Initial Impression / Assessment and Plan / UC Course  I have reviewed the triage vital signs and the nursing notes.  Pertinent labs & imaging results that were available during my care of the patient were reviewed by me and considered in my medical decision making (see chart for details).   Patient is a very pleasant 48 year old female presenting for evaluation of upper respiratory symptoms that  began 4 days ago as outlined in the HPI above.  Patient reports she did develop a fever last night with a Tmax of 102.4.  This morning her temperature was slightly over 100.  She is currently 99.1 but does not have any antipyretics on board.  She does have a history of right-sided breast cancer with initial lumpectomy followed by a right mastectomy.  She reports that she is not on tamoxifen at present.  She also reports that she is not taking an any prednisone  recently.  However, she is prescribed Symbicort  inhaler.  On exam the patient does have white lacy patches on the roof of her mouth with an erythematous base.  She also has erythema and edema of her soft palate and tonsillar pillars.  The erythema on her hard palate is suspicious for possible thrush from her Symbicort .  However, I will also order a strep PCR.  She is denying any runny nose nasal congestion she does not have any inflamed nasal mucosa on exam.  I will also order a COVID PCR.  I will not test her for influenza at this time as she has had symptoms for 4 days and is outside the therapeutic window for antivirals.  Strep PCR is negative.  COVID PCR is positive.  I will discharge patient with a diagnosis of COVID-19.  I will prescribe Atrovent  nasal spray to help with the nasal congestion along with Tessalon  Perles and Promethazine  DM cough syrup for cough and congestion.  She may continue to use over-the-counter Tylenol  and/or ibuprofen  as needed for fever or pain.  Return precautions reviewed.  Additionally, I suspect that the [facility] patch on the roof of her mouth is thrush from her Symbicort  inhaler and I will prescribe nystatin  swish and spit 4 times daily x 7 days.   Final Clinical Impressions(s) / UC Diagnoses   Final diagnoses:  COVID-19  Oral thrush     Discharge Instructions      CDC guidelines state that you must wear a mask for the first 5 days of symptoms when you are around other people.  After 5 days you no longer  need to mask as you are no longer considered infectious.  There is no longer need to quarantine unless you have a fever.  If you do have a fever then you need to quarantine until you have been fever free for 24 hours without taking Tylenol  and/or ibuprofen .  Use over-the-counter Tylenol  and/or ibuprofen  according to the package instructions as needed for fever and pain.  Use the Atrovent  nasal spray, 2 squirts up each nostril every 6 hours, as needed for nasal congestion and runny nose.  Use the Tessalon  Perles every 8 hours during the day as needed for cough.  Take them with a small sip of water.  You may experience numbness to the base of your tongue or  metallic taste in her mouth, this is normal.  Use the Promethazine  DM cough syrup at bedtime as needed for cough and congestion.  Be mindful this medication will make you sleepy.  I suspect that the white patch on the roof of your mouth is thrush secondary to your Symbicort  inhaler.  Make sure that you are rinsing your mouth thoroughly, or brushing your teeth, following use of your Symbicort  inhaler to prevent further outbreaks of thrush.  Swish and spit 1 teaspoon of nystatin  4 times daily for the next week to help resolve the thrush.  If you develop any worsening respiratory symptoms such as shortness of breath, shortness of breath at rest, feel as though you cannot catch your breath, you are unable to speak in full sentences, or, as a late sign, your lips begin turning blue you need to call 911 and go to the ER for evaluation.      ED Prescriptions     Medication Sig Dispense Auth. Provider   benzonatate  (TESSALON ) 100 MG capsule Take 2 capsules (200 mg total) by mouth every 8 (eight) hours. 21 capsule Bernardino Ditch, NP   ipratropium (ATROVENT ) 0.06 % nasal spray Place 2 sprays into both nostrils 4 (four) times daily. 15 mL Bernardino Ditch, NP   promethazine -dextromethorphan (PROMETHAZINE -DM) 6.25-15 MG/5ML syrup Take 5 mLs by mouth 4 (four)  times daily as needed. 118 mL Bernardino Ditch, NP   nystatin  (MYCOSTATIN ) 100000 UNIT/ML suspension Take 5 mLs (500,000 Units total) by mouth 4 (four) times daily. 60 mL Bernardino Ditch, NP      PDMP not reviewed this encounter.   Bernardino Ditch, NP 03/29/24 1051

## 2024-04-10 ENCOUNTER — Encounter: Payer: Self-pay | Admitting: Family Medicine

## 2024-04-11 ENCOUNTER — Ambulatory Visit (INDEPENDENT_AMBULATORY_CARE_PROVIDER_SITE_OTHER): Admitting: Family Medicine

## 2024-04-11 VITALS — BP 124/72 | HR 91 | Resp 16 | Ht 63.0 in | Wt 155.0 lb

## 2024-04-11 DIAGNOSIS — E7801 Familial hypercholesterolemia: Secondary | ICD-10-CM | POA: Diagnosis not present

## 2024-04-11 DIAGNOSIS — Z716 Tobacco abuse counseling: Secondary | ICD-10-CM

## 2024-04-11 DIAGNOSIS — M25521 Pain in right elbow: Secondary | ICD-10-CM

## 2024-04-11 DIAGNOSIS — B370 Candidal stomatitis: Secondary | ICD-10-CM | POA: Diagnosis not present

## 2024-04-11 DIAGNOSIS — F17200 Nicotine dependence, unspecified, uncomplicated: Secondary | ICD-10-CM | POA: Diagnosis not present

## 2024-04-11 DIAGNOSIS — Z0000 Encounter for general adult medical examination without abnormal findings: Secondary | ICD-10-CM

## 2024-04-11 DIAGNOSIS — I10 Essential (primary) hypertension: Secondary | ICD-10-CM

## 2024-04-11 DIAGNOSIS — Z1159 Encounter for screening for other viral diseases: Secondary | ICD-10-CM

## 2024-04-11 MED ORDER — VARENICLINE TARTRATE (STARTER) 0.5 MG X 11 & 1 MG X 42 PO TBPK: ORAL_TABLET | ORAL | 0 refills | Status: DC

## 2024-04-11 MED ORDER — VARENICLINE TARTRATE 1 MG PO TABS: 1.0000 mg | ORAL_TABLET | Freq: Two times a day (BID) | ORAL | 4 refills | Status: DC

## 2024-04-11 MED ORDER — NYSTATIN 100000 UNIT/ML MT SUSP: 500000.0000 [IU] | Freq: Four times a day (QID) | OROMUCOSAL | 0 refills | Status: DC

## 2024-04-11 MED ORDER — HYDROCHLOROTHIAZIDE 12.5 MG PO TABS: ORAL_TABLET | ORAL | 2 refills | Status: DC

## 2024-04-11 NOTE — Progress Notes (Unsigned)
 Name: Elizabeth Torres   MRN: [id number]    DOB: February 17, 1976   Date:04/11/2024       Progress Note  Chief Complaint  Patient presents with   Establish Care     Subjective:   Elizabeth Torres is a 48 y.o. female, presents to clinic to establish care transferring care from other primary care office in [facility] health  Concern for thrush for several weeks, tx at [facility] at the same time  Right elbow pain for 5 months  Woke up one day and it started hurting Left handed female Resting tends to help the pain be a little better, working more and lifting heavy boxes flares up - very tender      Current Outpatient Medications:    aspirin  EC 81 MG tablet, Take 1 tablet (81 mg total) by mouth daily. Swallow whole., Disp: 90 tablet, Rfl: 3   atorvastatin  (LIPITOR) 20 MG tablet, TAKE ONE (1) TABLET BY MOUTH DAILY, Disp: 90 tablet, Rfl: 3   benzonatate  (TESSALON ) 100 MG capsule, Take 2 capsules (200 mg total) by mouth every 8 (eight) hours., Disp: 21 capsule, Rfl: 0   budesonide -formoterol  (SYMBICORT ) 160-4.5 MCG/ACT inhaler, Inhale 2 puffs into the lungs in the morning and at bedtime., Disp: 3 each, Rfl: 6   fluticasone (FLONASE) 50 MCG/ACT nasal spray, Place 2 sprays into both nostrils daily., Disp: , Rfl:    hydrochlorothiazide  (HYDRODIURIL ) 12.5 MG tablet, TAKE (1) TABLET BY MOUTH EVERY DAY, Disp: 90 tablet, Rfl: 0   HYDROcodone -acetaminophen  (NORCO) 10-325 MG tablet, Take 1 tablet by mouth 5 (five) times daily as needed., Disp: 150 tablet, Rfl: 0   ipratropium (ATROVENT ) 0.06 % nasal spray, Place 2 sprays into both nostrils 4 (four) times daily., Disp: 15 mL, Rfl: 12   methocarbamol  (ROBAXIN ) 500 MG tablet, TAKE (1) TABLET BY MOUTH TWICE DAILY AS NEEDED FOR MUSCLE SPASM, Disp: 60 tablet, Rfl: 1   nortriptyline  (PAMELOR ) 10 MG capsule, TAKE (3) CAPSULES AT BEDTIME AS DIRECTED, Disp: 270 capsule, Rfl: 1   nystatin  (MYCOSTATIN ) 100000 UNIT/ML suspension, Take 5 mLs (500,000 Units total) by mouth 4 (four)  times daily., Disp: 60 mL, Rfl: 0   promethazine -dextromethorphan (PROMETHAZINE -DM) 6.25-15 MG/5ML syrup, Take 5 mLs by mouth 4 (four) times daily as needed., Disp: 118 mL, Rfl: 0   Spacer/Aero-Holding Chambers (AEROCHAMBER MV) inhaler, Use as instructed, Disp: 1 each, Rfl: 0   spironolactone (ALDACTONE) 100 MG tablet, Take 100 mg by mouth daily., Disp: , Rfl:    zolpidem  (AMBIEN ) 10 MG tablet, Take 1 tablet (10 mg total) by mouth at bedtime as needed for sleep., Disp: 30 tablet, Rfl: 2  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   Right arm weakness 07/08/2022   Cervical radiculopathy 07/08/2022   S/P cervical spinal fusion 07/08/2022   Cervical radicular pain 07/24/2021   Spinal stenosis in cervical region 07/24/2021   Chronic pain syndrome 07/24/2021   Cervical spondylosis with radiculopathy 02/25/2021   Mass of soft tissue of upper arm 01/31/2021   Elevated blood-pressure reading, without diagnosis of hypertension 08/26/2020   Menorrhagia with irregular cycle 04/02/2020   Rotator cuff impingement syndrome, left 12/15/2018   Cervical myofascial pain syndrome 02/14/2016   History of breast cancer 11/07/2015   Postlaminectomy syndrome, lumbar region 04/12/2012   Chemotherapy-induced neuropathy (HCC) 04/12/2012   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 (HCC) 08/07/2011    Past Surgical History:  Procedure  Laterality Date   ANTERIOR CERVICAL DECOMP/DISCECTOMY FUSION N/A 07/08/2022   Procedure: C4-7 ANTERIOR CERVICAL DISCECTOMY AND FUSION (GLOBUS HEDRON);  Surgeon: Clois Fret, MD;  Location: [facility] ORS;  Service: Neurosurgery;  Laterality: N/A;   BREAST BIOPSY Right 07/2011   invasive ductal carcinoma   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 RAd and chemo tx   BREAST SURGERY     mastectomy Right    partial with lymph node dissection   PORT A CATH  REVISION     PORT-A-CATH REMOVAL  11/07/2015   Dr Dessa   RHINOPLASTY Bilateral 01/09/2024   SHOULDER ARTHROSCOPY WITH ROTATOR CUFF REPAIR Left 05/18/2019   Procedure: SHOULDER ARTHROSCOPY WITH SUBACROMIAL DECOMPRESSION AND DISTAL CLAVICAL EXCISION, INTRAARTICULAR DEBRIDEMENT;  Surgeon: Leora Lynwood SAUNDERS, MD;  Location: [facility];  Service: Orthopedics;  Laterality: Left;   SPINE SURGERY  2008   Dr Leeann   TUBAL LIGATION      Family History  Problem Relation Age of Onset   Diabetes Mother    Hypertension Mother    Pulmonary fibrosis Mother    Heart disease Father    Heart attack Father    Other Father        unknown medical history   Peripheral Artery Disease Father    Breast cancer Paternal Aunt        22'S   Cancer Paternal Uncle    Breast cancer Paternal Uncle        43'S    Social History   Tobacco Use   Smoking status: Every Day    Current packs/day: 1.00    Average packs/day: 1 pack/day for 31.7 years (31.7 ttl pk-yrs)    Types: Cigarettes    Start date: 1994   Smokeless tobacco: Never  Vaping Use   Vaping status: Never Used  Substance Use Topics   Alcohol  use: Not Currently   Drug use: Never     Allergies  Allergen Reactions   Penicillins Anaphylaxis and Other (See Comments)   Pregabalin  Other (See Comments)    Thoughts of self harm   Sulfa Antibiotics Anaphylaxis and Other (See Comments)   Ondansetron  Other (See Comments)    migraines   Other    Zofran  [Ondansetron  Hcl]     migraines    Health Maintenance  Topic Date Due   HIV Screening  Never done   Hepatitis C Screening  Never done   Colonoscopy  Never done   Influenza Vaccine  Never done   Hepatitis B Vaccines 19-59 Average Risk (1 of 3 - 19+ 3-dose series) 04/10/2025 (Originally 04/28/1996)   Cervical Cancer Screening (HPV/Pap Cotest)  09/03/2024   Pneumococcal Vaccine (3 of 3 - PCV20 or PCV21) 04/29/2027   DTaP/Tdap/Td (2 - Td or Tdap) 10/28/2027   HPV VACCINES  Aged Out    Meningococcal B Vaccine  Aged Out   COVID-19 Vaccine  Discontinued    Chart Review Today: I personally reviewed active problem list, medication list, allergies, family history, social history, health maintenance, notes from last encounter, lab results, imaging with the patient/caregiver today.   Review of Systems  Constitutional: Negative.   HENT: Negative.    Eyes: Negative.   Respiratory: Negative.    Cardiovascular: Negative.   Gastrointestinal: Negative.   Endocrine: Negative.   Genitourinary: Negative.   Musculoskeletal: Negative.   Skin: Negative.   Allergic/Immunologic: Negative.   Neurological: Negative.   Hematological: Negative.   Psychiatric/Behavioral: Negative.    All  other systems reviewed and are negative.    Objective:   Vitals:   04/11/24 0909  BP: 124/72  Pulse: 91  Resp: 16  SpO2: 96%  Weight: 155 lb (70.3 kg)  Height: 5' 3 (1.6 m)    Body mass index is 27.46 kg/m.  Physical Exam Vitals and nursing note reviewed.  Constitutional:      General: She is not in acute distress.    Appearance: Normal appearance. She is well-developed. She is not ill-appearing, toxic-appearing or diaphoretic.  HENT:     Head: Normocephalic and atraumatic.     Right Ear: External ear normal.     Left Ear: External ear normal.     Nose: Nose normal.     Mouth/Throat:     Comments: Matted white tongue Erythematous mucosa throughout Prior soft palate white and erythematous patches are now just erythema and mild edema/injection Eyes:     General: No scleral icterus.       Right eye: No discharge.        Left eye: No discharge.     Conjunctiva/sclera: Conjunctivae normal.  Neck:     Trachea: No tracheal deviation.  Cardiovascular:     Rate and Rhythm: Normal rate.  Pulmonary:     Effort: Pulmonary effort is normal. No respiratory distress.     Breath sounds: No stridor.  Skin:    General: Skin is warm and dry.     Findings: No rash.  Neurological:      Mental Status: She is alert.     Motor: No abnormal muscle tone.     Coordination: Coordination normal.     Gait: Gait normal.  Psychiatric:        Mood and Affect: Mood normal.        Behavior: Behavior normal.      Functional Status Survey: Is the patient deaf or have difficulty hearing?: No Does the patient have difficulty seeing, even when wearing glasses/contacts?: No Does the patient have difficulty concentrating, remembering, or making decisions?: No Does the patient have difficulty walking or climbing stairs?: No Does the patient have difficulty dressing or bathing?: No Does the patient have difficulty doing errands alone such as visiting a doctor's office or shopping?: No Results for orders placed or performed during the hospital encounter of 03/29/24  SARS Coronavirus 2 by RT PCR (hospital order, performed in [facility] hospital lab) *cepheid single result test* Anterior Nasal Swab   Collection Time: 03/29/24  9:33 AM   Specimen: Anterior Nasal Swab  Result Value Ref Range   SARS Coronavirus 2 by RT PCR POSITIVE (A) NEGATIVE  Group A Strep by PCR   Collection Time: 03/29/24  9:35 AM   Specimen: Anterior Nasal Swab; Sterile Swab  Result Value Ref Range   Group A Strep by PCR NOT DETECTED NOT DETECTED      Assessment & Plan:     ICD-10-CM   1. Encounter for medical examination to establish care  Z00.00    transferring care to our office, [facility] - reviewed chart hx updated    2. Familial hypercholesterolemia  E78.01    will check labs with CPE, not currently on meds    3. Essential hypertension  I10 hydrochlorothiazide  (HYDRODIURIL ) 12.5 MG tablet   BP at goal today on current med, refills ordered    4. Thrush  B37.0 Ambulatory referral to Orthopedic Surgery    nystatin  (MYCOSTATIN ) 100000 UNIT/ML suspension    fluconazole  (DIFLUCAN ) 150  MG tablet   severe with recent ED visit, use of ICS/inhalers and recent Abx use, ramp up  treatment and rinse mouth after inhaler use, f/up if not improving    5. Right elbow pain  M25.521 Ambulatory referral to Orthopedic Surgery   very tender worse with more activity, some improvement with rest, ortho consult, consider FMLA    6. Encounter for smoking cessation counseling  Z71.6 Varenicline  Tartrate, Starter, (CHANTIX  STARTING MONTH PAK) 0.5 MG X 11 & 1 MG X 42 TBPK    varenicline  (CHANTIX  CONTINUING MONTH PAK) 1 MG tablet    7. Current smoker  F17.200 Varenicline  Tartrate, Starter, (CHANTIX  STARTING MONTH PAK) 0.5 MG X 11 & 1 MG X 42 TBPK    varenicline  (CHANTIX  CONTINUING MONTH PAK) 1 MG tablet   restarted smoking and would like to quit again, did well previously on chantix         Return in about 6 months (around 10/09/2024) for routine follow up or she can do CPE here .   Michelene Cower, PA-C 04/11/24 9:33 AM

## 2024-04-12 ENCOUNTER — Encounter: Payer: Self-pay | Admitting: Registered Nurse

## 2024-04-12 ENCOUNTER — Encounter: Attending: Physical Medicine and Rehabilitation | Admitting: Registered Nurse

## 2024-04-12 VITALS — Ht 63.0 in | Wt 155.0 lb

## 2024-04-12 DIAGNOSIS — G894 Chronic pain syndrome: Secondary | ICD-10-CM | POA: Diagnosis not present

## 2024-04-12 DIAGNOSIS — Z5181 Encounter for therapeutic drug level monitoring: Secondary | ICD-10-CM | POA: Insufficient documentation

## 2024-04-12 DIAGNOSIS — T451X5A Adverse effect of antineoplastic and immunosuppressive drugs, initial encounter: Secondary | ICD-10-CM | POA: Insufficient documentation

## 2024-04-12 DIAGNOSIS — G620 Drug-induced polyneuropathy: Secondary | ICD-10-CM | POA: Diagnosis not present

## 2024-04-12 DIAGNOSIS — M5416 Radiculopathy, lumbar region: Secondary | ICD-10-CM | POA: Insufficient documentation

## 2024-04-12 DIAGNOSIS — Z79891 Long term (current) use of opiate analgesic: Secondary | ICD-10-CM | POA: Insufficient documentation

## 2024-04-12 DIAGNOSIS — M961 Postlaminectomy syndrome, not elsewhere classified: Secondary | ICD-10-CM | POA: Insufficient documentation

## 2024-04-12 MED ORDER — HYDROCODONE-ACETAMINOPHEN 10-325 MG PO TABS: 1.0000 | ORAL_TABLET | Freq: Every day | ORAL | 0 refills | Status: DC | PRN

## 2024-04-12 NOTE — Progress Notes (Signed)
 Subjective:    Patient ID: Elizabeth Torres, female    DOB: 24-Oct-1975, 48 y.o.   MRN: [id number]  HPI: Elizabeth Torres is a 48 y.o. female whose appointment was changed to Virtual visit due to + COVID results. I connected with [name] by a video enabled telemedicine application and verified that I am speaking with the correct person using two identifiers.  Location: Patient: In her Home Provider: In the Office    I discussed the limitations of evaluation and management by telemedicine and the availability of in person appointments. The patient expressed understanding and agreed to proceed.  She  states her pain is located in her lower back radiating into her bilateral lower extremities .She rates her pain 3. Her current exercise regime is walking and performing stretching exercises.  Ms. Jobson Morphine  equivalent is 50.00 MME.   Last UDS was Performed on 01/18/2025, it was consistent.      Pain Inventory Average Pain 4 Pain Right Now 3 My pain is constant and aching  In the last 24 hours, has pain interfered with the following? General activity 3 Relation with others 3 Enjoyment of life 3 What TIME of day is your pain at its worst? evening Sleep (in general) Fair  Pain is worse with: standing Pain improves with: rest and medication Relief from Meds: 6  Family History  Problem Relation Age of Onset   Diabetes Mother    Hypertension Mother    Pulmonary fibrosis Mother    Heart disease Father    Heart attack Father    Other Father        unknown medical history   Peripheral Artery Disease Father    Asthma Brother    Breast cancer Paternal Aunt        58'S   Cancer Paternal Uncle    Breast cancer Paternal Uncle        7'S   Social History   Socioeconomic History   Marital status: Married    Spouse name: Elizabeth Torres   Number of children: 3   Years of education: Not on file   Highest education level: Associate degree: occupational, Scientist, product/process development, or vocational  program  Occupational History   Not on file  Tobacco Use   Smoking status: Every Day    Current packs/day: 1.00    Average packs/day: 1 pack/day for 31.7 years (31.7 ttl pk-yrs)    Types: Cigarettes    Start date: 1994   Smokeless tobacco: Never  Vaping Use   Vaping status: Never Used  Substance and Sexual Activity   Alcohol  use: Not Currently   Drug use: Never   Sexual activity: Yes    Partners: Male    Comment: ablasion  Other Topics Concern   Not on file  Social History Narrative   Not on file   Social Drivers of Health   Financial Resource Strain: Low Risk  (04/11/2024)   Overall Financial Resource Strain (CARDIA)    Difficulty of Paying Living Expenses: Not hard at all  Food Insecurity: No Food Insecurity (04/11/2024)   Hunger Vital Sign    Worried About Running Out of Food in the Last Year: Never true    Ran Out of Food in the Last Year: Never true  Transportation Needs: No Transportation Needs (04/11/2024)   PRAPARE - Administrator, Civil Service (Medical): No    Lack of Transportation (Non-Medical): No  Physical Activity: Sufficiently Active (04/11/2024)   Exercise Vital  Sign    Days of Exercise per Week: 5 days    Minutes of Exercise per Session: 30 min  Stress: Stress Concern Present (04/11/2024)   [employer] of Occupational Health - Occupational Stress Questionnaire    Feeling of Stress: Very much  Social Connections: Moderately Isolated (04/11/2024)   Social Connection and Isolation Panel    Frequency of Communication with Friends and Family: More than three times a week    Frequency of Social Gatherings with Friends and Family: More than three times a week    Attends Religious Services: Never    Database administrator or Organizations: No    Attends Engineer, structural: Not on file    Marital Status: Married   Past Surgical History:  Procedure Laterality Date   ANTERIOR CERVICAL DECOMP/DISCECTOMY FUSION N/A 07/08/2022   Procedure:  C4-7 ANTERIOR CERVICAL DISCECTOMY AND FUSION (GLOBUS HEDRON);  Surgeon: Clois Fret, MD;  Location: [facility];  Service: Neurosurgery;  Laterality: N/A;   BREAST BIOPSY Right 07/2011   invasive ductal carcinoma   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 RAd and chemo tx   BREAST SURGERY     mastectomy Right    partial with lymph node dissection   PORT A CATH REVISION     PORT-A-CATH REMOVAL  11/07/2015   Dr Dessa   RHINOPLASTY Bilateral 01/09/2024   SHOULDER ARTHROSCOPY WITH ROTATOR CUFF REPAIR Left 05/18/2019   Procedure: SHOULDER ARTHROSCOPY WITH SUBACROMIAL DECOMPRESSION AND DISTAL CLAVICAL EXCISION, INTRAARTICULAR DEBRIDEMENT;  Surgeon: Leora Lynwood SAUNDERS, MD;  Location: [facility];  Service: Orthopedics;  Laterality: Left;   SPINE SURGERY  2008   Dr Leeann   TUBAL LIGATION     Past Surgical History:  Procedure Laterality Date   ANTERIOR CERVICAL DECOMP/DISCECTOMY FUSION N/A 07/08/2022   Procedure: C4-7 ANTERIOR CERVICAL DISCECTOMY AND FUSION (GLOBUS HEDRON);  Surgeon: Clois Fret, MD;  Location: [facility];  Service: Neurosurgery;  Laterality: N/A;   BREAST BIOPSY Right 07/2011   invasive ductal carcinoma   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 RAd and chemo tx   BREAST SURGERY     mastectomy Right    partial with lymph node dissection   PORT A CATH REVISION     PORT-A-CATH REMOVAL  11/07/2015   Dr Dessa   RHINOPLASTY Bilateral 01/09/2024   SHOULDER ARTHROSCOPY WITH ROTATOR CUFF REPAIR Left 05/18/2019   Procedure: SHOULDER ARTHROSCOPY WITH SUBACROMIAL DECOMPRESSION AND DISTAL CLAVICAL EXCISION, INTRAARTICULAR DEBRIDEMENT;  Surgeon: Leora Lynwood SAUNDERS, MD;  Location: 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  Opioid Risk Score:   Fall Risk Score:  `1  Depression screen [facility] 2/9     04/11/2024    8:48  AM 03/14/2024    9:17 AM 02/16/2024    8:43 AM 12/22/2023    2:14 PM 10/20/2023    8:38 AM 09/17/2023    9:49 AM 08/06/2023    9:32 AM  Depression screen PHQ 2/9  Decreased Interest 0 0 0 0 0 1 0  Down, Depressed, Hopeless 0 0 0 0 0 1 0  PHQ - 2 Score 0 0 0 0 0 2 0  Altered sleeping   0 0  3 0  Tired, decreased energy   0 0  3 0  Change in appetite   0 0  0 0  Feeling bad or failure about yourself    0 0  0 0  Trouble concentrating   0 0  3 0  Moving slowly or fidgety/restless   0 0  0 0  Suicidal thoughts   0 0  0 0  PHQ-9 Score   0 0  11 0  Difficult doing work/chores    Not difficult at all  Somewhat difficult Not difficult at all    Review of Systems  Musculoskeletal:  Positive for back pain and neck pain.       Pain in both legs down to feet  All other systems reviewed and are negative.      Objective:   Physical Exam Vitals and nursing note reviewed.  Musculoskeletal:     Comments: No Physical Exam Performed: Virtual Visit          Assessment & Plan:  1. Lumbar postlaminectomy syndrome status post L5-S1 fusion with chronic S1 radiculopathy. Continue current medication regimen with Nortriptyline . 04/12/2024. Refilled :  Hydrocodone  10/325 mg one tablet 5 times a day as needed for pain #150 04/12/2024. We will continue the opioid monitoring program, this consists of regular clinic visits, examinations, urine drug screen, pill counts as well as use of [place]  Controlled  Substance Reporting system. A 12 month History has been reviewed on the [place]  Controlled Substance Reporting System on 04/12/2024. 2. 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 04/12/2024 3. Insomnia: Continue current medication regimen Ambien . 04/12/2024  4.Chronic Left Shoulder Pain: S/P Left Shoulder Arthroscopy with Rotator Cuff Repair on 05/18/2019 by Dr. Gregg : Ortho Following. 04/12/2024 5. Cervicalgia/ Cervical Radiculitis: Continue Pamelor .S/P on 07/08/2022. with Dr Clois:  C4-7 ANTERIOR CERVICAL DISCECTOMY AND FUSION (GLOBUS HEDRON) N/A General   We will continue to monitor.  04/12/2024   F/U in 1 month.  Virtual Visit Established Patient Location of Patient: In her Home Location of Provider in the Office

## 2024-04-13 ENCOUNTER — Encounter: Payer: Self-pay | Admitting: Family Medicine

## 2024-04-13 MED ORDER — FLUCONAZOLE 150 MG PO TABS: 150.0000 mg | ORAL_TABLET | ORAL | 0 refills | Status: DC | PRN

## 2024-04-13 MED ORDER — FLUCONAZOLE 150 MG PO TABS: ORAL_TABLET | ORAL | 0 refills | Status: DC

## 2024-05-09 ENCOUNTER — Encounter: Attending: Physical Medicine and Rehabilitation | Admitting: Registered Nurse

## 2024-05-09 ENCOUNTER — Encounter: Payer: Self-pay | Admitting: Registered Nurse

## 2024-05-09 VITALS — BP 113/75 | HR 100 | Ht 63.0 in | Wt 157.0 lb

## 2024-05-09 DIAGNOSIS — Z5181 Encounter for therapeutic drug level monitoring: Secondary | ICD-10-CM | POA: Insufficient documentation

## 2024-05-09 DIAGNOSIS — G620 Drug-induced polyneuropathy: Secondary | ICD-10-CM | POA: Insufficient documentation

## 2024-05-09 DIAGNOSIS — G894 Chronic pain syndrome: Secondary | ICD-10-CM | POA: Diagnosis not present

## 2024-05-09 DIAGNOSIS — Z79891 Long term (current) use of opiate analgesic: Secondary | ICD-10-CM | POA: Insufficient documentation

## 2024-05-09 DIAGNOSIS — M961 Postlaminectomy syndrome, not elsewhere classified: Secondary | ICD-10-CM | POA: Diagnosis not present

## 2024-05-09 DIAGNOSIS — T451X5A Adverse effect of antineoplastic and immunosuppressive drugs, initial encounter: Secondary | ICD-10-CM | POA: Diagnosis not present

## 2024-05-09 DIAGNOSIS — M5416 Radiculopathy, lumbar region: Secondary | ICD-10-CM | POA: Insufficient documentation

## 2024-05-09 MED ORDER — HYDROCODONE-ACETAMINOPHEN 10-325 MG PO TABS: 1.0000 | ORAL_TABLET | Freq: Every day | ORAL | 0 refills | Status: DC | PRN

## 2024-05-09 NOTE — Progress Notes (Signed)
 Subjective:    Patient ID: Elizabeth Torres, female    DOB: 1976/02/25, 48 y.o.   MRN: [id number]  HPI: Elizabeth Torres is a 48 y.o. female who returns for follow up appointment for chronic pain and medication refill. She states her pain is located in her neck and lower back pain radiating into her bilateral lower extremities. She rates her pain 4. Her current exercise regime is walking and performing stretching exercises.  [name] Morphine  equivalent is 50.00 MME.   Last UDS was Performed on 01/09/2024, it was consistent.    Pain Inventory Average Pain 4 Pain Right Now 4 My pain is constant and aching  In the last 24 hours, has pain interfered with the following? General activity 3 Relation with others 3 Enjoyment of life 3 What TIME of day is your pain at its worst? evening Sleep (in general) Fair  Pain is worse with: inactivity and some activites Pain improves with: heat/ice and medication Relief from Meds: 5  Family History  Problem Relation Age of Onset   Diabetes Mother    Hypertension Mother    Pulmonary fibrosis Mother    Heart disease Father    Heart attack Father    Other Father        unknown medical history   Peripheral Artery Disease Father    Asthma Brother    Breast cancer Paternal Aunt        30'S   Cancer Paternal Uncle    Breast cancer Paternal Uncle        65'S   Social History   Socioeconomic History   Marital status: Married    Spouse name: Kristien Salatino   Number of children: 3   Years of education: Not on file   Highest education level: Associate degree: occupational, Scientist, product/process development, or vocational program  Occupational History   Not on file  Tobacco Use   Smoking status: Every Day    Current packs/day: 1.00    Average packs/day: 1 pack/day for 31.8 years (31.8 ttl pk-yrs)    Types: Cigarettes    Start date: 1994   Smokeless tobacco: Never  Vaping Use   Vaping status: Never Used  Substance and Sexual Activity   Alcohol  use: Not Currently   Drug  use: Never   Sexual activity: Yes    Partners: Male    Comment: ablasion  Other Topics Concern   Not on file  Social History Narrative   Not on file   Social Drivers of Health   Financial Resource Strain: Low Risk  (04/11/2024)   Overall Financial Resource Strain (CARDIA)    Difficulty of Paying Living Expenses: Not hard at all  Food Insecurity: No Food Insecurity (04/11/2024)   Hunger Vital Sign    Worried About Running Out of Food in the Last Year: Never true    Ran Out of Food in the Last Year: Never true  Transportation Needs: No Transportation Needs (04/11/2024)   PRAPARE - Administrator, Civil Service (Medical): No    Lack of Transportation (Non-Medical): No  Physical Activity: Sufficiently Active (04/11/2024)   Exercise Vital Sign    Days of Exercise per Week: 5 days    Minutes of Exercise per Session: 30 min  Stress: Stress Concern Present (04/11/2024)   [employer] of Occupational Health - Occupational Stress Questionnaire    Feeling of Stress: Very much  Social Connections: Moderately Isolated (04/11/2024)   Social Connection and Isolation Panel  Frequency of Communication with Friends and Family: More than three times a week    Frequency of Social Gatherings with Friends and Family: More than three times a week    Attends Religious Services: Never    Database administrator or Organizations: No    Attends Engineer, structural: Not on file    Marital Status: Married   Past Surgical History:  Procedure Laterality Date   ANTERIOR CERVICAL DECOMP/DISCECTOMY FUSION N/A 07/08/2022   Procedure: C4-7 ANTERIOR CERVICAL DISCECTOMY AND FUSION (GLOBUS HEDRON);  Surgeon: Clois Fret, MD;  Location: [facility];  Service: Neurosurgery;  Laterality: N/A;   BREAST BIOPSY Right 07/2011   invasive ductal carcinoma   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 RAd and chemo tx   BREAST SURGERY     mastectomy Right     partial with lymph node dissection   PORT A CATH REVISION     PORT-A-CATH REMOVAL  11/07/2015   Dr Dessa   RHINOPLASTY Bilateral 01/09/2024   SHOULDER ARTHROSCOPY WITH ROTATOR CUFF REPAIR Left 05/18/2019   Procedure: SHOULDER ARTHROSCOPY WITH SUBACROMIAL DECOMPRESSION AND DISTAL CLAVICAL EXCISION, INTRAARTICULAR DEBRIDEMENT;  Surgeon: Leora Lynwood SAUNDERS, MD;  Location: [facility];  Service: Orthopedics;  Laterality: Left;   SPINE SURGERY  2008   Dr Leeann   TUBAL LIGATION     Past Surgical History:  Procedure Laterality Date   ANTERIOR CERVICAL DECOMP/DISCECTOMY FUSION N/A 07/08/2022   Procedure: C4-7 ANTERIOR CERVICAL DISCECTOMY AND FUSION (GLOBUS HEDRON);  Surgeon: Clois Fret, MD;  Location: [facility];  Service: Neurosurgery;  Laterality: N/A;   BREAST BIOPSY Right 07/2011   invasive ductal carcinoma   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 RAd and chemo tx   BREAST SURGERY     mastectomy Right    partial with lymph node dissection   PORT A CATH REVISION     PORT-A-CATH REMOVAL  11/07/2015   Dr Dessa   RHINOPLASTY Bilateral 01/09/2024   SHOULDER ARTHROSCOPY WITH ROTATOR CUFF REPAIR Left 05/18/2019   Procedure: SHOULDER ARTHROSCOPY WITH SUBACROMIAL DECOMPRESSION AND DISTAL CLAVICAL EXCISION, INTRAARTICULAR DEBRIDEMENT;  Surgeon: Leora Lynwood SAUNDERS, MD;  Location: [facility];  Service: Orthopedics;  Laterality: Left;   SPINE SURGERY  2008   Dr Leeann   TUBAL LIGATION     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   Personal history of chemotherapy 2013   for breast ca   Personal history of radiation therapy 2013   F/U right breast cancer    Pneumonia    Radiation 2013   BREAST CA   Status post chemotherapy 2013   BREAST CA   BP 113/75  (BP Location: Left Arm, Patient Position: Sitting, Cuff Size: Normal)   Pulse (!) 102   Ht 5' 3 (1.6 m)   Wt 157 lb (71.2 kg)   SpO2 97%   BMI 27.81 kg/m   Opioid Risk Score:   Fall Risk Score:  `1  Depression screen [facility] 2/9     05/09/2024   10:03 AM 04/11/2024    8:48 AM 03/14/2024    9:17 AM 02/16/2024    8:43 AM 12/22/2023    2:14 PM 10/20/2023    8:38  AM 09/17/2023    9:49 AM  Depression screen PHQ 2/9  Decreased Interest 0 0 0 0 0 0 1  Down, Depressed, Hopeless 0 0 0 0 0 0 1  PHQ - 2 Score 0 0 0 0 0 0 2  Altered sleeping 0   0 0  3  Tired, decreased energy 0   0 0  3  Change in appetite 0   0 0  0  Feeling bad or failure about yourself  0   0 0  0  Trouble concentrating 0   0 0  3  Moving slowly or fidgety/restless 0   0 0  0  Suicidal thoughts 0   0 0  0  PHQ-9 Score 0   0 0  11  Difficult doing work/chores Somewhat difficult    Not difficult at all  Somewhat difficult      Review of Systems  Musculoskeletal:  Positive for back pain and myalgias.       Low back pain radiating down bilateral legs  All other systems reviewed and are negative.      Objective:   Physical Exam Vitals and nursing note reviewed.  Constitutional:      Appearance: Normal appearance.  Neck:     Comments: Decreased Cervical Flexion, Extension and Right and Left Rotation Cardiovascular:     Rate and Rhythm: Normal rate and regular rhythm.     Pulses: Normal pulses.     Heart sounds: Normal heart sounds.  Pulmonary:     Effort: Pulmonary effort is normal.     Breath sounds: Normal breath sounds.  Musculoskeletal:     Comments: Normal Muscle Bulk and Muscle Testing Reveals:  Upper Extremities: Decreased ROM 90 Degrees  and Muscle Strength 5/5 Lower Extremities: Full ROM and Muscle Strength 5/5 Arises from chair with ease Narrow Based Gait     Skin:    General: Skin is warm and dry.  Neurological:     Mental Status: She is alert and oriented to person, place, and time.   Psychiatric:        Mood and Affect: Mood normal.        Behavior: Behavior normal.          Assessment & Plan:  1. Lumbar postlaminectomy syndrome status post L5-S1 fusion with chronic S1 radiculopathy. Continue current medication regimen with Nortriptyline . 05/09/2024. Refilled :  Hydrocodone  10/325 mg one tablet 5 times a day as needed for pain #150 05/09/2024. We will continue the opioid monitoring program, this consists of regular clinic visits, examinations, urine drug screen, pill counts as well as use of [place]  Controlled Substance Reporting system. A 12 month History has been reviewed on the [place]  Controlled Substance Reporting System on 05/09/2024. 2. 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 05/09/2024 3. Insomnia: Continue current medication regimen Ambien . 05/09/2024  4.Chronic Left Shoulder Pain: S/P Left Shoulder Arthroscopy with Rotator Cuff Repair on 05/18/2019 by Dr. Gregg : Ortho Following. 05/09/2024 5. Cervicalgia/ Cervical Radiculitis: Continue Pamelor .S/P on 07/08/2022. with Dr Clois:  C4-7 ANTERIOR CERVICAL DISCECTOMY AND FUSION (GLOBUS HEDRON) N/A General   We will continue to monitor.  05/09/2024   F/U in 1 month.

## 2024-05-09 NOTE — Progress Notes (Signed)
drug

## 2024-05-11 DIAGNOSIS — J310 Chronic rhinitis: Secondary | ICD-10-CM | POA: Diagnosis not present

## 2024-06-02 ENCOUNTER — Encounter: Payer: Self-pay | Admitting: Family Medicine

## 2024-06-07 ENCOUNTER — Encounter: Attending: Physical Medicine and Rehabilitation | Admitting: Registered Nurse

## 2024-06-07 ENCOUNTER — Encounter: Payer: Self-pay | Admitting: Registered Nurse

## 2024-06-07 ENCOUNTER — Encounter: Admitting: Registered Nurse

## 2024-06-07 VITALS — BP 111/75 | HR 93 | Ht 63.0 in | Wt 158.0 lb

## 2024-06-07 DIAGNOSIS — T451X5A Adverse effect of antineoplastic and immunosuppressive drugs, initial encounter: Secondary | ICD-10-CM | POA: Insufficient documentation

## 2024-06-07 DIAGNOSIS — G620 Drug-induced polyneuropathy: Secondary | ICD-10-CM | POA: Diagnosis not present

## 2024-06-07 DIAGNOSIS — M5416 Radiculopathy, lumbar region: Secondary | ICD-10-CM | POA: Diagnosis not present

## 2024-06-07 DIAGNOSIS — Z5181 Encounter for therapeutic drug level monitoring: Secondary | ICD-10-CM | POA: Insufficient documentation

## 2024-06-07 DIAGNOSIS — M961 Postlaminectomy syndrome, not elsewhere classified: Secondary | ICD-10-CM | POA: Diagnosis not present

## 2024-06-07 DIAGNOSIS — Z79891 Long term (current) use of opiate analgesic: Secondary | ICD-10-CM | POA: Insufficient documentation

## 2024-06-07 DIAGNOSIS — G894 Chronic pain syndrome: Secondary | ICD-10-CM | POA: Diagnosis not present

## 2024-06-07 MED ORDER — HYDROCODONE-ACETAMINOPHEN 10-325 MG PO TABS: 1.0000 | ORAL_TABLET | Freq: Every day | ORAL | 0 refills | Status: DC | PRN

## 2024-06-07 NOTE — Progress Notes (Signed)
 Subjective:    Patient ID: Elizabeth Torres, female    DOB: 05-10-1976, 48 y.o.   MRN: [id number]  HPI: Elizabeth Torres is a 48 y.o. female who returns for follow up appointment for chronic pain and medication refill. She states her pain is located in her lower back radiating into her bilateral lower extremities. She rates her pain 3. Her current exercise regime is walking and performing stretching exercises.  Elizabeth Torres Morphine  equivalent is 50.00 MME.   Last UDS was Performed on 01/19/2024, it was consistent.    Pain Inventory Average Pain 3 Pain Right Now 3 My pain is constant and aching  In the last 24 hours, has pain interfered with the following? General activity 3 Relation with others 3 Enjoyment of life 3 What TIME of day is your pain at its worst? evening Sleep (in general) Fair  Pain is worse with: bending and standing Pain improves with: rest and medication Relief from Meds: 6  Family History  Problem Relation Age of Onset   Diabetes Mother    Hypertension Mother    Pulmonary fibrosis Mother    Heart disease Father    Heart attack Father    Other Father        unknown medical history   Peripheral Artery Disease Father    Asthma Brother    Breast cancer Paternal Aunt        38'S   Cancer Paternal Uncle    Breast cancer Paternal Uncle        61'S   Social History   Socioeconomic History   Marital status: Married    Spouse name: Lulamae Skorupski   Number of children: 3   Years of education: Not on file   Highest education level: Associate degree: occupational, scientist, product/process development, or vocational program  Occupational History   Not on file  Tobacco Use   Smoking status: Every Day    Current packs/day: 1.00    Average packs/day: 1 pack/day for 31.8 years (31.8 ttl pk-yrs)    Types: Cigarettes    Start date: 1994   Smokeless tobacco: Never  Vaping Use   Vaping status: Never Used  Substance and Sexual Activity   Alcohol  use: Not Currently   Drug use: Never   Sexual  activity: Yes    Partners: Male    Comment: ablasion  Other Topics Concern   Not on file  Social History Narrative   Not on file   Social Drivers of Health   Financial Resource Strain: Low Risk  (04/11/2024)   Overall Financial Resource Strain (CARDIA)    Difficulty of Paying Living Expenses: Not hard at all  Food Insecurity: No Food Insecurity (04/11/2024)   Hunger Vital Sign    Worried About Running Out of Food in the Last Year: Never true    Ran Out of Food in the Last Year: Never true  Transportation Needs: No Transportation Needs (04/11/2024)   PRAPARE - Administrator, Civil Service (Medical): No    Lack of Transportation (Non-Medical): No  Physical Activity: Sufficiently Active (04/11/2024)   Exercise Vital Sign    Days of Exercise per Week: 5 days    Minutes of Exercise per Session: 30 min  Stress: Stress Concern Present (04/11/2024)   [employer] of Occupational Health - Occupational Stress Questionnaire    Feeling of Stress: Very much  Social Connections: Moderately Isolated (04/11/2024)   Social Connection and Isolation Panel    Frequency of Communication with  Friends and Family: More than three times a week    Frequency of Social Gatherings with Friends and Family: More than three times a week    Attends Religious Services: Never    Database Administrator or Organizations: No    Attends Engineer, Structural: Not on file    Marital Status: Married   Past Surgical History:  Procedure Laterality Date   ANTERIOR CERVICAL DECOMP/DISCECTOMY FUSION N/A 07/08/2022   Procedure: C4-7 ANTERIOR CERVICAL DISCECTOMY AND FUSION (GLOBUS HEDRON);  Surgeon: Clois Fret, MD;  Location: [facility];  Service: Neurosurgery;  Laterality: N/A;   BREAST BIOPSY Right 07/2011   invasive ductal carcinoma   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 RAd and chemo tx   BREAST SURGERY     mastectomy Right    partial with lymph  node dissection   PORT A CATH REVISION     PORT-A-CATH REMOVAL  11/07/2015   Dr Dessa   RHINOPLASTY Bilateral 01/09/2024   SHOULDER ARTHROSCOPY WITH ROTATOR CUFF REPAIR Left 05/18/2019   Procedure: SHOULDER ARTHROSCOPY WITH SUBACROMIAL DECOMPRESSION AND DISTAL CLAVICAL EXCISION, INTRAARTICULAR DEBRIDEMENT;  Surgeon: Leora Lynwood SAUNDERS, MD;  Location: [facility];  Service: Orthopedics;  Laterality: Left;   SPINE SURGERY  2008   Dr Leeann   TUBAL LIGATION     Past Surgical History:  Procedure Laterality Date   ANTERIOR CERVICAL DECOMP/DISCECTOMY FUSION N/A 07/08/2022   Procedure: C4-7 ANTERIOR CERVICAL DISCECTOMY AND FUSION (GLOBUS HEDRON);  Surgeon: Clois Fret, MD;  Location: [facility];  Service: Neurosurgery;  Laterality: N/A;   BREAST BIOPSY Right 07/2011   invasive ductal carcinoma   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 RAd and chemo tx   BREAST SURGERY     mastectomy Right    partial with lymph node dissection   PORT A CATH REVISION     PORT-A-CATH REMOVAL  11/07/2015   Dr Dessa   RHINOPLASTY Bilateral 01/09/2024   SHOULDER ARTHROSCOPY WITH ROTATOR CUFF REPAIR Left 05/18/2019   Procedure: SHOULDER ARTHROSCOPY WITH SUBACROMIAL DECOMPRESSION AND DISTAL CLAVICAL EXCISION, INTRAARTICULAR DEBRIDEMENT;  Surgeon: Leora Lynwood SAUNDERS, MD;  Location: [facility];  Service: Orthopedics;  Laterality: Left;   SPINE SURGERY  2008   Dr Leeann   TUBAL LIGATION     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   Personal history of chemotherapy 2013   for breast ca   Personal history of radiation therapy 2013   F/U right breast cancer    Pneumonia    Radiation 2013   BREAST CA   Status post chemotherapy 2013   BREAST CA   Wt 158 lb (71.7 kg)   BMI  27.99 kg/m   Opioid Risk Score:   Fall Risk Score:  `1  Depression screen [facility] 2/9     06/07/2024    9:08 AM 05/09/2024   10:03 AM 04/11/2024    8:48 AM 03/14/2024    9:17 AM 02/16/2024    8:43 AM 12/22/2023    2:14 PM 10/20/2023    8:38 AM  Depression screen PHQ 2/9  Decreased Interest 0 0 0 0 0 0 0  Down, Depressed, Hopeless 0 0 0 0 0 0 0  PHQ - 2 Score 0 0 0 0 0 0 0  Altered sleeping  0   0 0   Tired, decreased energy  0   0 0   Change in appetite  0   0 0   Feeling bad or failure about yourself   0   0 0   Trouble concentrating  0   0 0   Moving slowly or fidgety/restless  0   0 0   Suicidal thoughts  0   0 0   PHQ-9 Score  0   0 0   Difficult doing work/chores  Somewhat difficult    Not difficult at all     Review of Systems  Musculoskeletal:  Positive for back pain.       Pain in both legs down to both feet, right elbow pain  All other systems reviewed and are negative.      Objective:   Physical Exam Vitals and nursing note reviewed.  Constitutional:      Appearance: Normal appearance.  Cardiovascular:     Rate and Rhythm: Normal rate and regular rhythm.     Pulses: Normal pulses.     Heart sounds: Normal heart sounds.  Pulmonary:     Effort: Pulmonary effort is normal.     Breath sounds: Normal breath sounds.  Musculoskeletal:     Comments: Normal Muscle Bulk and Muscle Testing Reveals:  Upper Extremities: Full ROM and Muscle Strength 5/5  Lower Extremities: Full ROM and Muscle Strength 5/5 Arises from Table with ease Narrow Based  Gait     Skin:    General: Skin is warm and dry.  Neurological:     Mental Status: She is alert and oriented to person, place, and time.  Psychiatric:        Mood and Affect: Mood normal.        Behavior: Behavior normal.          Assessment & Plan:  1. Lumbar postlaminectomy syndrome status post L5-S1 fusion with chronic S1 radiculopathy. Continue current medication regimen with Nortriptyline . 06/07/2024. Refilled :   Hydrocodone  10/325 mg one tablet 5 times a day as needed for pain #150 06/07/2024. We will continue the opioid monitoring program, this consists of regular clinic visits, examinations, urine drug screen, pill counts as well as use of [place]  Controlled Substance Reporting system. A 12 month History has been reviewed on the [place]  Controlled Substance Reporting System on 06/07/2024. 2. 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 06/07/2024 3. Insomnia: Continue current medication regimen Ambien . 06/07/2024  4.Chronic Left Shoulder Pain: No complaints today. S/P Left Shoulder Arthroscopy with Rotator Cuff Repair on 05/18/2019 by Dr. Gregg : Ortho Following. 06/07/2024 5. Cervicalgia/ Cervical Radiculitis: Continue Pamelor .S/P on 07/08/2022. with Dr Clois:  C4-7 ANTERIOR CERVICAL DISCECTOMY AND FUSION (GLOBUS HEDRON) N/A General   We will continue to monitor.  06/07/2024   F/U in 1 month.

## 2024-06-27 NOTE — Progress Notes (Signed)
 Subjective:    Patient ID: Elizabeth Torres, female    DOB: [birth year], 48 y.o.   MRN: [id number]  HPI: Elizabeth Torres is a 48 y.o. female who is scheduled for Virtual visit today, due to her work schedule. I connected with  Elizabeth Torres  by video and verified that I am speaking with the correct person using two identifiers.  Location: Patient: In her home  Provider: In the Office    I discussed the limitations, risks, security and privacy concerns of performing an evaluation and management service by telephone and the availability of in person appointments. I also discussed with the patient that there may be a patient responsible charge related to this service. The patient expressed understanding and agreed to proceed.   She states her pain is located in her lower back radiating into her bilateral lower extremities. She rates her pain 3. Her current exercise regime is walking and performing stretching exercises.  Elizabeth Torres Morphine  equivalent is 50.00 MME.        Pain Inventory Average Pain 3 Pain Right Now 3 My pain is constant and aching  In the last 24 hours, has pain interfered with the following? General activity 3 Relation with others 3 Enjoyment of life 3 What TIME of day is your pain at its worst? evening Sleep (in general) Fair  Pain is worse with: standing Pain improves with: rest and medication Relief from Meds: 7  Family History  Problem Relation Age of Onset   Diabetes Mother    Hypertension Mother    Pulmonary fibrosis Mother    Heart disease Father    Heart attack Father    Other Father        unknown medical history   Peripheral Artery Disease Father    Asthma Brother    Breast cancer Paternal Aunt        40'S   Cancer Paternal Uncle    Breast cancer Paternal Uncle        57'S   Social History   Socioeconomic History   Marital status: Married    Spouse name: Elizabeth Torres   Number of children: 3   Years of education: Not on file   Highest education  level: Associate degree: occupational, scientist, product/process development, or vocational program  Occupational History   Not on file  Tobacco Use   Smoking status: Every Day    Current packs/day: 1.00    Average packs/day: 1 pack/day for 31.9 years (31.9 ttl pk-yrs)    Types: Cigarettes    Start date: 1994   Smokeless tobacco: Never  Vaping Use   Vaping status: Never Used  Substance and Sexual Activity   Alcohol  use: Not Currently   Drug use: Never   Sexual activity: Yes    Partners: Male    Comment: ablasion  Other Topics Concern   Not on file  Social History Narrative   Not on file   Social Drivers of Health   Financial Resource Strain: Low Risk  (04/11/2024)   Overall Financial Resource Strain (CARDIA)    Difficulty of Paying Living Expenses: Not hard at all  Food Insecurity: No Food Insecurity (04/11/2024)   Hunger Vital Sign    Worried About Running Out of Food in the Last Year: Never true    Ran Out of Food in the Last Year: Never true  Transportation Needs: No Transportation Needs (04/11/2024)   PRAPARE - Administrator, Civil Service (Medical): No  Lack of Transportation (Non-Medical): No  Physical Activity: Sufficiently Active (04/11/2024)   Exercise Vital Sign    Days of Exercise per Week: 5 days    Minutes of Exercise per Session: 30 min  Stress: Stress Concern Present (04/11/2024)   [employer] of Occupational Health - Occupational Stress Questionnaire    Feeling of Stress: Very much  Social Connections: Moderately Isolated (04/11/2024)   Social Connection and Isolation Panel    Frequency of Communication with Friends and Family: More than three times a week    Frequency of Social Gatherings with Friends and Family: More than three times a week    Attends Religious Services: Never    Database Administrator or Organizations: No    Attends Engineer, Structural: Not on file    Marital Status: Married   Past Surgical History:  Procedure Laterality Date   ANTERIOR  CERVICAL DECOMP/DISCECTOMY FUSION N/A 07/08/2022   Procedure: C4-7 ANTERIOR CERVICAL DISCECTOMY AND FUSION (GLOBUS HEDRON);  Surgeon: Clois Fret, MD;  Location: [facility];  Service: Neurosurgery;  Laterality: N/A;   BREAST BIOPSY Right 07/2011   invasive ductal carcinoma   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 RAd and chemo tx   BREAST SURGERY     mastectomy Right    partial with lymph node dissection   PORT A CATH REVISION     PORT-A-CATH REMOVAL  11/07/2015   Dr Dessa   RHINOPLASTY Bilateral 01/09/2024   SHOULDER ARTHROSCOPY WITH ROTATOR CUFF REPAIR Left 05/18/2019   Procedure: SHOULDER ARTHROSCOPY WITH SUBACROMIAL DECOMPRESSION AND DISTAL CLAVICAL EXCISION, INTRAARTICULAR DEBRIDEMENT;  Surgeon: Leora Lynwood SAUNDERS, MD;  Location: [facility];  Service: Orthopedics;  Laterality: Left;   SPINE SURGERY  2008   Dr Leeann   TUBAL LIGATION     Past Surgical History:  Procedure Laterality Date   ANTERIOR CERVICAL DECOMP/DISCECTOMY FUSION N/A 07/08/2022   Procedure: C4-7 ANTERIOR CERVICAL DISCECTOMY AND FUSION (GLOBUS HEDRON);  Surgeon: Clois Fret, MD;  Location: [facility];  Service: Neurosurgery;  Laterality: N/A;   BREAST BIOPSY Right 07/2011   invasive ductal carcinoma   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 RAd and chemo tx   BREAST SURGERY     mastectomy Right    partial with lymph node dissection   PORT A CATH REVISION     PORT-A-CATH REMOVAL  11/07/2015   Dr Dessa   RHINOPLASTY Bilateral 01/09/2024   SHOULDER ARTHROSCOPY WITH ROTATOR CUFF REPAIR Left 05/18/2019   Procedure: SHOULDER ARTHROSCOPY WITH SUBACROMIAL DECOMPRESSION AND DISTAL CLAVICAL EXCISION, INTRAARTICULAR DEBRIDEMENT;  Surgeon: Leora Lynwood SAUNDERS, MD;  Location: [facility];  Service: Orthopedics;  Laterality: Left;   SPINE SURGERY  2008   Dr Leeann   TUBAL LIGATION     Past Medical History:   Diagnosis Date   BRCA negative    Breast cancer (HCC) 2013   RT LUMPECTOMY with chemo and rad tx   Degenerative disc disease, lumbar    Hypertension    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  Opioid Risk Score:   Fall Risk Score:  `  1  Depression screen [facility] 2/9     06/07/2024    9:08 AM 05/09/2024   10:03 AM 04/11/2024    8:48 AM 03/14/2024    9:17 AM 02/16/2024    8:43 AM 12/22/2023    2:14 PM 10/20/2023    8:38 AM  Depression screen PHQ 2/9  Decreased Interest 0 0 0 0 0 0 0  Down, Depressed, Hopeless 0 0 0 0 0 0 0  PHQ - 2 Score 0 0 0 0 0 0 0  Altered sleeping  0   0 0   Tired, decreased energy  0   0 0   Change in appetite  0   0 0   Feeling bad or failure about yourself   0   0 0   Trouble concentrating  0   0 0   Moving slowly or fidgety/restless  0   0 0   Suicidal thoughts  0   0 0   PHQ-9 Score  0    0  0    Difficult doing work/chores  Somewhat difficult    Not difficult at all      Data saved with a previous flowsheet row definition    Review of Systems  Musculoskeletal:  Positive for back pain.       Pain in both legs  All other systems reviewed and are negative.      Objective:   Physical Exam Vitals and nursing note reviewed.  Musculoskeletal:     Comments: No Physical Exam Performed: Virtual Visit           Assessment & Plan:  1. Lumbar postlaminectomy syndrome status post L5-S1 fusion with chronic S1 radiculopathy. Continue current medication regimen with Nortriptyline . 07/04/2024. Refilled :  Hydrocodone  10/325 mg one tablet 5 times a day as needed for pain #150 07/04/2024. We will continue the opioid monitoring program, this consists of regular clinic visits, examinations, urine drug  screen, pill counts as well as use of [place]  Controlled Substance Reporting system. A 12 month History has been reviewed on the [place]  Controlled Substance Reporting System on 07/04/2024. 2. 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 07/04/2024 3. Insomnia: Continue current medication regimen Ambien . 07/04/2024  4.Chronic Left Shoulder Pain: No complaints today. S/P Left Shoulder Arthroscopy with Rotator Cuff Repair on 05/18/2019 by Dr. Gregg : Ortho Following. 07/04/2024 5. Cervicalgia/ Cervical Radiculitis: Continue Pamelor .S/P on 07/08/2022. with Dr Clois:  C4-7 ANTERIOR CERVICAL DISCECTOMY AND FUSION (GLOBUS HEDRON) N/A General   We will continue to monitor.  07/04/2024   F/U in 1 month.  Virtual Visit Established Patient Location of Patient: in her home Location of provider: In the Office  Provider: 100 % Audio: No video Connection Patient: 100 % Video and Audio

## 2024-07-04 ENCOUNTER — Encounter: Payer: Self-pay | Admitting: Registered Nurse

## 2024-07-04 ENCOUNTER — Encounter: Attending: Physical Medicine and Rehabilitation | Admitting: Registered Nurse

## 2024-07-04 VITALS — Ht 63.0 in | Wt 155.0 lb

## 2024-07-04 DIAGNOSIS — Z5181 Encounter for therapeutic drug level monitoring: Secondary | ICD-10-CM | POA: Insufficient documentation

## 2024-07-04 DIAGNOSIS — G4700 Insomnia, unspecified: Secondary | ICD-10-CM | POA: Insufficient documentation

## 2024-07-04 DIAGNOSIS — T451X5A Adverse effect of antineoplastic and immunosuppressive drugs, initial encounter: Secondary | ICD-10-CM | POA: Insufficient documentation

## 2024-07-04 DIAGNOSIS — G894 Chronic pain syndrome: Secondary | ICD-10-CM | POA: Insufficient documentation

## 2024-07-04 DIAGNOSIS — M5416 Radiculopathy, lumbar region: Secondary | ICD-10-CM | POA: Insufficient documentation

## 2024-07-04 DIAGNOSIS — M961 Postlaminectomy syndrome, not elsewhere classified: Secondary | ICD-10-CM | POA: Diagnosis not present

## 2024-07-04 DIAGNOSIS — G620 Drug-induced polyneuropathy: Secondary | ICD-10-CM | POA: Insufficient documentation

## 2024-07-04 DIAGNOSIS — Z79891 Long term (current) use of opiate analgesic: Secondary | ICD-10-CM | POA: Insufficient documentation

## 2024-07-04 MED ORDER — ZOLPIDEM TARTRATE 10 MG PO TABS: 10.0000 mg | ORAL_TABLET | Freq: Every evening | ORAL | 2 refills | Status: AC | PRN

## 2024-07-04 MED ORDER — HYDROCODONE-ACETAMINOPHEN 10-325 MG PO TABS: 1.0000 | ORAL_TABLET | Freq: Every day | ORAL | 0 refills | Status: DC | PRN

## 2024-07-05 ENCOUNTER — Other Ambulatory Visit: Payer: Self-pay | Admitting: Student in an Organized Health Care Education/Training Program

## 2024-07-05 DIAGNOSIS — J4530 Mild persistent asthma, uncomplicated: Secondary | ICD-10-CM

## 2024-07-07 ENCOUNTER — Telehealth: Payer: Self-pay

## 2024-07-07 ENCOUNTER — Other Ambulatory Visit (HOSPITAL_COMMUNITY): Payer: Self-pay

## 2024-07-07 NOTE — Telephone Encounter (Signed)
*  Pulm  PA not needed, patient may need to fill 90 days at preferred pharmacy. Brand Name Symbicort  covered.

## 2024-07-10 ENCOUNTER — Ambulatory Visit: Admitting: Primary Care

## 2024-07-10 ENCOUNTER — Encounter: Payer: Self-pay | Admitting: Primary Care

## 2024-07-10 VITALS — BP 126/70 | HR 85 | Temp 97.4°F | Ht 63.0 in | Wt 156.2 lb

## 2024-07-10 DIAGNOSIS — J4530 Mild persistent asthma, uncomplicated: Secondary | ICD-10-CM | POA: Diagnosis not present

## 2024-07-10 DIAGNOSIS — F17200 Nicotine dependence, unspecified, uncomplicated: Secondary | ICD-10-CM | POA: Diagnosis not present

## 2024-07-10 DIAGNOSIS — R911 Solitary pulmonary nodule: Secondary | ICD-10-CM | POA: Diagnosis not present

## 2024-07-10 MED ORDER — TRELEGY ELLIPTA 200-62.5-25 MCG/ACT IN AEPB: 1.0000 | INHALATION_SPRAY | Freq: Every day | RESPIRATORY_TRACT | Status: DC

## 2024-07-10 NOTE — Patient Instructions (Signed)
  VISIT SUMMARY: Today, you came in for a follow-up and medication refill for your asthma. We discussed your ongoing symptoms, including daily shortness of breath and chest pain, and reviewed your current and past treatments. We also talked about your smoking history and efforts to quit, as well as the findings from your previous CT scan and pulmonary function tests.  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 Since Symbicort  hasn't been effective, we will try a two-week trial of Trelegy, which contains a different steroid and an additional medication to help open your airways. If Trelegy doesn't help, you can go back to using Symbicort . Remember to rinse your mouth after using the inhaler to prevent infections.  -SOLITARY PULMONARY NODULE: A solitary pulmonary nodule is a small, round growth in the lung that needs to be monitored. We have scheduled a repeat CT scan for March 2026 to check on this nodule.  -NICOTINE  DEPENDENCE, CURRENT SMOKER: Nicotine  dependence means you are addicted to nicotine , commonly from smoking. Continuing to smoke increases your risk of lung cancer, COPD, and heart disease. You are currently using Chantix  to help quit smoking. Keep using Chantix  and try to avoid triggers that make you want to smoke. We discussed strategies to manage stress and other triggers.  -CHRONIC PLEURITIC CHEST PAIN, MID-STERNAL: Chronic pleuritic chest pain is long-term pain in the chest that worsens with breathing. It might be due to silent reflux or other conditions. We will try over-the-counter famotidine  (Pepcid ) at bedtime for two to four weeks. If there's no improvement, we may refer you to a gastrointestinal specialist for further evaluation.  INSTRUCTIONS: 1. Try Trelegy for two weeks (hold Symbicort  while using); resume Symbicort  if Trelegy is  ineffective after 2 week sample  2. Rinse your mouth after using the inhaler. 3. Continue using Chantix  to help quit smoking and avoid triggers. 4. Take over-the-counter famotidine  (Pepcid ) at bedtime for two to four weeks. 5. Repeat CT scan and breathing test scheduled for March 2026. 6. If not better we may consider referral to GI for evaluation possible esophagitis due to chest/esophageal pain   Follow-up 6 months with Dr. Isadora in [place]

## 2024-07-10 NOTE — Progress Notes (Signed)
 @Patient  ID: Elizabeth Torres, female    DOB: 08-25-1975, 48 y.o.   MRN: [id number]  No chief complaint on file.   Referring provider: No ref. provider found  HPI: 48 year old female, current smoker. PMH significant for mild persistent asthma. Patient of Dr. Isadora, last seen on 06/28/23.   Previous [facility] pulmonary encounter:  #Mild persistent asthma without complication #Preserved Ratio Impaired Spirometry  Patient is presenting to follow up on shortness of breath as well as cough. There was also concern for possible ILD given early onset of gray hair and ILD in the family. She has underwent high resolution chest CT that does not show any signs of ILD. The CT does show airway thickening suggestive of bronchitis as well as emphysema. There was a small nodule (see below) that will require follow up. PFT's show an obstructive defect with a brisk response to bronchodilator challenge, with post bronchodilator FEV1/FVC ratio of 0.8. This is suggestive of asthma with potential PRISM . History cell counts do show significant eosinophilia, up to 1300 in 2013. Lung volumes show air trapping and there's a mild drop in DLCO.  Given all this, I will initiate the patient on Symbicort  for the management of asthma with a spacer device. I will also obtain an allergen panel to assess for t-helper cell type response. I will order repeat PFT's on follow up to assess for residual obstruction and need to increase ICS dose.  - budesonide -formoterol  (SYMBICORT ) 160-4.5 MCG/ACT inhaler; Inhale 2 puffs into the lungs in the morning and at bedtime.  Dispense: 3 each; Refill: 6 - Spacer/Aero-Holding Chambers (AEROCHAMBER MV) inhaler; Use as instructed  Dispense: 1 each; Refill: 0 - Allergen Panel (27) + IGE; Future  #Pulmonary nodule  Noted to have a small pulmonary nodule with mild cavitation on chest imaging that is suggestive of an infectious etiology. Will obtain repeat chest CT at the one year mark to ensure  improvement/resolution.  - CT CHEST WO CONTRAST; Future  #Tobacco dependence  History of tobacco use with findings of PRISM  and Asthma as above. Counseled the patient extensively regarding importance of smoking cessation. She has tried chantix  before and would like to try it again. Discussed how to initiate chantix  and side effects. Will send prescription to the pharmacy for both a starter and maintenance pack.  - varenicline  (CHANTIX  CONTINUING MONTH PAK) 1 MG tablet; Take 1 tablet (1 mg total) by mouth 2 (two) times daily.  Dispense: 60 tablet; Refill: 1 - Varenicline  Tartrate, Starter, (CHANTIX  STARTING MONTH PAK) 0.5 MG X 11 & 1 MG X 42 TBPK; Take 0.5 mg by mouth in the morning and at bedtime. Days 1-3: Take one 0.5 mg white pill each morning for 3 days, one week before quit date. Days 4-7: Increase to one 0.5 mg white pill twice a day in morning and evening for 4 days. On Day 8 (target quit date), increase to one 1 mg blue pill twice a day. Maintain this dose for a minimum of 3 months.  Dispense: 1 each; Refill: 0  07/10/2024- Interim hx   Family history of ILD, HRCT does not show any signs of ILD, CT did show some airway thickening suggestive of bronchitis as well as emphysema. Small nodule that will require follow-up. PFTs showed obstructive defect with BD response suggestive of asthma. History of eosinophilia up to 1300 in 2013. Lung volumes showed air trapping and mild drop in DLCO. Symbicort  started for management of asthma with spacer device. Ordered for Allergn panel  and repeat PFTs on follow-up to assess for residual obstruction and need to increase ICS dose.    Discussed the use of AI scribe software for clinical note transcription with the patient, who gave verbal consent to proceed.  History of Present Illness Elizabeth Torres is a 48 year old female with mild persistent asthma who presents for a medication refill and follow-up.  Her breathing has remained unchanged since her last  visit in November of the previous year, with no noticeable benefit from using Symbicort . She has gone several days without taking medication but noticed no difference in her symptoms. She experiences daily shortness of breath and describes that 'every breath hurts to breathe.' This pain has been present for several years, located in the mid-sternal area, extending up to the esophagus. She denies reflux, heartburn, dyspepsia, burping, or belching.  She has a history of smoking but quit for seven months before starting again. During the period she quit, she did not feel any improvement in her symptoms. She is currently on Chantix  to quit smoking again.  Past pulmonary function tests showed moderate obstruction with a bronchodilator response, improving from 67% to 80% post-albuterol . A HRCT scan from March of the current year showed airway thickening and a small pulmonary nodule requiring follow-up. No evidenced of ILD.   She has been on Symbicort  for about a year. Her eosinophil levels, which were elevated nine months ago, have returned to normal.   Allergies  Allergen Reactions   Penicillins Anaphylaxis and Other (See Comments)   Pregabalin  Other (See Comments)    Thoughts of self harm  Other Reaction(s): Not available  pregabalin    Sulfa Antibiotics Anaphylaxis and Other (See Comments)   Ondansetron  Other (See Comments)    migraines  Other Reaction(s): Not available  ondansetron    Other    Zofran  [Ondansetron  Hcl]     migraines    Immunization History  Administered Date(s) Administered   Pneumococcal Conjugate-13 08/14/2019   Pneumococcal Polysaccharide-23 10/27/2017   Tdap 10/27/2017    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   Personal history of chemotherapy 2013   for breast ca   Personal history  of radiation therapy 2013   F/U right breast cancer    Pneumonia    Radiation 2013   BREAST CA   Status post chemotherapy 2013   BREAST CA    Tobacco History: Social History   Tobacco Use  Smoking Status Every Day   Current packs/day: 1.00   Average packs/day: 1 pack/day for 31.9 years (31.9 ttl pk-yrs)   Types: Cigarettes   Start date: 1994  Smokeless Tobacco Never   Ready to quit: Not Answered Counseling given: Not Answered   Outpatient Medications Prior to Visit  Medication Sig Dispense Refill   aspirin  EC 81 MG tablet Take 1 tablet (81 mg total) by mouth daily. Swallow whole. 90 tablet 3   atorvastatin  (LIPITOR) 20 MG tablet TAKE ONE (1) TABLET BY MOUTH DAILY 90 tablet 3   benzonatate  (TESSALON ) 100 MG capsule Take 2 capsules (200 mg total) by mouth every 8 (eight) hours. 21 capsule 0   budesonide -formoterol  (SYMBICORT ) 160-4.5 MCG/ACT inhaler Inhale 2 puffs into the lungs 2 (two) times daily. Please schedule office visit before any future refills. 10.2 g 0  fluconazole  (DIFLUCAN ) 150 MG tablet Take  150 mg tablet po every 72 h x 3 doses, then take one dose weekly x 4 doses 7 tablet 0   fluticasone (FLONASE) 50 MCG/ACT nasal spray Place 2 sprays into both nostrils daily.     hydrochlorothiazide  (HYDRODIURIL ) 12.5 MG tablet TAKE (1) TABLET BY MOUTH EVERY DAY 90 tablet 2   HYDROcodone -acetaminophen  (NORCO) 10-325 MG tablet Take 1 tablet by mouth 5 (five) times daily as needed. 150 tablet 0   ipratropium (ATROVENT ) 0.06 % nasal spray Place 2 sprays into both nostrils 4 (four) times daily. 15 mL 12   methocarbamol  (ROBAXIN ) 500 MG tablet TAKE (1) TABLET BY MOUTH TWICE DAILY AS NEEDED FOR MUSCLE SPASM 60 tablet 1   nortriptyline  (PAMELOR ) 10 MG capsule TAKE (3) CAPSULES AT BEDTIME AS DIRECTED 270 capsule 1   nystatin  (MYCOSTATIN ) 100000 UNIT/ML suspension Take 5 mLs (500,000 Units total) by mouth 4 (four) times daily. 60 mL 0   Spacer/Aero-Holding Chambers (AEROCHAMBER MV)  inhaler Use as instructed 1 each 0   spironolactone (ALDACTONE) 100 MG tablet Take 100 mg by mouth daily.     varenicline  (CHANTIX  CONTINUING MONTH PAK) 1 MG tablet Take 1 tablet (1 mg total) by mouth 2 (two) times daily. 60 tablet 4   Varenicline  Tartrate, Starter, (CHANTIX  STARTING MONTH PAK) 0.5 MG X 11 & 1 MG X 42 TBPK Take 0.5 mg tablet by mouth 1x daily for 3 days, then increase to 0.5 mg tablet 2x daily for 4 days, then increase to 1 mg tablet 2x daily. 53 each 0   zolpidem  (AMBIEN ) 10 MG tablet Take 1 tablet (10 mg total) by mouth at bedtime as needed for sleep. 30 tablet 2   No facility-administered medications prior to visit.   Review of Systems  Review of Systems  Constitutional: Negative.   HENT: Negative.  Negative for congestion.   Respiratory:  Positive for shortness of breath. Negative for cough, chest tightness and wheezing.   Gastrointestinal:        Esophageal pain    Physical Exam  There were no vitals taken for this visit. Physical Exam Constitutional:      Appearance: Normal appearance. She is well-developed.  HENT:     Head: Normocephalic and atraumatic.     Mouth/Throat:     Mouth: Mucous membranes are moist.     Pharynx: Oropharynx is clear.  Eyes:     Pupils: Pupils are equal, round, and reactive to light.  Cardiovascular:     Rate and Rhythm: Normal rate and regular rhythm.     Heart sounds: Normal heart sounds. No murmur heard. Pulmonary:     Effort: Pulmonary effort is normal. No respiratory distress.     Breath sounds: Normal breath sounds. No wheezing or rhonchi.     Comments: CTA Musculoskeletal:        General: Normal range of motion.     Cervical back: Normal range of motion and neck supple.  Skin:    General: Skin is warm and dry.     Findings: No erythema or rash.  Neurological:     General: No focal deficit present.     Mental Status: She is alert and oriented to person, place, and time. Mental status is at baseline.  Psychiatric:         Mood and Affect: Mood normal.        Behavior: Behavior normal.        Thought Content: Thought content normal.  Judgment: Judgment normal.     Lab Results:  CBC    Component Value Date/Time   WBC 7.6 09/17/2023 1350   WBC 7.3 06/29/2022 0935   RBC 4.27 09/17/2023 1350   RBC 4.54 06/29/2022 0935   HGB 13.7 09/17/2023 1350   HGB 13.6 08/10/2011 1631   HCT 40.4 09/17/2023 1350   HCT 40.7 08/10/2011 1631   PLT 338 09/17/2023 1350   MCV 95 09/17/2023 1350   MCV 94 04/26/2012 0450   MCV 95.5 08/10/2011 1631   MCH 32.1 09/17/2023 1350   MCH 31.5 06/29/2022 0935   MCHC 33.9 09/17/2023 1350   MCHC 34.5 06/29/2022 0935   RDW 13.1 09/17/2023 1350   RDW 14.2 04/26/2012 0450   RDW 14.0 08/10/2011 1631   LYMPHSABS 1.7 09/17/2023 1350   LYMPHSABS 1.8 04/26/2012 0450   LYMPHSABS 3.3 08/10/2011 1631   MONOABS 0.8 10/09/2017 1005   MONOABS 0.6 04/26/2012 0450   MONOABS 0.6 08/10/2011 1631   EOSABS 0.2 09/17/2023 1350   EOSABS 1.3 (H) 04/26/2012 0450   BASOSABS 0.1 09/17/2023 1350   BASOSABS 0.1 04/26/2012 0450   BASOSABS 0.0 08/10/2011 1631    BMET    Component Value Date/Time   NA 137 09/17/2023 1350   NA 141 02/22/2012 1419   K 5.0 09/17/2023 1350   K 4.2 02/22/2012 1419   CL 98 09/17/2023 1350   CL 103 02/22/2012 1419   CO2 25 09/17/2023 1350   CO2 29 02/22/2012 1419   GLUCOSE 90 09/17/2023 1350   GLUCOSE 93 06/29/2022 0935   GLUCOSE 98 02/22/2012 1419   BUN 14 09/17/2023 1350   BUN 10 02/22/2012 1419   CREATININE 0.77 09/17/2023 1350   CREATININE 0.83 02/22/2012 1419   CALCIUM  9.9 09/17/2023 1350   CALCIUM  9.2 02/22/2012 1419   GFRNONAA >60 06/29/2022 0935   GFRNONAA >60 02/22/2012 1419   GFRAA 124 01/26/2020 0938   GFRAA >60 02/22/2012 1419    BNP No results found for: BNP  ProBNP No results found for: PROBNP  Imaging: No results found.   Assessment & Plan:   1. Pulmonary nodule (Primary) - CT Chest Wo Contrast; Future  2. Mild  persistent asthma without complication  3. Current smoker  Assessment and Plan Assessment & Plan Asthma with chronic obstructive pulmonary disease Mild persistent asthma with obstructive defect and bronchodilator response on PFTs. No improvement with Symbicort . Symptoms include dyspnea and  esophageal chest pain . Discussed potential benefits of switching to Trelegy, which includes a different steroid and a long acting muscarinic bronchodilator. Discussed risks of continued smoking, including lung cancer, COPD progression, and cardiovascular risks. Denies chest tightness, wheezing or cough.  - Scheduled breathing test for March 2026 - Provided sample of Trelegy 200mcg for two weeks trial - Resume Symbicort  if Trelegy is ineffective - Rinse mouth after inhaler use to prevent thrush  Solitary pulmonary nodule, follow-up planned Small pulmonary nodule identified on previous CT scan, requiring follow-up. - Ordered repeat CT scan for March 2026  Nicotine  dependence, current smoker Current smoker with a history of quitting for seven months. Discussed risks of continued smoking, including lung cancer, COPD progression, and cardiovascular risks. Encouraged to continue efforts to quit smoking, currently on Chantix . 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 No reflux symptoms reported, but silent reflux considered. Pain is not consistent with asthma.  Differential includes GERD and/or esophagitis. Discussed trial of famotidine  for potential reflux. - Try over-the-counter famotidine  (Pepcid ) at bedtime for two to four weeks - Will consider referral to GI for endoscopy if no improvement with famotidine  and Trelegy  I personally spent a total of 40 minutes in the care of the patient today including counseling and educating,  placing orders, documenting clinical information in the EHR, communicating results, and coordinating care.   Elizabeth LELON Ferrari, NP 07/10/2024

## 2024-07-18 NOTE — Telephone Encounter (Signed)
 Yes please discontinue Trelegy and re-send script for symbicort 

## 2024-07-18 NOTE — Telephone Encounter (Signed)
 Please advise

## 2024-07-19 MED ORDER — BUDESONIDE-FORMOTEROL FUMARATE 160-4.5 MCG/ACT IN AERO: 2.0000 | INHALATION_SPRAY | Freq: Two times a day (BID) | RESPIRATORY_TRACT | 5 refills | Status: AC

## 2024-07-21 ENCOUNTER — Other Ambulatory Visit: Payer: Self-pay | Admitting: Primary Care

## 2024-07-21 DIAGNOSIS — J4530 Mild persistent asthma, uncomplicated: Secondary | ICD-10-CM

## 2024-07-21 MED ORDER — BUDESONIDE-FORMOTEROL FUMARATE 160-4.5 MCG/ACT IN AERO: 2.0000 | INHALATION_SPRAY | Freq: Two times a day (BID) | RESPIRATORY_TRACT | 2 refills | Status: DC

## 2024-07-21 NOTE — Addendum Note (Signed)
 Addended byBETHA JESSICA BOUCHARD S on: 07/21/2024 12:57 PM   Modules accepted: Orders

## 2024-07-21 NOTE — Telephone Encounter (Signed)
 Sent RX

## 2024-08-04 ENCOUNTER — Encounter: Attending: Physical Medicine and Rehabilitation | Admitting: Registered Nurse

## 2024-08-08 ENCOUNTER — Encounter: Payer: Self-pay | Admitting: Registered Nurse

## 2024-08-08 ENCOUNTER — Encounter: Attending: Physical Medicine and Rehabilitation | Admitting: Registered Nurse

## 2024-08-08 VITALS — BP 110/73 | HR 95 | Ht 63.0 in | Wt 157.6 lb

## 2024-08-08 DIAGNOSIS — G620 Drug-induced polyneuropathy: Secondary | ICD-10-CM | POA: Insufficient documentation

## 2024-08-08 DIAGNOSIS — Z5181 Encounter for therapeutic drug level monitoring: Secondary | ICD-10-CM | POA: Insufficient documentation

## 2024-08-08 DIAGNOSIS — M5416 Radiculopathy, lumbar region: Secondary | ICD-10-CM | POA: Insufficient documentation

## 2024-08-08 DIAGNOSIS — T451X5A Adverse effect of antineoplastic and immunosuppressive drugs, initial encounter: Secondary | ICD-10-CM | POA: Diagnosis not present

## 2024-08-08 DIAGNOSIS — Z79891 Long term (current) use of opiate analgesic: Secondary | ICD-10-CM | POA: Insufficient documentation

## 2024-08-08 DIAGNOSIS — M961 Postlaminectomy syndrome, not elsewhere classified: Secondary | ICD-10-CM | POA: Diagnosis not present

## 2024-08-08 DIAGNOSIS — G894 Chronic pain syndrome: Secondary | ICD-10-CM | POA: Insufficient documentation

## 2024-08-08 MED ORDER — HYDROCODONE-ACETAMINOPHEN 10-325 MG PO TABS: 1.0000 | ORAL_TABLET | Freq: Every day | ORAL | 0 refills | Status: AC | PRN

## 2024-08-08 MED ORDER — HYDROCODONE-ACETAMINOPHEN 10-325 MG PO TABS: 1.0000 | ORAL_TABLET | Freq: Every day | ORAL | 0 refills | Status: DC | PRN

## 2024-08-08 NOTE — Progress Notes (Signed)
 "  Subjective:    Patient ID: Elizabeth Torres, female    DOB: [birth year], 49 y.o.   MRN: [id number]  HPI: Elizabeth Torres is a 49 y.o. female who returns for follow up appointment for chronic pain and medication refill. She states her pain is located in her lower back radiating into her bilateral lower extremities. She rates her pain 4. Her current exercise regime is walking and performing stretching exercises.  [name] Morphine  equivalent is 50.00 MME.   Oral Swab was Performed today.    Pain Inventory Average Pain 4 Pain Right Now 4 My pain is constant and aching  In the last 24 hours, has pain interfered with the following? General activity 3 Relation with others 3 Enjoyment of life 3 What TIME of day is your pain at its worst? evening Sleep (in general) Poor  Pain is worse with: standing and some activites Pain improves with: rest, heat/ice, and medication Relief from Meds: 5  Family History  Problem Relation Age of Onset   Diabetes Mother    Hypertension Mother    Pulmonary fibrosis Mother    Heart disease Father    Heart attack Father    Other Father        unknown medical history   Peripheral Artery Disease Father    Asthma Brother    Breast cancer Paternal Aunt        35'S   Cancer Paternal Uncle    Breast cancer Paternal Uncle        53'S   Social History   Socioeconomic History   Marital status: Married    Spouse name: Eddy Termine   Number of children: 3   Years of education: Not on file   Highest education level: Associate degree: occupational, scientist, product/process development, or vocational program  Occupational History   Not on file  Tobacco Use   Smoking status: Every Day    Current packs/day: 1.00    Average packs/day: 1 pack/day for 32.0 years (32.0 ttl pk-yrs)    Types: Cigarettes    Start date: 1994   Smokeless tobacco: Never   Tobacco comments:    Pt smokes 0.5 ppd. Ab, cma 07-10-24  Vaping Use   Vaping status: Never Used  Substance and Sexual Activity   Alcohol   use: Not Currently   Drug use: Never   Sexual activity: Yes    Partners: Male    Comment: ablasion  Other Topics Concern   Not on file  Social History Narrative   Not on file   Social Drivers of Health   Tobacco Use: High Risk (08/08/2024)   Patient History    Smoking Tobacco Use: Every Day    Smokeless Tobacco Use: Never    Passive Exposure: Not on file  Financial Resource Strain: Low Risk (04/11/2024)   Overall Financial Resource Strain (CARDIA)    Difficulty of Paying Living Expenses: Not hard at all  Food Insecurity: No Food Insecurity (04/11/2024)   Epic    Worried About Radiation Protection Practitioner of Food in the Last Year: Never true    Ran Out of Food in the Last Year: Never true  Transportation Needs: No Transportation Needs (04/11/2024)   Epic    Lack of Transportation (Medical): No    Lack of Transportation (Non-Medical): No  Physical Activity: Sufficiently Active (04/11/2024)   Exercise Vital Sign    Days of Exercise per Week: 5 days    Minutes of Exercise per Session: 30 min  Stress: Stress Concern  Present (04/11/2024)   [employer] of Occupational Health - Occupational Stress Questionnaire    Feeling of Stress: Very much  Social Connections: Moderately Isolated (04/11/2024)   Social Connection and Isolation Panel    Frequency of Communication with Friends and Family: More than three times a week    Frequency of Social Gatherings with Friends and Family: More than three times a week    Attends Religious Services: Never    Database Administrator or Organizations: No    Attends Engineer, Structural: Not on file    Marital Status: Married  Depression (PHQ2-9): Low Risk (08/08/2024)   Depression (PHQ2-9)    PHQ-2 Score: 0  Alcohol  Screen: Low Risk (04/11/2024)   Alcohol  Screen    Last Alcohol  Screening Score (AUDIT): 0  Housing: Low Risk (04/11/2024)   Epic    Unable to Pay for Housing in the Last Year: No    Number of Times Moved in the Last Year: 0    Homeless in the Last  Year: No  Utilities: Not At Risk (04/11/2024)   Epic    Threatened with loss of utilities: No  Health Literacy: Adequate Health Literacy (04/11/2024)   B1300 Health Literacy    Frequency of need for help with medical instructions: Never   Past Surgical History:  Procedure Laterality Date   ANTERIOR CERVICAL DECOMP/DISCECTOMY FUSION N/A 07/08/2022   Procedure: C4-7 ANTERIOR CERVICAL DISCECTOMY AND FUSION (GLOBUS HEDRON);  Surgeon: Clois Fret, MD;  Location: [facility];  Service: Neurosurgery;  Laterality: N/A;   BREAST BIOPSY Right 07/2011   invasive ductal carcinoma   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 RAd and chemo tx   BREAST SURGERY     mastectomy Right    partial with lymph node dissection   PORT A CATH REVISION     PORT-A-CATH REMOVAL  11/07/2015   Dr Dessa   RHINOPLASTY Bilateral 01/09/2024   SHOULDER ARTHROSCOPY WITH ROTATOR CUFF REPAIR Left 05/18/2019   Procedure: SHOULDER ARTHROSCOPY WITH SUBACROMIAL DECOMPRESSION AND DISTAL CLAVICAL EXCISION, INTRAARTICULAR DEBRIDEMENT;  Surgeon: Leora Lynwood SAUNDERS, MD;  Location: [facility];  Service: Orthopedics;  Laterality: Left;   SPINE SURGERY  2008   Dr Leeann   TUBAL LIGATION     Past Surgical History:  Procedure Laterality Date   ANTERIOR CERVICAL DECOMP/DISCECTOMY FUSION N/A 07/08/2022   Procedure: C4-7 ANTERIOR CERVICAL DISCECTOMY AND FUSION (GLOBUS HEDRON);  Surgeon: Clois Fret, MD;  Location: [facility];  Service: Neurosurgery;  Laterality: N/A;   BREAST BIOPSY Right 07/2011   invasive ductal carcinoma   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 RAd and chemo tx   BREAST SURGERY     mastectomy Right    partial with lymph node dissection   PORT A CATH REVISION     PORT-A-CATH REMOVAL  11/07/2015   Dr Dessa   RHINOPLASTY Bilateral 01/09/2024   SHOULDER ARTHROSCOPY WITH ROTATOR CUFF REPAIR Left 05/18/2019    Procedure: SHOULDER ARTHROSCOPY WITH SUBACROMIAL DECOMPRESSION AND DISTAL CLAVICAL EXCISION, INTRAARTICULAR DEBRIDEMENT;  Surgeon: Leora Lynwood SAUNDERS, MD;  Location: [facility];  Service: Orthopedics;  Laterality: Left;   SPINE SURGERY  2008   Dr Leeann   TUBAL LIGATION     Past Medical History:  Diagnosis Date   BRCA negative    Breast cancer (HCC) 2013   RT LUMPECTOMY with chemo and rad tx   Degenerative disc disease, lumbar  Hypertension    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   Personal history of chemotherapy 2013   for breast ca   Personal history of radiation therapy 2013   F/U right breast cancer    Pneumonia    Radiation 2013   BREAST CA   Status post chemotherapy 2013   BREAST CA   BP 110/73 (BP Location: Left Arm, Patient Position: Sitting, Cuff Size: Normal)   Pulse 95   Ht 5' 3 (1.6 m)   Wt 157 lb 9.6 oz (71.5 kg)   SpO2 97%   BMI 27.92 kg/m   Opioid Risk Score:   Fall Risk Score:  `1  Depression screen [facility] 2/9     08/08/2024    8:57 AM 07/04/2024    9:53 AM 06/07/2024    9:08 AM 05/09/2024   10:03 AM 04/11/2024    8:48 AM 03/14/2024    9:17 AM 02/16/2024    8:43 AM  Depression screen PHQ 2/9  Decreased Interest 0 0 0 0 0 0 0  Down, Depressed, Hopeless 0 0 0 0 0 0 0  PHQ - 2 Score 0 0 0 0 0 0 0  Altered sleeping    0   0  Tired, decreased energy    0   0  Change in appetite    0   0  Feeling bad or failure about yourself     0   0  Trouble concentrating    0   0  Moving slowly or fidgety/restless    0   0  Suicidal thoughts    0   0  PHQ-9 Score    0    0   Difficult doing work/chores    Somewhat difficult        Data saved with a previous flowsheet row definition      Review of Systems  Musculoskeletal:  Positive for back pain, myalgias and neck pain.       Low back pain, bilateral leg pain, neck pain  All other systems reviewed and are negative.      Objective:   Physical Exam Vitals and nursing note reviewed.  Constitutional:       Appearance: Normal appearance.  Cardiovascular:     Rate and Rhythm: Normal rate and regular rhythm.     Pulses: Normal pulses.     Heart sounds: Normal heart sounds.  Pulmonary:     Effort: Pulmonary effort is normal.     Breath sounds: Normal breath sounds.  Musculoskeletal:     Comments: Normal Muscle Bulk and Muscle Testing Reveals:  Upper Extremities: Full ROM and Muscle Strength 5/5  Lumbar Paraspinal Tenderness: L-4-L-5 Lower Extremities: Full ROM and Muscle Strength 5/5 Arises from chair with ease Narrow Based  Gait     Skin:    General: Skin is warm and dry.  Neurological:     Mental Status: She is alert and oriented to person, place, and time.  Psychiatric:        Mood and Affect: Mood normal.        Behavior: Behavior normal.          Assessment & Plan:  1. Lumbar postlaminectomy syndrome status post L5-S1 fusion with chronic S1 radiculopathy. Continue current medication regimen with Nortriptyline . 08/08/2024. Refilled :  Hydrocodone  10/325 mg one tablet 5 times a day as needed for pain #150 Second script sent for thje following month. 08/08/2024. We will continue the opioid monitoring program, this consists  of regular clinic visits, examinations, urine drug screen, pill counts as well as use of [place]  Controlled Substance Reporting system. A 12 month History has been reviewed on the [place]  Controlled Substance Reporting System on 08/08/2024. 2. 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 08/08/2024 3. Insomnia: Continue current medication regimen Ambien . 08/08/2024  4.Chronic Left Shoulder Pain: No complaints today. S/P Left Shoulder Arthroscopy with Rotator Cuff Repair on 05/18/2019 by Dr. Gregg : Ortho Following. 08/08/2024 5. Cervicalgia/ Cervical Radiculitis: Continue Pamelor .S/P on 07/08/2022. with Dr Clois:  C4-7 ANTERIOR CERVICAL DISCECTOMY AND FUSION  (GLOBUS HEDRON) N/A General   We will continue to monitor.  08/08/2024   F/U in 2 months , due to her Job scheduled, she will be allowed to come every 2 months. SABRA        "

## 2024-08-10 LAB — DRUG TOX ALC METAB W/CON, ORAL FLD: Alcohol Metabolite: NEGATIVE ng/mL

## 2024-08-10 LAB — DRUG TOX MONITOR 1 W/CONF, ORAL FLD
Amphetamines: NEGATIVE ng/mL
Barbiturates: NEGATIVE ng/mL
Benzodiazepines: NEGATIVE ng/mL
Buprenorphine: NEGATIVE ng/mL
Cocaine: NEGATIVE ng/mL
Codeine: NEGATIVE ng/mL
Cotinine: 235.4 ng/mL — ABNORMAL HIGH
Dihydrocodeine: 47.4 ng/mL — ABNORMAL HIGH
Fentanyl: NEGATIVE ng/mL
Heroin Metabolite: NEGATIVE ng/mL
Hydrocodone: >250 ng/mL — ABNORMAL HIGH
Hydromorphone: NEGATIVE ng/mL
MARIJUANA: NEGATIVE ng/mL
MDMA: NEGATIVE ng/mL
Meprobamate: NEGATIVE ng/mL
Methadone: NEGATIVE ng/mL
Morphine: NEGATIVE ng/mL
Nicotine Metabolite: POSITIVE ng/mL — AB
Norhydrocodone: 38.4 ng/mL — ABNORMAL HIGH
Noroxycodone: NEGATIVE ng/mL
Opiates: POSITIVE ng/mL — AB
Oxycodone: NEGATIVE ng/mL
Oxymorphone: NEGATIVE ng/mL
Phencyclidine: NEGATIVE ng/mL
Tapentadol: NEGATIVE ng/mL
Tramadol: NEGATIVE ng/mL
Zolpidem: NEGATIVE ng/mL

## 2024-08-15 ENCOUNTER — Other Ambulatory Visit: Payer: Self-pay | Admitting: Cardiovascular Disease

## 2024-09-07 ENCOUNTER — Other Ambulatory Visit: Payer: Self-pay | Admitting: Cardiovascular Disease

## 2024-09-07 ENCOUNTER — Ambulatory Visit: Payer: Self-pay

## 2024-09-07 ENCOUNTER — Ambulatory Visit: Admitting: Internal Medicine

## 2024-09-07 NOTE — Telephone Encounter (Signed)
 FYI Only or Action Required?: FYI only for provider: Appt this afternoon.  Patient was last seen in primary care on 04/11/2024 by Leavy Mole, PA-C.  Called Nurse Triage reporting Muscle Pain.  Twitching in right arm - muscle cramps  Symptoms began several months ago.  Interventions attempted: Nothing.  Symptoms are: gradually worsening.  Triage Disposition: See HCP Within 4 Hours (Or PCP Triage)  Patient/caregiver understands and will follow disposition?: Yes                       Reason for Triage: severe cramps in muscle in arm. Cramps in arms, neck  Reason for Disposition  [1] SEVERE pain AND [2] taking a statin medicine (a lipid or cholesterol lowering drug)  Protocols used: Muscle Aches and Body Pain-A-AH

## 2024-09-07 NOTE — Telephone Encounter (Signed)
 Pt calling to reschedule acute visit scheduled today with PCP at 3:20 pm. Pt wont get off work in time today. Rescheduled with different provider at home office tomorrow d/t pt needing an early morning appt and no availability with PCP before 10 am tomorrow.

## 2024-09-08 ENCOUNTER — Ambulatory Visit: Admitting: Family Medicine

## 2024-09-08 ENCOUNTER — Encounter: Payer: Self-pay | Admitting: Family Medicine

## 2024-09-08 VITALS — BP 122/76 | HR 88 | Resp 16 | Ht 63.0 in | Wt 152.0 lb

## 2024-09-08 DIAGNOSIS — R252 Cramp and spasm: Secondary | ICD-10-CM

## 2024-09-08 DIAGNOSIS — E7800 Pure hypercholesterolemia, unspecified: Secondary | ICD-10-CM

## 2024-09-08 DIAGNOSIS — G620 Drug-induced polyneuropathy: Secondary | ICD-10-CM

## 2024-09-08 DIAGNOSIS — Z853 Personal history of malignant neoplasm of breast: Secondary | ICD-10-CM

## 2024-09-08 DIAGNOSIS — E538 Deficiency of other specified B group vitamins: Secondary | ICD-10-CM

## 2024-09-08 DIAGNOSIS — J4530 Mild persistent asthma, uncomplicated: Secondary | ICD-10-CM

## 2024-09-08 DIAGNOSIS — M2550 Pain in unspecified joint: Secondary | ICD-10-CM

## 2024-09-08 DIAGNOSIS — G8929 Other chronic pain: Secondary | ICD-10-CM

## 2024-09-08 DIAGNOSIS — M4722 Other spondylosis with radiculopathy, cervical region: Secondary | ICD-10-CM

## 2024-09-08 DIAGNOSIS — R7303 Prediabetes: Secondary | ICD-10-CM

## 2024-09-08 LAB — CBC WITH DIFFERENTIAL/PLATELET
Absolute Lymphocytes: 2146 {cells}/uL (ref 850–3900)
Absolute Monocytes: 451 {cells}/uL (ref 200–950)
Basophils Absolute: 37 {cells}/uL (ref 0–200)
Basophils Relative: 0.5 %
Eosinophils Absolute: 222 {cells}/uL (ref 15–500)
Eosinophils Relative: 3 %
HCT: 43.5 % (ref 35.9–46.0)
Hemoglobin: 14.2 g/dL (ref 11.7–15.5)
MCH: 30.6 pg (ref 27.0–33.0)
MCHC: 32.6 g/dL (ref 31.6–35.4)
MCV: 93.8 fL (ref 81.4–101.7)
MPV: 11 fL (ref 7.5–12.5)
Monocytes Relative: 6.1 %
Neutro Abs: 4544 {cells}/uL (ref 1500–7800)
Neutrophils Relative %: 61.4 %
Platelets: 282 10*3/uL (ref 140–400)
RBC: 4.64 Million/uL (ref 3.80–5.10)
RDW: 13.8 % (ref 11.0–15.0)
Total Lymphocyte: 29 %
WBC: 7.4 10*3/uL (ref 3.8–10.8)

## 2024-09-08 MED ORDER — ROSUVASTATIN CALCIUM 10 MG PO TABS: 10.0000 mg | ORAL_TABLET | Freq: Every day | ORAL | 0 refills | Status: AC

## 2024-09-08 NOTE — Assessment & Plan Note (Signed)
 Cervical spondylosis with radiculopathy. Discussed that right upper extremity muscle pains and cramping with patient described twitching and flutters could be s/t known cervical DDD. Hx of cervical spinal fusion . Last MRI cervical spine in 01/2024 showing mild bilateral C7 foraminal stenosis and mild non-compressive disc bulging at C3-C4.   -Advised follow up with spine specialist and pain management

## 2024-09-08 NOTE — Progress Notes (Signed)
 "  Acute Office Visit  Subjective:     Patient ID: Elizabeth Torres, female    DOB: July 10, 1976, 49 y.o.   MRN: [id number]  Chief Complaint  Patient presents with   Pain    Muscle cramps in multiple locations x1 year    HPI Patient is in today for complaints of muscle cramping for the last one year. Elizabeth Torres is a pleasant 49 year old female, a new patient to myself, seen in office due to muscle cramping, muscle pain and other joint pain. She describes a twitch in her arm and flutters in her bicep starting one year ago. Over the last one year she has began to experience what she describes as severe cramps affecting her arms, legs, neck and her throat.  She reports throat cramp was one time episode. She denies shortness of breath with what she describes as a cramp in her throat. She does endorse new choking episodes in the last couple of months, but since she began eating smaller bites she has not had frequent choking episodes.    Chart review today shows patient's chronic conditions include the following: hx of right breast cancer, chemo-induced neuropathy, asthma/COPD, pure hypercholesterolemia, 3 vessel coronary artery calcifications and aortic calcifications, hypertension, cervical spinal stenosis, hx cervical spinal fusion, cervical spondylosis with radiculopathy, post laminectomy syndrome involving lumbar region, chronic pain syndrome. Conditions managed by several specialists including the following: cardiology, pulmonology, physical medicine and rehabilitation/ pain management Will note previously followed by oncology, last seen in 01/2021. 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 Oncology notes patient was negative BRCA.    As noted she is here today due to muscle cramping, muscle pains, and other joint pain. Discussed symptoms could be multifactorial  etiology. She had started Spironolactone 100mg  daily, prescribed and managed by dermatology, 8 months ago for hair loss and continues hydrochlorothiazide  12.5mg  prescribed by prior PCP for management of hypertension. Advised that we discontinue hydrochlorothiazide  today, discussing that diuretic use could contribute to muscle cramping and pains as described. She is receptive.  As noted, follows with cardiology who prescribes Lipitor. She reports Lipitor was started November of 2024, last cardiolovy visit also November 2024. Discontinue Lipitor and start Rosuvastatin  10mg  nightly to see if this improves symptoms. Advising that statin therapy, especially Lipitor, can cause muscle pains and cramping. I did advise that she needs to remain established with cardiology, also advising to return to cardiology for follow up as it appears she is overdue for annual follow up. She is receptive.  As noted hx of chemo-induced neuropathy however she voices this pain is different. She still experiences tingling and neuropathy pains involving her feet but this is overall well managed with Nortriptyline .  Cervical spondylosis and chronic neck pain as noted, hx of cervical spinal fusion as noted. She reports she does see a spine specialist. Advised that she follow up with spine specialist regarding right upper arm twitching and flutters as described, as chronic neck condition could be contributing to pain as described involving the upper extremities. She is receptive.  Follows with physical medicine and rehabilitation now, last seen by Fidela Ned NP on 08/08/24, for chronic pain including low back pain radiating into lower extremities. Hx of lumbar spinal fusion . 08/08/24 she was given a prescription for Hydrocodone  10/325 to take one tablet 5 times a day as needed for pain, and plan  also included continuation of Nortriptyline . She takes Methocarbamol  as needed prescribed by Debby NP, as well as Ambien  prescribed by Debby NP.     LMP early last year , follows with GYN. Endometrial ablation in 2024.   Review of Systems  Musculoskeletal:  Positive for back pain, joint pain, myalgias and neck pain.  Neurological:  Positive for tingling.        Objective:    BP 122/76   Pulse 88   Resp 16   Ht 5' 3 (1.6 m)   Wt 152 lb (68.9 kg)   SpO2 99%   BMI 26.93 kg/m  BP Readings from Last 3 Encounters:  09/08/24 122/76  08/08/24 110/73  07/10/24 126/70   Wt Readings from Last 3 Encounters:  09/08/24 152 lb (68.9 kg)  08/08/24 157 lb 9.6 oz (71.5 kg)  07/10/24 156 lb 3.2 oz (70.9 kg)   SpO2 Readings from Last 3 Encounters:  09/08/24 99%  08/08/24 97%  07/10/24 99%      Last CBC Lab Results  Component Value Date   WBC 7.6 09/17/2023   HGB 13.7 09/17/2023   HCT 40.4 09/17/2023   MCV 95 09/17/2023   MCH 32.1 09/17/2023   RDW 13.1 09/17/2023   PLT 338 09/17/2023   Last metabolic panel Lab Results  Component Value Date   GLUCOSE 90 09/17/2023   NA 137 09/17/2023   K 5.0 09/17/2023   CL 98 09/17/2023   CO2 25 09/17/2023   BUN 14 09/17/2023   CREATININE 0.77 09/17/2023   EGFR 96 09/17/2023   CALCIUM  9.9 09/17/2023   PHOS 3.1 08/06/2023   PROT 6.7 09/17/2023   ALBUMIN 4.6 09/17/2023   LABGLOB 2.1 09/17/2023   AGRATIO 2.3 (H) 01/26/2020   BILITOT <0.2 09/17/2023   ALKPHOS 87 09/17/2023   AST 23 09/17/2023   ALT 26 09/17/2023   ANIONGAP 7 06/29/2022   Last lipids Lab Results  Component Value Date   CHOL 140 06/17/2023   HDL 77 06/17/2023   LDLCALC 51 06/17/2023   TRIG 53 06/17/2023   CHOLHDL 1.8 06/17/2023    Last thyroid  functions Lab Results  Component Value Date   TSH 1.570 09/17/2023   FREET4 1.19 09/17/2023       Physical Exam Constitutional:      Appearance: Normal appearance.  HENT:     Head: Normocephalic and atraumatic.  Cardiovascular:     Rate and Rhythm: Normal rate and regular rhythm.     Heart sounds: Normal heart sounds.  Pulmonary:     Effort:  Pulmonary effort is normal.     Breath sounds: Normal breath sounds.  Skin:    General: Skin is warm and dry.  Neurological:     General: No focal deficit present.     Mental Status: She is alert.  Psychiatric:        Mood and Affect: Mood normal.        Behavior: Behavior normal.       Assessment & Plan:   Assessment & Plan Muscle cramping Complaints of muscle cramping x 1 year. See HPI for additional details.   -Labs ordered, see below. Rule out vitamin deficiencies, electrolyte disturbances/imbalance, anemia, thyroid  disorder, autoimmune condition  -Stop hydrochlorothiazide  12.5mg  daily, continue Spironolactone 100mg  daily as prescribed by dermatology. Return in 1 month for f/u including BP follow up -Stop Lipitor 20mg  daily, start Rosuvastatin  10mg  daily  -Return to pain management and spine specialist for further evaluation  Orders:   CBC w/Diff/Platelet  Comprehensive Metabolic Panel (CMET)   Magnesium    TSH   Antinuclear Antib (ANA)   Vitamin D  (25 hydroxy)  Chronic joint pain Chronic joint pain involving several joints. See HPI for additional details   -Labs ordered, see below. Rule out thyroid  disorder, vitamin d  deficiency or autoimmune conditions  -Advised to maintain follow up with pain management and spine specialist  Orders:   TSH   Antinuclear Antib (ANA)   Vitamin D  (25 hydroxy)  B12 deficiency B12 deficiency previously uncontrolled, last B12 level 204 in 01/2024  -Update labs  Orders:   CBC w/Diff/Platelet   B12 and Folate Panel  Prediabetes Patient is prediabetic. Their last A1c was 5.9 in 01/2024 .  Patient reports she was unaware that she was prediabetic.   -Update labs  -Return in 1 month for f/u and further discussion   Orders:   Comprehensive Metabolic Panel (CMET)  Pure hypercholesterolemia Pure hypercholesterolemia is controlled , last LDL is 51 in 06/2023 .  Patient reports adherence  to medication regimen. Patient reports they are  not tolerating medications well . Muscle cramping and pain as described.  Patient denies chest pain or palpitations.    -Labs are up to date -Stop Lipitor 20mg  daily, start Rosuvastatin  10 mg nightly.  -Advised to schedule follow up with cardiology.  -Discussed importance of lifestyle modifications including dietary changes and regular exercise .  -Advised to refrain from smoking/tobacco usage    Orders:   Comprehensive Metabolic Panel (CMET)   rosuvastatin  (CRESTOR ) 10 MG tablet; Take 1 tablet (10 mg total) by mouth daily.  Chemotherapy-induced neuropathy Hx of right breast cancer with chemotherapy treatment in 2013. 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  Managed with Nortriptyline  10 mg daily prescribed by physical medicine/ rehabilitation.  Last visit on 08/08/24  -Advised to maintain follow up with physical medicine/ rehabilitation.    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 Discussed that right upper extremity muscle pains and cramping with patient described twitching and flutters could be s/t known cervical DDD. Hx of cervical spinal fusion . Last MRI cervical spine in 01/2024 showing mild bilateral C7 foraminal stenosis and mild non-compressive disc bulging at C3-C4.   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 Oncology notes patient was negative BRCA.     Mild persistent asthma, unspecified whether complicated Mild persistent asthma managed by pulmonology, last pulmonology visit on 07/10/24. Continues Symbicort  as prescribed by pulmonology.  -Advised to maintain pulmonology follow up, defer further management of asthma to pulmonology       Return in about 1  month (around 10/06/2024) for follow up on muscle cramping and joint pain .  LAYMON LOISE CORE, FNP  "

## 2024-09-08 NOTE — Assessment & Plan Note (Signed)
 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 Oncology notes patient was negative BRCA.

## 2024-09-08 NOTE — Assessment & Plan Note (Signed)
 Hx of right breast cancer with chemotherapy treatment in 2013. 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  Managed with Nortriptyline  10 mg daily prescribed by physical medicine/ rehabilitation.  Last visit on 08/08/24  -Advised to maintain follow up with physical medicine/ rehabilitation.

## 2024-10-03 ENCOUNTER — Encounter: Attending: Physical Medicine and Rehabilitation | Admitting: Registered Nurse

## 2024-10-06 ENCOUNTER — Ambulatory Visit: Admitting: Family Medicine

## 2024-10-10 ENCOUNTER — Ambulatory Visit: Admitting: Family Medicine
